# Patient Record
Sex: Female | Born: 1945
Health system: Southern US, Community
[De-identification: ages and names within clinical notes are randomized; demographics above are authoritative.]

## PROBLEM LIST (undated history)

## (undated) ENCOUNTER — Inpatient Hospital Stay: Payer: Self-pay

## (undated) DIAGNOSIS — M1710 Unilateral primary osteoarthritis, unspecified knee: Secondary | ICD-10-CM

## (undated) DIAGNOSIS — T4145XA Adverse effect of unspecified anesthetic, initial encounter: Secondary | ICD-10-CM

## (undated) DIAGNOSIS — J45909 Unspecified asthma, uncomplicated: Secondary | ICD-10-CM

## (undated) DIAGNOSIS — F3289 Other specified depressive episodes: Secondary | ICD-10-CM

## (undated) DIAGNOSIS — Z8719 Personal history of other diseases of the digestive system: Secondary | ICD-10-CM

## (undated) DIAGNOSIS — E785 Hyperlipidemia, unspecified: Secondary | ICD-10-CM

## (undated) DIAGNOSIS — F419 Anxiety disorder, unspecified: Secondary | ICD-10-CM

## (undated) DIAGNOSIS — K279 Peptic ulcer, site unspecified, unspecified as acute or chronic, without hemorrhage or perforation: Secondary | ICD-10-CM

## (undated) DIAGNOSIS — C801 Malignant (primary) neoplasm, unspecified: Secondary | ICD-10-CM

## (undated) DIAGNOSIS — M19049 Primary osteoarthritis, unspecified hand: Secondary | ICD-10-CM

## (undated) DIAGNOSIS — IMO0001 Reserved for inherently not codable concepts without codable children: Secondary | ICD-10-CM

## (undated) DIAGNOSIS — J309 Allergic rhinitis, unspecified: Secondary | ICD-10-CM

## (undated) DIAGNOSIS — H8110 Benign paroxysmal vertigo, unspecified ear: Secondary | ICD-10-CM

## (undated) DIAGNOSIS — I509 Heart failure, unspecified: Secondary | ICD-10-CM

## (undated) DIAGNOSIS — R7303 Prediabetes: Secondary | ICD-10-CM

## (undated) DIAGNOSIS — E119 Type 2 diabetes mellitus without complications: Secondary | ICD-10-CM

## (undated) DIAGNOSIS — K219 Gastro-esophageal reflux disease without esophagitis: Secondary | ICD-10-CM

## (undated) DIAGNOSIS — D649 Anemia, unspecified: Secondary | ICD-10-CM

## (undated) DIAGNOSIS — M26609 Unspecified temporomandibular joint disorder, unspecified side: Secondary | ICD-10-CM

## (undated) DIAGNOSIS — I10 Essential (primary) hypertension: Secondary | ICD-10-CM

## (undated) DIAGNOSIS — E039 Hypothyroidism, unspecified: Secondary | ICD-10-CM

## (undated) DIAGNOSIS — J189 Pneumonia, unspecified organism: Secondary | ICD-10-CM

## (undated) DIAGNOSIS — F329 Major depressive disorder, single episode, unspecified: Secondary | ICD-10-CM

## (undated) DIAGNOSIS — J383 Other diseases of vocal cords: Secondary | ICD-10-CM

## (undated) DIAGNOSIS — T8859XA Other complications of anesthesia, initial encounter: Secondary | ICD-10-CM

## (undated) HISTORY — PX: BREAST EXCISIONAL BIOPSY: SUR124

## (undated) HISTORY — PX: KNEE ARTHROSCOPY: SUR90

## (undated) HISTORY — DX: Benign paroxysmal vertigo, unspecified ear: H81.10

## (undated) HISTORY — DX: Gastro-esophageal reflux disease without esophagitis: K21.9

## (undated) HISTORY — DX: Unilateral primary osteoarthritis, unspecified knee: M17.10

## (undated) HISTORY — PX: NASAL SINUS SURGERY: SHX719

## (undated) HISTORY — PX: OTHER SURGICAL HISTORY: SHX169

## (undated) HISTORY — PX: ROTATOR CUFF REPAIR: SHX139

## (undated) HISTORY — DX: Hypothyroidism, unspecified: E03.9

## (undated) HISTORY — DX: Major depressive disorder, single episode, unspecified: F32.9

## (undated) HISTORY — DX: Primary osteoarthritis, unspecified hand: M19.049

## (undated) HISTORY — DX: Allergic rhinitis, unspecified: J30.9

## (undated) HISTORY — DX: Unspecified asthma, uncomplicated: J45.909

## (undated) HISTORY — DX: Other diseases of vocal cords: J38.3

## (undated) HISTORY — PX: BREAST SURGERY: SHX581

## (undated) HISTORY — DX: Peptic ulcer, site unspecified, unspecified as acute or chronic, without hemorrhage or perforation: K27.9

## (undated) HISTORY — DX: Other specified depressive episodes: F32.89

## (undated) HISTORY — DX: Hyperlipidemia, unspecified: E78.5

---
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
## 1997-07-05 ENCOUNTER — Other Ambulatory Visit: Admission: RE | Admit: 1997-07-05 | Discharge: 1997-07-05 | Payer: Self-pay | Admitting: Obstetrics and Gynecology

## 1997-08-02 ENCOUNTER — Other Ambulatory Visit: Admission: RE | Admit: 1997-08-02 | Discharge: 1997-08-02 | Payer: Self-pay | Admitting: Obstetrics and Gynecology

## 1998-06-08 ENCOUNTER — Ambulatory Visit (HOSPITAL_COMMUNITY): Admission: RE | Admit: 1998-06-08 | Discharge: 1998-06-08 | Payer: Self-pay | Admitting: *Deleted

## 1998-11-29 ENCOUNTER — Ambulatory Visit (HOSPITAL_COMMUNITY): Admission: RE | Admit: 1998-11-29 | Discharge: 1998-11-29 | Payer: Self-pay | Admitting: Obstetrics and Gynecology

## 1998-11-29 ENCOUNTER — Encounter: Payer: Self-pay | Admitting: Obstetrics and Gynecology

## 1999-08-07 ENCOUNTER — Encounter: Payer: Self-pay | Admitting: Obstetrics and Gynecology

## 1999-08-07 ENCOUNTER — Ambulatory Visit (HOSPITAL_COMMUNITY): Admission: RE | Admit: 1999-08-07 | Discharge: 1999-08-07 | Payer: Self-pay | Admitting: Obstetrics and Gynecology

## 1999-10-20 ENCOUNTER — Ambulatory Visit (HOSPITAL_COMMUNITY): Admission: RE | Admit: 1999-10-20 | Discharge: 1999-10-20 | Payer: Self-pay | Admitting: Gastroenterology

## 1999-12-26 ENCOUNTER — Ambulatory Visit (HOSPITAL_COMMUNITY): Admission: RE | Admit: 1999-12-26 | Discharge: 1999-12-26 | Payer: Self-pay | Admitting: Gastroenterology

## 2000-08-26 ENCOUNTER — Ambulatory Visit (HOSPITAL_COMMUNITY): Admission: RE | Admit: 2000-08-26 | Discharge: 2000-08-26 | Payer: Self-pay | Admitting: Obstetrics and Gynecology

## 2000-08-26 ENCOUNTER — Encounter: Payer: Self-pay | Admitting: Obstetrics and Gynecology

## 2001-07-24 ENCOUNTER — Encounter: Admission: RE | Admit: 2001-07-24 | Discharge: 2001-07-24 | Payer: Self-pay | Admitting: Critical Care Medicine

## 2001-07-24 ENCOUNTER — Encounter: Payer: Self-pay | Admitting: Critical Care Medicine

## 2002-03-31 ENCOUNTER — Encounter: Payer: Self-pay | Admitting: Obstetrics and Gynecology

## 2002-03-31 ENCOUNTER — Ambulatory Visit (HOSPITAL_COMMUNITY): Admission: RE | Admit: 2002-03-31 | Discharge: 2002-03-31 | Payer: Self-pay | Admitting: Obstetrics and Gynecology

## 2002-04-03 ENCOUNTER — Encounter: Payer: Self-pay | Admitting: Obstetrics and Gynecology

## 2002-04-03 ENCOUNTER — Encounter: Admission: RE | Admit: 2002-04-03 | Discharge: 2002-04-03 | Payer: Self-pay | Admitting: Obstetrics and Gynecology

## 2002-05-19 ENCOUNTER — Encounter: Payer: Self-pay | Admitting: Internal Medicine

## 2002-06-15 ENCOUNTER — Encounter: Payer: Self-pay | Admitting: Internal Medicine

## 2002-06-15 ENCOUNTER — Encounter: Payer: Self-pay | Admitting: Cardiology

## 2003-04-13 ENCOUNTER — Ambulatory Visit (HOSPITAL_COMMUNITY): Admission: RE | Admit: 2003-04-13 | Discharge: 2003-04-13 | Payer: Self-pay | Admitting: Obstetrics and Gynecology

## 2004-01-17 ENCOUNTER — Ambulatory Visit: Payer: Self-pay | Admitting: Critical Care Medicine

## 2004-02-09 ENCOUNTER — Ambulatory Visit: Payer: Self-pay | Admitting: Critical Care Medicine

## 2004-02-24 ENCOUNTER — Ambulatory Visit: Payer: Self-pay | Admitting: Critical Care Medicine

## 2004-04-06 ENCOUNTER — Ambulatory Visit: Payer: Self-pay | Admitting: Critical Care Medicine

## 2004-04-26 ENCOUNTER — Ambulatory Visit: Payer: Self-pay | Admitting: Critical Care Medicine

## 2004-04-28 ENCOUNTER — Ambulatory Visit: Payer: Self-pay | Admitting: Internal Medicine

## 2004-05-09 ENCOUNTER — Ambulatory Visit: Payer: Self-pay | Admitting: Internal Medicine

## 2004-05-15 ENCOUNTER — Ambulatory Visit: Payer: Self-pay | Admitting: Internal Medicine

## 2004-08-02 ENCOUNTER — Ambulatory Visit: Payer: Self-pay | Admitting: Critical Care Medicine

## 2004-09-26 ENCOUNTER — Ambulatory Visit: Payer: Self-pay | Admitting: Critical Care Medicine

## 2004-11-08 ENCOUNTER — Ambulatory Visit (HOSPITAL_COMMUNITY): Admission: RE | Admit: 2004-11-08 | Discharge: 2004-11-08 | Payer: Self-pay | Admitting: Obstetrics and Gynecology

## 2004-11-09 ENCOUNTER — Ambulatory Visit: Payer: Self-pay | Admitting: Critical Care Medicine

## 2004-12-13 ENCOUNTER — Ambulatory Visit: Payer: Self-pay | Admitting: Critical Care Medicine

## 2004-12-15 ENCOUNTER — Encounter: Admission: RE | Admit: 2004-12-15 | Discharge: 2004-12-15 | Payer: Self-pay | Admitting: Internal Medicine

## 2004-12-28 ENCOUNTER — Ambulatory Visit: Payer: Self-pay | Admitting: Critical Care Medicine

## 2005-01-09 ENCOUNTER — Ambulatory Visit: Payer: Self-pay | Admitting: Pulmonary Disease

## 2005-01-22 ENCOUNTER — Ambulatory Visit: Payer: Self-pay | Admitting: Critical Care Medicine

## 2005-01-31 ENCOUNTER — Ambulatory Visit: Payer: Self-pay | Admitting: Critical Care Medicine

## 2005-02-16 ENCOUNTER — Ambulatory Visit: Payer: Self-pay | Admitting: Critical Care Medicine

## 2005-02-20 ENCOUNTER — Ambulatory Visit: Payer: Self-pay | Admitting: Cardiology

## 2005-04-11 ENCOUNTER — Ambulatory Visit: Payer: Self-pay | Admitting: Critical Care Medicine

## 2005-06-06 ENCOUNTER — Encounter: Payer: Self-pay | Admitting: Internal Medicine

## 2005-06-15 ENCOUNTER — Ambulatory Visit: Payer: Self-pay | Admitting: Critical Care Medicine

## 2005-06-22 ENCOUNTER — Encounter: Payer: Self-pay | Admitting: Internal Medicine

## 2005-06-27 ENCOUNTER — Encounter: Admission: RE | Admit: 2005-06-27 | Discharge: 2005-06-27 | Payer: Self-pay | Admitting: Internal Medicine

## 2005-06-28 ENCOUNTER — Ambulatory Visit: Payer: Self-pay | Admitting: Internal Medicine

## 2005-08-22 ENCOUNTER — Ambulatory Visit: Payer: Self-pay | Admitting: Critical Care Medicine

## 2005-08-31 ENCOUNTER — Ambulatory Visit: Payer: Self-pay | Admitting: Internal Medicine

## 2005-10-16 ENCOUNTER — Encounter: Payer: Self-pay | Admitting: Internal Medicine

## 2005-10-26 ENCOUNTER — Ambulatory Visit: Payer: Self-pay | Admitting: Critical Care Medicine

## 2005-11-07 ENCOUNTER — Ambulatory Visit: Payer: Self-pay | Admitting: Internal Medicine

## 2005-11-27 ENCOUNTER — Ambulatory Visit (HOSPITAL_COMMUNITY): Admission: RE | Admit: 2005-11-27 | Discharge: 2005-11-27 | Payer: Self-pay | Admitting: Obstetrics and Gynecology

## 2006-02-11 ENCOUNTER — Ambulatory Visit: Payer: Self-pay | Admitting: Internal Medicine

## 2006-03-09 ENCOUNTER — Ambulatory Visit: Payer: Self-pay | Admitting: Family Medicine

## 2006-03-12 ENCOUNTER — Ambulatory Visit: Payer: Self-pay | Admitting: Critical Care Medicine

## 2006-03-26 ENCOUNTER — Ambulatory Visit: Payer: Self-pay | Admitting: Critical Care Medicine

## 2006-05-06 ENCOUNTER — Encounter: Payer: Self-pay | Admitting: Internal Medicine

## 2006-05-10 ENCOUNTER — Encounter: Payer: Self-pay | Admitting: Internal Medicine

## 2006-06-17 ENCOUNTER — Ambulatory Visit: Payer: Self-pay | Admitting: Critical Care Medicine

## 2006-09-18 ENCOUNTER — Ambulatory Visit: Payer: Self-pay | Admitting: Internal Medicine

## 2006-09-18 LAB — CONVERTED CEMR LAB
ALT: 36 units/L — ABNORMAL HIGH (ref 0–35)
AST: 21 units/L (ref 0–37)
Albumin: 3.6 g/dL (ref 3.5–5.2)
Alkaline Phosphatase: 58 units/L (ref 39–117)
BUN: 16 mg/dL (ref 6–23)
Basophils Absolute: 0 10*3/uL (ref 0.0–0.1)
Basophils Relative: 0.9 % (ref 0.0–1.0)
Bilirubin Urine: NEGATIVE
Bilirubin, Direct: 0.1 mg/dL (ref 0.0–0.3)
Blood in Urine, dipstick: NEGATIVE
CO2: 31 meq/L (ref 19–32)
Calcium: 8.9 mg/dL (ref 8.4–10.5)
Chloride: 105 meq/L (ref 96–112)
Cholesterol: 223 mg/dL (ref 0–200)
Creatinine, Ser: 0.7 mg/dL (ref 0.4–1.2)
Direct LDL: 146.7 mg/dL
Eosinophils Absolute: 0.3 10*3/uL (ref 0.0–0.6)
Eosinophils Relative: 6 % — ABNORMAL HIGH (ref 0.0–5.0)
GFR calc Af Amer: 109 mL/min
GFR calc non Af Amer: 90 mL/min
Glucose, Bld: 99 mg/dL (ref 70–99)
Glucose, Urine, Semiquant: NEGATIVE
HCT: 37.6 % (ref 36.0–46.0)
HDL: 62.4 mg/dL (ref >39.0)
Hemoglobin: 13 g/dL (ref 12.0–15.0)
Ketones, urine, test strip: NEGATIVE
Lymphocytes Relative: 22.9 % (ref 12.0–46.0)
MCHC: 34.6 g/dL (ref 30.0–36.0)
MCV: 83.7 fL (ref 78.0–100.0)
Monocytes Absolute: 0.6 10*3/uL (ref 0.2–0.7)
Monocytes Relative: 12.5 % — ABNORMAL HIGH (ref 3.0–11.0)
Neutro Abs: 3.1 10*3/uL (ref 1.4–7.7)
Neutrophils Relative %: 57.7 % (ref 43.0–77.0)
Nitrite: NEGATIVE
Platelets: 321 10*3/uL (ref 150–400)
Potassium: 4.1 meq/L (ref 3.5–5.1)
RBC: 4.49 M/uL (ref 3.87–5.11)
RDW: 13.1 % (ref 11.5–14.6)
Sodium: 141 meq/L (ref 135–145)
Specific Gravity, Urine: 1.015
TSH: 0.53 microintl units/mL (ref 0.35–5.50)
Total Bilirubin: 0.7 mg/dL (ref 0.3–1.2)
Total CHOL/HDL Ratio: 3.6
Total Protein: 6.3 g/dL (ref 6.0–8.3)
Triglycerides: 82 mg/dL (ref 0–149)
Urobilinogen, UA: 0.2
VLDL: 16 mg/dL (ref 0–40)
WBC: 5.2 10*3/uL (ref 4.5–10.5)
pH: 8.5

## 2006-09-23 DIAGNOSIS — E785 Hyperlipidemia, unspecified: Secondary | ICD-10-CM

## 2006-09-24 ENCOUNTER — Ambulatory Visit: Payer: Self-pay | Admitting: Internal Medicine

## 2006-09-24 DIAGNOSIS — F329 Major depressive disorder, single episode, unspecified: Secondary | ICD-10-CM

## 2006-09-24 DIAGNOSIS — F330 Major depressive disorder, recurrent, mild: Secondary | ICD-10-CM | POA: Insufficient documentation

## 2006-09-24 DIAGNOSIS — H8110 Benign paroxysmal vertigo, unspecified ear: Secondary | ICD-10-CM

## 2006-09-24 DIAGNOSIS — E039 Hypothyroidism, unspecified: Secondary | ICD-10-CM

## 2006-09-24 DIAGNOSIS — K219 Gastro-esophageal reflux disease without esophagitis: Secondary | ICD-10-CM

## 2006-10-16 ENCOUNTER — Encounter: Admission: RE | Admit: 2006-10-16 | Discharge: 2007-01-14 | Payer: Self-pay | Admitting: Internal Medicine

## 2006-10-16 ENCOUNTER — Encounter: Payer: Self-pay | Admitting: Internal Medicine

## 2006-11-04 ENCOUNTER — Ambulatory Visit: Payer: Self-pay | Admitting: Critical Care Medicine

## 2006-11-05 DIAGNOSIS — K279 Peptic ulcer, site unspecified, unspecified as acute or chronic, without hemorrhage or perforation: Secondary | ICD-10-CM | POA: Insufficient documentation

## 2006-11-05 DIAGNOSIS — J309 Allergic rhinitis, unspecified: Secondary | ICD-10-CM

## 2006-11-05 DIAGNOSIS — J383 Other diseases of vocal cords: Secondary | ICD-10-CM

## 2006-11-05 DIAGNOSIS — J45909 Unspecified asthma, uncomplicated: Secondary | ICD-10-CM

## 2006-11-06 ENCOUNTER — Encounter: Payer: Self-pay | Admitting: Internal Medicine

## 2006-11-07 ENCOUNTER — Encounter: Payer: Self-pay | Admitting: Internal Medicine

## 2006-11-20 ENCOUNTER — Telehealth (INDEPENDENT_AMBULATORY_CARE_PROVIDER_SITE_OTHER): Payer: Self-pay | Admitting: *Deleted

## 2006-11-25 ENCOUNTER — Encounter: Payer: Self-pay | Admitting: Internal Medicine

## 2006-11-29 ENCOUNTER — Telehealth: Payer: Self-pay | Admitting: Internal Medicine

## 2006-12-03 ENCOUNTER — Ambulatory Visit: Payer: Self-pay | Admitting: Internal Medicine

## 2006-12-03 LAB — CONVERTED CEMR LAB
HCV Ab: NEGATIVE
Hep A IgM: NEGATIVE
Hep B C IgM: NEGATIVE
Hepatitis B Surface Ag: NEGATIVE

## 2006-12-05 ENCOUNTER — Telehealth: Payer: Self-pay | Admitting: *Deleted

## 2006-12-11 ENCOUNTER — Ambulatory Visit (HOSPITAL_COMMUNITY): Admission: RE | Admit: 2006-12-11 | Discharge: 2006-12-11 | Payer: Self-pay | Admitting: Obstetrics and Gynecology

## 2007-01-20 ENCOUNTER — Ambulatory Visit: Payer: Self-pay | Admitting: Critical Care Medicine

## 2007-02-03 ENCOUNTER — Telehealth: Payer: Self-pay | Admitting: Internal Medicine

## 2007-02-03 ENCOUNTER — Telehealth (INDEPENDENT_AMBULATORY_CARE_PROVIDER_SITE_OTHER): Payer: Self-pay | Admitting: *Deleted

## 2007-02-10 ENCOUNTER — Telehealth (INDEPENDENT_AMBULATORY_CARE_PROVIDER_SITE_OTHER): Payer: Self-pay | Admitting: *Deleted

## 2007-02-10 ENCOUNTER — Ambulatory Visit: Payer: Self-pay | Admitting: Pulmonary Disease

## 2007-02-17 ENCOUNTER — Telehealth (INDEPENDENT_AMBULATORY_CARE_PROVIDER_SITE_OTHER): Payer: Self-pay | Admitting: *Deleted

## 2007-02-17 ENCOUNTER — Ambulatory Visit: Payer: Self-pay | Admitting: Critical Care Medicine

## 2007-03-05 ENCOUNTER — Telehealth: Payer: Self-pay | Admitting: Internal Medicine

## 2007-03-11 ENCOUNTER — Encounter: Payer: Self-pay | Admitting: Internal Medicine

## 2007-03-19 ENCOUNTER — Ambulatory Visit: Payer: Self-pay | Admitting: Internal Medicine

## 2007-03-19 DIAGNOSIS — M1710 Unilateral primary osteoarthritis, unspecified knee: Secondary | ICD-10-CM | POA: Insufficient documentation

## 2007-04-12 ENCOUNTER — Ambulatory Visit: Payer: Self-pay | Admitting: Family Medicine

## 2007-05-06 ENCOUNTER — Ambulatory Visit: Payer: Self-pay | Admitting: Critical Care Medicine

## 2007-05-07 ENCOUNTER — Telehealth (INDEPENDENT_AMBULATORY_CARE_PROVIDER_SITE_OTHER): Payer: Self-pay | Admitting: *Deleted

## 2007-06-10 ENCOUNTER — Encounter: Payer: Self-pay | Admitting: Internal Medicine

## 2007-06-12 ENCOUNTER — Telehealth (INDEPENDENT_AMBULATORY_CARE_PROVIDER_SITE_OTHER): Payer: Self-pay | Admitting: *Deleted

## 2007-06-16 ENCOUNTER — Telehealth (INDEPENDENT_AMBULATORY_CARE_PROVIDER_SITE_OTHER): Payer: Self-pay | Admitting: *Deleted

## 2007-07-22 ENCOUNTER — Telehealth: Payer: Self-pay | Admitting: Internal Medicine

## 2007-08-12 ENCOUNTER — Ambulatory Visit: Payer: Self-pay | Admitting: Critical Care Medicine

## 2007-08-13 ENCOUNTER — Encounter: Payer: Self-pay | Admitting: Critical Care Medicine

## 2007-10-07 ENCOUNTER — Ambulatory Visit: Payer: Self-pay | Admitting: Critical Care Medicine

## 2007-12-02 ENCOUNTER — Ambulatory Visit: Payer: Self-pay | Admitting: Critical Care Medicine

## 2007-12-16 ENCOUNTER — Ambulatory Visit (HOSPITAL_COMMUNITY): Admission: RE | Admit: 2007-12-16 | Discharge: 2007-12-16 | Payer: Self-pay | Admitting: Obstetrics and Gynecology

## 2007-12-22 ENCOUNTER — Ambulatory Visit: Payer: Self-pay | Admitting: Internal Medicine

## 2007-12-22 DIAGNOSIS — M19049 Primary osteoarthritis, unspecified hand: Secondary | ICD-10-CM | POA: Insufficient documentation

## 2007-12-26 ENCOUNTER — Telehealth: Payer: Self-pay | Admitting: Internal Medicine

## 2007-12-26 LAB — CONVERTED CEMR LAB
CRP, High Sensitivity: 17 — ABNORMAL HIGH (ref 0.00–5.00)
Cyclic Citrullin Peptide Ab: 0.7 units (ref <7)

## 2008-01-21 ENCOUNTER — Encounter: Payer: Self-pay | Admitting: Critical Care Medicine

## 2008-01-21 ENCOUNTER — Encounter: Payer: Self-pay | Admitting: Internal Medicine

## 2008-05-03 ENCOUNTER — Encounter: Payer: Self-pay | Admitting: Internal Medicine

## 2008-05-13 ENCOUNTER — Ambulatory Visit: Payer: Self-pay | Admitting: Critical Care Medicine

## 2008-06-17 ENCOUNTER — Ambulatory Visit: Payer: Self-pay | Admitting: Family Medicine

## 2008-06-23 ENCOUNTER — Encounter: Payer: Self-pay | Admitting: Cardiology

## 2008-06-23 ENCOUNTER — Ambulatory Visit: Payer: Self-pay | Admitting: Cardiology

## 2008-06-23 DIAGNOSIS — R072 Precordial pain: Secondary | ICD-10-CM | POA: Insufficient documentation

## 2008-06-24 ENCOUNTER — Encounter: Payer: Self-pay | Admitting: Cardiology

## 2008-07-07 ENCOUNTER — Encounter: Payer: Self-pay | Admitting: Cardiology

## 2008-07-07 ENCOUNTER — Ambulatory Visit: Payer: Self-pay

## 2008-08-03 ENCOUNTER — Encounter: Payer: Self-pay | Admitting: Family Medicine

## 2008-08-03 ENCOUNTER — Encounter: Payer: Self-pay | Admitting: Critical Care Medicine

## 2008-08-11 ENCOUNTER — Encounter: Payer: Self-pay | Admitting: Cardiology

## 2008-08-11 ENCOUNTER — Ambulatory Visit: Payer: Self-pay | Admitting: Cardiology

## 2008-11-30 ENCOUNTER — Encounter: Admission: RE | Admit: 2008-11-30 | Discharge: 2008-11-30 | Payer: Self-pay | Admitting: Family Medicine

## 2008-11-30 ENCOUNTER — Ambulatory Visit: Payer: Self-pay | Admitting: Family Medicine

## 2008-11-30 DIAGNOSIS — M059 Rheumatoid arthritis with rheumatoid factor, unspecified: Secondary | ICD-10-CM

## 2008-11-30 DIAGNOSIS — Z780 Asymptomatic menopausal state: Secondary | ICD-10-CM | POA: Insufficient documentation

## 2008-12-17 ENCOUNTER — Telehealth: Payer: Self-pay | Admitting: Family Medicine

## 2008-12-20 ENCOUNTER — Encounter: Admission: RE | Admit: 2008-12-20 | Discharge: 2008-12-20 | Payer: Self-pay | Admitting: Family Medicine

## 2008-12-21 ENCOUNTER — Ambulatory Visit: Payer: Self-pay | Admitting: Critical Care Medicine

## 2008-12-28 ENCOUNTER — Encounter: Payer: Self-pay | Admitting: Family Medicine

## 2008-12-29 ENCOUNTER — Telehealth: Payer: Self-pay | Admitting: Family Medicine

## 2009-01-19 ENCOUNTER — Ambulatory Visit: Payer: Self-pay | Admitting: Critical Care Medicine

## 2009-01-19 DIAGNOSIS — J0100 Acute maxillary sinusitis, unspecified: Secondary | ICD-10-CM | POA: Insufficient documentation

## 2009-01-21 ENCOUNTER — Telehealth: Payer: Self-pay | Admitting: Pulmonary Disease

## 2009-01-26 ENCOUNTER — Encounter: Payer: Self-pay | Admitting: Family Medicine

## 2009-01-26 ENCOUNTER — Encounter: Payer: Self-pay | Admitting: Critical Care Medicine

## 2009-01-31 ENCOUNTER — Ambulatory Visit: Payer: Self-pay | Admitting: Critical Care Medicine

## 2009-02-01 ENCOUNTER — Encounter: Payer: Self-pay | Admitting: Critical Care Medicine

## 2009-02-14 ENCOUNTER — Ambulatory Visit: Payer: Self-pay | Admitting: Critical Care Medicine

## 2009-02-14 ENCOUNTER — Telehealth: Payer: Self-pay | Admitting: Critical Care Medicine

## 2009-04-13 ENCOUNTER — Ambulatory Visit: Payer: Self-pay | Admitting: Family Medicine

## 2009-04-13 DIAGNOSIS — H6980 Other specified disorders of Eustachian tube, unspecified ear: Secondary | ICD-10-CM

## 2009-04-13 LAB — CONVERTED CEMR LAB: Rapid Strep: NEGATIVE

## 2009-04-14 ENCOUNTER — Telehealth: Payer: Self-pay | Admitting: Family Medicine

## 2009-04-26 ENCOUNTER — Encounter: Payer: Self-pay | Admitting: Family Medicine

## 2009-08-29 ENCOUNTER — Encounter: Payer: Self-pay | Admitting: Critical Care Medicine

## 2009-11-25 ENCOUNTER — Ambulatory Visit: Payer: Self-pay | Admitting: Family Medicine

## 2009-11-25 DIAGNOSIS — R404 Transient alteration of awareness: Secondary | ICD-10-CM

## 2009-12-05 ENCOUNTER — Telehealth: Payer: Self-pay | Admitting: Adult Health

## 2009-12-12 ENCOUNTER — Encounter: Payer: Self-pay | Admitting: Family Medicine

## 2009-12-26 ENCOUNTER — Encounter
Admission: RE | Admit: 2009-12-26 | Discharge: 2009-12-26 | Payer: Self-pay | Source: Home / Self Care | Admitting: Family Medicine

## 2009-12-26 ENCOUNTER — Ambulatory Visit: Payer: Self-pay | Admitting: Family Medicine

## 2010-01-24 ENCOUNTER — Ambulatory Visit: Payer: Self-pay | Admitting: Critical Care Medicine

## 2010-02-17 ENCOUNTER — Telehealth: Payer: Self-pay | Admitting: Critical Care Medicine

## 2010-02-27 ENCOUNTER — Encounter: Payer: Self-pay | Admitting: Family Medicine

## 2010-02-28 ENCOUNTER — Encounter
Admission: RE | Admit: 2010-02-28 | Discharge: 2010-02-28 | Payer: Self-pay | Source: Home / Self Care | Attending: Obstetrics and Gynecology | Admitting: Obstetrics and Gynecology

## 2010-03-05 ENCOUNTER — Encounter: Payer: Self-pay | Admitting: Critical Care Medicine

## 2010-03-14 NOTE — Letter (Signed)
Summary: Office Note/[facility]  Office Note/[facility]   Imported By: Sherian Rein 02/24/2009 13:52:12  _____________________________________________________________________  External Attachment:    Type:   Image     Comment:   External Document

## 2010-03-14 NOTE — Assessment & Plan Note (Signed)
Summary: cough and congestion//jrc   Primary Provider/Referring Provider:  Seymour Bars DO  CC:  Acute Visit.  c/o dry cough, low grade fever, chest congestion, chest tightness, fatigue, and crackling in left ear when swollowing/blowng nose.  states she has been having these sxs since begining of Dec, and sxs started to improve but started to come back on Saturday.Marland Kitchen  History of Present Illness:  This is a 65 year old, white female with chronic allergic rhinitis and extrinsic asthma.  The patient also has associated vocal cord dysfunction syndrome and reflux disease.   December 21, 2008 3:06 PM since last ov  difficulty in the early fall,  ok in the summer Recently had sinus drainage and cough.  Had thick white nasal mucous that is now better   January 19, 2009 11:42 AM Became ill on the trip [place],  was in [place] first and dust exposure and dry heat.  Would wake up sneezing.  then more cough and ears stopped up.  Now cannot hear.  Severe pain on the flight.  Very cold and snow and rain.  Now mucous is productive and chunks.  This is deep yellow.  Started pred and avelox.  (5days course)  8 day course,  no fever but has chills  January 31, 2009 --Presents for 2 week follow up: SOB is unchanged, dry hacking cough, increased fatigue with sleeping in excess of 8-10hrs per day.  finished abx and pred taper. Last visit tx w/ avelox and prednisone taper. Sinus congestion is better but still stuffy.  Denies chest pain,  orthopnea, hemoptysis, fever, n/v/d, edema, headache, no discolored mucus.   February 14, 2009 --Returns for persistent symptoms. Complains of dry cough, low grade fever, chest congestion, chest tightness, fatigue, and crackling in left ear when swollowing/blowng nose.  states she has been having these sxs since begining of Dec, sxs started to improve but started to come back on Saturday. Symptoms did improve quite a bit, but then over last 2 days, cough, congestion started back.  Medications are not working. Denies chest pain, dyspnea, orthopnea, hemoptysis, fever, n/v/d, edema, headache. Cough is mainly dry with no discolored mucus.   Current Medications (verified): 1)  Metrogel 1 % Gel (Metronidazole) .... As Directed 2)  Pulmicort Flexhaler 180 Mcg/act Inha (Budesonide (Inhalation)) .... 2 Puffs  Twice Daily 3)  Wellbutrin Xl 150 Mg  Tb24 (Bupropion Hcl) .... Take 1 Tablet By Mouth Once A Day 4)  Klonopin 0.5 Mg  Tabs (Clonazepam) .... 1/2 Tab At Bedtime Only 5)  Paxil Cr 12.5 Mg  Tb24 (Paroxetine Hcl) .... Take 1 Tablet By Mouth Once A Day 6)  Flonase 50 Mcg/act  Susp (Fluticasone Propionate) .... Two Puff Each Nostril Daily 7)  Glucosamine-Chondroitin 1500-1200 Mg/66ml  Liqd (Glucosamine-Chondroitin) .... One Tab Once Daily 8)  Zegerid 40-1100 Mg  Caps (Omeprazole-Sodium Bicarbonate) .... Once Daily 9)  Folic Acid 1 Mg  Tabs (Folic Acid) .Marland Kitchen.. 1 Tab By Mouth Two Times A Day 10)  Proair Hfa 108 (90 Base) Mcg/act  Aers (Albuterol Sulfate) .Marland Kitchen.. 1-2 Puffs Every 4-6 Hours As Needed 11)  Mucinex Dm Maximum Strength 60-1200 Mg Xr12h-Tab (Dextromethorphan-Guaifenesin) .... Two Times A Day 12)  Vitamins  D 5000 Unit Caps .Marland Kitchen.. 1 By Mouth Daily 13)  Fish Oil Concentrate 1000 Mg Caps (Omega-3 Fatty Acids) .... Once Daily  ***on Hold*** 14)  Synthroid 88 Mcg Tabs (Levothyroxine Sodium) .... Take Only On Sunday 15)  Synthroid 75 Mcg Tabs (Levothyroxine Sodium) .Marland Kitchen.. 1 By  Mouth Daily Except For "Sunday 16)  Vemma Mv Drink .... Daily 17)  Calcium Carbonate-Vitamin D 600-400 Mg-Unit  Tabs (Calcium Carbonate-Vitamin D) .... Three Times A Day 18)  Ginkgo Biloba   Extr (Ginkgo Biloba) .... 2 Two Times A Day 19)  Alj - Natural Food Pill .... 4 Daily As Needed   *** On Hold  *** 20)  Vitamin C 500 Mg  Tabs (Ascorbic Acid) .... Take 1 Tablet By Mouth Two Times A Day' 21)  Biotin 5000 5 Mg Caps (Biotin) .... Take 1 Tablet By Mouth Once A Day 22)  Menostar 14 Mcg/24hr Ptwk (Estradiol) ....  One Patch Weekly 23)  Tessalon 200 Mg Caps (Benzonatate) .... 1 By Mouth Three Times A Day As Needed Cough 24)  Naproxen .... Two Times A Day  Allergies (verified): 1)  ! Morphine 2)  ! * Avelox  Past History:  Past Medical History: Last updated: 11/30/2008 Current Problems:  HYPERLIPIDEMIA (ICD-272.4) ARTHRITIS, HANDS, BILATERAL (ICD-716.94) ASTHMATIC BRONCHITIS, ACUTE (ICD-466.0) PEPTIC ULCER DISEASE (ICD-533.90) ALLERGIC RHINITIS (ICD-477.9) ASTHMA (ICD-493.90) VERTIGO, BENIGN PAROXYSMAL POSITION (ICD-386.11) DEPRESSION (ICD-311) GERD (ICD-530.81) HYPOTHYROIDISM (ICD-244.9) LOC OSTEOARTHROS NOT SPEC PRIM/SEC LOWER LEG (ICD-715.36) VOCAL CORD DISORDER (ICD-478.5) Nephrolithiasis  Dr Fletcher -- gyn  Past Surgical History: Last updated: 06/23/2008 torn meniscus in knees  ( athroscopy) sinus surgery colonoscopy rotator cuff R shoulder 07/10/07  Panendoscopy. Benign tumor removed from thumb  Family History: Last updated: 06/23/2008 father died of myocardial infarction (first MI at age 50) mother had colon cancer in her 80s, aortic stenosis Family History of Colon CA 1st degree relative <60 brother died of asthma at 60. 1 brother died of liver cancer, ETOH 3 older brothers living  Social History: Last updated: 06/22/2008 Married.  Has 3 kids.  Oldest daughter in [place].  Son in [place] with 3 kids.  youngest daughter lives with her.  Retired teacher and nurses aid. Quit smoking in the 1960s. 2 glasses wine/ wk. Works out once a wk  Risk Factors: Smoking Status: never (01/19/2009)  Past Pulmonary History:  Pulmonary History: Moderate persistent asthma   -FeV1 90% Fef 25-75 60% 2008 allergic rhinitis  Review of Systems      See HPI  Vital Signs:  Patient profile:   65 year old female Height:      63 inches Weight:      170.38 pounds BMI:     30.29 O2 Sat:      97 % on Room air Temp:     99" .0 degrees F oral Pulse rate:   70 / minute BP  sitting:   138 / 90  (left arm) Cuff size:   regular  Vitals Entered By: Gweneth Dimitri RN (February 14, 2009 3:28 PM)  O2 Flow:  Room air CC: Acute Visit.  c/o dry cough, low grade fever, chest congestion, chest tightness, fatigue, and crackling in left ear when swollowing/blowng nose.  states she has been having these sxs since begining of Dec, sxs started to improve but started to come back on Saturday. Is Patient Diabetic? No Comments Medications reviewed with patient phone number verified with pt Gweneth Dimitri RN  February 14, 2009 3:29 PM    Physical Exam  Additional Exam:  Gen: Pleasant, well nourished, in no distress ENT: mild redness nontender sinus  Neck: No JVD, no TMG, no carotid bruits Lungs: coarse BS w /no wheeizng, harsh sounding cough  Cardiovascular: RRR, heart sounds normal, no murmurs or gallops, no peripheral edema Musculoskeletal: No deformities,  no cyanosis or clubbing     CXR  Procedure date:  02/14/2009  Findings:      IMPRESSION: No active disease.  No significant change.  Impression & Recommendations:  Problem # 1:  ASTHMATIC BRONCHITIS, ACUTE (ICD-466.0)  CXR today reviewed on image cast w/ pt w/ no acute changes. noted.  Slow to resolve flare w/ upper airway cough  REC:  Add Zyrtec 10mg  at bedtime  Hold fish oil for 2 weeks  Mucinex DM two times a day  Tessalon three times a day as needed cough  Saline nasal rinses. as needed   Prednisone taper over next week.  Please contact office for sooner follow up if symptoms do not improve or worsen  follow up Dr. Delford Field 2-3 months  Her updated medication list for this problem includes:    Pulmicort Flexhaler 180 Mcg/act Inha (Budesonide (inhalation)) .Marland Kitchen... 2 puffs  twice daily    Proair Hfa 108 (90 Base) Mcg/act Aers (Albuterol sulfate) .Marland Kitchen... 1-2 puffs every 4-6 hours as needed    Mucinex Dm Maximum Strength 60-1200 Mg Xr12h-tab (Dextromethorphan-guaifenesin) .Marland Kitchen..Marland Kitchen Two times a day    Tessalon 200  Mg Caps (Benzonatate) .Marland Kitchen... 1 by mouth three times a day as needed cough  Orders: T-2 View CXR (71020TC) Est. Patient Level IV (52841)  Medications Added to Medication List This Visit: 1)  Klonopin 0.5 Mg Tabs (Clonazepam) .... 1/2 tab at bedtime only 2)  Glucosamine-chondroitin 1500-1200 Mg/14ml Liqd (Glucosamine-chondroitin) .... One tab once daily 3)  Mucinex Dm Maximum Strength 60-1200 Mg Xr12h-tab (Dextromethorphan-guaifenesin) .... Two times a day 4)  Fish Oil Concentrate 1000 Mg Caps (Omega-3 fatty acids) .... Once daily  ***on hold*** 5)  Alj - Natural Food Pill  .Marland Kitchen.. 4 daily as needed   *** on hold  *** 6)  Naproxen  .... Two times a day 7)  Prednisone 10 Mg Tabs (Prednisone) .... 4 tabs for 3 days, then 3 tabs for 3 days, 2 tabs for 3 days, then 1 tab for 3 days, then stop  Complete Medication List: 1)  Metrogel 1 % Gel (Metronidazole) .... As directed 2)  Pulmicort Flexhaler 180 Mcg/act Inha (Budesonide (inhalation)) .... 2 puffs  twice daily 3)  Wellbutrin Xl 150 Mg Tb24 (Bupropion hcl) .... Take 1 tablet by mouth once a day 4)  Klonopin 0.5 Mg Tabs (Clonazepam) .... 1/2 tab at bedtime only 5)  Paxil Cr 12.5 Mg Tb24 (Paroxetine hcl) .... Take 1 tablet by mouth once a day 6)  Flonase 50 Mcg/act Susp (Fluticasone propionate) .... Two puff each nostril daily 7)  Glucosamine-chondroitin 1500-1200 Mg/109ml Liqd (Glucosamine-chondroitin) .... One tab once daily 8)  Zegerid 40-1100 Mg Caps (Omeprazole-sodium bicarbonate) .... Once daily 9)  Folic Acid 1 Mg Tabs (Folic acid) .Marland Kitchen.. 1 tab by mouth two times a day 10)  Proair Hfa 108 (90 Base) Mcg/act Aers (Albuterol sulfate) .Marland Kitchen.. 1-2 puffs every 4-6 hours as needed 11)  Mucinex Dm Maximum Strength 60-1200 Mg Xr12h-tab (Dextromethorphan-guaifenesin) .... Two times a day 12)  Vitamins D 5000 Unit Caps  .Marland KitchenMarland Kitchen. 1 by mouth daily 13)  Fish Oil Concentrate 1000 Mg Caps (Omega-3 fatty acids) .... Once daily  ***on hold*** 14)  Synthroid 88 Mcg  Tabs (Levothyroxine sodium) .... Take only on sunday 15)  Synthroid 75 Mcg Tabs (Levothyroxine sodium) .Marland Kitchen.. 1 by mouth daily except for sunday 16)  Vemma Mv Drink  .... Daily 17)  Calcium Carbonate-vitamin D 600-400 Mg-unit Tabs (Calcium carbonate-vitamin d) .... Three times a day  18)  Ginkgo Biloba Extr (Ginkgo biloba) .... 2 two times a day 19)  Alj - Natural Food Pill  .Marland Kitchen.. 4 daily as needed   *** on hold  *** 20)  Vitamin C 500 Mg Tabs (Ascorbic acid) .... Take 1 tablet by mouth two times a day' 21)  Biotin 5000 5 Mg Caps (Biotin) .... Take 1 tablet by mouth once a day 22)  Menostar 14 Mcg/24hr Ptwk (Estradiol) .... One patch weekly 23)  Tessalon 200 Mg Caps (Benzonatate) .Marland Kitchen.. 1 by mouth three times a day as needed cough 24)  Naproxen  .... Two times a day 25)  Prednisone 10 Mg Tabs (Prednisone) .... 4 tabs for 3 days, then 3 tabs for 3 days, 2 tabs for 3 days, then 1 tab for 3 days, then stop  Patient Instructions: 1)  Add Zyrtec 10mg  at bedtime  2)  Hold fish oil for 2 weeks 3)   Mucinex DM two times a day  4)  Tessalon three times a day as needed cough  5)  Saline nasal rinses. as needed  6)   Prednisone taper over next week.  7)  Please contact office for sooner follow up if symptoms do not improve or worsen  8)  follow up Dr. Delford Field 2-3 months  Prescriptions: PREDNISONE 10 MG TABS (PREDNISONE) 4 tabs for 3 days, then 3 tabs for 3 days, 2 tabs for 3 days, then 1 tab for 3 days, then stop  #30 x 0   Entered and Authorized by:   Rubye Oaks NP   Signed by:   Tammy Parrett NP on 02/14/2009   Method used:   [facility] to        [facility]       [address]       [place], [place]  [address]       Ph: [phone] or [phone]       Fax: [phone]   RxID:   [id number]   Appended Document: cough and congestion//jrc noted and agree with this plan of care  pw

## 2010-03-14 NOTE — Letter (Signed)
Summary: [facility]   Imported By: Lanelle Bal 05/10/2009 09:03:58  _____________________________________________________________________  External Attachment:    Type:   Image     Comment:   External Document

## 2010-03-14 NOTE — Letter (Signed)
Summary: [facility]   Imported By: Lanelle Bal 01/17/2010 13:29:54  _____________________________________________________________________  External Attachment:    Type:   Image     Comment:   External Document

## 2010-03-14 NOTE — Progress Notes (Signed)
Summary: cough and congestion  Phone Note Call from Patient Call back at [phone]   Caller: Patient Call For: Anai Lipson Summary of Call: still have cough and congestion in left ear should she continue taking mucinex dm and tussalon pearls? Initial call taken by: Rickard Patience,  February 14, 2009 9:48 AM  Follow-up for Phone Call        Pt c/o intermittent productive cough, with clear mucus worse at night. Pt c/o left ear crackles, and denies pain. Pt states she has been taking Mucinex DM two times a day, Tessalon 200mg  two times a day, Flonase two sprays each nostril at bedtime, Pulmicort two sprays  every morning and two each night if needed. Pt will be leaving out of town today aroung 2pm. please advise. Zackery Barefoot CMA  February 14, 2009 10:44 AM   Additional Follow-up for Phone Call Additional follow up Details #1::        Needs ov with Tammy Parrett for examination  or could see PCP Additional Follow-up by: Storm Frisk MD,  February 14, 2009 11:01 AM    Additional Follow-up for Phone Call Additional follow up Details #2::    ok per Shanda Bumps to use 2:45 slot. Pt aware. Carron Curie CMA  February 14, 2009 11:29 AM

## 2010-03-14 NOTE — Assessment & Plan Note (Signed)
Summary: allergies   Vital Signs:  Patient profile:   65 year old female Height:      63 inches Weight:      161 pounds BMI:     28.62 O2 Sat:      98 % on Room air Temp:     98.5 degrees F oral Pulse rate:   77 / minute BP sitting:   138 / 88  (left arm) Cuff size:   regular  Vitals Entered By: Payton Spark CMA (November 25, 2009 2:52 PM)  O2 Flow:  Room air CC: L ear pain, chest congestion, and SOB x 1 week.    Primary Care Provider:  Seymour Bars DO  CC:  L ear pain, chest congestion, and and SOB x 1 week. Marland Kitchen  History of Present Illness: 65 yo WF presents for L sided facial pain that started 4 days ago.  Her daughter had a cold and she was around her last week.  She c/o feeling tired.  She has chest congestion and runny nose.  She also c/o chronic daytime fatigue.  She has seasonal alleriges and thinks that her zyrtec and flonase are not working anymore.  She sees Dr Delford Field for asthma, using Pulmicort only once a day and needs new RX for Albuterol HFA.      Allergies (verified): 1)  ! Morphine 2)  ! * Avelox  Past History:  Past Medical History: Reviewed history from 11/30/2008 and no changes required. Current Problems:  HYPERLIPIDEMIA (ICD-272.4) ARTHRITIS, HANDS, BILATERAL (ICD-716.94) ASTHMATIC BRONCHITIS, ACUTE (ICD-466.0) PEPTIC ULCER DISEASE (ICD-533.90) ALLERGIC RHINITIS (ICD-477.9) ASTHMA (ICD-493.90) VERTIGO, BENIGN PAROXYSMAL POSITION (ICD-386.11) DEPRESSION (ICD-311) GERD (ICD-530.81) HYPOTHYROIDISM (ICD-244.9) LOC OSTEOARTHROS NOT SPEC PRIM/SEC LOWER LEG (ICD-715.36) VOCAL CORD DISORDER (ICD-478.5) Nephrolithiasis  Dr Primitivo Gauze -- gyn  Past Surgical History: Reviewed history from 06/23/2008 and no changes required. torn meniscus in knees  ( athroscopy) sinus surgery colonoscopy rotator cuff R shoulder 07/10/07  Panendoscopy. Benign tumor removed from thumb  Social History: Reviewed history from 06/22/2008 and no changes  required. Married.  Has 3 kids.  Oldest daughter in [place].  Son in [place] with 3 kids.  youngest daughter lives with her.  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 Works out once a wk  Review of Systems      See HPI  Physical Exam  General:  alert, well-developed, well-nourished, and well-hydrated.   Head:  normocephalic and atraumatic.  L maxilary sinus TTP Eyes:  conjunctiva clear Ears:  EACs patent; TMs translucent and gray with good cone of light and bony landmarks.  Nose:  minimal clear rhinorrhea without boggy turbinates Mouth:  o/p w/o injection Neck:  no masses.   Lungs:  Normal respiratory effort, chest expands symmetrically. Lungs are clear to auscultation, no crackles or wheezes. Heart:  Normal rate and regular rhythm. S1 and S2 normal without gallop, murmur, click, rub or other extra sounds. Skin:  color normal.   Cervical Nodes:  No lymphadenopathy noted   Impression & Recommendations:  Problem # 1:  ALLERGIC RHINITIS (ICD-477.9) Allergy flare up with possible some viral URI exposure.   No sign of secondary bacterial infeciton.  Supportive care -- change Zyrtec to Allegra due to sedation and sample of Omnaris given to use in place of Flonase for now.  Continue nasal saline two times a day and f/u with Dr Delford Field for asthma which does not appear to be flaring.  Call if sinus pain not improving after  67 days. Her updated medication list for this problem includes:    Flonase 50 Mcg/act Susp (Fluticasone propionate) .Marland Kitchen..Marland Kitchen Two puff each nostril daily  Problem # 2:  SOMNOLENCE (ICD-780.09) Despite sleeping well at night, she c/o being sleepy all day.   Will get her scheduled for a sleep study. Orders: Sleep Study (Sleep Study)  Complete Medication List: 1)  Metrogel 1 % Gel (Metronidazole) .... As directed 2)  Pulmicort Flexhaler 180 Mcg/act Inha (Budesonide (inhalation)) .... 2 puffs  twice daily 3)  Wellbutrin Xl 150 Mg  Tb24 (Bupropion hcl) .... Take 1 tablet by mouth once a day 4)  Klonopin 0.5 Mg Tabs (Clonazepam) .... 1/2 tab at bedtime only 5)  Paxil Cr 12.5 Mg Tb24 (Paroxetine hcl) .... Take 1 tablet by mouth once a day 6)  Flonase 50 Mcg/act Susp (Fluticasone propionate) .... Two puff each nostril daily 7)  Glucosamine-chondroitin 1500-1200 Mg/48ml Liqd (Glucosamine-chondroitin) .... One tab once daily 8)  Zegerid 40-1100 Mg Caps (Omeprazole-sodium bicarbonate) .... Once daily 9)  Folic Acid 1 Mg Tabs (Folic acid) .Marland Kitchen.. 1 tab by mouth two times a day 10)  Proair Hfa 108 (90 Base) Mcg/act Aers (Albuterol sulfate) .Marland Kitchen.. 1-2 puffs every 4-6 hours as needed 11)  Mucinex Dm Maximum Strength 60-1200 Mg Xr12h-tab (Dextromethorphan-guaifenesin) .... Two times a day 12)  Vitamins D 5000 Unit Caps  .Marland KitchenMarland Kitchen. 1 by mouth daily 13)  Synthroid 88 Mcg Tabs (Levothyroxine sodium) .... Take only on "sunday 14)  Synthroid 75 Mcg Tabs (Levothyroxine sodium) .... 1 by mouth daily except for sunday 15)  Vemma Mv Drink  .... Daily 16)  Calcium Carbonate-vitamin D 600-400 Mg-unit Tabs (Calcium carbonate-vitamin d) .... Three times a day 17)  Ginkgo Biloba Extr (Ginkgo biloba) .... 2 two times a day 18)  Alj - Natural Food Pill  .... 4 daily as needed   *** on hold  *** 19)  Vitamin C 500 Mg Tabs (Ascorbic acid) .... Take 1 tablet by mouth two times a day' 20)  Biotin 5000 5 Mg Caps (Biotin) .... Take 1 tablet by mouth once a day 21)  Menostar 14 Mcg/24hr Ptwk (Estradiol) .... One patch weekly 22)  Naproxen  .... Two times a day  Patient Instructions: 1)  Take OTC Allega 180 mg once daily for allergies. 2)  Substitude Flonase with Omnaris until complete. 3)  Call if sinus pain has not improved in 7 days. 4)  Will set up sleep study at [facility]. Prescriptions: PROAIR HFA 108 (90 BASE) MCG/ACT  AERS (ALBUTEROL SULFATE) 1-2 puffs every 4-6 hours as needed  #1 x 0   Entered and Authorized by:   Karen Bowen DO   Signed by:   Karen Bowen  DO on 11/25/2009   Method used:   [id number]       [address]       [place], [place]  [address]       Ph: [phone] or [phone]       Fax: [phone]   RxID:   [id number]

## 2010-03-14 NOTE — Assessment & Plan Note (Signed)
Summary: rib pain   Vital Signs:  Patient profile:   65 year old female Height:      63 inches Weight:      165.75 pounds BMI:     29.47 O2 Sat:      95 % on Room air Temp:     97.7 degrees F oral Pulse rate:   81 / minute Pulse rhythm:   regular Resp:     18 per minute BP sitting:   133 / 82  (right arm) Cuff size:   regular  Vitals Entered By: Mervin Kung CMA Duncan Dull) (December 26, 2009 1:29 PM)  O2 Flow:  Room air CC: Pt states she injured her right ribs 2 weeks ago while leaning over the washing machine. Is Patient Diabetic? No Comments Pt states she has stopped the ALJ natural food pill. Uses Omnaris 2 puffs each nostril at bedtime alternating with Flonase. Nicki Guadalajara Fergerson CMA Duncan Dull)  December 26, 2009 1:31 PM    Primary Care Provider:  Seymour Bars DO  CC:  Pt states she injured her right ribs 2 weeks ago while leaning over the washing machine.Marland Kitchen  History of Present Illness: 65 yo WF presents for R rib pain that occured about 2 wks ago while she was bending over the washing machine to clean out the bottom of her washing machine.  She has had pain ever since.  The bone of her bra hurts it.  She is taking Tylenol.  She felt a pop.  She has pain with taking a deep breathe.  She has no change in cough or fevers.  She does not have osteoporosis.    Allergies: 1)  ! Morphine 2)  ! * Avelox  Past History:  Past Medical History: Reviewed history from 11/30/2008 and no changes required. Current Problems:  HYPERLIPIDEMIA (ICD-272.4) ARTHRITIS, HANDS, BILATERAL (ICD-716.94) ASTHMATIC BRONCHITIS, ACUTE (ICD-466.0) PEPTIC ULCER DISEASE (ICD-533.90) ALLERGIC RHINITIS (ICD-477.9) ASTHMA (ICD-493.90) VERTIGO, BENIGN PAROXYSMAL POSITION (ICD-386.11) DEPRESSION (ICD-311) GERD (ICD-530.81) HYPOTHYROIDISM (ICD-244.9) LOC OSTEOARTHROS NOT SPEC PRIM/SEC LOWER LEG (ICD-715.36) VOCAL CORD DISORDER (ICD-478.5) Nephrolithiasis  Dr Primitivo Gauze -- gyn  Past Surgical  History: Reviewed history from 06/23/2008 and no changes required. torn meniscus in knees  ( athroscopy) sinus surgery colonoscopy rotator cuff R shoulder 07/10/07  Panendoscopy. Benign tumor removed from thumb  Social History: Reviewed history from 06/22/2008 and no changes required. Married.  Has 3 kids.  Oldest daughter in [place].  Son in [place] with 3 kids.  youngest daughter lives with her.  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 Works out once a wk  Review of Systems      See HPI  Physical Exam  General:  alert, well-developed, well-nourished, and well-hydrated.   Head:  normocephalic and atraumatic.   Chest Wall:  R anterior midline, tender at Rib 9 rib angle Lungs:  splinting with deep inspiration, no accessory muscle use, normal breath sounds, and no wheezes.   Heart:  normal rate, regular rhythm, and no murmur.   Msk:  R anterior rib pain with reaching w/ R hand and pushing. Skin:  no bruising or redness   Impression & Recommendations:  Problem # 1:  RIB PAIN, RIGHT SIDED (ICD-786.50)  2 wks after initial injury.  Will xray to see if she has broken it.   Conservative care with heating pad, deep breathing exercises, pain meds. If fx, will take 8 wks or so to heel.  Orders: T-DG Ribs Unilateral*R* (71100)  Complete  Medication List: 1)  Metrogel 1 % Gel (Metronidazole) .... As directed 2)  Pulmicort Flexhaler 180 Mcg/act Inha (Budesonide (inhalation)) .... 2 puffs  twice daily 3)  Wellbutrin Xl 150 Mg Tb24 (Bupropion hcl) .... Take 1 tablet by mouth once a day 4)  Klonopin 0.5 Mg Tabs (Clonazepam) .... 1/2 tab at bedtime only 5)  Paxil Cr 12.5 Mg Tb24 (Paroxetine hcl) .... Take 1 tablet by mouth once a day 6)  Flonase 50 Mcg/act Susp (Fluticasone propionate) .... Two puff each nostril daily 7)  Glucosamine-chondroitin 1500-1200 Mg/32ml Liqd (Glucosamine-chondroitin) .... One tab once daily 8)  Zegerid 40-1100 Mg Caps  (Omeprazole-sodium bicarbonate) .... Once daily 9)  Folic Acid 1 Mg Tabs (Folic acid) .Marland Kitchen.. 1 tab by mouth two times a day 10)  Proair Hfa 108 (90 Base) Mcg/act Aers (Albuterol sulfate) .Marland Kitchen.. 1-2 puffs every 4-6 hours as needed 11)  Mucinex Dm Maximum Strength 60-1200 Mg Xr12h-tab (Dextromethorphan-guaifenesin) .... Two times a day 12)  Vitamins D 5000 Unit Caps  .Marland KitchenMarland Kitchen. 1 by mouth daily 13)  Synthroid 88 Mcg Tabs (Levothyroxine sodium) .... Take only on "sunday 14)  Synthroid 75 Mcg Tabs (Levothyroxine sodium) .... 1 by mouth daily except for sunday 15)  Vemma Mv Drink  .... Daily 16)  Calcium Carbonate-vitamin D 600-400 Mg-unit Tabs (Calcium carbonate-vitamin d) .... Three times a day 17)  Ginkgo Biloba Extr (Ginkgo biloba) .... 2 two times a day 18)  Alj - Natural Food Pill  .... 4 daily as needed   *** on hold  *** 19)  Vitamin C 500 Mg Tabs (Ascorbic acid) .... Take 1 tablet by mouth two times a day' 20)  Biotin 5000 5 Mg Caps (Biotin) .... Take 1 tablet by mouth once a day 21)  Menostar 14 Mcg/24hr Ptwk (Estradiol) .... One patch weekly 22)  Naproxen  .... Two times a day 23)  Acetaminophen-codeine #3 300-30 Mg Tabs (Acetaminophen-codeine) .... 1-2 tabs by mouth three times a day with food as needed for severe pain  Patient Instructions: 1)  Xray Ribs downstairs. 2)  Will call you w/ results tomorrow. 3)  Use heating pad for comfort. 4)  Can use Meloxicam for mild to moderate pain or Tylenol #3 for severe pain.  Caution: sedation and constipating side effects. Prescriptions: ACETAMINOPHEN-CODEINE #3 300-30 MG TABS (ACETAMINOPHEN-CODEINE) 1-2 tabs by mouth three times a day with food as needed for severe pain  #30 x 0   Entered and Authorized by:   Karen Bowen DO   Signed by:   Karen Bowen DO on 12/26/2009   Method used:   Printed then faxed [facility] ...       [facility] [id number]       [address]       [place], [place]  [address]       Ph: [phone] or [phone]       Fax:  [phone]   RxID:   [id number]    Orders Added: 1)  T-DG Ribs Unilateral*R* [71100] 2)  Est. Patient Level III [84166]    Current Allergies (reviewed today): ! MORPHINE ! * AVELOX

## 2010-03-14 NOTE — Progress Notes (Signed)
Summary: Call A Nurse  Phone Note From Other Clinic   Summary of Call: [facility] Triage Call Report Triage Record Num: [id number] Operator: Yvette Rack Patient Name: [name] Call Date & Time: 04/13/2009 5:40:00PM Patient Phone: [phone] PCP: Seymour Bars, DO Patient Gender: Female PCP Fax : [phone] Patient DOB: September 09, 1945 Practice Name: [facility] Reason for Call: Pt c/o left ear pain x 2 days. Afebrile. Pt reports the pain is severe at times and Tylenol not helping to releive the discomfort. RN informed pt she needs to be seen within the next 3-4 hours and may go to [facility] for evaluation. Additional care advice given. Protocol(s) Used: Ear - Pain/Injury/Foreign Body Recommended Outcome per Protocol: See Provider within 4 hours Reason for Outcome: Severe pain (sharp, stabbing, throbbing or excruciating aching) unresponsive to 24 hours of home care Care Advice: Consider acetaminophen as directed on label or by pharmacist/provider for pain or fever PRECAUTIONS: - If there is no history of liver disease, alcoholism, or intake of three or more alcohol drinks per day - If approved by provider during pregnancy or when breastfeeding - During pregnancy, acetaminophen should not be taken more than 3 consecutive days without telling provider - Do not exceed recommended dose or frequency  Initial call taken by: Payton Spark CMA,  April 14, 2009 10:14 AM  Follow-up for Phone Call        Pls call pt back to check in with her.  See if she was seen in UC for her ear.  Needs OV if still having pain. Follow-up by: Seymour Bars DO,  April 14, 2009 10:17 AM     Appended Document: Call A Nurse Pt is on ABX and will call if no better

## 2010-03-14 NOTE — Progress Notes (Signed)
Summary: nos appt  Phone Note Call from Patient   Caller: [email or web address]  Call For: Joniyah Mallinger Summary of Call: LMTCB x2 to rsc nos from 10/21. Initial call taken by: Darletta Moll,  December 05, 2009 3:21 PM

## 2010-03-14 NOTE — Assessment & Plan Note (Signed)
Summary: L ear pain x today rm 2   Vital Signs:  Patient Profile:   65 Years Old Female CC:      Cold & URI symptoms Height:     63 inches (162.56 cm) Weight:      174 pounds O2 Sat:      100 % O2 treatment:    Room Air Temp:     97.2 degrees F oral Pulse rate:   69 / minute Pulse rhythm:   regular Resp:     16 per minute BP sitting:   154 / 92  (right arm) Cuff size:   regular  Vitals Entered By: Areta Haber CMA (April 13, 2009 7:25 PM)                  Prior Medication List:  METROGEL 1 % GEL (METRONIDAZOLE) as directed PULMICORT FLEXHALER 180 MCG/ACT INHA (BUDESONIDE (INHALATION)) 2 puffs  twice daily WELLBUTRIN XL 150 MG  TB24 (BUPROPION HCL) Take 1 tablet by mouth once a day KLONOPIN 0.5 MG  TABS (CLONAZEPAM) 1/2 tab at bedtime only PAXIL CR 12.5 MG  TB24 (PAROXETINE HCL) Take 1 tablet by mouth once a day FLONASE 50 MCG/ACT  SUSP (FLUTICASONE PROPIONATE) two puff each nostril daily GLUCOSAMINE-CHONDROITIN 1500-1200 MG/30ML  LIQD (GLUCOSAMINE-CHONDROITIN) one tab once daily ZEGERID 40-1100 MG  CAPS (OMEPRAZOLE-SODIUM BICARBONATE) once daily FOLIC ACID 1 MG  TABS (FOLIC ACID) 1 TAB by mouth two times a day PROAIR HFA 108 (90 BASE) MCG/ACT  AERS (ALBUTEROL SULFATE) 1-2 puffs every 4-6 hours as needed MUCINEX DM MAXIMUM STRENGTH 60-1200 MG XR12H-TAB (DEXTROMETHORPHAN-GUAIFENESIN) two times a day * VITAMINS  D 5000 UNIT CAPS 1 by mouth daily FISH OIL CONCENTRATE 1000 MG CAPS (OMEGA-3 FATTY ACIDS) once daily  ***ON HOLD*** SYNTHROID 88 MCG TABS (LEVOTHYROXINE SODIUM) take only on Sunday SYNTHROID 75 MCG TABS (LEVOTHYROXINE SODIUM) 1 by mouth daily except for Sunday * VEMMA MV DRINK daily CALCIUM CARBONATE-VITAMIN D 600-400 MG-UNIT  TABS (CALCIUM CARBONATE-VITAMIN D) three times a day GINKGO BILOBA   EXTR (GINKGO BILOBA) 2 two times a day * ALJ - NATURAL FOOD PILL 4 daily as needed   *** ON HOLD  *** VITAMIN C 500 MG  TABS (ASCORBIC ACID) Take 1 tablet by mouth two  times a day' BIOTIN 5000 5 MG CAPS (BIOTIN) Take 1 tablet by mouth once a day MENOSTAR 14 MCG/24HR PTWK (ESTRADIOL) one patch weekly TESSALON 200 MG CAPS (BENZONATATE) 1 by mouth three times a day as needed cough * NAPROXEN two times a day PREDNISONE 10 MG TABS (PREDNISONE) 4 tabs for 3 days, then 3 tabs for 3 days, 2 tabs for 3 days, then 1 tab for 3 days, then stop   Current Allergies (reviewed today): ! MORPHINE ! * AVELOX    History of Present Illness Chief Complaint: Cold & URI symptoms History of Present Illness: Patient w/ L ear pain in December. She was doing better and was treated w antibiotics. The L ear started yesterday and got worse today. She has a hx of hemorrhage in the L ear and has had sinus surgeries and a HX of neyuritis.  Current Problems: ACUTE NASOPHARYNGITIS (ICD-460) EUSTACHIAN TUBE DYSFUNCTION, LEFT (ICD-381.81) ACUTE MAXILLARY SINUSITIS (ICD-461.0) POSTMENOPAUSAL STATUS (ICD-V49.81) OSTEOARTHRITIS, GENERALIZED, MULTIPLE JOINTS (ICD-715.09) PRECORDIAL PAIN (ICD-786.51) HYPERLIPIDEMIA (ICD-272.4) DYSPNEA (ICD-786.05) ARTHRITIS, HANDS, BILATERAL (ICD-716.94) ASTHMATIC BRONCHITIS, ACUTE (ICD-466.0) PEPTIC ULCER DISEASE (ICD-533.90) ALLERGIC RHINITIS (ICD-477.9) ASTHMA (ICD-493.90) VERTIGO, BENIGN PAROXYSMAL POSITION (ICD-386.11) DEPRESSION (ICD-311) GERD (ICD-530.81) HYPOTHYROIDISM (ICD-244.9) LOC OSTEOARTHROS NOT  SPEC PRIM/SEC LOWER LEG (ICD-715.36) VOCAL CORD DISORDER (ICD-478.5)   Current Meds METROGEL 1 % GEL (METRONIDAZOLE) as directed PULMICORT FLEXHALER 180 MCG/ACT INHA (BUDESONIDE (INHALATION)) 2 puffs  twice daily WELLBUTRIN XL 150 MG  TB24 (BUPROPION HCL) Take 1 tablet by mouth once a day KLONOPIN 0.5 MG  TABS (CLONAZEPAM) 1/2 tab at bedtime only PAXIL CR 12.5 MG  TB24 (PAROXETINE HCL) Take 1 tablet by mouth once a day FLONASE 50 MCG/ACT  SUSP (FLUTICASONE PROPIONATE) two puff each nostril daily GLUCOSAMINE-CHONDROITIN 1500-1200 MG/30ML   LIQD (GLUCOSAMINE-CHONDROITIN) one tab once daily ZEGERID 40-1100 MG  CAPS (OMEPRAZOLE-SODIUM BICARBONATE) once daily FOLIC ACID 1 MG  TABS (FOLIC ACID) 1 TAB by mouth two times a day PROAIR HFA 108 (90 BASE) MCG/ACT  AERS (ALBUTEROL SULFATE) 1-2 puffs every 4-6 hours as needed MUCINEX DM MAXIMUM STRENGTH 60-1200 MG XR12H-TAB (DEXTROMETHORPHAN-GUAIFENESIN) two times a day * VITAMINS  D 5000 UNIT CAPS 1 by mouth daily FISH OIL CONCENTRATE 1000 MG CAPS (OMEGA-3 FATTY ACIDS) once daily  ***ON HOLD*** SYNTHROID 88 MCG TABS (LEVOTHYROXINE SODIUM) take only on Sunday SYNTHROID 75 MCG TABS (LEVOTHYROXINE SODIUM) 1 by mouth daily except for Sunday * VEMMA MV DRINK daily CALCIUM CARBONATE-VITAMIN D 600-400 MG-UNIT  TABS (CALCIUM CARBONATE-VITAMIN D) three times a day GINKGO BILOBA   EXTR (GINKGO BILOBA) 2 two times a day * ALJ - NATURAL FOOD PILL 4 daily as needed   *** ON HOLD  *** VITAMIN C 500 MG  TABS (ASCORBIC ACID) Take 1 tablet by mouth two times a day' BIOTIN 5000 5 MG CAPS (BIOTIN) Take 1 tablet by mouth once a day MENOSTAR 14 MCG/24HR PTWK (ESTRADIOL) one patch weekly TESSALON 200 MG CAPS (BENZONATATE) 1 by mouth three times a day as needed cough * NAPROXEN two times a day PREDNISONE 10 MG TABS (PREDNISONE) 4 tabs for 3 days, then 3 tabs for 3 days, 2 tabs for 3 days, then 1 tab for 3 days, then stop AUGMENTIN 875-125 MG TABS (AMOXICILLIN-POT CLAVULANATE) 1 by mouth 2 times daily AURALGAN  SOLN (ANTIPYRINE-BENZOCAINE-POLYCOS) sig 2-3 drops in affected ear q4-6 hrs as needed for ear pain  REVIEW OF SYSTEMS Constitutional Symptoms      Denies fever, chills, night sweats, weight loss, weight gain, and fatigue.  Eyes       Denies change in vision, eye pain, eye discharge, glasses, contact lenses, and eye surgery. Ear/Nose/Throat/Mouth       Complains of ear pain and sore throat.      Denies hearing loss/aids, change in hearing, ear discharge, dizziness, frequent runny nose, frequent nose  bleeds, sinus problems, hoarseness, and tooth pain or bleeding.      Comments: Left x yesterday Respiratory       Denies dry cough, productive cough, wheezing, shortness of breath, asthma, bronchitis, and emphysema/COPD.  Cardiovascular       Denies murmurs, chest pain, and tires easily with exhertion.    Gastrointestinal       Denies stomach pain, nausea/vomiting, diarrhea, constipation, blood in bowel movements, and indigestion. Genitourniary       Denies painful urination, kidney stones, and loss of urinary control. Neurological       Denies paralysis, seizures, and fainting/blackouts. Musculoskeletal       Denies muscle pain, joint pain, joint stiffness, decreased range of motion, redness, swelling, muscle weakness, and gout.  Skin       Denies bruising, unusual mles/lumps or sores, and hair/skin or nail changes.  Psych  Denies mood changes, temper/anger issues, anxiety/stress, speech problems, depression, and sleep problems.  Past History:  Past Medical History: Last updated: 11/30/2008 Current Problems:  HYPERLIPIDEMIA (ICD-272.4) ARTHRITIS, HANDS, BILATERAL (ICD-716.94) ASTHMATIC BRONCHITIS, ACUTE (ICD-466.0) PEPTIC ULCER DISEASE (ICD-533.90) ALLERGIC RHINITIS (ICD-477.9) ASTHMA (ICD-493.90) VERTIGO, BENIGN PAROXYSMAL POSITION (ICD-386.11) DEPRESSION (ICD-311) GERD (ICD-530.81) HYPOTHYROIDISM (ICD-244.9) LOC OSTEOARTHROS NOT SPEC PRIM/SEC LOWER LEG (ICD-715.36) VOCAL CORD DISORDER (ICD-478.5) Nephrolithiasis  Dr Primitivo Gauze -- gyn  Past Surgical History: Last updated: Jul 05, 2008 torn meniscus in knees  ( athroscopy) sinus surgery colonoscopy rotator cuff R shoulder 07/10/07  Panendoscopy. Benign tumor removed from thumb  Family History: Last updated: 07-05-08 father died of myocardial infarction (first MI at age 90) mother had colon cancer in her 19s, aortic stenosis Family History of Colon CA 1st degree relative <60 brother died of asthma at 24. 1  brother died of liver cancer, ETOH 3 older brothers living  Social History: Last updated: 06/22/2008 Married.  Has 3 kids.  Oldest daughter in [place].  Son in [place] with 3 kids.  youngest daughter lives with her.  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 Works out once a wk  Risk Factors: Smoking Status: never (01/19/2009)  Family History: Reviewed history from Jul 05, 2008 and no changes required. father died of myocardial infarction (first MI at age 18) mother had colon cancer in her 62s, aortic stenosis Family History of Colon CA 1st degree relative <60 brother died of asthma at 12. 1 brother died of liver cancer, ETOH 3 older brothers living  Social History: Reviewed history from 06/22/2008 and no changes required. Married.  Has 3 kids.  Oldest daughter in [place].  Son in [place] with 3 kids.  youngest daughter lives with her.  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 Works out once a wk Physical Exam General appearance: well developed, well nourished, mild to moderate distresss distress Head: normocephalic, atraumatic Ears: fluid noted without inflammation bilateral TM tenderness over the L eustacian tube Nasal: swollen red turbinates with congestion Oral/Pharynx: pharyngeal erythema without exudate, uvula midline without deviation Neck: supple,anterior lymphadenopathy present Skin: no obvious rashes or lesions MSE: oriented to time, place, and person Assessment New Problems: ACUTE NASOPHARYNGITIS (ICD-460) EUSTACHIAN TUBE DYSFUNCTION, LEFT (ICD-381.81)  eustacian tube dysfunction  Plan New Medications/Changes: AURALGAN  SOLN (ANTIPYRINE-BENZOCAINE-POLYCOS) sig 2-3 drops in affected ear q4-6 hrs as needed for ear pain  #1 bottle x 0, 04/13/2009, Hassan Rowan MD AUGMENTIN 875-125 MG TABS (AMOXICILLIN-POT CLAVULANATE) 1 by mouth 2 times daily  #20 x 0, 04/13/2009, Hassan Rowan  MD  New Orders: New Patient Level III [16109] Rapid Strep [60454] Planning Comments:   as below  Follow Up: Follow up in 2-3 days if no improvement, Follow up on an as needed basis, Follow up with Primary Physician  The patient and/or caregiver has been counseled thoroughly with regard to medications prescribed including dosage, schedule, interactions, rationale for use, and possible side effects and they verbalize understanding.  Diagnoses and expected course of recovery discussed and will return if not improved as expected or if the condition worsens. Patient and/or caregiver verbalized understanding.  Prescriptions: AURALGAN  SOLN (ANTIPYRINE-BENZOCAINE-POLYCOS) sig 2-3 drops in affected ear q4-6 hrs as needed for ear pain  #1 bottle x 0   Entered and Authorized by:   Hassan Rowan MD   Signed by:   Hassan Rowan MD on 04/13/2009   Method used:   Printed then faxed to .Marland KitchenMarland Kitchen  [phone] (retail)       [address]       [place], [place]  [address]       Ph: [phone] or [phone]       Fax: [phone]   RxID:   [id number] AUGMENTIN 875-125 MG TABS (AMOXICILLIN-POT CLAVULANATE) 1 by mouth 2 times daily  #20 x 0   Entered and Authorized by:   Hassan Rowan MD   Signed by:   Hassan Rowan MD on 04/13/2009   Method used:   Printed then faxed to ...       [id number]ail[facility]       [address]       [place], [place]  [address]       Ph: [phone] or [phone]       Fax: [phone]   RxID:   [id number]   Patient Instructions: 1)  Please schedule a follow-up appointment as needed. 2)  Please schedule an appointment with your primary doctor in :7-14 days sooner if not better 3)  Take your antibiotic as prescribed until ALL of it is gone, but stop if you develop a rash or swelling and contact our office as soon as possible.  Laboratory Results  Date/Time Received: April 13, 2009 8:33 PM  Date/Time Reported: April 13, 2009 8:33 PM   Other  Tests  Rapid Strep: negative  Kit Test Internal QC: Negative   (Normal Range: Negative)

## 2010-03-16 NOTE — Progress Notes (Signed)
Summary: prescription  Phone Note Call from Patient   Caller: Patient Call For: dr Delford Field Summary of Call: Pt phoned stated that the last time that she saw Dr. Delford Field he was going to give her a prescription for Pro Air but she left without it. [phone]. Patient can be reached [phone] or cell [phone] Initial call taken by: Vedia Coffer,  February 17, 2010 9:52 AM  Follow-up for Phone Call        refill sent. Carron Curie CMA  February 17, 2010 10:51 AM     Prescriptions: PROAIR HFA 108 (90 BASE) MCG/ACT  AERS (ALBUTEROL SULFATE) 1-2 puffs every 4-6 hours as needed  #1 x 6   Entered by:   Carron Curie CMA   Authorized by:   Storm Frisk MD   Signed by:   Carron Curie CMA on 02/17/2010   Method used:   Electronically to        [phone][facility] (retail)       [address]       [place], [place]  [address]       Ph: [phone] or [phone]       Fax: [phone]   RxID:   [id number]

## 2010-03-16 NOTE — Assessment & Plan Note (Signed)
Summary: Pulmonary OV   Primary Provider/Referring Provider:  Seymour Bars DO  CC:  Acute Visit.  throbbing/pulsing feeling in ears, runny nose, body aches, nonprod cough, and increased SOB with activity x couple of wks.  .  History of Present Illness:  This is a 65 year old, white female with chronic allergic rhinitis and extrinsic asthma.  The patient also has associated vocal cord dysfunction syndrome and reflux disease.   December 21, 2008 3:06 PM since last ov  difficulty in the early fall,  ok in the summer Recently had sinus drainage and cough.  Had thick white nasal mucous that is now better   January 19, 2009 11:42 AM Became ill on the trip [place],  was in [place] first and dust exposure and dry heat.  Would wake up sneezing.  then more cough and ears stopped up.  Now cannot hear.  Severe pain on the flight.  Very cold and snow and rain.  Now mucous is productive and chunks.  This is deep yellow.  Started pred and avelox.  (5days course)  8 day course,  no fever but has chills  January 31, 2009 --Presents for 2 week follow up: SOB is unchanged, dry hacking cough, increased fatigue with sleeping in excess of 8-10hrs per day.  finished abx and pred taper. Last visit tx w/ avelox and prednisone taper. Sinus congestion is better but still stuffy.  Denies chest pain,  orthopnea, hemoptysis, fever, n/v/d, edema, headache, no discolored mucus.   February 14, 2009 --Returns for persistent symptoms. Complains of dry cough, low grade fever, chest congestion, chest tightness, fatigue, and crackling in left ear when swollowing/blowng nose.  states she has been having these sxs since begining of Dec, sxs started to improve but started to come back on Saturday. Symptoms did improve quite a bit, but then over last 2 days, cough, congestion started back. Medications are not working. Denies chest pain, dyspnea, orthopnea, hemoptysis, fever, n/v/d, edema, headache. Cough is mainly dry with no discolored  mucus. January 24, 2010 4:44 PM Since last ov , lost weight then gained back.  Now not eating carbohydrates last two weeks , more sinus drainage and cough. More dyspnea.  Had some issues with the ear in the past  R ear and L ear now is throbbing. no fever.    Thet pt has no mucus when coughs, the cough is dry.  No f/c/s.  Pt notes frontal headaches.    Current Medications (verified): 1)  Metrogel 1 % Gel (Metronidazole) .... As Directed 2)  Pulmicort Flexhaler 180 Mcg/act Inha (Budesonide (Inhalation)) .... 2 Puffs  Twice Daily 3)  Wellbutrin Xl 150 Mg  Tb24 (Bupropion Hcl) .... Take 1 Tablet By Mouth Once A Day 4)  Klonopin 0.5 Mg  Tabs (Clonazepam) .... 1/2 Tab At Bedtime Only 5)  Paxil Cr 12.5 Mg  Tb24 (Paroxetine Hcl) .... Take 1 Tablet By Mouth Two Times A Day 6)  Flonase 50 Mcg/act  Susp (Fluticasone Propionate) .... Two Puff Each Nostril Daily 7)  Glucosamine-Chondroitin 1500-1200 Mg/40ml  Liqd (Glucosamine-Chondroitin) .... One Tab Once Daily 8)  Zegerid 40-1100 Mg  Caps (Omeprazole-Sodium Bicarbonate) .... Once Daily 9)  Folic Acid 1 Mg  Tabs (Folic Acid) .Marland Kitchen.. 1 Tab By Mouth Two Times A Day 10)  Proair Hfa 108 (90 Base) Mcg/act  Aers (Albuterol Sulfate) .Marland Kitchen.. 1-2 Puffs Every 4-6 Hours As Needed 11)  Mucinex Maximum Strength 1200 Mg Xr12h-Tab (Guaifenesin) .... Take 1 Tablet By Mouth Two Times  A Day 12)  Vitamin D3 5000 Unit Caps (Cholecalciferol) .... Take 1 Capsule By Mouth Once A Day 13)  Synthroid 88 Mcg Tabs (Levothyroxine Sodium) .... Take Only On "Sunday 14)  Synthroid 75 Mcg Tabs (Levothyroxine Sodium) .... 1 By Mouth Daily Except For Sunday 15)  Vemma Mv Drink .... Daily 16)  Calcium Carbonate-Vitamin D 600-400 Mg-Unit  Tabs (Calcium Carbonate-Vitamin D) .... Three Times A Day 17)  Ginkgo Biloba   Extr (Ginkgo Biloba) .... Take 1 Tablet By Mouth Three Times A Day 18)  Vitamin C 500 Mg  Tabs (Ascorbic Acid) .... Take 1 Tablet By Mouth Two Times A Day' 19)  Biotin 5000 5 Mg  Caps (Biotin) .... Take 1 Tablet By Mouth Once A Day 20)  Menostar 14 Mcg/24hr Ptwk (Estradiol) .... One Patch Weekly 21)  Acetaminophen-Codeine #3 300-30 Mg Tabs (Acetaminophen-Codeine) .... 1-2 Tabs By Mouth Three Times A Day With Food As Needed For Severe Pain  Allergies (verified): 1)  ! Morphine 2)  ! * Avelox  Past History:  Past medical, surgical, family and social histories (including risk factors) reviewed, and no changes noted (except as noted below).  Past Medical History: Reviewed history from 11/30/2008 and no changes required. Current Problems:  HYPERLIPIDEMIA (ICD-272.4) ARTHRITIS, HANDS, BILATERAL (ICD-716.94) ASTHMATIC BRONCHITIS, ACUTE (ICD-466.0) PEPTIC ULCER DISEASE (ICD-533.90) ALLERGIC RHINITIS (ICD-477.9) ASTHMA (ICD-493.90) VERTIGO, BENIGN PAROXYSMAL POSITION (ICD-386.11) DEPRESSION (ICD-311) GERD (ICD-530.81) HYPOTHYROIDISM (ICD-244.9) LOC OSTEOARTHROS NOT SPEC PRIM/SEC LOWER LEG (ICD-715.36) VOCAL CORD DISORDER (ICD-478.5) Nephrolithiasis  Dr Fletcher -- gyn  Past Surgical History: Reviewed history from 06/23/2008 and no changes required. torn meniscus in knees  ( athroscopy) sinus surgery colonoscopy rotator cuff R shoulder 07/10/07  Panendoscopy. Benign tumor removed from thumb  Past Pulmonary History:  Pulmonary History: Moderate persistent asthma   -FeV1 90% Fef 25-75 60% 2008 allergic rhinitis  Family History: Reviewed history from 06/23/2008 and no changes required. father died of myocardial infarction (first MI at age 50) mother had colon cancer in her 80s, aortic stenosis Family History of Colon CA 1st degree relative <60 brother died of asthma at 60. 1 brother died of liver cancer, ETOH 3 older brothers living  Social History: Reviewed history from 06/22/2008 and no changes required. Married.  Has 3 kids.  Oldest daughter in [place].  Son in [place] with 3 kids.  youngest daughter lives with her.  Retired teacher and  nurses aid. Quit smoking in the 1960s. 2 glasses wine/ wk. Works out once a wk  Review of Systems       The patient complains of shortness of breath with activity, non-productive cough, and nasal congestion/difficulty breathing through nose.  The patient denies shortness of breath at rest, productive cough, coughing up blood, chest pain, irregular heartbeats, acid heartburn, indigestion, loss of appetite, weight change, abdominal pain, difficulty swallowing, sore throat, tooth/dental problems, headaches, sneezing, itching, ear ache, anxiety, depression, hand/feet swelling, joint stiffness or pain, rash, change in color of mucus, and fever.    Vital Signs:  Patient profile:   64 year old female Height:      63 inches Weight:      168 pounds BMI:     29.87 O2 Sat:      95 % on Room air Temp:     98" .0 degrees F oral Pulse rate:   81 / minute BP sitting:   118 / 78  (left arm) Cuff size:   regular  Vitals Entered By: Gweneth Dimitri RN (January 24, 2010 4:40 PM)  O2 Flow:  Room air CC: Acute Visit.  throbbing/pulsing feeling in ears, runny nose, body aches, nonprod cough, increased SOB with activity x couple of wks.   Comments Medications reviewed with patient Daytime contact number verified with patient. Gweneth Dimitri RN  January 24, 2010 4:40 PM    Physical Exam  Additional Exam:  Gen: Pleasant, well nourished, in no distress ENT: erythematous turbinates, bilateral purulence  Neck: No JVD, no TMG, no carotid bruits Lungs: coarse BS w /no wheeizng, harsh sounding cough  Cardiovascular: RRR, heart sounds normal, no murmurs or gallops, no peripheral edema Musculoskeletal: No deformities, no cyanosis or clubbing     Impression & Recommendations:  Problem # 1:  ACUTE MAXILLARY SINUSITIS (ICD-461.0) Assessment Deteriorated  bilateral maxillary sinusitis with flare plan Prednisone 10mg  Take 4 daily for two days, then 3 daily for two days, then two daily for two days then one  daily for two days then stop Augmentin (generic) one twice daily Use mucinex 1200mg  two times a day x 10days Nasal rinse twice daily with nasal saline rinse Stay on pulmicort  Her updated medication list for this problem includes:    Flonase 50 Mcg/act Susp (Fluticasone propionate) .Marland Kitchen..Marland Kitchen Two puff each nostril daily    Mucinex Maximum Strength 1200 Mg Xr12h-tab (Guaifenesin) .Marland Kitchen... Take 1 tablet by mouth two times a day    Amoxicillin-pot Clavulanate 875-125 Mg Tabs (Amoxicillin-pot clavulanate) ..... One by mouth two times a day    Simply Saline 0.9 % Aers (Saline) .Marland Kitchen... As directed  Orders: Est. Patient Level IV ([address])  Medications Added to Medication List This Visit: 1)  Paxil Cr 12.5 Mg Tb24 (Paroxetine hcl) .... Take 1 tablet by mouth two times a day 2)  Mucinex Maximum Strength 1200 Mg Xr12h-tab (Guaifenesin) .... Take 1 tablet by mouth two times a day 3)  Vitamin D3 5000 Unit Caps (Cholecalciferol) .... Take 1 capsule by mouth once a day 4)  Ginkgo Biloba Extr (Ginkgo biloba) .... Take 1 tablet by mouth three times a day 5)  Amoxicillin-pot Clavulanate 875-125 Mg Tabs (Amoxicillin-pot clavulanate) .... One by mouth two times a day 6)  Prednisone 10 Mg Tabs (Prednisone) .... Take as directed take 4 daily for two days, then 3 daily for two days, then two daily for two days then one daily for two days then stop 7)  Simply Saline 0.9 % Aers (Saline) .... As directed  Complete Medication List: 1)  Metrogel 1 % Gel (Metronidazole) .... As directed 2)  Pulmicort Flexhaler 180 Mcg/act Inha (Budesonide (inhalation)) .... 2 puffs  twice daily 3)  Wellbutrin Xl 150 Mg Tb24 (Bupropion hcl) .... Take 1 tablet by mouth once a day 4)  Klonopin 0.5 Mg Tabs (Clonazepam) .... 1/2 tab at bedtime only 5)  Paxil Cr 12.5 Mg Tb24 (Paroxetine hcl) .... Take 1 tablet by mouth two times a day 6)  Flonase 50 Mcg/act Susp (Fluticasone propionate) .... Two puff each nostril daily 7)  Glucosamine-chondroitin  1500-1200 Mg/53ml Liqd (Glucosamine-chondroitin) .... One tab once daily 8)  Zegerid 40-1100 Mg Caps (Omeprazole-sodium bicarbonate) .... Once daily 9)  Folic Acid 1 Mg Tabs (Folic acid) .Marland Kitchen.. 1 tab by mouth two times a day 10)  Proair Hfa 108 (90 Base) Mcg/act Aers (Albuterol sulfate) .Marland Kitchen.. 1-2 puffs every 4-6 hours as needed 11)  Mucinex Maximum Strength 1200 Mg Xr12h-tab (Guaifenesin) .... Take 1 tablet by mouth two times a day 12)  Vitamin D3 5000 Unit Caps (Cholecalciferol) .... Take 1 capsule by mouth  once a day 13)  Synthroid 88 Mcg Tabs (Levothyroxine sodium) .... Take only on "sunday 14)  Synthroid 75 Mcg Tabs (Levothyroxine sodium) .... 1 by mouth daily except for sunday 15)  Vemma Mv Drink  .... Daily 16)  Calcium Carbonate-vitamin D 600-400 Mg-unit Tabs (Calcium carbonate-vitamin d) .... Three times a day 17)  Ginkgo Biloba Extr (Ginkgo biloba) .... Take 1 tablet by mouth three times a day 18)  Vitamin C 500 Mg Tabs (Ascorbic acid) .... Take 1 tablet by mouth two times a day' 19)  Biotin 5000 5 Mg Caps (Biotin) .... Take 1 tablet by mouth once a day 20)  Menostar 14 Mcg/24hr Ptwk (Estradiol) .... One patch weekly 21)  Acetaminophen-codeine #3 300-30 Mg Tabs (Acetaminophen-codeine) .... 1-2 tabs by mouth three times a day with food as needed for severe pain 22)  Amoxicillin-pot Clavulanate 875-125 Mg Tabs (Amoxicillin-pot clavulanate) .... One by mouth two times a day 23)  Prednisone 10 Mg Tabs (Prednisone) .... Take as directed take 4 daily for two days, then 3 daily for two days, then two daily for two days then one daily for two days then stop 24)  Simply Saline 0.9 % Aers (Saline) .... As directed  Patient Instructions: 1)  Prednisone 10mg Take 4 daily for two days, then 3 daily for two days, then two daily for two days then one daily for two days then stop 2)  Augmentin (generic) one twice daily 3)  Use mucinex 1200mg two times a day x 10days 4)  Nasal rinse twice daily with nasal  saline rinse 5)  Stay on pulmicort  6)  Return 6 months, sooner if not improved  Prescriptions: PREDNISONE 10 MG  TABS (PREDNISONE) Take as directed Take 4 daily for two days, then 3 daily for two days, then two daily for two days then one daily for two days then stop  #20 x 0   Entered and Authorized by:   Patrick E Wright MD   Signed by:   Patrick E Wright MD on 01/24/2010   Method used:   [facility] to        [id number]       [address]       [place], [place]  [address]       Ph: [phone] or [phone]       Fax: [phone]   RxID:   [id number] AMOXICILLIN-POT CLAVULANATE 875-125 MG TABS (AMOXICILLIN-POT CLAVULANATE) one by mouth two times a day  #14 x 0   Entered and Authorized by:   Patrick E Wright MD   Signed by:   Patrick E Wright MD on 01/24/2010   Method used:   [facility] to        [id number]       [address]       [place], [place]  [address]       Ph: [phone] or [phone]       Fax: [phone]   RxID:   [id number]

## 2010-03-17 ENCOUNTER — Encounter: Payer: Self-pay | Admitting: Family Medicine

## 2010-03-17 ENCOUNTER — Ambulatory Visit (INDEPENDENT_AMBULATORY_CARE_PROVIDER_SITE_OTHER): Payer: Medicare Other | Admitting: Family Medicine

## 2010-03-17 DIAGNOSIS — J309 Allergic rhinitis, unspecified: Secondary | ICD-10-CM

## 2010-03-17 DIAGNOSIS — J029 Acute pharyngitis, unspecified: Secondary | ICD-10-CM

## 2010-03-20 ENCOUNTER — Telehealth (INDEPENDENT_AMBULATORY_CARE_PROVIDER_SITE_OTHER): Payer: Self-pay | Admitting: *Deleted

## 2010-03-22 ENCOUNTER — Telehealth: Payer: Self-pay | Admitting: Critical Care Medicine

## 2010-03-22 ENCOUNTER — Encounter: Payer: Self-pay | Admitting: Critical Care Medicine

## 2010-03-22 ENCOUNTER — Ambulatory Visit (INDEPENDENT_AMBULATORY_CARE_PROVIDER_SITE_OTHER): Payer: Medicare Other | Admitting: Critical Care Medicine

## 2010-03-22 DIAGNOSIS — J0100 Acute maxillary sinusitis, unspecified: Secondary | ICD-10-CM

## 2010-03-22 NOTE — Assessment & Plan Note (Signed)
Summary: SINUSITIS   Vital Signs:  Patient profile:   65 year old female Height:      63 inches Weight:      173 pounds BMI:     30.76 O2 Sat:      96 % on Room air Temp:     99.1 degrees F oral Pulse rate:   83 / minute BP sitting:   140 / 81  (left arm) Cuff size:   large  Vitals Entered By: Payton Spark CMA (March 17, 2010 1:42 PM)  O2 Flow:  Room air CC: Congestion, ST, drainage and cough x 2 days.   Primary Care Provider:  Seymour Bars DO  CC:  Congestion, ST, and drainage and cough x 2 days.Marland Kitchen  History of Present Illness: 65 yo WF presents for a severe sore throat that started today.  She had a mild sore throat since Dec.  She is on Allegra and Flonase for allergies but has not been using her PUlmicort due to her sore throat.  Took Tylenol last night and had a low grade fever last night and this morning.  Denies rhinorrhea.  Using Simply Saline.  Denies much postnasal drip.  She denies facial pain or pressure or HA.  She had bodyaches last night.  Good appetite and no GI upset.    Allergies: 1)  ! Morphine 2)  ! * Avelox  Past History:  Past Medical History: Reviewed history from 11/30/2008 and no changes required. Current Problems:  HYPERLIPIDEMIA (ICD-272.4) ARTHRITIS, HANDS, BILATERAL (ICD-716.94) ASTHMATIC BRONCHITIS, ACUTE (ICD-466.0) PEPTIC ULCER DISEASE (ICD-533.90) ALLERGIC RHINITIS (ICD-477.9) ASTHMA (ICD-493.90) VERTIGO, BENIGN PAROXYSMAL POSITION (ICD-386.11) DEPRESSION (ICD-311) GERD (ICD-530.81) HYPOTHYROIDISM (ICD-244.9) LOC OSTEOARTHROS NOT SPEC PRIM/SEC LOWER LEG (ICD-715.36) VOCAL CORD DISORDER (ICD-478.5) Nephrolithiasis  Dr Primitivo Gauze -- gyn  Past Surgical History: Reviewed history from 06/23/2008 and no changes required. torn meniscus in knees  ( athroscopy) sinus surgery colonoscopy rotator cuff R shoulder 07/10/07  Panendoscopy. Benign tumor removed from thumb  Social History: Reviewed history from 06/22/2008 and no changes  required. Married.  Has 3 kids.  Oldest daughter in [place].  Son in [place] with 3 kids.  youngest daughter lives with her.  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 Works out once a wk  Review of Systems      See HPI  Physical Exam  General:  alert, well-developed, well-nourished, and well-hydrated.   Head:  normocephalic and atraumatic.  sinuses NTTP Eyes:  conjunctiva w/o injection, glassy bilat Ears:  EACs patent; TMs translucent and gray with good cone of light and bony landmarks.  Nose:  scant clear rhinorrhea Mouth:  o/p injected but no exudates or vesicles Neck:  no masses.   Lungs:  Normal respiratory effort, chest expands symmetrically. Lungs are clear to auscultation, no crackles or wheezes. Heart:  Normal rate and regular rhythm. S1 and S2 normal without gallop, murmur, click, rub or other extra sounds. Skin:  color normal.   Cervical Nodes:  tender, enlarged bilat submandibular nodes   Impression & Recommendations:  Problem # 1:  SORE THROAT (ICD-462) Rapid strep neg, ST likely due to drainage from ABS.  Will treat this with Amox x 10 days in addition to supportive care. Orders: Rapid Strep ([address])  Her updated medication list for this problem includes:    Amoxicillin 500 Mg Caps (Amoxicillin) .Marland Kitchen... 1 capsule by mouth three times a day x 10 days  Problem # 2:  ALLERGIC RHINITIS (ICD-477.9) Continue  Allegra, but change to Allegra D for the next 2 wks and continue Flonase.   Her updated medication list for this problem includes:    Flonase 50 Mcg/act Susp (Fluticasone propionate) .Marland Kitchen..Marland Kitchen Two puff each nostril daily    Simply Saline 0.9 % Aers (Saline) .Marland Kitchen... As directed  Complete Medication List: 1)  Metrogel 1 % Gel (Metronidazole) .... As directed 2)  Pulmicort Flexhaler 180 Mcg/act Inha (Budesonide (inhalation)) .... 2 puffs  twice daily 3)  Wellbutrin Xl 150 Mg Tb24 (Bupropion hcl) .... Take 1 tablet by mouth once a  day 4)  Klonopin 0.5 Mg Tabs (Clonazepam) .... 1/2 tab at bedtime only 5)  Paxil Cr 12.5 Mg Tb24 (Paroxetine hcl) .... Take 1 tablet by mouth two times a day 6)  Flonase 50 Mcg/act Susp (Fluticasone propionate) .... Two puff each nostril daily 7)  Glucosamine-chondroitin 1500-1200 Mg/96ml Liqd (Glucosamine-chondroitin) .... One tab once daily 8)  Zegerid 40-1100 Mg Caps (Omeprazole-sodium bicarbonate) .... Once daily 9)  Folic Acid 1 Mg Tabs (Folic acid) .Marland Kitchen.. 1 tab by mouth two times a day 10)  Proair Hfa 108 (90 Base) Mcg/act Aers (Albuterol sulfate) .Marland Kitchen.. 1-2 puffs every 4-6 hours as needed 11)  Mucinex Maximum Strength 1200 Mg Xr12h-tab (Guaifenesin) .... Take 1 tablet by mouth two times a day 12)  Vitamin D3 5000 Unit Caps (Cholecalciferol) .... Take 1 capsule by mouth once a day 13)  Synthroid 88 Mcg Tabs (Levothyroxine sodium) .... Take only on "sunday 14)  Synthroid 75 Mcg Tabs (Levothyroxine sodium) .... 1 by mouth daily except for sunday 15)  Vemma Mv Drink  .... Daily 16)  Calcium Carbonate-vitamin D 600-400 Mg-unit Tabs (Calcium carbonate-vitamin d) .... Three times a day 17)  Ginkgo Biloba Extr (Ginkgo biloba) .... Take 1 tablet by mouth three times a day 18)  Vitamin C 500 Mg Tabs (Ascorbic acid) .... Take 1 tablet by mouth two times a day' 19)  Biotin 5000 5 Mg Caps (Biotin) .... Take 1 tablet by mouth once a day 20)  Menostar 14 Mcg/24hr Ptwk (Estradiol) .... One patch weekly 21)  Acetaminophen-codeine #3 300-30 Mg Tabs (Acetaminophen-codeine) .... 1-2 tabs by mouth three times a day with food as needed for severe pain 22)  Simply Saline 0.9 % Aers (Saline) .... As directed 23)  Amoxicillin 500 Mg Caps (Amoxicillin) .... 1 capsule by mouth three times a day x 10 days 24)  Prednisone 20 Mg Tabs (Prednisone) .... 2 tabs by mouth qam x 5 days  Patient Instructions: 1)  RAPID STREP NEG. 2)  WILL TREAT FOR SINUSITIS WITH AMOXICILLIN 3 X A DAY X 10 DAYS. 3)  TAKE PREDNISONE 2 TABS  ONCE DAILY X 5 DAYS. 4)  USE INHALER AS NEEDED FOR WHEEZING. 5)  OK TO TAKE ALLEGRA D FOR UNDERLYING ALLERGIES DURING TREATMENT. 6)  CALL IF NOT IMPROVED IN 10 DAYS. Prescriptions: PREDNISONE 20 MG TABS (PREDNISONE) 2 tabs by mouth qAM x 5 days  #10 x 0   Entered and Authorized by:     DO   Signed by:     DO on 03/17/2010   Method used:   [facility] to        [id number]       [address]       [place], [place]  [address]       Ph: [phone] or [phone]       Fax: [phone]   RxID:   [id number] AMOXICILLIN 500 MG CAPS (  AMOXICILLIN) 1 capsule by mouth three times a day x 10 days  #30 x 0   Entered and Authorized by:   Seymour Bars DO   Signed by:   Seymour Bars DO on 03/17/2010   Method used:   [facility]        [phone] ([facility]ail[facility]       [address]       [place], [place]  [address]       Ph: [phone] or [phone]       Fax: [phone]   RxID:   [id number]    Orders Added: 1)  Rapid Strep [[address]] 2)  Est. Patient Level III [[address]]    Laboratory Results    Other Tests  Rapid Strep: negative

## 2010-03-28 ENCOUNTER — Telehealth (INDEPENDENT_AMBULATORY_CARE_PROVIDER_SITE_OTHER): Payer: Self-pay | Admitting: *Deleted

## 2010-03-30 NOTE — Progress Notes (Signed)
Summary: hoarsness/ chest congestion  Phone Note Call from Patient Call back at Home Phone [phone]   Caller: Patient Call For: [name] Summary of Call: pt c/o hoarseness/ congestion w/ green/ yellow "sometimes mixed with blood (slight streaks) x 6 days. she has seen pcp (last week) and is on amoxycillin and prednisone taper. pt also taking musinex every day. temp is 98.7 (pt says she normally is at 97.5). fevers and chills last week but none today. i offered her an appt w/ tp this afternoon but she says she'll wait until she speaks to nurse. pt also wants to know if she should continue taking allegra at night. [facility] [address] Initial call taken by: Tivis Ringer, CNA,  March 22, 2010 10:48 AM  Follow-up for Phone Call        Pt scheduled to come in to see PW this AM. Zackery Barefoot CMA  March 22, 2010 11:02 AM

## 2010-03-30 NOTE — Progress Notes (Signed)
Summary: Congestion and wheezing  Phone Note Call from Patient   Caller: Patient Summary of Call: Pt states she is having increased mucus and wheezing. I advised mucinex and rescue inhaler as needed. Pt advised to also stay on current treatment plan and call if no better in 10 days as recommended at last OV. Pt agreed. Initial call taken by: Payton Spark CMA,  March 20, 2010 10:07 AM     Appended Document: Congestion and wheezing Pls remind pt that if her asthma symptoms worsen even on current treatment plan, she should f/u with Dr Delford Field.  She is already maximized on treatment here.  Seymour Bars, D.O.  Appended Document: Congestion and wheezing Pt aware of the above

## 2010-04-05 NOTE — Progress Notes (Signed)
Summary: hoarse<<<rec salt water gargles  Phone Note Call from Patient Call back at Home Phone [phone]   Caller: Patient Call For: wright Summary of Call: pt was seen last week. today is the last day of rx cefuroxime. pt still very hoarse. [facility] [address]  Initial call taken by: Tivis Ringer, CNA,  March 28, 2010 9:58 AM  Follow-up for Phone Call        Spoke with pt and she saw PW  on 03-22-10 and was given ceftin x 7 days. Pt states today is her last day of ceftin. She states she does feel some better, her cough has improved. It is still productive but phlegm is now yellow instead of green, sore throat is better but still sore, still has hoarseness, and some SOB as well. Pt states she feels this is still "upper airway." Pt wants to know does PW think she needs anoother round of antibiotics. Please advise.Carron Curie CMA  March 28, 2010 11:22 AM   Additional Follow-up for Phone Call Additional follow up Details #1::        no further antibiotics gargle with salt water observe for now Additional Follow-up by: Storm Frisk MD,  March 28, 2010 1:53 PM    Additional Follow-up for Phone Call Additional follow up Details #2::    The pt is aware of PW recs and will call if her sxs do not improve or get worse. Follow-up by: Michel Bickers CMA,  March 28, 2010 2:07 PM

## 2010-04-05 NOTE — Letter (Signed)
Summary: [facility]   Imported By: Kassie Mends 03/30/2010 08:20:33  _____________________________________________________________________  External Attachment:    Type:   Image     Comment:   External Document

## 2010-04-05 NOTE — Assessment & Plan Note (Signed)
Summary: Pulmonary OV   Primary Provider/Referring Provider:  Seymour Bars DO  CC:  Pt c/o cough w/ green sputum...green/yellow nasal drainage that is blood-streaked...sore throat...hoarseness and occasional wheezing x7 days...currently on Prednisone taper and Amoxicillin.  History of Present Illness:  This is a 65 year old, white female with chronic allergic rhinitis and extrinsic asthma.  The patient also has associated vocal cord dysfunction syndrome and reflux disease.   March 22, 2010 12:16 PM Pt notes she is aching  all over, started one week ago,  saw bowen PCP and rx pred 40mg /d x 5days  . Pt started sat two a day and amoxicil one three times daily but is no better,  no voice, very hoarse, creamy mucus.  took allegra Dwithout much help'     Preventive Screening-Counseling & Management  Alcohol-Tobacco     Smoking Status: never  Current Medications (verified): 1)  Metrogel 1 % Gel (Metronidazole) .... As Directed 2)  Pulmicort Flexhaler 180 Mcg/act Inha (Budesonide (Inhalation)) .... 2 Puffs  Twice Daily 3)  Wellbutrin Xl 150 Mg  Tb24 (Bupropion Hcl) .... Take 1 Tablet By Mouth Once A Day 4)  Klonopin 0.5 Mg  Tabs (Clonazepam) .... 1/4 Tab At Bedtime Only 5)  Paxil Cr 12.5 Mg  Tb24 (Paroxetine Hcl) .... Take 1 Tablet By Mouth Two Times A Day 6)  Flonase 50 Mcg/act  Susp (Fluticasone Propionate) .... Two Puff Each Nostril Daily 7)  Glucosamine-Chondroitin 1500-1200 Mg/26ml  Liqd (Glucosamine-Chondroitin) .... One Tab Once Daily 8)  Zegerid 40-1100 Mg  Caps (Omeprazole-Sodium Bicarbonate) .... Once Daily 9)  Folic Acid 1 Mg  Tabs (Folic Acid) .Marland Kitchen.. 1 Tab By Mouth Two Times A Day 10)  Proair Hfa 108 (90 Base) Mcg/act  Aers (Albuterol Sulfate) .Marland Kitchen.. 1-2 Puffs Every 4-6 Hours As Needed 11)  Mucinex Maximum Strength 1200 Mg Xr12h-Tab (Guaifenesin) .... Take 1 Tablet By Mouth Two Times A Day 12)  Vitamin D3 5000 Unit Caps (Cholecalciferol) .... Take 1 Capsule By Mouth Once A  Day 13)  Synthroid 88 Mcg Tabs (Levothyroxine Sodium) .... Take Only On Sunday 14)  Synthroid 75 Mcg Tabs (Levothyroxine Sodium) .Marland Kitchen.. 1 By Mouth Daily Except For Sunday 15)  Vemma Mv Drink .... Daily 16)  Calcium Carbonate-Vitamin D 600-400 Mg-Unit  Tabs (Calcium Carbonate-Vitamin D) .... Three Times A Day 17)  Ginkgo Biloba   Extr (Ginkgo Biloba) .... Take 1 Tablet By Mouth Three Times A Day 18)  Vitamin C 500 Mg  Tabs (Ascorbic Acid) .... Take 1 Tablet By Mouth Two Times A Day' 19)  Biotin 5000 5 Mg Caps (Biotin) .... Take 1 Tablet By Mouth Once A Day 20)  Menostar 14 Mcg/24hr Ptwk (Estradiol) .... One Patch Weekly 21)  Acetaminophen-Codeine #3 300-30 Mg Tabs (Acetaminophen-Codeine) .Marland Kitchen.. 1-2 Tabs By Mouth Three Times A Day With Food As Needed For Severe Pain 22)  Simply Saline 0.9 % Aers (Saline) .... As Directed 23)  Amoxicillin 500 Mg Caps (Amoxicillin) .Marland Kitchen.. 1 Capsule By Mouth Three Times A Day X 10 Days 24)  Prednisone 20 Mg Tabs (Prednisone) .... 2 Tabs By Mouth Qam X 5 Days 25)  Allegra Allergy 180 Mg Tabs (Fexofenadine Hcl) .Marland Kitchen.. 1 By Mouth Daily  Allergies (verified): 1)  ! Morphine 2)  ! * Avelox  Past History:  Past medical, surgical, family and social histories (including risk factors) reviewed for relevance to current acute and chronic problems.  Past Medical History: Reviewed history from 11/30/2008 and no changes required. Current Problems:  HYPERLIPIDEMIA (  ICD-272.4) ARTHRITIS, HANDS, BILATERAL (ICD-716.94) ASTHMATIC BRONCHITIS, ACUTE (ICD-466.0) PEPTIC ULCER DISEASE (ICD-533.90) ALLERGIC RHINITIS (ICD-477.9) ASTHMA (ICD-493.90) VERTIGO, BENIGN PAROXYSMAL POSITION (ICD-386.11) DEPRESSION (ICD-311) GERD (ICD-530.81) HYPOTHYROIDISM (ICD-244.9) LOC OSTEOARTHROS NOT SPEC PRIM/SEC LOWER LEG (ICD-715.36) VOCAL CORD DISORDER (ICD-478.5) Nephrolithiasis  Dr Primitivo Gauze -- gyn  Past Surgical History: Reviewed history from 06/23/2008 and no changes required. torn  meniscus in knees  ( athroscopy) sinus surgery colonoscopy rotator cuff R shoulder 07/10/07  Panendoscopy. Benign tumor removed from thumb  Past Pulmonary History:  Pulmonary History: Moderate persistent asthma   -FeV1 90% Fef 25-75 60% 2008 allergic rhinitis  Family History: Reviewed history from 06/23/2008 and no changes required. father died of myocardial infarction (first MI at age 47) mother had colon cancer in her 80s, aortic stenosis Family History of Colon CA 1st degree relative <60 brother died of asthma at 3. 1 brother died of liver cancer, ETOH 3 older brothers living  Social History: Reviewed history from 06/22/2008 and no changes required. Married.  Has 3 kids.  Oldest daughter in [place].  Son in [place] with 3 kids.  youngest daughter lives with her.  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 Works out once a wk  Review of Systems       The patient complains of shortness of breath with activity, productive cough, and nasal congestion/difficulty breathing through nose.  The patient denies shortness of breath at rest, non-productive cough, coughing up blood, chest pain, irregular heartbeats, acid heartburn, indigestion, loss of appetite, weight change, abdominal pain, difficulty swallowing, sore throat, tooth/dental problems, headaches, sneezing, itching, ear ache, anxiety, depression, hand/feet swelling, joint stiffness or pain, rash, change in color of mucus, and fever.    Vital Signs:  Patient profile:   65 year old female Height:      63 inches (160.02 cm) Weight:      174.38 pounds (79.26 kg) BMI:     31.00 O2 Sat:      97 % on Room air Temp:     98.8 degrees F (37.11 degrees C) oral Pulse rate:   77 / minute BP sitting:   120 / 70  (left arm) Cuff size:   large  Vitals Entered By: Michel Bickers CMA (March 22, 2010 12:06 PM)  O2 Sat at Rest %:  97 O2 Flow:  Room air CC: Pt c/o cough w/ green  sputum...green/yellow nasal drainage that is blood-streaked...sore throat...hoarseness and occasional wheezing x7 days...currently on Prednisone taper and Amoxicillin Comments Medications reviewed with patient Michel Bickers CMA  March 22, 2010 12:07 PM   Physical Exam  Additional Exam:  Gen: Pleasant, well nourished, in no distress ENT: erythematous turbinates, bilateral purulence  Neck: No JVD, no TMG, no carotid bruits Lungs: coarse BS w /no wheeizng, harsh sounding cough  Cardiovascular: RRR, heart sounds normal, no murmurs or gallops, no peripheral edema Musculoskeletal: No deformities, no cyanosis or clubbing     Impression & Recommendations:  Problem # 1:  ASTHMA (ICD-493.90) Assessment Deteriorated asthmatic bronchitis with flare plan Ceftin (generic) one twice a day for 7days Stop amoxicillin Stop allegra Stop prednisone Use Flonase two sprays each nostril daily Lloyd Huger Med nasal rinse twice a day for 10days  Medications Added to Medication List This Visit: 1)  Klonopin 0.5 Mg Tabs (Clonazepam) .... 1/4 tab at bedtime only 2)  Allegra Allergy 180 Mg Tabs (Fexofenadine hcl) .Marland Kitchen.. 1 by mouth daily 3)  Cefuroxime Axetil 500 Mg Tabs (Cefuroxime axetil) .... One  by mouth two times a day  Complete Medication List: 1)  Metrogel 1 % Gel (Metronidazole) .... As directed 2)  Pulmicort Flexhaler 180 Mcg/act Inha (Budesonide (inhalation)) .... 2 puffs  twice daily 3)  Wellbutrin Xl 150 Mg Tb24 (Bupropion hcl) .... Take 1 tablet by mouth once a day 4)  Klonopin 0.5 Mg Tabs (Clonazepam) .... 1/4 tab at bedtime only 5)  Paxil Cr 12.5 Mg Tb24 (Paroxetine hcl) .... Take 1 tablet by mouth two times a day 6)  Flonase 50 Mcg/act Susp (Fluticasone propionate) .... Two puff each nostril daily 7)  Glucosamine-chondroitin 1500-1200 Mg/87ml Liqd (Glucosamine-chondroitin) .... One tab once daily 8)  Zegerid 40-1100 Mg Caps (Omeprazole-sodium bicarbonate) .... Once daily 9)  Folic Acid 1 Mg Tabs  (Folic acid) .Marland Kitchen.. 1 tab by mouth two times a day 10)  Proair Hfa 108 (90 Base) Mcg/act Aers (Albuterol sulfate) .Marland Kitchen.. 1-2 puffs every 4-6 hours as needed 11)  Mucinex Maximum Strength 1200 Mg Xr12h-tab (Guaifenesin) .... Take 1 tablet by mouth two times a day 12)  Vitamin D3 5000 Unit Caps (Cholecalciferol) .... Take 1 capsule by mouth once a day 13)  Synthroid 88 Mcg Tabs (Levothyroxine sodium) .... Take only on "sunday 14)  Synthroid 75 Mcg Tabs (Levothyroxine sodium) .... 1 by mouth daily except for sunday 15)  Vemma Mv Drink  .... Daily 16)  Calcium Carbonate-vitamin D 600-400 Mg-unit Tabs (Calcium carbonate-vitamin d) .... Three times a day 17)  Ginkgo Biloba Extr (Ginkgo biloba) .... Take 1 tablet by mouth three times a day 18)  Vitamin C 500 Mg Tabs (Ascorbic acid) .... Take 1 tablet by mouth two times a day' 19)  Biotin 5000 5 Mg Caps (Biotin) .... Take 1 tablet by mouth once a day 20)  Menostar 14 Mcg/24hr Ptwk (Estradiol) .... One patch weekly 21)  Acetaminophen-codeine #3 300-30 Mg Tabs (Acetaminophen-codeine) .... 1-2 tabs by mouth three times a day with food as needed for severe pain 22)  Simply Saline 0.9 % Aers (Saline) .... As directed 23)  Cefuroxime Axetil 500 Mg Tabs (Cefuroxime axetil) .... One by mouth two times a day  Other Orders: Est. Patient Level IV (99214)  Patient Instructions: 1)  Ceftin (generic) one twice a day for 7days 2)  Stop amoxicillin 3)  Stop allegra 4)  Stop prednisone 5)  Use Flonase two sprays each nostril daily 6)  Neil Med nasal rinse twice a day for 10days 7)  Use the NeilMed nasal rinse at least daily washing out both nares thoroughly.  Place one packet of Sinus Wash ingredients into the nasal wash bottle then fill to the dotted line with lukewarm tap water.  Lean over the sink and rinse each nostril out thoroughly and avoid letting the rinse go into the throat.   8)  Return one month Prescriptions: CEFUROXIME AXETIL 500 MG TABS (CEFUROXIME  AXETIL) one by mouth two times a day  #14 x 0   Entered and Authorized by:    E  MD   Signed by:    E  MD on 03/22/2010   Method used:   Electronically to        [id number] (retail)       [address]       [place], [place]  [address]       Ph: [phone] or [phone]       Fax: [phone]   RxID:   [id number]

## 2010-05-03 ENCOUNTER — Telehealth: Payer: Self-pay | Admitting: Critical Care Medicine

## 2010-05-03 ENCOUNTER — Encounter: Payer: Self-pay | Admitting: Critical Care Medicine

## 2010-05-03 NOTE — Telephone Encounter (Signed)
Called and spoke with pt and she c/o runny nose w/ clear mucus w/ occas yellow phlem, sore throat, sweats, low grade temp, constantly sneezing all x since yesterday. Pt denies any cough. Pt has been taking tylenol for the low grade temp, been using her flonase (but no relief) and started taking afrin X1 day and no relief as well. Pt has an apt on Monday with Dr. Delford Field but pt was calling to get his recs on what she should do/take until then. Please advise Dr. Delford Field Thanks Allergies  Allergen Reactions  . Morphine     REACTION: Nausea  . Moxifloxacin     REACTION: confusion, dizziness, paranoia   Carver Fila, [place]

## 2010-05-03 NOTE — Telephone Encounter (Signed)
Call in :  Augmentin 875mg  bid x 7days  Prednisone 10mg  Take 4 for two days, three for two days, two for two days, then one for two days and stop Ov if unimproved

## 2010-05-03 NOTE — Telephone Encounter (Signed)
ATC pt back, NA and no option to leave a msg. WCB.

## 2010-05-04 ENCOUNTER — Ambulatory Visit (HOSPITAL_BASED_OUTPATIENT_CLINIC_OR_DEPARTMENT_OTHER): Payer: Medicare Other

## 2010-05-04 MED ORDER — AMOXICILLIN-POT CLAVULANATE 875-125 MG PO TABS: 1.0000 | ORAL_TABLET | Freq: Two times a day (BID) | ORAL | Status: AC

## 2010-05-04 MED ORDER — PREDNISONE 10 MG PO TABS: ORAL_TABLET | ORAL | Status: DC

## 2010-05-04 NOTE — Telephone Encounter (Signed)
Called and informed pt rx's was sent. Pt verbalized understanidnf [name], [place]

## 2010-05-05 ENCOUNTER — Encounter (HOSPITAL_BASED_OUTPATIENT_CLINIC_OR_DEPARTMENT_OTHER): Payer: Medicare Other

## 2010-05-05 ENCOUNTER — Telehealth: Payer: Self-pay | Admitting: Critical Care Medicine

## 2010-05-05 NOTE — Telephone Encounter (Signed)
Called and advised pt per Crystal okay for pt to take allegra and the mucinex together. Pt verbalized understanding  Carver Fila, CMA

## 2010-05-08 ENCOUNTER — Encounter: Payer: Self-pay | Admitting: Critical Care Medicine

## 2010-05-08 ENCOUNTER — Ambulatory Visit (INDEPENDENT_AMBULATORY_CARE_PROVIDER_SITE_OTHER): Payer: Medicare Other | Admitting: Critical Care Medicine

## 2010-05-08 DIAGNOSIS — J0100 Acute maxillary sinusitis, unspecified: Secondary | ICD-10-CM

## 2010-05-08 DIAGNOSIS — J309 Allergic rhinitis, unspecified: Secondary | ICD-10-CM

## 2010-05-08 DIAGNOSIS — R404 Transient alteration of awareness: Secondary | ICD-10-CM

## 2010-05-08 DIAGNOSIS — J45909 Unspecified asthma, uncomplicated: Secondary | ICD-10-CM

## 2010-05-08 NOTE — Patient Instructions (Signed)
Finish prednisone and augmentin for now No change in other medications Return in 3 months

## 2010-05-08 NOTE — Assessment & Plan Note (Signed)
Recent sinusitis with asthma flare. Plan Maintain pulmicort  Prn SABA  Finish pred/abx for sinusitis

## 2010-05-08 NOTE — Assessment & Plan Note (Signed)
Hypersomnolence, snoring progressive for one year Plan Sleep study is scheduled per Dr Cathey Endow

## 2010-05-08 NOTE — Progress Notes (Signed)
Subjective:    Patient ID: [name], female    DOB: 1945-07-31, 66 y.o.   MRN: [id number]  Cough This is a new problem. The current episode started in the past 7 days. The problem has been rapidly improving. The problem occurs every few minutes. The cough is productive of sputum. Associated symptoms include chills, ear congestion, ear pain, a fever, headaches, nasal congestion, postnasal drip, rhinorrhea and sweats. Pertinent negatives include no heartburn, hemoptysis, myalgias, sore throat, shortness of breath, weight loss or wheezing. Her past medical history is significant for asthma and environmental allergies.    F/U of   65 y.o.    white female with chronic allergic rhinitis and extrinsic asthma. The patient also has associated vocal cord dysfunction syndrome and reflux disease.   05/08/2010 At last ov 2/12 we rec: 1) Ceftin (generic) one twice a day for 7days  2) Stop amoxicillin  3) Stop allegra  4) Stop prednisone  5) Use Flonase two sprays each nostril daily  6) Neil Med nasal rinse twice a day for 10days  Pt did well until 5 days ago when she developed more    Symptoms of severe sneezing, low grade fever,  Pt  Then had  runny nose,  Pt noted creamy to yellow mucus out of the nares  but no true cough, pt noted  sl hoarseness  All sinus type issues and then ache all over then temp to 101.2.  I called in augmentin and pred and pt is better now one week ago. Has a sleep study test pending scheduled soon  Symptoms have improved  Past Medical History  Diagnosis Date  . Other and unspecified hyperlipidemia   . Unspecified arthropathy, hand   . Acute bronchitis   . Peptic ulcer, unspecified site, unspecified as acute or chronic, without mention of hemorrhage, perforation, or obstruction   . Allergic rhinitis, cause unspecified   . Unspecified asthma   . Benign paroxysmal positional vertigo   . Depressive disorder, not elsewhere classified   . Esophageal reflux   .  Unspecified hypothyroidism   . Localized osteoarthrosis not specified whether primary or secondary, lower leg   . Other diseases of vocal cords      Family History  Problem Relation Age of Onset  . Heart disease Father   . Colon cancer Mother   . Liver cancer Brother   . Asthma Brother      History   Social History  . Marital Status: Married    Spouse Name: N/A    Number of Children: 3  . Years of Education: N/A   Occupational History  . Retired    Social History Main Topics  . Smoking status: Former Smoker -- 0.3 packs/day for 2 years    Types: Cigarettes    Quit date: 02/12/1958  . Smokeless tobacco: Never Used  . Alcohol Use: Yes     wine socially  . Drug Use: No  . Sexually Active: Not on file   Other Topics Concern  . Not on file   Social History Narrative  . No narrative on file     Allergies  Allergen Reactions  . Morphine     REACTION: Nausea  . Moxifloxacin     REACTION: confusion, dizziness, paranoia     Outpatient Prescriptions Prior to Visit  Medication Sig Dispense Refill  . albuterol (PROVENTIL HFA) 108 (90 BASE) MCG/ACT inhaler Inhale 2 puffs into the lungs every 6 (six) hours as needed.        Marland Kitchen  amoxicillin-clavulanate (AUGMENTIN) 875-125 MG per tablet Take 1 tablet by mouth 2 (two) times daily.  14 tablet  0  . Ascorbic Acid (VITAMIN C) 500 MG tablet Take 500 mg by mouth 2 (two) times daily.        . Biotin (BIOTIN 5000) 5 MG CAPS Take 1 capsule by mouth daily.        . budesonide (PULMICORT FLEXHALER) 180 MCG/ACT inhaler Inhale 2 puffs into the lungs daily.       Marland Kitchen buPROPion (WELLBUTRIN XL) 150 MG 24 hr tablet Take 150 mg by mouth daily.        . Calcium Carbonate-Vit D-Min 600-400 MG-UNIT TABS 1 tablet by mouth 2-3 times daily      . clonazePAM (KLONOPIN) 0.5 MG tablet 1/4 tablet at  bedtime       . estradiol (MENOSTAR) 14 MCG/24HR Place 1 patch onto the skin once a week. Apply the patch to a clean, dry, non-oily skin area of your lower  abdomen, hips below the waist, or buttocks that has little or no hair and is free of cuts or irritation.      . fluticasone (FLONASE) 50 MCG/ACT nasal spray 2 sprays by Nasal route daily.        . folic acid (FOLVITE) 1 MG tablet Take 1 mg by mouth 2 (two) times daily.        . Ginkgo Biloba 40 MG TABS Take 1 tablet by mouth 3 (three) times daily.        . Glucosamine HCl 1500 MG TABS Take 1 tablet by mouth daily.        . Guaifenesin (MUCINEX MAXIMUM STRENGTH) 1200 MG TB12 Take 1 tablet by mouth 2 (two) times daily.        Marland Kitchen levothyroxine (SYNTHROID, LEVOTHROID) 75 MCG tablet Take 75 mcg by mouth daily. Except Sunday        . levothyroxine (SYNTHROID, LEVOTHROID) 88 MCG tablet Take only sunday       . metroNIDAZOLE (METROGEL) 1 % gel Apply topically daily.        Marland Kitchen omeprazole-sodium bicarbonate (ZEGERID) 40-1100 MG per capsule Take 1 capsule by mouth daily before breakfast.        . PARoxetine (PAXIL-CR) 12.5 MG 24 hr tablet Take 12.5 mg by mouth 2 (two) times daily.        . predniSONE (DELTASONE) 10 MG tablet Take 4 tablets a day x 2 days, 3 tablets a day x 2 days, 2 tablets a day x 2 days, 1 tablet a day x 2 days and then stop  21 tablet  0  . sodium chloride (BRONCHO SALINE) inhaler solution Saline wash twice daily      . Acetaminophen-Codeine 300-30 MG per tablet 1-2 tabs by mouth three times a day with food as needed for severe pain       . Cholecalciferol (VITAMIN D-3) 5000 UNITS TABS Take 1 tablet by mouth daily.          Review of Systems  Constitutional: Positive for fever and chills. Negative for weight loss.  HENT: Positive for ear pain, rhinorrhea and postnasal drip. Negative for sore throat.   Respiratory: Positive for cough. Negative for hemoptysis, shortness of breath and wheezing.   Gastrointestinal: Negative for heartburn.  Musculoskeletal: Negative for myalgias.  Neurological: Positive for headaches.  Hematological: Positive for environmental allergies.         Objective:   Physical Exam Gen: Pleasant, well-nourished, in no distress,  normal affect  ENT: No lesions,  mouth clear,  oropharynx clear, mild  postnasal drip  Neck: No JVD, no TMG, no carotid bruits  Lungs: No use of accessory muscles, no dullness to percussion, no wheeze except mild pseudowheeze.  Cardiovascular: RRR, heart sounds normal, no murmur or gallops, no peripheral edema  Abdomen: soft and NT, no HSM,  BS normal  Musculoskeletal: No deformities, no cyanosis or clubbing  Neuro: alert, non focal  Skin: Warm, no lesions or rashes    No images are attached to the encounter.        Assessment & Plan:   SOMNOLENCE Hypersomnolence, snoring progressive for one year Plan Sleep study is scheduled per Dr Cathey Endow  Acute maxillary sinusitis Acute sinusitis better with ABX/pred, this was recurrent and Dx by telephone 3/20.  Pt is better with empiric augmentin/pulse prednisone  Plan Finish pred pulse and augmentin as Rx Cont nasal steroids and sinus rinse  ALLERGIC RHINITIS Improved Plan Cont allegra and flonase   Asthma with allergic rhinitis Recent sinusitis with asthma flare. Plan Maintain pulmicort  Prn SABA  Finish pred/abx for sinusitis    Updated Medication List Outpatient Encounter Prescriptions as of 05/08/2010  Medication Sig Dispense Refill  . albuterol (PROVENTIL HFA) 108 (90 BASE) MCG/ACT inhaler Inhale 2 puffs into the lungs every 6 (six) hours as needed.        Marland Kitchen amoxicillin-clavulanate (AUGMENTIN) 875-125 MG per tablet Take 1 tablet by mouth 2 (two) times daily.  14 tablet  0  . Ascorbic Acid (VITAMIN C) 500 MG tablet Take 500 mg by mouth 2 (two) times daily.        . Biotin (BIOTIN 5000) 5 MG CAPS Take 1 capsule by mouth daily.        . budesonide (PULMICORT FLEXHALER) 180 MCG/ACT inhaler Inhale 2 puffs into the lungs daily.       Marland Kitchen buPROPion (WELLBUTRIN XL) 150 MG 24 hr tablet Take 150 mg by mouth daily.        . Calcium Carbonate-Vit  D-Min 600-400 MG-UNIT TABS 1 tablet by mouth 2-3 times daily      . Cholecalciferol (VITAMIN D3) 3000 UNITS TABS 1 tablet by mouth once daily       . clonazePAM (KLONOPIN) 0.5 MG tablet 1/4 tablet at  bedtime       . estradiol (MENOSTAR) 14 MCG/24HR Place 1 patch onto the skin once a week. Apply the patch to a clean, dry, non-oily skin area of your lower abdomen, hips below the waist, or buttocks that has little or no hair and is free of cuts or irritation.      . fexofenadine (ALLEGRA) 180 MG tablet Take 180 mg by mouth at bedtime.        . fluticasone (FLONASE) 50 MCG/ACT nasal spray 2 sprays by Nasal route daily.        . folic acid (FOLVITE) 1 MG tablet Take 1 mg by mouth 2 (two) times daily.        . Ginkgo Biloba 40 MG TABS Take 1 tablet by mouth 3 (three) times daily.        . Glucosamine HCl 1500 MG TABS Take 1 tablet by mouth daily.        . Guaifenesin (MUCINEX MAXIMUM STRENGTH) 1200 MG TB12 Take 1 tablet by mouth 2 (two) times daily.        Marland Kitchen levothyroxine (SYNTHROID, LEVOTHROID) 75 MCG tablet Take 75 mcg by mouth daily. Except Sunday        .  levothyroxine (SYNTHROID, LEVOTHROID) 88 MCG tablet Take only sunday       . LYSINE PO Once daily       . metroNIDAZOLE (METROGEL) 1 % gel Apply topically daily.        Marland Kitchen omeprazole-sodium bicarbonate (ZEGERID) 40-1100 MG per capsule Take 1 capsule by mouth daily before breakfast.        . PARoxetine (PAXIL-CR) 12.5 MG 24 hr tablet Take 12.5 mg by mouth 2 (two) times daily.        . predniSONE (DELTASONE) 10 MG tablet Take 4 tablets a day x 2 days, 3 tablets a day x 2 days, 2 tablets a day x 2 days, 1 tablet a day x 2 days and then stop  21 tablet  0  . sodium chloride (BRONCHO SALINE) inhaler solution Saline wash twice daily      . DISCONTD: Acetaminophen-Codeine 300-30 MG per tablet 1-2 tabs by mouth three times a day with food as needed for severe pain       . DISCONTD: Cholecalciferol (VITAMIN D-3) 5000 UNITS TABS Take 1 tablet by mouth  daily.

## 2010-05-08 NOTE — Assessment & Plan Note (Addendum)
Acute sinusitis better with ABX/pred, this was recurrent and Dx by telephone 3/20.  Pt is better with empiric augmentin/pulse prednisone  Plan Finish pred pulse and augmentin as Rx Cont nasal steroids and sinus rinse

## 2010-05-08 NOTE — Assessment & Plan Note (Signed)
Improved Plan Cont allegra and flonase

## 2010-05-09 ENCOUNTER — Telehealth: Payer: Self-pay | Admitting: Family Medicine

## 2010-05-09 MED ORDER — PROMETHAZINE HCL 25 MG RE SUPP: 25.0000 mg | Freq: Four times a day (QID) | RECTAL | Status: DC | PRN

## 2010-05-09 MED ORDER — ACETAMINOPHEN 325 MG RE SUPP: 325.0000 mg | RECTAL | Status: DC | PRN

## 2010-05-09 NOTE — Telephone Encounter (Signed)
Pt called and states she has a stomach virus and has had nausea and vomiting and body aches and wants a tylenol suoppository called in as well as a nausea suppository called in. Husband went to pharm and they only make child suppositories per pharmacist

## 2010-05-09 NOTE — Telephone Encounter (Signed)
Meds sent. Pt to call if not better in 24-48 hours.

## 2010-05-11 ENCOUNTER — Telehealth: Payer: Self-pay | Admitting: Internal Medicine

## 2010-05-11 ENCOUNTER — Telehealth: Payer: Self-pay | Admitting: *Deleted

## 2010-05-11 NOTE — Telephone Encounter (Signed)
Called and spoke with pt and she stated that PW started pt on pred and amoxicillin on 3-22 and she took these until 3-26 due to stomach virus--vomiting started on Tuesday 3-27 and her stomach still does not feel great.  She is asking if she should finish the 3 days left of the abx and pred when her stomach feels better.  She stated that her symptoms have cleared up.  Please advise. thanks

## 2010-05-11 NOTE — Telephone Encounter (Signed)
Patient phoned stated that she was waiting on a call from the nurse she can be reached at [phone].Vedia Coffer

## 2010-05-11 NOTE — Telephone Encounter (Signed)
I would NOT resume the antibiotic or prednisone at this time

## 2010-05-11 NOTE — Telephone Encounter (Signed)
Called and spoke with pt.  Informed her of PW's response and recs.  Pt verbalized understanding and denied any questions and will not resume abx or pred taper.

## 2010-05-11 NOTE — Telephone Encounter (Signed)
Pt notified and had already picked up rx.

## 2010-06-06 ENCOUNTER — Ambulatory Visit (HOSPITAL_BASED_OUTPATIENT_CLINIC_OR_DEPARTMENT_OTHER): Payer: Medicare Other | Attending: Family Medicine

## 2010-06-06 DIAGNOSIS — G4710 Hypersomnia, unspecified: Secondary | ICD-10-CM | POA: Insufficient documentation

## 2010-06-06 DIAGNOSIS — G4730 Sleep apnea, unspecified: Secondary | ICD-10-CM | POA: Insufficient documentation

## 2010-06-16 DIAGNOSIS — G4710 Hypersomnia, unspecified: Secondary | ICD-10-CM

## 2010-06-16 DIAGNOSIS — G4730 Sleep apnea, unspecified: Secondary | ICD-10-CM

## 2010-06-16 NOTE — Procedures (Signed)
NAMEJAMYRA, ZWEIG            ACCOUNT NO.:  [id number]  MEDICAL RECORD NO.:  [id number]          PATIENT TYPE:  OUT  LOCATION:  SLEEP CENTER                 FACILITY:  [facility]  PHYSICIAN:  Barbaraann Share, MD,FCCPDATE OF BIRTH:  Oct 02[birth year] 1943  DATE OF STUDY:  06/06/2010                           NOCTURNAL POLYSOMNOGRAM  REFERRING PHYSICIAN:  KAREN BOWEN  INDICATION FOR STUDY:  Hypersomnia with sleep apnea.  EPWORTH SLEEPINESS SCORE:  13.  MEDICATIONS:  SLEEP ARCHITECTURE:  The patient had a total sleep time of 338 minutes with no slow wave sleep and only 49 minutes of REM.  Sleep onset latency was normal at 23 minutes, and REM onset was prolonged at 328 minutes. Sleep efficiency was mildly reduced at 81%.  RESPIRATORY DATA:  The patient was found to have 3 central apneas and 14 obstructive hypopneas the entire night, giving her an apnea/hypopnea index of only 3 events per hour.  She was noted to have moderate snoring throughout the night.  OXYGEN DATA:  There was O2 desaturation as low as 87% that was very transient in nature.  She only spent 6 seconds the entire night less than 88%.  CARDIAC DATA:  Occasional PAC and PVC noted, but no clinically significant arrhythmias were seen.  MOVEMENT-PARASOMNIA:  The patient had no significant leg jerks or other abnormal behaviors noted.  IMPRESSIONS-RECOMMENDATIONS: 1. Small numbers of central and obstructive events which do not meet     the apnea/hypopnea index criteria for the obstructive sleep apnea     syndrome.  The patient did have moderate snoring.  If the patient     has clinically significant daytime hypersomnia that remains     unexplained, further investigation may be warranted. 2. Occasional premature atrial contraction and premature ventricular     contraction noted, but no clinically significant arrhythmias were     seen.     Barbaraann Share, MD,FCCP Diplomate, American Board of Sleep Medicine Electronically  Signed    KMC/MEDQ  D:  06/16/2010 07:57:56  T:  06/16/2010 10:03:17  Job:  [id number]

## 2010-06-27 ENCOUNTER — Telehealth: Payer: Self-pay | Admitting: Family Medicine

## 2010-06-27 NOTE — Assessment & Plan Note (Signed)
[facility]                                 ON-CALL NOTE   NAME:[name], [name]                     MRN:          [id number]  DATE:04/12/2007                            DOB:          11-08-1945    TIME OF CALL:  9:04 a.m.   PHONE NUMBER:  [phone]   OBJECTIVE:  The patient has a sinus infection.  Cared for her grand-  babies on Wednesday.  Has a sore throat Thursday.  Friday became more  congested.  Now is starting to have significant congestion and feels she  might have a sinus infection.  Has significant allergies.  Has been seen  by pulmonary.  Was told to come in to be seen at the [place] office.   PRIMARY CARE [place] Renteria:  Dr. Lovell Sheehan.  Home office is [place].     Arta Silence, MD  Electronically Signed    RNS/MedQ  DD: 04/12/2007  DT: 04/13/2007  Job #: [id number]

## 2010-06-27 NOTE — Telephone Encounter (Signed)
Pt called and said she spoke with Dr.Bowen's nurse last week and she was suppose to call her back to give sleep apnea results.  Still has not heard the results.  Please call.   Plan:  Pt notified with the results after Dr. Cathey Endow pulled up the report and verified the impressions/recommendations.  Basically normal study because not enough dsaturations to qualify for obstructive sleep apnea.  Pt voiced understanding. Jarvis Newcomer, LPN [facility]

## 2010-06-27 NOTE — Assessment & Plan Note (Signed)
[facility] [facility]                             PULMONARY OFFICE NOTE   NAME:STRAFACIJaeda, Bruso                   MRN:          [id number]  DATE:11/04/2006                            DOB:          [birth year]    [name] returns.  She is a 65 year old female with history of mild  intermittent asthma, allergic rhinitis, reflux disease, vocal cord  dysfunction syndrome.  Overall she has had increased cough and dyspnea  for the past month.   MEDICATIONS:  1. Pulmicort 2 sprays daily.  2. Flonase 2 sprays in each nostril daily.  3. BroveX  p.r.n.  4. Albuterol p.r.n.   PHYSICAL EXAMINATION:  This is a middle-aged female in no distress.  Temperature 98, blood pressure 116/70, pulse 71, saturation 96% on room  air.  CHEST:  Showed distant breath sounds, without evidence of wheeze or  rhonchi.  CARDIAC:  Showed a regular rate and rhythm without S3.  Normal S1 and  S2.  ABDOMEN:  Soft and nontender.  EXTREMITIES:  Showed no edema or clubbing.  SKIN:  Clear.  NEUROLOGIC:  Intact.  HEENT:  Showed mild nasal inflammation.  Oropharynx was clear.  NECK:  Supple.   PULMONARY FUNCTIONS OBTAINED:  FE 25/75, low at 60% predicted.  FEV-1  was 90% predicted.   IMPRESSION:  Moderate persistent asthma with mild flare.   PLAN:  Increase Pulmicort to 2 sprays b.i.d.  Refills were given.  We  will see the patient back in 6 weeks.     Charlcie Cradle Delford Field, MD, [facility]  Electronically Signed    PEW/MedQ  DD: 11/04/2006  DT: 11/05/2006  Job #: [id number]   cc:   Stacie Glaze, MD

## 2010-06-30 NOTE — Assessment & Plan Note (Signed)
[name]                               PULMONARY OFFICE NOTE   NAME:STRAFACILakeitha, [name]                   MRN:          [id number]  DATE:10/26/2005                            DOB:          1945-10-08    [name] is a 65 year old white female with a history of moderate  persistent asthma, reflux disease, vocal cord dysfunction syndrome.  The  patient's cough overall is improved, but not completely gone.  She maintains  Pulmicort 2 sprays daily, Zantac 150 mg daily, albuterol as needed.   PHYSICAL EXAMINATION:  VITAL SIGNS:  Temperature 97, blood pressure 108/70,  pulse 71, saturation 98% on room air.  CHEST:  Diminished breath sounds with prolonged expiratory phase.  No wheeze  or rhonchi noted.  CARDIAC:  Regular rate and rhythm without S3.  Normal S1 and S2.  ABDOMEN:  Soft, nontender.  EXTREMITIES:  No edema or clubbing.  SKIN:  Clear.  HEENT:  Nares were clear.   IMPRESSION:  Stable mild intermittent asthma with allergic rhinitis, reflux  disease and vocal cord dysfunction syndrome.  Resolved sinusitis.   PLAN:  The plan is for the patient to maintain Pulmicort 2 sprays daily, and  will see the patient back in return followup in 4 months.                                   Charlcie Cradle Delford Field, MD, FCCP   PEW/MedQ  DD:  10/26/2005  DT:  10/27/2005  Job #:  [id number]

## 2010-06-30 NOTE — Assessment & Plan Note (Signed)
[facility]                             PULMONARY OFFICE NOTE   NAME:[name], [name]                   MRN:          [id number]  DATE:03/12/2006                            DOB:          03/28/1945    HISTORY OF PRESENT ILLNESS:  The patient is a 65 -year-old white female  patient of Dr. Lynelle Doctor who has a known history of asthma and allergic  rhinitis with reflux disease and vocal cord dysfunction syndrome who  presents with an acute office visit complaining of a one week history of  nasal congestion, sinus pain and pressure, cough and wheezing with  hoarseness. The patient denies any hemoptysis, orthopnea, PND, or leg  swelling. The patient is currently on day 4 of 10 of Augmentin.   PAST MEDICAL HISTORY:  Reviewed.   CURRENT MEDICATIONS:  Reviewed.   PHYSICAL EXAMINATION:  The patient is pleasant female in no acute  distress. She is afebrile with stable vital signs. O2 saturation is 96%  on room air.  HEENT:  Nasal mucosa with some mild erythema. Nontender sinuses.  Posterior pharynx is clear.  NECK: Supple without adenopathy. No JVD.  LUNGS: Lung sounds are completely clear bilaterally. The patient had  some upper airway pseudo wheezing.  CARDIAC: Regular rate and rhythm.  ABDOMEN: Soft and nontender.  EXTREMITIES: Warm without edema.   IMPRESSION/PLAN:  Acute exacerbation of asthmatic bronchitis. The  patient is to finish out Augmentin as scheduled. Add in Mucinex D.M.,  Tussionex one teaspoon every 12 hours as needed for cough control. The  patient is to increase her Pulmicort up to two puffs twice daily. Return  back with Dr. Delford Field in 3 to 4 weeks or sooner if needed. The patient is  advised to rinse and gargle well after Pulmicort use.      Rubye Oaks, NP  Electronically Signed      Charlcie Cradle Delford Field, MD, [facility]  Electronically Signed   TP/MedQ  DD: 03/12/2006  DT: 03/12/2006  Job #: [id number]

## 2010-06-30 NOTE — Assessment & Plan Note (Signed)
[facility]                             PULMONARY OFFICE NOTE   NAME:Fry, [name] BOODY                   MRN:          [id number]  DATE:02/11/2006                            DOB:          [birth year]    HISTORY:  This is 65 year old white female former smoker who has  essentially normal PFTs, documented August 02, 2004 and yet complains of  chronic cough and frequent need for albuterol to supplement Pulmicort in  the setting of a chronic reflux disease with vocal cord dysfunction and  intermittent sinusitis.  She comes in today telling me that she is using  Mucinex about every day or every other day chronically and has done so  since her last visit to see Dr. Delford Field in September and now is acutely  ill for the last week, with a hacking cough productive of yellow sputum.  Her main complaint, it hurts all across her anterior chest, and she is  afraid of going pneumonia.  She has already been called in Avelox by  Dr. Kriste Basque and is on day 2 of therapy with resolution of fever and no  dyspnea or an increased need for albuterol.   For full employed medications, please see patient sheet dated February 11, 2006, but note that the patient is no longer taking AcipHex b.i.d.  but rather daily, not necessarily related to meals and Zantac at  bedtime.   PHYSICAL EXAMINATION:  GENERAL:  She is a anxious, pleasant white female  in no acute distress.  VITAL SIGNS:  Stable vital signs.  Saturation 97% on room air.  HEENT:  Reveals moderate turbinate, oropharynx is clear.  There is no  excessive post-angio drainage or cobblestoning.  Normal, except for  frequent throat pain.  NECK:  Supple without cervical adenopathy or tenderness.  Trachea is  midline, negative.  LUNGS:  Fields perfect air entry bilaterally.  HEART:  Regular rhythm without murmur, gallop or rub.  ABDOMEN:  Soft and nondistended.  EXTREMITIES:  No calf tenderness, cyanosis, clubbing or edema.   IMPRESSION:  1. Upper respiratory infection with purulent tracheal bronchitis,      rhinitis/tracheal bronchitis already addressed with Avelox and      plans to finish a total of 7 days of therapy.  2. The major concern she has today is a cough that actually was      present before her acute illness began and is much worse since her      acute illness, and in fact she coughs so hard she hurts.  One      issue is whether the chronic cough is related to poorly-controlled      reflux (which certainly could be the case, since she is no longer      taking AcipHex consistently before eating twice daily), and also      has been using over the counter cough lozenges.  I recommended she      eliminate cough lozenges and reviewed a diet with her and      recommended taking AcipHex 20 mg twice daily, 30 to 60 minutes  before meals, along with the Zantac at bedtime to see if this      controls the cough.  In the meantime, to control the cough acutely,      I recommended not only finishing the Avelox but taking Tramadol 50      mg one q.4 h. p.r.n., in addition to the Mucinex she has been using      over the counter (I would prefer DM, if she can tolerate it, over      plain Mucinex for cough suppressant purposes).   If this does not control the chronic cough, I might consider switching  from Pulmicort to Qvar, since my experience has been that the DTIs  contribute more to cough than aerosol inhalers and/or treat her reflux,  assuming her cough is related to asthma, and/or refer to Dr. Kinnie Scales for  more aggressive treatment directed at reflux chronically.   Finally, I did give her a 6-day course of prednisone to take, if all  else fails in terms of controlling the cough and her upper airways,  especially if this does go to her lungs, her symptoms go to her lungs,  which has been her experience in the past, indicating that she may have  secondary asthma, set off by viral URIs.     [name]  [name], MD, [facility]  Electronically Signed    MBW/MedQ  DD: 02/11/2006  DT: 02/11/2006  Job #: [id number]   cc:   Stacie Glaze, MD

## 2010-06-30 NOTE — Assessment & Plan Note (Signed)
[facility]                             PULMONARY OFFICE NOTE   NAME:STRAFACISamanthamarie, Ezzell                   MRN:          [id number]  DATE:06/17/2006                            DOB:          1945/10/28    [name] returns today in followup. This is a 65 year old white  female with a history of moderate-mild intermittent asthma, allergic  rhinitis, reflux disease, mild vocal cord dysfunction syndrome. Overall,  she is doing fairly well considering the allergy season has been  excessive. She has some post-nasal drainage. She is using the Brovex  p.r.n. and Mucinex 1-2 b.i.d. p.r.n.   Maintains:  1. Pulmicort two sprays daily.  2. Flonase two sprays each nostril daily.  3. Zegerid is 40 mg daily and this is controlling her reflux very      well.   PHYSICAL EXAMINATION:  Temperature 98.0, blood pressure 116/72, pulse  74, saturation was 98% room air.  In general, this is a well-developed, well-nourished white female in no  distress.  CHEST: Showed distant breath sounds. No evidence of wheeze or rhonchi.  CARDIAC: Showed a regular rate and rhythm without S3. Normal S1, S2.  ABDOMEN: Soft, nontender.  EXTREMITIES: Showed no edema or clubbing.  SKIN: Was clear.   IMPRESSION:  This patient has moderate-mild intermittent asthma,  allergic rhinitis, reflux disease.   PLAN:  For the patient is to maintain Pulmicort and Flonase as currently  dosed. Continue the Zegerid 40 mg daily, albuterol as needed and the  Brovex as needed. Refills on Brovex were given. Will see the patient  back in followup in three months.     Charlcie Cradle Delford Field, MD, [facility]  Electronically Signed    PEW/MedQ  DD: 06/17/2006  DT: 06/17/2006  Job #: [id number]   cc:   Stacie Glaze, MD

## 2010-06-30 NOTE — Assessment & Plan Note (Signed)
[name]                             PULMONARY OFFICE NOTE   NAME:[name], [name]                   MRN:          [id number]  DATE:03/26/2006                            DOB:          1945/03/14    [name] is a 65 year old white female, history of moderate  persistent asthma, reflux disease, vocal cord dysfunction syndrome.  She  has had a flare of her reflux disease more recently after an upper  respiratory tract infection in early January.  She is improving now.   She maintains:  1. Pulmicort 2 sprays daily.  2. Flonase 2 sprays each nostril daily.  3. She is now on Zegerid 40 mg daily and off of Zantac and Aciphex.   PHYSICAL EXAMINATION:  Temp 97, blood pressure 110/72, pulse 81,  saturation 95% on room air.  CHEST:  Showed to be completely clear without evidence of wheeze or  rhonchi.  CARDIAC:  Exam showed a regular rate and rhythm without S3, normal S1,  S2.  ABDOMEN:  Soft, nontender.  EXTREMITIES:  Showed no edema or clubbing.  SKIN:  Clear.  NEUROLOGIC:  Exam was intact.  HEENT:  Exam showed no jugular venous distention, no lymphadenopathy.   IMPRESSION:  For the patient to maintain inhaled medicines as currently  dosed and no change in plan of care is made.  We will see the patient  back in 3 months.     Charlcie Cradle Delford Field, MD, [facility]  Electronically Signed    PEW/MedQ  DD: 03/26/2006  DT: 03/27/2006  Job #: [id number]

## 2010-06-30 NOTE — Procedures (Signed)
[facility]  Patient:    [name], [name]                     MRN: [id number] Proc. Date: 10/20/99 Adm. Date:  [id number] Disc. Date: [id number] Attending:  Deneen Harts CC:         Veverly Fells. Arletha Grippe, M.D.  Stacie Glaze, M.D. [facility]   Procedure Report  PROCEDURE:  Panendoscopy.  INDICATIONS:  Evaluate 65 year old white female with symptoms of dysphonia. Seen previously by Dr. Arletha Grippe with laryngoscopy revealing acute laryngitis thought to be due to reflux.  Persistent hoarseness.  Patient having difficulty tolerating Nexium, Protonix, Prevacid, none of which she has been on for the past one to two weeks.  DESCRIPTION OF PROCEDURE:  After reviewing the nature of the procedure with the patient including potential risks and complications, and after discussing alternative methods of diagnosis and treatment, informed consent was signed.  The patient was premedicated receiving IV sedation totalling Versed 9 mg, Fentanyl 75 mcg IV.  Using an Olympus video endoscope the proximal esophagus was intubated under direct vision.  The oropharynx was normal.  Photo documentation of normal vocal cords obtained.  No lesion identified.  The scope advanced through the upper esophageal sphincter into the proximal esophagus without difficulty. Proximal mid and distal segments are normal.  There was no evidence of reflux disease with normal mucosa.  No inflammation, no erythema, edema, erosion or ulceration.  Mucosal Z-line distinct to 42 cm.  No hiatal hernia.  Scope advanced into the stomach.  The gastric fundus body and antrum were normal.  No evidence again of peptic inflammation or ulceration.  Pylorus symmetric, easily traversed.  The duodenal bulb and second portion were normal.  Retroflex view of the angularis, lesser curvature, cardia, fundus were negative.  The stomach was decompressed, scope withdrawn.  The patient tolerated the procedure without  difficulty, being maintained on Datascope monitor and low-flow oxygen throughout.  Return to recovery in stable condition.  ASSESSMENT:  Normal endoscopy without evidence of reflux disease.  RECOMMENDATIONS:  1. If hoarseness recurs, perform 24 hour intraesophageal pH profile off PPI therapy to document.  2. Antireflex measures.  3. Proceed with scheduling of colonoscopy, given family history of mother having had colon cancer and patients age of 63. DD:  10/20/99 TD:  10/22/99 Job: [id number]

## 2010-06-30 NOTE — Assessment & Plan Note (Signed)
[facility]                                 ON-CALL NOTE   NAME:STRAFACIKatilin, Raynes                     MRN:          [id number]  DATE:02/09/2006                            DOB:          07/07/1945    TIME OF CALL:  9:30 a.m.   PRIMARY CARE PHYSICIAN:  Dr. Shan Levans.   PHONE NUMBER:  [phone]   The patient called complaining of 1 week history of upper respiratory  tract infection.  States she was exposed to multiple family members with  a similar illness over Christmas Holidays.  She has a temperature of 100  feeling quite ill with head and chest congestion.  She states Dr. Delford Field  usually gives her Avelox and this works the best for her, and she is  requesting this to be called in.   Avelox 400 mg #7 called to the [facility] on [address] at [phone] per  request.  The patient was encouraged to call Dr. Delford Field after February 13, 2006 for followup visit.     Lonzo Cloud. Kriste Basque, MD  Electronically Signed    SMN/MedQ  DD: 02/09/2006  DT: 02/09/2006  Job #: [id number]

## 2010-06-30 NOTE — Assessment & Plan Note (Signed)
[facility]                               PULMONARY OFFICE NOTE   NAME:[name], [name]                   MRN:          [id number]  DATE:08/22/2005                            DOB:          11-Mar-1945    [name] is a 65 year old white female with history of moderate  persistent asthma, now noting increased cough with a thin white mucus,  increased nasal drainage for the past week. She maintains Pulmicort two  sprays daily and albuterol p.r.n.  Other maintenance medications are listed  in the chart and are correct as reviewed.   PHYSICAL EXAMINATION:  GENERAL: On exam, this is a middle-age female in no  distress.  VITAL SIGNS:  Temperature 98, blood pressure 108/68, pulse 76, saturation  97% on room air.  CHEST:  Diminished breath sounds with prolonged expiratory phase.  No wheeze  or rhonchi noted.  CARDIAC:  Regular rate and rhythm without S3.  Normal S1 and S2.  ABDOMEN:  Soft, nontender.  EXTREMITIES:  No edema or clubbing.  SKIN:  Clear.  NEUROLOGIC:  Intact.  HEENT:  Nasal inflammation.   IMPRESSION:  Acute asthma flare with acute bronchitis.   PLAN:  The patient is to receive doxycycline 100 mg b.i.d. for a seven day  course. The patient is also to receive pulse prednisone 40 mg daily tapering  down to 10 mg over two days until off, and increase Pulmicort to three  sprays b.i.d. for one week, then three sprays daily for one week, then two  sprays daily there after.  Will see the patient back in return follow-up in  one month.                                   Charlcie Cradle Delford Field, MD, FCCP   PEW/MedQ  DD:  08/22/2005  DT:  08/22/2005  Job #:  [id number]

## 2010-07-24 ENCOUNTER — Telehealth: Payer: Self-pay | Admitting: Family Medicine

## 2010-07-24 NOTE — Telephone Encounter (Signed)
Pt called ans is complaining of RT ear pain and it is uncomfortable.   Plan:  Notified the pt and told her to sched an appt.to be seen. Jarvis Newcomer, LPN [facility]

## 2010-07-28 ENCOUNTER — Encounter: Payer: Self-pay | Admitting: Family Medicine

## 2010-07-28 ENCOUNTER — Ambulatory Visit (INDEPENDENT_AMBULATORY_CARE_PROVIDER_SITE_OTHER): Payer: Medicare Other | Admitting: Family Medicine

## 2010-07-28 DIAGNOSIS — J019 Acute sinusitis, unspecified: Secondary | ICD-10-CM

## 2010-07-28 MED ORDER — AMOXICILLIN-POT CLAVULANATE 875-125 MG PO TABS: 1.0000 | ORAL_TABLET | Freq: Two times a day (BID) | ORAL | Status: AC

## 2010-07-28 NOTE — Patient Instructions (Signed)
Stay on allergy meds.  Take Augmentin 2 x a day with food for sinusitis and f/u with Dr Jac Canavan for your ear.   Call Dr Thomasena Edis for f/u knee arthritis about doing the synvisc injections.  Elevate legs and stick to a low sodium diet for leg swelling.

## 2010-07-28 NOTE — Progress Notes (Signed)
  Subjective:    Patient ID: [name], female    DOB: 01/10/1946, 65 y.o.   MRN: [id number]  HPI  65 yo WF presents for L ear pain.  She has been set up to see Dr Jac Canavan for her ear.  She has an appt July 25th.  Currently, she has shooting pain in the L ear.  She feels pressure in her head.  Using simply saline.  She is taking Allegra and using Flonase.  She is taking Mucinex.  Denies fevers or chills.  Has really strugged w/ her allergies this year.  Still has rhinorrhea and postnasal drip even on meds.    BP 130/81  Pulse 94  Temp(Src) 99 F (37.2 C) (Oral)  Ht 5\' 3"  (1.6 m)  Wt 173 lb (78.472 kg)  BMI 30.65 kg/m2  SpO2 96%   Review of Systems  Constitutional: Negative for chills, appetite change and fatigue.  HENT: Positive for ear pain, congestion, rhinorrhea, sneezing, postnasal drip and sinus pressure. Negative for sore throat and tinnitus.   Eyes: Negative for itching.  Respiratory: Negative for cough and shortness of breath.   Cardiovascular: Negative for chest pain and palpitations.  Gastrointestinal: Negative for abdominal pain.  Skin: Negative for rash.  Neurological: Negative for light-headedness and headaches.       Objective:   Physical Exam  Constitutional: She appears well-developed and well-nourished. No distress.  HENT:  Head: Normocephalic and atraumatic.  Right Ear: External ear normal.  Left Ear: External ear normal.  Mouth/Throat: Oropharynx is clear and moist.       Clear postnasal drip, slight maxillary sinus TTP, no o/p injection.  Nasal discharge and congestion present  Eyes: Conjunctivae are normal.       Both eyes slightly watery, no scleral injection  Cardiovascular: Normal rate, regular rhythm and normal heart sounds.   Pulmonary/Chest: Effort normal and breath sounds normal.  Lymphadenopathy:    She has no cervical adenopathy.  Skin: Skin is warm and dry.          Assessment & Plan:  Sinusitis - causing ear pressure.    Secondary to underlying allergies, not responding to allegra + flonase likely due to allergy overload this year.  She has f/u with Dr Jac Canavan in the next 2 wks to check her ears.  Start Augmentin today and stay on allergy meds.  Call if any problems.

## 2010-07-30 ENCOUNTER — Telehealth: Payer: Self-pay | Admitting: Family Medicine

## 2010-07-30 MED ORDER — AMBULATORY NON FORMULARY MEDICATION: Status: DC

## 2010-07-30 NOTE — Telephone Encounter (Signed)
Pls print out the RX for Ketoprofen: Diclofenac topical and fax to [facility].

## 2010-07-31 MED ORDER — AMBULATORY NON FORMULARY MEDICATION: Status: DC

## 2010-09-04 ENCOUNTER — Ambulatory Visit (INDEPENDENT_AMBULATORY_CARE_PROVIDER_SITE_OTHER): Payer: Medicare Other | Admitting: Critical Care Medicine

## 2010-09-04 ENCOUNTER — Encounter: Payer: Self-pay | Admitting: Critical Care Medicine

## 2010-09-04 DIAGNOSIS — J0100 Acute maxillary sinusitis, unspecified: Secondary | ICD-10-CM

## 2010-09-04 DIAGNOSIS — E785 Hyperlipidemia, unspecified: Secondary | ICD-10-CM

## 2010-09-04 MED ORDER — FISH OIL 1000 MG PO CAPS: ORAL_CAPSULE | ORAL | Status: DC

## 2010-09-04 MED ORDER — PREDNISONE 10 MG PO TABS: ORAL_TABLET | ORAL | Status: DC

## 2010-09-04 MED ORDER — AZITHROMYCIN 250 MG PO TABS: ORAL_TABLET | ORAL | Status: DC

## 2010-09-04 NOTE — Progress Notes (Signed)
Subjective:    Patient ID: [name], female    DOB: 11/08/1945, 65 y.o.   MRN: [id number]  Cough This is a new problem. The current episode started 1 to 4 weeks ago. The problem has been rapidly worsening. The problem occurs every few minutes. The cough is productive of sputum (mucus now is yellow, occasionally not productive). Associated symptoms include chest pain, headaches, nasal congestion, postnasal drip, rhinorrhea, shortness of breath and wheezing. Pertinent negatives include no chills, ear congestion, ear pain, fever, heartburn, hemoptysis, myalgias, sore throat, sweats or weight loss. Associated symptoms comments: Has chest pain with cough. Her past medical history is significant for asthma and environmental allergies.    F/U of   65 y.o.    white female with chronic allergic rhinitis and extrinsic asthma. The patient also has associated vocal cord dysfunction syndrome and reflux disease.   05/08/10 At last ov 2/12 we rec: 1) Ceftin (generic) one twice a day for 7days  2) Stop amoxicillin  3) Stop allegra  4) Stop prednisone  5) Use Flonase two sprays each nostril daily  6) Neil Med nasal rinse twice a day for 10days  Pt did well until 5 days ago when she developed more    Symptoms of severe sneezing, low grade fever,  Pt  Then had  runny nose,  Pt noted creamy to yellow mucus out of the nares  but no true cough, pt noted  sl hoarseness  All sinus type issues and then ache all over then temp to 101.2.  I called in augmentin and pred and pt is better now one week ago. Has a sleep study test pending scheduled soon  Symptoms have improved  09/04/2010 Started with cough, then worse overtime , symptoms are cough prod of yellow, postnasal drip, nasal bleeding, coughing every few minutes. See cough form above.  Past Medical History  Diagnosis Date  . Other and unspecified hyperlipidemia   . Unspecified arthropathy, hand   . Acute bronchitis   . Peptic ulcer, unspecified  site, unspecified as acute or chronic, without mention of hemorrhage, perforation, or obstruction   . Allergic rhinitis, cause unspecified   . Unspecified asthma   . Benign paroxysmal positional vertigo   . Depressive disorder, not elsewhere classified   . Esophageal reflux   . Unspecified hypothyroidism   . Localized osteoarthrosis not specified whether primary or secondary, lower leg   . Other diseases of vocal cords      Family History  Problem Relation Age of Onset  . Heart disease Father   . Colon cancer Mother   . Liver cancer Brother   . Asthma Brother      History   Social History  . Marital Status: Married    Spouse Name: N/A    Number of Children: 3  . Years of Education: N/A   Occupational History  . Retired    Social History Main Topics  . Smoking status: Former Smoker -- 0.3 packs/day for 2 years    Types: Cigarettes    Quit date: 02/12/1958  . Smokeless tobacco: Never Used  . Alcohol Use: Yes     wine socially  . Drug Use: No  . Sexually Active: Not on file   Other Topics Concern  . Not on file   Social History Narrative  . No narrative on file     Allergies  Allergen Reactions  . Morphine     REACTION: Nausea  . Moxifloxacin  REACTION: confusion, dizziness, paranoia     Outpatient Prescriptions Prior to Visit  Medication Sig Dispense Refill  . acetaminophen (TYLENOL) 325 MG suppository Place 1 suppository (325 mg total) rectally every 4 (four) hours as needed for fever.  12 suppository  0  . albuterol (PROVENTIL HFA) 108 (90 BASE) MCG/ACT inhaler Inhale 2 puffs into the lungs every 6 (six) hours as needed.        . AMBULATORY NON FORMULARY MEDICATION Medication Name: Ketoprofen 2% : Diclofenac 2%gel  Apply 0.5 cc to affected joints 2-3 x a day as needed for pain  1 Bottle  1  . Ascorbic Acid (VITAMIN C) 500 MG tablet Take 500 mg by mouth 2 (two) times daily.        . Biotin (BIOTIN 5000) 5 MG CAPS Take 1 capsule by mouth daily.        .  budesonide (PULMICORT FLEXHALER) 180 MCG/ACT inhaler Inhale 2 puffs into the lungs daily.       Marland Kitchen buPROPion (WELLBUTRIN XL) 150 MG 24 hr tablet Take 150 mg by mouth daily.        . Calcium Carbonate-Vit D-Min 600-400 MG-UNIT TABS 1 tablet by mouth 2-3 times daily      . estradiol (MENOSTAR) 14 MCG/24HR Place 1 patch onto the skin once a week. Apply the patch to a clean, dry, non-oily skin area of your lower abdomen, hips below the waist, or buttocks that has little or no hair and is free of cuts or irritation.      . fexofenadine (ALLEGRA) 180 MG tablet Take 180 mg by mouth at bedtime.        . fluticasone (FLONASE) 50 MCG/ACT nasal spray 2 sprays by Nasal route daily.        . folic acid (FOLVITE) 1 MG tablet Take 1 mg by mouth 2 (two) times daily.        . Ginkgo Biloba 40 MG TABS Take 1 tablet by mouth 3 (three) times daily. As needed      . Glucosamine HCl 1500 MG TABS Take 1 tablet by mouth daily.        . Guaifenesin (MUCINEX MAXIMUM STRENGTH) 1200 MG TB12 Take 1 tablet by mouth 2 (two) times daily. As needed      . levothyroxine (SYNTHROID, LEVOTHROID) 75 MCG tablet Take 75 mcg by mouth daily. Except Sunday        . levothyroxine (SYNTHROID, LEVOTHROID) 88 MCG tablet Take only sunday       . LYSINE PO Once daily       . metroNIDAZOLE (METROGEL) 1 % gel Apply topically daily.        Marland Kitchen omeprazole-sodium bicarbonate (ZEGERID) 40-1100 MG per capsule Take 1 capsule by mouth daily before breakfast.        . PARoxetine (PAXIL-CR) 12.5 MG 24 hr tablet Take 12.5 mg by mouth 2 (two) times daily.        . sodium chloride (BRONCHO SALINE) inhaler solution Saline wash twice daily      . Cholecalciferol (VITAMIN D3) 3000 UNITS TABS 1 tablet by mouth once daily       . predniSONE (DELTASONE) 10 MG tablet Take 4 tablets a day x 2 days, 3 tablets a day x 2 days, 2 tablets a day x 2 days, 1 tablet a day x 2 days and then stop  21 tablet  0     Review of Systems  Constitutional: Negative for fever,  chills and weight loss.  HENT: Positive for rhinorrhea and postnasal drip. Negative for ear pain and sore throat.   Respiratory: Positive for cough, shortness of breath and wheezing. Negative for hemoptysis.   Cardiovascular: Positive for chest pain.  Gastrointestinal: Negative for heartburn.  Musculoskeletal: Negative for myalgias.  Neurological: Positive for headaches.  Hematological: Positive for environmental allergies.       Objective:   Physical Exam  Gen: Pleasant, well-nourished, in no distress,  normal affect  ENT: R > L nares purulence and post nasal drip  Neck: No JVD, no TMG, no carotid bruits  Lungs: No use of accessory muscles, no dullness to percussion, no wheeze except mild pseudowheeze.  Cardiovascular: RRR, heart sounds normal, no murmur or gallops, no peripheral edema  Abdomen: soft and NT, no HSM,  BS normal  Musculoskeletal: No deformities, no cyanosis or clubbing  Neuro: alert, non focal  Skin: Warm, no lesions or rashes    No images are attached to the encounter.        Assessment & Plan:   Acute maxillary sinusitis Hx of chronic recurrent sinusitis, new episode/flare 7/12 Clear R max sinusitis flare Plan Azithromycin x 5 days depomedrol 125mg  IM Hold astepro/allegra for 7days     Updated Medication List Outpatient Encounter Prescriptions as of 09/04/2010  Medication Sig Dispense Refill  . acetaminophen (TYLENOL) 325 MG suppository Place 1 suppository (325 mg total) rectally every 4 (four) hours as needed for fever.  12 suppository  0  . albuterol (PROVENTIL HFA) 108 (90 BASE) MCG/ACT inhaler Inhale 2 puffs into the lungs every 6 (six) hours as needed.        . AMBULATORY NON FORMULARY MEDICATION Medication Name: Ketoprofen 2% : Diclofenac 2%gel  Apply 0.5 cc to affected joints 2-3 x a day as needed for pain  1 Bottle  1  . Ascorbic Acid (VITAMIN C) 500 MG tablet Take 500 mg by mouth 2 (two) times daily.        . Biotin (BIOTIN 5000)  5 MG CAPS Take 1 capsule by mouth daily.        . budesonide (PULMICORT FLEXHALER) 180 MCG/ACT inhaler Inhale 2 puffs into the lungs daily.       Marland Kitchen buPROPion (WELLBUTRIN XL) 150 MG 24 hr tablet Take 150 mg by mouth daily.        . Calcium Carbonate-Vit D-Min 600-400 MG-UNIT TABS 1 tablet by mouth 2-3 times daily      . Cholecalciferol (VITAMIN D3) 2000 UNITS TABS Take 1 tablet by mouth daily.        . clonazePAM (KLONOPIN) 0.5 MG tablet 1/4 by mouth daily as needed      . estradiol (MENOSTAR) 14 MCG/24HR Place 1 patch onto the skin once a week. Apply the patch to a clean, dry, non-oily skin area of your lower abdomen, hips below the waist, or buttocks that has little or no hair and is free of cuts or irritation.      . fexofenadine (ALLEGRA) 180 MG tablet Take 180 mg by mouth at bedtime.        . fluticasone (FLONASE) 50 MCG/ACT nasal spray 2 sprays by Nasal route daily.        . folic acid (FOLVITE) 1 MG tablet Take 1 mg by mouth 2 (two) times daily.        . Ginkgo Biloba 40 MG TABS Take 1 tablet by mouth 3 (three) times daily. As needed      . Glucosamine HCl 1500 MG TABS Take  1 tablet by mouth daily.        . Guaifenesin (MUCINEX MAXIMUM STRENGTH) 1200 MG TB12 Take 1 tablet by mouth 2 (two) times daily. As needed      . levothyroxine (SYNTHROID, LEVOTHROID) 75 MCG tablet Take 75 mcg by mouth daily. Except Sunday        . levothyroxine (SYNTHROID, LEVOTHROID) 88 MCG tablet Take only sunday       . LYSINE PO Once daily       . metroNIDAZOLE (METROGEL) 1 % gel Apply topically daily.        . Omega-3 Fatty Acids (FISH OIL) 1000 MG CAPS Hold for 10days then resume      . omeprazole-sodium bicarbonate (ZEGERID) 40-1100 MG per capsule Take 1 capsule by mouth daily before breakfast.        . PARoxetine (PAXIL-CR) 12.5 MG 24 hr tablet Take 12.5 mg by mouth 2 (two) times daily.        . sodium chloride (BRONCHO SALINE) inhaler solution Saline wash twice daily      . DISCONTD: Omega-3 Fatty Acids  (FISH OIL) 1000 MG CAPS Take 1 capsule by mouth 2 (two) times daily.        Marland Kitchen azithromycin (ZITHROMAX) 250 MG tablet Take two once then one daily until gone  6 each  0  . predniSONE (DELTASONE) 10 MG tablet Take 4 for two days, three for two days, two for two days, then one for two days and stop   20 tablet  0  . DISCONTD: Cholecalciferol (VITAMIN D3) 3000 UNITS TABS 1 tablet by mouth once daily       . DISCONTD: predniSONE (DELTASONE) 10 MG tablet Take 4 tablets a day x 2 days, 3 tablets a day x 2 days, 2 tablets a day x 2 days, 1 tablet a day x 2 days and then stop  21 tablet  0

## 2010-09-04 NOTE — Patient Instructions (Signed)
Hold fish oil for 10days then resume Stay on pulmicort daily Take azithromycin two once then one daily until gone Prednisone 10mg  Take 4 for two days, three for two days, two for two days, then one for two days and stop Use mucinex DM one or two twice daily as needed for cough Return if unimproved, otherwise return 4 months for followup

## 2010-09-04 NOTE — Assessment & Plan Note (Signed)
Hx of chronic recurrent sinusitis, new episode/flare 7/12 Clear R max sinusitis flare Plan Azithromycin x 5 days depomedrol 125mg  IM Hold astepro/allegra for 7days

## 2010-09-20 ENCOUNTER — Telehealth: Payer: Self-pay | Admitting: Critical Care Medicine

## 2010-09-21 MED ORDER — BENZONATATE 200 MG PO CAPS: 200.0000 mg | ORAL_CAPSULE | Freq: Three times a day (TID) | ORAL | Status: AC | PRN

## 2010-09-21 NOTE — Telephone Encounter (Signed)
Spoke with pt. She is c/o prod cough with clear sputum, taking mucinex with little relief. She states that her breathing is doing okay, no wheezing. She states that she had fever yesterday. No fever today. She has rx for tessalon, but it has expired. Wants recs from PW. Please advise thanks!

## 2010-09-21 NOTE — Telephone Encounter (Signed)
OK to refill her previous tessalon script

## 2010-09-21 NOTE — Telephone Encounter (Signed)
Spoke with pt and notified that rx for tessalon was sent to pharm. Pt verbalized understanding.

## 2010-09-21 NOTE — Telephone Encounter (Signed)
Dr. Delford Field is out of office the rest of the day.  Will forward message to doc of the day to address -- CDY, pls advise.  Thanks!

## 2010-10-02 ENCOUNTER — Other Ambulatory Visit: Payer: Self-pay | Admitting: Family Medicine

## 2010-10-02 ENCOUNTER — Ambulatory Visit (INDEPENDENT_AMBULATORY_CARE_PROVIDER_SITE_OTHER): Payer: Medicare Other | Admitting: Family Medicine

## 2010-10-02 ENCOUNTER — Encounter: Payer: Self-pay | Admitting: Family Medicine

## 2010-10-02 DIAGNOSIS — N951 Menopausal and female climacteric states: Secondary | ICD-10-CM

## 2010-10-02 DIAGNOSIS — Z130 Encounter for screening for diseases of the blood and blood-forming organs and certain disorders involving the immune mechanism: Secondary | ICD-10-CM

## 2010-10-02 DIAGNOSIS — R5383 Other fatigue: Secondary | ICD-10-CM

## 2010-10-02 DIAGNOSIS — R232 Flushing: Secondary | ICD-10-CM

## 2010-10-02 DIAGNOSIS — Z79899 Other long term (current) drug therapy: Secondary | ICD-10-CM

## 2010-10-02 DIAGNOSIS — R600 Localized edema: Secondary | ICD-10-CM

## 2010-10-02 DIAGNOSIS — R609 Edema, unspecified: Secondary | ICD-10-CM

## 2010-10-02 MED ORDER — AMBULATORY NON FORMULARY MEDICATION: Status: DC

## 2010-10-02 NOTE — Progress Notes (Signed)
  Subjective:    Patient ID: [name], female    DOB: 05-09-1945, 65 y.o.   MRN: [id number]  HPI Dr. Delford Field is treating her chronic cough  Having hot flashes during the day and sometimes at night.  Says weighs more than she used to and is not exercising. She is now eating a gluten free diet for 3 mo ago.  She says she eats between 1300-1600 calories a day.  Started noticing weight gain. Last period was in July 1997.  Drank soy milk for years.  Had her thyroid check in July.  She says she really watches her salt intake and says she notices her ankles were swollen.   Feels the swelling is better today.  She has been taking her mucinex. She feels she has had a lot of SOB. No chest tightness. No chest pain.  Saw Cardiology and had echo stress tet performed.  She sees endocrinology for her thyroid and had a normal test about a months ago.    Had a sleep apnea test recently that was neg. Can fall asleep easily.  She feels she doesn' need more sleep than most people.  She averages about 7 hours but prefers 9 hours.     Review of Systems     Objective:   Physical Exam  Constitutional: She is oriented to person, place, and time. She appears well-developed and well-nourished.  HENT:  Head: Normocephalic and atraumatic.  Eyes: Conjunctivae are normal. Pupils are equal, round, and reactive to light.  Neck: Neck supple. No thyromegaly present.  Cardiovascular: Normal rate, regular rhythm, normal heart sounds and intact distal pulses.   Pulmonary/Chest: Effort normal and breath sounds normal.  Musculoskeletal: She exhibits no edema.  Lymphadenopathy:    She has no cervical adenopathy.  Neurological: She is alert and oriented to person, place, and time.  Skin: Skin is warm and dry.  Psychiatric: She has a normal mood and affect. Her behavior is normal.          Assessment & Plan:  Weight Gain - discussed the need for more exercise and sleep.  Also will recheck her thyroid and make sure  she is not anemia. If normal can consider dscussing with her endocrine as well.   LE edema - has resolved on its own. She says she is already watching the salt in her diet.  Will rule out anemia and electrolyte disturbance. She has not had any CP with it.

## 2010-10-03 ENCOUNTER — Telehealth: Payer: Self-pay | Admitting: Family Medicine

## 2010-10-03 LAB — TSH: TSH: 0.576 u[IU]/mL (ref 0.350–4.500)

## 2010-10-03 LAB — COMPLETE METABOLIC PANEL WITH GFR
Albumin: 4.2 g/dL (ref 3.5–5.2)
BUN: 22 mg/dL (ref 6–23)
CO2: 27 mEq/L (ref 19–32)
GFR, Est African American: >60 mL/min (ref >60)
GFR, Est Non African American: >60 mL/min (ref >60)
Glucose, Bld: 76 mg/dL (ref 70–99)
Potassium: 4.4 mEq/L (ref 3.5–5.3)
Sodium: 140 mEq/L (ref 135–145)
Total Bilirubin: 0.4 mg/dL (ref 0.3–1.2)
Total Protein: 6.3 g/dL (ref 6.0–8.3)

## 2010-10-03 LAB — CBC WITH DIFFERENTIAL/PLATELET
Basophils Absolute: 0 10*3/uL (ref 0.0–0.1)
Basophils Relative: 1 % (ref 0–1)
Eosinophils Absolute: 0.3 10*3/uL (ref 0.0–0.7)
MCH: 28 pg (ref 26.0–34.0)
MCHC: 31.4 g/dL (ref 30.0–36.0)
Neutro Abs: 3.1 10*3/uL (ref 1.7–7.7)
Neutrophils Relative %: 57 % (ref 43–77)
Platelets: 345 10*3/uL (ref 150–400)

## 2010-10-03 LAB — VITAMIN B12: Vitamin B-12: 833 pg/mL (ref 211–911)

## 2010-10-03 NOTE — Telephone Encounter (Signed)
Left message for her to call back

## 2010-10-03 NOTE — Telephone Encounter (Signed)
With the lab and see if they can add an acute hepatitis panel.

## 2010-10-03 NOTE — Telephone Encounter (Signed)
Notified client services with [facility] and added on the acute hepatitis panel per the provider order's.  Used 790.5 as diagnosis code. Jarvis Newcomer, LPN [facility]

## 2010-10-03 NOTE — Telephone Encounter (Signed)
Pt calling for her lab results.  Returning call from earlier. Plan:  Pt notified with her results and pt has never had a workup for her liver so notified the provider of this information. Routed to Dr. Marlyne Beards, LPN [facility]

## 2010-10-03 NOTE — Telephone Encounter (Signed)
Call pt: Vit B12 is nl.  Complete blood count is normal, except for her his eosinophils are just slightly elevated. This is often elevated in people who have allergies. It appears that this was elevated to 4 years ago as well. Thyroid is normal. Complete metabolic panel is normal as well except for one of her liver enzymes is just borderline elevated. Evidently this was also p[reviosly elevated 4 years ago, thus this is stable. Has she ever had any workup of her liver before?

## 2010-10-04 ENCOUNTER — Telehealth: Payer: Self-pay | Admitting: Family Medicine

## 2010-10-04 LAB — HEPATITIS PANEL, ACUTE: HCV Ab: NEGATIVE

## 2010-10-04 NOTE — Telephone Encounter (Signed)
Call pt; Acute hep panel is neg. Recheck liver enzymes in 6 months to make sure stable. Avoid excess tylenol and alcohol.

## 2010-10-05 ENCOUNTER — Telehealth: Payer: Self-pay | Admitting: Family Medicine

## 2010-10-05 NOTE — Telephone Encounter (Signed)
Pt notified of results. KJ LPN 

## 2010-10-09 NOTE — Telephone Encounter (Signed)
Closed

## 2010-10-20 ENCOUNTER — Encounter: Payer: Self-pay | Admitting: Cardiology

## 2010-10-25 ENCOUNTER — Encounter: Payer: Self-pay | Admitting: Cardiology

## 2010-10-25 ENCOUNTER — Encounter: Payer: Self-pay | Admitting: Family Medicine

## 2010-10-25 ENCOUNTER — Ambulatory Visit (INDEPENDENT_AMBULATORY_CARE_PROVIDER_SITE_OTHER): Payer: Medicare Other | Admitting: Cardiology

## 2010-10-25 DIAGNOSIS — R0602 Shortness of breath: Secondary | ICD-10-CM

## 2010-10-25 DIAGNOSIS — R079 Chest pain, unspecified: Secondary | ICD-10-CM | POA: Insufficient documentation

## 2010-10-25 DIAGNOSIS — R748 Abnormal levels of other serum enzymes: Secondary | ICD-10-CM | POA: Insufficient documentation

## 2010-10-25 DIAGNOSIS — R0600 Dyspnea, unspecified: Secondary | ICD-10-CM | POA: Insufficient documentation

## 2010-10-25 DIAGNOSIS — E785 Hyperlipidemia, unspecified: Secondary | ICD-10-CM

## 2010-10-25 DIAGNOSIS — R0609 Other forms of dyspnea: Secondary | ICD-10-CM

## 2010-10-25 NOTE — Assessment & Plan Note (Signed)
This is a chronic problem but increased recently. Stress echocardiogram to quantify LV function and to exclude ischemia. Check BNP. There may be a component of asthma.

## 2010-10-25 NOTE — Progress Notes (Signed)
HPI: Pleasant female that I have seen in the past for atypical chest pain. I last saw her in June of 2010. An echocardiogram was performed on Jul 07, 2008. This showed normal LV function, mild aortic insufficiency, and mild left atrial enlargement. She also had a stress echocardiogram that showed no chest pain, no electrocardiographic changes, and no stress-induced wall motion abnormalities. Since I last saw her, She has multiple complaints. She has increased dyspnea on exertion. This has been a chronic issue but worse recently. No orthopnea or PND. Occasional mild pedal edema. No syncope. She also has a tightness in her chest. This can occur either with exertion or at rest. It is not pleuritic. She also has a gurgling sensation in her chest when she lays down. She also has fatigue.  Current Outpatient Prescriptions  Medication Sig Dispense Refill  . albuterol (PROVENTIL HFA) 108 (90 BASE) MCG/ACT inhaler Inhale 2 puffs into the lungs every 6 (six) hours as needed.        . AMBULATORY NON FORMULARY MEDICATION Medication Name: Ketoprofen 2% : Diclofenac 2%gel  Apply 0.5 cc to affected joints 2-3 x a day as needed for pain  1 Bottle  1  . AMBULATORY NON FORMULARY MEDICATION Medication Name: Zostavax. IM x 1  1 vial  0  . Ascorbic Acid (VITAMIN C) 500 MG tablet Take 500 mg by mouth 2 (two) times daily.        . ASTRAGALUS PO Take 1 capsule by mouth daily.        . Biotin (BIOTIN 5000) 5 MG CAPS Take 1 capsule by mouth daily.        . budesonide (PULMICORT FLEXHALER) 180 MCG/ACT inhaler Inhale 2 puffs into the lungs daily.       Marland Kitchen buPROPion (WELLBUTRIN XL) 150 MG 24 hr tablet Take 150 mg by mouth daily.        . Calcium Carbonate-Vit D-Min 600-400 MG-UNIT TABS 1 tablet by mouth 2-3 times daily      . Cholecalciferol (VITAMIN D3) 2000 UNITS TABS Take 1 tablet by mouth daily.        . clonazePAM (KLONOPIN) 0.5 MG tablet 1/4 by mouth daily as needed      . estradiol (MENOSTAR) 14 MCG/24HR Place 1 patch onto  the skin once a week. Apply the patch to a clean, dry, non-oily skin area of your lower abdomen, hips below the waist, or buttocks that has little or no hair and is free of cuts or irritation.      . fexofenadine (ALLEGRA) 180 MG tablet Take 180 mg by mouth at bedtime.        . fluticasone (FLONASE) 50 MCG/ACT nasal spray 2 sprays by Nasal route daily.        . folic acid (FOLVITE) 1 MG tablet Take 1 mg by mouth 2 (two) times daily.        . Ginkgo Biloba 40 MG TABS Take 1 tablet by mouth 3 (three) times daily. As needed      . Glucosamine HCl 1500 MG TABS Take 1 tablet by mouth daily.        . Guaifenesin (MUCINEX MAXIMUM STRENGTH) 1200 MG TB12 Take 1 tablet by mouth 2 (two) times daily. As needed      . levothyroxine (SYNTHROID, LEVOTHROID) 75 MCG tablet Take 75 mcg by mouth daily. Except Sunday        . levothyroxine (SYNTHROID, LEVOTHROID) 88 MCG tablet Take only sunday       .  LYSINE PO Once daily       . metroNIDAZOLE (METROGEL) 1 % gel Apply topically daily.        . Multiple Vitamin (MULTIVITAMIN) tablet Take 1 tablet by mouth daily.        . Omega-3 Fatty Acids (FISH OIL) 1000 MG CAPS Hold for 10days then resume      . omeprazole-sodium bicarbonate (ZEGERID) 40-1100 MG per capsule Take 1 capsule by mouth daily before breakfast.        . PARoxetine (PAXIL-CR) 12.5 MG 24 hr tablet Take 12.5 mg by mouth 2 (two) times daily.        . sodium chloride (BRONCHO SALINE) inhaler solution Saline wash twice daily         Past Medical History  Diagnosis Date  . Other and unspecified hyperlipidemia   . Unspecified arthropathy, hand   . Peptic ulcer, unspecified site, unspecified as acute or chronic, without mention of hemorrhage, perforation, or obstruction   . Allergic rhinitis, cause unspecified   . Unspecified asthma   . Benign paroxysmal positional vertigo   . Depressive disorder, not elsewhere classified   . Esophageal reflux   . Unspecified hypothyroidism   . Localized osteoarthrosis  not specified whether primary or secondary, lower leg   . Other diseases of vocal cords     Past Surgical History  Procedure Date  . Nasal sinus surgery   . Knee arthroscopy   . Rotator cuff repair     History   Social History  . Marital Status: Married    Spouse Name: N/A    Number of Children: 3  . Years of Education: N/A   Occupational History  . Retired    Social History Main Topics  . Smoking status: Former Smoker -- 0.3 packs/day for 2 years    Types: Cigarettes    Quit date: 02/12/1958  . Smokeless tobacco: Never Used  . Alcohol Use: Yes     wine socially  . Drug Use: No  . Sexually Active: Not on file   Other Topics Concern  . Not on file   Social History Narrative  . No narrative on file    ROS: no fevers or chills, productive cough, hemoptysis, dysphasia, odynophagia, melena, hematochezia, dysuria, hematuria, rash, seizure activity, orthopnea, PND, pedal edema, claudication. Remaining systems are negative.  Physical Exam: Well-developed well-nourished in no acute distress.  Skin is warm and dry.  HEENT is normal.  Neck is supple. No thyromegaly.  Chest is clear to auscultation with normal expansion.  Cardiovascular exam is regular rate and rhythm.  Abdominal exam nontender or distended. No masses palpated. Extremities show no edema. neuro grossly intact  ECG NSR, LAD, IRBBB, no ST changes

## 2010-10-25 NOTE — Assessment & Plan Note (Signed)
Management per primary care. 

## 2010-10-25 NOTE — Assessment & Plan Note (Signed)
Symptoms atypical. Question related to asthma. Plan stress echocardiogram.

## 2010-10-25 NOTE — Patient Instructions (Signed)
Your physician has requested that you have a stress echocardiogram. For further information please visit [email or web address]. Please follow instruction sheet as given.   Your physician has requested that you have an echocardiogram. Echocardiography is a painless test that uses sound waves to create images of your heart. It provides your doctor with information about the size and shape of your heart and how well your heart's chambers and valves are working. This procedure takes approximately one hour. There are no restrictions for this procedure.   

## 2010-10-26 LAB — BRAIN NATRIURETIC PEPTIDE: Brain Natriuretic Peptide: 17.8 pg/mL (ref 0.0–100.0)

## 2010-11-01 ENCOUNTER — Encounter: Payer: Self-pay | Admitting: *Deleted

## 2010-11-06 ENCOUNTER — Other Ambulatory Visit (HOSPITAL_COMMUNITY): Payer: Medicare Other | Admitting: Radiology

## 2010-11-06 ENCOUNTER — Encounter (HOSPITAL_COMMUNITY): Payer: Medicare Other | Admitting: Radiology

## 2010-11-17 ENCOUNTER — Encounter: Payer: Self-pay | Admitting: Family Medicine

## 2010-11-17 ENCOUNTER — Telehealth: Payer: Self-pay | Admitting: Family Medicine

## 2010-11-17 ENCOUNTER — Ambulatory Visit: Payer: Medicare Other | Admitting: Family Medicine

## 2010-11-17 ENCOUNTER — Inpatient Hospital Stay (INDEPENDENT_AMBULATORY_CARE_PROVIDER_SITE_OTHER)
Admit: 2010-11-17 | Discharge: 2010-11-17 | Disposition: A | Discharge status: Home / Self Care | Payer: Medicare Other | Attending: Family Medicine | Admitting: Family Medicine

## 2010-11-17 DIAGNOSIS — Z22330 Carrier of Group B streptococcus: Secondary | ICD-10-CM | POA: Insufficient documentation

## 2010-11-17 NOTE — Telephone Encounter (Signed)
Pt called and said all her grandchildren and their parents have been diagnosed with strep and she had been in contact with them.  The pediatrician has encouraged everyone that had come in contact with the children to get tested for strep. Plan:  Pt scheduled for a nurse visit today. Jarvis Newcomer, LPN [facility]

## 2010-11-19 ENCOUNTER — Telehealth (INDEPENDENT_AMBULATORY_CARE_PROVIDER_SITE_OTHER): Payer: Self-pay

## 2010-11-23 ENCOUNTER — Encounter (HOSPITAL_COMMUNITY): Payer: Medicare Other | Admitting: Radiology

## 2010-11-23 ENCOUNTER — Other Ambulatory Visit (HOSPITAL_COMMUNITY): Payer: Medicare Other | Admitting: Radiology

## 2010-12-07 ENCOUNTER — Other Ambulatory Visit (HOSPITAL_COMMUNITY): Payer: Medicare Other | Admitting: Radiology

## 2010-12-07 ENCOUNTER — Telehealth: Payer: Self-pay | Admitting: Family Medicine

## 2010-12-11 NOTE — Telephone Encounter (Signed)
Closed

## 2010-12-15 ENCOUNTER — Other Ambulatory Visit: Payer: Self-pay | Admitting: *Deleted

## 2010-12-15 MED ORDER — METRONIDAZOLE 1 % EX GEL: Freq: Every day | CUTANEOUS | Status: DC

## 2011-01-01 ENCOUNTER — Other Ambulatory Visit (HOSPITAL_COMMUNITY): Payer: Self-pay | Admitting: Radiology

## 2011-01-01 DIAGNOSIS — R0600 Dyspnea, unspecified: Secondary | ICD-10-CM

## 2011-01-02 ENCOUNTER — Other Ambulatory Visit (HOSPITAL_COMMUNITY): Payer: Medicare Other | Admitting: Radiology

## 2011-01-02 ENCOUNTER — Ambulatory Visit (HOSPITAL_BASED_OUTPATIENT_CLINIC_OR_DEPARTMENT_OTHER): Payer: Medicare Other | Admitting: Radiology

## 2011-01-02 ENCOUNTER — Ambulatory Visit (HOSPITAL_COMMUNITY): Payer: Medicare Other | Attending: Cardiovascular Disease | Admitting: Radiology

## 2011-01-02 ENCOUNTER — Encounter: Payer: Self-pay | Admitting: Cardiology

## 2011-01-02 DIAGNOSIS — R079 Chest pain, unspecified: Secondary | ICD-10-CM | POA: Insufficient documentation

## 2011-01-02 DIAGNOSIS — Z8249 Family history of ischemic heart disease and other diseases of the circulatory system: Secondary | ICD-10-CM | POA: Insufficient documentation

## 2011-01-02 DIAGNOSIS — R0600 Dyspnea, unspecified: Secondary | ICD-10-CM

## 2011-01-02 DIAGNOSIS — R42 Dizziness and giddiness: Secondary | ICD-10-CM | POA: Insufficient documentation

## 2011-01-02 DIAGNOSIS — R0609 Other forms of dyspnea: Secondary | ICD-10-CM | POA: Insufficient documentation

## 2011-01-02 DIAGNOSIS — R072 Precordial pain: Secondary | ICD-10-CM

## 2011-01-02 DIAGNOSIS — E785 Hyperlipidemia, unspecified: Secondary | ICD-10-CM | POA: Insufficient documentation

## 2011-01-02 DIAGNOSIS — R0989 Other specified symptoms and signs involving the circulatory and respiratory systems: Secondary | ICD-10-CM | POA: Insufficient documentation

## 2011-01-02 DIAGNOSIS — R5383 Other fatigue: Secondary | ICD-10-CM | POA: Insufficient documentation

## 2011-01-02 DIAGNOSIS — R609 Edema, unspecified: Secondary | ICD-10-CM | POA: Insufficient documentation

## 2011-01-02 DIAGNOSIS — R5381 Other malaise: Secondary | ICD-10-CM | POA: Insufficient documentation

## 2011-01-05 ENCOUNTER — Telehealth: Payer: Self-pay | Admitting: Critical Care Medicine

## 2011-01-05 MED ORDER — AMOXICILLIN-POT CLAVULANATE 875-125 MG PO TABS: 1.0000 | ORAL_TABLET | Freq: Two times a day (BID) | ORAL | Status: AC

## 2011-01-05 NOTE — Telephone Encounter (Signed)
Pt is requesting Rx for augmentin.  [name]

## 2011-01-05 NOTE — Telephone Encounter (Signed)
Called and spoke with pt.  Informed her of RB's recs.  Rx sent to pharmacy.

## 2011-01-05 NOTE — Telephone Encounter (Signed)
Doubt acute sinusitis, she needs to also do typical rx for viral URI = rest, decongestants, nasal washes, etc.  Can call in Augmentin 875 bid x 7 days  OV with Dr Delford Field to insure clearance. Note if this is viral, it will last about 10-14 days despite the abx

## 2011-01-05 NOTE — Telephone Encounter (Signed)
Called and spoke with pt.  Pt states she believes she has a sinus infection.  Symptoms started on Tuesday.  C/o sore throat, very hoarse voice, temp of 99.0, coughing up green and yellow sputum, body aches, "drained/tired" and sneezing.  Denies wheezing or sob.  States she is taking Mucinex 1200mg  bid with no relief of symptoms.  Pt requesting abx.  PW not in office today.  Will forward message to doc of the day to address.  Please advise. Thanks. Allergies  Allergen Reactions  . Morphine     REACTION: Nausea  . Moxifloxacin     REACTION: confusion, dizziness, paranoia

## 2011-01-15 NOTE — Telephone Encounter (Signed)
  Phone Note Outgoing Call   Call placed by: Linton Flemings RN,  November 19, 2010 1:12 PM Call placed to: Patient Summary of Call: f/u call to pt. states she has some congestion and just resting, informed of lab results

## 2011-01-15 NOTE — Progress Notes (Signed)
Summary: Expose to Strep rm 2   Vital Signs:  Patient Profile:   65 Years Old Female CC:      strep exposure  Height:     63 inches (160.02 cm) Weight:      174.50 pounds O2 Sat:      97 % O2 treatment:    Room Air Temp:     99.3 degrees F oral Pulse rate:   79 / minute Resp:     18 per minute BP sitting:   132 / 83  (left arm) Cuff size:   regular  Vitals Entered By: Clemens Catholic LPN (November 17, 2010 5:12 PM)                  Prior Medication List:  METROGEL 1 % GEL (METRONIDAZOLE) as directed PULMICORT FLEXHALER 180 MCG/ACT INHA (BUDESONIDE (INHALATION)) 2 puffs  twice daily WELLBUTRIN XL 150 MG  TB24 (BUPROPION HCL) Take 1 tablet by mouth once a day KLONOPIN 0.5 MG  TABS (CLONAZEPAM) 1/4 tab at bedtime only PAXIL CR 12.5 MG  TB24 (PAROXETINE HCL) Take 1 tablet by mouth two times a day FLONASE 50 MCG/ACT  SUSP (FLUTICASONE PROPIONATE) two puff each nostril daily GLUCOSAMINE-CHONDROITIN 1500-1200 MG/30ML  LIQD (GLUCOSAMINE-CHONDROITIN) one tab once daily ZEGERID 40-1100 MG  CAPS (OMEPRAZOLE-SODIUM BICARBONATE) once daily FOLIC ACID 1 MG  TABS (FOLIC ACID) 1 TAB by mouth two times a day PROAIR HFA 108 (90 BASE) MCG/ACT  AERS (ALBUTEROL SULFATE) 1-2 puffs every 4-6 hours as needed MUCINEX MAXIMUM STRENGTH 1200 MG XR12H-TAB (GUAIFENESIN) Take 1 tablet by mouth two times a day VITAMIN D3 5000 UNIT CAPS (CHOLECALCIFEROL) Take 1 capsule by mouth once a day SYNTHROID 88 MCG TABS (LEVOTHYROXINE SODIUM) take only on Sunday SYNTHROID 75 MCG TABS (LEVOTHYROXINE SODIUM) 1 by mouth daily except for Sunday * VEMMA MV DRINK daily CALCIUM CARBONATE-VITAMIN D 600-400 MG-UNIT  TABS (CALCIUM CARBONATE-VITAMIN D) three times a day GINKGO BILOBA   EXTR (GINKGO BILOBA) Take 1 tablet by mouth three times a day VITAMIN C 500 MG  TABS (ASCORBIC ACID) Take 1 tablet by mouth two times a day' BIOTIN 5000 5 MG CAPS (BIOTIN) Take 1 tablet by mouth once a day MENOSTAR 14 MCG/24HR PTWK (ESTRADIOL)  one patch weekly ACETAMINOPHEN-CODEINE #3 300-30 MG TABS (ACETAMINOPHEN-CODEINE) 1-2 tabs by mouth three times a day with food as needed for severe pain SIMPLY SALINE 0.9 % AERS (SALINE) as directed   Updated Prior Medication List: PULMICORT FLEXHALER 180 MCG/ACT INHA (BUDESONIDE (INHALATION)) 2 puffs  twice daily WELLBUTRIN XL 150 MG  TB24 (BUPROPION HCL) Take 1 tablet by mouth once a day PAXIL CR 12.5 MG  TB24 (PAROXETINE HCL) Take 1 tablet by mouth two times a day GLUCOSAMINE-CHONDROITIN 1500-1200 MG/30ML  LIQD (GLUCOSAMINE-CHONDROITIN) one tab once daily ZEGERID 40-1100 MG  CAPS (OMEPRAZOLE-SODIUM BICARBONATE) once daily FOLIC ACID 1 MG  TABS (FOLIC ACID) 1 TAB by mouth two times a day PROAIR HFA 108 (90 BASE) MCG/ACT  AERS (ALBUTEROL SULFATE) 1-2 puffs every 4-6 hours as needed VITAMIN D3 5000 UNIT CAPS (CHOLECALCIFEROL) Take 1 capsule by mouth once a day SYNTHROID 88 MCG TABS (LEVOTHYROXINE SODIUM) take only on Sunday SYNTHROID 75 MCG TABS (LEVOTHYROXINE SODIUM) 1 by mouth daily except for Sunday CALCIUM CARBONATE-VITAMIN D 600-400 MG-UNIT  TABS (CALCIUM CARBONATE-VITAMIN D) three times a day GINKGO BILOBA   EXTR (GINKGO BILOBA) Take 1 tablet by mouth three times a day VITAMIN C 500 MG  TABS (ASCORBIC ACID) Take 1 tablet by  mouth two times a day' BIOTIN 5000 5 MG CAPS (BIOTIN) Take 1 tablet by mouth once a day SIMPLY SALINE 0.9 % AERS (SALINE) as directed ALLEGRA ALLERGY 180 MG TABS (FEXOFENADINE HCL)   Current Allergies (reviewed today): ! MORPHINE ! * AVELOXHistory of Present Illness Chief Complaint: strep exposure  History of Present Illness: Patient grandchildren have had repeated strep throatsthis past despite surgeries so the question isif there is astrep carrier in the household/family. At this time she is having no symptoms.  Current Problems: CARRIER/SUSPECTED CARRIER GROUP B STREPTOCOCCUS (ICD-V02.51) SOMNOLENCE (ICD-780.09) EUSTACHIAN TUBE DYSFUNCTION, LEFT  (ICD-381.81) ACUTE MAXILLARY SINUSITIS (ICD-461.0) POSTMENOPAUSAL STATUS (ICD-V49.81) OSTEOARTHRITIS, GENERALIZED, MULTIPLE JOINTS (ICD-715.09) PRECORDIAL PAIN (ICD-786.51) HYPERLIPIDEMIA (ICD-272.4) ARTHRITIS, HANDS, BILATERAL (ICD-716.94) PEPTIC ULCER DISEASE (ICD-533.90) ALLERGIC RHINITIS (ICD-477.9) ASTHMA (ICD-493.90) VERTIGO, BENIGN PAROXYSMAL POSITION (ICD-386.11) DEPRESSION (ICD-311) GERD (ICD-530.81) HYPOTHYROIDISM (ICD-244.9) LOC OSTEOARTHROS NOT SPEC PRIM/SEC LOWER LEG (ICD-715.36) VOCAL CORD DISORDER (ICD-478.5)   Current Meds PULMICORT FLEXHALER 180 MCG/ACT INHA (BUDESONIDE (INHALATION)) 2 puffs  twice daily WELLBUTRIN XL 150 MG  TB24 (BUPROPION HCL) Take 1 tablet by mouth once a day PAXIL CR 12.5 MG  TB24 (PAROXETINE HCL) Take 1 tablet by mouth two times a day GLUCOSAMINE-CHONDROITIN 1500-1200 MG/30ML  LIQD (GLUCOSAMINE-CHONDROITIN) one tab once daily ZEGERID 40-1100 MG  CAPS (OMEPRAZOLE-SODIUM BICARBONATE) once daily FOLIC ACID 1 MG  TABS (FOLIC ACID) 1 TAB by mouth two times a day PROAIR HFA 108 (90 BASE) MCG/ACT  AERS (ALBUTEROL SULFATE) 1-2 puffs every 4-6 hours as needed VITAMIN D3 5000 UNIT CAPS (CHOLECALCIFEROL) Take 1 capsule by mouth once a day SYNTHROID 88 MCG TABS (LEVOTHYROXINE SODIUM) take only on Sunday SYNTHROID 75 MCG TABS (LEVOTHYROXINE SODIUM) 1 by mouth daily except for Sunday CALCIUM CARBONATE-VITAMIN D 600-400 MG-UNIT  TABS (CALCIUM CARBONATE-VITAMIN D) three times a day GINKGO BILOBA   EXTR (GINKGO BILOBA) Take 1 tablet by mouth three times a day VITAMIN C 500 MG  TABS (ASCORBIC ACID) Take 1 tablet by mouth two times a day' BIOTIN 5000 5 MG CAPS (BIOTIN) Take 1 tablet by mouth once a day SIMPLY SALINE 0.9 % AERS (SALINE) as directed ALLEGRA ALLERGY 180 MG TABS (FEXOFENADINE HCL)   REVIEW OF SYSTEMS Constitutional Symptoms      Denies fever, chills, night sweats, weight loss, weight gain, and fatigue.  Eyes       Denies change in vision, eye  pain, eye discharge, glasses, contact lenses, and eye surgery. Ear/Nose/Throat/Mouth       Denies hearing loss/aids, change in hearing, ear pain, ear discharge, dizziness, frequent runny nose, frequent nose bleeds, sinus problems, sore throat, hoarseness, and tooth pain or bleeding.  Respiratory       Denies dry cough, productive cough, wheezing, shortness of breath, asthma, bronchitis, and emphysema/COPD.  Cardiovascular       Denies murmurs, chest pain, and tires easily with exhertion.    Gastrointestinal       Denies stomach pain, nausea/vomiting, diarrhea, constipation, blood in bowel movements, and indigestion. Genitourniary       Denies painful urination, kidney stones, and loss of urinary control. Neurological       Denies paralysis, seizures, and fainting/blackouts. Musculoskeletal       Denies muscle pain, joint pain, joint stiffness, decreased range of motion, redness, swelling, muscle weakness, and gout.  Skin       Denies bruising, unusual mles/lumps or sores, and hair/skin or nail changes.  Psych       Denies mood changes, temper/anger issues, anxiety/stress, speech problems, depression, and  sleep problems. Other Comments: The pt is here today bc of her recent strep exposure. She is concerned that she may be a strep carrier. she has had sore throats on and off for years.   Past History:  Family History: Last updated: Jul 22, 2008 father died of myocardial infarction (first MI at age 12) mother had colon cancer in her 41s, aortic stenosis Family History of Colon CA 1st degree relative <60 brother died of asthma at 66. 1 brother died of liver cancer, ETOH 3 older brothers living  Social History: Last updated: 06/22/2008 Married.  Has 3 kids.  Oldest daughter in [place].  Son in [place] with 3 kids.  youngest daughter lives with her.  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 Works out once a wk  Past Medical History: Reviewed  history from 11/30/2008 and no changes required. Current Problems:  HYPERLIPIDEMIA (ICD-272.4) ARTHRITIS, HANDS, BILATERAL (ICD-716.94) ASTHMATIC BRONCHITIS, ACUTE (ICD-466.0) PEPTIC ULCER DISEASE (ICD-533.90) ALLERGIC RHINITIS (ICD-477.9) ASTHMA (ICD-493.90) VERTIGO, BENIGN PAROXYSMAL POSITION (ICD-386.11) DEPRESSION (ICD-311) GERD (ICD-530.81) HYPOTHYROIDISM (ICD-244.9) LOC OSTEOARTHROS NOT SPEC PRIM/SEC LOWER LEG (ICD-715.36) VOCAL CORD DISORDER (ICD-478.5) Nephrolithiasis  Dr Primitivo Gauze -- gyn  Past Surgical History: Reviewed history from 22-Jul-2008 and no changes required. torn meniscus in knees  ( athroscopy) sinus surgery colonoscopy rotator cuff R shoulder 07/10/07  Panendoscopy. Benign tumor removed from thumb  Family History: Reviewed history from 07-22-08 and no changes required. father died of myocardial infarction (first MI at age 33) mother had colon cancer in her 80s, aortic stenosis Family History of Colon CA 1st degree relative <60 brother died of asthma at 85. 1 brother died of liver cancer, ETOH 3 older brothers living  Social History: Reviewed history from 06/22/2008 and no changes required. Married.  Has 3 kids.  Oldest daughter in [place].  Son in [place] with 3 kids.  youngest daughter lives with her.  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 Works out once a wk Physical Exam General appearance: well developed, well nourished, no acute distress Head: normocephalic, atraumatic Ears: normal, no lesions or deformities Nasal: mucosa pink, nonedematous, no septal deviation, turbinates normal Oral/Pharynx: tongue normal, posterior pharynx without erythema or exudate Neck: neck supple,  trachea midline, no masses Skin: no obvious rashes or lesions MSE: oriented to time, place, and person Assessment Problems:   SOMNOLENCE (ICD-780.09) EUSTACHIAN TUBE DYSFUNCTION, LEFT (ICD-381.81) ACUTE MAXILLARY SINUSITIS  (ICD-461.0) POSTMENOPAUSAL STATUS (ICD-V49.81) OSTEOARTHRITIS, GENERALIZED, MULTIPLE JOINTS (ICD-715.09) PRECORDIAL PAIN (ICD-786.51) HYPERLIPIDEMIA (ICD-272.4) ARTHRITIS, HANDS, BILATERAL (ICD-716.94) PEPTIC ULCER DISEASE (ICD-533.90) ALLERGIC RHINITIS (ICD-477.9) ASTHMA (ICD-493.90) VERTIGO, BENIGN PAROXYSMAL POSITION (ICD-386.11) DEPRESSION (ICD-311) GERD (ICD-530.81) HYPOTHYROIDISM (ICD-244.9) LOC OSTEOARTHROS NOT SPEC PRIM/SEC LOWER LEG (ICD-715.36) VOCAL CORD DISORDER (ICD-478.5) New Problems: CARRIER/SUSPECTED CARRIER GROUP B STREPTOCOCCUS (ICD-V02.51)   Plan New Orders: Est. Patient Level III [16109] Rapid Strep [60454] T-Culture, Rapid Strep [[id number]] Follow Up: Follow up on an as needed basis, Follow up with Primary Physician  The patient and/or caregiver has been counseled thoroughly with regard to medications prescribed including dosage, schedule, interactions, rationale for use, and possible side effects and they verbalize understanding.  Diagnoses and expected course of recovery discussed and will return if not improved as expected or if the condition worsens. Patient and/or caregiver verbalized understanding.   Patient Instructions: 1)  Please schedule a follow-up appointment as needed. 2)  Please schedule an appointment with your primary doctor in :if needed  Orders Added: 1)  Est. Patient Level III [  99213] 2)  Rapid Strep [87880] 3)  T-Culture, Rapid Strep [[id number]]    Laboratory Results  Date/Time Received: November 17, 2010 5:37 PM  Date/Time Reported: November 17, 2010 5:38 PM   Other Tests  Rapid Strep: negative  Kit Test Internal QC: Negative   (Normal Range: Negative)

## 2011-01-17 ENCOUNTER — Ambulatory Visit (INDEPENDENT_AMBULATORY_CARE_PROVIDER_SITE_OTHER): Payer: Medicare Other | Admitting: Critical Care Medicine

## 2011-01-17 ENCOUNTER — Encounter: Payer: Self-pay | Admitting: Critical Care Medicine

## 2011-01-17 DIAGNOSIS — J209 Acute bronchitis, unspecified: Secondary | ICD-10-CM

## 2011-01-17 DIAGNOSIS — J019 Acute sinusitis, unspecified: Secondary | ICD-10-CM

## 2011-01-17 DIAGNOSIS — J0100 Acute maxillary sinusitis, unspecified: Secondary | ICD-10-CM

## 2011-01-17 MED ORDER — PREDNISONE 10 MG PO TABS: ORAL_TABLET | ORAL | Status: DC

## 2011-01-17 MED ORDER — BUDESONIDE 180 MCG/ACT IN AEPB: 2.0000 | INHALATION_SPRAY | Freq: Two times a day (BID) | RESPIRATORY_TRACT | Status: DC

## 2011-01-17 MED ORDER — FISH OIL 1000 MG PO CAPS: ORAL_CAPSULE | ORAL | Status: DC

## 2011-01-17 MED ORDER — FEXOFENADINE HCL 180 MG PO TABS: ORAL_TABLET | ORAL | Status: DC

## 2011-01-17 MED ORDER — AMOXICILLIN-POT CLAVULANATE 875-125 MG PO TABS: 1.0000 | ORAL_TABLET | Freq: Two times a day (BID) | ORAL | Status: AC

## 2011-01-17 NOTE — Progress Notes (Signed)
Subjective:    Patient ID: [name], female    DOB: 05-04-1945, 65 y.o.   MRN: [id number]  HPI  F/U of   65 y.o.    white female with chronic allergic rhinitis and extrinsic asthma. The patient also has associated vocal cord dysfunction syndrome and reflux disease.    01/18/2011 Ill over thanksgiving.  Rx augmentin.  Had throat irritation, cough.  Seemed better, but now during day, worse, more hoarseness,  Still coughing.  Fatigued.  Notes some tightness in the throat area. OUt of nose is yellow to white.  Was worse before the augmentin.  Was bloody green. Occ mucus out of the lungs, occ loose.  White but not clear out of lungs. No heartburn Past Medical History  Diagnosis Date  . Other and unspecified hyperlipidemia   . Unspecified arthropathy, hand   . Peptic ulcer, unspecified site, unspecified as acute or chronic, without mention of hemorrhage, perforation, or obstruction   . Allergic rhinitis, cause unspecified   . Unspecified asthma   . Benign paroxysmal positional vertigo   . Depressive disorder, not elsewhere classified   . Esophageal reflux   . Unspecified hypothyroidism   . Localized osteoarthrosis not specified whether primary or secondary, lower leg   . Other diseases of vocal cords      Family History  Problem Relation Age of Onset  . Heart disease Father   . Colon cancer Mother   . Liver cancer Brother   . Asthma Brother      History   Social History  . Marital Status: Married    Spouse Name: N/A    Number of Children: 3  . Years of Education: N/A   Occupational History  . Retired    Social History Main Topics  . Smoking status: Former Smoker -- 0.3 packs/day for 2 years    Types: Cigarettes    Quit date: 02/12/1958  . Smokeless tobacco: Never Used  . Alcohol Use: Yes     wine socially  . Drug Use: No  . Sexually Active: Not on file   Other Topics Concern  . Not on file   Social History Narrative  . No narrative on file      Allergies  Allergen Reactions  . Morphine     REACTION: Nausea  . Moxifloxacin     REACTION: confusion, dizziness, paranoia     Outpatient Prescriptions Prior to Visit  Medication Sig Dispense Refill  . albuterol (PROVENTIL HFA) 108 (90 BASE) MCG/ACT inhaler Inhale 2 puffs into the lungs every 6 (six) hours as needed.        . Ascorbic Acid (VITAMIN C) 500 MG tablet Take 500 mg by mouth 2 (two) times daily.        . ASTRAGALUS PO Take 1 capsule by mouth daily.        . Biotin (BIOTIN 5000) 5 MG CAPS Take 1 capsule by mouth daily.        Marland Kitchen buPROPion (WELLBUTRIN XL) 150 MG 24 hr tablet Take 150 mg by mouth daily.        . Calcium Carbonate-Vit D-Min 600-400 MG-UNIT TABS 1 tablet by mouth 2-3 times daily      . Cholecalciferol (VITAMIN D3) 2000 UNITS TABS Take 1 tablet by mouth daily.        . clonazePAM (KLONOPIN) 0.5 MG tablet 1/4 by mouth daily as needed      . estradiol (MENOSTAR) 14 MCG/24HR Place 1 patch onto the skin once  a week. Apply the patch to a clean, dry, non-oily skin area of your lower abdomen, hips below the waist, or buttocks that has little or no hair and is free of cuts or irritation.      . fluticasone (FLONASE) 50 MCG/ACT nasal spray 2 sprays by Nasal route daily.        . folic acid (FOLVITE) 1 MG tablet Take 1 mg by mouth 2 (two) times daily.        . Ginkgo Biloba 40 MG TABS Take 1 tablet by mouth 3 (three) times daily. As needed      . Glucosamine HCl 1500 MG TABS Take 1 tablet by mouth daily.        . Guaifenesin (MUCINEX MAXIMUM STRENGTH) 1200 MG TB12 Take 1 tablet by mouth 2 (two) times daily. As needed      . levothyroxine (SYNTHROID, LEVOTHROID) 75 MCG tablet Take 75 mcg by mouth daily. Except Sunday        . levothyroxine (SYNTHROID, LEVOTHROID) 88 MCG tablet Take only sunday       . LYSINE PO Once daily       . metroNIDAZOLE (METROGEL) 1 % gel Apply topically daily.  45 g  1  . Multiple Vitamin (MULTIVITAMIN) tablet Take 1 tablet by mouth daily.         Marland Kitchen omeprazole-sodium bicarbonate (ZEGERID) 40-1100 MG per capsule Take 1 capsule by mouth daily before breakfast.        . PARoxetine (PAXIL-CR) 12.5 MG 24 hr tablet Take 12.5 mg by mouth 2 (two) times daily.        . sodium chloride (BRONCHO SALINE) inhaler solution Saline wash twice daily      . budesonide (PULMICORT FLEXHALER) 180 MCG/ACT inhaler Inhale 2 puffs into the lungs daily.       . fexofenadine (ALLEGRA) 180 MG tablet Take 180 mg by mouth at bedtime.        . Omega-3 Fatty Acids (FISH OIL) 1000 MG CAPS Hold for 10days then resume      . AMBULATORY NON FORMULARY MEDICATION Medication Name: Ketoprofen 2% : Diclofenac 2%gel  Apply 0.5 cc to affected joints 2-3 x a day as needed for pain  1 Bottle  1  . AMBULATORY NON FORMULARY MEDICATION Medication Name: Zostavax. IM x 1  1 vial  0     Review of Systems Constitutional:   No  weight loss, night sweats,  Fevers, chills, fatigue, lassitude. HEENT:   No headaches,  Difficulty swallowing,  Tooth/dental problems, +++ Sore throat,                No sneezing, itching, ear ache, +++ nasal congestion,+++ post nasal drip,   CV:  No chest pain,  Orthopnea, PND, swelling in lower extremities, anasarca, dizziness, palpitations  GI  No heartburn, indigestion, abdominal pain, nausea, vomiting, diarrhea, change in bowel habits, loss of appetite  Resp: Notes  shortness of breath with exertion or at rest.  No excess mucus,+++ productive cough,  +++ non-productive cough,  No coughing up of blood.  No change in color of mucus.  +++ wheezing.  No chest wall deformity  Skin: no rash or lesions.  GU: no dysuria, change in color of urine, no urgency or frequency.  No flank pain.  MS:  No joint pain or swelling.  No decreased range of motion.  No back pain.  Psych:  No change in mood or affect. No depression or anxiety.  No memory loss.  Objective:   Physical Exam  Gen: Pleasant, well-nourished, in no distress,  normal affect  ENT: R > L  nares purulence and post nasal drip  Neck: No JVD, no TMG, no carotid bruits  Lungs: No use of accessory muscles, no dullness to percussion, no wheeze except mild pseudowheeze.  Cardiovascular: RRR, heart sounds normal, no murmur or gallops, no peripheral edema  Abdomen: soft and NT, no HSM,  BS normal  Musculoskeletal: No deformities, no cyanosis or clubbing  Neuro: alert, non focal  Skin: Warm, no lesions or rashes    No images are attached to the encounter.        Assessment & Plan:   Acute maxillary sinusitis Acute on chronic sinusitis with flare, associated flare of vocal cord dysfunction, cyclic cough, lower airway inflammation Plan Hold Allegra for 10days then resume Increase pulmicort to two puff twice daily HOLD fish oil Augmentin one twice daily for 10days Prednisone 10mg  Take 4 for two days three for two days two for two days one for two days Return 10days for recheck with Tammy Parrett       Updated Medication List Outpatient Encounter Prescriptions as of 01/17/2011  Medication Sig Dispense Refill  . albuterol (PROVENTIL HFA) 108 (90 BASE) MCG/ACT inhaler Inhale 2 puffs into the lungs every 6 (six) hours as needed.        . Ascorbic Acid (VITAMIN C) 500 MG tablet Take 500 mg by mouth 2 (two) times daily.        . ASTRAGALUS PO Take 1 capsule by mouth daily.        . Biotin (BIOTIN 5000) 5 MG CAPS Take 1 capsule by mouth daily.        . budesonide (PULMICORT FLEXHALER) 180 MCG/ACT inhaler Inhale 2 puffs into the lungs 2 (two) times daily.      Marland Kitchen buPROPion (WELLBUTRIN XL) 150 MG 24 hr tablet Take 150 mg by mouth daily.        . Calcium Carbonate-Vit D-Min 600-400 MG-UNIT TABS 1 tablet by mouth 2-3 times daily      . Cholecalciferol (VITAMIN D3) 2000 UNITS TABS Take 1 tablet by mouth daily.        . clonazePAM (KLONOPIN) 0.5 MG tablet 1/4 by mouth daily as needed      . estradiol (MENOSTAR) 14 MCG/24HR Place 1 patch onto the skin once a week. Apply the  patch to a clean, dry, non-oily skin area of your lower abdomen, hips below the waist, or buttocks that has little or no hair and is free of cuts or irritation.      . fexofenadine (ALLEGRA) 180 MG tablet HOLD for 10days then resume      . fluticasone (FLONASE) 50 MCG/ACT nasal spray 2 sprays by Nasal route daily.        . folic acid (FOLVITE) 1 MG tablet Take 1 mg by mouth 2 (two) times daily.        . Ginkgo Biloba 40 MG TABS Take 1 tablet by mouth 3 (three) times daily. As needed      . Glucosamine HCl 1500 MG TABS Take 1 tablet by mouth daily.        . Guaifenesin (MUCINEX MAXIMUM STRENGTH) 1200 MG TB12 Take 1 tablet by mouth 2 (two) times daily. As needed      . levothyroxine (SYNTHROID, LEVOTHROID) 75 MCG tablet Take 75 mcg by mouth daily. Except Sunday        . levothyroxine (SYNTHROID, LEVOTHROID) 88 MCG  tablet Take only sunday       . LYSINE PO Once daily       . metroNIDAZOLE (METROGEL) 1 % gel Apply topically daily.  45 g  1  . Multiple Vitamin (MULTIVITAMIN) tablet Take 1 tablet by mouth daily.        . Omega-3 Fatty Acids (FISH OIL) 1000 MG CAPS HOLD FOR TWO WEEKS then resume 1 capsule daily      . omeprazole-sodium bicarbonate (ZEGERID) 40-1100 MG per capsule Take 1 capsule by mouth daily before breakfast.        . PARoxetine (PAXIL-CR) 12.5 MG 24 hr tablet Take 12.5 mg by mouth 2 (two) times daily.        . sodium chloride (BRONCHO SALINE) inhaler solution Saline wash twice daily      . DISCONTD: budesonide (PULMICORT FLEXHALER) 180 MCG/ACT inhaler Inhale 2 puffs into the lungs daily.       Marland Kitchen DISCONTD: fexofenadine (ALLEGRA) 180 MG tablet Take 180 mg by mouth at bedtime.        Marland Kitchen DISCONTD: Omega-3 Fatty Acids (FISH OIL) 1000 MG CAPS Hold for 10days then resume      . DISCONTD: Omega-3 Fatty Acids (FISH OIL) 1000 MG CAPS 1 capsule daily       . amoxicillin-clavulanate (AUGMENTIN) 875-125 MG per tablet Take 1 tablet by mouth 2 (two) times daily.  20 tablet  0  . predniSONE  (DELTASONE) 10 MG tablet Take 4 for two days three for two days two for two days one for two days  20 tablet  0  . DISCONTD: AMBULATORY NON FORMULARY MEDICATION Medication Name: Ketoprofen 2% : Diclofenac 2%gel  Apply 0.5 cc to affected joints 2-3 x a day as needed for pain  1 Bottle  1  . DISCONTD: AMBULATORY NON FORMULARY MEDICATION Medication Name: Zostavax. IM x 1  1 vial  0

## 2011-01-17 NOTE — Patient Instructions (Signed)
Hold Allegra for 10days then resume Increase pulmicort to two puff twice daily HOLD fish oil Augmentin one twice daily for 10days Prednisone 10mg  Take 4 for two days three for two days two for two days one for two days Return 10days for recheck with Tammy Parrett

## 2011-01-18 NOTE — Assessment & Plan Note (Signed)
Acute on chronic sinusitis with flare, associated flare of vocal cord dysfunction, cyclic cough, lower airway inflammation Plan Hold Allegra for 10days then resume Increase pulmicort to two puff twice daily HOLD fish oil Augmentin one twice daily for 10days Prednisone 10mg  Take 4 for two days three for two days two for two days one for two days Return 10days for recheck with Tammy Parrett

## 2011-01-29 ENCOUNTER — Encounter: Payer: Self-pay | Admitting: Adult Health

## 2011-01-29 ENCOUNTER — Ambulatory Visit (INDEPENDENT_AMBULATORY_CARE_PROVIDER_SITE_OTHER): Payer: Medicare Other | Admitting: Adult Health

## 2011-01-29 DIAGNOSIS — J0100 Acute maxillary sinusitis, unspecified: Secondary | ICD-10-CM

## 2011-01-29 DIAGNOSIS — J45909 Unspecified asthma, uncomplicated: Secondary | ICD-10-CM

## 2011-01-29 NOTE — Assessment & Plan Note (Signed)
Resolved with abx  Cont on current regimen.

## 2011-01-29 NOTE — Patient Instructions (Addendum)
Continue on current regimen.  Follow up Dr. Delford Field  In 6 weeks and As needed

## 2011-01-29 NOTE — Assessment & Plan Note (Signed)
Recent flare with sinusitis- now resolved with abx and steroids

## 2011-01-29 NOTE — Progress Notes (Signed)
Subjective:    Patient ID: [name], female    DOB: October 17, 1945, 65 y.o.   MRN: [id number]  HPI   F/U of   65 y.o.    white female with chronic allergic rhinitis and extrinsic asthma. The patient also has associated vocal cord dysfunction syndrome and reflux disease.    01/17/2011 Ill over thanksgiving.  Rx augmentin.  Had throat irritation, cough.  Seemed better, but now during day, worse, more hoarseness,  Still coughing.  Fatigued.  Notes some tightness in the throat area. OUt of nose is yellow to white.  Was worse before the augmentin.  Was bloody green. Occ mucus out of the lungs, occ loose.  White but not clear out of lungs. No heartburn>>Rx Augmentin x 10 days and steroid taper   01/29/2011 Follow up  Pt returns for follow up. She was seen for asthmatic flare with sinusitis last ov. Tx w/ augmentin and steroid taper. She is feeling much better with decreased cough/congestion .  No wheezing or fever.  Has finished abx and steroids .     Past Medical History  Diagnosis Date  . Other and unspecified hyperlipidemia   . Unspecified arthropathy, hand   . Peptic ulcer, unspecified site, unspecified as acute or chronic, without mention of hemorrhage, perforation, or obstruction   . Allergic rhinitis, cause unspecified   . Unspecified asthma   . Benign paroxysmal positional vertigo   . Depressive disorder, not elsewhere classified   . Esophageal reflux   . Unspecified hypothyroidism   . Localized osteoarthrosis not specified whether primary or secondary, lower leg   . Other diseases of vocal cords      Family History  Problem Relation Age of Onset  . Heart disease Father   . Colon cancer Mother   . Liver cancer Brother   . Asthma Brother      History   Social History  . Marital Status: Married    Spouse Name: N/A    Number of Children: 3  . Years of Education: N/A   Occupational History  . Retired    Social History Main Topics  . Smoking status: Former  Smoker -- 0.3 packs/day for 2 years    Types: Cigarettes    Quit date: 02/12/1958  . Smokeless tobacco: Never Used  . Alcohol Use: Yes     wine socially  . Drug Use: No  . Sexually Active: Not on file   Other Topics Concern  . Not on file   Social History Narrative  . No narrative on file     Allergies  Allergen Reactions  . Morphine     REACTION: Nausea  . Moxifloxacin     REACTION: confusion, dizziness, paranoia     Outpatient Prescriptions Prior to Visit  Medication Sig Dispense Refill  . albuterol (PROVENTIL HFA) 108 (90 BASE) MCG/ACT inhaler Inhale 2 puffs into the lungs every 6 (six) hours as needed.        . Ascorbic Acid (VITAMIN C) 500 MG tablet Take 500 mg by mouth 2 (two) times daily.        . ASTRAGALUS PO Take 1 capsule by mouth daily.        . Biotin (BIOTIN 5000) 5 MG CAPS Take 1 capsule by mouth daily.        Marland Kitchen buPROPion (WELLBUTRIN XL) 150 MG 24 hr tablet Take 150 mg by mouth daily.        . Calcium Carbonate-Vit D-Min 600-400 MG-UNIT TABS 1  tablet by mouth 2-3 times daily      . Cholecalciferol (VITAMIN D3) 2000 UNITS TABS Take 1 tablet by mouth daily.        . clonazePAM (KLONOPIN) 0.5 MG tablet 1/4 by mouth daily as needed      . estradiol (MENOSTAR) 14 MCG/24HR Place 1 patch onto the skin once a week. Apply the patch to a clean, dry, non-oily skin area of your lower abdomen, hips below the waist, or buttocks that has little or no hair and is free of cuts or irritation.      . fluticasone (FLONASE) 50 MCG/ACT nasal spray 2 sprays by Nasal route daily.        . folic acid (FOLVITE) 1 MG tablet Take 1 mg by mouth 2 (two) times daily.        . Ginkgo Biloba 40 MG TABS Take 1 tablet by mouth 3 (three) times daily. As needed      . Glucosamine HCl 1500 MG TABS Take 1 tablet by mouth daily.        . Guaifenesin (MUCINEX MAXIMUM STRENGTH) 1200 MG TB12 Take 1 tablet by mouth 2 (two) times daily. As needed      . levothyroxine (SYNTHROID, LEVOTHROID) 75 MCG tablet  Take 75 mcg by mouth daily. Except Sunday        . levothyroxine (SYNTHROID, LEVOTHROID) 88 MCG tablet Take only sunday       . LYSINE PO Once daily       . metroNIDAZOLE (METROGEL) 1 % gel Apply topically daily.  45 g  1  . Multiple Vitamin (MULTIVITAMIN) tablet Take 1 tablet by mouth daily.        Marland Kitchen omeprazole-sodium bicarbonate (ZEGERID) 40-1100 MG per capsule Take 1 capsule by mouth daily before breakfast.        . PARoxetine (PAXIL-CR) 12.5 MG 24 hr tablet Take 12.5 mg by mouth 2 (two) times daily.        . sodium chloride (BRONCHO SALINE) inhaler solution Saline wash twice daily      . budesonide (PULMICORT FLEXHALER) 180 MCG/ACT inhaler Inhale 2 puffs into the lungs 2 (two) times daily.      . fexofenadine (ALLEGRA) 180 MG tablet HOLD for 10days then resume      . Omega-3 Fatty Acids (FISH OIL) 1000 MG CAPS HOLD FOR TWO WEEKS then resume 1 capsule daily      . predniSONE (DELTASONE) 10 MG tablet Take 4 for two days three for two days two for two days one for two days  20 tablet  0     Review of Systems  Constitutional:   No  weight loss, night sweats,  Fevers, chills, fatigue, lassitude. HEENT:   No headaches,  Difficulty swallowing,  Tooth/dental problems   CV:  No chest pain,  Orthopnea, PND, swelling in lower extremities, anasarca, dizziness, palpitations  GI  No heartburn, indigestion, abdominal pain, nausea, vomiting, diarrhea, change in bowel habits, loss of appetite  Resp:  .  No excess mucus   No coughing up of blood.  No change in color of mucus.    No chest wall deformity  Skin: no rash or lesions.  GU: no dysuria, change in color of urine, no urgency or frequency.  No flank pain.  MS:  No joint pain or swelling.  No decreased range of motion.  No back pain.  Psych:  No change in mood or affect. No depression or anxiety.  No memory loss.  Objective:   Physical Exam  Gen: Pleasant, well-nourished, in no distress,  normal affect  ENT: NM pale with clear  mucus, no  post nasal drip  Neck: No JVD, no TMG, no carotid bruits  Lungs: No use of accessory muscles, no dullness to percussion, no wheeze    Cardiovascular: RRR, heart sounds normal, no murmur or gallops, no peripheral edema  Abdomen: soft and NT, no HSM,  BS normal  Musculoskeletal: No deformities, no cyanosis or clubbing  Neuro: alert, non focal  Skin: Warm, no lesions or rashes             Assessment & Plan:   No problem-specific assessment & plan notes found for this encounter.   Updated Medication List Outpatient Encounter Prescriptions as of 01/29/2011  Medication Sig Dispense Refill  . albuterol (PROVENTIL HFA) 108 (90 BASE) MCG/ACT inhaler Inhale 2 puffs into the lungs every 6 (six) hours as needed.        . Ascorbic Acid (VITAMIN C) 500 MG tablet Take 500 mg by mouth 2 (two) times daily.        . ASTRAGALUS PO Take 1 capsule by mouth daily.        . Biotin (BIOTIN 5000) 5 MG CAPS Take 1 capsule by mouth daily.        . budesonide (PULMICORT) 180 MCG/ACT inhaler Inhale 2 puffs into the lungs daily.        Marland Kitchen buPROPion (WELLBUTRIN XL) 150 MG 24 hr tablet Take 150 mg by mouth daily.        . Calcium Carbonate-Vit D-Min 600-400 MG-UNIT TABS 1 tablet by mouth 2-3 times daily      . Cholecalciferol (VITAMIN D3) 2000 UNITS TABS Take 1 tablet by mouth daily.        . clonazePAM (KLONOPIN) 0.5 MG tablet 1/4 by mouth daily as needed      . estradiol (MENOSTAR) 14 MCG/24HR Place 1 patch onto the skin once a week. Apply the patch to a clean, dry, non-oily skin area of your lower abdomen, hips below the waist, or buttocks that has little or no hair and is free of cuts or irritation.      . fluticasone (FLONASE) 50 MCG/ACT nasal spray 2 sprays by Nasal route daily.        . folic acid (FOLVITE) 1 MG tablet Take 1 mg by mouth 2 (two) times daily.        . Ginkgo Biloba 40 MG TABS Take 1 tablet by mouth 3 (three) times daily. As needed      . Glucosamine HCl 1500 MG TABS Take  1 tablet by mouth daily.        . Guaifenesin (MUCINEX MAXIMUM STRENGTH) 1200 MG TB12 Take 1 tablet by mouth 2 (two) times daily. As needed      . levothyroxine (SYNTHROID, LEVOTHROID) 75 MCG tablet Take 75 mcg by mouth daily. Except Sunday        . levothyroxine (SYNTHROID, LEVOTHROID) 88 MCG tablet Take only sunday       . LYSINE PO Once daily       . metroNIDAZOLE (METROGEL) 1 % gel Apply topically daily.  45 g  1  . Multiple Vitamin (MULTIVITAMIN) tablet Take 1 tablet by mouth daily.        Marland Kitchen omeprazole-sodium bicarbonate (ZEGERID) 40-1100 MG per capsule Take 1 capsule by mouth daily before breakfast.        . PARoxetine (PAXIL-CR) 12.5 MG 24 hr tablet Take  12.5 mg by mouth 2 (two) times daily.        . sodium chloride (BRONCHO SALINE) inhaler solution Saline wash twice daily      . DISCONTD: budesonide (PULMICORT FLEXHALER) 180 MCG/ACT inhaler Inhale 2 puffs into the lungs 2 (two) times daily.      . fexofenadine (ALLEGRA) 180 MG tablet HOLD for 10days then resume      . Omega-3 Fatty Acids (FISH OIL) 1000 MG CAPS HOLD FOR TWO WEEKS then resume 1 capsule daily      . DISCONTD: predniSONE (DELTASONE) 10 MG tablet Take 4 for two days three for two days two for two days one for two days  20 tablet  0

## 2011-03-13 ENCOUNTER — Ambulatory Visit: Payer: Medicare Other | Admitting: Critical Care Medicine

## 2011-03-16 LAB — LIPID PANEL
Cholesterol: 204 mg/dL — AB (ref 0–200)
HDL: 51 mg/dL (ref 35–70)

## 2011-03-27 ENCOUNTER — Ambulatory Visit (INDEPENDENT_AMBULATORY_CARE_PROVIDER_SITE_OTHER): Payer: Medicare Other | Admitting: Physician Assistant

## 2011-03-27 ENCOUNTER — Encounter: Payer: Self-pay | Admitting: Physician Assistant

## 2011-03-27 ENCOUNTER — Ambulatory Visit: Payer: Medicare Other | Admitting: Critical Care Medicine

## 2011-03-27 VITALS — BP 140/80 | HR 81 | Temp 98.5°F | Wt 175.0 lb

## 2011-03-27 DIAGNOSIS — R030 Elevated blood-pressure reading, without diagnosis of hypertension: Secondary | ICD-10-CM

## 2011-03-27 DIAGNOSIS — J019 Acute sinusitis, unspecified: Secondary | ICD-10-CM

## 2011-03-27 DIAGNOSIS — R062 Wheezing: Secondary | ICD-10-CM

## 2011-03-27 MED ORDER — AMOXICILLIN-POT CLAVULANATE 875-125 MG PO TABS: 1.0000 | ORAL_TABLET | Freq: Two times a day (BID) | ORAL | Status: AC

## 2011-03-27 NOTE — Progress Notes (Signed)
  Subjective:    Patient ID: [name], female    DOB: 05-Jan-1946, 66 y.o.   MRN: [id number]  Sinusitis This is a new problem. The current episode started in the past 7 days. The problem has been gradually worsening since onset. The maximum temperature recorded prior to her arrival was 100 - 100.9 F. The fever has been present for less than 1 day. Her pain is at a severity of 5/10. The pain is mild. Associated symptoms include chills, congestion, coughing, ear pain, headaches, a hoarse voice, shortness of breath, sinus pressure, a sore throat and swollen glands. Pertinent negatives include no diaphoresis or neck pain. Past treatments include acetaminophen and oral decongestants (Mucinex). The treatment provided no relief.   Dr. Dwyane Luo follows her asthma. She has had to stop Allegra because it makes her too sleepy. Review of Systems  Constitutional: Positive for chills. Negative for diaphoresis.  HENT: Positive for ear pain, congestion, sore throat, hoarse voice and sinus pressure. Negative for neck pain.   Respiratory: Positive for cough and shortness of breath.   Neurological: Positive for headaches.       Objective:   Physical Exam  Constitutional: She is oriented to person, place, and time. She appears well-developed and well-nourished.  HENT:  Head: Normocephalic and atraumatic.  Right Ear: External ear normal.  Left Ear: External ear normal.  Nose: Nose normal.  Mouth/Throat: Oropharynx is clear and moist. No oropharyngeal exudate.  Eyes: Conjunctivae are normal.  Neck: Normal range of motion.       Bilateral anterior cervical lymph node enlargement with tenderness.   Cardiovascular: Normal rate, regular rhythm and normal heart sounds.   Pulmonary/Chest: Effort normal.       Mild wheezing at the base of both lungs.  Lymphadenopathy:    She has cervical adenopathy.  Neurological: She is alert and oriented to person, place, and time.  Skin: Skin is warm and dry.    Psychiatric: She has a normal mood and affect. Her behavior is normal.          Assessment & Plan:  Sinusitis-Start Augmentin twice a day for 10 days. Continue with symptomatic care. Stay hydrated and use rescue inhaler as needed for any wheezing or SOB.  Elevated blood pressure-Stop Mucinex DM and continue on Mucinex. The decongestant might be elevating blood pressure.   Discussed need for Tdap and colonoscopy. Patient is aware of importance and will get Tdap at next visit when she is feeling better. She will call for colonoscopy.

## 2011-03-27 NOTE — Patient Instructions (Signed)
Start Augmentin twice a day for 10 days. Continue with symptomatic care. Stay hydrated and use rescue inhaler as needed for any wheezing or SOB. Stop Mucinex DM and just use regular Mucinex.

## 2011-03-28 ENCOUNTER — Ambulatory Visit: Payer: Medicare Other | Admitting: Critical Care Medicine

## 2011-04-09 ENCOUNTER — Other Ambulatory Visit: Payer: Self-pay | Admitting: Critical Care Medicine

## 2011-04-11 ENCOUNTER — Ambulatory Visit (INDEPENDENT_AMBULATORY_CARE_PROVIDER_SITE_OTHER): Payer: Medicare Other | Admitting: Critical Care Medicine

## 2011-04-11 ENCOUNTER — Encounter: Payer: Self-pay | Admitting: Critical Care Medicine

## 2011-04-11 DIAGNOSIS — J45909 Unspecified asthma, uncomplicated: Secondary | ICD-10-CM

## 2011-04-11 NOTE — Progress Notes (Signed)
Subjective:    Patient ID: [name], female    DOB: [birth year], 66 y.o.   MRN: [id number]  HPI   F/U of   66 y.o.    white female with chronic allergic rhinitis and extrinsic asthma. The patient also has associated vocal cord dysfunction syndrome and reflux disease.    01/17/2011 Ill over thanksgiving.  Rx augmentin.  Had throat irritation, cough.  Seemed better, but now during day, worse, more hoarseness,  Still coughing.  Fatigued.  Notes some tightness in the throat area. OUt of nose is yellow to white.  Was worse before the augmentin.  Was bloody green. Occ mucus out of the lungs, occ loose.  White but not clear out of lungs. No heartburn>>Rx Augmentin x 10 days and steroid taper   12/17 Follow up  Pt returns for follow up. She was seen for asthmatic flare with sinusitis last ov. Tx w/ augmentin and steroid taper. She is feeling much better with decreased cough/congestion .  No wheezing or fever.  Has finished abx and steroids .   04/12/2011 Rx augmentin for 10days, just finished one week ago.  Pt with URI and sinus issues. Had discolored mucus in sinus and coughing up mucus.  Now feels better but cough is persistent No wheeze. Did wheeze at the end.     Past Medical History  Diagnosis Date  . Other and unspecified hyperlipidemia   . Unspecified arthropathy, hand   . Peptic ulcer, unspecified site, unspecified as acute or chronic, without mention of hemorrhage, perforation, or obstruction   . Allergic rhinitis, cause unspecified   . Unspecified asthma   . Benign paroxysmal positional vertigo   . Depressive disorder, not elsewhere classified   . Esophageal reflux   . Unspecified hypothyroidism   . Localized osteoarthrosis not specified whether primary or secondary, lower leg   . Other diseases of vocal cords      Family History  Problem Relation Age of Onset  . Heart disease Father   . Colon cancer Mother   . Liver cancer Brother   . Asthma Brother       History   Social History  . Marital Status: Married    Spouse Name: N/A    Number of Children: 3  . Years of Education: N/A   Occupational History  . Retired    Social History Main Topics  . Smoking status: Former Smoker -- 0.3 packs/day for 2 years    Types: Cigarettes    Quit date: 02/12/1958  . Smokeless tobacco: Never Used  . Alcohol Use: Yes     wine socially  . Drug Use: No  . Sexually Active: Not on file   Other Topics Concern  . Not on file   Social History Narrative  . No narrative on file     Allergies  Allergen Reactions  . Morphine     REACTION: Nausea  . Moxifloxacin     REACTION: confusion, dizziness, paranoia     Outpatient Prescriptions Prior to Visit  Medication Sig Dispense Refill  . albuterol (PROVENTIL HFA) 108 (90 BASE) MCG/ACT inhaler Inhale 2 puffs into the lungs every 6 (six) hours as needed.        . Ascorbic Acid (VITAMIN C) 500 MG tablet Take 500 mg by mouth 2 (two) times daily.        . ASTRAGALUS PO Take 1 capsule by mouth daily.        . Biotin (BIOTIN 5000) 5 MG CAPS  Take 1 capsule by mouth daily.        . budesonide (PULMICORT) 180 MCG/ACT inhaler Inhale 2 puffs into the lungs daily.        Marland Kitchen buPROPion (WELLBUTRIN XL) 150 MG 24 hr tablet Take 150 mg by mouth daily.        . Calcium Carbonate-Vit D-Min 600-400 MG-UNIT TABS 1 tablet by mouth 2-3 times daily      . Cholecalciferol (VITAMIN D3) 2000 UNITS TABS Take 1 tablet by mouth daily.        Marland Kitchen estradiol (MENOSTAR) 14 MCG/24HR Place 1 patch onto the skin once a week. Apply the patch to a clean, dry, non-oily skin area of your lower abdomen, hips below the waist, or buttocks that has little or no hair and is free of cuts or irritation.      . fluticasone (FLONASE) 50 MCG/ACT nasal spray USE 2 SPRAYS IN EACH NOSTRIL DAILY  16 g  1  . folic acid (FOLVITE) 1 MG tablet Take 1 mg by mouth 2 (two) times daily.        . Ginkgo Biloba 40 MG TABS Take 1 tablet by mouth 3 (three) times daily.  As needed      . Glucosamine HCl 1500 MG TABS Take 1 tablet by mouth daily.        . Guaifenesin (MUCINEX MAXIMUM STRENGTH) 1200 MG TB12 Take 1 tablet by mouth 2 (two) times daily. As needed      . levothyroxine (SYNTHROID, LEVOTHROID) 75 MCG tablet Take 75 mcg by mouth daily. Except Sunday        . levothyroxine (SYNTHROID, LEVOTHROID) 88 MCG tablet Take only sunday       . LYSINE PO Once daily       . metroNIDAZOLE (METROGEL) 1 % gel Apply topically daily.  45 g  1  . omeprazole-sodium bicarbonate (ZEGERID) 40-1100 MG per capsule Take 1 capsule by mouth daily before breakfast.        . PARoxetine (PAXIL-CR) 12.5 MG 24 hr tablet Take 12.5 mg by mouth 2 (two) times daily.        . sodium chloride (BRONCHO SALINE) inhaler solution Saline wash twice daily      . Multiple Vitamin (MULTIVITAMIN) tablet Take 1 tablet by mouth daily.        . Omega-3 Fatty Acids (FISH OIL) 1000 MG CAPS HOLD FOR TWO WEEKS then resume 1 capsule daily      . clonazePAM (KLONOPIN) 0.5 MG tablet 1/4 by mouth daily as needed      . fexofenadine (ALLEGRA) 180 MG tablet HOLD for 10days then resume         Review of Systems  Constitutional:   No  weight loss, night sweats,  Fevers, chills, fatigue, lassitude. HEENT:   No headaches,  Difficulty swallowing,  Tooth/dental problems   CV:  No chest pain,  Orthopnea, PND, swelling in lower extremities, anasarca, dizziness, palpitations  GI  No heartburn, indigestion, abdominal pain, nausea, vomiting, diarrhea, change in bowel habits, loss of appetite  Resp:  .  No excess mucus   No coughing up of blood.  No change in color of mucus.    No chest wall deformity  Skin: no rash or lesions.  GU: no dysuria, change in color of urine, no urgency or frequency.  No flank pain.  MS:  No joint pain or swelling.  No decreased range of motion.  No back pain.  Psych:  No change in mood  or affect. No depression or anxiety.  No memory loss.     Objective:   Physical Exam BP  140/76  Pulse 93  Temp(Src) 98.1 F (36.7 C) (Oral)  Ht 5\' 4"  (1.626 m)  Wt 176 lb 3.2 oz (79.924 kg)  BMI 30.24 kg/m2  SpO2 95%  Gen: Pleasant, well-nourished, in no distress,  normal affect  ENT: NM pale with clear mucus, no  post nasal drip  Neck: No JVD, no TMG, no carotid bruits  Lungs: No use of accessory muscles, no dullness to percussion, no wheeze    Cardiovascular: RRR, heart sounds normal, no murmur or gallops, no peripheral edema  Abdomen: soft and NT, no HSM,  BS normal  Musculoskeletal: No deformities, no cyanosis or clubbing  Neuro: alert, non focal  Skin: Warm, no lesions or rashes             Assessment & Plan:   Asthma with allergic rhinitis Recent asthma flare d/t sinusitis now resolved Plan  Cont inue  Maintenance meds       Updated Medication List Outpatient Encounter Prescriptions as of 04/11/2011  Medication Sig Dispense Refill  . albuterol (PROVENTIL HFA) 108 (90 BASE) MCG/ACT inhaler Inhale 2 puffs into the lungs every 6 (six) hours as needed.        . Ascorbic Acid (VITAMIN C) 500 MG tablet Take 500 mg by mouth 2 (two) times daily.        . ASTRAGALUS PO Take 1 capsule by mouth daily.        . Biotin (BIOTIN 5000) 5 MG CAPS Take 1 capsule by mouth daily.        . budesonide (PULMICORT) 180 MCG/ACT inhaler Inhale 2 puffs into the lungs daily.        Marland Kitchen buPROPion (WELLBUTRIN XL) 150 MG 24 hr tablet Take 150 mg by mouth daily.        . Calcium Carbonate-Vit D-Min 600-400 MG-UNIT TABS 1 tablet by mouth 2-3 times daily      . Cholecalciferol (VITAMIN D3) 2000 UNITS TABS Take 1 tablet by mouth daily.        Marland Kitchen estradiol (MENOSTAR) 14 MCG/24HR Place 1 patch onto the skin once a week. Apply the patch to a clean, dry, non-oily skin area of your lower abdomen, hips below the waist, or buttocks that has little or no hair and is free of cuts or irritation.      . fluticasone (FLONASE) 50 MCG/ACT nasal spray USE 2 SPRAYS IN EACH NOSTRIL DAILY  16 g   1  . folic acid (FOLVITE) 1 MG tablet Take 1 mg by mouth 2 (two) times daily.        . Ginkgo Biloba 40 MG TABS Take 1 tablet by mouth 3 (three) times daily. As needed      . Glucosamine HCl 1500 MG TABS Take 1 tablet by mouth daily.        . Guaifenesin (MUCINEX MAXIMUM STRENGTH) 1200 MG TB12 Take 1 tablet by mouth 2 (two) times daily. As needed      . levothyroxine (SYNTHROID, LEVOTHROID) 75 MCG tablet Take 75 mcg by mouth daily. Except Sunday        . levothyroxine (SYNTHROID, LEVOTHROID) 88 MCG tablet Take only sunday       . LYSINE PO Once daily       . metroNIDAZOLE (METROGEL) 1 % gel Apply topically daily.  45 g  1  . Omega-3 Fatty Acids (FISH OIL) 1000 MG  CAPS Take 1 capsule by mouth daily.      Marland Kitchen omeprazole-sodium bicarbonate (ZEGERID) 40-1100 MG per capsule Take 1 capsule by mouth daily before breakfast.        . PARoxetine (PAXIL-CR) 12.5 MG 24 hr tablet Take 12.5 mg by mouth 2 (two) times daily.        . sodium chloride (BRONCHO SALINE) inhaler solution Saline wash twice daily      . UNABLE TO FIND Take 2 oz by mouth daily. Med Name: VEMMA (multivitamin vitamin liquid from mangosteen)      . DISCONTD: Multiple Vitamin (MULTIVITAMIN) tablet Take 1 tablet by mouth daily.        Marland Kitchen DISCONTD: Omega-3 Fatty Acids (FISH OIL) 1000 MG CAPS HOLD FOR TWO WEEKS then resume 1 capsule daily      . clonazePAM (KLONOPIN) 0.5 MG tablet 1/4 by mouth daily as needed      . DISCONTD: fexofenadine (ALLEGRA) 180 MG tablet HOLD for 10days then resume

## 2011-04-11 NOTE — Patient Instructions (Signed)
No change in medications. Return in         4 months 

## 2011-04-12 NOTE — Assessment & Plan Note (Signed)
Recent asthma flare d/t sinusitis now resolved Plan  Cont inue  Maintenance meds

## 2011-04-20 ENCOUNTER — Other Ambulatory Visit: Payer: Self-pay | Admitting: Physician Assistant

## 2011-04-20 ENCOUNTER — Ambulatory Visit (INDEPENDENT_AMBULATORY_CARE_PROVIDER_SITE_OTHER): Payer: Medicare Other | Admitting: Physician Assistant

## 2011-04-20 ENCOUNTER — Encounter: Payer: Self-pay | Admitting: Physician Assistant

## 2011-04-20 VITALS — BP 147/78 | HR 82 | Temp 99.0°F | Ht 62.0 in | Wt 176.0 lb

## 2011-04-20 DIAGNOSIS — J069 Acute upper respiratory infection, unspecified: Secondary | ICD-10-CM

## 2011-04-20 DIAGNOSIS — J029 Acute pharyngitis, unspecified: Secondary | ICD-10-CM

## 2011-04-20 MED ORDER — BENZONATATE 100 MG PO CAPS: 100.0000 mg | ORAL_CAPSULE | Freq: Three times a day (TID) | ORAL | Status: AC | PRN

## 2011-04-20 NOTE — Progress Notes (Signed)
  Subjective:    Patient ID: [name], female    DOB: 07-09-1945, 66 y.o.   MRN: [id number]  HPI Patient presents to the clinic for 2 days of sore throat and not feeling well. 2 days ago her throat has started to hurt and she has had dry cough. She states that her temperature has been around 99, she has had chills, and some SOB. She has not had to use her inhalers for breathing. She has been using mucinex and sucking on jolly ranchers which help some. The cough is worse when she lays down. She has not had any exposure to strep that she knows of but is around young children a lot. She denies any headache or dizziness. She is able to eat without problems and denies any abdominal pain or muscle aches. She has been on 3 rounds of antibiotic for different things since October.      Review of Systems     Objective:   Physical Exam  Constitutional: She is oriented to person, place, and time. She appears well-developed and well-nourished.  HENT:  Head: Normocephalic and atraumatic.  Right Ear: External ear normal.  Left Ear: External ear normal.  Nose: Nose normal.  Mouth/Throat: No oropharyngeal exudate.       TM's normal with light reflex. No maxillary tenderness. Oropharynx with erythema.  Eyes: Conjunctivae are normal.  Neck: Normal range of motion. Neck supple.  Cardiovascular: Normal rate, regular rhythm and normal heart sounds.   Pulmonary/Chest: Effort normal and breath sounds normal. She has no wheezes.  Neurological: She is alert and oriented to person, place, and time.  Skin: Skin is warm and dry.  Psychiatric: She has a normal mood and affect. Her behavior is normal.          Assessment & Plan:  URI- Rapid strep negative. Will send off culture for strep throat. Instructed to take Mucinex twice a day with lots of water. Gave Tessalon pearles for cough to use up to three times a day. Encouraged Vitamin C and Zinc tablets to strengthen immune system. Instructed to call  or schedule another appointment if she had to start using her inhaler or felt she was getting worse. She has been on 3 antibiotics since October 2012.   Elevated Blood pressure- Discussed not to take any decongestants when sick because of the elevation of blood pressure. Could be increased due to sickness. We need to recheck when comes in for Tdap.  Discussed need for colonoscopy and Tdap. She states once she starts feeling better she will call and make appointment.

## 2011-04-20 NOTE — Patient Instructions (Addendum)
Continue taking mucinex twice a day. Take allegra half tab at night for allergies when mucinex is stopped. Will give you tessalon pearle to use up to three times a day. Vitamin C and Cold-eeze tabs to help strengthen immune system.

## 2011-04-22 LAB — CULTURE, GROUP A STREP

## 2011-04-23 ENCOUNTER — Telehealth: Payer: Self-pay | Admitting: *Deleted

## 2011-04-23 NOTE — Telephone Encounter (Signed)
Line busy. Unable to LM 

## 2011-04-23 NOTE — Telephone Encounter (Signed)
If not better in few days we will give antibiotic. I really think this is viral. Throat culture came back negative.

## 2011-04-23 NOTE — Telephone Encounter (Signed)
Pt wanted me to inform you that she stayed sick over the weekend and is staying in the bed today. States that she is still having symptoms but no fever over 101. States that she is still using the tessalon Pearls and taking the Mucinex. States that if she doesn't get better in the next couple of days she will call back.   FYI

## 2011-04-24 NOTE — Telephone Encounter (Signed)
pt informed

## 2011-05-03 ENCOUNTER — Ambulatory Visit (INDEPENDENT_AMBULATORY_CARE_PROVIDER_SITE_OTHER): Payer: Medicare Other | Admitting: Family Medicine

## 2011-05-03 ENCOUNTER — Ambulatory Visit
Admission: RE | Admit: 2011-05-03 | Discharge: 2011-05-03 | Disposition: A | Discharge status: Home / Self Care | Payer: Medicare Other | Source: Ambulatory Visit

## 2011-05-03 ENCOUNTER — Ambulatory Visit
Admission: RE | Admit: 2011-05-03 | Discharge: 2011-05-03 | Disposition: A | Discharge status: Home / Self Care | Payer: Medicare Other | Source: Ambulatory Visit | Attending: Family Medicine | Admitting: Family Medicine

## 2011-05-03 ENCOUNTER — Encounter: Payer: Self-pay | Admitting: Family Medicine

## 2011-05-03 VITALS — BP 141/80 | HR 79 | Ht 62.0 in | Wt 176.0 lb

## 2011-05-03 DIAGNOSIS — G8929 Other chronic pain: Secondary | ICD-10-CM

## 2011-05-03 DIAGNOSIS — Z9181 History of falling: Secondary | ICD-10-CM

## 2011-05-03 DIAGNOSIS — E669 Obesity, unspecified: Secondary | ICD-10-CM

## 2011-05-03 DIAGNOSIS — M545 Low back pain, unspecified: Secondary | ICD-10-CM

## 2011-05-03 DIAGNOSIS — Z1331 Encounter for screening for depression: Secondary | ICD-10-CM

## 2011-05-03 MED ORDER — PREDNISONE 20 MG PO TABS: 40.0000 mg | ORAL_TABLET | Freq: Every day | ORAL | Status: AC

## 2011-05-03 MED ORDER — CYCLOBENZAPRINE HCL 10 MG PO TABS: 10.0000 mg | ORAL_TABLET | Freq: Every evening | ORAL | Status: AC | PRN

## 2011-05-03 NOTE — Progress Notes (Signed)
  Subjective:    Patient ID: [name], female    DOB: 06/18/1945, 66 y.o.   MRN: [id number]  HPI Using fitness pal on her phone and she is eating 1500 calories a day and has been working out but hasn't been able to lose weight.  Has several viral illnesses over the winter and hasn't been able to work out regularly. Start personal training and was doing squats. Next days had viral pharyngitis and had a generalized aching. Overall she is getting etter except for her low back. She went to a chiropractor.  Has been going to Dr. Lucretia Field for 6 years.  Has been going weekly lately.  Saw her endocrinologist who recommended weight loss.  Did have some sciatic from laying around and that is acutallly better.  Has been using biofreeze on it. Pian is across her low back.  Gets GI irritation with Advil.    Review of Systems     Objective:   Physical Exam  Constitutional: She is oriented to person, place, and time. She appears well-developed and well-nourished.  HENT:  Head: Normocephalic and atraumatic.  Musculoskeletal:       Normal flexion, dec extension, dec rot right and left but symmetric. Normal side bending.  Tender over bilat SI joints. Neg straight leg raise, Hip ,knee, ankle strength 5/5 bilaterally.   Neurological: She is alert and oriented to person, place, and time.  Skin: Skin is warm and dry.  Psychiatric: She has a normal mood and affect. Her behavior is normal.          Assessment & Plan:  Low BAck pain- Since getting worse and has been seeing [place] chiropractor [place].  Xray today. Based on results consider PT vs MRI. Trial of steorids and muscle relaxer at bedtime, Avoid NSAIDs until completes steroids.  Can use Tylenol for extra pain control.   Obesity - She has been working on diet and exercise without sig success.  Consider working with nutritionist or consider weight loss med if BP is well controlled.  Will discussed further at f/u visit in 4 weeks.   Follow  assessment-score of 1, low risk. Further action needed.  Depression screening-PHQ 9 score is 0. Negative for depression.

## 2011-05-03 NOTE — Patient Instructions (Signed)
Sacroiliac Joint Dysfunction The sacroiliac joint connects the lower part of the spine (the sacrum) with the bones of the pelvis. CAUSES  Sometimes, there is no obvious reason for sacroiliac joint dysfunction. Other times, it may occur   During pregnancy.   After injury, such as:   Car accidents.   Sport-related injuries.   Work-related injuries.   Due to one leg being shorter than the other.   Due to other conditions that affect the joints, such as:   Rheumatoid arthritis.   Gout.   Psoriasis.   Joint infection (septic arthritis).  SYMPTOMS  Symptoms may include:  Pain in the:   Lower back.   Buttocks.   Groin.   Thighs and legs.   Difficult sitting, standing, walking, lying, bending or lifting.  DIAGNOSIS  A number of tests may be used to help diagnose the cause of sacroiliac joint dysfunction, including:  Imaging tests to look for other causes of pain, including:   MRI.   CT scan.   Bone scan.   Diagnostic injection: During a special x-ray (called fluoroscopy), a needle is put into the sacroiliac joint. A numbing medicine is injected into the joint. If the pain is improved or stopped, the diagnosis of sacroiliac joint dysfunction is more likely.  TREATMENT  There are a number of types of treatment used for sacroiliac joint dysfunction, including:  Only take over-the-counter or prescription medicines for pain, discomfort, or fever as directed by your caregiver.   Medications to relax muscles.   Rest. Decreasing activity can help cut down on painful muscle spasms and allow the back to heal.   Application of heat or ice to the lower back may improve muscle spasms and soothe pain.   Brace. A special back brace, called a sacroiliac belt, can help support the joint while your back is healing.   Physical therapy can help teach comfortable positions and exercises to strengthen muscles that support the sacroiliac joint.   Cortisone injections. Injections  of steroid medicine into the joint can help decrease swelling and improve pain.   Hyaluronic acid injections. This chemical improves lubrication within the sacroiliac joint, thereby decreasing pain.   Radiofrequency ablation. A special needle is placed into the joint, where it burns away nerves that are carrying pain messages from the joint.   Surgery. Because pain occurs during movement of the joint, screws and plates may be installed in order to limit or prevent joint motion.  HOME CARE INSTRUCTIONS   Take all medications exactly as directed.   Follow instructions regarding both rest and physical activity, to avoid worsening the pain.   Do physical therapy exercises exactly as prescribed.  SEEK IMMEDIATE MEDICAL CARE IF:  You experience increasingly severe pain.   You develop new symptoms, such as numbness or tingling in your legs or feet.   You lose bladder or bowel control.  Document Released: 04/27/2008 Document Revised: 01/18/2011 Document Reviewed: 04/27/2008 [employer] Patient Information 2012 [employer]. 

## 2011-05-04 ENCOUNTER — Telehealth: Payer: Self-pay | Admitting: *Deleted

## 2011-05-04 DIAGNOSIS — M1990 Unspecified osteoarthritis, unspecified site: Secondary | ICD-10-CM

## 2011-05-04 NOTE — Telephone Encounter (Signed)
PT referral enetered

## 2011-05-15 ENCOUNTER — Ambulatory Visit
Admission: RE | Admit: 2011-05-15 | Discharge: 2011-05-15 | Disposition: A | Discharge status: Home / Self Care | Payer: Medicare Other | Source: Ambulatory Visit | Attending: Physician Assistant | Admitting: Physician Assistant

## 2011-05-15 ENCOUNTER — Ambulatory Visit (INDEPENDENT_AMBULATORY_CARE_PROVIDER_SITE_OTHER): Payer: Medicare Other | Admitting: Physician Assistant

## 2011-05-15 ENCOUNTER — Encounter: Payer: Self-pay | Admitting: Physician Assistant

## 2011-05-15 VITALS — BP 146/85 | HR 89 | Temp 99.3°F | Wt 177.0 lb

## 2011-05-15 DIAGNOSIS — R05 Cough: Secondary | ICD-10-CM

## 2011-05-15 DIAGNOSIS — J3489 Other specified disorders of nose and nasal sinuses: Secondary | ICD-10-CM

## 2011-05-15 DIAGNOSIS — J329 Chronic sinusitis, unspecified: Secondary | ICD-10-CM

## 2011-05-15 NOTE — Patient Instructions (Signed)
Get x-ray of chest today. Will call with results. Will order CT of sinuses to check for chronic sinusitis. Continue with current therapy and will call with next step.

## 2011-05-15 NOTE — Progress Notes (Signed)
  Subjective:    Patient ID: [name], female    DOB: 1945/12/24, 66 y.o.   MRN: [id number]  HPI Patient presents to clinic because she still has sinus pressure and nasal congestion after 3-57months of antibiotics, prednisone, cough syrup.She reports that she once had a sinus infection that would not go away and had to have sinus lavage. She just finished prednisone for 7 days and started back on allergra 1/2 tab daily. ON a daily basis she get green mucus from nose and throat. She still feels some chest tightness and has a dry cough on a daily basis. She has been running a low grade fever on a off for the last 2 weeks. She also uses flonase daily but has not helped.   Discussed blood pressure never been a problem in the past. Still taking decongestant Still denies health mainatince until she is feeling better.   Review of Systems     Objective:   Physical Exam  Constitutional: She is oriented to person, place, and time. She appears well-developed and well-nourished.  HENT:  Head: Normocephalic and atraumatic.  Mouth/Throat: No oropharyngeal exudate.       TM's normal. Maxillary tenderness bilaterally along with frontal sinus tenderness. Oropharynx erythematous with drainage going down back of throat. Turbinates red and swollen.   Eyes: Conjunctivae are normal.  Cardiovascular: Normal rate, regular rhythm and normal heart sounds.   Pulmonary/Chest: Effort normal and breath sounds normal. She has no wheezes.  Lymphadenopathy:    She has no cervical adenopathy.  Neurological: She is alert and oriented to person, place, and time.  Skin: Skin is warm and dry.  Psychiatric: She has a normal mood and affect. Her behavior is normal.          Assessment & Plan:  Sinus pressure- Patient has been on multiple antibiotics and steroids and still complains of sinus pressure, nasal congestion, and green drainage. Will get CT of sinuses to rule out chronic sinusitis. We will send to ENT if  CT normal or after trying treatment of chronic sinusitis.   Cough- since this has been ongoing for a few months with multiple antibiotics will get a chest xray and call with results.

## 2011-05-16 ENCOUNTER — Encounter: Payer: Self-pay | Admitting: *Deleted

## 2011-05-21 ENCOUNTER — Ambulatory Visit
Admission: RE | Admit: 2011-05-21 | Discharge: 2011-05-21 | Disposition: A | Discharge status: Home / Self Care | Payer: Medicare Other | Source: Ambulatory Visit | Attending: Physician Assistant | Admitting: Physician Assistant

## 2011-05-21 DIAGNOSIS — J3489 Other specified disorders of nose and nasal sinuses: Secondary | ICD-10-CM

## 2011-05-23 ENCOUNTER — Other Ambulatory Visit: Payer: Self-pay | Admitting: Physician Assistant

## 2011-05-23 MED ORDER — AMOXICILLIN-POT CLAVULANATE 875-125 MG PO TABS: 1.0000 | ORAL_TABLET | Freq: Two times a day (BID) | ORAL | Status: AC

## 2011-06-18 ENCOUNTER — Encounter: Payer: Self-pay | Admitting: Physician Assistant

## 2011-06-18 ENCOUNTER — Ambulatory Visit (INDEPENDENT_AMBULATORY_CARE_PROVIDER_SITE_OTHER): Payer: Medicare Other | Admitting: Physician Assistant

## 2011-06-18 VITALS — BP 137/83 | HR 81 | Temp 98.3°F | Ht 62.0 in | Wt 172.0 lb

## 2011-06-18 DIAGNOSIS — J309 Allergic rhinitis, unspecified: Secondary | ICD-10-CM

## 2011-06-18 DIAGNOSIS — J302 Other seasonal allergic rhinitis: Secondary | ICD-10-CM

## 2011-06-18 DIAGNOSIS — R030 Elevated blood-pressure reading, without diagnosis of hypertension: Secondary | ICD-10-CM

## 2011-06-18 DIAGNOSIS — J329 Chronic sinusitis, unspecified: Secondary | ICD-10-CM

## 2011-06-18 MED ORDER — AZITHROMYCIN 250 MG PO TABS: ORAL_TABLET | ORAL | Status: AC

## 2011-06-18 NOTE — Patient Instructions (Signed)
Start back on allergy regimen for 2 days if not improved then start Zpak. Will make referral to ENT.

## 2011-06-18 NOTE — Progress Notes (Signed)
  Subjective:    Patient ID: [name], female    DOB: 09/27/1945, 66 y.o.   MRN: [id number]  HPI Patient presents to the clinic with worsening sinus pressure, cough, ear pain. Patient has a history of recurrent sinus infections and allergies. She normally takes these snacks twice a day, Allegra, and Flonase daily. She recently stopped Allegra and Mucinex thinking that this might be elevating her blood pressure. She checks her blood pressure periodically at home. She states that her blood pressure has been running in the high 140s to low 150s on top range. About a week ago her ears started to her. Her right ear hurts worse than her left ear. She started to have a lot of sinus pressure and nasal congestion. She denies any shortness of breath or wheezing. She has had a nonproductive cough. She has not tried anything to make this better. She's recently started methotrexate and she is scared to go on any medications due to numerous interactions. She has seen ENT in the past but not recently. She recently had a CT scan of her sinuses and was normal.    Review of Systems     Objective:   Physical Exam  Constitutional: She is oriented to person, place, and time. She appears well-developed and well-nourished.  HENT:  Head: Normocephalic and atraumatic.       Left TM normal. Right TM visible light reflex and view of the ossicles with slight TM bulging. Oropharynx red with postnasal drainage. Bilateral turbinates red and swollen. Maxillary tenderness bilaterally with palpation.  Eyes:       Bilateral conjunctiva injected.  Neck: Normal range of motion. Neck supple.       Bilateral superficial anterior cervical lymph nodes enlarged without tenderness to palpation  Cardiovascular: Normal rate, regular rhythm and normal heart sounds.   Pulmonary/Chest: Effort normal and breath sounds normal. She has no wheezes.  Neurological: She is alert and oriented to person, place, and time.  Skin: Skin is  warm and dry.  Psychiatric: She has a normal mood and affect. Her behavior is normal.          Assessment & Plan:  Recurrent sinus infections/seasonal allergies-patient has came in a couple times with recurrent sinus infections and exacerbations of allergies. I suggested to patient that she go back on allergy regimen of Allegra, Mucinex twice a day, and Flonase. She went off these because she thought that might be affecting her blood pressure. I reassured her that these are not affecting her blood pressure of long as they did not have the decongestant in it. Patient was suggested to start back on regular allergy regimen for at least the next 2 days if still not improved or worsening she could start a Z-Pak. I talked to her and I think that the best plan is to get her in with EENT. I will refer to call for appointment.   Elevated blood pressure-I did discuss with patient about hypertension. I reassured her that she was not a candidate for medication at this point. I talked with her how diet and weight loss can greatly affect her blood pressure. I did encourage her to keep taking her blood pressure and reporting it blood pressure got above 150/90.

## 2011-07-08 ENCOUNTER — Other Ambulatory Visit: Payer: Self-pay | Admitting: Critical Care Medicine

## 2011-07-13 ENCOUNTER — Other Ambulatory Visit: Payer: Self-pay | Admitting: Critical Care Medicine

## 2011-07-26 ENCOUNTER — Ambulatory Visit (INDEPENDENT_AMBULATORY_CARE_PROVIDER_SITE_OTHER): Payer: BC Managed Care – PPO | Admitting: Family Medicine

## 2011-07-26 ENCOUNTER — Encounter: Payer: Self-pay | Admitting: Family Medicine

## 2011-07-26 VITALS — BP 133/74 | HR 94 | Ht 62.0 in | Wt 174.0 lb

## 2011-07-26 DIAGNOSIS — M545 Low back pain: Secondary | ICD-10-CM

## 2011-07-26 DIAGNOSIS — J45909 Unspecified asthma, uncomplicated: Secondary | ICD-10-CM

## 2011-07-26 NOTE — Progress Notes (Signed)
  Subjective:    Patient ID: [name], female    DOB: [birth year], 66 y.o.   MRN: [id number]  HPI Followup for March for low back pain. We did an x-ray at that time that showed some arthritis and some curvature at L2. We referred her to physical therapy.Say the PT helped acutely.  Still going once a week. Says she is 68% better. No meds currently for pain relief.  Sees her chiropractor every 2-3 weeks.    Wants me to listen to her lungs, had a slight a cough. No fever.   Hx of asthma.  Uses pulmicort twice a day  Last used albuterol a year ago. Does carry it with her. No URI sxs.  Has chronic mucous production and takes mucinex daily. Also uses nasal saline regularly.     Review of Systems     Objective:   Physical Exam  Constitutional: She is oriented to person, place, and time. She appears well-developed and well-nourished.  HENT:  Head: Normocephalic and atraumatic.  Cardiovascular: Normal rate, regular rhythm and normal heart sounds.   Pulmonary/Chest: Effort normal and breath sounds normal.       Slight wheeze on the left that disappeared after deep breaths.   Musculoskeletal:       Lumbar spine with normal flexion, extension, rotation right and left, side bending. She is nontender in the lumbar spine or SI joints. Negative straight leg raise bilaterally. Hip, knee, ankle strength is 5 over 5 bilaterally.  Neurological: She is alert and oriented to person, place, and time.  Skin: Skin is warm and dry.  Psychiatric: She has a normal mood and affect. Her behavior is normal.          Assessment & Plan:  Low Back Pain - Ok to discontinue physical therapy at this time. I do feel that she is back to her baseline is over the acute episode. Continue home exercises and stretches to continue seeing her chiropractor every 2-3 weeks for maintenance adjustments. Call if having any recurrence or problems.  Asthma - peak flows are in the green zone today. Encouraged to go home and use  her inhaler 2 puffs and see if she feels it provides any relief at the wheezing disappears. Call if she suddenly gets worse, developed a cough, fever or increased shortness of breath. Continue pulmicort.

## 2011-08-01 ENCOUNTER — Other Ambulatory Visit: Payer: Self-pay | Admitting: Obstetrics and Gynecology

## 2011-08-01 ENCOUNTER — Ambulatory Visit: Payer: Medicare Other | Admitting: Critical Care Medicine

## 2011-08-01 DIAGNOSIS — Z1231 Encounter for screening mammogram for malignant neoplasm of breast: Secondary | ICD-10-CM

## 2011-08-02 ENCOUNTER — Encounter: Payer: Self-pay | Admitting: Critical Care Medicine

## 2011-08-02 ENCOUNTER — Ambulatory Visit (INDEPENDENT_AMBULATORY_CARE_PROVIDER_SITE_OTHER): Payer: BC Managed Care – PPO | Admitting: Critical Care Medicine

## 2011-08-02 VITALS — BP 132/84 | HR 75 | Temp 97.9°F | Ht 63.0 in | Wt 174.4 lb

## 2011-08-02 DIAGNOSIS — J45909 Unspecified asthma, uncomplicated: Secondary | ICD-10-CM

## 2011-08-02 NOTE — Assessment & Plan Note (Signed)
Moderate persistent asthma with recent flare due to atopic features Plan Increase Pulmicort to 2 inhalations twice daily for 3 weeks then reduce to 2 puffs daily thereafter

## 2011-08-02 NOTE — Progress Notes (Signed)
Subjective:    Patient ID: [name], female    DOB: Jul 15, 1945, 66 y.o.   MRN: [id number]  HPI   F/U of   66 y.o.    white female with chronic allergic rhinitis and extrinsic asthma. The patient also has associated vocal cord dysfunction syndrome and reflux disease.     08/02/2011 Now on methotrexate x 10weeks with Azzie Roup.  Now dx with RA.  Has all the symptoms in the hands and feet.  Gets this as an injection once weekly. Pt self injects. Tolerating well so far.   Pt will note some cough and wheezing.  Pt did injure lower back in 4/13 and Rx PT.    Past Medical History  Diagnosis Date  . Other and unspecified hyperlipidemia   . Unspecified arthropathy, hand   . Peptic ulcer, unspecified site, unspecified as acute or chronic, without mention of hemorrhage, perforation, or obstruction   . Allergic rhinitis, cause unspecified   . Unspecified asthma   . Benign paroxysmal positional vertigo   . Depressive disorder, not elsewhere classified   . Esophageal reflux   . Unspecified hypothyroidism   . Localized osteoarthrosis not specified whether primary or secondary, lower leg   . Other diseases of vocal cords      Family History  Problem Relation Age of Onset  . Heart disease Father   . Colon cancer Mother   . Liver cancer Brother   . Asthma Brother      History   Social History  . Marital Status: Married    Spouse Name: N/A    Number of Children: 3  . Years of Education: N/A   Occupational History  . Retired    Social History Main Topics  . Smoking status: Former Smoker -- 0.3 packs/day for 2 years    Types: Cigarettes    Quit date: 02/12/1958  . Smokeless tobacco: Never Used  . Alcohol Use: Yes     wine socially  . Drug Use: No  . Sexually Active: Not on file   Other Topics Concern  . Not on file   Social History Narrative  . No narrative on file     Allergies  Allergen Reactions  . Morphine     REACTION: Nausea  . Moxifloxacin    REACTION: confusion, dizziness, paranoia     Outpatient Prescriptions Prior to Visit  Medication Sig Dispense Refill  . albuterol (PROVENTIL HFA) 108 (90 BASE) MCG/ACT inhaler Inhale 2 puffs into the lungs every 6 (six) hours as needed.        . Ascorbic Acid (VITAMIN C) 500 MG tablet Take 500 mg by mouth 2 (two) times daily.        . ASTRAGALUS PO Take 1 capsule by mouth daily.        . Biotin (BIOTIN 5000) 5 MG CAPS Take 1 capsule by mouth daily.        Marland Kitchen buPROPion (WELLBUTRIN XL) 150 MG 24 hr tablet Take 150 mg by mouth daily.        . Calcium Carbonate-Vit D-Min 600-400 MG-UNIT TABS 1 tablet by mouth 2 times daily      . Cholecalciferol (VITAMIN D3) 2000 UNITS TABS Take 1 tablet by mouth daily.        . clonazePAM (KLONOPIN) 0.5 MG tablet 1/4 by mouth daily as needed      . estradiol (MENOSTAR) 14 MCG/24HR Place 1 patch onto the skin once a week. Apply the patch to a clean,  dry, non-oily skin area of your lower abdomen, hips below the waist, or buttocks that has little or no hair and is free of cuts or irritation.      . fluticasone (FLONASE) 50 MCG/ACT nasal spray USE 2 SPRAYS IN EACH NOSTRIL DAILY  16 g  2  . folic acid (FOLVITE) 1 MG tablet Take 1 mg by mouth 2 (two) times daily.        . Ginkgo Biloba 40 MG TABS Take 1 tablet by mouth 3 (three) times daily. As needed      . Glucosamine HCl 1500 MG TABS Take 1 tablet by mouth daily.        . Guaifenesin (MUCINEX MAXIMUM STRENGTH) 1200 MG TB12 Take 1 tablet by mouth 2 (two) times daily. As needed      . levothyroxine (SYNTHROID, LEVOTHROID) 75 MCG tablet Take 75 mcg by mouth daily. Except Sunday        . levothyroxine (SYNTHROID, LEVOTHROID) 88 MCG tablet Take only sunday       . LYSINE PO Once daily       . METHOTREXATE SODIUM, PF, IJ Inject 0.8 mg/mL as directed once a week.       . metroNIDAZOLE (METROGEL) 1 % gel Apply topically daily.  45 g  1  . milk thistle 175 MG tablet Take 175 mg by mouth daily.      Marland Kitchen omeprazole-sodium  bicarbonate (ZEGERID) 40-1100 MG per capsule Take 1 capsule by mouth daily before breakfast.        . PARoxetine (PAXIL-CR) 12.5 MG 24 hr tablet Take 12.5 mg by mouth daily.       . sodium chloride (BRONCHO SALINE) inhaler solution Saline wash twice daily      . UNABLE TO FIND Take 2 oz by mouth daily. Med Name: VEMMA (multivitamin vitamin liquid from mangosteen)      . PULMICORT FLEXHALER 180 MCG/ACT inhaler USE 2 PUFFS TWICE DAILY  1 each  0     Review of Systems  Constitutional:   No  weight loss, night sweats,  Fevers, chills, fatigue, lassitude. HEENT:   No headaches,  Difficulty swallowing,  Tooth/dental problems   CV:  No chest pain,  Orthopnea, PND, swelling in lower extremities, anasarca, dizziness, palpitations  GI  No heartburn, indigestion, abdominal pain, nausea, vomiting, diarrhea, change in bowel habits, loss of appetite  Resp:  .  No excess mucus   No coughing up of blood.  No change in color of mucus.    No chest wall deformity  Skin: no rash or lesions.  GU: no dysuria, change in color of urine, no urgency or frequency.  No flank pain.  MS:  No joint pain or swelling.  No decreased range of motion.  No back pain.  Psych:  No change in mood or affect. No depression or anxiety.  No memory loss.     Objective:   Physical Exam BP 132/84  Pulse 75  Temp 97.9 F (36.6 C) (Oral)  Ht 5\' 3"  (1.6 m)  Wt 174 lb 6.4 oz (79.107 kg)  BMI 30.89 kg/m2  SpO2 96%  Gen: Pleasant, well-nourished, in no distress,  normal affect  ENT: NM pale with clear mucus, no  post nasal drip  Neck: No JVD, no TMG, no carotid bruits  Lungs: No use of accessory muscles, no dullness to percussion, no wheeze    Cardiovascular: RRR, heart sounds normal, no murmur or gallops, no peripheral edema  Abdomen: soft and NT,  no HSM,  BS normal  Musculoskeletal: No deformities, no cyanosis or clubbing  Neuro: alert, non focal  Skin: Warm, no lesions or rashes             Assessment  & Plan:   Asthma with allergic rhinitis Moderate persistent asthma with recent flare due to atopic features Plan Increase Pulmicort to 2 inhalations twice daily for 3 weeks then reduce to 2 puffs daily thereafter    Updated Medication List Outpatient Encounter Prescriptions as of 08/02/2011  Medication Sig Dispense Refill  . albuterol (PROVENTIL HFA) 108 (90 BASE) MCG/ACT inhaler Inhale 2 puffs into the lungs every 6 (six) hours as needed.        . Ascorbic Acid (VITAMIN C) 500 MG tablet Take 500 mg by mouth 2 (two) times daily.        . ASTRAGALUS PO Take 1 capsule by mouth daily.        . Biotin (BIOTIN 5000) 5 MG CAPS Take 1 capsule by mouth daily.        . budesonide (PULMICORT FLEXHALER) 180 MCG/ACT inhaler       . buPROPion (WELLBUTRIN XL) 150 MG 24 hr tablet Take 150 mg by mouth daily.        . Calcium Carbonate-Vit D-Min 600-400 MG-UNIT TABS 1 tablet by mouth 2 times daily      . Cholecalciferol (VITAMIN D3) 2000 UNITS TABS Take 1 tablet by mouth daily.        . clonazePAM (KLONOPIN) 0.5 MG tablet 1/4 by mouth daily as needed      . estradiol (MENOSTAR) 14 MCG/24HR Place 1 patch onto the skin once a week. Apply the patch to a clean, dry, non-oily skin area of your lower abdomen, hips below the waist, or buttocks that has little or no hair and is free of cuts or irritation.      . fluticasone (FLONASE) 50 MCG/ACT nasal spray USE 2 SPRAYS IN EACH NOSTRIL DAILY  16 g  2  . folic acid (FOLVITE) 1 MG tablet Take 1 mg by mouth 2 (two) times daily.        . Ginkgo Biloba 40 MG TABS Take 1 tablet by mouth 3 (three) times daily. As needed      . Glucosamine HCl 1500 MG TABS Take 1 tablet by mouth daily.        . Guaifenesin (MUCINEX MAXIMUM STRENGTH) 1200 MG TB12 Take 1 tablet by mouth 2 (two) times daily. As needed      . levothyroxine (SYNTHROID, LEVOTHROID) 75 MCG tablet Take 75 mcg by mouth daily. Except Sunday        . levothyroxine (SYNTHROID, LEVOTHROID) 88 MCG tablet Take only  sunday       . LYSINE PO Once daily       . METHOTREXATE SODIUM, PF, IJ Inject 0.8 mg/mL as directed once a week.       . metroNIDAZOLE (METROGEL) 1 % gel Apply topically daily.  45 g  1  . milk thistle 175 MG tablet Take 175 mg by mouth daily.      Marland Kitchen omeprazole-sodium bicarbonate (ZEGERID) 40-1100 MG per capsule Take 1 capsule by mouth daily before breakfast.        . PARoxetine (PAXIL-CR) 12.5 MG 24 hr tablet Take 12.5 mg by mouth daily.       . sodium chloride (BRONCHO SALINE) inhaler solution Saline wash twice daily      . UNABLE TO FIND Take 2 oz by  mouth daily. Med Name: VEMMA (multivitamin vitamin liquid from mangosteen)      . DISCONTD: PULMICORT FLEXHALER 180 MCG/ACT inhaler USE 2 PUFFS TWICE DAILY  1 each  0  . Misc Natural Products (TART CHERRY ADVANCED PO) Take 1 capsule by mouth 2 (two) times daily.

## 2011-08-02 NOTE — Patient Instructions (Addendum)
Increase pulmicort to two puff twice daily for two weeks then reduce to two puff daily No other medication changes Return 3 months

## 2011-08-07 ENCOUNTER — Other Ambulatory Visit: Payer: Self-pay | Admitting: Family Medicine

## 2011-08-13 ENCOUNTER — Telehealth: Payer: Self-pay | Admitting: *Deleted

## 2011-08-13 NOTE — Telephone Encounter (Signed)
She  Can use tyelenol or IBU for pain relief. Rec go to UC to check to make sure not sinus infection, etc.

## 2011-08-13 NOTE — Telephone Encounter (Signed)
Pt informed

## 2011-08-13 NOTE — Telephone Encounter (Signed)
Pt states that she is out of town and states she slept with the air blowing on her apparently and now the right side of her head from abouv her ear to about 3-4 inches up is having sharp, stabbing pains that come and go. States her rt ear feels sensitive and that her scalp on the rt side is sore feeling. States she has been placing a heating pad but hasn't gotten any relief. Please advise. If some thing is sent in, it is to go to the [facility] in [place] listed in chart.

## 2011-08-21 ENCOUNTER — Ambulatory Visit (INDEPENDENT_AMBULATORY_CARE_PROVIDER_SITE_OTHER): Payer: Medicare Other

## 2011-08-21 ENCOUNTER — Ambulatory Visit: Payer: Medicare Other

## 2011-08-21 DIAGNOSIS — R928 Other abnormal and inconclusive findings on diagnostic imaging of breast: Secondary | ICD-10-CM

## 2011-08-21 DIAGNOSIS — Z1231 Encounter for screening mammogram for malignant neoplasm of breast: Secondary | ICD-10-CM

## 2011-08-30 ENCOUNTER — Other Ambulatory Visit: Payer: Self-pay | Admitting: Obstetrics and Gynecology

## 2011-08-30 DIAGNOSIS — R928 Other abnormal and inconclusive findings on diagnostic imaging of breast: Secondary | ICD-10-CM

## 2011-09-04 ENCOUNTER — Ambulatory Visit
Admission: RE | Admit: 2011-09-04 | Discharge: 2011-09-04 | Disposition: A | Discharge status: Home / Self Care | Payer: Medicare Other | Source: Ambulatory Visit | Attending: Obstetrics and Gynecology | Admitting: Obstetrics and Gynecology

## 2011-09-04 ENCOUNTER — Other Ambulatory Visit: Payer: Self-pay | Admitting: Obstetrics and Gynecology

## 2011-09-04 DIAGNOSIS — R928 Other abnormal and inconclusive findings on diagnostic imaging of breast: Secondary | ICD-10-CM

## 2011-09-05 ENCOUNTER — Telehealth: Payer: Self-pay | Admitting: *Deleted

## 2011-09-05 NOTE — Telephone Encounter (Signed)
Pt would like to get a rx for Lidocaine cream or emela cream because she is having a breast bx on friday

## 2011-09-05 NOTE — Telephone Encounter (Signed)
Ok, I entered med.  Didn't press sign yet.  Have her apply at least 30 min before bx. TEll her I hope all goes well.

## 2011-09-06 MED ORDER — LIDOCAINE-PRILOCAINE 2.5-2.5 % EX CREA: TOPICAL_CREAM | CUTANEOUS | Status: DC

## 2011-09-07 ENCOUNTER — Ambulatory Visit
Admission: RE | Admit: 2011-09-07 | Discharge: 2011-09-07 | Disposition: A | Discharge status: Home / Self Care | Payer: Medicare Other | Source: Ambulatory Visit | Attending: Obstetrics and Gynecology | Admitting: Obstetrics and Gynecology

## 2011-09-07 ENCOUNTER — Other Ambulatory Visit: Payer: Self-pay | Admitting: Obstetrics and Gynecology

## 2011-09-07 DIAGNOSIS — R928 Other abnormal and inconclusive findings on diagnostic imaging of breast: Secondary | ICD-10-CM

## 2011-09-07 HISTORY — PX: BREAST BIOPSY: SHX20

## 2011-09-10 ENCOUNTER — Other Ambulatory Visit: Payer: Self-pay | Admitting: Obstetrics and Gynecology

## 2011-09-10 ENCOUNTER — Other Ambulatory Visit: Payer: Medicare Other

## 2011-09-10 DIAGNOSIS — R928 Other abnormal and inconclusive findings on diagnostic imaging of breast: Secondary | ICD-10-CM

## 2011-09-11 ENCOUNTER — Ambulatory Visit
Admission: RE | Admit: 2011-09-11 | Discharge: 2011-09-11 | Disposition: A | Discharge status: Home / Self Care | Payer: Medicare Other | Source: Ambulatory Visit | Attending: Obstetrics and Gynecology | Admitting: Obstetrics and Gynecology

## 2011-09-11 DIAGNOSIS — R928 Other abnormal and inconclusive findings on diagnostic imaging of breast: Secondary | ICD-10-CM

## 2011-09-21 ENCOUNTER — Other Ambulatory Visit: Payer: Self-pay | Admitting: Critical Care Medicine

## 2011-10-04 ENCOUNTER — Ambulatory Visit (INDEPENDENT_AMBULATORY_CARE_PROVIDER_SITE_OTHER): Payer: Medicare Other | Admitting: General Surgery

## 2011-10-04 ENCOUNTER — Other Ambulatory Visit (INDEPENDENT_AMBULATORY_CARE_PROVIDER_SITE_OTHER): Payer: Self-pay | Admitting: General Surgery

## 2011-10-04 ENCOUNTER — Encounter (INDEPENDENT_AMBULATORY_CARE_PROVIDER_SITE_OTHER): Payer: Self-pay | Admitting: General Surgery

## 2011-10-04 VITALS — BP 138/78 | HR 84 | Temp 96.6°F | Resp 20 | Ht 63.0 in | Wt 176.6 lb

## 2011-10-04 DIAGNOSIS — R920 Mammographic microcalcification found on diagnostic imaging of breast: Secondary | ICD-10-CM

## 2011-10-04 NOTE — Patient Instructions (Signed)
Plan for left breast wire localized lumpectomy 

## 2011-10-04 NOTE — Addendum Note (Signed)
Addended by: Caleen Essex III on: 10/04/2011 03:29 PM   Modules accepted: Orders

## 2011-10-04 NOTE — Progress Notes (Signed)
Subjective:     Patient ID: [name], female   DOB: April 26, 1945, 66 y.o.   MRN: [id number]  HPI We're asked to see the patient in consultation by Dr. Whitney Muse to evaluate her for an abnormal mammogram. The patient is a 66 year old white female who recently went for a routine screening mammogram. At that time she was found to have an abnormality in the 12:00 position of the left breast. This was biopsied and came back as a complex sclerosing lesion. She notes occasional shooting pains in both breasts which has been going on for a long time. She denies any discharge from her nipple. She states that she does have fibrocystic disease in her breast tender at times.  Review of Systems  Constitutional: Negative.   HENT: Negative.   Eyes: Negative.   Respiratory: Negative.   Cardiovascular: Negative.   Gastrointestinal: Negative.   Genitourinary: Negative.   Musculoskeletal: Negative.   Skin: Negative.   Neurological: Negative.   Hematological: Negative.   Psychiatric/Behavioral: Negative.        Objective:   Physical Exam  Constitutional: She is oriented to person, place, and time. She appears well-developed and well-nourished.  HENT:  Head: Normocephalic and atraumatic.  Eyes: Conjunctivae and EOM are normal. Pupils are equal, round, and reactive to light.  Neck: Normal range of motion. Neck supple.  Cardiovascular: Normal rate, regular rhythm and normal heart sounds.   Pulmonary/Chest: Effort normal and breath sounds normal.       There is no palpable mass in either breast. No palpable axillary or supraclavicular cervical lymphadenopathy.  Abdominal: Soft. Bowel sounds are normal. She exhibits no mass. There is no tenderness.  Musculoskeletal: Normal range of motion.  Lymphadenopathy:    She has no cervical adenopathy.  Neurological: She is alert and oriented to person, place, and time.  Skin: Skin is warm and dry.  Psychiatric: She has a normal mood and affect. Her behavior is  normal.       Assessment:     The patient has a complex sclerosing lesion in the 12:00 position of the left breast. Because of this carries an increased risk of breast cancer I think it would be reasonable to remove this area to make sure that nothing has been missed. I've discussed with her in detail the risks and benefits of the operation to this as well as some of the technical aspects and she understands and wishes to proceed.    Plan:     Plan for left breast wire localized lumpectomy

## 2011-10-12 ENCOUNTER — Ambulatory Visit
Admission: RE | Admit: 2011-10-12 | Discharge: 2011-10-12 | Disposition: A | Discharge status: Home / Self Care | Payer: Medicare Other | Source: Ambulatory Visit | Attending: General Surgery | Admitting: General Surgery

## 2011-10-12 ENCOUNTER — Other Ambulatory Visit (HOSPITAL_COMMUNITY)
Admission: RE | Admit: 2011-10-12 | Discharge: 2011-10-12 | Disposition: A | Discharge status: Home / Self Care | Payer: Medicare Other | Source: Ambulatory Visit | Attending: General Surgery | Admitting: General Surgery

## 2011-10-12 DIAGNOSIS — C50919 Malignant neoplasm of unspecified site of unspecified female breast: Secondary | ICD-10-CM

## 2011-10-12 DIAGNOSIS — N649 Disorder of breast, unspecified: Secondary | ICD-10-CM | POA: Insufficient documentation

## 2011-10-12 DIAGNOSIS — R920 Mammographic microcalcification found on diagnostic imaging of breast: Secondary | ICD-10-CM

## 2011-10-18 ENCOUNTER — Telehealth (INDEPENDENT_AMBULATORY_CARE_PROVIDER_SITE_OTHER): Payer: Self-pay | Admitting: General Surgery

## 2011-10-18 ENCOUNTER — Telehealth (INDEPENDENT_AMBULATORY_CARE_PROVIDER_SITE_OTHER): Payer: Self-pay

## 2011-10-18 NOTE — Telephone Encounter (Signed)
Patient calling for pathology results, she's very anxious and states she's going out of town and was hoping for her results beforehand.  -  Reviewed pathology report with Dr. Dwain Sarna (Dr. Carolynne Edouard not available) Patient made aware that her pathology results did not show cancer.  However, the report did indicate that High Risk Areas did exist and Dr. Carolynne Edouard will discuss in further detail once reviewed.  Patient has follow up appointment on 10/24/11 @ 1:30 pm w/Dr. Carolynne Edouard.

## 2011-10-18 NOTE — Telephone Encounter (Signed)
Pt calling and very anxious for her path results.  She is aware Dr. Carolynne Edouard is out of the office and is hopeful another MD could review it for him, so the results can be released.

## 2011-10-24 ENCOUNTER — Encounter (INDEPENDENT_AMBULATORY_CARE_PROVIDER_SITE_OTHER): Payer: Medicare Other | Admitting: General Surgery

## 2011-10-26 ENCOUNTER — Encounter (INDEPENDENT_AMBULATORY_CARE_PROVIDER_SITE_OTHER): Payer: Medicare Other | Admitting: General Surgery

## 2011-10-29 ENCOUNTER — Ambulatory Visit (INDEPENDENT_AMBULATORY_CARE_PROVIDER_SITE_OTHER): Payer: Medicare Other | Admitting: General Surgery

## 2011-10-29 ENCOUNTER — Encounter (INDEPENDENT_AMBULATORY_CARE_PROVIDER_SITE_OTHER): Payer: Self-pay | Admitting: General Surgery

## 2011-10-29 VITALS — BP 134/80 | HR 80 | Temp 97.6°F | Resp 16 | Ht 63.0 in | Wt 177.6 lb

## 2011-10-29 DIAGNOSIS — N6089 Other benign mammary dysplasias of unspecified breast: Secondary | ICD-10-CM

## 2011-10-29 DIAGNOSIS — N6099 Unspecified benign mammary dysplasia of unspecified breast: Secondary | ICD-10-CM

## 2011-10-29 NOTE — Progress Notes (Signed)
Subjective:     Patient ID: [name], female   DOB: 1945/07/01, 66 y.o.   MRN: [id number]  HPI The patient is a 66 year old white female who is about 2 weeks status post left breast lumpectomy. Her pathology revealed atypical duct hyperplasia and lobular carcinoma in situ. She denies any breast pain. She does feel some thickness to her scar.  Review of Systems     Objective:   Physical Exam On exam her left breast incision is healing nicely. There is no evidence of infection.    Assessment:     Atypical duct hyperplasia and lobular carcinoma in situ of the left breast    Plan:     Both of these lesions but her in a high risk category. Because of this we will refer her to the high-risk breast clinic. We will plan to see her back in about 3 months for close followup

## 2011-10-29 NOTE — Patient Instructions (Signed)
Will have you follow up in high risk breast clinic Continue regular self exams

## 2011-10-31 ENCOUNTER — Telehealth: Payer: Self-pay | Admitting: *Deleted

## 2011-10-31 NOTE — Telephone Encounter (Signed)
Confirmed 11/16/11 appt w/ pt.  Mailed before letter & packet to pt.  Emailed Musician at [facility] to make aware.  Took paperwork to Med [facility] for chart.

## 2011-11-07 ENCOUNTER — Encounter: Payer: Self-pay | Admitting: Critical Care Medicine

## 2011-11-07 ENCOUNTER — Ambulatory Visit (INDEPENDENT_AMBULATORY_CARE_PROVIDER_SITE_OTHER): Payer: Medicare Other | Admitting: Critical Care Medicine

## 2011-11-07 VITALS — BP 128/74 | HR 78 | Temp 98.7°F | Ht 63.0 in | Wt 178.0 lb

## 2011-11-07 DIAGNOSIS — R748 Abnormal levels of other serum enzymes: Secondary | ICD-10-CM

## 2011-11-07 DIAGNOSIS — J45909 Unspecified asthma, uncomplicated: Secondary | ICD-10-CM

## 2011-11-07 DIAGNOSIS — Z23 Encounter for immunization: Secondary | ICD-10-CM

## 2011-11-07 NOTE — Assessment & Plan Note (Signed)
History of abnormal liver function tests in the past currently normal on 2 determinations in May and August of 2013

## 2011-11-07 NOTE — Assessment & Plan Note (Signed)
Moderate persistent asthma with allergic atopic features stable at this time Plan Maintain inhaled steroids daily Maintain antihistamine and nasal steroid Administer flu vaccine and Pneumovax at this visit

## 2011-11-07 NOTE — Progress Notes (Signed)
Subjective:    Patient ID: [name], female    DOB: 10/28/1945, 66 y.o.   MRN: [id number]  HPI   F/U of   66 y.o.    white female with chronic allergic rhinitis and extrinsic asthma. The patient also has associated vocal cord dysfunction syndrome and reflux disease.     08/02/2011 Now on methotrexate x 10weeks with Azzie Roup.  Now dx with RA.  Has all the symptoms in the hands and feet.  Gets this as an injection once weekly. Pt self injects. Tolerating well so far.   Pt will note some cough and wheezing.  Pt did injure lower back in 4/13 and Rx PT.  11/07/2011 Pt had L breast Bx not CA. 3.5 weeks ago. Will be followed.  Has a dry cough. No dyspnea. No wheeze. Pt notes some pndrip.   Pt denies any significant sore throat, nasal congestion or excess secretions, fever, chills, sweats, unintended weight loss, pleurtic or exertional chest pain, orthopnea PND, or leg swelling Pt denies any increase in rescue therapy over baseline, denies waking up needing it or having any early am or nocturnal exacerbations of coughing/wheezing/or dyspnea. Pt also denies any obvious fluctuation in symptoms with  weather or environmental change or other alleviating or aggravating factors  PUL ASTHMA HISTORY 11/07/2011  Symptoms 0-2 days/week  Nighttime awakenings 0-2/month  Interference with activity No limitations  SABA use 0-2 days/wk  Exacerbations requiring oral steroids 0-1 / year     Past Medical History  Diagnosis Date  . Other and unspecified hyperlipidemia   . Unspecified arthropathy, hand   . Peptic ulcer, unspecified site, unspecified as acute or chronic, without mention of hemorrhage, perforation, or obstruction   . Allergic rhinitis, cause unspecified   . Unspecified asthma   . Benign paroxysmal positional vertigo   . Depressive disorder, not elsewhere classified   . Esophageal reflux   . Unspecified hypothyroidism   . Localized osteoarthrosis not specified whether primary or  secondary, lower leg   . Other diseases of vocal cords      Family History  Problem Relation Age of Onset  . Heart disease Father   . Colon cancer Mother   . Liver cancer Brother   . Asthma Brother      History   Social History  . Marital Status: Married    Spouse Name: N/A    Number of Children: 3  . Years of Education: N/A   Occupational History  . Retired    Social History Main Topics  . Smoking status: Former Smoker -- 0.3 packs/day for 2 years    Types: Cigarettes    Quit date: 02/12/1958  . Smokeless tobacco: Never Used  . Alcohol Use: Yes     wine socially  . Drug Use: No  . Sexually Active: Not on file   Other Topics Concern  . Not on file   Social History Narrative  . No narrative on file     Allergies  Allergen Reactions  . Morphine     REACTION: Nausea  . Moxifloxacin     REACTION: confusion, dizziness, paranoia     Outpatient Prescriptions Prior to Visit  Medication Sig Dispense Refill  . albuterol (PROVENTIL HFA) 108 (90 BASE) MCG/ACT inhaler Inhale 2 puffs into the lungs every 6 (six) hours as needed.        . Ascorbic Acid (VITAMIN C) 500 MG tablet Take 500 mg by mouth 2 (two) times daily.        Marland Kitchen  ASTRAGALUS PO Take 1 capsule by mouth daily.        . Biotin (BIOTIN 5000) 5 MG CAPS Take 1 capsule by mouth daily.        . budesonide (PULMICORT FLEXHALER) 180 MCG/ACT inhaler Inhale 2 puffs into the lungs 2 (two) times daily.       Marland Kitchen buPROPion (WELLBUTRIN XL) 150 MG 24 hr tablet Take 150 mg by mouth daily.        . Calcium Carbonate-Vit D-Min 600-400 MG-UNIT TABS 1 tablet by mouth 2 times daily      . Cholecalciferol (VITAMIN D3) 2000 UNITS TABS Take 1 tablet by mouth daily.        . clonazePAM (KLONOPIN) 0.5 MG tablet 1/4 by mouth daily as needed      . estradiol (MENOSTAR) 14 MCG/24HR Place 1 patch onto the skin once a week. Apply the patch to a clean, dry, non-oily skin area of your lower abdomen, hips below the waist, or buttocks that has  little or no hair and is free of cuts or irritation.      . fluticasone (FLONASE) 50 MCG/ACT nasal spray USE 2 SPRAYS IN EACH NOSTRIL DAILY  16 g  2  . folic acid (FOLVITE) 1 MG tablet Take 1 mg by mouth 2 (two) times daily.        . Ginkgo Biloba 40 MG TABS Take 1 tablet by mouth 3 (three) times daily. As needed      . Glucosamine HCl 1500 MG TABS Take 1 tablet by mouth daily.        . Guaifenesin (MUCINEX MAXIMUM STRENGTH) 1200 MG TB12 Take 1 tablet by mouth 2 (two) times daily. As needed      . levothyroxine (SYNTHROID, LEVOTHROID) 75 MCG tablet Take 75 mcg by mouth daily. Except Sunday        . levothyroxine (SYNTHROID, LEVOTHROID) 88 MCG tablet Take only sunday       . lidocaine-prilocaine (EMLA) cream Apply 30 min to skin prior to surgery  30 g  0  . LYSINE PO Once daily       . METHOTREXATE SODIUM, PF, IJ Inject 0.8 mg/mL as directed once a week.       Marland Kitchen METROGEL 1 % gel APPLY EVERY DAY  45 g  1  . milk thistle 175 MG tablet Take 175 mg by mouth daily.      . Misc Natural Products (TART CHERRY ADVANCED PO) Take 1 capsule by mouth 2 (two) times daily.      Marland Kitchen omeprazole-sodium bicarbonate (ZEGERID) 40-1100 MG per capsule Take 1 capsule by mouth daily before breakfast.        . PARoxetine (PAXIL-CR) 12.5 MG 24 hr tablet Take 12.5 mg by mouth 2 (two) times daily.       . sodium chloride (BRONCHO SALINE) inhaler solution Saline wash twice daily      . UNABLE TO FIND Take 2 oz by mouth daily. Med Name: VEMMA (multivitamin vitamin liquid from mangosteen)      . PULMICORT FLEXHALER 180 MCG/ACT inhaler INHALE 2 PUFFS BY MOUTH TWICE A DAY  1 each  0     Review of Systems  Constitutional:   No  weight loss, night sweats,  Fevers, chills, fatigue, lassitude. HEENT:   No headaches,  Difficulty swallowing,  Tooth/dental problems   CV:  No chest pain,  Orthopnea, PND, swelling in lower extremities, anasarca, dizziness, palpitations  GI  No heartburn, indigestion, abdominal pain, nausea,  vomiting, diarrhea, change in bowel habits, loss of appetite  Resp:  .  No excess mucus   No coughing up of blood.  No change in color of mucus.    No chest wall deformity  Skin: no rash or lesions.  GU: no dysuria, change in color of urine, no urgency or frequency.  No flank pain.  MS:  No joint pain or swelling.  No decreased range of motion.  No back pain.  Psych:  No change in mood or affect. No depression or anxiety.  No memory loss.     Objective:   Physical Exam BP 128/74  Pulse 78  Temp 98.7 F (37.1 C) (Oral)  Ht 5\' 3"  (1.6 m)  Wt 178 lb (80.74 kg)  BMI 31.53 kg/m2  SpO2 97%  Gen: Pleasant, well-nourished, in no distress,  normal affect  ENT: NM pale with clear mucus, no  post nasal drip  Neck: No JVD, no TMG, no carotid bruits  Lungs: No use of accessory muscles, no dullness to percussion, no wheeze    Cardiovascular: RRR, heart sounds normal, no murmur or gallops, no peripheral edema  Abdomen: soft and NT, no HSM,  BS normal  Musculoskeletal: No deformities, no cyanosis or clubbing  Neuro: alert, non focal  Skin: Warm, no lesions or rashes             Assessment & Plan:   Abnormal liver enzymes History of abnormal liver function tests in the past currently normal on 2 determinations in May and August of 2013  Asthma with allergic rhinitis Moderate persistent asthma with allergic atopic features stable at this time Plan Maintain inhaled steroids daily Maintain antihistamine and nasal steroid Administer flu vaccine and Pneumovax at this visit    Updated Medication List Outpatient Encounter Prescriptions as of 11/07/2011  Medication Sig Dispense Refill  . albuterol (PROVENTIL HFA) 108 (90 BASE) MCG/ACT inhaler Inhale 2 puffs into the lungs every 6 (six) hours as needed.        . Ascorbic Acid (VITAMIN C) 500 MG tablet Take 500 mg by mouth 2 (two) times daily.        . ASTRAGALUS PO Take 1 capsule by mouth daily.        . Biotin (BIOTIN  5000) 5 MG CAPS Take 1 capsule by mouth daily.        . budesonide (PULMICORT FLEXHALER) 180 MCG/ACT inhaler Inhale 2 puffs into the lungs 2 (two) times daily.       Marland Kitchen buPROPion (WELLBUTRIN XL) 150 MG 24 hr tablet Take 150 mg by mouth daily.        . Calcium Carbonate-Vit D-Min 600-400 MG-UNIT TABS 1 tablet by mouth 2 times daily      . Cholecalciferol (VITAMIN D3) 2000 UNITS TABS Take 1 tablet by mouth daily.        . clonazePAM (KLONOPIN) 0.5 MG tablet 1/4 by mouth daily as needed      . estradiol (MENOSTAR) 14 MCG/24HR Place 1 patch onto the skin once a week. Apply the patch to a clean, dry, non-oily skin area of your lower abdomen, hips below the waist, or buttocks that has little or no hair and is free of cuts or irritation.      . fexofenadine (ALLEGRA) 180 MG tablet Take 180 mg by mouth at bedtime.      . fluticasone (FLONASE) 50 MCG/ACT nasal spray USE 2 SPRAYS IN EACH NOSTRIL DAILY  16 g  2  . folic acid (  FOLVITE) 1 MG tablet Take 1 mg by mouth 2 (two) times daily.        . Ginkgo Biloba 40 MG TABS Take 1 tablet by mouth 3 (three) times daily. As needed      . Glucosamine HCl 1500 MG TABS Take 1 tablet by mouth daily.        . Guaifenesin (MUCINEX MAXIMUM STRENGTH) 1200 MG TB12 Take 1 tablet by mouth 2 (two) times daily. As needed      . levothyroxine (SYNTHROID, LEVOTHROID) 75 MCG tablet Take 75 mcg by mouth daily. Except Sunday        . levothyroxine (SYNTHROID, LEVOTHROID) 88 MCG tablet Take only sunday       . lidocaine-prilocaine (EMLA) cream Apply 30 min to skin prior to surgery  30 g  0  . LYSINE PO Once daily       . METHOTREXATE SODIUM, PF, IJ Inject 0.8 mg/mL as directed once a week.       Marland Kitchen METROGEL 1 % gel APPLY EVERY DAY  45 g  1  . milk thistle 175 MG tablet Take 175 mg by mouth daily.      . Misc Natural Products (TART CHERRY ADVANCED PO) Take 1 capsule by mouth 2 (two) times daily.      Marland Kitchen omeprazole-sodium bicarbonate (ZEGERID) 40-1100 MG per capsule Take 1 capsule by  mouth daily before breakfast.        . PARoxetine (PAXIL-CR) 12.5 MG 24 hr tablet Take 12.5 mg by mouth 2 (two) times daily.       . sodium chloride (BRONCHO SALINE) inhaler solution Saline wash twice daily      . UNABLE TO FIND Take 2 oz by mouth daily. Med Name: VEMMA (multivitamin vitamin liquid from mangosteen)      . DISCONTD: PULMICORT FLEXHALER 180 MCG/ACT inhaler INHALE 2 PUFFS BY MOUTH TWICE A DAY  1 each  0

## 2011-11-07 NOTE — Patient Instructions (Addendum)
No change in medications. Return in           4 months     Flu vaccine and pneumovax given

## 2011-11-15 ENCOUNTER — Other Ambulatory Visit: Payer: Self-pay | Admitting: *Deleted

## 2011-11-15 DIAGNOSIS — D0500 Lobular carcinoma in situ of unspecified breast: Secondary | ICD-10-CM

## 2011-11-16 ENCOUNTER — Ambulatory Visit: Payer: Medicare Other

## 2011-11-16 ENCOUNTER — Encounter: Payer: Self-pay | Admitting: Oncology

## 2011-11-16 ENCOUNTER — Other Ambulatory Visit: Payer: Self-pay | Admitting: Emergency Medicine

## 2011-11-16 ENCOUNTER — Telehealth: Payer: Self-pay | Admitting: *Deleted

## 2011-11-16 ENCOUNTER — Ambulatory Visit (HOSPITAL_BASED_OUTPATIENT_CLINIC_OR_DEPARTMENT_OTHER): Payer: Medicare Other | Admitting: Oncology

## 2011-11-16 ENCOUNTER — Other Ambulatory Visit (HOSPITAL_BASED_OUTPATIENT_CLINIC_OR_DEPARTMENT_OTHER): Payer: Medicare Other | Admitting: Lab

## 2011-11-16 VITALS — BP 135/83 | HR 78 | Temp 98.5°F | Resp 18 | Ht 63.0 in | Wt 176.0 lb

## 2011-11-16 DIAGNOSIS — D0590 Unspecified type of carcinoma in situ of unspecified breast: Secondary | ICD-10-CM

## 2011-11-16 DIAGNOSIS — D0500 Lobular carcinoma in situ of unspecified breast: Secondary | ICD-10-CM

## 2011-11-16 DIAGNOSIS — N6099 Unspecified benign mammary dysplasia of unspecified breast: Secondary | ICD-10-CM

## 2011-11-16 LAB — CBC WITH DIFFERENTIAL/PLATELET
BASO%: 0.9 % (ref 0.0–2.0)
Eosinophils Absolute: 0.4 10*3/uL (ref 0.0–0.5)
LYMPH%: 20.5 % (ref 14.0–49.7)
MCHC: 33.3 g/dL (ref 31.5–36.0)
MONO#: 0.8 10*3/uL (ref 0.1–0.9)
MONO%: 10.4 % (ref 0.0–14.0)
NEUT#: 5 10*3/uL (ref 1.5–6.5)
Platelets: 332 10*3/uL (ref 145–400)
RBC: 4.39 10*6/uL (ref 3.70–5.45)
RDW: 13.4 % (ref 11.2–14.5)
WBC: 7.9 10*3/uL (ref 3.9–10.3)

## 2011-11-16 LAB — COMPREHENSIVE METABOLIC PANEL (CC13)
ALT: 29 U/L (ref 0–55)
Albumin: 3.4 g/dL — ABNORMAL LOW (ref 3.5–5.0)
Alkaline Phosphatase: 74 U/L (ref 40–150)
Glucose: 101 mg/dl — ABNORMAL HIGH (ref 70–99)
Potassium: 4.2 mEq/L (ref 3.5–5.1)
Sodium: 139 mEq/L (ref 136–145)
Total Bilirubin: 0.3 mg/dL (ref 0.20–1.20)
Total Protein: 6.4 g/dL (ref 6.4–8.3)

## 2011-11-16 MED ORDER — RALOXIFENE HCL 60 MG PO TABS: 60.0000 mg | ORAL_TABLET | Freq: Every day | ORAL | Status: DC

## 2011-11-16 NOTE — Progress Notes (Signed)
[facility]  High Risk Clinic New Patient Evaluation  Name: [name]            Date: 11/16/2011 MRN: [id number]                DOB: 10/21/1945  CC:  Dr. Nani Gasser  REFERRING PHYSICIAN: Dr. Chevis Pretty  REASON FOR VISIT: 66 year old female with atypical ductal hyperplasia of the left breast status post lumpectomy being seen for discussion of reducing risk of developing breast cancer.  HISTORY OF PRESENT ILLNESS: [name] is a 66 y.o. female with medical history significant for asthma and rhinitis. Patient was referred to me by Dr. Carolynne Edouard who recently saw her after patient presented with an abnormal mammogram in her left breast. She went on to have a left breast lumpectomy that showed a atypical ductal hyperplasia and lobular carcinoma in situ. Postoperatively patient has done well and she is now referred to the high risk clinic for discussion of risk reduction of breast cancer. Clinically patient is doing well without any problem she denies any fevers chills night sweats headaches no breast pains. She has no significant family history of breast cancer.    PAST MEDICAL HISTORY:  has a past medical history of Other and unspecified hyperlipidemia; Unspecified arthropathy, hand; Peptic ulcer, unspecified site, unspecified as acute or chronic, without mention of hemorrhage, perforation, or obstruction; Allergic rhinitis, cause unspecified; Unspecified asthma; Benign paroxysmal positional vertigo; Depressive disorder, not elsewhere classified; Esophageal reflux; Unspecified hypothyroidism; Localized osteoarthrosis not specified whether primary or secondary, lower leg; and Other diseases of vocal cords.  PAST SURGICAL HISTORY:  Past Surgical History  Procedure Date  . Nasal sinus surgery   . Knee arthroscopy   . Rotator cuff repair       CURRENT MEDICATIONS: Ms. Kaczynski does not currently have medications on file.  ALLERGIES: Morphine and  Moxifloxacin  SOCIAL HISTORY:  reports that she quit smoking about 53 years ago. Her smoking use included Cigarettes. She has a .6 pack-year smoking history. She has never used smokeless tobacco. She reports that she drinks alcohol. She reports that she does not use illicit drugs.  HEALTH HABITS: Vitamins:yes Supplements:yes Alternative Therapies:anti-inflammatory Adverse environmental exposure:none Servings of fruit and vegetables/day:2-3 Servings of meat/day:1 Exercises regularly:    None 2 days a week for 1/2 hour              Smoker/nonsmoker: non-smoker Alcohol:1 glass a week Number of alcoholic beverages/week: 1 glass of wine/week  REPRODUCTIVE HISTORY:  Menarche age: 27 Gravida: 3      Para:3 First Live Birth:24 Number of live births:3 Breast fed: yes the last child for <1 month Took fertility meds:  none                   Type:  Menses: post-menopausal Oral Contraceptives:   15months       Menopause: natural/surgical   Natural at 24 HRT Yes Currently Yes TType:   Estrogen only For 13 years Sexually transmitted disease:  no  FAMILY HISTORY:  family history includes Asthma in her brother; Colon cancer in her mother; Heart disease in her father; and Liver cancer in her brother.  HEALTH MAINTENANCE: Last mammogram: June 2013 Last clinical breast exam: June 2013 Performs self breast exam: no Last Pap Smear:11/15/11 Colonoscopy: 7 years ago ([facility]) Last skin exam:yearly  REVIEW OF SYSTEMS:  General: Negative for fever, chills, night sweats,  loss of appetite or weight loss. HEENT: Negative for headaches,  sore  throat, difficulty swallowing, blurred vision or problem with hearing or  sinus congestion. Respiratory: Negative for shortness of breath, cough  or dyspnea on exertion. Cardiovascular: Negative for chest pain,  palpitations or pedal edema. GI: Negative for nausea, vomiting,  diarrhea, constipation, change in bowel habits or blood in the stool.  No jaundice. GU:  Negative for painful or frequent urination, change in  color of urine, or decreased urinary stream. Integumentary: Negative  for skin rashes or other suspicious skin lesions. Hematologic: Negative  for easy bruisability or bleeding. Musculoskeletal: Negative for  complaints of pain, arthralgias, arthritis or myalgias.  Neurological/psychiatric: Negative for numbness, focal weakness,  balance problems or coordination difficulties. No depression or mood swings.  Breast: No self detected abnormalities in the breast. No nipple discharge, masses or redness of the skin.   PHYSICAL EXAM: BP 135/83  Pulse 78  Temp 98.5 F (36.9 C) (Oral)  Resp 18  Ht 5\' 3"  (1.6 m)  Wt 176 lb (79.833 kg)  BMI 31.18 kg/m2 GENERAL: Well developed, well nourished, in no acute distress.  EENT: No ocular or oral lesions. No stomatitis.  RESPIRATORY: Lungs are clear to auscultation bilaterally with normal respiratory movement and no accessory muscle use. CARDIAC: No murmur, rub or tachycardia. No upper or lower extremity edema.  GI: Abdomen is soft, no palpable hepatosplenomegaly. No fluid wave. No tenderness. Musculoskeletal: No kyphosis, no tenderness over the spine, ribs or hips. Lymph: No cervical, infraclavicular, axillary or inguinal adenopathy. Neuro: No focal neurological deficits. Psych: Alert and oriented X 3, appropriate mood and affect.  BREAST EXAM: In the supine position, with the right arm over the head, right nipple is everted. No periareolar edema or nipple discharge. No mass in any quadrant or subareolar region. No redness of the skin. No right axillary adenopathy. With the left arm over the head, left nipple is everted. No periareolar edema or nipple discharge. No mass in any quadrant or subareolar region. No redness of the skin. No left axillary adenopathy. Healing incisional scar in the right breast   ASSESSMENT: 66 year old female with atypical ductal hyperplasia and lobular carcinoma in situ  recently on a mammogram and she has undergone a lumpectomy. She had not had any other history of breast cancers or any other malignancies. We reviewed her pathology today I also discussed high-risk lesions. She understands that this is not a cancer but she is at higher risk for developing breast cancer. We discussed risk reducing strategies. This would certainly include surveillance as well as chemoprevention. We discussed chemoprevention with tamoxifen as well as raloxifene. Risks and benefits of both drugs were discussed with her. Patient is quite concerned about her risk of developing breast cancer she proceed service to be about 80-90%. However we discussed that her risk of developing a subsequent malignancy most likely is in the 20-30% range. Therefore she would be a good candidate for chemoprevention. She did not like the side effects of tamoxifen since she does have a history of strokes and blood clots in the family. They we therefore extensively discussed raloxifene as an alternative. She was agreeable to that. A prescription for raloxifene was given to her. We also discussed other ways.she may be able to reduce her risk which would be change in modification in her diet to include more fruits and vegetables increasing her exercise to at least 6 days a week including aerobic exercise as well as weight-bearing exercises. Reducing consumption of fatty foods as well as reduction in the alcohol consumption.  We discussed her continuing mammograms on a yearly basis. We also discussed self breast examinations to be performed on a monthly basis. She should also get clinical examination every 6 months.  PLAN:  #1 begin raloxifene 60 mg on a daily basis.  #2 she will be seen back in 3-6 months time in followup.  #3 self breast examinations on a monthly basis clinical examination every 6 months.  #3 continuing exercise increasing it to 6 days a week with aerobic as well as weight-bearing exercises.  #4  consuming more fruits and vegetables and reducing red meat and fat.  Drue Second, MD Medical/Oncology [facility] [phone] (beeper) [phone] (Office)

## 2011-11-16 NOTE — Patient Instructions (Addendum)
We discussed your pathology today  We discussed how to prevent breast cancer  You will begin evista 60 mg daily as prevention for breast cancer  We discussed you stopping the estrogen patch  Raloxifene tablets What is this medicine? RALOXIFENE (ral OX i feen) reduces the amount of calcium lost from bones. It is used to treat and prevent osteoporosis in women who have experienced menopause. This medicine may be used for other purposes; ask your health care provider or pharmacist if you have questions. What should I tell my health care provider before I take this medicine? They need to know if you have any of these conditions: -a history of blood clots -cancer -heart failure -liver disease -premenopausal -an unusual or allergic reaction to raloxifene, other medicines, foods, dyes, or preservatives -pregnant or trying to get pregnant -breast-feeding How should I use this medicine? Take this medicine by mouth with a glass of water. Follow the directions on the prescription label. The tablets can be taken with or without food. Take your doses at regular intervals. Do not take your medicine more often than directed. Talk to your pediatrician regarding the use of this medicine in children. Special care may be needed. Overdosage: If you think you have taken too much of this medicine contact a poison control center or emergency room at once. NOTE: This medicine is only for you. Do not share this medicine with others. What if I miss a dose? If you miss a dose, take it as soon as you can. If it is almost time for your next dose, take only that dose. Do not take double or extra doses. What may interact with this medicine? -ampicillin -cholestyramine -colestipol -diazepam -diazoxide -female hormones like hormone replacement therapy -lidocaine -warfarin This list may not describe all possible interactions. Give your health care provider a list of all the medicines, herbs, non-prescription  drugs, or dietary supplements you use. Also tell them if you smoke, drink alcohol, or use illegal drugs. Some items may interact with your medicine. What should I watch for while using this medicine? Visit your doctor or health care professional for regular checks on your progress. Do not stop taking this medicine except on the advice of your doctor or health care professional. You should make sure you get enough calcium and vitamin D in your diet while you are taking this medicine. Discuss your dietary needs with your health care professional or nutritionist. Exercise may help to prevent bone loss. Discuss your exercise needs with your doctor or health care professional. This medicine can rarely cause blood clots. You should avoid long periods of bed rest while taking this medicine. If you are going to have surgery, tell your doctor or health care professional that you are taking this medicine. This medicine should be stopped at least 3 days before surgery. After surgery, it should be restarted only after you are walking again. It should not be restarted while you still need long periods of bed rest. You should not smoke while taking this medicine. Smoking may also increase your risk of blood clots. Smoking can also decrease the effects of this medicine. This medicine does not prevent hot flashes. It may cause hot flashes in some patients at the start of therapy. What side effects may I notice from receiving this medicine? Side effects that you should report to your doctor or health care professional as soon as possible: -change in vision -chest pain -difficulty breathing -leg pain or swelling -skin rash, itching Side effects that  usually do not require medical attention (report to your doctor or health care professional if they continue or are bothersome): -fluid build-up -leg cramps -stomach pain -sweating This list may not describe all possible side effects. Call your doctor for medical  advice about side effects. You may report side effects to FDA at 1-800-FDA-1088. Where should I keep my medicine? Keep out of the reach of children. Store at room temperature between 15 and 30 degrees C (59 and 86 degrees F). Throw away any unused medicine after the expiration date. NOTE: This sheet is a summary. It may not cover all possible information. If you have questions about this medicine, talk to your doctor, pharmacist, or health care provider.  2012, [employer]vier/Gold Standard. (01/15/2008 3:15:14 PM)   I will see you back in 3 months for follow up

## 2011-11-16 NOTE — Telephone Encounter (Signed)
Gave patient appointment for 02-2012 

## 2011-12-10 ENCOUNTER — Ambulatory Visit (INDEPENDENT_AMBULATORY_CARE_PROVIDER_SITE_OTHER): Payer: BC Managed Care – PPO | Admitting: Physician Assistant

## 2011-12-10 ENCOUNTER — Encounter: Payer: Self-pay | Admitting: Physician Assistant

## 2011-12-10 VITALS — BP 134/74 | HR 79 | Temp 98.1°F | Wt 173.0 lb

## 2011-12-10 DIAGNOSIS — J3489 Other specified disorders of nose and nasal sinuses: Secondary | ICD-10-CM

## 2011-12-10 DIAGNOSIS — J329 Chronic sinusitis, unspecified: Secondary | ICD-10-CM

## 2011-12-10 MED ORDER — AMOXICILLIN-POT CLAVULANATE 875-125 MG PO TABS: 1.0000 | ORAL_TABLET | Freq: Two times a day (BID) | ORAL | Status: DC

## 2011-12-10 NOTE — Patient Instructions (Addendum)

## 2011-12-10 NOTE — Progress Notes (Signed)
  Subjective:    Patient ID: [name], female    DOB: 31-May-1945, 66 y.o.   MRN: [id number]  HPI Patient presents to the clinic with 2 weeks of sinus pressure, congestion, teeth hurting. She has felt warm but has not ran a fever. She has a history of sinusitis. She is on methotrexate. She has been given prednisone in the past and always made her sickness worse. She has been on Mucinex DM, flonase, and nasal saline. She has been taking all of her vitamins to help increase her immune system. She has a dry cough. She denies any SOB, palpitations, CP. She has felt chilly.    Review of Systems  All other systems reviewed and are negative.       Objective:   Physical Exam  Constitutional: She is oriented to person, place, and time. She appears well-developed and well-nourished.  HENT:  Head: Normocephalic and atraumatic.       TM's clear with no blood or pus present. Good light reflex.   Left nare open sore present on turbinate very inflammed bilaterally.   Tenderness to maxillary palpitations bilaterally.   Eyes: Conjunctivae normal are normal.  Neck: Normal range of motion. Neck supple.  Cardiovascular: Normal rate, regular rhythm and normal heart sounds.   Pulmonary/Chest: Effort normal and breath sounds normal. She has no wheezes.  Lymphadenopathy:    She has no cervical adenopathy.  Neurological: She is alert and oriented to person, place, and time.  Skin: Skin is warm and dry.  Psychiatric: She has a normal mood and affect. Her behavior is normal.          Assessment & Plan:  Sinusitis/nasal sore- Patient has had good outcome with Augmentin in the past. Will try for 10 days. Encouraged patient to continue all symptomatic care. She did have a lesion in the left nostril recommended her to stop nasal steroid until heals and continue with nasal saline. If cough continues call office and could give some cough rx. I suspect cough is due to post nasal drip. Continue with  inhalers.

## 2011-12-17 ENCOUNTER — Encounter: Payer: Self-pay | Admitting: *Deleted

## 2011-12-17 NOTE — Progress Notes (Signed)
Mailed after appt letter to pt. 

## 2012-01-06 ENCOUNTER — Other Ambulatory Visit: Payer: Self-pay | Admitting: Critical Care Medicine

## 2012-01-21 ENCOUNTER — Telehealth (INDEPENDENT_AMBULATORY_CARE_PROVIDER_SITE_OTHER): Payer: Self-pay

## 2012-01-21 NOTE — Telephone Encounter (Signed)
Patient reports her incision is healing well.  Patient denies having any redness, swelling or drainage on her incision.  Patient advised to call our office if she has any questions or concerns.  Patient has follow up appointment on 02/21/12 @ 2:30 pm w/Dr. Carolynne Edouard (patient req'd Thursday appointment)

## 2012-01-21 NOTE — Telephone Encounter (Signed)
Patient rescheduled due to surgery case add-on.  Patient was offered appointment for same day 01/30/12 @ 3:40 pm but, requested a Thursday appointment.  Appointment scheduled for Thursday 02/21/12 @ 2:30 pm.

## 2012-01-29 ENCOUNTER — Encounter (INDEPENDENT_AMBULATORY_CARE_PROVIDER_SITE_OTHER): Payer: Medicare Other | Admitting: General Surgery

## 2012-01-30 ENCOUNTER — Encounter (INDEPENDENT_AMBULATORY_CARE_PROVIDER_SITE_OTHER): Payer: Medicare Other | Admitting: General Surgery

## 2012-02-21 ENCOUNTER — Encounter (INDEPENDENT_AMBULATORY_CARE_PROVIDER_SITE_OTHER): Payer: Self-pay | Admitting: General Surgery

## 2012-02-21 ENCOUNTER — Ambulatory Visit (INDEPENDENT_AMBULATORY_CARE_PROVIDER_SITE_OTHER): Payer: Medicare Other | Admitting: General Surgery

## 2012-02-21 VITALS — BP 130/82 | HR 80 | Temp 98.1°F | Resp 16 | Ht 63.0 in | Wt 177.4 lb

## 2012-02-21 DIAGNOSIS — N6099 Unspecified benign mammary dysplasia of unspecified breast: Secondary | ICD-10-CM

## 2012-02-21 DIAGNOSIS — N6089 Other benign mammary dysplasias of unspecified breast: Secondary | ICD-10-CM

## 2012-02-21 NOTE — Patient Instructions (Addendum)
Continue regular self exams  

## 2012-02-21 NOTE — Progress Notes (Signed)
Subjective:     Patient ID: [name], female   DOB: April 03, 1945, 67 y.o.   MRN: [id number]  HPI The patient is a 67 year old white female who is 4 months status post left breast lumpectomy for LCIS. She met with the oncologists at the high risk clinic who started her on raloxifene. She has been reluctant to take the medicine because she is worried about her liver functions since she also has to take methotrexate. She is planning to meet with medical oncology again in the next couple weeks to talk about this. Otherwise she has been well. She denies any discharge from her nipple. She does occasionally get some sharp shooting pains in the left breast which come and go.  Review of Systems  Constitutional: Negative.   HENT: Negative.   Eyes: Negative.   Respiratory: Negative.   Cardiovascular: Negative.   Gastrointestinal: Negative.   Genitourinary: Negative.   Musculoskeletal: Negative.   Skin: Negative.   Neurological: Negative.   Hematological: Negative.   Psychiatric/Behavioral: Negative.        Objective:   Physical Exam  Constitutional: She is oriented to person, place, and time. She appears well-developed and well-nourished.  HENT:  Head: Normocephalic and atraumatic.  Eyes: Conjunctivae normal and EOM are normal. Pupils are equal, round, and reactive to light.  Neck: Normal range of motion. Neck supple.  Cardiovascular: Normal rate, regular rhythm and normal heart sounds.   Pulmonary/Chest: Effort normal and breath sounds normal.       There is no palpable mass in either breast. There is no palpable axillary or supraclavicular cervical lymphadenopathy.  Abdominal: Soft. Bowel sounds are normal. She exhibits no mass. There is no tenderness.  Musculoskeletal: Normal range of motion.  Lymphadenopathy:    She has no cervical adenopathy.  Neurological: She is alert and oriented to person, place, and time.  Skin: Skin is warm and dry.  Psychiatric: She has a normal mood and  affect. Her behavior is normal.       Assessment:     The patient is 4 months status post left breast lumpectomy for LCIS    Plan:     At this point she will continue to do regular self exams. We will plan to see her back in about 6 months. She is due for her next mammogram in June. She will also made with the medical oncologist to decide whether to take raloxifene or not

## 2012-02-22 ENCOUNTER — Other Ambulatory Visit: Payer: Medicare Other | Admitting: Lab

## 2012-02-22 ENCOUNTER — Ambulatory Visit: Payer: Medicare Other | Admitting: Oncology

## 2012-03-01 ENCOUNTER — Other Ambulatory Visit: Payer: Self-pay | Admitting: Critical Care Medicine

## 2012-03-05 ENCOUNTER — Other Ambulatory Visit: Payer: Self-pay | Admitting: Lab

## 2012-03-05 ENCOUNTER — Ambulatory Visit: Payer: BC Managed Care – PPO | Admitting: Oncology

## 2012-03-11 ENCOUNTER — Ambulatory Visit: Payer: BC Managed Care – PPO | Admitting: Critical Care Medicine

## 2012-03-12 ENCOUNTER — Ambulatory Visit: Payer: BC Managed Care – PPO | Admitting: Critical Care Medicine

## 2012-04-16 ENCOUNTER — Other Ambulatory Visit: Payer: Self-pay | Admitting: Medical Oncology

## 2012-04-16 DIAGNOSIS — N6099 Unspecified benign mammary dysplasia of unspecified breast: Secondary | ICD-10-CM

## 2012-04-17 ENCOUNTER — Ambulatory Visit (HOSPITAL_BASED_OUTPATIENT_CLINIC_OR_DEPARTMENT_OTHER): Payer: Medicare Other | Admitting: Oncology

## 2012-04-17 ENCOUNTER — Other Ambulatory Visit (HOSPITAL_BASED_OUTPATIENT_CLINIC_OR_DEPARTMENT_OTHER): Payer: Medicare Other | Admitting: Lab

## 2012-04-17 ENCOUNTER — Telehealth: Payer: Self-pay | Admitting: Oncology

## 2012-04-17 ENCOUNTER — Encounter: Payer: Self-pay | Admitting: Oncology

## 2012-04-17 VITALS — BP 157/80 | HR 87 | Temp 97.3°F | Resp 20 | Ht 63.0 in | Wt 178.7 lb

## 2012-04-17 DIAGNOSIS — D0590 Unspecified type of carcinoma in situ of unspecified breast: Secondary | ICD-10-CM

## 2012-04-17 DIAGNOSIS — N6089 Other benign mammary dysplasias of unspecified breast: Secondary | ICD-10-CM

## 2012-04-17 DIAGNOSIS — N6099 Unspecified benign mammary dysplasia of unspecified breast: Secondary | ICD-10-CM

## 2012-04-17 LAB — CBC WITH DIFFERENTIAL/PLATELET
BASO%: 1 % (ref 0.0–2.0)
EOS%: 7 % (ref 0.0–7.0)
HGB: 13 g/dL (ref 11.6–15.9)
MCH: 29.6 pg (ref 25.1–34.0)
MCHC: 33.5 g/dL (ref 31.5–36.0)
MONO#: 0.5 10*3/uL (ref 0.1–0.9)
RDW: 14.3 % (ref 11.2–14.5)
WBC: 7.3 10*3/uL (ref 3.9–10.3)
lymph#: 1.6 10*3/uL (ref 0.9–3.3)

## 2012-04-17 LAB — COMPREHENSIVE METABOLIC PANEL (CC13)
ALT: 40 U/L (ref 0–55)
AST: 18 U/L (ref 5–34)
Albumin: 3.4 g/dL — ABNORMAL LOW (ref 3.5–5.0)
Calcium: 9.2 mg/dL (ref 8.4–10.4)
Chloride: 106 mEq/L (ref 98–107)
Potassium: 3.8 mEq/L (ref 3.5–5.1)
Sodium: 143 mEq/L (ref 136–145)
Total Protein: 6.6 g/dL (ref 6.4–8.3)

## 2012-04-17 NOTE — Telephone Encounter (Signed)
gv pt appt schedule for March 2015.  °

## 2012-04-25 ENCOUNTER — Ambulatory Visit: Payer: Medicare Other | Admitting: Family Medicine

## 2012-04-28 NOTE — Progress Notes (Signed)
OFFICE PROGRESS NOTE  CC  METHENEY,CATHERINE, MD [address] [place] [address]  DIAGNOSIS: 67 year old female with atypical ductal hyperplasia of the left breast status post lumpectomy in September 2013.  PRIOR THERAPY:  #1 patient presented with an abnormal mammogram and left breast. She had a left breast lumpectomy that showed atypical ductal hyperplasia and lobular carcinoma in situ. She was subsequently seen in the high-risk clinic.  #2 recommendation for start of Evista 60 mg daily was made. However up-to-date patient has not started it. She is just very afraid of side effects.  CURRENT THERAPY:patient was recommended Evista 60 mg daily but she has not started it.  INTERVAL HISTORY: [name] 67 y.o. female returns for followup visit at 5 months time. She today tells me that she did not start the Evista at all as she is afraid of the side effects that she has read about. Clinically otherwise she is doing well and has no complaints.  MEDICAL HISTORY: Past Medical History  Diagnosis Date  . Other and unspecified hyperlipidemia   . Unspecified arthropathy, hand   . Peptic ulcer, unspecified site, unspecified as acute or chronic, without mention of hemorrhage, perforation, or obstruction   . Allergic rhinitis, cause unspecified   . Unspecified asthma   . Benign paroxysmal positional vertigo   . Depressive disorder, not elsewhere classified   . Esophageal reflux   . Unspecified hypothyroidism   . Localized osteoarthrosis not specified whether primary or secondary, lower leg   . Other diseases of vocal cords     ALLERGIES:  is allergic to morphine and moxifloxacin.  MEDICATIONS:  Current Outpatient Prescriptions  Medication Sig Dispense Refill  . albuterol (PROVENTIL HFA) 108 (90 BASE) MCG/ACT inhaler Inhale 2 puffs into the lungs every 6 (six) hours as needed.        . Ascorbic Acid (VITAMIN C) 500 MG tablet Take 500 mg by mouth daily.        . Biotin (BIOTIN 5000) 5 MG CAPS Take 1 capsule by mouth daily.        . budesonide (PULMICORT FLEXHALER) 180 MCG/ACT inhaler Inhale 2 puffs into the lungs 2 (two) times daily.       Marland Kitchen buPROPion (WELLBUTRIN XL) 150 MG 24 hr tablet Take 150 mg by mouth daily.        . Calcium Carbonate-Vit D-Min 600-400 MG-UNIT TABS 1 tablet by mouth 2 times daily      . Cholecalciferol (VITAMIN D3) 2000 UNITS TABS Take 1 tablet by mouth daily.        . clonazePAM (KLONOPIN) 0.5 MG tablet 1/4 by mouth daily as needed      . fexofenadine (ALLEGRA) 180 MG tablet Take 180 mg by mouth at bedtime.      . folic acid (FOLVITE) 1 MG tablet Take 1 mg by mouth 2 (two) times daily.        . Ginkgo Biloba 40 MG TABS Take 1 tablet by mouth 3 (three) times daily. As needed      . Glucosamine HCl 1500 MG TABS Take 1 tablet by mouth daily.        . Guaifenesin (MUCINEX MAXIMUM STRENGTH) 1200 MG TB12 Take 1 tablet by mouth 2 (two) times daily. As needed      . levothyroxine (SYNTHROID, LEVOTHROID) 75 MCG tablet Take 75 mcg by mouth daily. Except Sunday        . levothyroxine (SYNTHROID, LEVOTHROID) 88 MCG tablet Take only sunday       .  lidocaine-prilocaine (EMLA) cream Apply 30 min to skin prior to surgery  30 g  0  . LYSINE PO Once daily       . METHOTREXATE SODIUM, PF, IJ Inject 0.8 mg/mL as directed once a week.       Marland Kitchen METROGEL 1 % gel APPLY EVERY DAY  45 g  1  . milk thistle 175 MG tablet Take 175 mg by mouth daily.      . Misc Natural Products (TART CHERRY ADVANCED PO) Take 1 capsule by mouth 2 (two) times daily.      Marland Kitchen omeprazole-sodium bicarbonate (ZEGERID) 40-1100 MG per capsule Take 1 capsule by mouth daily before breakfast.        . PARoxetine (PAXIL-CR) 12.5 MG 24 hr tablet Take 12.5 mg by mouth 2 (two) times daily.       Marland Kitchen PULMICORT FLEXHALER 180 MCG/ACT inhaler INHALE 2 PUFFS BY MOUTH TWICE A DAY  1 each  3  . sodium chloride (BRONCHO SALINE) inhaler solution Saline wash twice daily      . fluticasone  (FLONASE) 50 MCG/ACT nasal spray USE 2 SPRAYS IN EACH NOSTRIL DAILY  16 g  2  . UNABLE TO FIND Take 2 oz by mouth daily. Med Name: VEMMA (multivitamin vitamin liquid from mangosteen)       No current facility-administered medications for this visit.    SURGICAL HISTORY:  Past Surgical History  Procedure Laterality Date  . Nasal sinus surgery    . Knee arthroscopy    . Rotator cuff repair      REVIEW OF SYSTEMS:  Pertinent items are noted in HPI.   HEALTH MAINTENANCE:  PHYSICAL EXAMINATION: Blood pressure 157/80, pulse 87, temperature 97.3 F (36.3 C), temperature source Oral, resp. rate 20, height 5\' 3"  (1.6 m), weight 178 lb 11.2 oz (81.058 kg). Body mass index is 31.66 kg/(m^2). ECOG PERFORMANCE STATUS: 0 - Asymptomatic   Resp: clear to auscultation bilaterally Cardio: regular rate and rhythm GI: soft, non-tender; bowel sounds normal; no masses,  no organomegaly Extremities: no edema Neurologic: Grossly normal Bilateral breast examination no masses nipple discharge left breast well-healed incisional scar.  LABORATORY DATA: Lab Results  Component Value Date   WBC 7.3 04/17/2012   HGB 13.0 04/17/2012   HCT 38.8 04/17/2012   MCV 88.2 04/17/2012   PLT 313 04/17/2012      Chemistry      Component Value Date/Time   NA 143 04/17/2012 1445   NA 140 10/02/2010 1051   K 3.8 04/17/2012 1445   K 4.4 10/02/2010 1051   CL 106 04/17/2012 1445   CL 105 10/02/2010 1051   CO2 27 04/17/2012 1445   CO2 27 10/02/2010 1051   BUN 15.6 04/17/2012 1445   BUN 22 10/02/2010 1051   CREATININE 0.8 04/17/2012 1445   CREATININE 0.68 10/02/2010 1051   CREATININE 0.7 09/18/2006 1225      Component Value Date/Time   CALCIUM 9.2 04/17/2012 1445   CALCIUM 9.3 10/02/2010 1051   ALKPHOS 75 04/17/2012 1445   ALKPHOS 59 10/02/2010 1051   AST 18 04/17/2012 1445   AST 20 10/02/2010 1051   ALT 40 04/17/2012 1445   ALT 37* 10/02/2010 1051   BILITOT 0.31 04/17/2012 1445   BILITOT 0.4 10/02/2010 1051       RADIOGRAPHIC  STUDIES:  No results found.  ASSESSMENT: 67 year old female with atypical ductal hyperplasia of the left breast status post lumpectomy in October 2013. Patient was seen back in October for  discussion of risk reduction would be best. However up-to-date patient has not taken the Evista she is very scared and would rather not take it. We again discussed the rationale for this. But she is still very concerned she is uncertain if she is going to take it or not. However she would like to be seen periodically by me.   PLAN: I will plan on seeing her back in one years time or sooner if need arises.   All questions were answered. The patient knows to call the clinic with any problems, questions or concerns. We can certainly see the patient much sooner if necessary.  I spent 15 minutes counseling the patient face to face. The total time spent in the appointment was 30 minutes.    Drue Second, MD Medical/Oncology [facility] [phone] (beeper) [phone] (Office)

## 2012-05-05 ENCOUNTER — Ambulatory Visit: Payer: BC Managed Care – PPO | Admitting: Critical Care Medicine

## 2012-05-09 ENCOUNTER — Ambulatory Visit (INDEPENDENT_AMBULATORY_CARE_PROVIDER_SITE_OTHER): Payer: Medicare Other | Admitting: Family Medicine

## 2012-05-09 ENCOUNTER — Encounter: Payer: Self-pay | Admitting: Family Medicine

## 2012-05-09 VITALS — BP 123/73 | HR 74 | Wt 178.0 lb

## 2012-05-09 DIAGNOSIS — R030 Elevated blood-pressure reading, without diagnosis of hypertension: Secondary | ICD-10-CM

## 2012-05-09 DIAGNOSIS — F429 Obsessive-compulsive disorder, unspecified: Secondary | ICD-10-CM | POA: Insufficient documentation

## 2012-05-09 DIAGNOSIS — T50905A Adverse effect of unspecified drugs, medicaments and biological substances, initial encounter: Secondary | ICD-10-CM

## 2012-05-09 DIAGNOSIS — R635 Abnormal weight gain: Secondary | ICD-10-CM

## 2012-05-09 NOTE — Progress Notes (Signed)
  Subjective:    Patient ID: [name], female    DOB: 01-03-1946, 67 y.o.   MRN: [id number]  HPI BP has been up and down at home.  It is an arm cuff. BP has been bouncing from normal to 160s at the highest. Says she avoid NSAIDs and decongestants.  No regular exercise but says tries.  No SOB. Says gets occ CP, but has had normal treadmill stress tests in the past and they have never seen any sign of coronary blockages.   Her oncologist encouraged her to be on Evista. She initially filled the prescription but hesitated to take it. At her followup her oncologist again encouraged her to start it. She's been on for about a month so far and has not had any significant side effects except, she has gained some weight and it concerned about this. She is on Weight Watchers to try to control her calories. She does exercise some but it's not consistent..     Review of Systems     Objective:   Physical Exam  Constitutional: She is oriented to person, place, and time. She appears well-developed and well-nourished.  HENT:  Head: Normocephalic and atraumatic.  Cardiovascular: Normal rate, regular rhythm and normal heart sounds.   Pulmonary/Chest: Effort normal and breath sounds normal.  Neurological: She is alert and oriented to person, place, and time.  Skin: Skin is warm and dry.  Psychiatric: She has a normal mood and affect. Her behavior is normal.          Assessment & Plan:  Blood pressure. Her blood pressure looks fantastic her today in the office.  Bring in cuff to verify it is working well.  Also make sure watching the salt in her diet.  Avoid decongestants and NSAIDs.  H.O givne on low salt diet.  We will follow up in 3 weeks now review her blood pressures at that time. I did encourage her to rest in a couple minutes before she checks her blood pressure home.  Abnromal Weight gain - unfortunately she is on 2 medications that can cause weight gain, Paxil and Evista. She is calm her  points with Weight Watchers at this point I think to counter balance the side effects that she really is just going to have to incorporate exercise into regular routine. If she wants to discontinue the Evista that I really think she needs to discuss this with her oncologist. In the Paxil really helps control her OCD and she's been very happy with her regimen for quite some time. Definitely think she has still wear the risks and benefits of these medications.  OCD-actually did not have this on her problem list I added it. Currently she is well-controlled on her Paxil.

## 2012-05-09 NOTE — Patient Instructions (Addendum)

## 2012-05-13 LAB — VITAMIN D 25 HYDROXY (VIT D DEFICIENCY, FRACTURES): Vit D, 25-Hydroxy: 38

## 2012-05-21 ENCOUNTER — Other Ambulatory Visit: Payer: Self-pay | Admitting: Family Medicine

## 2012-06-03 ENCOUNTER — Ambulatory Visit: Payer: Medicare Other | Admitting: Family Medicine

## 2012-06-03 LAB — LIPID PANEL
Cholesterol: 206 mg/dL — AB (ref 0–200)
LDL Cholesterol: 135 mg/dL
Triglycerides: 119 mg/dL (ref 40–160)

## 2012-06-10 ENCOUNTER — Ambulatory Visit: Payer: Medicare Other | Admitting: Family Medicine

## 2012-06-16 ENCOUNTER — Ambulatory Visit: Payer: Medicare Other | Admitting: Family Medicine

## 2012-06-23 ENCOUNTER — Encounter: Payer: Self-pay | Admitting: Family Medicine

## 2012-06-23 ENCOUNTER — Ambulatory Visit (INDEPENDENT_AMBULATORY_CARE_PROVIDER_SITE_OTHER): Payer: Medicare Other | Admitting: Family Medicine

## 2012-06-23 VITALS — BP 132/77 | HR 78 | Ht 63.0 in | Wt 178.0 lb

## 2012-06-23 DIAGNOSIS — L219 Seborrheic dermatitis, unspecified: Secondary | ICD-10-CM

## 2012-06-23 DIAGNOSIS — I10 Essential (primary) hypertension: Secondary | ICD-10-CM

## 2012-06-23 DIAGNOSIS — B002 Herpesviral gingivostomatitis and pharyngotonsillitis: Secondary | ICD-10-CM

## 2012-06-23 DIAGNOSIS — J309 Allergic rhinitis, unspecified: Secondary | ICD-10-CM

## 2012-06-23 DIAGNOSIS — I723 Aneurysm of iliac artery: Secondary | ICD-10-CM

## 2012-06-23 MED ORDER — BETAMETHASONE VALERATE 0.12 % EX FOAM: 1.0000 "application " | Freq: Two times a day (BID) | CUTANEOUS | Status: DC

## 2012-06-23 NOTE — Progress Notes (Signed)
  Subjective:    Patient ID: [name], female    DOB: [birth year], 67 y.o.   MRN: [id number]  HPI HTN-  Pt denies chest pain, SOB, dizziness, or heart palpitations.  Taking meds as directed w/o problems.  Denies medication side effects.  Brought in BP log from home. Only one high BP.   She had colonoscopy and endoscopy in Feb and all looks great!   AR- She has had more sxs.  She is taking her allegra but is not on her flonase. Fever blister have broken out and she has been stressed lately. She is using peppermint oil.    She also had a breakout in ulcers on her mouth. She thinks it was from increase in stress recently. She also has some lesions on her scalp. She's been using Rogaine for years and at work her well for her but she has noticed over the last year she's been getting these red sore spots on her head. They eventually did resolve and go away on her own. She says they've been a little but worse than usual lately. She tried putting some of her metronidazole gel on them and initially she said it was helping but lately it does not seem to be making a difference.  Review of Systems     Objective:   Physical Exam  Constitutional: She is oriented to person, place, and time. She appears well-developed and well-nourished.  HENT:  Head: Normocephalic and atraumatic.  Cardiovascular: Normal rate, regular rhythm and normal heart sounds.   Pulmonary/Chest: Effort normal and breath sounds normal.  Neurological: She is alert and oriented to person, place, and time.  Skin: Skin is warm and dry.  She has some small erythematous scaling papules on the scalp they're consistent with a mild folliculitis. Most are less than half a centimeter.  Psychiatric: She has a normal mood and affect. Her behavior is normal.          Assessment & Plan:  HTN - Well controlled based on home logs.  Followup in 3 months. Blood pressures look fantastic here today and on her home blood pressure cuff which  she brought in for comparison today. It seems pretty accurate. Her splits into the wall.  AR- restart fluticasone for the next few weeks and continue her allegra. After about 3-4 weeks she can discontinue it if she's doing well. She says it tends to affect her sense of smell so doesn't like to use it too often. Continue to hold on the Mucinex as it does make her a little bit nauseated.  Mouth ulcers - She is doing  well with the peppermint oil. It seems to be helping without using a prescription which is fine.  Scalp lesions-appears to be some mild folliculitis and seborrheic dermatitis-will treat with topical steroid foam. She does have a appointment with her new dermatologist in about a month. If she's not responding then I recommend that she have him look at the areas and see what he thinks. I don't think this is a side effect of the Rogaine so I think it's okay for her to continue that for now.

## 2012-06-27 ENCOUNTER — Other Ambulatory Visit: Payer: Self-pay | Admitting: Critical Care Medicine

## 2012-07-01 ENCOUNTER — Encounter: Payer: Self-pay | Admitting: Critical Care Medicine

## 2012-07-01 ENCOUNTER — Ambulatory Visit (INDEPENDENT_AMBULATORY_CARE_PROVIDER_SITE_OTHER): Payer: Medicare Other | Admitting: Critical Care Medicine

## 2012-07-01 VITALS — BP 124/72 | HR 71 | Temp 98.5°F | Ht 63.0 in | Wt 179.6 lb

## 2012-07-01 DIAGNOSIS — J45909 Unspecified asthma, uncomplicated: Secondary | ICD-10-CM

## 2012-07-01 MED ORDER — FLUTICASONE PROPIONATE 50 MCG/ACT NA SUSP: NASAL | Status: DC

## 2012-07-01 MED ORDER — BUDESONIDE 180 MCG/ACT IN AEPB: 2.0000 | INHALATION_SPRAY | Freq: Every day | RESPIRATORY_TRACT | Status: DC

## 2012-07-01 NOTE — Progress Notes (Signed)
Subjective:    Patient ID: [name], female    DOB: 02-21-1945, 67 y.o.   MRN: [id number]  HPI   F/U of   67 y.o.    white female with chronic allergic rhinitis and extrinsic asthma. The patient also has associated vocal cord dysfunction syndrome and reflux disease.   07/01/2012 Pt did well over wintertime/holidays.  Now no real cough   PUL ASTHMA HISTORY 07/01/2012 11/07/2011  Symptoms 0-2 days/week 0-2 days/week  Nighttime awakenings 0-2/month 0-2/month  Interference with activity No limitations No limitations  SABA use 0-2 days/wk 0-2 days/wk  Exacerbations requiring oral steroids 0-1 / year 0-1 / year     Past Medical History  Diagnosis Date  . Other and unspecified hyperlipidemia   . Unspecified arthropathy, hand   . Peptic ulcer, unspecified site, unspecified as acute or chronic, without mention of hemorrhage, perforation, or obstruction   . Allergic rhinitis, cause unspecified   . Unspecified asthma   . Benign paroxysmal positional vertigo   . Depressive disorder, not elsewhere classified   . Esophageal reflux   . Unspecified hypothyroidism   . Localized osteoarthrosis not specified whether primary or secondary, lower leg   . Other diseases of vocal cords      Family History  Problem Relation Age of Onset  . Heart disease Father   . Colon cancer Mother   . Liver cancer Brother   . Asthma Brother      History   Social History  . Marital Status: Married    Spouse Name: N/A    Number of Children: 3  . Years of Education: N/A   Occupational History  . Retired    Social History Main Topics  . Smoking status: Former Smoker -- 0.30 packs/day for 2 years    Types: Cigarettes    Quit date: 02/12/1958  . Smokeless tobacco: Never Used  . Alcohol Use: Yes     Comment: wine socially  . Drug Use: No  . Sexually Active: Yes   Other Topics Concern  . Not on file   Social History Narrative  . No narrative on file     Allergies  Allergen Reactions  .  Astelin (Azelastine Hcl) Other (See Comments)    Headache  . Morphine     REACTION: Nausea  . Moxifloxacin     REACTION: confusion, dizziness, paranoia     Outpatient Prescriptions Prior to Visit  Medication Sig Dispense Refill  . albuterol (PROVENTIL HFA) 108 (90 BASE) MCG/ACT inhaler Inhale 2 puffs into the lungs every 6 (six) hours as needed.        . Ascorbic Acid (VITAMIN C) 500 MG tablet Take 500 mg by mouth daily.       . Betamethasone Valerate 0.12 % foam Apply 1 application topically 2 (two) times daily.  100 g  0  . Biotin (BIOTIN 5000) 5 MG CAPS Take 1 capsule by mouth daily.        Marland Kitchen buPROPion (WELLBUTRIN XL) 150 MG 24 hr tablet Take 150 mg by mouth daily.        . Calcium Carbonate-Vit D-Min 600-400 MG-UNIT TABS 1 tablet by mouth 2 times daily      . Cholecalciferol (VITAMIN D3) 2000 UNITS TABS Take 1 tablet by mouth daily.        . clonazePAM (KLONOPIN) 0.5 MG tablet 1/4 by mouth daily as needed      . fexofenadine (ALLEGRA) 180 MG tablet Take 180 mg by mouth at bedtime.      Marland Kitchen  folic acid (FOLVITE) 1 MG tablet Take 1 mg by mouth 2 (two) times daily.        . Ginkgo Biloba 40 MG TABS Take 1 tablet by mouth 3 (three) times daily. As needed      . Glucosamine HCl 1500 MG TABS Take 1 tablet by mouth daily.        . Guaifenesin (MUCINEX MAXIMUM STRENGTH) 1200 MG TB12 Take 1 tablet by mouth 2 (two) times daily. As needed      . levothyroxine (SYNTHROID, LEVOTHROID) 75 MCG tablet Take 75 mcg by mouth daily. Except Sunday        . levothyroxine (SYNTHROID, LEVOTHROID) 88 MCG tablet Take only sunday       . LYSINE PO Take 2 capsules by mouth. Once daily      . METHOTREXATE SODIUM, PF, IJ Inject 0.8 mg/mL as directed once a week.       Marland Kitchen METROGEL 1 % gel APPLY EVERY DAY  60 g  1  . milk thistle 175 MG tablet Take 175 mg by mouth daily.      . Misc Natural Products (TART CHERRY ADVANCED PO) Take 1 capsule by mouth daily.       Marland Kitchen omeprazole-sodium bicarbonate (ZEGERID) 40-1100 MG per  capsule Take 1 capsule by mouth daily before breakfast.        . PARoxetine (PAXIL-CR) 12.5 MG 24 hr tablet Take 12.5 mg by mouth 2 (two) times daily.       . sodium chloride (BRONCHO SALINE) inhaler solution Saline wash twice daily      . fluticasone (FLONASE) 50 MCG/ACT nasal spray USE 2 SPRAYS IN EACH NOSTRIL DAILY  16 g  2  . PULMICORT FLEXHALER 180 MCG/ACT inhaler INHALE 2 PUFFS BY MOUTH TWICE A DAY  1 each  3  . lidocaine-prilocaine (EMLA) cream Apply 30 min to skin prior to surgery  30 g  0   No facility-administered medications prior to visit.     Review of Systems  Constitutional:   No  weight loss, night sweats,  Fevers, chills, fatigue, lassitude. HEENT:   No headaches,  Difficulty swallowing,  Tooth/dental problems   CV:  No chest pain,  Orthopnea, PND, swelling in lower extremities, anasarca, dizziness, palpitations  GI  No heartburn, indigestion, abdominal pain, nausea, vomiting, diarrhea, change in bowel habits, loss of appetite  Resp:  .  No excess mucus   No coughing up of blood.  No change in color of mucus.    No chest wall deformity  Skin: no rash or lesions.  GU: no dysuria, change in color of urine, no urgency or frequency.  No flank pain.  MS:  No joint pain or swelling.  No decreased range of motion.  No back pain.  Psych:  No change in mood or affect. No depression or anxiety.  No memory loss.     Objective:   Physical Exam BP 124/72  Pulse 71  Temp(Src) 98.5 F (36.9 C) (Oral)  Ht 5\' 3"  (1.6 m)  Wt 179 lb 9.6 oz (81.466 kg)  BMI 31.82 kg/m2  SpO2 96%  Gen: Pleasant, well-nourished, in no distress,  normal affect  ENT: NM pale with clear mucus, no  post nasal drip  Neck: No JVD, no TMG, no carotid bruits  Lungs: No use of accessory muscles, no dullness to percussion, no wheeze    Cardiovascular: RRR, heart sounds normal, no murmur or gallops, no peripheral edema  Abdomen: soft and NT, no  HSM,  BS normal  Musculoskeletal: No deformities,  no cyanosis or clubbing  Neuro: alert, non focal  Skin: Warm, no lesions or rashes             Assessment & Plan:   Asthma with allergic rhinitis Moderate persistent asthma with allergic rhinitis stable at this time Plan Continue nasal steroid and Pulmicort    Updated Medication List Outpatient Encounter Prescriptions as of 07/01/2012  Medication Sig Dispense Refill  . albuterol (PROVENTIL HFA) 108 (90 BASE) MCG/ACT inhaler Inhale 2 puffs into the lungs every 6 (six) hours as needed.        . Ascorbic Acid (VITAMIN C) 500 MG tablet Take 500 mg by mouth daily.       . Betamethasone Valerate 0.12 % foam Apply 1 application topically 2 (two) times daily.  100 g  0  . Biotin (BIOTIN 5000) 5 MG CAPS Take 1 capsule by mouth daily.        . budesonide (PULMICORT FLEXHALER) 180 MCG/ACT inhaler Inhale 2 puffs into the lungs daily.  1 Inhaler  11  . buPROPion (WELLBUTRIN XL) 150 MG 24 hr tablet Take 150 mg by mouth daily.        . Calcium Carbonate-Vit D-Min 600-400 MG-UNIT TABS 1 tablet by mouth 2 times daily      . Cholecalciferol (VITAMIN D3) 2000 UNITS TABS Take 1 tablet by mouth daily.        . clonazePAM (KLONOPIN) 0.5 MG tablet 1/4 by mouth daily as needed      . fexofenadine (ALLEGRA) 180 MG tablet Take 180 mg by mouth at bedtime.      . fluticasone (FLONASE) 50 MCG/ACT nasal spray USE 2 SPRAYS IN EACH NOSTRIL DAILY  16 g  11  . folic acid (FOLVITE) 1 MG tablet Take 1 mg by mouth 2 (two) times daily.        . Ginkgo Biloba 40 MG TABS Take 1 tablet by mouth 3 (three) times daily. As needed      . Glucosamine HCl 1500 MG TABS Take 1 tablet by mouth daily.        . Guaifenesin (MUCINEX MAXIMUM STRENGTH) 1200 MG TB12 Take 1 tablet by mouth 2 (two) times daily. As needed      . levothyroxine (SYNTHROID, LEVOTHROID) 75 MCG tablet Take 75 mcg by mouth daily. Except Sunday        . levothyroxine (SYNTHROID, LEVOTHROID) 88 MCG tablet Take only sunday       . LYSINE PO Take 2 capsules  by mouth. Once daily      . METHOTREXATE SODIUM, PF, IJ Inject 0.8 mg/mL as directed once a week.       Marland Kitchen METROGEL 1 % gel APPLY EVERY DAY  60 g  1  . milk thistle 175 MG tablet Take 175 mg by mouth daily.      . Misc Natural Products (TART CHERRY ADVANCED PO) Take 1 capsule by mouth daily.       Marland Kitchen omeprazole-sodium bicarbonate (ZEGERID) 40-1100 MG per capsule Take 1 capsule by mouth daily before breakfast.        . PARoxetine (PAXIL-CR) 12.5 MG 24 hr tablet Take 12.5 mg by mouth 2 (two) times daily.       . sodium chloride (BRONCHO SALINE) inhaler solution Saline wash twice daily      . [DISCONTINUED] budesonide (PULMICORT FLEXHALER) 180 MCG/ACT inhaler Inhale 2 puffs into the lungs daily.       . [  DISCONTINUED] fluticasone (FLONASE) 50 MCG/ACT nasal spray USE 2 SPRAYS IN EACH NOSTRIL DAILY  16 g  2  . [DISCONTINUED] PULMICORT FLEXHALER 180 MCG/ACT inhaler INHALE 2 PUFFS BY MOUTH TWICE A DAY  1 each  3  . [DISCONTINUED] lidocaine-prilocaine (EMLA) cream Apply 30 min to skin prior to surgery  30 g  0   No facility-administered encounter medications on file as of 07/01/2012.

## 2012-07-01 NOTE — Patient Instructions (Addendum)
No changes in inhalers Return 5 months

## 2012-07-01 NOTE — Assessment & Plan Note (Signed)
Moderate persistent asthma with allergic rhinitis stable at this time Plan Continue nasal steroid and Pulmicort

## 2012-07-08 ENCOUNTER — Telehealth: Payer: Self-pay | Admitting: *Deleted

## 2012-07-08 NOTE — Telephone Encounter (Signed)
Pt calls today & states that she can't get a derm appt for the red bumps on her head until oct.  She states that you had told her that you knew another place in [place] that we could send her to.  Please advise

## 2012-07-08 NOTE — Telephone Encounter (Signed)
Lets try [facility]

## 2012-07-09 NOTE — Telephone Encounter (Signed)
Gave pt [facility] info.

## 2012-07-17 ENCOUNTER — Other Ambulatory Visit: Payer: Self-pay | Admitting: Family Medicine

## 2012-07-17 ENCOUNTER — Other Ambulatory Visit: Payer: Self-pay

## 2012-07-17 DIAGNOSIS — Z1231 Encounter for screening mammogram for malignant neoplasm of breast: Secondary | ICD-10-CM

## 2012-07-18 ENCOUNTER — Other Ambulatory Visit: Payer: Self-pay

## 2012-07-18 DIAGNOSIS — N951 Menopausal and female climacteric states: Secondary | ICD-10-CM

## 2012-08-21 ENCOUNTER — Ambulatory Visit
Admission: RE | Admit: 2012-08-21 | Discharge: 2012-08-21 | Disposition: A | Discharge status: Home / Self Care | Payer: Medicare Other | Source: Ambulatory Visit

## 2012-08-21 DIAGNOSIS — Z1231 Encounter for screening mammogram for malignant neoplasm of breast: Secondary | ICD-10-CM

## 2012-08-21 DIAGNOSIS — N951 Menopausal and female climacteric states: Secondary | ICD-10-CM

## 2012-08-27 ENCOUNTER — Telehealth: Payer: Self-pay | Admitting: Family Medicine

## 2012-08-27 NOTE — Telephone Encounter (Signed)
Pt informed.[name]  

## 2012-08-27 NOTE — Telephone Encounter (Signed)
Call patient: Bone density test shows that she is in the osteopenia range. T score of -1.1. Recommend continue with regular exercise as well as calcium and vitamin D supplementation and recheck in 2 years.

## 2012-09-23 ENCOUNTER — Encounter: Payer: Self-pay | Admitting: Family Medicine

## 2012-09-23 ENCOUNTER — Ambulatory Visit (INDEPENDENT_AMBULATORY_CARE_PROVIDER_SITE_OTHER): Payer: Medicare Other | Admitting: Family Medicine

## 2012-09-23 VITALS — BP 123/75 | HR 73 | Ht 62.75 in | Wt 176.0 lb

## 2012-09-23 DIAGNOSIS — I10 Essential (primary) hypertension: Secondary | ICD-10-CM

## 2012-09-23 DIAGNOSIS — H43819 Vitreous degeneration, unspecified eye: Secondary | ICD-10-CM | POA: Insufficient documentation

## 2012-09-23 DIAGNOSIS — E785 Hyperlipidemia, unspecified: Secondary | ICD-10-CM

## 2012-09-23 DIAGNOSIS — H43813 Vitreous degeneration, bilateral: Secondary | ICD-10-CM

## 2012-09-23 DIAGNOSIS — Z23 Encounter for immunization: Secondary | ICD-10-CM

## 2012-09-23 NOTE — Patient Instructions (Addendum)
Please come in in the fall for your flu shot

## 2012-09-23 NOTE — Addendum Note (Signed)
Addended by: Deno Etienne on: 09/23/2012 05:15 PM   Modules accepted: Orders, SmartSet

## 2012-09-23 NOTE — Progress Notes (Signed)
  Subjective:    Patient ID: [name], female    DOB: 1946-01-13, 67 y.o.   MRN: [id number]  HPI HTN - forgot to brin her log.  She reports that she Had 2 days at end of July where BP in the 115.  One day had HA one day and BP went up to 147. Most BPs running 130s/70-80.  No chest pain, shortness of breath or palpitations.  Hypothyroidism-she sees Dr. Abbey Chatters. They have been following her levels every 3-4 months. We will enter this lab results into the computer system. He has been following her cholesterol as well.   Review of Systems     Objective:   Physical Exam  Constitutional: She is oriented to person, place, and time. She appears well-developed and well-nourished.  HENT:  Head: Normocephalic and atraumatic.  Cardiovascular: Normal rate, regular rhythm and normal heart sounds.   Pulmonary/Chest: Effort normal and breath sounds normal.  Neurological: She is alert and oriented to person, place, and time.  Skin: Skin is warm and dry.  Psychiatric: She has a normal mood and affect. Her behavior is normal.          Assessment & Plan:  HTN - Well controlled.  Follow up in 6 months.  Hyperlipidemia-will get labs injured that Dr. Abbey Chatters checked.  Tdap recommended today.

## 2012-09-25 ENCOUNTER — Encounter: Payer: Self-pay | Admitting: *Deleted

## 2012-12-27 ENCOUNTER — Other Ambulatory Visit: Payer: Self-pay | Admitting: Critical Care Medicine

## 2012-12-30 ENCOUNTER — Other Ambulatory Visit: Payer: Self-pay | Admitting: Family Medicine

## 2013-01-02 ENCOUNTER — Ambulatory Visit: Payer: Medicare Other | Admitting: Critical Care Medicine

## 2013-01-12 ENCOUNTER — Ambulatory Visit: Payer: Medicare Other | Admitting: Critical Care Medicine

## 2013-01-12 LAB — HEMOGLOBIN A1C: HEMOGLOBIN A1C: 5.8

## 2013-01-13 ENCOUNTER — Ambulatory Visit: Payer: Medicare Other | Admitting: Family Medicine

## 2013-01-16 ENCOUNTER — Encounter: Payer: Self-pay | Admitting: Family Medicine

## 2013-01-16 ENCOUNTER — Ambulatory Visit: Payer: Medicare Other | Admitting: Family Medicine

## 2013-01-16 ENCOUNTER — Ambulatory Visit (INDEPENDENT_AMBULATORY_CARE_PROVIDER_SITE_OTHER): Payer: Medicare Other

## 2013-01-16 ENCOUNTER — Ambulatory Visit (INDEPENDENT_AMBULATORY_CARE_PROVIDER_SITE_OTHER): Payer: Medicare Other | Admitting: Family Medicine

## 2013-01-16 VITALS — BP 138/76 | HR 83 | Temp 97.1°F | Ht 62.75 in | Wt 186.0 lb

## 2013-01-16 DIAGNOSIS — R0789 Other chest pain: Secondary | ICD-10-CM

## 2013-01-16 DIAGNOSIS — R05 Cough: Secondary | ICD-10-CM

## 2013-01-16 DIAGNOSIS — R5381 Other malaise: Secondary | ICD-10-CM

## 2013-01-16 DIAGNOSIS — R0602 Shortness of breath: Secondary | ICD-10-CM

## 2013-01-16 DIAGNOSIS — M069 Rheumatoid arthritis, unspecified: Secondary | ICD-10-CM

## 2013-01-16 MED ORDER — CLINDAMYCIN PHOSPHATE 1 % EX LOTN: TOPICAL_LOTION | Freq: Two times a day (BID) | CUTANEOUS | Status: DC

## 2013-01-16 NOTE — Progress Notes (Signed)
Subjective:    Patient ID: [name], female    DOB: November 20, 1945, 67 y.o.   MRN: [id number]  HPI Complains of chest pain and shortness of breath since Oct, around the time her grandchild was born premature. Has been very stressed. Has been visiting the hospital daily.  Sleeping well overall.  She does have a history of hypertension and hyperlipidemia. CP is bilat and is more towards the outside of the chest wall.  Says occ pain is sharp.  Comes at anytime with rest or activity.  No pain radiating into arms, jaw, or neck.  She feels extremely tired.  Has been more forgetful.  Has had a cough since October.  It was worse last month and has been better this month. No diaphorosis.   Stopped the MTX several months ago.  Has started taking Tylenol arthritis. She has a follow with her rheumatologist in January. He is wanting her to start Remicade release consider it. She is really hesitant about starting any new medications. Review of Systems BP 138/76  Pulse 83  Temp(Src) 97.1 F (36.2 C)  Ht 5' 2.75" (1.594 m)  Wt 186 lb (84.369 kg)  BMI 33.21 kg/m2    Allergies  Allergen Reactions  . Astelin [Azelastine Hcl] Other (See Comments)    Headache  . Morphine     REACTION: Nausea  . Moxifloxacin     REACTION: confusion, dizziness, paranoia    Past Medical History  Diagnosis Date  . Other and unspecified hyperlipidemia   . Unspecified arthropathy, hand   . Peptic ulcer, unspecified site, unspecified as acute or chronic, without mention of hemorrhage, perforation, or obstruction   . Allergic rhinitis, cause unspecified   . Unspecified asthma(493.90)   . Benign paroxysmal positional vertigo   . Depressive disorder, not elsewhere classified   . Esophageal reflux   . Unspecified hypothyroidism   . Localized osteoarthrosis not specified whether primary or secondary, lower leg   . Other diseases of vocal cords     Past Surgical History  Procedure Laterality Date  . Nasal sinus  surgery    . Knee arthroscopy    . Rotator cuff repair      History   Social History  . Marital Status: Married    Spouse Name: N/A    Number of Children: 3  . Years of Education: N/A   Occupational History  . Retired    Social History Main Topics  . Smoking status: Former Smoker -- 0.30 packs/day for 2 years    Types: Cigarettes    Quit date: 02/12/1958  . Smokeless tobacco: Never Used  . Alcohol Use: Yes     Comment: wine socially  . Drug Use: No  . Sexual Activity: Yes   Other Topics Concern  . Not on file   Social History Narrative  . No narrative on file    Family History  Problem Relation Age of Onset  . Heart disease Father   . Colon cancer Mother   . Liver cancer Brother   . Asthma Brother     Outpatient Encounter Prescriptions as of 01/16/2013  Medication Sig  . clindamycin (CLEOCIN T) 1 % lotion Apply topically 2 (two) times daily.  Marland Kitchen albuterol (PROVENTIL HFA) 108 (90 BASE) MCG/ACT inhaler Inhale 2 puffs into the lungs every 6 (six) hours as needed.    . Ascorbic Acid (VITAMIN C) 500 MG tablet Take 500 mg by mouth daily.   . Biotin (BIOTIN 5000) 5 MG CAPS Take  1 capsule by mouth daily.    . budesonide (PULMICORT FLEXHALER) 180 MCG/ACT inhaler Inhale 2 puffs into the lungs daily.  . budesonide (PULMICORT FLEXHALER) 180 MCG/ACT inhaler Inhale 2 puffs into the lungs daily.  Marland Kitchen buPROPion (WELLBUTRIN XL) 150 MG 24 hr tablet Take 150 mg by mouth daily.    . Calcium Carbonate-Vit D-Min 600-400 MG-UNIT TABS 1 tablet by mouth 2 times daily  . Cholecalciferol (VITAMIN D3) 2000 UNITS TABS Take 1 tablet by mouth daily.    . clonazePAM (KLONOPIN) 0.5 MG tablet 1/4 by mouth daily as needed  . fexofenadine (ALLEGRA) 180 MG tablet Take 180 mg by mouth at bedtime.  . fluticasone (FLONASE) 50 MCG/ACT nasal spray USE 2 SPRAYS IN EACH NOSTRIL DAILY  . folic acid (FOLVITE) 1 MG tablet Take 1 mg by mouth 2 (two) times daily.    . Ginkgo Biloba 40 MG TABS Take 1 tablet by  mouth 3 (three) times daily. As needed  . Glucosamine HCl 1500 MG TABS Take 1 tablet by mouth daily.    Marland Kitchen levothyroxine (SYNTHROID, LEVOTHROID) 75 MCG tablet Take 75 mcg by mouth daily. Except Sunday    . levothyroxine (SYNTHROID, LEVOTHROID) 88 MCG tablet Take only sunday   . LYSINE PO Take 1 capsule by mouth. Once daily  . METROGEL 1 % gel APPLY EVERY DAY  . omeprazole-sodium bicarbonate (ZEGERID) 40-1100 MG per capsule Take 1 capsule by mouth daily before breakfast.    . PARoxetine (PAXIL-CR) 12.5 MG 24 hr tablet Take 12.5 mg by mouth 2 (two) times daily.   . sodium chloride (BRONCHO SALINE) inhaler solution Saline wash twice daily  . [DISCONTINUED] Betamethasone Valerate 0.12 % foam APPLY TWICE A DAY  . [DISCONTINUED] METHOTREXATE SODIUM, PF, IJ Inject 0.8 mg/mL as directed once a week.   . [DISCONTINUED] milk thistle 175 MG tablet Take 175 mg by mouth daily.  . [DISCONTINUED] Misc Natural Products (TART CHERRY ADVANCED PO) Take 1 capsule by mouth daily.           Objective:   Physical Exam  Constitutional: She is oriented to person, place, and time. She appears well-developed and well-nourished.  HENT:  Head: Normocephalic and atraumatic.  Neck: Neck supple. No thyromegaly present.  Cardiovascular: Normal rate, regular rhythm and normal heart sounds.   Pulmonary/Chest: Effort normal.  Rhonchi in the left lower lobe, otherwise clear to auscultation no wheezing.  Musculoskeletal:  Swan-neck deformity of both hands.  Lymphadenopathy:    She has no cervical adenopathy.  Neurological: She is alert and oriented to person, place, and time.  Skin: Skin is warm and dry.  Psychiatric: She has a normal mood and affect. Her behavior is normal.          Assessment & Plan:  Atypical chest pain-unclear etiology. I'm suspicious that her increased stress levels may be causing some of this. I tried to reassure her that her pain is bilateral over her chest and can happen at rest which is  atypical of cardiac pain. EKG shows rate of 80 beats per minute, left axis deviation, no acute ST-T wave elevations, inverted T wave in lead V2. Unchanged from previous EKG done in 2012 by Dr. Olga Millers.  Will check CBC to evaluate for anemia, check thyroid level, check CMP for electrolytes and kidney abnormalities.  Fatigue - will check CBC, TSH, etc. though I suspect some of her fatigue is just from her stress and spending long days at the hospital with her grandchild.  Rheumatoid arthritis-encouraged  her to really think about getting on another agent since she didn't tolerate the methotrexate well. She complains of constant joint pain and significant stiffness especially in the morning and if she's been sitting for a while. Okay to increase, arthritis or twice a day. We will monitor her liver enzymes.  Cough-it sounds like her cough is actually improved over the last month. Suspect that she may have had an acute bronchitis with a postviral cough. That I did here some rhonchi in the left lower lobe so would like to get a chest x-ray today.

## 2013-01-17 LAB — CBC WITH DIFFERENTIAL/PLATELET
Basophils Absolute: 0.1 10*3/uL (ref 0.0–0.1)
Basophils Relative: 1 % (ref 0–1)
Eosinophils Absolute: 0.3 10*3/uL (ref 0.0–0.7)
MCH: 27.9 pg (ref 26.0–34.0)
MCHC: 33.7 g/dL (ref 30.0–36.0)
Neutro Abs: 3.7 10*3/uL (ref 1.7–7.7)
Neutrophils Relative %: 61 % (ref 43–77)
Platelets: 351 10*3/uL (ref 150–400)
RDW: 14.1 % (ref 11.5–15.5)

## 2013-01-17 LAB — COMPLETE METABOLIC PANEL WITH GFR
Albumin: 4.1 g/dL (ref 3.5–5.2)
BUN: 17 mg/dL (ref 6–23)
CO2: 30 mEq/L (ref 19–32)
GFR, Est African American: >89 mL/min
GFR, Est Non African American: >89 mL/min
Glucose, Bld: 65 mg/dL — ABNORMAL LOW (ref 70–99)
Potassium: 4.6 mEq/L (ref 3.5–5.3)
Sodium: 139 mEq/L (ref 135–145)
Total Bilirubin: 0.3 mg/dL (ref 0.3–1.2)
Total Protein: 6.8 g/dL (ref 6.0–8.3)

## 2013-01-17 LAB — TSH: TSH: 0.925 u[IU]/mL (ref 0.350–4.500)

## 2013-01-17 LAB — TROPONIN I: Troponin I: <0.01 ng/mL (ref <0.06)

## 2013-01-17 LAB — SEDIMENTATION RATE: Sed Rate: 16 mm/hr (ref 0–22)

## 2013-01-19 ENCOUNTER — Other Ambulatory Visit: Payer: Self-pay | Admitting: Family Medicine

## 2013-01-19 MED ORDER — MELOXICAM 15 MG PO TABS: 15.0000 mg | ORAL_TABLET | Freq: Every morning | ORAL | Status: DC

## 2013-01-26 ENCOUNTER — Encounter: Payer: Self-pay | Admitting: Critical Care Medicine

## 2013-01-26 ENCOUNTER — Ambulatory Visit (INDEPENDENT_AMBULATORY_CARE_PROVIDER_SITE_OTHER): Payer: Medicare Other | Admitting: Critical Care Medicine

## 2013-01-26 VITALS — BP 132/88 | HR 76 | Ht 63.0 in | Wt 182.0 lb

## 2013-01-26 DIAGNOSIS — M159 Polyosteoarthritis, unspecified: Secondary | ICD-10-CM

## 2013-01-26 DIAGNOSIS — J45909 Unspecified asthma, uncomplicated: Secondary | ICD-10-CM

## 2013-01-26 MED ORDER — ALBUTEROL SULFATE HFA 108 (90 BASE) MCG/ACT IN AERS: 2.0000 | INHALATION_SPRAY | Freq: Four times a day (QID) | RESPIRATORY_TRACT | Status: DC | PRN

## 2013-01-26 NOTE — Patient Instructions (Signed)
No change in medications. Return in      6 months Flu vaccine will be given

## 2013-01-26 NOTE — Progress Notes (Signed)
Subjective:    Patient ID: [name], female    DOB: 30-Apr-1945, 67 y.o.   MRN: [id number]  HPI  F/U of   67 y.o.    white female with chronic allergic rhinitis and extrinsic asthma. The patient also has associated vocal cord dysfunction syndrome and reflux disease.   01/26/2013 Chief Complaint  Patient presents with  . Asthma    Breathing is doing well. Reports slight cough from time to time. Denies SOB, chest tightness, wheezing.  Pt under stress with grandchild in NICU.  Now min cough.  Dyspnea stable. Last wheezing was three weeks ago.   Pt denies any significant sore throat, nasal congestion or excess secretions, fever, chills, sweats, unintended weight loss, pleurtic or exertional chest pain, orthopnea PND, or leg swelling Pt denies any increase in rescue therapy over baseline, denies waking up needing it or having any early am or nocturnal exacerbations of coughing/wheezing/or dyspnea. Pt also denies any obvious fluctuation in symptoms with  weather or environmental change or other alleviating or aggravating factors    PUL ASTHMA HISTORY 07/01/2012 11/07/2011  Symptoms 0-2 days/week 0-2 days/week  Nighttime awakenings 0-2/month 0-2/month  Interference with activity No limitations No limitations  SABA use 0-2 days/wk 0-2 days/wk  Exacerbations requiring oral steroids 0-1 / year 0-1 / year     Past Medical History  Diagnosis Date  . Other and unspecified hyperlipidemia   . Unspecified arthropathy, hand   . Peptic ulcer, unspecified site, unspecified as acute or chronic, without mention of hemorrhage, perforation, or obstruction   . Allergic rhinitis, cause unspecified   . Unspecified asthma(493.90)   . Benign paroxysmal positional vertigo   . Depressive disorder, not elsewhere classified   . Esophageal reflux   . Unspecified hypothyroidism   . Localized osteoarthrosis not specified whether primary or secondary, lower leg   . Other diseases of vocal cords       Family History  Problem Relation Age of Onset  . Heart disease Father   . Colon cancer Mother   . Liver cancer Brother   . Asthma Brother      History   Social History  . Marital Status: Married    Spouse Name: N/A    Number of Children: 3  . Years of Education: N/A   Occupational History  . Retired    Social History Main Topics  . Smoking status: Former Smoker -- 0.30 packs/day for 2 years    Types: Cigarettes    Quit date: 02/12/1958  . Smokeless tobacco: Never Used  . Alcohol Use: Yes     Comment: wine socially  . Drug Use: No  . Sexual Activity: Yes   Other Topics Concern  . Not on file   Social History Narrative  . No narrative on file     Allergies  Allergen Reactions  . Astelin [Azelastine Hcl] Other (See Comments)    Headache  . Morphine     REACTION: Nausea  . Moxifloxacin     REACTION: confusion, dizziness, paranoia     Outpatient Prescriptions Prior to Visit  Medication Sig Dispense Refill  . Biotin (BIOTIN 5000) 5 MG CAPS Take 1 capsule by mouth daily.        . budesonide (PULMICORT FLEXHALER) 180 MCG/ACT inhaler Inhale 2 puffs into the lungs daily.  1 each  3  . buPROPion (WELLBUTRIN XL) 150 MG 24 hr tablet Take 150 mg by mouth daily.        . Calcium Carbonate-Vit D-Min  600-400 MG-UNIT TABS 1 tablet by mouth 2 times daily      . Cholecalciferol (VITAMIN D3) 2000 UNITS TABS Take 1 tablet by mouth daily.        . clonazePAM (KLONOPIN) 0.5 MG tablet 1/4 by mouth daily as needed      . fexofenadine (ALLEGRA) 180 MG tablet Take 180 mg by mouth at bedtime.      . fluticasone (FLONASE) 50 MCG/ACT nasal spray USE 2 SPRAYS IN EACH NOSTRIL DAILY  16 g  11  . folic acid (FOLVITE) 1 MG tablet Take 1 mg by mouth 2 (two) times daily.        . Ginkgo Biloba 40 MG TABS Take 1 tablet by mouth 3 (three) times daily. As needed      . levothyroxine (SYNTHROID, LEVOTHROID) 75 MCG tablet Take 75 mcg by mouth daily. Except Sunday        . levothyroxine  (SYNTHROID, LEVOTHROID) 88 MCG tablet Take only sunday       . LYSINE PO Take 1 capsule by mouth. Once daily      . meloxicam (MOBIC) 15 MG tablet Take 1 tablet (15 mg total) by mouth every morning.  30 tablet  2  . METROGEL 1 % gel APPLY EVERY DAY  60 g  1  . omeprazole-sodium bicarbonate (ZEGERID) 40-1100 MG per capsule Take 1 capsule by mouth daily before breakfast.        . PARoxetine (PAXIL-CR) 12.5 MG 24 hr tablet Take 12.5 mg by mouth 2 (two) times daily.       . sodium chloride (BRONCHO SALINE) inhaler solution Saline wash twice daily      . albuterol (PROVENTIL HFA) 108 (90 BASE) MCG/ACT inhaler Inhale 2 puffs into the lungs every 6 (six) hours as needed.        . clindamycin (CLEOCIN T) 1 % lotion Apply topically 2 (two) times daily.  60 mL  2   No facility-administered medications prior to visit.     Review of Systems Constitutional:   No  weight loss, night sweats,  Fevers, chills, fatigue, lassitude. HEENT:   No headaches,  Difficulty swallowing,  Tooth/dental problems   CV:  No chest pain,  Orthopnea, PND, swelling in lower extremities, anasarca, dizziness, palpitations  GI  No heartburn, indigestion, abdominal pain, nausea, vomiting, diarrhea, change in bowel habits, loss of appetite  Resp:  .  No excess mucus   No coughing up of blood.  No change in color of mucus.    No chest wall deformity  Skin: no rash or lesions.  GU: no dysuria, change in color of urine, no urgency or frequency.  No flank pain.  MS:  No joint pain or swelling.  No decreased range of motion.  No back pain.  Psych:  No change in mood or affect. No depression or anxiety.  No memory loss.     Objective:   Physical Exam BP 132/88  Pulse 76  Ht 5\' 3"  (1.6 m)  Wt 182 lb (82.555 kg)  BMI 32.25 kg/m2  SpO2 97%  Gen: Pleasant, well-nourished, in no distress,  normal affect  ENT: NM pale with clear mucus, no  post nasal drip  Neck: No JVD, no TMG, no carotid bruits  Lungs: No use of  accessory muscles, no dullness to percussion, no wheeze    Cardiovascular: RRR, heart sounds normal, no murmur or gallops, no peripheral edema  Abdomen: soft and NT, no HSM,  BS normal  Musculoskeletal:  No deformities, no cyanosis or clubbing  Neuro: alert, non focal  Skin: Warm, no lesions or rashes     Assessment & Plan:   Asthma with allergic rhinitis Moderate persistent asthma with allergic features stable at this time Plan Maintain inhaled steroid Administer flu vaccine    Updated Medication List Outpatient Encounter Prescriptions as of 01/26/2013  Medication Sig  . albuterol (PROVENTIL HFA) 108 (90 BASE) MCG/ACT inhaler Inhale 2 puffs into the lungs every 6 (six) hours as needed.  . Biotin (BIOTIN 5000) 5 MG CAPS Take 1 capsule by mouth daily.    . budesonide (PULMICORT FLEXHALER) 180 MCG/ACT inhaler Inhale 2 puffs into the lungs daily.  Marland Kitchen buPROPion (WELLBUTRIN XL) 150 MG 24 hr tablet Take 150 mg by mouth daily.    . Calcium Carbonate-Vit D-Min 600-400 MG-UNIT TABS 1 tablet by mouth 2 times daily  . Cholecalciferol (VITAMIN D3) 2000 UNITS TABS Take 1 tablet by mouth daily.    . clindamycin (CLEOCIN T) 1 % lotion Apply topically 2 (two) times daily as needed.  . clonazePAM (KLONOPIN) 0.5 MG tablet 1/4 by mouth daily as needed  . fexofenadine (ALLEGRA) 180 MG tablet Take 180 mg by mouth at bedtime.  . fluticasone (FLONASE) 50 MCG/ACT nasal spray USE 2 SPRAYS IN EACH NOSTRIL DAILY  . folic acid (FOLVITE) 1 MG tablet Take 1 mg by mouth 2 (two) times daily.    . Ginkgo Biloba 40 MG TABS Take 1 tablet by mouth 3 (three) times daily. As needed  . levothyroxine (SYNTHROID, LEVOTHROID) 75 MCG tablet Take 75 mcg by mouth daily. Except Sunday    . levothyroxine (SYNTHROID, LEVOTHROID) 88 MCG tablet Take only sunday   . LYSINE PO Take 1 capsule by mouth. Once daily  . meloxicam (MOBIC) 15 MG tablet Take 1 tablet (15 mg total) by mouth every morning.  Marland Kitchen METROGEL 1 % gel APPLY  EVERY DAY  . omeprazole-sodium bicarbonate (ZEGERID) 40-1100 MG per capsule Take 1 capsule by mouth daily before breakfast.    . PARoxetine (PAXIL-CR) 12.5 MG 24 hr tablet Take 12.5 mg by mouth 2 (two) times daily.   . sodium chloride (BRONCHO SALINE) inhaler solution Saline wash twice daily  . [DISCONTINUED] albuterol (PROVENTIL HFA) 108 (90 BASE) MCG/ACT inhaler Inhale 2 puffs into the lungs every 6 (six) hours as needed.    . [DISCONTINUED] clindamycin (CLEOCIN T) 1 % lotion Apply topically 2 (two) times daily.

## 2013-01-27 NOTE — Assessment & Plan Note (Signed)
Moderate persistent asthma with allergic features stable at this time Plan Maintain inhaled steroid Administer flu vaccine

## 2013-03-17 DIAGNOSIS — F429 Obsessive-compulsive disorder, unspecified: Secondary | ICD-10-CM | POA: Diagnosis not present

## 2013-03-17 DIAGNOSIS — F909 Attention-deficit hyperactivity disorder, unspecified type: Secondary | ICD-10-CM | POA: Diagnosis not present

## 2013-03-17 DIAGNOSIS — F339 Major depressive disorder, recurrent, unspecified: Secondary | ICD-10-CM | POA: Diagnosis not present

## 2013-03-23 ENCOUNTER — Telehealth: Payer: Self-pay | Admitting: *Deleted

## 2013-03-23 NOTE — Telephone Encounter (Signed)
Pt called and stated that she was feeling achy but no fever (98.9). She wanted to know what she could take. Informed her that she can try sudafed, delsym,tylenol,zinc or cold eeze, vit C, run a humidifier, and try hot tea w/honey and lemon and wash hands. Told that should run a fever, throat gets worse, or cough becomes productive to make an appt. She voiced understanding and agreed.Elouise Munroe'

## 2013-03-24 ENCOUNTER — Encounter: Payer: Self-pay | Admitting: Adult Health

## 2013-03-24 ENCOUNTER — Ambulatory Visit (INDEPENDENT_AMBULATORY_CARE_PROVIDER_SITE_OTHER): Payer: Medicare Other | Admitting: Adult Health

## 2013-03-24 ENCOUNTER — Telehealth: Payer: Self-pay | Admitting: Adult Health

## 2013-03-24 VITALS — BP 120/75 | HR 87 | Temp 98.8°F | Ht 63.0 in | Wt 178.6 lb

## 2013-03-24 DIAGNOSIS — J45909 Unspecified asthma, uncomplicated: Secondary | ICD-10-CM | POA: Diagnosis not present

## 2013-03-24 MED ORDER — AMOXICILLIN-POT CLAVULANATE 875-125 MG PO TABS: 1.0000 | ORAL_TABLET | Freq: Two times a day (BID) | ORAL | Status: AC

## 2013-03-24 MED ORDER — PREDNISONE 10 MG PO TABS: ORAL_TABLET | ORAL | Status: DC

## 2013-03-24 MED ORDER — BENZONATATE 100 MG PO CAPS: 100.0000 mg | ORAL_CAPSULE | Freq: Three times a day (TID) | ORAL | Status: DC | PRN

## 2013-03-24 NOTE — Patient Instructions (Signed)
Augmentin 875mg  Twice daily  W/ food for 7 days  Mucinex DM Twice daily  As needed  Cough/congestion  Fluids and rest .  Prednisone taper over next week.  Please contact office for sooner follow up if symptoms do not improve or worsen or seek emergency care  Follow up Dr. Joya Gaskins  As planned and As needed

## 2013-03-24 NOTE — Progress Notes (Signed)
Subjective:    Patient ID: [name], female    DOB: 11/13/1945, 68 y.o.   MRN: [id number]  HPI  F/U of   68 y.o.    white female with chronic allergic rhinitis and extrinsic asthma. The patient also has associated vocal cord dysfunction syndrome and reflux disease.   01/26/2013 Chief Complaint  Patient presents with  . Asthma    Breathing is doing well. Reports slight cough from time to time. Denies SOB, chest tightness, wheezing.  Pt under stress with grandchild in NICU.  Now min cough.  Dyspnea stable. Last wheezing was three weeks ago.    >>no changes   03/24/2013 Acute OV  Complains of increased SOB, wheezing, chest tightness, prod cough with cream/yellow mucus, chills/sweats, body aches, hoarseness x4-5 days.  Grandkids have been sick with URI.  Taking mucinex without much help.  Arthritis is acting up with sore joints all over. Only on Mobic.  No fever.  Patient denies any hemoptysis, orthopnea, PND, or leg swelling. No recent travel or antibiotic use.  Grandson recent open heart surgery is doing very well.   Past Medical History  Diagnosis Date  . Other and unspecified hyperlipidemia   . Unspecified arthropathy, hand   . Peptic ulcer, unspecified site, unspecified as acute or chronic, without mention of hemorrhage, perforation, or obstruction   . Allergic rhinitis, cause unspecified   . Unspecified asthma(493.90)   . Benign paroxysmal positional vertigo   . Depressive disorder, not elsewhere classified   . Esophageal reflux   . Unspecified hypothyroidism   . Localized osteoarthrosis not specified whether primary or secondary, lower leg   . Other diseases of vocal cords      Family History  Problem Relation Age of Onset  . Heart disease Father   . Colon cancer Mother   . Liver cancer Brother   . Asthma Brother      History   Social History  . Marital Status: Married    Spouse Name: N/A    Number of Children: 3  . Years of Education: N/A    Occupational History  . Retired    Social History Main Topics  . Smoking status: Former Smoker -- 0.30 packs/day for 2 years    Types: Cigarettes    Quit date: 02/12/1958  . Smokeless tobacco: Never Used  . Alcohol Use: Yes     Comment: wine socially  . Drug Use: No  . Sexual Activity: Yes   Other Topics Concern  . Not on file   Social History Narrative  . No narrative on file     Allergies  Allergen Reactions  . Astelin [Azelastine Hcl] Other (See Comments)    Headache  . Morphine     REACTION: Nausea  . Moxifloxacin     REACTION: confusion, dizziness, paranoia     Outpatient Prescriptions Prior to Visit  Medication Sig Dispense Refill  . albuterol (PROVENTIL HFA) 108 (90 BASE) MCG/ACT inhaler Inhale 2 puffs into the lungs every 6 (six) hours as needed.  6.7 g  4  . Biotin (BIOTIN 5000) 5 MG CAPS Take 1 capsule by mouth daily.        . budesonide (PULMICORT FLEXHALER) 180 MCG/ACT inhaler Inhale 2 puffs into the lungs daily.  1 each  3  . buPROPion (WELLBUTRIN XL) 150 MG 24 hr tablet Take 150 mg by mouth daily.        . Calcium Carbonate-Vit D-Min 600-400 MG-UNIT TABS 1 tablet by mouth 2 times daily      .  Cholecalciferol (VITAMIN D3) 2000 UNITS TABS Take 1 tablet by mouth daily.        . clindamycin (CLEOCIN T) 1 % lotion Apply topically 2 (two) times daily as needed.      . clonazePAM (KLONOPIN) 0.5 MG tablet 1/4 by mouth daily as needed      . fexofenadine (ALLEGRA) 180 MG tablet Take 180 mg by mouth at bedtime.      . fluticasone (FLONASE) 50 MCG/ACT nasal spray USE 2 SPRAYS IN EACH NOSTRIL DAILY  16 g  11  . folic acid (FOLVITE) 1 MG tablet Take 1 mg by mouth 2 (two) times daily.        . Ginkgo Biloba 40 MG TABS Take 1 tablet by mouth 3 (three) times daily. As needed      . levothyroxine (SYNTHROID, LEVOTHROID) 75 MCG tablet Take 75 mcg by mouth daily. Except Sunday        . levothyroxine (SYNTHROID, LEVOTHROID) 88 MCG tablet Take only sunday       . LYSINE PO  Take 1 capsule by mouth. Once daily      . meloxicam (MOBIC) 15 MG tablet Take 1 tablet (15 mg total) by mouth every morning.  30 tablet  2  . METROGEL 1 % gel APPLY EVERY DAY  60 g  1  . omeprazole-sodium bicarbonate (ZEGERID) 40-1100 MG per capsule Take 1 capsule by mouth daily before breakfast.        . PARoxetine (PAXIL-CR) 12.5 MG 24 hr tablet Take 12.5 mg by mouth 2 (two) times daily.       . sodium chloride (BRONCHO SALINE) inhaler solution Saline wash twice daily       No facility-administered medications prior to visit.     Review of Systems  Constitutional:   No  weight loss, night sweats,  Fevers, chills, fatigue, lassitude. HEENT:   No headaches,  Difficulty swallowing,  Tooth/dental problems   CV:  No chest pain,  Orthopnea, PND, swelling in lower extremities, anasarca, dizziness, palpitations  GI  No heartburn, indigestion, abdominal pain, nausea, vomiting, diarrhea, change in bowel habits, loss of appetite  Resp:  .       No chest wall deformity  Skin: no rash or lesions.  GU: no dysuria, change in color of urine, no urgency or frequency.  No flank pain.  MS:  No joint pain or swelling.  No decreased range of motion.  No back pain.  Psych:  No change in mood or affect. No depression or anxiety.  No memory loss.     Objective:   Physical Exam BP 120/75  Pulse 87  Temp(Src) 98.8 F (37.1 C) (Oral)  Ht 5\' 3"  (1.6 m)  Wt 178 lb 9.6 oz (81.012 kg)  BMI 31.65 kg/m2  SpO2 97%  Gen: Pleasant, well-nourished, in no distress,  normal affect  ENT: NM pale with clear mucus, no  post nasal drip  Neck: No JVD, no TMG, no carotid bruits  Lungs: No use of accessory muscles, no dullness to percussion, no wheezing noted.   Cardiovascular: RRR, heart sounds normal, no murmur or gallops, no peripheral edema  Abdomen: soft and NT, no HSM,  BS normal  Musculoskeletal: No deformities, no cyanosis or clubbing  Neuro: alert, non focal  Skin: Warm, no lesions or  rashes     Assessment & Plan:   No problem-specific assessment & plan notes found for this encounter.   Updated Medication List Outpatient Encounter Prescriptions as of 03/24/2013  Medication Sig  . albuterol (PROVENTIL HFA) 108 (90 BASE) MCG/ACT inhaler Inhale 2 puffs into the lungs every 6 (six) hours as needed.  . Biotin (BIOTIN 5000) 5 MG CAPS Take 1 capsule by mouth daily.    . budesonide (PULMICORT FLEXHALER) 180 MCG/ACT inhaler Inhale 2 puffs into the lungs daily.  Marland Kitchen buPROPion (WELLBUTRIN XL) 150 MG 24 hr tablet Take 150 mg by mouth daily.    . Calcium Carbonate-Vit D-Min 600-400 MG-UNIT TABS 1 tablet by mouth 2 times daily  . Cholecalciferol (VITAMIN D3) 2000 UNITS TABS Take 1 tablet by mouth daily.    . clindamycin (CLEOCIN T) 1 % lotion Apply topically 2 (two) times daily as needed.  . clonazePAM (KLONOPIN) 0.5 MG tablet 1/4 by mouth daily as needed  . fexofenadine (ALLEGRA) 180 MG tablet Take 180 mg by mouth at bedtime.  . fluticasone (FLONASE) 50 MCG/ACT nasal spray USE 2 SPRAYS IN EACH NOSTRIL DAILY  . folic acid (FOLVITE) 1 MG tablet Take 1 mg by mouth 2 (two) times daily.    . Ginkgo Biloba 40 MG TABS Take 1 tablet by mouth 3 (three) times daily. As needed  . levothyroxine (SYNTHROID, LEVOTHROID) 75 MCG tablet Take 75 mcg by mouth daily. Except Sunday    . levothyroxine (SYNTHROID, LEVOTHROID) 88 MCG tablet Take only sunday   . LYSINE PO Take 1 capsule by mouth. Once daily  . meloxicam (MOBIC) 15 MG tablet Take 1 tablet (15 mg total) by mouth every morning.  Marland Kitchen METROGEL 1 % gel APPLY EVERY DAY  . omeprazole-sodium bicarbonate (ZEGERID) 40-1100 MG per capsule Take 1 capsule by mouth daily before breakfast.    . PARoxetine (PAXIL-CR) 12.5 MG 24 hr tablet Take 12.5 mg by mouth 2 (two) times daily.   . sodium chloride (BRONCHO SALINE) inhaler solution Saline wash twice daily  . amoxicillin-clavulanate (AUGMENTIN) 875-125 MG per tablet Take 1 tablet by mouth 2 (two) times  daily.  . predniSONE (DELTASONE) 10 MG tablet 4 tabs for 2 days, then 3 tabs for 2 days, 2 tabs for 2 days, then 1 tab for 2 days, then stop

## 2013-03-24 NOTE — Telephone Encounter (Signed)
Saw TP 03/24/13: Patient Instructions      Augmentin 875mg  Twice daily  W/ food for 7 days   Mucinex DM Twice daily  As needed  Cough/congestion   Fluids and rest .   Prednisone taper over next week.   Please contact office for sooner follow up if symptoms do not improve or worsen or seek emergency care  Follow up Dr. Joya Gaskins  As planned and As needed  --  I called and spoke with pt. She reports we rx tessalon pearls for her and wants to know if we can call this in for her cough or should she take delsym OTC 1st.  I spoke with TP: She stated we can call in tess pearls 100 mg TID PRN #30 x 1 refill  Called pt back and made aware of recs. RX sent. Nothing further needed

## 2013-03-24 NOTE — Assessment & Plan Note (Signed)
Flare with bronchitis   Plan  Augmentin 875mg  Twice daily  W/ food for 7 days  Mucinex DM Twice daily  As needed  Cough/congestion  Fluids and rest .  Prednisone taper over next week.  Please contact office for sooner follow up if symptoms do not improve or worsen or seek emergency care  Follow up Dr. Joya Gaskins  As planned and As needed

## 2013-03-29 ENCOUNTER — Other Ambulatory Visit: Payer: Self-pay | Admitting: Critical Care Medicine

## 2013-04-03 ENCOUNTER — Encounter: Payer: Self-pay | Admitting: Critical Care Medicine

## 2013-04-03 ENCOUNTER — Ambulatory Visit (INDEPENDENT_AMBULATORY_CARE_PROVIDER_SITE_OTHER): Payer: Medicare Other | Admitting: Critical Care Medicine

## 2013-04-03 VITALS — BP 148/80 | HR 85 | Temp 97.5°F | Ht 63.0 in | Wt 182.4 lb

## 2013-04-03 DIAGNOSIS — J45909 Unspecified asthma, uncomplicated: Secondary | ICD-10-CM | POA: Diagnosis not present

## 2013-04-03 MED ORDER — BUDESONIDE 180 MCG/ACT IN AEPB: 2.0000 | INHALATION_SPRAY | Freq: Two times a day (BID) | RESPIRATORY_TRACT | Status: DC

## 2013-04-03 NOTE — Progress Notes (Signed)
Subjective:    Patient ID: [name], female    DOB: 11-30-1945, 68 y.o.   MRN: [id number]  HPI  F/U of   68 y.o.    white female with chronic allergic rhinitis and extrinsic asthma. The patient also has associated vocal cord dysfunction syndrome and reflux disease.   04/03/2013 Chief Complaint  Patient presents with  . 2 week follow up    Breathing has improved. Persistant cough with mucous production at times with off-white mucous. Complains of DOE and very weak. Denies CP, tightness, wheezing.  Pt states she woke up "not being able to breath."    Just saw NP 04/03/13, Pt is now better. Still with a dry cough.  Pt had hurt all over, temp 101.  Pt is prod of mucus, was green.  Color now creamy to off white.  No real chest pain, is dyspneic and lethargic.  No real sinus drip.  Notes some wheeze  Was rx with pred and augmentin   Past Medical History  Diagnosis Date  . Other and unspecified hyperlipidemia   . Unspecified arthropathy, hand   . Peptic ulcer, unspecified site, unspecified as acute or chronic, without mention of hemorrhage, perforation, or obstruction   . Allergic rhinitis, cause unspecified   . Unspecified asthma(493.90)   . Benign paroxysmal positional vertigo   . Depressive disorder, not elsewhere classified   . Esophageal reflux   . Unspecified hypothyroidism   . Localized osteoarthrosis not specified whether primary or secondary, lower leg   . Other diseases of vocal cords      Family History  Problem Relation Age of Onset  . Heart disease Father   . Colon cancer Mother   . Liver cancer Brother   . Asthma Brother      History   Social History  . Marital Status: Married    Spouse Name: N/A    Number of Children: 3  . Years of Education: N/A   Occupational History  . Retired    Social History Main Topics  . Smoking status: Former Smoker -- 0.30 packs/day for 2 years    Types: Cigarettes    Quit date: 02/12/1958  . Smokeless tobacco: Never Used   . Alcohol Use: Yes     Comment: wine socially  . Drug Use: No  . Sexual Activity: Yes   Other Topics Concern  . Not on file   Social History Narrative  . No narrative on file     Allergies  Allergen Reactions  . Astelin [Azelastine Hcl] Other (See Comments)    Headache  . Morphine     REACTION: Nausea  . Moxifloxacin     REACTION: confusion, dizziness, paranoia     Outpatient Prescriptions Prior to Visit  Medication Sig Dispense Refill  . albuterol (PROVENTIL HFA) 108 (90 BASE) MCG/ACT inhaler Inhale 2 puffs into the lungs every 6 (six) hours as needed.  6.7 g  4  . benzonatate (TESSALON) 100 MG capsule Take 1 capsule (100 mg total) by mouth 3 (three) times daily as needed for cough.  30 capsule  1  . Biotin (BIOTIN 5000) 5 MG CAPS Take 1 capsule by mouth daily.        Marland Kitchen buPROPion (WELLBUTRIN XL) 150 MG 24 hr tablet Take 150 mg by mouth daily.        . Calcium Carbonate-Vit D-Min 600-400 MG-UNIT TABS 1 tablet by mouth 2 times daily      . Cholecalciferol (VITAMIN D3) 2000 UNITS TABS  Take 1 tablet by mouth daily.        . clonazePAM (KLONOPIN) 0.5 MG tablet 1/4 by mouth daily as needed      . fexofenadine (ALLEGRA) 180 MG tablet Take 180 mg by mouth at bedtime.      . fluticasone (FLONASE) 50 MCG/ACT nasal spray USE 2 SPRAYS IN EACH NOSTRIL DAILY  16 g  11  . folic acid (FOLVITE) 1 MG tablet Take 1 mg by mouth 2 (two) times daily.        . Ginkgo Biloba 40 MG TABS Take 1 tablet by mouth 3 (three) times daily. As needed      . levothyroxine (SYNTHROID, LEVOTHROID) 75 MCG tablet Take 75 mcg by mouth daily. Except Sunday        . levothyroxine (SYNTHROID, LEVOTHROID) 88 MCG tablet Take only sunday       . LYSINE PO Take 1 capsule by mouth. Once daily      . meloxicam (MOBIC) 15 MG tablet Take 1 tablet (15 mg total) by mouth every morning.  30 tablet  2  . METROGEL 1 % gel APPLY EVERY DAY  60 g  1  . omeprazole-sodium bicarbonate (ZEGERID) 40-1100 MG per capsule Take 1 capsule  by mouth daily before breakfast.        . PARoxetine (PAXIL-CR) 12.5 MG 24 hr tablet Take 12.5 mg by mouth 2 (two) times daily.       . sodium chloride (BRONCHO SALINE) inhaler solution Saline wash twice daily      . budesonide (PULMICORT FLEXHALER) 180 MCG/ACT inhaler Inhale 2 puffs into the lungs daily.  1 each  3  . clindamycin (CLEOCIN T) 1 % lotion Apply topically 2 (two) times daily as needed.      . budesonide (PULMICORT FLEXHALER) 180 MCG/ACT inhaler Inhale 2 puffs into the lungs daily.  1 each  3  . predniSONE (DELTASONE) 10 MG tablet 4 tabs for 2 days, then 3 tabs for 2 days, 2 tabs for 2 days, then 1 tab for 2 days, then stop  20 tablet  0   No facility-administered medications prior to visit.     Review of Systems  Constitutional:   No  weight loss, night sweats,  Fevers, chills, fatigue, lassitude. HEENT:   No headaches,  Difficulty swallowing,  Tooth/dental problems   CV:  No chest pain,  Orthopnea, PND, swelling in lower extremities, anasarca, dizziness, palpitations  GI  No heartburn, indigestion, abdominal pain, nausea, vomiting, diarrhea, change in bowel habits, loss of appetite  Resp:  .       No chest wall deformity  Skin: no rash or lesions.  GU: no dysuria, change in color of urine, no urgency or frequency.  No flank pain.  MS:  No joint pain or swelling.  No decreased range of motion.  No back pain.  Psych:  No change in mood or affect. No depression or anxiety.  No memory loss.     Objective:   Physical Exam BP 148/80  Pulse 85  Temp(Src) 97.5 F (36.4 C) (Oral)  Ht 5\' 3"  (1.6 m)  Wt 182 lb 6.4 oz (82.736 kg)  BMI 32.32 kg/m2  SpO2 98%  Gen: Pleasant, well-nourished, in no distress,  normal affect  ENT: NM pale with clear mucus, no  post nasal drip  Neck: No JVD, no TMG, no carotid bruits  Lungs: No use of accessory muscles, no dullness to percussion, no wheezing noted.   Cardiovascular: RRR, heart  sounds normal, no murmur or gallops, no  peripheral edema  Abdomen: soft and NT, no HSM,  BS normal  Musculoskeletal: No deformities, no cyanosis or clubbing  Neuro: alert, non focal  Skin: Warm, no lesions or rashes     Assessment & Plan:   Asthma with allergic rhinitis Moderate persistent asthma with allergic features and associated recent flulike illness with exacerbation  No evidence of active ongoing lung infection at this time.  There is increased mild airway inflammation post flu syndrome  Plan Maintain Pulmicort at an increased dose of 2 puff twice daily No other change in medications are indicated at this time    Updated Medication List Outpatient Encounter Prescriptions as of 04/03/2013  Medication Sig  . albuterol (PROVENTIL HFA) 108 (90 BASE) MCG/ACT inhaler Inhale 2 puffs into the lungs every 6 (six) hours as needed.  . benzonatate (TESSALON) 100 MG capsule Take 1 capsule (100 mg total) by mouth 3 (three) times daily as needed for cough.  . Biotin (BIOTIN 5000) 5 MG CAPS Take 1 capsule by mouth daily.    . budesonide (PULMICORT FLEXHALER) 180 MCG/ACT inhaler Inhale 2 puffs into the lungs 2 (two) times daily.  Marland Kitchen buPROPion (WELLBUTRIN XL) 150 MG 24 hr tablet Take 150 mg by mouth daily.    . Calcium Carbonate-Vit D-Min 600-400 MG-UNIT TABS 1 tablet by mouth 2 times daily  . Cholecalciferol (VITAMIN D3) 2000 UNITS TABS Take 1 tablet by mouth daily.    . clonazePAM (KLONOPIN) 0.5 MG tablet 1/4 by mouth daily as needed  . fexofenadine (ALLEGRA) 180 MG tablet Take 180 mg by mouth at bedtime.  . fluticasone (FLONASE) 50 MCG/ACT nasal spray USE 2 SPRAYS IN EACH NOSTRIL DAILY  . folic acid (FOLVITE) 1 MG tablet Take 1 mg by mouth 2 (two) times daily.    . Ginkgo Biloba 40 MG TABS Take 1 tablet by mouth 3 (three) times daily. As needed  . levothyroxine (SYNTHROID, LEVOTHROID) 75 MCG tablet Take 75 mcg by mouth daily. Except Sunday    . levothyroxine (SYNTHROID, LEVOTHROID) 88 MCG tablet Take only sunday   . LYSINE  PO Take 1 capsule by mouth. Once daily  . meloxicam (MOBIC) 15 MG tablet Take 1 tablet (15 mg total) by mouth every morning.  Marland Kitchen METROGEL 1 % gel APPLY EVERY DAY  . omeprazole-sodium bicarbonate (ZEGERID) 40-1100 MG per capsule Take 1 capsule by mouth daily before breakfast.    . PARoxetine (PAXIL-CR) 12.5 MG 24 hr tablet Take 12.5 mg by mouth 2 (two) times daily.   . sodium chloride (BRONCHO SALINE) inhaler solution Saline wash twice daily  . [DISCONTINUED] budesonide (PULMICORT FLEXHALER) 180 MCG/ACT inhaler Inhale 2 puffs into the lungs daily.  . clindamycin (CLEOCIN T) 1 % lotion Apply topically 2 (two) times daily as needed.  . [DISCONTINUED] budesonide (PULMICORT FLEXHALER) 180 MCG/ACT inhaler Inhale 2 puffs into the lungs daily.  . [DISCONTINUED] predniSONE (DELTASONE) 10 MG tablet 4 tabs for 2 days, then 3 tabs for 2 days, 2 tabs for 2 days, then 1 tab for 2 days, then stop

## 2013-04-03 NOTE — Patient Instructions (Signed)
Increase pulmicort two puff twice daily Return 6 weeks

## 2013-04-04 NOTE — Assessment & Plan Note (Signed)
Moderate persistent asthma with allergic features and associated recent flulike illness with exacerbation  No evidence of active ongoing lung infection at this time.  There is increased mild airway inflammation post flu syndrome  Plan Maintain Pulmicort at an increased dose of 2 puff twice daily No other change in medications are indicated at this time

## 2013-04-06 DIAGNOSIS — L820 Inflamed seborrheic keratosis: Secondary | ICD-10-CM | POA: Diagnosis not present

## 2013-04-06 DIAGNOSIS — L578 Other skin changes due to chronic exposure to nonionizing radiation: Secondary | ICD-10-CM | POA: Diagnosis not present

## 2013-04-06 DIAGNOSIS — D1801 Hemangioma of skin and subcutaneous tissue: Secondary | ICD-10-CM | POA: Diagnosis not present

## 2013-04-06 DIAGNOSIS — L819 Disorder of pigmentation, unspecified: Secondary | ICD-10-CM | POA: Diagnosis not present

## 2013-04-06 DIAGNOSIS — Z85828 Personal history of other malignant neoplasm of skin: Secondary | ICD-10-CM | POA: Diagnosis not present

## 2013-04-08 ENCOUNTER — Telehealth: Payer: Self-pay | Admitting: Oncology

## 2013-04-08 NOTE — Telephone Encounter (Signed)
, °

## 2013-04-13 ENCOUNTER — Ambulatory Visit (INDEPENDENT_AMBULATORY_CARE_PROVIDER_SITE_OTHER): Payer: Medicare Other | Admitting: Adult Health

## 2013-04-13 ENCOUNTER — Ambulatory Visit (INDEPENDENT_AMBULATORY_CARE_PROVIDER_SITE_OTHER)
Admission: RE | Admit: 2013-04-13 | Discharge: 2013-04-13 | Disposition: A | Discharge status: Home / Self Care | Payer: Medicare Other | Source: Ambulatory Visit | Attending: Adult Health | Admitting: Adult Health

## 2013-04-13 ENCOUNTER — Encounter: Payer: Self-pay | Admitting: Adult Health

## 2013-04-13 VITALS — BP 134/76 | HR 80 | Temp 99.5°F | Ht 63.0 in | Wt 184.8 lb

## 2013-04-13 DIAGNOSIS — J45909 Unspecified asthma, uncomplicated: Secondary | ICD-10-CM | POA: Diagnosis not present

## 2013-04-13 DIAGNOSIS — R059 Cough, unspecified: Secondary | ICD-10-CM | POA: Diagnosis not present

## 2013-04-13 DIAGNOSIS — R05 Cough: Secondary | ICD-10-CM | POA: Diagnosis not present

## 2013-04-13 DIAGNOSIS — R0602 Shortness of breath: Secondary | ICD-10-CM | POA: Diagnosis not present

## 2013-04-13 MED ORDER — PREDNISONE 10 MG PO TABS: ORAL_TABLET | ORAL | Status: DC

## 2013-04-13 NOTE — Patient Instructions (Addendum)
Mucinex DM Twice daily  As needed  Cough/congestion  Continue on Pulmicort 2 puffs Twice daily   Fluids and rest .  Prednisone taper over next week. -(hold mobic until done)  Please contact office for sooner follow up if symptoms do not improve or worsen or seek emergency care  Follow up Dr. Joya Gaskins  As planned and As needed

## 2013-04-14 NOTE — Progress Notes (Signed)
Subjective:    Patient ID: [name], female    DOB: 20-Feb-1945, 68 y.o.   MRN: [id number]  HPI  F/U of   68 y.o.    white female with chronic allergic rhinitis and extrinsic asthma. The patient also has associated vocal cord dysfunction syndrome and reflux disease.   04/03/2013 Chief Complaint  Patient presents with  . 2 week follow up    Breathing has improved. Persistant cough with mucous production at times with off-white mucous. Complains of DOE and very weak. Denies CP, tightness, wheezing.  Pt states she woke up "not being able to breath."    Just saw NP 04/03/13, Pt is now better. Still with a dry cough.  Pt had hurt all over, temp 101.  Pt is prod of mucus, was green.  Color now creamy to off white.  No real chest pain, is dyspneic and lethargic.  No real sinus drip.  Notes some wheeze  Was rx with pred and augmentin >>increase pulmicort  2 puffs Twice daily    04/13/13 Acute OV  Complains of unresolved cough with creamy mucus, hoarseness, fatigue, increased SOB, wheezing, chest tightness, low grade temp.  Got better but then symptoms worse x5 days with increased cough and wheezing.  Was seen 3 weeks ago, tx w/ augmentin and steroids.  CXR today with no acute process.  She denies any hemoptysis, orthopnea, PND, or leg swelling. Mucus is mainly white.     Past Medical History  Diagnosis Date  . Other and unspecified hyperlipidemia   . Unspecified arthropathy, hand   . Peptic ulcer, unspecified site, unspecified as acute or chronic, without mention of hemorrhage, perforation, or obstruction   . Allergic rhinitis, cause unspecified   . Unspecified asthma(493.90)   . Benign paroxysmal positional vertigo   . Depressive disorder, not elsewhere classified   . Esophageal reflux   . Unspecified hypothyroidism   . Localized osteoarthrosis not specified whether primary or secondary, lower leg   . Other diseases of vocal cords      Family History  Problem Relation Age of  Onset  . Heart disease Father   . Colon cancer Mother   . Liver cancer Brother   . Asthma Brother      History   Social History  . Marital Status: Married    Spouse Name: N/A    Number of Children: 3  . Years of Education: N/A   Occupational History  . Retired    Social History Main Topics  . Smoking status: Former Smoker -- 0.30 packs/day for 2 years    Types: Cigarettes    Quit date: 02/12/1958  . Smokeless tobacco: Never Used  . Alcohol Use: Yes     Comment: wine socially  . Drug Use: No  . Sexual Activity: Yes   Other Topics Concern  . Not on file   Social History Narrative  . No narrative on file     Allergies  Allergen Reactions  . Astelin [Azelastine Hcl] Other (See Comments)    Headache  . Morphine     REACTION: Nausea  . Moxifloxacin     REACTION: confusion, dizziness, paranoia     Outpatient Prescriptions Prior to Visit  Medication Sig Dispense Refill  . albuterol (PROVENTIL HFA) 108 (90 BASE) MCG/ACT inhaler Inhale 2 puffs into the lungs every 6 (six) hours as needed.  6.7 g  4  . benzonatate (TESSALON) 100 MG capsule Take 1 capsule (100 mg total) by mouth 3 (three) times  daily as needed for cough.  30 capsule  1  . Biotin (BIOTIN 5000) 5 MG CAPS Take 1 capsule by mouth daily.        . budesonide (PULMICORT FLEXHALER) 180 MCG/ACT inhaler Inhale 2 puffs into the lungs 2 (two) times daily.  1 each  6  . buPROPion (WELLBUTRIN XL) 150 MG 24 hr tablet Take 150 mg by mouth daily.        . Calcium Carbonate-Vit D-Min 600-400 MG-UNIT TABS 1 tablet by mouth 2 times daily      . Cholecalciferol (VITAMIN D3) 2000 UNITS TABS Take 1 tablet by mouth daily.        . clindamycin (CLEOCIN T) 1 % lotion Apply topically 2 (two) times daily as needed.      . clonazePAM (KLONOPIN) 0.5 MG tablet 1/4 by mouth daily as needed      . fexofenadine (ALLEGRA) 180 MG tablet Take 180 mg by mouth at bedtime.      . fluticasone (FLONASE) 50 MCG/ACT nasal spray USE 2 SPRAYS IN EACH  NOSTRIL DAILY  16 g  11  . folic acid (FOLVITE) 1 MG tablet Take 1 mg by mouth 2 (two) times daily.        . Ginkgo Biloba 40 MG TABS Take 1 tablet by mouth 3 (three) times daily. As needed      . levothyroxine (SYNTHROID, LEVOTHROID) 75 MCG tablet Take 75 mcg by mouth daily. Except Sunday        . levothyroxine (SYNTHROID, LEVOTHROID) 88 MCG tablet Take only sunday       . LYSINE PO Take 1 capsule by mouth. Once daily      . meloxicam (MOBIC) 15 MG tablet Take 1 tablet (15 mg total) by mouth every morning.  30 tablet  2  . METROGEL 1 % gel APPLY EVERY DAY  60 g  1  . omeprazole-sodium bicarbonate (ZEGERID) 40-1100 MG per capsule Take 1 capsule by mouth daily before breakfast.        . PARoxetine (PAXIL-CR) 12.5 MG 24 hr tablet Take 12.5 mg by mouth 2 (two) times daily.       . sodium chloride (BRONCHO SALINE) inhaler solution Saline wash twice daily       No facility-administered medications prior to visit.     Review of Systems  Constitutional:   No  weight loss, night sweats,  + chills, fatigue, lassitude.  HEENT:   No headaches,  Difficulty swallowing,  Tooth/dental problems   CV:  No chest pain,  Orthopnea, PND, swelling in lower extremities, anasarca, dizziness, palpitations  GI  No heartburn, indigestion, abdominal pain, nausea, vomiting, diarrhea, change in bowel habits, loss of appetite  Resp:  .       No chest wall deformity  Skin: no rash or lesions.  GU: no dysuria, change in color of urine, no urgency or frequency.  No flank pain.  MS:  No joint pain or swelling.  No decreased range of motion.  No back pain.  Psych:  No change in mood or affect. No depression or anxiety.  No memory loss.     Objective:   Physical Exam BP 134/76  Pulse 80  Temp(Src) 99.5 F (37.5 C) (Oral)  Ht 5\' 3"  (1.6 m)  Wt 184 lb 12.8 oz (83.825 kg)  BMI 32.74 kg/m2  SpO2 95%  Gen: Pleasant, well-nourished, in no distress,  normal affect  ENT: NM pale with clear mucus, no  post  nasal drip  Neck: No JVD, no TMG, no carotid bruits  Lungs: No use of accessory muscles, no dullness to percussion, faint wheezing noted.   Cardiovascular: RRR, heart sounds normal, no murmur or gallops, no peripheral edema  Abdomen: soft and NT, no HSM,  BS normal  Musculoskeletal: No deformities, no cyanosis or clubbing  Neuro: alert, non focal  Skin : clear w/ no rash   04/13/13 : CXR  The heart size and mediastinal contours are stable. Density at the right cardiophrenic angle is stable, reported to correspond with epicardial fat on CT 07/24/2001 (unavailable). The lungs are clear.      Assessment & Plan:   No problem-specific assessment & plan notes found for this encounter.   Updated Medication List Outpatient Encounter Prescriptions as of 04/13/2013  Medication Sig  . albuterol (PROVENTIL HFA) 108 (90 BASE) MCG/ACT inhaler Inhale 2 puffs into the lungs every 6 (six) hours as needed.  . benzonatate (TESSALON) 100 MG capsule Take 1 capsule (100 mg total) by mouth 3 (three) times daily as needed for cough.  . Biotin (BIOTIN 5000) 5 MG CAPS Take 1 capsule by mouth daily.    . budesonide (PULMICORT FLEXHALER) 180 MCG/ACT inhaler Inhale 2 puffs into the lungs 2 (two) times daily.  Marland Kitchen buPROPion (WELLBUTRIN XL) 150 MG 24 hr tablet Take 150 mg by mouth daily.    . Calcium Carbonate-Vit D-Min 600-400 MG-UNIT TABS 1 tablet by mouth 2 times daily  . Cholecalciferol (VITAMIN D3) 2000 UNITS TABS Take 1 tablet by mouth daily.    . clindamycin (CLEOCIN T) 1 % lotion Apply topically 2 (two) times daily as needed.  . clonazePAM (KLONOPIN) 0.5 MG tablet 1/4 by mouth daily as needed  . fexofenadine (ALLEGRA) 180 MG tablet Take 180 mg by mouth at bedtime.  . fluticasone (FLONASE) 50 MCG/ACT nasal spray USE 2 SPRAYS IN EACH NOSTRIL DAILY  . folic acid (FOLVITE) 1 MG tablet Take 1 mg by mouth 2 (two) times daily.    . Ginkgo Biloba 40 MG TABS Take 1 tablet by mouth 3 (three) times daily. As needed   . levothyroxine (SYNTHROID, LEVOTHROID) 75 MCG tablet Take 75 mcg by mouth daily. Except Sunday    . levothyroxine (SYNTHROID, LEVOTHROID) 88 MCG tablet Take only sunday   . LYSINE PO Take 1 capsule by mouth. Once daily  . meloxicam (MOBIC) 15 MG tablet Take 1 tablet (15 mg total) by mouth every morning.  Marland Kitchen METROGEL 1 % gel APPLY EVERY DAY  . omeprazole-sodium bicarbonate (ZEGERID) 40-1100 MG per capsule Take 1 capsule by mouth daily before breakfast.    . PARoxetine (PAXIL-CR) 12.5 MG 24 hr tablet Take 12.5 mg by mouth 2 (two) times daily.   . sodium chloride (BRONCHO SALINE) inhaler solution Saline wash twice daily  . predniSONE (DELTASONE) 10 MG tablet 4 tabs for 2 days, then 3 tabs for 2 days, 2 tabs for 2 days, then 1 tab for 2 days, then stop

## 2013-04-14 NOTE — Assessment & Plan Note (Signed)
Slow to resolve flare  CXR w/ no acute process.   Plan  Mucinex DM Twice daily  As needed  Cough/congestion  Continue on Pulmicort 2 puffs Twice daily   Fluids and rest .  Prednisone taper over next week. -(hold mobic until done)  Please contact office for sooner follow up if symptoms do not improve or worsen or seek emergency care  Follow up Dr. Joya Gaskins  As planned and As needed

## 2013-04-20 ENCOUNTER — Ambulatory Visit: Payer: Medicare Other | Admitting: Adult Health

## 2013-04-20 ENCOUNTER — Ambulatory Visit: Payer: Medicare Other | Admitting: Oncology

## 2013-04-21 ENCOUNTER — Other Ambulatory Visit: Payer: Self-pay | Admitting: *Deleted

## 2013-04-21 ENCOUNTER — Telehealth: Payer: Self-pay | Admitting: Adult Health

## 2013-04-21 NOTE — Telephone Encounter (Signed)
, °

## 2013-04-22 ENCOUNTER — Telehealth: Payer: Self-pay

## 2013-04-22 NOTE — Telephone Encounter (Signed)
Returned pt call re" purpose of appt".  Advised per last MD note - she is to be seen in 1 year for follow up.  She is ok to make appt - requesting after 3 pm.  POF sent.  Pt knows to expect to hear from scheduling for appt date/time.

## 2013-04-23 ENCOUNTER — Telehealth: Payer: Self-pay | Admitting: Adult Health

## 2013-04-23 DIAGNOSIS — E7800 Pure hypercholesterolemia, unspecified: Secondary | ICD-10-CM | POA: Diagnosis not present

## 2013-04-23 DIAGNOSIS — R7301 Impaired fasting glucose: Secondary | ICD-10-CM | POA: Diagnosis not present

## 2013-04-23 DIAGNOSIS — E559 Vitamin D deficiency, unspecified: Secondary | ICD-10-CM | POA: Diagnosis not present

## 2013-04-23 DIAGNOSIS — E039 Hypothyroidism, unspecified: Secondary | ICD-10-CM | POA: Diagnosis not present

## 2013-04-23 NOTE — Telephone Encounter (Signed)
, °

## 2013-04-29 DIAGNOSIS — R7301 Impaired fasting glucose: Secondary | ICD-10-CM | POA: Diagnosis not present

## 2013-04-29 DIAGNOSIS — E039 Hypothyroidism, unspecified: Secondary | ICD-10-CM | POA: Diagnosis not present

## 2013-04-29 DIAGNOSIS — L659 Nonscarring hair loss, unspecified: Secondary | ICD-10-CM | POA: Diagnosis not present

## 2013-04-29 DIAGNOSIS — E559 Vitamin D deficiency, unspecified: Secondary | ICD-10-CM | POA: Diagnosis not present

## 2013-04-29 DIAGNOSIS — R5381 Other malaise: Secondary | ICD-10-CM | POA: Diagnosis not present

## 2013-04-29 DIAGNOSIS — R5383 Other fatigue: Secondary | ICD-10-CM | POA: Diagnosis not present

## 2013-04-29 DIAGNOSIS — E7800 Pure hypercholesterolemia, unspecified: Secondary | ICD-10-CM | POA: Diagnosis not present

## 2013-05-19 DIAGNOSIS — F429 Obsessive-compulsive disorder, unspecified: Secondary | ICD-10-CM | POA: Diagnosis not present

## 2013-05-19 DIAGNOSIS — F339 Major depressive disorder, recurrent, unspecified: Secondary | ICD-10-CM | POA: Diagnosis not present

## 2013-05-19 DIAGNOSIS — F909 Attention-deficit hyperactivity disorder, unspecified type: Secondary | ICD-10-CM | POA: Diagnosis not present

## 2013-05-21 ENCOUNTER — Ambulatory Visit (HOSPITAL_BASED_OUTPATIENT_CLINIC_OR_DEPARTMENT_OTHER): Payer: Medicare Other | Admitting: Adult Health

## 2013-05-21 ENCOUNTER — Encounter: Payer: Self-pay | Admitting: Adult Health

## 2013-05-21 VITALS — BP 162/90 | HR 83 | Temp 98.4°F | Resp 20 | Ht 63.0 in | Wt 179.3 lb

## 2013-05-21 DIAGNOSIS — N6089 Other benign mammary dysplasias of unspecified breast: Secondary | ICD-10-CM

## 2013-05-21 DIAGNOSIS — D0590 Unspecified type of carcinoma in situ of unspecified breast: Secondary | ICD-10-CM | POA: Diagnosis not present

## 2013-05-21 DIAGNOSIS — N6099 Unspecified benign mammary dysplasia of unspecified breast: Secondary | ICD-10-CM

## 2013-05-21 DIAGNOSIS — R920 Mammographic microcalcification found on diagnostic imaging of breast: Secondary | ICD-10-CM

## 2013-05-21 NOTE — Patient Instructions (Signed)
You are doing well.  Continue with healthy diet, exercise, and self breast exams.  Proceed with your mammogram in July, 2015.  Get a 3d mammogram if available.  Proceed with your Pap smear when scheduled.  We will see you back in one year.  Please call us if you have any questions or concerns.

## 2013-05-21 NOTE — Progress Notes (Signed)
Hematology and Oncology Follow Up Visit  [name] [id number] 1945-05-21 68 y.o. 05/23/2013 8:53 AM     Principle Diagnosis:[name] 68 y.o. female with atypical ductal hyperplasia of the left breast s/p lumpectomy in September 2013.      Prior Therapy: #1 patient presented with an abnormal mammogram and left breast. She had a left breast lumpectomy that showed atypical ductal hyperplasia and lobular carcinoma in situ. She was subsequently seen in the high-risk clinic.   #2 Patient was recommended to take Evista, 60mg  daily.  She took it for one month and reported significant side effects and has since stopped.    Current therapy:  Observations   Interim History: [name] 68 y.o. female who is here for followup for her atypical ductal hyperplasia.  She is doing well today. She is on weight watchers and has lost 6 pounds in 4 weeks.  She did take the Evista for one month and stopped due to side effects including hair thinning, hot flashes and joint aches.  She denies fevers, chills, new pain, breast changes, or any other concerns.    Medications:  Current Outpatient Prescriptions  Medication Sig Dispense Refill  . Biotin (BIOTIN 5000) 5 MG CAPS Take 1 capsule by mouth daily.        Marland Kitchen buPROPion (WELLBUTRIN XL) 150 MG 24 hr tablet Take 150 mg by mouth daily.        . Calcium Carbonate-Vit D-Min 600-400 MG-UNIT TABS 1 tablet by mouth 2 times daily      . Cholecalciferol (VITAMIN D3) 2000 UNITS TABS Take 1 tablet by mouth daily.        . clonazePAM (KLONOPIN) 0.5 MG tablet 1/4 by mouth daily as needed      . fexofenadine (ALLEGRA) 180 MG tablet Take 180 mg by mouth at bedtime.      . fluticasone (FLONASE) 50 MCG/ACT nasal spray USE 2 SPRAYS IN EACH NOSTRIL DAILY  16 g  11  . folic acid (FOLVITE) 1 MG tablet Take 1 mg by mouth 2 (two) times daily.        . Ginkgo Biloba 40 MG TABS Take 1 tablet by mouth 3 (three) times daily. As needed      . levothyroxine (SYNTHROID,  LEVOTHROID) 75 MCG tablet Take 75 mcg by mouth daily. Except Sunday        . levothyroxine (SYNTHROID, LEVOTHROID) 88 MCG tablet Take only sunday       . LYSINE PO Take 1 capsule by mouth. Once daily      . meloxicam (MOBIC) 15 MG tablet Take 1 tablet (15 mg total) by mouth every morning.  30 tablet  2  . METROGEL 1 % gel APPLY EVERY DAY  60 g  1  . omeprazole-sodium bicarbonate (ZEGERID) 40-1100 MG per capsule Take 1 capsule by mouth daily before breakfast.        . PARoxetine (PAXIL-CR) 12.5 MG 24 hr tablet Take 12.5 mg by mouth 2 (two) times daily.       . sodium chloride (BRONCHO SALINE) inhaler solution Saline wash twice daily      . albuterol (PROVENTIL HFA) 108 (90 BASE) MCG/ACT inhaler Inhale 2 puffs into the lungs every 6 (six) hours as needed.  6.7 g  4  . budesonide (PULMICORT FLEXHALER) 180 MCG/ACT inhaler Inhale 2 puffs into the lungs 2 (two) times daily.  1 each  6  . clindamycin (CLEOCIN T) 1 % lotion Apply topically 2 (two) times daily as needed.  No current facility-administered medications for this visit.     Allergies:  Allergies  Allergen Reactions  . Astelin [Azelastine Hcl] Other (See Comments)    Headache  . Morphine     REACTION: Nausea  . Moxifloxacin     REACTION: confusion, dizziness, paranoia    Medical History: Past Medical History  Diagnosis Date  . Other and unspecified hyperlipidemia   . Unspecified arthropathy, hand   . Peptic ulcer, unspecified site, unspecified as acute or chronic, without mention of hemorrhage, perforation, or obstruction   . Allergic rhinitis, cause unspecified   . Unspecified asthma(493.90)   . Benign paroxysmal positional vertigo   . Depressive disorder, not elsewhere classified   . Esophageal reflux   . Unspecified hypothyroidism   . Localized osteoarthrosis not specified whether primary or secondary, lower leg   . Other diseases of vocal cords     Surgical History:  Past Surgical History  Procedure Laterality  Date  . Nasal sinus surgery    . Knee arthroscopy    . Rotator cuff repair       Review of Systems: A 10 point review of systems was conducted and is otherwise negative except for what is noted above.    Health Maintenance  Mammogram: 08/21/12 Colonoscopy:  03/24/12 Bone Density Scan:  08/21/12 Pap Smear: due  Eye Exam: annually Vitamin D Level:  05/13/2012 Lipid Panel:  06/03/2012  Physical Exam: Blood pressure 162/90, pulse 83, temperature 98.4 F (36.9 C), temperature source Oral, resp. rate 20, height 5\' 3"  (1.6 m), weight 179 lb 4.8 oz (81.33 kg). GENERAL: Patient is a well appearing female in no acute distress HEENT:  Sclerae anicteric.  Oropharynx clear and moist. No ulcerations or evidence of oropharyngeal candidiasis. Neck is supple.  NODES:  No cervical, supraclavicular, or axillary lymphadenopathy palpated.  BREAST EXAM:  Left breast lumpectomy without nodularity, no masses, lesions or skin changes in the breast, right breast with no masses, lesions, skin changes, nodules, benign bilateral breast exam.  LUNGS:  Clear to auscultation bilaterally.  No wheezes or rhonchi. HEART:  Regular rate and rhythm. No murmur appreciated. ABDOMEN:  Soft, nontender.  Positive, normoactive bowel sounds. No organomegaly palpated. EXTREMITIES:  No peripheral edema.   SKIN:  Clear with no obvious rashes or skin changes. No nail dyscrasia. NEURO:  Nonfocal. Well oriented.  Appropriate affect. ECOG PERFORMANCE STATUS: 0 - Asymptomatic   Lab Results: Lab Results  Component Value Date   WBC 6.0 01/16/2013   HGB 13.6 01/16/2013   HCT 40.4 01/16/2013   MCV 82.8 01/16/2013   PLT 351 01/16/2013     Chemistry      Component Value Date/Time   NA 139 01/16/2013 1452   NA 143 04/17/2012 1445   K 4.6 01/16/2013 1452   K 3.8 04/17/2012 1445   CL 102 01/16/2013 1452   CL 106 04/17/2012 1445   CO2 30 01/16/2013 1452   CO2 27 04/17/2012 1445   BUN 17 01/16/2013 1452   BUN 15.6 04/17/2012 1445   CREATININE 0.64  01/16/2013 1452   CREATININE 0.8 04/17/2012 1445   CREATININE 0.7 09/18/2006 1225      Component Value Date/Time   CALCIUM 9.5 01/16/2013 1452   CALCIUM 9.2 04/17/2012 1445   ALKPHOS 64 01/16/2013 1452   ALKPHOS 75 04/17/2012 1445   AST 19 01/16/2013 1452   AST 18 04/17/2012 1445   ALT 38* 01/16/2013 1452   ALT 40 04/17/2012 1445   BILITOT 0.3 01/16/2013 1452  BILITOT 0.31 04/17/2012 1445     Assessment and Plan: [name] 68 y.o. female with  1. Atypical ductal hyperplasia of the left breast.  The patient is s/p lumpectomy.  She is doing very well.  She has attempted to take the Evista and stayed on it for one month.  She couldn't tolerate the side effects.    2. Health maintenance. The patient's health maintenance was updated above.  I counseled the patient on survivorship and recommended healthy diet, exercise, and self breast exams.  She will undergo a 3d mammogram in July when her mammogram is due and is scheduled for a pap smear in the next couple of months.  She is doing very well.  The patient will return to clinic in one year.  She knows to contact us in the interim should she have any questions or concerns.  We can certainly see her sooner if needed.    I spent 25 minutes counseling the patient face to face.  The total time spent in the appointment was 30 minutes.  Minette Headland, [place] [phone] 05/23/2013 8:53 AM

## 2013-05-22 ENCOUNTER — Telehealth: Payer: Self-pay | Admitting: Oncology

## 2013-05-22 ENCOUNTER — Ambulatory Visit: Payer: Medicare Other | Admitting: Critical Care Medicine

## 2013-05-22 NOTE — Telephone Encounter (Signed)
s.w.l pt and advised on April 2016 appt....pt ok and aware

## 2013-05-25 DIAGNOSIS — M069 Rheumatoid arthritis, unspecified: Secondary | ICD-10-CM | POA: Diagnosis not present

## 2013-05-25 DIAGNOSIS — M79609 Pain in unspecified limb: Secondary | ICD-10-CM | POA: Diagnosis not present

## 2013-05-25 DIAGNOSIS — M19049 Primary osteoarthritis, unspecified hand: Secondary | ICD-10-CM | POA: Diagnosis not present

## 2013-06-02 DIAGNOSIS — N952 Postmenopausal atrophic vaginitis: Secondary | ICD-10-CM | POA: Diagnosis not present

## 2013-06-02 DIAGNOSIS — N951 Menopausal and female climacteric states: Secondary | ICD-10-CM | POA: Diagnosis not present

## 2013-06-02 DIAGNOSIS — N6489 Other specified disorders of breast: Secondary | ICD-10-CM | POA: Diagnosis not present

## 2013-06-16 ENCOUNTER — Encounter: Payer: Self-pay | Admitting: Cardiology

## 2013-06-16 ENCOUNTER — Ambulatory Visit (INDEPENDENT_AMBULATORY_CARE_PROVIDER_SITE_OTHER): Payer: Medicare Other | Admitting: Cardiology

## 2013-06-16 VITALS — BP 142/84 | HR 84 | Ht 63.0 in | Wt 175.8 lb

## 2013-06-16 DIAGNOSIS — R079 Chest pain, unspecified: Secondary | ICD-10-CM

## 2013-06-16 DIAGNOSIS — R0602 Shortness of breath: Secondary | ICD-10-CM

## 2013-06-16 NOTE — Progress Notes (Signed)
HPI The patient presents for evaluation of chest pain and dyspnea. She reports she saw me several years ago but I don't have these records. She did see Dr. Stanford Breed couple of times from 2010 and 2012. She's had a couple of echoes.  The patient did have stress echocardiogram in 2012.  She  Has not been found to have any ischemia. She had some very mild aortic insufficiency of one point.    She presents for followup of chest discomfort. This has been going on for several months. It is slowly progressive. She is in particular bothered by shortness of breath however. This has been getting worse and is happening with activities such as climbing stairs. She needs to stop at the top of the stairs. She has been more fatigued. She sleeps excessively. The chest discomfort and shortness of breath do not necessarily go together. She thinks she might get short of breath first on occasion and some chest discomfort. She's also had some sharp chest discomfort. She doesn't describe associated arm or neck discomfort. She's not having any nausea vomiting or diaphoresis. She's not having any palpitations, presyncope or syncope. She is concerned because her mother had aortic stenosis and her father had early onset coronary disease. She has had workup for fatigue and I did review some labs and she was not anemic December. She reports a normal TSH recently.  Allergies  Allergen Reactions  . Astelin [Azelastine Hcl] Other (See Comments)    Headache  . Morphine     REACTION: Nausea  . Moxifloxacin     REACTION: confusion, dizziness, paranoia    Current Outpatient Prescriptions  Medication Sig Dispense Refill  . albuterol (PROVENTIL HFA) 108 (90 BASE) MCG/ACT inhaler Inhale 2 puffs into the lungs every 6 (six) hours as needed.  6.7 g  4  . Biotin (BIOTIN 5000) 5 MG CAPS Take 1 capsule by mouth daily.        . budesonide (PULMICORT FLEXHALER) 180 MCG/ACT inhaler Inhale 2 puffs into the lungs 2 (two) times daily.  1 each   6  . buPROPion (WELLBUTRIN XL) 150 MG 24 hr tablet Take 150 mg by mouth daily.        . Calcium Carbonate-Vit D-Min 600-400 MG-UNIT TABS 1 tablet by mouth 2 times daily      . Cholecalciferol (VITAMIN D3) 2000 UNITS TABS Take 1 tablet by mouth daily.        . clindamycin (CLEOCIN T) 1 % lotion Apply topically 2 (two) times daily as needed.      . clonazePAM (KLONOPIN) 0.5 MG tablet 1/4 by mouth daily as needed      . fexofenadine (ALLEGRA) 180 MG tablet Take 180 mg by mouth at bedtime.      . fluticasone (FLONASE) 50 MCG/ACT nasal spray USE 2 SPRAYS IN EACH NOSTRIL DAILY  16 g  11  . folic acid (FOLVITE) 1 MG tablet Take 1 mg by mouth 2 (two) times daily.        . Ginkgo Biloba 40 MG TABS Take 1 tablet by mouth 3 (three) times daily. As needed      . levothyroxine (SYNTHROID, LEVOTHROID) 75 MCG tablet Take 75 mcg by mouth daily. Except Sunday        . levothyroxine (SYNTHROID, LEVOTHROID) 88 MCG tablet Take only sunday       . LYSINE PO Take 1 capsule by mouth. Once daily      . meloxicam (MOBIC) 15 MG tablet Take 1  tablet (15 mg total) by mouth every morning.  30 tablet  2  . METROGEL 1 % gel APPLY EVERY DAY  60 g  1  . omeprazole-sodium bicarbonate (ZEGERID) 40-1100 MG per capsule Take 1 capsule by mouth daily before breakfast.        . PARoxetine (PAXIL-CR) 12.5 MG 24 hr tablet Take 12.5 mg by mouth 2 (two) times daily.       . sodium chloride (BRONCHO SALINE) inhaler solution as needed. Saline wash twice daily       No current facility-administered medications for this visit.    Past Medical History  Diagnosis Date  . Other and unspecified hyperlipidemia   . Unspecified arthropathy, hand   . Peptic ulcer, unspecified site, unspecified as acute or chronic, without mention of hemorrhage, perforation, or obstruction   . Allergic rhinitis, cause unspecified   . Unspecified asthma(493.90)   . Benign paroxysmal positional vertigo   . Depressive disorder, not elsewhere classified   .  Esophageal reflux   . Unspecified hypothyroidism   . Localized osteoarthrosis not specified whether primary or secondary, lower leg   . Other diseases of vocal cords     Past Surgical History  Procedure Laterality Date  . Nasal sinus surgery    . Knee arthroscopy    . Rotator cuff repair      ROS:  As stated in the HPI and negative for all other systems.  PHYSICAL EXAM BP 142/84  Pulse 84  Ht 5\' 3"  (1.6 m)  Wt 175 lb 12.8 oz (79.742 kg)  BMI 31.15 kg/m2 GENERAL:  Well appearing HEENT:  Pupils equal round and reactive, fundi not visualized, oral mucosa unremarkable NECK:  No jugular venous distention, waveform within normal limits, carotid upstroke brisk and symmetric, no bruits, no thyromegaly LYMPHATICS:  No cervical, inguinal adenopathy LUNGS:  Clear to auscultation bilaterally BACK:  No CVA tenderness CHEST:  Unremarkable HEART:  PMI not displaced or sustained,S1 and S2 within normal limits, no S3, no S4, no clicks, no rubs, no murmurs ABD:  Flat, positive bowel sounds normal in frequency in pitch, no bruits, no rebound, no guarding, no midline pulsatile mass, no hepatomegaly, no splenomegaly EXT:  2 plus pulses throughout, no edema, no cyanosis no clubbing SKIN:  No rashes no nodules NEURO:  Cranial nerves II through XII grossly intact, motor grossly intact throughout PSYCH:  Cognitively intact, oriented to person place and time   EKG:   Sinus rhythm, rate 78, left axis deviation , incomplete right bundle branch block, no acute ST-T wave changes.  06/16/2013   ASSESSMENT AND PLAN  DYSPNEA:   I will start with a BNP level and an echocardiogram. Further evaluation and management will be based on these results.  CHEST PAIN:   She does have cardiovascular risk factors. Pending the results of the above testing I will consider stress perfusion imaging. She thinks she would be able to walk.

## 2013-06-16 NOTE — Patient Instructions (Signed)
The current medical regimen is effective;  continue present plan and medications.  Your physician has requested that you have an echocardiogram. Echocardiography is a painless test that uses sound waves to create images of your heart. It provides your doctor with information about the size and shape of your heart and how well your heart's chambers and valves are working. This procedure takes approximately one hour. There are no restrictions for this procedure.  Please have blood work today (BNP)  Follow up in 2 months with Dr Percival Spanish.

## 2013-06-22 ENCOUNTER — Ambulatory Visit (INDEPENDENT_AMBULATORY_CARE_PROVIDER_SITE_OTHER): Payer: Medicare Other | Admitting: *Deleted

## 2013-06-22 DIAGNOSIS — R0602 Shortness of breath: Secondary | ICD-10-CM | POA: Diagnosis not present

## 2013-06-22 LAB — BRAIN NATRIURETIC PEPTIDE: Pro B Natriuretic peptide (BNP): 21 pg/mL (ref 0.0–100.0)

## 2013-06-23 ENCOUNTER — Ambulatory Visit (HOSPITAL_COMMUNITY)
Admission: RE | Admit: 2013-06-23 | Discharge: 2013-06-23 | Disposition: A | Discharge status: Home / Self Care | Payer: Medicare Other | Source: Ambulatory Visit | Attending: Cardiology | Admitting: Cardiology

## 2013-06-23 DIAGNOSIS — R0989 Other specified symptoms and signs involving the circulatory and respiratory systems: Principal | ICD-10-CM | POA: Insufficient documentation

## 2013-06-23 DIAGNOSIS — R0602 Shortness of breath: Secondary | ICD-10-CM

## 2013-06-23 DIAGNOSIS — I517 Cardiomegaly: Secondary | ICD-10-CM

## 2013-06-23 DIAGNOSIS — R0609 Other forms of dyspnea: Secondary | ICD-10-CM | POA: Insufficient documentation

## 2013-06-23 NOTE — Progress Notes (Signed)
2D Echocardiogram Complete.  06/23/2013   Staysha Truby, RDCS 

## 2013-06-24 ENCOUNTER — Telehealth: Payer: Self-pay | Admitting: Cardiology

## 2013-06-24 NOTE — Telephone Encounter (Signed)
New message ° ° ° °Patient calling for echo results °

## 2013-06-24 NOTE — Telephone Encounter (Signed)
Spoke with pt who is aware Dr Percival Spanish has not reviewed by echo results yet (she had the testing completed yesterday).  Aware that once he reviews I will call her back.  She wants to make Dr Percival Spanish aware she never told him she has Factor V Leiden and that her father died from a PE.  Information was noted on the echo results for his knowledge.

## 2013-06-26 ENCOUNTER — Other Ambulatory Visit: Payer: Medicare Other

## 2013-06-26 ENCOUNTER — Encounter: Payer: Self-pay | Admitting: Adult Health

## 2013-06-26 ENCOUNTER — Ambulatory Visit (INDEPENDENT_AMBULATORY_CARE_PROVIDER_SITE_OTHER): Payer: Medicare Other | Admitting: Adult Health

## 2013-06-26 VITALS — BP 130/82 | HR 76 | Temp 97.9°F | Ht 63.0 in | Wt 176.8 lb

## 2013-06-26 DIAGNOSIS — R0600 Dyspnea, unspecified: Secondary | ICD-10-CM | POA: Insufficient documentation

## 2013-06-26 DIAGNOSIS — R0609 Other forms of dyspnea: Secondary | ICD-10-CM

## 2013-06-26 DIAGNOSIS — J45909 Unspecified asthma, uncomplicated: Secondary | ICD-10-CM

## 2013-06-26 DIAGNOSIS — R0989 Other specified symptoms and signs involving the circulatory and respiratory systems: Secondary | ICD-10-CM

## 2013-06-26 LAB — D-DIMER, QUANTITATIVE (NOT AT ARMC): D DIMER QUANT: 0.39 ug{FEU}/mL (ref 0.00–0.48)

## 2013-06-26 MED ORDER — AZITHROMYCIN 250 MG PO TABS: ORAL_TABLET | ORAL | Status: AC

## 2013-06-26 MED ORDER — PREDNISONE 10 MG PO TABS: ORAL_TABLET | ORAL | Status: DC

## 2013-06-26 NOTE — Progress Notes (Signed)
Subjective:    Patient ID: [name], female    DOB: 1946-01-29, 68 y.o.   MRN: [id number]  HPI  F/U of   68 y.o.    white female with chronic allergic rhinitis and extrinsic asthma. The patient also has associated vocal cord dysfunction syndrome and reflux disease.   04/03/2013 Chief Complaint  Patient presents with  . 2 week follow up    Breathing has improved. Persistant cough with mucous production at times with off-white mucous. Complains of DOE and very weak. Denies CP, tightness, wheezing.  Pt states she woke up "not being able to breath."    Just saw NP 04/03/13, Pt is now better. Still with a dry cough.  Pt had hurt all over, temp 101.  Pt is prod of mucus, was green.  Color now creamy to off white.  No real chest pain, is dyspneic and lethargic.  No real sinus drip.  Notes some wheeze  Was rx with pred and augmentin >>increase pulmicort  2 puffs Twice daily    04/13/13 Acute OV  Complains of unresolved cough with creamy mucus, hoarseness, fatigue, increased SOB, wheezing, chest tightness, low grade temp.  Got better but then symptoms worse x5 days with increased cough and wheezing.  Was seen 3 weeks ago, tx w/ augmentin and steroids.  CXR today with no acute process.  She denies any hemoptysis, orthopnea, PND, or leg swelling. Mucus is mainly white.  >pred taper    06/26/2013 Acute OV  Complains of cough w/ creamy yellow mucus, wheezing, sob and tightness in chest for 1 week.  Complains that over last year she has had progressive dyspnea with activity . Seen by cards and underwent echo 5/12 w/ EF at 60%, mild LA dilatation. BNP nml  Has occasional chest pain . Has follow up with card to decide on stess myoview. No syncope or palpitations.   Concerned she may have PE as her father died with PE. She has had DVT 20 yrs ago denies leg swelling or calf pain. She believes she was told she had a Factor V Leiden mutation.  No desats with ambulation.  Spirometry today shows poor  attempts w/ frequent cough,  FEV1 95%, ratio 63 , FVC 117%  Patient denies any hemoptysis, orthopnea, PND, or leg swelling.     Past Medical History  Diagnosis Date  . Other and unspecified hyperlipidemia   . Unspecified arthropathy, hand   . Peptic ulcer, unspecified site, unspecified as acute or chronic, without mention of hemorrhage, perforation, or obstruction   . Allergic rhinitis, cause unspecified   . Unspecified asthma(493.90)   . Benign paroxysmal positional vertigo   . Depressive disorder, not elsewhere classified   . Esophageal reflux   . Unspecified hypothyroidism   . Localized osteoarthrosis not specified whether primary or secondary, lower leg   . Other diseases of vocal cords      Family History  Problem Relation Age of Onset  . Heart disease Father   . Colon cancer Mother   . Liver cancer Brother   . Asthma Brother      History   Social History  . Marital Status: Married    Spouse Name: N/A    Number of Children: 3  . Years of Education: N/A   Occupational History  . Retired    Social History Main Topics  . Smoking status: Former Smoker -- 0.30 packs/day for 2 years    Types: Cigarettes    Quit date: 02/12/1958  .  Smokeless tobacco: Never Used  . Alcohol Use: Yes     Comment: wine socially  . Drug Use: No  . Sexual Activity: Yes   Other Topics Concern  . Not on file   Social History Narrative  . No narrative on file     Allergies  Allergen Reactions  . Astelin [Azelastine Hcl] Other (See Comments)    Headache  . Morphine     REACTION: Nausea  . Moxifloxacin     REACTION: confusion, dizziness, paranoia     Outpatient Prescriptions Prior to Visit  Medication Sig Dispense Refill  . albuterol (PROVENTIL HFA) 108 (90 BASE) MCG/ACT inhaler Inhale 2 puffs into the lungs every 6 (six) hours as needed.  6.7 g  4  . Biotin (BIOTIN 5000) 5 MG CAPS Take 1 capsule by mouth daily.        . budesonide (PULMICORT FLEXHALER) 180 MCG/ACT inhaler  Inhale 2 puffs into the lungs 2 (two) times daily.  1 each  6  . buPROPion (WELLBUTRIN XL) 150 MG 24 hr tablet Take 150 mg by mouth daily.        . Calcium Carbonate-Vit D-Min 600-400 MG-UNIT TABS 1 tablet by mouth 2 times daily      . Cholecalciferol (VITAMIN D3) 2000 UNITS TABS Take 1 tablet by mouth daily.        . clindamycin (CLEOCIN T) 1 % lotion Apply topically 2 (two) times daily as needed.      . clonazePAM (KLONOPIN) 0.5 MG tablet 1/4 by mouth daily as needed      . fexofenadine (ALLEGRA) 180 MG tablet Take 180 mg by mouth at bedtime.      . fluticasone (FLONASE) 50 MCG/ACT nasal spray USE 2 SPRAYS IN EACH NOSTRIL DAILY  16 g  11  . folic acid (FOLVITE) 1 MG tablet Take 1 mg by mouth 2 (two) times daily.        . Ginkgo Biloba 40 MG TABS Take 1 tablet by mouth 3 (three) times daily. As needed      . levothyroxine (SYNTHROID, LEVOTHROID) 75 MCG tablet Take 75 mcg by mouth daily. Except Sunday        . levothyroxine (SYNTHROID, LEVOTHROID) 88 MCG tablet Take only sunday       . LYSINE PO Take 1 capsule by mouth. Once daily      . meloxicam (MOBIC) 15 MG tablet Take 1 tablet (15 mg total) by mouth every morning.  30 tablet  2  . METROGEL 1 % gel APPLY EVERY DAY  60 g  1  . omeprazole-sodium bicarbonate (ZEGERID) 40-1100 MG per capsule Take 1 capsule by mouth daily before breakfast.        . PARoxetine (PAXIL-CR) 12.5 MG 24 hr tablet Take 12.5 mg by mouth 2 (two) times daily.       . sodium chloride (BRONCHO SALINE) inhaler solution as needed. Saline wash twice daily       No facility-administered medications prior to visit.     Review of Systems  Constitutional:   No  weight loss, night sweats,  + chills, fatigue, lassitude.  HEENT:   No headaches,  Difficulty swallowing,  Tooth/dental problems   CV:  No chest pain,  Orthopnea, PND, swelling in lower extremities, anasarca, dizziness, palpitations  GI  No heartburn, indigestion, abdominal pain, nausea, vomiting, diarrhea,  change in bowel habits, loss of appetite  Resp:  .       No chest wall deformity  Skin:  no rash or lesions.  GU: no dysuria, change in color of urine, no urgency or frequency.  No flank pain.  MS:  No joint pain or swelling.  No decreased range of motion.  No back pain.  Psych:  No change in mood or affect. No depression or anxiety.  No memory loss.     Objective:   Physical Exam BP 130/82  Pulse 76  Temp(Src) 97.9 F (36.6 C) (Oral)  Ht 5\' 3"  (1.6 m)  Wt 176 lb 12.8 oz (80.196 kg)  BMI 31.33 kg/m2  SpO2 98%  Gen: Pleasant, well-nourished, in no distress,  normal affect  ENT: NM pale with clear mucus, no  post nasal drip  Neck: No JVD, no TMG, no carotid bruits  Lungs: No use of accessory muscles, no dullness to percussion, faint wheezing noted.   Cardiovascular: RRR, heart sounds normal, no murmur or gallops, no peripheral edema  Abdomen: soft and NT, no HSM,  BS normal  Musculoskeletal: No deformities, no cyanosis or clubbing  Neuro: alert, non focal  Skin : clear w/ no rash   04/13/13 : CXR  The heart size and mediastinal contours are stable. Density at the right cardiophrenic angle is stable, reported to correspond with epicardial fat on CT 07/24/2001 (unavailable). The lungs are clear.      Assessment & Plan:   No problem-specific assessment & plan notes found for this encounter.   Updated Medication List Outpatient Encounter Prescriptions as of 06/26/2013  Medication Sig  . albuterol (PROVENTIL HFA) 108 (90 BASE) MCG/ACT inhaler Inhale 2 puffs into the lungs every 6 (six) hours as needed.  . Biotin (BIOTIN 5000) 5 MG CAPS Take 1 capsule by mouth daily.    . budesonide (PULMICORT FLEXHALER) 180 MCG/ACT inhaler Inhale 2 puffs into the lungs 2 (two) times daily.  Marland Kitchen buPROPion (WELLBUTRIN XL) 150 MG 24 hr tablet Take 150 mg by mouth daily.    . Calcium Carbonate-Vit D-Min 600-400 MG-UNIT TABS 1 tablet by mouth 2 times daily  . Cholecalciferol (VITAMIN D3)  2000 UNITS TABS Take 1 tablet by mouth daily.    . clindamycin (CLEOCIN T) 1 % lotion Apply topically 2 (two) times daily as needed.  . clonazePAM (KLONOPIN) 0.5 MG tablet 1/4 by mouth daily as needed  . fexofenadine (ALLEGRA) 180 MG tablet Take 180 mg by mouth at bedtime.  . fluticasone (FLONASE) 50 MCG/ACT nasal spray USE 2 SPRAYS IN EACH NOSTRIL DAILY  . folic acid (FOLVITE) 1 MG tablet Take 1 mg by mouth 2 (two) times daily.    . Ginkgo Biloba 40 MG TABS Take 1 tablet by mouth 3 (three) times daily. As needed  . levothyroxine (SYNTHROID, LEVOTHROID) 75 MCG tablet Take 75 mcg by mouth daily. Except Sunday    . levothyroxine (SYNTHROID, LEVOTHROID) 88 MCG tablet Take only sunday   . LYSINE PO Take 1 capsule by mouth. Once daily  . meloxicam (MOBIC) 15 MG tablet Take 1 tablet (15 mg total) by mouth every morning.  Marland Kitchen METROGEL 1 % gel APPLY EVERY DAY  . omeprazole-sodium bicarbonate (ZEGERID) 40-1100 MG per capsule Take 1 capsule by mouth daily before breakfast.    . PARoxetine (PAXIL-CR) 12.5 MG 24 hr tablet Take 12.5 mg by mouth 2 (two) times daily.   . sodium chloride (BRONCHO SALINE) inhaler solution as needed. Saline wash twice daily

## 2013-06-26 NOTE — Assessment & Plan Note (Addendum)
Flare with URI   Plan  Mucinex  DM  Twice daily  As needed  Cough/congestion  Increase Pulmicort 2 puffs Twice daily   Fluids and rest .  Prednisone taper over next week. -(hold mobic until done)  Zpack take as directed.  Labs today .  Please contact office for sooner follow up if symptoms do not improve or worsen or seek emergency care  Follow up Dr. Joya Gaskins  As planned and As needed

## 2013-06-26 NOTE — Patient Instructions (Signed)
Mucinex  DM  Twice daily  As needed  Cough/congestion  Increase Pulmicort 2 puffs Twice daily   Fluids and rest .  Prednisone taper over next week. -(hold mobic until done)  Zpack take as directed.  Labs today .  Please contact office for sooner follow up if symptoms do not improve or worsen or seek emergency care  Follow up Dr. Joya Gaskins  As planned and As needed

## 2013-06-26 NOTE — Assessment & Plan Note (Addendum)
Chronic Dyspnea x 1 year  ? Etiology , possilble underlying asthma component +/- VCD w/ deconditioning  Recent cxr w/ no acute process Ambulatory office walk without desats Spirometry (although poor efforts ) w/ nml FEV1 min obstruction  Cardiology eval ongoing w/ nml echo and bnp Will check D. Dimer if possible consider CT angio   Plan  Mucinex  DM  Twice daily  As needed  Cough/congestion  Increase Pulmicort 2 puffs Twice daily   Fluids and rest .  Prednisone taper over next week. -(hold mobic until done)  Zpack take as directed.  Labs today .  Please contact office for sooner follow up if symptoms do not improve or worsen or seek emergency care  Follow up Dr. Joya Gaskins  As planned and As needed

## 2013-06-30 ENCOUNTER — Telehealth: Payer: Self-pay | Admitting: Adult Health

## 2013-06-30 NOTE — Progress Notes (Signed)
Quick Note:  LM w/ spouse - will call back. ______

## 2013-06-30 NOTE — Telephone Encounter (Signed)
LMOM x 1  Notes Recorded by Melvenia Needles, NP on 06/28/2013 at 4:23 PM D dimer is neg unlikely PE

## 2013-06-30 NOTE — Progress Notes (Signed)
Quick Note:  See 5.19.15 phone note ______

## 2013-07-01 ENCOUNTER — Telehealth: Payer: Self-pay | Admitting: Cardiology

## 2013-07-01 DIAGNOSIS — R0989 Other specified symptoms and signs involving the circulatory and respiratory systems: Principal | ICD-10-CM

## 2013-07-01 DIAGNOSIS — R0609 Other forms of dyspnea: Secondary | ICD-10-CM

## 2013-07-01 NOTE — Telephone Encounter (Signed)
Pt advised. Arilynn Blakeney, CMA  

## 2013-07-01 NOTE — Telephone Encounter (Signed)
Patient would like results of Echo, please call and advise.

## 2013-07-01 NOTE — Telephone Encounter (Signed)
Left message to call back  

## 2013-07-03 ENCOUNTER — Ambulatory Visit: Payer: Medicare Other | Admitting: Critical Care Medicine

## 2013-07-03 NOTE — Telephone Encounter (Signed)
Pt aware of results and need for further testing.  Order placed and she is aware someone will call her to schedule.

## 2013-07-10 ENCOUNTER — Encounter: Payer: Self-pay | Admitting: Critical Care Medicine

## 2013-07-10 ENCOUNTER — Ambulatory Visit (INDEPENDENT_AMBULATORY_CARE_PROVIDER_SITE_OTHER): Payer: Medicare Other | Admitting: Critical Care Medicine

## 2013-07-10 VITALS — BP 122/80 | HR 76 | Temp 98.4°F | Ht 63.0 in | Wt 176.0 lb

## 2013-07-10 DIAGNOSIS — J45909 Unspecified asthma, uncomplicated: Secondary | ICD-10-CM

## 2013-07-10 MED ORDER — BUDESONIDE 180 MCG/ACT IN AEPB: 2.0000 | INHALATION_SPRAY | Freq: Two times a day (BID) | RESPIRATORY_TRACT | Status: DC

## 2013-07-10 NOTE — Progress Notes (Signed)
Subjective:    Patient ID: [name], female    DOB: [birth year], 68 y.o.   MRN: [id number]  HPI F/U of   68 y.o.    white female with chronic allergic rhinitis and extrinsic asthma. The patient also has associated vocal cord dysfunction syndrome and reflux disease.    5/15 Acute OV  Complains of cough w/ creamy yellow mucus, wheezing, sob and tightness in chest for 1 week.  Complains that over last year she has had progressive dyspnea with activity . Seen by cards and underwent echo 5/12 w/ EF at 60%, mild LA dilatation. BNP nml  Has occasional chest pain . Has follow up with card to decide on stess myoview. No syncope or palpitations.   Concerned she may have PE as her father died with PE. She has had DVT 20 yrs ago denies leg swelling or calf pain. She believes she was told she had a Factor V Leiden mutation.  No desats with ambulation.  Spirometry today shows poor attempts w/ frequent cough,  FEV1 95%, ratio 63 , FVC 117%  Patient denies any hemoptysis, orthopnea, PND, or leg swelling.   07/10/2013 Chief Complaint  Patient presents with  . Asthma    Breahting is unchanged. Still reports SOB but states this is normal for her.  pt was worse,  PFTs normal.  Pt was coughing and wheezing.  Rx zpak and pred and pulmicort increased D Dimer normal No change in medications.No leg swelling.    PUL ASTHMA HISTORY 07/11/2013 07/01/2012 11/07/2011  Symptoms Daily 0-2 days/week 0-2 days/week  Nighttime awakenings 0-2/month 0-2/month 0-2/month  Interference with activity Some limitations No limitations No limitations  SABA use > 2 days/wk--not > 1 x/day 0-2 days/wk 0-2 days/wk  Exacerbations requiring oral steroids 0-1 / year 0-1 / year 0-1 / year   Review of Systems Constitutional:   No  weight loss, night sweats,  + chills, fatigue, lassitude.  HEENT:   No headaches,  Difficulty swallowing,  Tooth/dental problems   CV:  No chest pain,  Orthopnea, PND, swelling in lower extremities,  anasarca, dizziness, palpitations  GI  No heartburn, indigestion, abdominal pain, nausea, vomiting, diarrhea, change in bowel habits, loss of appetite  Resp:  .       No chest wall deformity  Skin: no rash or lesions.  GU: no dysuria, change in color of urine, no urgency or frequency.  No flank pain.  MS:  No joint pain or swelling.  No decreased range of motion.  No back pain.  Psych:  No change in mood or affect. No depression or anxiety.  No memory loss.     Objective:   Physical Exam BP 122/80  Pulse 76  Temp(Src) 98.4 F (36.9 C) (Oral)  Ht 5\' 3"  (1.6 m)  Wt 176 lb (79.833 kg)  BMI 31.18 kg/m2  SpO2 98%  Gen: Pleasant, well-nourished, in no distress,  normal affect  ENT: NM pale with clear mucus, no  post nasal drip  Neck: No JVD, no TMG, no carotid bruits  Lungs: No use of accessory muscles, no dullness to percussion, faint wheezing noted.   Cardiovascular: RRR, heart sounds normal, no murmur or gallops, no peripheral edema  Abdomen: soft and NT, no HSM,  BS normal  Musculoskeletal: No deformities, no cyanosis or clubbing  Neuro: alert, non focal  Skin : clear w/ no rash       Assessment & Plan:   Asthma with allergic rhinitis Moderate persistent asthma stable at  present, but needing higher dose inhaled steroid Plan Increase pulmicort two puff twice daily Measure peak flow rate twice daily, call us with a series of results Return 2 months     Updated Medication List Outpatient Encounter Prescriptions as of 07/10/2013  Medication Sig  . albuterol (PROVENTIL HFA) 108 (90 BASE) MCG/ACT inhaler Inhale 2 puffs into the lungs every 6 (six) hours as needed.  . Biotin (BIOTIN 5000) 5 MG CAPS Take 1 capsule by mouth daily.    . budesonide (PULMICORT) 180 MCG/ACT inhaler Inhale 2 puffs into the lungs 2 (two) times daily.  Marland Kitchen buPROPion (WELLBUTRIN XL) 150 MG 24 hr tablet Take 150 mg by mouth daily.    . Calcium Carbonate-Vit D-Min 600-400 MG-UNIT TABS 1  tablet by mouth 2 times daily  . Cholecalciferol (VITAMIN D3) 2000 UNITS TABS Take 1 tablet by mouth daily.    . clindamycin (CLEOCIN T) 1 % lotion Apply topically 2 (two) times daily as needed.  . clonazePAM (KLONOPIN) 0.5 MG tablet 1/4 by mouth daily as needed  . fexofenadine (ALLEGRA) 180 MG tablet Take 180 mg by mouth at bedtime.  . fluticasone (FLONASE) 50 MCG/ACT nasal spray USE 2 SPRAYS IN EACH NOSTRIL DAILY PRN  . folic acid (FOLVITE) 1 MG tablet Take 1 mg by mouth 2 (two) times daily.    . Ginkgo Biloba 40 MG TABS Take 1 tablet by mouth 3 (three) times daily. As needed  . glucosamine-chondroitin 500-400 MG tablet Take 1 tablet by mouth daily.  Marland Kitchen levothyroxine (SYNTHROID, LEVOTHROID) 75 MCG tablet Take 75 mcg by mouth daily. Except Sunday    . levothyroxine (SYNTHROID, LEVOTHROID) 88 MCG tablet Take only sunday   . LYSINE PO Take 1 capsule by mouth. Once daily  . meloxicam (MOBIC) 15 MG tablet Take 1 tablet (15 mg total) by mouth every morning.  Marland Kitchen METROGEL 1 % gel APPLY EVERY DAY  . omeprazole-sodium bicarbonate (ZEGERID) 40-1100 MG per capsule Take 1 capsule by mouth daily before breakfast.    . PARoxetine (PAXIL-CR) 12.5 MG 24 hr tablet Take 12.5 mg by mouth 2 (two) times daily.   . sodium chloride (BRONCHO SALINE) inhaler solution as needed. Saline wash twice daily  . vitamin C (ASCORBIC ACID) 500 MG tablet Take 500 mg by mouth daily.  . [DISCONTINUED] budesonide (PULMICORT FLEXHALER) 180 MCG/ACT inhaler Inhale 2 puffs into the lungs 2 (two) times daily.  . [DISCONTINUED] budesonide (PULMICORT) 180 MCG/ACT inhaler Inhale 2 puffs into the lungs daily.  . [DISCONTINUED] fluticasone (FLONASE) 50 MCG/ACT nasal spray USE 2 SPRAYS IN EACH NOSTRIL DAILY  . [DISCONTINUED] predniSONE (DELTASONE) 10 MG tablet 4 tabs for 2 days, then 3 tabs for 2 days, 2 tabs for 2 days, then 1 tab for 2 days, then stop

## 2013-07-10 NOTE — Patient Instructions (Signed)
Increase pulmicort two puff twice daily Measure peak flow rate twice daily, call us with a series of results Return 2 months

## 2013-07-11 NOTE — Assessment & Plan Note (Signed)
Moderate persistent asthma stable at present, but needing higher dose inhaled steroid Plan Increase pulmicort two puff twice daily Measure peak flow rate twice daily, call us with a series of results Return 2 months

## 2013-07-14 DIAGNOSIS — F909 Attention-deficit hyperactivity disorder, unspecified type: Secondary | ICD-10-CM | POA: Diagnosis not present

## 2013-07-14 DIAGNOSIS — F339 Major depressive disorder, recurrent, unspecified: Secondary | ICD-10-CM | POA: Diagnosis not present

## 2013-07-14 DIAGNOSIS — F429 Obsessive-compulsive disorder, unspecified: Secondary | ICD-10-CM | POA: Diagnosis not present

## 2013-07-15 ENCOUNTER — Other Ambulatory Visit: Payer: Self-pay | Admitting: *Deleted

## 2013-07-15 MED ORDER — MELOXICAM 15 MG PO TABS: 15.0000 mg | ORAL_TABLET | Freq: Every morning | ORAL | Status: DC

## 2013-07-20 ENCOUNTER — Ambulatory Visit (HOSPITAL_COMMUNITY): Payer: Medicare Other | Attending: Cardiology | Admitting: Radiology

## 2013-07-20 VITALS — BP 147/77 | Ht 63.0 in | Wt 173.0 lb

## 2013-07-20 DIAGNOSIS — Z87891 Personal history of nicotine dependence: Secondary | ICD-10-CM | POA: Diagnosis not present

## 2013-07-20 DIAGNOSIS — Z8249 Family history of ischemic heart disease and other diseases of the circulatory system: Secondary | ICD-10-CM | POA: Diagnosis not present

## 2013-07-20 DIAGNOSIS — R0789 Other chest pain: Secondary | ICD-10-CM | POA: Diagnosis not present

## 2013-07-20 DIAGNOSIS — J45909 Unspecified asthma, uncomplicated: Secondary | ICD-10-CM | POA: Insufficient documentation

## 2013-07-20 DIAGNOSIS — I4510 Unspecified right bundle-branch block: Secondary | ICD-10-CM | POA: Diagnosis not present

## 2013-07-20 DIAGNOSIS — R0609 Other forms of dyspnea: Secondary | ICD-10-CM

## 2013-07-20 DIAGNOSIS — R0602 Shortness of breath: Secondary | ICD-10-CM | POA: Insufficient documentation

## 2013-07-20 DIAGNOSIS — R0989 Other specified symptoms and signs involving the circulatory and respiratory systems: Secondary | ICD-10-CM

## 2013-07-20 MED ORDER — TECHNETIUM TC 99M SESTAMIBI GENERIC - CARDIOLITE
33.0000 | Freq: Once | INTRAVENOUS | Status: AC | PRN
  Administered 2013-07-20: 33 via INTRAVENOUS

## 2013-07-20 MED ORDER — REGADENOSON 0.4 MG/5ML IV SOLN
0.4000 mg | Freq: Once | INTRAVENOUS | Status: AC
  Administered 2013-07-20: 0.4 mg via INTRAVENOUS

## 2013-07-20 MED ORDER — TECHNETIUM TC 99M SESTAMIBI GENERIC - CARDIOLITE
11.0000 | Freq: Once | INTRAVENOUS | Status: AC | PRN
  Administered 2013-07-20: 11 via INTRAVENOUS

## 2013-07-20 NOTE — Progress Notes (Signed)
[facility] [address] [place],  [address] [phone]    Cardiology Nuclear Med Study  [name] is a 68 y.o. female     MRN : [id number]     DOB: 07-09-1945  Procedure Date: 07/20/2013  Nuclear Med Background Indication for Stress Test:  Evaluation for Ischemia History:  Asthma and No H/O CAD, '04 MPI: EF: 65% NL 06/23/13 EF: 60-65% Cardiac Risk Factors: Family History - CAD, History of Smoking, Lipids and RBBB  Symptoms:  Chest Pain, Chest Tightness and SOB   Nuclear Pre-Procedure Caffeine/Decaff Intake:  None> 12 hrs NPO After: 11:00pm   Lungs:  clear O2 Sat: 96% on room air. IV 0.9% NS with Angio Cath:  22g  IV Site: L Antecubital x 1, tolerated well IV Started by:  Irven Baltimore, RN  Chest Size (in):  38 Cup Size: DD  Height: 5\' 3"  (1.6 m)  Weight:  173 lb (78.472 kg)  BMI:  Body mass index is 30.65 kg/(m^2). Tech Comments:  N/A    Nuclear Med Study 1 or 2 day study: 1 day  Stress Test Type:  Treadmill/Lexiscan  Reading MD: N/A  Order Authorizing Provider:  Minus Breeding, MD  Resting Radionuclide: Technetium 48m Sestamibi  Resting Radionuclide Dose: 11.0 mCi   Stress Radionuclide:  Technetium 47m Sestamibi  Stress Radionuclide Dose: 33.0 mCi           Stress Protocol Rest HR: 65 Stress HR: 96  Rest BP: 147/77 Stress BP: 150/77  Exercise Time (min): n/a METS: n/a   Predicted Max HR: 151 bpm % Max HR: 63.58 bpm Rate Pressure Product: 14400   Dose of Adenosine (mg):  n/a Dose of Lexiscan: 0.4 mg  Dose of Atropine (mg): n/a Dose of Dobutamine: n/a mcg/kg/min (at max HR)  Stress Test Technologist: Perrin Maltese, EMT-P  Nuclear Technologist:  Charlton Amor, CNMT     Rest Procedure:  Myocardial perfusion imaging was performed at rest 45 minutes following the intravenous administration of Technetium 79m Sestamibi. Rest ECG: NSR - Normal EKG  Stress Procedure:  The patient received IV Lexiscan 0.4 mg over  15-seconds with concurrent low level exercise and then Technetium 76m Sestamibi was injected at 30-seconds while the patient continued walking one more minute. This patient had sob, lt. Headed and a headache with the Lexiscan injection.Quantitative spect images were obtained after a 45-minute delay. Stress ECG: No significant change from baseline ECG  QPS Raw Data Images:  Normal; no motion artifact; normal heart/lung ratio. Stress Images:  Small, mild inferolateral perfusion defect and small, mild apical septal perfusion defect.  Rest Images:  Small, mild inferolateral perfusion defect and small, mild apical septal perfusion defect. Subtraction (SDS):  Fixed small mild mid inferolateral defect and apical septal defect. Transient Ischemic Dilatation (Normal <1.22):  0.99 Lung/Heart Ratio (Normal <0.45):  0.36  Quantitative Gated Spect Images QGS EDV:  85 ml QGS ESV:  23 ml  Impression Exercise Capacity:  Lexiscan with no exercise. BP Response:  Normal blood pressure response. Clinical Symptoms:  Short of breath, headache ECG Impression:  No significant ST segment change suggestive of ischemia. Comparison with Prior Nuclear Study: Prior study was normal.  Overall Impression:  Low risk stress nuclear study with fixed small, mild apical septal and mid inferolateral perfusion defects.  There was prominent breast shadow.  There was normal wall motion.  SDS 0.  Suspect attenuation.  No ischemia noted. .  LV Ejection Fraction: 73%.  LV Wall  Motion:  NL LV Function; NL Wall Motion  [name] 07/20/2013

## 2013-07-24 NOTE — Telephone Encounter (Signed)
error 

## 2013-08-09 ENCOUNTER — Other Ambulatory Visit: Payer: Self-pay | Admitting: Critical Care Medicine

## 2013-08-18 DIAGNOSIS — F339 Major depressive disorder, recurrent, unspecified: Secondary | ICD-10-CM | POA: Diagnosis not present

## 2013-08-18 DIAGNOSIS — F429 Obsessive-compulsive disorder, unspecified: Secondary | ICD-10-CM | POA: Diagnosis not present

## 2013-08-18 DIAGNOSIS — F909 Attention-deficit hyperactivity disorder, unspecified type: Secondary | ICD-10-CM | POA: Diagnosis not present

## 2013-08-19 DIAGNOSIS — R7301 Impaired fasting glucose: Secondary | ICD-10-CM | POA: Diagnosis not present

## 2013-08-19 DIAGNOSIS — L659 Nonscarring hair loss, unspecified: Secondary | ICD-10-CM | POA: Diagnosis not present

## 2013-08-19 DIAGNOSIS — E559 Vitamin D deficiency, unspecified: Secondary | ICD-10-CM | POA: Diagnosis not present

## 2013-08-19 DIAGNOSIS — E7800 Pure hypercholesterolemia, unspecified: Secondary | ICD-10-CM | POA: Diagnosis not present

## 2013-08-19 DIAGNOSIS — R5381 Other malaise: Secondary | ICD-10-CM | POA: Diagnosis not present

## 2013-08-19 DIAGNOSIS — E039 Hypothyroidism, unspecified: Secondary | ICD-10-CM | POA: Diagnosis not present

## 2013-08-19 LAB — LIPID PANEL
Cholesterol: 221 mg/dL — AB (ref 0–200)
HDL: 64 mg/dL (ref 35–70)
LDL Cholesterol: 133 mg/dL
TRIGLYCERIDES: 124 mg/dL (ref 40–160)

## 2013-08-19 LAB — HEMOGLOBIN A1C: Hgb A1c MFr Bld: 6.2 % — AB (ref 4.0–6.0)

## 2013-08-21 ENCOUNTER — Other Ambulatory Visit: Payer: Self-pay

## 2013-08-21 DIAGNOSIS — Z1231 Encounter for screening mammogram for malignant neoplasm of breast: Secondary | ICD-10-CM

## 2013-08-25 ENCOUNTER — Ambulatory Visit (INDEPENDENT_AMBULATORY_CARE_PROVIDER_SITE_OTHER): Payer: Medicare Other | Admitting: Cardiology

## 2013-08-25 ENCOUNTER — Encounter: Payer: Self-pay | Admitting: Cardiology

## 2013-08-25 ENCOUNTER — Ambulatory Visit
Admission: RE | Admit: 2013-08-25 | Discharge: 2013-08-25 | Disposition: A | Discharge status: Home / Self Care | Payer: 59 | Source: Ambulatory Visit

## 2013-08-25 VITALS — BP 146/88 | HR 71 | Ht 62.0 in | Wt 176.0 lb

## 2013-08-25 DIAGNOSIS — Z1231 Encounter for screening mammogram for malignant neoplasm of breast: Secondary | ICD-10-CM | POA: Diagnosis not present

## 2013-08-25 DIAGNOSIS — R0600 Dyspnea, unspecified: Secondary | ICD-10-CM

## 2013-08-25 DIAGNOSIS — R0989 Other specified symptoms and signs involving the circulatory and respiratory systems: Secondary | ICD-10-CM

## 2013-08-25 DIAGNOSIS — R0609 Other forms of dyspnea: Secondary | ICD-10-CM

## 2013-08-25 NOTE — Patient Instructions (Signed)
Your physician recommends that you schedule a follow-up appointment in: as needed  

## 2013-08-25 NOTE — Progress Notes (Signed)
HPI The patient presents for evaluation of chest pain and dyspnea. We did an extensive evaluation and she returned to check review the results.  Echocardiogram was unremarkable. There is no evidence of ischemia on stress perfusion study. BNP was 21. She's also had a negative d-dimer. She is now being evaluated by Dr. Joya Gaskins  toconsider worsening of asthma. She did have a cardiopulmonary stress test. He is much relieved to hear the results as reported.  She has been under significant stress and wonders if this could be related. She continues to have dyspnea but she's not having any new chest pain. Her symptoms have been described previously.  Allergies  Allergen Reactions  . Astelin [Azelastine Hcl] Other (See Comments)    Headache  . Morphine     REACTION: Nausea  . Moxifloxacin     REACTION: confusion, dizziness, paranoia    Current Outpatient Prescriptions  Medication Sig Dispense Refill  . albuterol (PROVENTIL HFA) 108 (90 BASE) MCG/ACT inhaler Inhale 2 puffs into the lungs every 6 (six) hours as needed.  6.7 g  4  . Biotin (BIOTIN 5000) 5 MG CAPS Take 1 capsule by mouth daily.        . budesonide (PULMICORT) 180 MCG/ACT inhaler Inhale 2 puffs into the lungs 2 (two) times daily.      Marland Kitchen buPROPion (WELLBUTRIN XL) 150 MG 24 hr tablet Take 150 mg by mouth daily.        . Calcium Carbonate-Vit D-Min 600-400 MG-UNIT TABS 1 tablet by mouth 2 times daily      . Cholecalciferol (VITAMIN D3) 2000 UNITS TABS Take 1 tablet by mouth daily.        . clindamycin (CLEOCIN T) 1 % lotion Apply topically 2 (two) times daily as needed.      . clonazePAM (KLONOPIN) 0.5 MG tablet 1/4 by mouth daily as needed      . fexofenadine (ALLEGRA) 180 MG tablet Take 180 mg by mouth at bedtime.      . fluticasone (FLONASE) 50 MCG/ACT nasal spray USE 2 SPRAYS IN EACH NOSTRIL DAILY PRN      . fluticasone (FLONASE) 50 MCG/ACT nasal spray USE 2 SPRAYS IN EACH NOSTRIL DAILY  16 g  6  . folic acid (FOLVITE) 1 MG tablet  Take 1 mg by mouth 2 (two) times daily.        . Ginkgo Biloba 40 MG TABS Take 1 tablet by mouth 3 (three) times daily. As needed      . glucosamine-chondroitin 500-400 MG tablet Take 1 tablet by mouth daily.      Marland Kitchen levothyroxine (SYNTHROID, LEVOTHROID) 75 MCG tablet Take 75 mcg by mouth daily. Except Sunday        . levothyroxine (SYNTHROID, LEVOTHROID) 88 MCG tablet Take only sunday       . LYSINE PO Take 1 capsule by mouth. Once daily      . meloxicam (MOBIC) 15 MG tablet Take 1 tablet (15 mg total) by mouth every morning.  90 tablet  0  . METROGEL 1 % gel APPLY EVERY DAY  60 g  1  . omeprazole-sodium bicarbonate (ZEGERID) 40-1100 MG per capsule Take 1 capsule by mouth daily before breakfast.        . PARoxetine (PAXIL-CR) 12.5 MG 24 hr tablet Take 12.5 mg by mouth 2 (two) times daily.       . sodium chloride (BRONCHO SALINE) inhaler solution as needed. Saline wash twice daily      .  vitamin C (ASCORBIC ACID) 500 MG tablet Take 500 mg by mouth daily.       No current facility-administered medications for this visit.    Past Medical History  Diagnosis Date  . Other and unspecified hyperlipidemia   . Unspecified arthropathy, hand   . Peptic ulcer, unspecified site, unspecified as acute or chronic, without mention of hemorrhage, perforation, or obstruction   . Allergic rhinitis, cause unspecified   . Unspecified asthma(493.90)   . Benign paroxysmal positional vertigo   . Depressive disorder, not elsewhere classified   . Esophageal reflux   . Unspecified hypothyroidism   . Localized osteoarthrosis not specified whether primary or secondary, lower leg   . Other diseases of vocal cords     Past Surgical History  Procedure Laterality Date  . Nasal sinus surgery    . Knee arthroscopy    . Rotator cuff repair      ROS:  As stated in the HPI and negative for all other systems.  PHYSICAL EXAM BP 146/88  Pulse 71  Ht 5\' 2"  (1.575 m)  Wt 176 lb (79.833 kg)  BMI 32.18  kg/m2 GENERAL:  Well appearing NECK:  No jugular venous distention, waveform within normal limits, carotid upstroke brisk and symmetric, no bruits, no thyromegaly LUNGS:  Clear to auscultation bilaterally CHEST:  Unremarkable HEART:  PMI not displaced or sustained,S1 and S2 within normal limits, no S3, no S4, no clicks, no rubs, no murmurs ABD:  Flat, positive bowel sounds normal in frequency in pitch, no bruits, no rebound, no guarding, no midline pulsatile mass, no hepatomegaly, no splenomegaly EXT:  2 plus pulses throughout, no edema, no cyanosis no clubbing   ASSESSMENT AND PLAN   DYSPNEA:   Given the extensive negative workup. I do not suspect a cardiac etiology. No further cardiovascular testing is suggested. She will continue to follow with Dr. Joya Gaskins  CHEST PAIN:   Her chest pain is unusual and she a negative stress test. Given this no further cardiovascular testing is suggested. She will follow with METHENEY,CATHERINE, MD

## 2013-09-07 DIAGNOSIS — H179 Unspecified corneal scar and opacity: Secondary | ICD-10-CM | POA: Diagnosis not present

## 2013-09-07 DIAGNOSIS — H524 Presbyopia: Secondary | ICD-10-CM | POA: Diagnosis not present

## 2013-09-07 DIAGNOSIS — H5210 Myopia, unspecified eye: Secondary | ICD-10-CM | POA: Diagnosis not present

## 2013-09-07 DIAGNOSIS — H43819 Vitreous degeneration, unspecified eye: Secondary | ICD-10-CM | POA: Diagnosis not present

## 2013-09-15 DIAGNOSIS — F339 Major depressive disorder, recurrent, unspecified: Secondary | ICD-10-CM | POA: Diagnosis not present

## 2013-09-15 DIAGNOSIS — F429 Obsessive-compulsive disorder, unspecified: Secondary | ICD-10-CM | POA: Diagnosis not present

## 2013-09-15 DIAGNOSIS — F909 Attention-deficit hyperactivity disorder, unspecified type: Secondary | ICD-10-CM | POA: Diagnosis not present

## 2013-09-21 DIAGNOSIS — M19049 Primary osteoarthritis, unspecified hand: Secondary | ICD-10-CM | POA: Diagnosis not present

## 2013-09-21 DIAGNOSIS — M069 Rheumatoid arthritis, unspecified: Secondary | ICD-10-CM | POA: Diagnosis not present

## 2013-09-21 DIAGNOSIS — M169 Osteoarthritis of hip, unspecified: Secondary | ICD-10-CM | POA: Diagnosis not present

## 2013-09-21 DIAGNOSIS — M1610 Unilateral primary osteoarthritis, unspecified hip: Secondary | ICD-10-CM | POA: Diagnosis not present

## 2013-09-21 DIAGNOSIS — M25559 Pain in unspecified hip: Secondary | ICD-10-CM | POA: Diagnosis not present

## 2013-10-13 DIAGNOSIS — F909 Attention-deficit hyperactivity disorder, unspecified type: Secondary | ICD-10-CM | POA: Diagnosis not present

## 2013-10-13 DIAGNOSIS — F339 Major depressive disorder, recurrent, unspecified: Secondary | ICD-10-CM | POA: Diagnosis not present

## 2013-10-13 DIAGNOSIS — F429 Obsessive-compulsive disorder, unspecified: Secondary | ICD-10-CM | POA: Diagnosis not present

## 2013-10-23 ENCOUNTER — Other Ambulatory Visit: Payer: Self-pay | Admitting: Family Medicine

## 2013-11-02 ENCOUNTER — Ambulatory Visit (INDEPENDENT_AMBULATORY_CARE_PROVIDER_SITE_OTHER): Payer: Medicare Other | Admitting: Critical Care Medicine

## 2013-11-02 ENCOUNTER — Encounter: Payer: Self-pay | Admitting: Critical Care Medicine

## 2013-11-02 VITALS — BP 128/94 | HR 83 | Temp 97.1°F | Ht 63.0 in | Wt 178.4 lb

## 2013-11-02 DIAGNOSIS — J45909 Unspecified asthma, uncomplicated: Secondary | ICD-10-CM | POA: Diagnosis not present

## 2013-11-02 DIAGNOSIS — M25559 Pain in unspecified hip: Secondary | ICD-10-CM | POA: Diagnosis not present

## 2013-11-02 DIAGNOSIS — J4540 Moderate persistent asthma, uncomplicated: Secondary | ICD-10-CM

## 2013-11-02 DIAGNOSIS — M169 Osteoarthritis of hip, unspecified: Secondary | ICD-10-CM | POA: Diagnosis not present

## 2013-11-02 DIAGNOSIS — M1610 Unilateral primary osteoarthritis, unspecified hip: Secondary | ICD-10-CM | POA: Diagnosis not present

## 2013-11-02 NOTE — Assessment & Plan Note (Signed)
Moderate persistent asthma associated allergic rhinitis Stable Plan Return 4 months Cont daily ICS and nasal steroid and allergra

## 2013-11-02 NOTE — Progress Notes (Signed)
Subjective:    Patient ID: [name], female    DOB: [birth year], 68 y.o.   MRN: [id number]  HPI 11/02/2013 Chief Complaint  Patient presents with  . Follow-up    Dry cough started over the summer - worse over the past 2 wks.  Occas DOE and wheezing.  Cough started over the summer.  Seems worse past two weeks.  occ wheeze. pfr 350-400 .   Pt denies any significant sore throat, nasal congestion or excess secretions, fever, chills, sweats, unintended weight loss, pleurtic or exertional chest pain, orthopnea PND, or leg swelling Pt denies any increase in rescue therapy over baseline, denies waking up needing it or having any early am or nocturnal exacerbations of coughing/wheezing/or dyspnea. Pt also denies any obvious fluctuation in symptoms with  weather or environmental change or other alleviating or aggravating factors       PUL ASTHMA HISTORY 11/02/2013 07/11/2013 07/01/2012 11/07/2011  Symptoms 0-2 days/week Daily 0-2 days/week 0-2 days/week  Nighttime awakenings 0-2/month 0-2/month 0-2/month 0-2/month  Interference with activity No limitations Some limitations No limitations No limitations  SABA use 0-2 days/wk > 2 days/wk--not > 1 x/day 0-2 days/wk 0-2 days/wk  Exacerbations requiring oral steroids 0-1 / year 0-1 / year 0-1 / year 0-1 / year      Review of Systems Constitutional:   No  weight loss, night sweats,  Fevers, chills, fatigue, lassitude. HEENT:   No headaches,  Difficulty swallowing,  Tooth/dental problems,  Sore throat,                No sneezing, itching, ear ache, nasal congestion, post nasal drip,   CV:  No chest pain,  Orthopnea, PND, swelling in lower extremities, anasarca, dizziness, palpitations  GI  No heartburn, indigestion, abdominal pain, nausea, vomiting, diarrhea, change in bowel habits, loss of appetite  Resp: No shortness of breath with exertion or at rest.  No excess mucus, no productive cough,  Notes non-productive cough,  No coughing up of  blood.  No change in color of mucus.  No wheezing.  No chest wall deformity  Skin: no rash or lesions.  GU: no dysuria, change in color of urine, no urgency or frequency.  No flank pain.  MS:  No joint pain or swelling.  No decreased range of motion.  No back pain.  Psych:  No change in mood or affect. No depression or anxiety.  No memory loss.     Objective:   Physical Exam Filed Vitals:   11/02/13 1426  BP: 128/94  Pulse: 83  Temp: 97.1 F (36.2 C)  TempSrc: Oral  Height: 5\' 3"  (1.6 m)  Weight: 178 lb 6.4 oz (80.922 kg)  SpO2: 97%    Gen: Pleasant, well-nourished, in no distress,  normal affect  ENT: No lesions,  mouth clear,  oropharynx clear, no postnasal drip  Neck: No JVD, no TMG, no carotid bruits  Lungs: No use of accessory muscles, no dullness to percussion, clear without rales or rhonchi  Cardiovascular: RRR, heart sounds normal, no murmur or gallops, no peripheral edema  Abdomen: soft and NT, no HSM,  BS normal  Musculoskeletal: No deformities, no cyanosis or clubbing  Neuro: alert, non focal  Skin: Warm, no lesions or rashes  No results found.       Assessment & Plan:   Asthma with allergic rhinitis Moderate persistent asthma associated allergic rhinitis Stable Plan Return 4 months Cont daily ICS and nasal steroid and allergra   Updated Medication List  Outpatient Encounter Prescriptions as of 11/02/2013  Medication Sig  . albuterol (PROVENTIL HFA) 108 (90 BASE) MCG/ACT inhaler Inhale 2 puffs into the lungs every 6 (six) hours as needed.  . Biotin (BIOTIN 5000) 5 MG CAPS Take 1 capsule by mouth daily.    . budesonide (PULMICORT) 180 MCG/ACT inhaler Inhale 2 puffs into the lungs daily. Using 2nd time if needed  . buPROPion (WELLBUTRIN XL) 150 MG 24 hr tablet Take 150 mg by mouth daily.    . Calcium Carbonate-Vit D-Min 600-400 MG-UNIT TABS 1 tablet by mouth 2 times daily  . Cholecalciferol (VITAMIN D3) 2000 UNITS TABS Take 1 tablet by mouth  daily.    . clindamycin (CLEOCIN T) 1 % lotion Apply topically 2 (two) times daily as needed.  . clonazePAM (KLONOPIN) 0.5 MG tablet 1/4 by mouth daily as needed  . fexofenadine (ALLEGRA) 180 MG tablet Take 180 mg by mouth at bedtime.  . fluticasone (FLONASE) 50 MCG/ACT nasal spray USE 2 SPRAYS IN EACH NOSTRIL DAILY  . folic acid (FOLVITE) 1 MG tablet Take 1 mg by mouth 2 (two) times daily.    . Ginkgo Biloba 40 MG TABS Take 1 tablet by mouth 3 (three) times daily. As needed  . glucosamine-chondroitin 500-400 MG tablet Take 1 tablet by mouth daily.  . Guaifenesin (MUCINEX MAXIMUM STRENGTH) 1200 MG TB12 Take 1 tablet by mouth 2 (two) times daily as needed.  Marland Kitchen levothyroxine (SYNTHROID, LEVOTHROID) 75 MCG tablet Take 75 mcg by mouth daily. Except Sunday    . levothyroxine (SYNTHROID, LEVOTHROID) 88 MCG tablet Take only sunday   . LYSINE PO Take 1 capsule by mouth. Once daily  . meloxicam (MOBIC) 15 MG tablet TAKE 1 TABLET BY MOUTH EVERY MORNING  . METROGEL 1 % gel APPLY EVERY DAY  . omeprazole-sodium bicarbonate (ZEGERID) 40-1100 MG per capsule Take 1 capsule by mouth daily before breakfast.    . PARoxetine (PAXIL-CR) 12.5 MG 24 hr tablet Take 12.5 mg by mouth 2 (two) times daily.   . sodium chloride (BRONCHO SALINE) inhaler solution as needed. Saline wash twice daily  . vitamin C (ASCORBIC ACID) 500 MG tablet Take 500 mg by mouth daily.  . [DISCONTINUED] fluticasone (FLONASE) 50 MCG/ACT nasal spray USE 2 SPRAYS IN EACH NOSTRIL DAILY PRN  . [DISCONTINUED] budesonide (PULMICORT) 180 MCG/ACT inhaler Inhale 2 puffs into the lungs 2 (two) times daily.

## 2013-11-02 NOTE — Patient Instructions (Signed)
No change in medications Return 4 months 

## 2013-11-26 DIAGNOSIS — F410 Panic disorder [episodic paroxysmal anxiety] without agoraphobia: Secondary | ICD-10-CM | POA: Diagnosis not present

## 2013-11-26 DIAGNOSIS — F42 Obsessive-compulsive disorder: Secondary | ICD-10-CM | POA: Diagnosis not present

## 2013-11-26 DIAGNOSIS — E559 Vitamin D deficiency, unspecified: Secondary | ICD-10-CM | POA: Diagnosis not present

## 2013-11-26 DIAGNOSIS — R7301 Impaired fasting glucose: Secondary | ICD-10-CM | POA: Diagnosis not present

## 2013-11-26 DIAGNOSIS — F901 Attention-deficit hyperactivity disorder, predominantly hyperactive type: Secondary | ICD-10-CM | POA: Diagnosis not present

## 2013-11-26 DIAGNOSIS — E039 Hypothyroidism, unspecified: Secondary | ICD-10-CM | POA: Diagnosis not present

## 2013-11-26 DIAGNOSIS — E780 Pure hypercholesterolemia: Secondary | ICD-10-CM | POA: Diagnosis not present

## 2013-12-08 ENCOUNTER — Other Ambulatory Visit: Payer: Self-pay | Admitting: Family Medicine

## 2013-12-14 DIAGNOSIS — E038 Other specified hypothyroidism: Secondary | ICD-10-CM | POA: Diagnosis not present

## 2013-12-14 DIAGNOSIS — E559 Vitamin D deficiency, unspecified: Secondary | ICD-10-CM | POA: Diagnosis not present

## 2013-12-14 DIAGNOSIS — E780 Pure hypercholesterolemia: Secondary | ICD-10-CM | POA: Diagnosis not present

## 2013-12-14 DIAGNOSIS — L659 Nonscarring hair loss, unspecified: Secondary | ICD-10-CM | POA: Diagnosis not present

## 2013-12-14 DIAGNOSIS — R7301 Impaired fasting glucose: Secondary | ICD-10-CM | POA: Diagnosis not present

## 2013-12-14 DIAGNOSIS — Z23 Encounter for immunization: Secondary | ICD-10-CM | POA: Diagnosis not present

## 2013-12-14 DIAGNOSIS — R5382 Chronic fatigue, unspecified: Secondary | ICD-10-CM | POA: Diagnosis not present

## 2013-12-29 DIAGNOSIS — F410 Panic disorder [episodic paroxysmal anxiety] without agoraphobia: Secondary | ICD-10-CM | POA: Diagnosis not present

## 2013-12-29 DIAGNOSIS — F42 Obsessive-compulsive disorder: Secondary | ICD-10-CM | POA: Diagnosis not present

## 2013-12-29 DIAGNOSIS — F902 Attention-deficit hyperactivity disorder, combined type: Secondary | ICD-10-CM | POA: Diagnosis not present

## 2014-01-08 ENCOUNTER — Other Ambulatory Visit: Payer: Self-pay | Admitting: Family Medicine

## 2014-01-12 ENCOUNTER — Telehealth: Payer: Self-pay | Admitting: Hematology

## 2014-01-12 ENCOUNTER — Telehealth: Payer: Self-pay

## 2014-01-12 NOTE — Telephone Encounter (Signed)
, °

## 2014-01-12 NOTE — Telephone Encounter (Signed)
Pt questions whether follow up appts with cancer center needed.  Per Dr. Lindi Adie, as along as she is having annual gyn exams that include breast exam and an annual mammogram, she is fine to not have follows up here.  Let pt know she can call us if she needs anything.  Pt voiced understanding.  Future Appts cancelled.

## 2014-01-18 DIAGNOSIS — M0609 Rheumatoid arthritis without rheumatoid factor, multiple sites: Secondary | ICD-10-CM | POA: Diagnosis not present

## 2014-01-21 ENCOUNTER — Ambulatory Visit: Payer: Medicare Other | Admitting: Family Medicine

## 2014-01-25 ENCOUNTER — Encounter: Payer: Self-pay | Admitting: Family Medicine

## 2014-01-25 ENCOUNTER — Ambulatory Visit (INDEPENDENT_AMBULATORY_CARE_PROVIDER_SITE_OTHER): Payer: Medicare Other | Admitting: Family Medicine

## 2014-01-25 VITALS — BP 155/91 | HR 86 | Temp 97.8°F | Ht 63.0 in | Wt 180.0 lb

## 2014-01-25 DIAGNOSIS — R7301 Impaired fasting glucose: Secondary | ICD-10-CM

## 2014-01-25 DIAGNOSIS — J019 Acute sinusitis, unspecified: Secondary | ICD-10-CM

## 2014-01-25 MED ORDER — AMOXICILLIN-POT CLAVULANATE 875-125 MG PO TABS: 1.0000 | ORAL_TABLET | Freq: Two times a day (BID) | ORAL | Status: DC

## 2014-01-25 NOTE — Progress Notes (Signed)
   Subjective:    Patient ID: [name], female    DOB: [birth year], 68 y.o.   MRN: [id number]  HPI Says has been since for 2 weeks since Thanksgiving. Thought she was getting a little better but started feeling worse about 5  days ago. C/O sinus congestion  Thick creamy mucous. Sometimes sees a clump of yellow green mucous. Fever to 100.5 over the weekend but no fever today.  Using mucinex BID. No relief.  Says she is tired and stressed. She feels like she is constantly going and thinks may have worn herself out.   She also had some questions about how many carbs she should be eating per meal. She was recently told that her A1c was 6.4 and that she is in the prediabetic/diabetic range. She's been eating about 100 carbs per day. She's been trying to cut back and lose weight as well. She has not started exercising but plans to. She feels like she is active around the home taking care of her grandchild but does not actually have an exercise regimen or work out regularly.  Review of Systems     Objective:   Physical Exam  Constitutional: She is oriented to person, place, and time. She appears well-developed and well-nourished.  HENT:  Head: Normocephalic and atraumatic.  Right Ear: External ear normal.  Left Ear: External ear normal.  Nose: Nose normal.  Mouth/Throat: Oropharynx is clear and moist.  TMs and canals are clear.   Eyes: Conjunctivae and EOM are normal. Pupils are equal, round, and reactive to light.  Neck: Neck supple. No thyromegaly present.  Cardiovascular: Normal rate, regular rhythm and normal heart sounds.   Pulmonary/Chest: Effort normal and breath sounds normal. She has no wheezes.  Lymphadenopathy:    She has no cervical adenopathy.  Neurological: She is alert and oriented to person, place, and time.  Skin: Skin is warm and dry.  Psychiatric: She has a normal mood and affect.          Assessment & Plan:  Acute sinusitis - will treat with Augmentin. Call if  not better in one week. Continue symptomatic treatment. Troponin plenty of fluids. She is feeling a little dizzy and lightheaded today. She's to call me back if this persists after we cleared the sinus infection.  Impaired fasting glucose-A1c was 6.4. Gave her handout with recommended number of calories per day. This is more for maintenance and not for weight loss she would need to reduce them further. I did recommend between 35-45 g of carbs per meal.

## 2014-01-25 NOTE — Patient Instructions (Addendum)
Sinusitis Sinusitis is redness, soreness, and inflammation of the paranasal sinuses. Paranasal sinuses are air pockets within the bones of your face (beneath the eyes, the middle of the forehead, or above the eyes). In healthy paranasal sinuses, mucus is able to drain out, and air is able to circulate through them by way of your nose. However, when your paranasal sinuses are inflamed, mucus and air can become trapped. This can allow bacteria and other germs to grow and cause infection. Sinusitis can develop quickly and last only a short time (acute) or continue over a long period (chronic). Sinusitis that lasts for more than 12 weeks is considered chronic.  CAUSES  Causes of sinusitis include:  Allergies.  Structural abnormalities, such as displacement of the cartilage that separates your nostrils (deviated septum), which can decrease the air flow through your nose and sinuses and affect sinus drainage.  Functional abnormalities, such as when the small hairs (cilia) that line your sinuses and help remove mucus do not work properly or are not present. SIGNS AND SYMPTOMS  Symptoms of acute and chronic sinusitis are the same. The primary symptoms are pain and pressure around the affected sinuses. Other symptoms include:  Upper toothache.  Earache.  Headache.  Bad breath.  Decreased sense of smell and taste.  A cough, which worsens when you are lying flat.  Fatigue.  Fever.  Thick drainage from your nose, which often is green and may contain pus (purulent).  Swelling and warmth over the affected sinuses. DIAGNOSIS  Your health care provider will perform a physical exam. During the exam, your health care provider may:  Look in your nose for signs of abnormal growths in your nostrils (nasal polyps).  Tap over the affected sinus to check for signs of infection.  View the inside of your sinuses (endoscopy) using an imaging device that has a light attached (endoscope). If your health  care provider suspects that you have chronic sinusitis, one or more of the following tests may be recommended:  Allergy tests.  Nasal culture. A sample of mucus is taken from your nose, sent to a lab, and screened for bacteria.  Nasal cytology. A sample of mucus is taken from your nose and examined by your health care provider to determine if your sinusitis is related to an allergy. TREATMENT  Most cases of acute sinusitis are related to a viral infection and will resolve on their own within 10 days. Sometimes medicines are prescribed to help relieve symptoms (pain medicine, decongestants, nasal steroid sprays, or saline sprays).  However, for sinusitis related to a bacterial infection, your health care provider will prescribe antibiotic medicines. These are medicines that will help kill the bacteria causing the infection.  Rarely, sinusitis is caused by a fungal infection. In theses cases, your health care provider will prescribe antifungal medicine. For some cases of chronic sinusitis, surgery is needed. Generally, these are cases in which sinusitis recurs more than 3 times per year, despite other treatments. HOME CARE INSTRUCTIONS   Drink plenty of water. Water helps thin the mucus so your sinuses can drain more easily.  Use a humidifier.  Inhale steam 3 to 4 times a day (for example, sit in the bathroom with the shower running).  Apply a warm, moist washcloth to your face 3 to 4 times a day, or as directed by your health care provider.  Use saline nasal sprays to help moisten and clean your sinuses.  Take medicines only as directed by your health care provider.    If you were prescribed either an antibiotic or antifungal medicine, finish it all even if you start to feel better. SEEK IMMEDIATE MEDICAL CARE IF:  You have increasing pain or severe headaches.  You have nausea, vomiting, or drowsiness.  You have swelling around your face.  You have vision problems.  You have a stiff  neck.  You have difficulty breathing. MAKE SURE YOU:   Understand these instructions.  Will watch your condition.  Will get help right away if you are not doing well or get worse. Document Released: 01/29/2005 Document Revised: 06/15/2013 Document Reviewed: 02/13/2011 [facility] Patient Information 2015 [place], [place]. This information is not intended to replace advice given to you by your health care provider. Make sure you discuss any questions you have with your health care provider. Basic Carbohydrate Counting for Diabetes Mellitus Carbohydrate counting is a method for keeping track of the amount of carbohydrates you eat. Eating carbohydrates naturally increases the level of sugar (glucose) in your blood, so it is important for you to know the amount that is okay for you to have in every meal. Carbohydrate counting helps keep the level of glucose in your blood within normal limits. The amount of carbohydrates allowed is different for every person. A dietitian can help you calculate the amount that is right for you. Once you know the amount of carbohydrates you can have, you can count the carbohydrates in the foods you want to eat. Carbohydrates are found in the following foods:  Grains, such as breads and cereals.  Dried beans and soy products.  Starchy vegetables, such as potatoes, peas, and corn.  Fruit and fruit juices.  Milk and yogurt.  Sweets and snack foods, such as cake, cookies, candy, chips, soft drinks, and fruit drinks. CARBOHYDRATE COUNTING There are two ways to count the carbohydrates in your food. You can use either of the methods or a combination of both. Reading the "Nutrition Facts" on [place] The "Nutrition Facts" is an area that is included on the labels of almost all packaged food and beverages in the [place]. It includes the serving size of that food or beverage and information about the nutrients in each serving of the food, including the grams (g)  of carbohydrate per serving.  Decide the number of servings of this food or beverage that you will be able to eat or drink. Multiply that number of servings by the number of grams of carbohydrate that is listed on the label for that serving. The total will be the amount of carbohydrates you will be having when you eat or drink this food or beverage. Learning Standard Serving Sizes of Food When you eat food that is not packaged or does not include "Nutrition Facts" on the label, you need to measure the servings in order to count the amount of carbohydrates.A serving of most carbohydrate-rich foods contains about 15 g of carbohydrates. The following list includes serving sizes of carbohydrate-rich foods that provide 15 g ofcarbohydrate per serving:   1 slice of bread (1 oz) or 1 six-inch tortilla.    of a hamburger bun or English muffin.  4-6 crackers.   cup unsweetened dry cereal.    cup hot cereal.   cup rice or pasta.    cup mashed potatoes or  of a large baked potato.  1 cup fresh fruit or one small piece of fruit.    cup canned or frozen fruit or fruit juice.  1 cup milk.   cup plain fat-free  yogurt or yogurt sweetened with artificial sweeteners.   cup cooked dried beans or starchy vegetable, such as peas, corn, or potatoes.  Decide the number of standard-size servings that you will eat. Multiply that number of servings by 15 (the grams of carbohydrates in that serving). For example, if you eat 2 cups of strawberries, you will have eaten 2 servings and 30 g of carbohydrates (2 servings x 15 g = 30 g). For foods such as soups and casseroles, in which more than one food is mixed in, you will need to count the carbohydrates in each food that is included. EXAMPLE OF CARBOHYDRATE COUNTING Sample Dinner  3 oz chicken breast.   cup of brown rice.   cup of corn.  1 cup milk.   1 cup strawberries with sugar-free whipped topping.  Carbohydrate Calculation Step  1: Identify the foods that contain carbohydrates:   Rice.   Corn.   Milk.   Strawberries. Step 2:Calculate the number of servings eaten of each:   2 servings of rice.   1 serving of corn.   1 serving of milk.   1 serving of strawberries. Step 3: Multiply each of those number of servings by 15 g:   2 servings of rice x 15 g = 30 g.   1 serving of corn x 15 g = 15 g.   1 serving of milk x 15 g = 15 g.   1 serving of strawberries x 15 g = 15 g. Step 4: Add together all of the amounts to find the total grams of carbohydrates eaten: 30 g + 15 g + 15 g + 15 g = 75 g. Document Released: 01/29/2005 Document Revised: 06/15/2013 Document Reviewed: 12/26/2012 [facility] Patient Information 2015 [place], [place]. This information is not intended to replace advice given to you by your health care provider. Make sure you discuss any questions you have with your health care provider.

## 2014-02-01 ENCOUNTER — Ambulatory Visit: Payer: Medicare Other | Admitting: Family Medicine

## 2014-02-18 DIAGNOSIS — M25551 Pain in right hip: Secondary | ICD-10-CM | POA: Diagnosis not present

## 2014-02-18 DIAGNOSIS — M0609 Rheumatoid arthritis without rheumatoid factor, multiple sites: Secondary | ICD-10-CM | POA: Diagnosis not present

## 2014-02-18 DIAGNOSIS — M1611 Unilateral primary osteoarthritis, right hip: Secondary | ICD-10-CM | POA: Diagnosis not present

## 2014-02-18 DIAGNOSIS — M19049 Primary osteoarthritis, unspecified hand: Secondary | ICD-10-CM | POA: Diagnosis not present

## 2014-02-23 DIAGNOSIS — F902 Attention-deficit hyperactivity disorder, combined type: Secondary | ICD-10-CM | POA: Diagnosis not present

## 2014-02-23 DIAGNOSIS — F410 Panic disorder [episodic paroxysmal anxiety] without agoraphobia: Secondary | ICD-10-CM | POA: Diagnosis not present

## 2014-02-23 DIAGNOSIS — F42 Obsessive-compulsive disorder: Secondary | ICD-10-CM | POA: Diagnosis not present

## 2014-03-03 ENCOUNTER — Ambulatory Visit: Payer: Medicare Other | Admitting: Critical Care Medicine

## 2014-03-15 DIAGNOSIS — R7301 Impaired fasting glucose: Secondary | ICD-10-CM | POA: Diagnosis not present

## 2014-03-15 DIAGNOSIS — E559 Vitamin D deficiency, unspecified: Secondary | ICD-10-CM | POA: Diagnosis not present

## 2014-03-15 DIAGNOSIS — E780 Pure hypercholesterolemia: Secondary | ICD-10-CM | POA: Diagnosis not present

## 2014-03-15 DIAGNOSIS — R5382 Chronic fatigue, unspecified: Secondary | ICD-10-CM | POA: Diagnosis not present

## 2014-03-15 DIAGNOSIS — E038 Other specified hypothyroidism: Secondary | ICD-10-CM | POA: Diagnosis not present

## 2014-03-16 DIAGNOSIS — F42 Obsessive-compulsive disorder: Secondary | ICD-10-CM | POA: Diagnosis not present

## 2014-03-16 DIAGNOSIS — F902 Attention-deficit hyperactivity disorder, combined type: Secondary | ICD-10-CM | POA: Diagnosis not present

## 2014-03-16 DIAGNOSIS — F410 Panic disorder [episodic paroxysmal anxiety] without agoraphobia: Secondary | ICD-10-CM | POA: Diagnosis not present

## 2014-03-17 ENCOUNTER — Ambulatory Visit: Payer: Medicare Other | Admitting: Critical Care Medicine

## 2014-03-22 DIAGNOSIS — R5382 Chronic fatigue, unspecified: Secondary | ICD-10-CM | POA: Diagnosis not present

## 2014-03-22 DIAGNOSIS — E780 Pure hypercholesterolemia: Secondary | ICD-10-CM | POA: Diagnosis not present

## 2014-03-22 DIAGNOSIS — L659 Nonscarring hair loss, unspecified: Secondary | ICD-10-CM | POA: Diagnosis not present

## 2014-03-22 DIAGNOSIS — R7301 Impaired fasting glucose: Secondary | ICD-10-CM | POA: Diagnosis not present

## 2014-03-22 DIAGNOSIS — E038 Other specified hypothyroidism: Secondary | ICD-10-CM | POA: Diagnosis not present

## 2014-03-22 DIAGNOSIS — E559 Vitamin D deficiency, unspecified: Secondary | ICD-10-CM | POA: Diagnosis not present

## 2014-03-22 LAB — T4, FREE: FREE T4 BY DIALYSIS: 1.24

## 2014-03-22 LAB — LIPID PANEL
Cholesterol: 210 mg/dL — AB (ref 0–200)
HDL: 65 mg/dL (ref 35–70)
LDL Cholesterol: 122 mg/dL
Triglycerides: 115 mg/dL (ref 40–160)

## 2014-03-22 LAB — TSH: TSH: 0.64 u[IU]/mL (ref 0.41–5.90)

## 2014-04-06 DIAGNOSIS — L820 Inflamed seborrheic keratosis: Secondary | ICD-10-CM | POA: Diagnosis not present

## 2014-04-06 DIAGNOSIS — D492 Neoplasm of unspecified behavior of bone, soft tissue, and skin: Secondary | ICD-10-CM | POA: Diagnosis not present

## 2014-04-06 DIAGNOSIS — L814 Other melanin hyperpigmentation: Secondary | ICD-10-CM | POA: Diagnosis not present

## 2014-04-06 DIAGNOSIS — D1801 Hemangioma of skin and subcutaneous tissue: Secondary | ICD-10-CM | POA: Diagnosis not present

## 2014-04-06 DIAGNOSIS — L579 Skin changes due to chronic exposure to nonionizing radiation, unspecified: Secondary | ICD-10-CM | POA: Diagnosis not present

## 2014-04-06 DIAGNOSIS — D225 Melanocytic nevi of trunk: Secondary | ICD-10-CM | POA: Diagnosis not present

## 2014-04-13 DIAGNOSIS — F410 Panic disorder [episodic paroxysmal anxiety] without agoraphobia: Secondary | ICD-10-CM | POA: Diagnosis not present

## 2014-04-13 DIAGNOSIS — F902 Attention-deficit hyperactivity disorder, combined type: Secondary | ICD-10-CM | POA: Diagnosis not present

## 2014-04-13 DIAGNOSIS — F42 Obsessive-compulsive disorder: Secondary | ICD-10-CM | POA: Diagnosis not present

## 2014-04-19 ENCOUNTER — Ambulatory Visit: Payer: Medicare Other | Admitting: Critical Care Medicine

## 2014-04-21 ENCOUNTER — Encounter: Payer: Self-pay | Admitting: Critical Care Medicine

## 2014-04-21 ENCOUNTER — Ambulatory Visit (INDEPENDENT_AMBULATORY_CARE_PROVIDER_SITE_OTHER): Payer: Medicare Other | Admitting: Critical Care Medicine

## 2014-04-21 VITALS — BP 134/84 | HR 73 | Temp 98.2°F | Ht 63.0 in | Wt 179.0 lb

## 2014-04-21 DIAGNOSIS — J4540 Moderate persistent asthma, uncomplicated: Secondary | ICD-10-CM

## 2014-04-21 NOTE — Patient Instructions (Signed)
No change in medications. Return in     6 months          I suggest [name] for hip surgery, let us know when this is scheduled

## 2014-04-21 NOTE — Progress Notes (Signed)
Subjective:    Patient ID: [name], female    DOB: 1945/12/12, 69 y.o.   MRN: [id number]  HPI 04/21/2014 Chief Complaint  Patient presents with  . Follow-up    Last seen 11/02/13. Pt states asthma has been much better since last visit. Denies wheezing, productive cough, chest tightness or pain, n/v, f/c/s, hemoptysis or edema   Pt needs R total hip replacement.  Pt has rheum MD Ouida Sills. Overall breathing is doing well.   Pt denies any significant sore throat, nasal congestion or excess secretions, fever, chills, sweats, unintended weight loss, pleurtic or exertional chest pain, orthopnea PND, or leg swelling Pt denies any increase in rescue therapy over baseline, denies waking up needing it or having any early am or nocturnal exacerbations of coughing/wheezing/or dyspnea. Pt also denies any obvious fluctuation in symptoms with  weather or environmental change or other alleviating or aggravating factors    PUL ASTHMA HISTORY 04/21/2014 11/02/2013 07/11/2013 07/01/2012 11/07/2011  Symptoms >2 days/week 0-2 days/week Daily 0-2 days/week 0-2 days/week  Nighttime awakenings 0-2/month 0-2/month 0-2/month 0-2/month 0-2/month  Interference with activity Minor limitations No limitations Some limitations No limitations No limitations  SABA use 0-2 days/wk 0-2 days/wk > 2 days/wk--not > 1 x/day 0-2 days/wk 0-2 days/wk  Exacerbations requiring oral steroids 0-1 / year 0-1 / year 0-1 / year 0-1 / year 0-1 / year      Review of Systems Constitutional:   No  weight loss, night sweats,  Fevers, chills, fatigue, lassitude. HEENT:   No headaches,  Difficulty swallowing,  Tooth/dental problems,  Sore throat,                No sneezing, itching, ear ache, nasal congestion, post nasal drip,   CV:  No chest pain,  Orthopnea, PND, swelling in lower extremities, anasarca, dizziness, palpitations  GI  No heartburn, indigestion, abdominal pain, nausea, vomiting, diarrhea, change in bowel habits, loss of  appetite  Resp: No shortness of breath with exertion or at rest.  No excess mucus, no productive cough,  Notes non-productive cough,  No coughing up of blood.  No change in color of mucus.  No wheezing.  No chest wall deformity  Skin: no rash or lesions.  GU: no dysuria, change in color of urine, no urgency or frequency.  No flank pain.  MS:  No joint pain or swelling.  No decreased range of motion.  No back pain.  Psych:  No change in mood or affect. No depression or anxiety.  No memory loss.     Objective:   Physical Exam Filed Vitals:   04/21/14 1054  BP: 134/84  Pulse: 73  Temp: 98.2 F (36.8 C)  TempSrc: Oral  Height: 5\' 3"  (1.6 m)  Weight: 179 lb (81.194 kg)  SpO2: 94%    Gen: Pleasant, well-nourished, in no distress,  normal affect  ENT: No lesions,  mouth clear,  oropharynx clear, no postnasal drip  Neck: No JVD, no TMG, no carotid bruits  Lungs: No use of accessory muscles, no dullness to percussion, clear without rales or rhonchi  Cardiovascular: RRR, heart sounds normal, no murmur or gallops, no peripheral edema  Abdomen: soft and NT, no HSM,  BS normal  Musculoskeletal: No deformities, no cyanosis or clubbing  Neuro: alert, non focal  Skin: Warm, no lesions or rashes  No results found.       Assessment & Plan:   Asthma with allergic rhinitis Mod persistent asthma with rhinitis atopic , stable on  ICS and nasal steroid Plan Cont inhaled steroids     Updated Medication List Outpatient Encounter Prescriptions as of 04/21/2014  Medication Sig  . albuterol (PROVENTIL HFA) 108 (90 BASE) MCG/ACT inhaler Inhale 2 puffs into the lungs every 6 (six) hours as needed.  . Biotin (BIOTIN 5000) 5 MG CAPS Take 1 capsule by mouth daily.    . budesonide (PULMICORT) 180 MCG/ACT inhaler Inhale 2 puffs into the lungs daily. Using 2nd time if needed  . buPROPion (WELLBUTRIN XL) 150 MG 24 hr tablet Take 150 mg by mouth daily.    . Calcium Carbonate-Vit D-Min  600-400 MG-UNIT TABS 1 tablet by mouth 2 times daily  . Cholecalciferol (VITAMIN D3) 2000 UNITS TABS Take 1 tablet by mouth daily.    . clindamycin (CLEOCIN T) 1 % lotion Apply topically 2 (two) times daily as needed.  . clonazePAM (KLONOPIN) 0.5 MG tablet 1/4 by mouth daily as needed  . fexofenadine (ALLEGRA) 180 MG tablet Take 180 mg by mouth at bedtime.  . fluticasone (FLONASE) 50 MCG/ACT nasal spray USE 2 SPRAYS IN EACH NOSTRIL DAILY (Patient taking differently: USE 2 SPRAYS IN EACH NOSTRIL once daily as needed)  . folic acid (FOLVITE) 1 MG tablet Take 1 mg by mouth 2 (two) times daily.    . Ginkgo Biloba 40 MG TABS Take 1 tablet by mouth 3 (three) times daily. As needed  . Guaifenesin (MUCINEX MAXIMUM STRENGTH) 1200 MG TB12 Take 1 tablet by mouth 2 (two) times daily as needed.  Marland Kitchen levothyroxine (SYNTHROID, LEVOTHROID) 75 MCG tablet Take 75 mcg by mouth daily. Except Sunday    . levothyroxine (SYNTHROID, LEVOTHROID) 88 MCG tablet Take only sunday   . LYSINE PO Take 1 capsule by mouth. Once daily  . meloxicam (MOBIC) 15 MG tablet TAKE 1 TABLET BY MOUTH EVERY MORNING **OFFICE APPOINTMENT NECESSARY FOR REFILLS**  . metroNIDAZOLE (METROGEL) 1 % gel APPLY EVERY DAY **OFFICE APPOINTMENT NECESSARY**  . omeprazole-sodium bicarbonate (ZEGERID) 40-1100 MG per capsule Take 1 capsule by mouth daily before breakfast.    . PARoxetine (PAXIL-CR) 12.5 MG 24 hr tablet Take 12.5 mg by mouth 2 (two) times daily.   . sodium chloride (BRONCHO SALINE) inhaler solution as needed. Saline wash twice daily  . vitamin C (ASCORBIC ACID) 500 MG tablet Take 500 mg by mouth every other day.   . [DISCONTINUED] amoxicillin-clavulanate (AUGMENTIN) 875-125 MG per tablet Take 1 tablet by mouth 2 (two) times daily.

## 2014-04-22 NOTE — Assessment & Plan Note (Signed)
Mod persistent asthma with rhinitis atopic , stable on ICS and nasal steroid Plan Cont inhaled steroids

## 2014-04-26 ENCOUNTER — Encounter: Payer: Self-pay | Admitting: Family Medicine

## 2014-04-26 ENCOUNTER — Ambulatory Visit (INDEPENDENT_AMBULATORY_CARE_PROVIDER_SITE_OTHER): Payer: Medicare Other | Admitting: Family Medicine

## 2014-04-26 VITALS — BP 151/88 | HR 89 | Temp 98.9°F | Wt 179.0 lb

## 2014-04-26 DIAGNOSIS — R7301 Impaired fasting glucose: Secondary | ICD-10-CM | POA: Diagnosis not present

## 2014-04-26 DIAGNOSIS — Z23 Encounter for immunization: Secondary | ICD-10-CM | POA: Diagnosis not present

## 2014-04-26 DIAGNOSIS — M1611 Unilateral primary osteoarthritis, right hip: Secondary | ICD-10-CM

## 2014-04-26 MED ORDER — MELOXICAM 15 MG PO TABS: ORAL_TABLET | ORAL | Status: DC

## 2014-04-26 MED ORDER — METRONIDAZOLE 1 % EX GEL: CUTANEOUS | Status: DC

## 2014-04-26 NOTE — Progress Notes (Signed)
   Subjective:    Patient ID: [name], female    DOB: 14-Mar-1945, 69 y.o.   MRN: [id number]  HPI Saw Dr. Alvan Dame 7 yrs ago for her right hip that occurred after she has been on a workout machine at [employer]. Pain in in the groin crease. Dr. Ouida Sills also looked at her hip and said it is severly arthritic.  She noticed she has been more pain over the last month.  Last xrays were in 2015.    [facility] - follows with Dr. Gillian Scarce. Says she was seen in Jan and then scheduled with a nutitionist and has learned to  Eat better and watch her carbs.  Review of Systems     Objective:   Physical Exam  Constitutional: She is oriented to person, place, and time. She appears well-developed and well-nourished.  Musculoskeletal:  Right hip with internal rotation.    Neurological: She is alert and oriented to person, place, and time.  Skin: Skin is warm and dry.  Psychiatric: She has a normal mood and affect. Her behavior is normal.        Assessment & Plan:  Right hip OA -  I agree, I think Dr. Alvan Dame would be great for her. He has a Corporate treasurer. Encouraged her to go and keep the appointment. She's mostly using Mavik for pain control and feels like this is adequate for now. The pain is not keeping her up at night.   impaired fasting glucose-she reports her A1c was 6.3. We will call to get records from Dr. Letitia Neri.   Given additional info about Prevnar 13

## 2014-04-27 ENCOUNTER — Encounter: Payer: Self-pay | Admitting: Family Medicine

## 2014-04-27 NOTE — Addendum Note (Signed)
Addended by: Teddy Spike on: 04/27/2014 05:11 PM   Modules accepted: Orders

## 2014-05-03 ENCOUNTER — Telehealth: Payer: Self-pay | Admitting: *Deleted

## 2014-05-03 MED ORDER — LISINOPRIL 10 MG PO TABS: 10.0000 mg | ORAL_TABLET | Freq: Every day | ORAL | Status: DC

## 2014-05-03 NOTE — Telephone Encounter (Signed)
Pt left vm this morning stating that her blood pressures have been running high since she saw you last.  She stated that this morning it was 166/99 and over the wknd it was 168/100.  I didn't see HTN on her problem list or an antihypertensive on her med list.  Please advise.

## 2014-05-03 NOTE — Telephone Encounter (Signed)
Call pt: will start lisinopril 10mg  daily.  Then follow up with me in one week.  Continue to monitor this week. Watch salt intake.

## 2014-05-04 DIAGNOSIS — M1611 Unilateral primary osteoarthritis, right hip: Secondary | ICD-10-CM | POA: Diagnosis not present

## 2014-05-04 DIAGNOSIS — M0609 Rheumatoid arthritis without rheumatoid factor, multiple sites: Secondary | ICD-10-CM | POA: Diagnosis not present

## 2014-05-04 NOTE — Telephone Encounter (Signed)
Pt notified of rx.  She was transferred to scheduling to make a f/u appt.  She wanted you to know that she cancelled her appt with Dr. Alvan Dame and has scheduled one with Dr. Maureen Ralphs for 6/2 re:her hip.

## 2014-05-13 ENCOUNTER — Ambulatory Visit: Payer: Medicare Other | Admitting: Family Medicine

## 2014-05-14 ENCOUNTER — Other Ambulatory Visit: Payer: Self-pay | Admitting: Critical Care Medicine

## 2014-05-18 DIAGNOSIS — F410 Panic disorder [episodic paroxysmal anxiety] without agoraphobia: Secondary | ICD-10-CM | POA: Diagnosis not present

## 2014-05-18 DIAGNOSIS — F902 Attention-deficit hyperactivity disorder, combined type: Secondary | ICD-10-CM | POA: Diagnosis not present

## 2014-05-18 DIAGNOSIS — F42 Obsessive-compulsive disorder: Secondary | ICD-10-CM | POA: Diagnosis not present

## 2014-05-24 ENCOUNTER — Ambulatory Visit: Payer: Medicare Other | Admitting: Hematology

## 2014-05-26 ENCOUNTER — Encounter: Payer: Self-pay | Admitting: Physician Assistant

## 2014-05-26 ENCOUNTER — Ambulatory Visit (INDEPENDENT_AMBULATORY_CARE_PROVIDER_SITE_OTHER): Payer: Medicare Other | Admitting: Physician Assistant

## 2014-05-26 ENCOUNTER — Telehealth: Payer: Self-pay | Admitting: *Deleted

## 2014-05-26 VITALS — BP 153/91 | HR 80 | Wt 178.0 lb

## 2014-05-26 DIAGNOSIS — I10 Essential (primary) hypertension: Secondary | ICD-10-CM | POA: Diagnosis not present

## 2014-05-26 DIAGNOSIS — T464X5A Adverse effect of angiotensin-converting-enzyme inhibitors, initial encounter: Secondary | ICD-10-CM

## 2014-05-26 DIAGNOSIS — R05 Cough: Secondary | ICD-10-CM | POA: Insufficient documentation

## 2014-05-26 DIAGNOSIS — R058 Other specified cough: Secondary | ICD-10-CM | POA: Insufficient documentation

## 2014-05-26 DIAGNOSIS — M1611 Unilateral primary osteoarthritis, right hip: Secondary | ICD-10-CM | POA: Diagnosis not present

## 2014-05-26 MED ORDER — LOSARTAN POTASSIUM 100 MG PO TABS: 100.0000 mg | ORAL_TABLET | Freq: Every day | ORAL | Status: DC

## 2014-05-26 MED ORDER — HYDROCODONE-ACETAMINOPHEN 5-325 MG PO TABS: 1.0000 | ORAL_TABLET | Freq: Three times a day (TID) | ORAL | Status: DC | PRN

## 2014-05-26 NOTE — Progress Notes (Signed)
   Subjective:    Patient ID: [name], female    DOB: 03-19-1945, 69 y.o.   MRN: [id number]  HPI  Pt presents to the clinic with a dry cough that started just a day or so after starting lisinopril. Dr. Madilyn Fireman sent in lisinopril for patient to start after a office visit. Pt brings in log and BP's have still ran high aroudn 150-165/90-100. Denies any CP, palpitations, headaches or vision changes. Seemed like all this happened after getting prevnar shot per pt. She has no fever, chills, SOB, wheezing, sinus pressure, ear pain or ST.   She does mention how painful her right hip is. Next appt with surgeon is June 6th. She states Dr. Suzi Roots offered her pain medication one time and wants to see if she can get an as needed supply.     Review of Systems  All other systems reviewed and are negative.      Objective:   Physical Exam  Constitutional: She is oriented to person, place, and time. She appears well-developed and well-nourished.  HENT:  Head: Normocephalic and atraumatic.  Cardiovascular: Normal rate, regular rhythm and normal heart sounds.   Pulmonary/Chest: Effort normal and breath sounds normal. She has no wheezes.  Neurological: She is alert and oriented to person, place, and time.  Skin: Skin is dry.  Psychiatric: She has a normal mood and affect. Her behavior is normal.          Assessment & Plan:  HTN/ace cough- stop lisinopril. Seems likely that is an ace cough. Certainly lung exam was normal. If cough continues follow up in clinic. Start losaartan. Given HO for medication. Continue to keep BP log and follow up with PCP in 2-4 weeks.   Primary osteoarthritis of right hip- she is walking with a cane for stability. norco given to use as needed for pain. appt with surgeon 07/19/14.

## 2014-05-26 NOTE — Patient Instructions (Signed)
Losartan tablets  What is this medicine?  LOSARTAN (loe SAR tan) is used to treat high blood pressure and to reduce the risk of stroke in certain patients. This drug also slows the progression of kidney disease in patients with diabetes.  This medicine may be used for other purposes; ask your health care provider or pharmacist if you have questions.  COMMON BRAND NAME(S): Cozaar  What should I tell my health care provider before I take this medicine?  They need to know if you have any of these conditions:  -heart failure  -kidney or liver disease  -an unusual or allergic reaction to losartan, other medicines, foods, dyes, or preservatives  -pregnant or trying to get pregnant  -breast-feeding  How should I use this medicine?  Take this medicine by mouth with a glass of water. Follow the directions on the prescription label. This medicine can be taken with or without food. Take your doses at regular intervals. Do not take your medicine more often than directed.  Talk to your pediatrician regarding the use of this medicine in children. Special care may be needed.  Overdosage: If you think you have taken too much of this medicine contact a poison control center or emergency room at once.  NOTE: This medicine is only for you. Do not share this medicine with others.  What if I miss a dose?  If you miss a dose, take it as soon as you can. If it is almost time for your next dose, take only that dose. Do not take double or extra doses.  What may interact with this medicine?  -blood pressure medicines  -diuretics, especially triamterene, spironolactone, or amiloride  -fluconazole  -NSAIDs, medicines for pain and inflammation, like ibuprofen or naproxen  -potassium salts or potassium supplements  -rifampin  This list may not describe all possible interactions. Give your health care provider a list of all the medicines, herbs, non-prescription drugs, or dietary supplements you use. Also tell them if you smoke, drink alcohol, or  use illegal drugs. Some items may interact with your medicine.  What should I watch for while using this medicine?  Visit your doctor or health care professional for regular checks on your progress. Check your blood pressure as directed. Ask your doctor or health care professional what your blood pressure should be and when you should contact him or her. Call your doctor or health care professional if you notice an irregular or fast heart beat.  Women should inform their doctor if they wish to become pregnant or think they might be pregnant. There is a potential for serious side effects to an unborn child, particularly in the second or third trimester. Talk to your health care professional or pharmacist for more information.  You may get drowsy or dizzy. Do not drive, use machinery, or do anything that needs mental alertness until you know how this drug affects you. Do not stand or sit up quickly, especially if you are an older patient. This reduces the risk of dizzy or fainting spells. Alcohol can make you more drowsy and dizzy. Avoid alcoholic drinks.  Avoid salt substitutes unless you are told otherwise by your doctor or health care professional.  Do not treat yourself for coughs, colds, or pain while you are taking this medicine without asking your doctor or health care professional for advice. Some ingredients may increase your blood pressure.  What side effects may I notice from receiving this medicine?  Side effects that you should report to   irregular heart beat, palpitations, or chest pain -skin rash, itching -swelling of your face, lips, tongue, hands, or feet Side effects that usually do not require medical attention (report to your doctor or health care  professional if they continue or are bothersome): -cough -decreased sexual function or desire -headache -nasal congestion or stuffiness -nausea or stomach pain -sore or cramping muscles This list may not describe all possible side effects. Call your doctor for medical advice about side effects. You may report side effects to FDA at 1-800-FDA-1088. Where should I keep my medicine? Keep out of the reach of children. Store at room temperature between 15 and 30 degrees C (59 and 86 degrees F). Protect from light. Keep container tightly closed. Throw away any unused medicine after the expiration date. NOTE: This sheet is a summary. It may not cover all possible information. If you have questions about this medicine, talk to your doctor, pharmacist, or health care provider.  2015, [employer] Standard. (2007-04-11 16:42:18)

## 2014-05-26 NOTE — Telephone Encounter (Signed)
Pt left vm stating that since starting the new BP med she is having a bad cough and her BP still has not gone down.  I know you are all booked so would you like for her to get in and see Luvenia Starch? Please advise.

## 2014-05-26 NOTE — Telephone Encounter (Signed)
Pt coming in to see Jade this afternoon.

## 2014-05-26 NOTE — Telephone Encounter (Signed)
The lisinopril could definitely be triggering a cough. Let's stop the medication and I will send over a new prescription. Start with half a tab daily for 5 days and then go up to a whole tab.   See if we can get her in in about a week to see if the blood pressures coming back down.

## 2014-05-27 DIAGNOSIS — M1611 Unilateral primary osteoarthritis, right hip: Secondary | ICD-10-CM | POA: Insufficient documentation

## 2014-06-02 ENCOUNTER — Encounter: Payer: Self-pay | Admitting: Family Medicine

## 2014-06-02 ENCOUNTER — Ambulatory Visit (INDEPENDENT_AMBULATORY_CARE_PROVIDER_SITE_OTHER): Payer: Medicare Other | Admitting: Family Medicine

## 2014-06-02 VITALS — BP 141/80 | HR 84 | Temp 98.7°F | Wt 176.0 lb

## 2014-06-02 DIAGNOSIS — A499 Bacterial infection, unspecified: Secondary | ICD-10-CM | POA: Diagnosis not present

## 2014-06-02 DIAGNOSIS — J329 Chronic sinusitis, unspecified: Secondary | ICD-10-CM | POA: Diagnosis not present

## 2014-06-02 DIAGNOSIS — B9689 Other specified bacterial agents as the cause of diseases classified elsewhere: Secondary | ICD-10-CM

## 2014-06-02 MED ORDER — DOXYCYCLINE HYCLATE 100 MG PO TABS: ORAL_TABLET | ORAL | Status: AC

## 2014-06-02 NOTE — Progress Notes (Signed)
CC: Samanvi Cuccia is a 69 y.o. female is here for URI?   Subjective: HPI:  Facial pressure and nasal congestion postnasal drip that has been present for about a week. On Sunday it seemed to have peaked and is now moderate in severity. On Sunday he was accompanied by subjective fevers chills and body aches. Head now feels full with pain localized in the forehead. No benefit from over-the-counter cough and cold medication or starting Flonase. No other interventions as of yet. Fatigue to the point where she spends most the time in bed which is out of character she tells me. No wheezing, shortness of breath, cough or chest pain.   Review Of Systems Outlined In HPI  Past Medical History  Diagnosis Date  . Other and unspecified hyperlipidemia   . Unspecified arthropathy, hand   . Peptic ulcer, unspecified site, unspecified as acute or chronic, without mention of hemorrhage, perforation, or obstruction   . Allergic rhinitis, cause unspecified   . Unspecified asthma(493.90)   . Benign paroxysmal positional vertigo   . Depressive disorder, not elsewhere classified   . Esophageal reflux   . Unspecified hypothyroidism   . Localized osteoarthrosis not specified whether primary or secondary, lower leg   . Other diseases of vocal cords     Past Surgical History  Procedure Laterality Date  . Nasal sinus surgery    . Knee arthroscopy    . Rotator cuff repair     Family History  Problem Relation Age of Onset  . Heart disease Father   . Colon cancer Mother   . Liver cancer Brother   . Asthma Brother     History   Social History  . Marital Status: Married    Spouse Name: N/A  . Number of Children: 3  . Years of Education: N/A   Occupational History  . Retired    Social History Main Topics  . Smoking status: Former Smoker -- 0.30 packs/day for 2 years    Types: Cigarettes    Quit date: 02/12/1958  . Smokeless tobacco: Never Used  . Alcohol Use: Yes     Comment: wine socially  .  Drug Use: No  . Sexual Activity: Yes   Other Topics Concern  . Not on file   Social History Narrative     Objective: BP 141/80 mmHg  Pulse 84  Temp(Src) 98.7 F (37.1 C) (Oral)  Wt 176 lb (79.833 kg)  SpO2 95%  General: Alert and Oriented, No Acute Distress HEENT: Pupils equal, round, reactive to light. Conjunctivae clear.  External ears unremarkable, canals clear with intact TMs with appropriate landmarks.  Middle ear appears open without effusion. Pink inferior turbinateswith moderate mucoid discharge.  Moist mucous membranes, pharynx without inflammation nor lesions.  Neck supple without palpable lymphadenopathy nor abnormal masses. Lungs: Clear to auscultation bilaterally, no wheezing/ronchi/rales.  Comfortable work of breathing. Good air movement. Cardiac: Regular rate and rhythm. Normal S1/S2.  No murmurs, rubs, nor gallops.   Mental Status: No depression, anxiety, nor agitation. Skin: Warm and dry.  Assessment & Plan: Elizebath was seen today for uri?.  Diagnoses and all orders for this visit:  Bacterial sinusitis Orders: -     doxycycline (VIBRA-TABS) 100 MG tablet; One by mouth twice a day for ten days.   Spectral sinusitis: Start doxycycline continue Flonase and consider nasal saline washes.  Return if symptoms worsen or fail to improve.

## 2014-06-15 DIAGNOSIS — F902 Attention-deficit hyperactivity disorder, combined type: Secondary | ICD-10-CM | POA: Diagnosis not present

## 2014-06-15 DIAGNOSIS — F42 Obsessive-compulsive disorder: Secondary | ICD-10-CM | POA: Diagnosis not present

## 2014-06-15 DIAGNOSIS — F410 Panic disorder [episodic paroxysmal anxiety] without agoraphobia: Secondary | ICD-10-CM | POA: Diagnosis not present

## 2014-06-24 DIAGNOSIS — R7301 Impaired fasting glucose: Secondary | ICD-10-CM | POA: Diagnosis not present

## 2014-06-24 DIAGNOSIS — E780 Pure hypercholesterolemia: Secondary | ICD-10-CM | POA: Diagnosis not present

## 2014-06-24 DIAGNOSIS — E559 Vitamin D deficiency, unspecified: Secondary | ICD-10-CM | POA: Diagnosis not present

## 2014-06-24 DIAGNOSIS — R5382 Chronic fatigue, unspecified: Secondary | ICD-10-CM | POA: Diagnosis not present

## 2014-06-24 DIAGNOSIS — E038 Other specified hypothyroidism: Secondary | ICD-10-CM | POA: Diagnosis not present

## 2014-06-25 ENCOUNTER — Ambulatory Visit (INDEPENDENT_AMBULATORY_CARE_PROVIDER_SITE_OTHER): Payer: Medicare Other | Admitting: Family Medicine

## 2014-06-25 ENCOUNTER — Encounter: Payer: Self-pay | Admitting: Family Medicine

## 2014-06-25 VITALS — BP 140/82 | HR 70 | Ht 63.0 in | Wt 176.0 lb

## 2014-06-25 DIAGNOSIS — R05 Cough: Secondary | ICD-10-CM

## 2014-06-25 DIAGNOSIS — M25551 Pain in right hip: Secondary | ICD-10-CM

## 2014-06-25 DIAGNOSIS — T464X5A Adverse effect of angiotensin-converting-enzyme inhibitors, initial encounter: Secondary | ICD-10-CM

## 2014-06-25 DIAGNOSIS — R058 Other specified cough: Secondary | ICD-10-CM

## 2014-06-25 DIAGNOSIS — R7301 Impaired fasting glucose: Secondary | ICD-10-CM

## 2014-06-25 DIAGNOSIS — I10 Essential (primary) hypertension: Secondary | ICD-10-CM | POA: Diagnosis not present

## 2014-06-25 LAB — POCT GLYCOSYLATED HEMOGLOBIN (HGB A1C): Hemoglobin A1C: 6.1

## 2014-06-25 MED ORDER — LOSARTAN POTASSIUM-HCTZ 100-25 MG PO TABS: 1.0000 | ORAL_TABLET | Freq: Every day | ORAL | Status: DC

## 2014-06-25 NOTE — Patient Instructions (Signed)
Go in one week after start new medication for the labwork.

## 2014-06-25 NOTE — Progress Notes (Signed)
   Subjective:    Patient ID: [name], female    DOB: [birth year], 69 y.o.   MRN: [id number]  HPI Hypertension- Pt denies chest pain, SOB, dizziness, or heart palpitations.  Taking meds as directed w/o problems.  Denies medication side effects.  Her lisinopril was discontinued 4 weeks ago and she was switched to losartan because 8 a cough from the ACE inhibitor. Home BPs have been runnin gin the 150/80s. This week mostly in the 140/80s.   Acute sinusitis - she is feeling much better after doxycyline. Her voice is still raspy.    Over the weekend got a stomach virus from her grandson  She started vomiting on Sat and then on Sun. Has been gradually getting better.   She had some questions about the hydrocodone that she was given for hip pain. She has made an appointment with Dr.: And we'll see him in about 4 weeks. She has been hesitant to take the medication.  Review of Systems     Objective:   Physical Exam  Constitutional: She is oriented to person, place, and time. She appears well-developed and well-nourished.  HENT:  Head: Normocephalic and atraumatic.  Cardiovascular: Normal rate, regular rhythm and normal heart sounds.   Pulmonary/Chest: Effort normal and breath sounds normal.  Neurological: She is alert and oriented to person, place, and time.  Skin: Skin is warm and dry.  Psychiatric: She has a normal mood and affect. Her behavior is normal.          Assessment & Plan:  HTN -  will add hydrochlorothiazide to her regimen. Follow-up in 6 weeks. She will go for BMP in one week. Next  ACE inhibitor cough-resolve now on an ARB.  Acute sinusitis-resolved.   Right hip pain-okay to use the hydrocodone. Recommend that she start with a half a tab since she is 58 this type of medication. Also discussed with her the appropriate dose of Tylenol daily which is perfectly safe to use it is a good daily mainstay pain reliever as long as it's 3 g or less per day.  Impaired  fasting glucose-hemoglobin A1c today stable today. F/U in 6 months.  Continue to work on diet and exercise.

## 2014-06-29 DIAGNOSIS — E038 Other specified hypothyroidism: Secondary | ICD-10-CM | POA: Diagnosis not present

## 2014-06-29 DIAGNOSIS — R5382 Chronic fatigue, unspecified: Secondary | ICD-10-CM | POA: Diagnosis not present

## 2014-06-29 DIAGNOSIS — R7301 Impaired fasting glucose: Secondary | ICD-10-CM | POA: Diagnosis not present

## 2014-06-29 DIAGNOSIS — L659 Nonscarring hair loss, unspecified: Secondary | ICD-10-CM | POA: Diagnosis not present

## 2014-06-29 DIAGNOSIS — E559 Vitamin D deficiency, unspecified: Secondary | ICD-10-CM | POA: Diagnosis not present

## 2014-06-29 DIAGNOSIS — E780 Pure hypercholesterolemia: Secondary | ICD-10-CM | POA: Diagnosis not present

## 2014-07-14 DIAGNOSIS — I10 Essential (primary) hypertension: Secondary | ICD-10-CM | POA: Diagnosis not present

## 2014-07-15 LAB — BASIC METABOLIC PANEL
BUN: 19 mg/dL (ref 6–23)
CALCIUM: 9.3 mg/dL (ref 8.4–10.5)
CO2: 30 mEq/L (ref 19–32)
Chloride: 102 mEq/L (ref 96–112)
Creat: 0.71 mg/dL (ref 0.50–1.10)
Glucose, Bld: 100 mg/dL — ABNORMAL HIGH (ref 70–99)
Potassium: 4.6 mEq/L (ref 3.5–5.3)
SODIUM: 139 meq/L (ref 135–145)

## 2014-07-15 NOTE — Progress Notes (Signed)
Quick Note:  All labs are normal. ______ 

## 2014-07-16 DIAGNOSIS — M25551 Pain in right hip: Secondary | ICD-10-CM | POA: Diagnosis not present

## 2014-07-16 DIAGNOSIS — M1611 Unilateral primary osteoarthritis, right hip: Secondary | ICD-10-CM | POA: Diagnosis not present

## 2014-07-19 ENCOUNTER — Telehealth: Payer: Self-pay | Admitting: Critical Care Medicine

## 2014-07-19 NOTE — Telephone Encounter (Signed)
Spoke with pt. She reports PW wanted her to call when she had her surgery scheduled. It is for 6/28 by Dr. Alvan Dame. FYI for Dr. Joya Gaskins

## 2014-07-19 NOTE — Telephone Encounter (Signed)
noted 

## 2014-07-20 DIAGNOSIS — F902 Attention-deficit hyperactivity disorder, combined type: Secondary | ICD-10-CM | POA: Diagnosis not present

## 2014-07-20 DIAGNOSIS — F42 Obsessive-compulsive disorder: Secondary | ICD-10-CM | POA: Diagnosis not present

## 2014-07-20 DIAGNOSIS — F410 Panic disorder [episodic paroxysmal anxiety] without agoraphobia: Secondary | ICD-10-CM | POA: Diagnosis not present

## 2014-07-26 DIAGNOSIS — M1611 Unilateral primary osteoarthritis, right hip: Secondary | ICD-10-CM | POA: Diagnosis not present

## 2014-07-29 DIAGNOSIS — M1611 Unilateral primary osteoarthritis, right hip: Secondary | ICD-10-CM | POA: Diagnosis not present

## 2014-08-02 ENCOUNTER — Ambulatory Visit: Payer: Medicare Other | Admitting: Family Medicine

## 2014-08-02 NOTE — H&P (Signed)
TOTAL HIP ADMISSION H&P  Patient is admitted for right total hip arthroplasty, anterior approach.  Subjective:  Chief Complaint:    Right hip osteoarthritis / pain  HPI: [name], 69 y.o. female, has a history of pain and functional disability in the right hip(s) due to arthritis and patient has failed non-surgical conservative treatments for greater than 12 weeks to include NSAID's and/or analgesics, corticosteriod injections, use of assistive devices and activity modification.  Onset of symptoms was gradual starting 6.5 years ago with gradually worsening course since that time.The patient noted no past surgery on the right hip(s).  Patient currently rates pain in the right hip at 10 out of 10 with activity. Patient has night pain, worsening of pain with activity and weight bearing, trendelenberg gait, pain that interfers with activities of daily living and pain with passive range of motion. Patient has evidence of periarticular osteophytes and joint space narrowing by imaging studies. This condition presents safety issues increasing the risk of falls. There is no current active infection.  Risks, benefits and expectations were discussed with the patient.  Risks including but not limited to the risk of anesthesia, blood clots, nerve damage, blood vessel damage, failure of the prosthesis, infection and up to and including death.  Patient understand the risks, benefits and expectations and wishes to proceed with surgery.   PCP: METHENEY,CATHERINE, MD  D/C Plans:      Home with HHPT  Post-op Meds:       No Rx given  Tranexamic Acid:      To be given - IV   Decadron:      Is to be given  FYI:     Xarelto post-op  (ASA causes GI upset)  Norco  IV toradol (allergy to morphine)   Patient Active Problem List   Diagnosis Date Noted  . Primary osteoarthritis of right hip 05/27/2014  . ACE-inhibitor cough 05/26/2014  . Essential hypertension, benign 05/26/2014  . IFG (impaired fasting  glucose) 01/25/2014  . Dyspnea 06/26/2013  . Vitreous detachment 09/23/2012  . Oral herpes 06/23/2012  . OCD (obsessive compulsive disorder) 05/09/2012  . Atypical ductal hyperplasia of breast 10/29/2011  . Abnormal mammogram with microcalcification 10/04/2011  . Obesity (BMI 30-39.9) 05/03/2011  . CARRIER/SUSPECTED CARRIER GROUP B STREPTOCOCCUS 11/17/2010  . Abnormal liver enzymes 10/25/2010  . SOMNOLENCE 11/25/2009  . Rheumatoid arthritis with rheumatoid factor 11/30/2008  . POSTMENOPAUSAL STATUS 11/30/2008  . ARTHRITIS, HANDS, BILATERAL 12/22/2007  . LOC OSTEOARTHROS NOT SPEC PRIM/SEC LOWER LEG 03/19/2007  . ALLERGIC RHINITIS 11/05/2006  . VOCAL CORD DISORDER 11/05/2006  . Asthma with allergic rhinitis 11/05/2006  . HYPOTHYROIDISM 09/24/2006  . DEPRESSION 09/24/2006  . VERTIGO, BENIGN PAROXYSMAL POSITION 09/24/2006  . GERD 09/24/2006  . HYPERLIPIDEMIA 09/23/2006   Past Medical History  Diagnosis Date  . Other and unspecified hyperlipidemia   . Unspecified arthropathy, hand   . Peptic ulcer, unspecified site, unspecified as acute or chronic, without mention of hemorrhage, perforation, or obstruction   . Allergic rhinitis, cause unspecified   . Unspecified asthma(493.90)   . Benign paroxysmal positional vertigo   . Depressive disorder, not elsewhere classified   . Esophageal reflux   . Unspecified hypothyroidism   . Localized osteoarthrosis not specified whether primary or secondary, lower leg   . Other diseases of vocal cords     Past Surgical History  Procedure Laterality Date  . Nasal sinus surgery    . Knee arthroscopy    . Rotator cuff repair  No prescriptions prior to admission   Allergies  Allergen Reactions  . Astelin [Azelastine Hcl] Other (See Comments)    Headache  . Lisinopril     ACE cough  . Morphine     REACTION: Nausea  . Moxifloxacin     REACTION: confusion, dizziness, paranoia    History  Substance Use Topics  . Smoking status: Former  Smoker -- 0.30 packs/day for 2 years    Types: Cigarettes    Quit date: 02/12/1958  . Smokeless tobacco: Never Used  . Alcohol Use: Yes     Comment: wine socially    Family History  Problem Relation Age of Onset  . Heart disease Father   . Colon cancer Mother   . Liver cancer Brother   . Asthma Brother      Review of Systems  Constitutional: Negative.   HENT: Negative.   Eyes: Negative.   Respiratory: Negative.   Cardiovascular: Negative.   Gastrointestinal: Positive for heartburn.  Genitourinary: Negative.   Musculoskeletal: Positive for joint pain.  Skin: Negative.   Neurological: Negative.   Endo/Heme/Allergies: Positive for environmental allergies.  Psychiatric/Behavioral: Positive for depression.    Objective:  Physical Exam  Constitutional: She is oriented to person, place, and time. She appears well-developed and well-nourished.  HENT:  Head: Normocephalic.  Eyes: Pupils are equal, round, and reactive to light.  Neck: Neck supple. No JVD present. No tracheal deviation present. No thyromegaly present.  Cardiovascular: Normal rate, regular rhythm, normal heart sounds and intact distal pulses.   Respiratory: Effort normal and breath sounds normal. No stridor. No respiratory distress. She has no wheezes.  GI: Soft. There is no tenderness. There is no guarding.  Musculoskeletal:       Right hip: She exhibits decreased range of motion, decreased strength, tenderness and bony tenderness. She exhibits no swelling, no deformity and no laceration.  Lymphadenopathy:    She has no cervical adenopathy.  Neurological: She is alert and oriented to person, place, and time.  Skin: Skin is warm and dry.  Psychiatric: She has a normal mood and affect.      Labs:  Estimated body mass index is 31.18 kg/(m^2) as calculated from the following:   Height as of 06/25/14: 5\' 3"  (1.6 m).   Weight as of 06/25/14: 79.833 kg (176 lb).   Imaging Review Plain radiographs demonstrate  severe degenerative joint disease of the right hip(s). The bone quality appears to be good for age and reported activity level.  Assessment/Plan:  End stage arthritis, right hip(s)  The patient history, physical examination, clinical judgement of the provider and imaging studies are consistent with end stage degenerative joint disease of the right hip(s) and total hip arthroplasty is deemed medically necessary. The treatment options including medical management, injection therapy, arthroscopy and arthroplasty were discussed at length. The risks and benefits of total hip arthroplasty were presented and reviewed. The risks due to aseptic loosening, infection, stiffness, dislocation/subluxation,  thromboembolic complications and other imponderables were discussed.  The patient acknowledged the explanation, agreed to proceed with the plan and consent was signed. Patient is being admitted for inpatient treatment for surgery, pain control, PT, OT, prophylactic antibiotics, VTE prophylaxis, progressive ambulation and ADL's and discharge planning.The patient is planning to be discharged home with home health services.    [place] Pugh Lorenzo Arscott   PA-C  08/02/2014, 3:33 PM

## 2014-08-03 ENCOUNTER — Ambulatory Visit (INDEPENDENT_AMBULATORY_CARE_PROVIDER_SITE_OTHER): Payer: Medicare Other | Admitting: Sports Medicine

## 2014-08-03 ENCOUNTER — Encounter: Payer: Self-pay | Admitting: Sports Medicine

## 2014-08-03 VITALS — BP 127/79 | HR 82 | Ht 63.0 in | Wt 178.0 lb

## 2014-08-03 DIAGNOSIS — Z01818 Encounter for other preprocedural examination: Secondary | ICD-10-CM | POA: Diagnosis not present

## 2014-08-03 NOTE — Assessment & Plan Note (Addendum)
Total hip arthroplasty is upcoming, this is considered intermediate risk noncardiac surgery, and she is planning on spinal anesthesia with sedation. Patient has greater than 4 METS of cardiac tolerance, she also had a nuclear stress test approximately 6 months ago, which was negative, clearing her from a cardiac standpoint. Blood pressure has been well-controlled. She does have well-controlled diabetes. From a pulmonary standpoint her asthma is under good control. EKG does show some incomplete right bundle branch block but is completely unchanged from prior EKG the year prior. She is fully cleared, and may proceed without any other interventions.

## 2014-08-03 NOTE — Progress Notes (Signed)
  Subjective:    CC: Preoperative clearance  HPI: This is a pleasant 69 year old female, she has been through exhaustive workup for hip osteoarthritis, with multiple treatments including hip joint injections under guidance, she has not responded to significant amount of time and has been scheduled for a total hip arthroplasty. She is here for surgical clearance, she has the ability to walk up 2 flights of steps without any problems, she had a nuclear stress test 6 months ago that was negative, her asthma as well as diabetes and hypertension are well controlled.  Past medical history, Surgical history, Family history not pertinant except as noted below, Social history, Allergies, and medications have been entered into the medical record, reviewed, and no changes needed.   Review of Systems: No fevers, chills, night sweats, weight loss, chest pain, or shortness of breath.   Objective:    General: Well Developed, well nourished, and in no acute distress.  Neuro: Alert and oriented x3, extra-ocular muscles intact, sensation grossly intact.  HEENT: Normocephalic, atraumatic, pupils equal round reactive to light, neck supple, no masses, no lymphadenopathy, thyroid nonpalpable.  Skin: Warm and dry, no rashes. Cardiac: Regular rate and rhythm, no murmurs rubs or gallops, no lower extremity edema.  Respiratory: Clear to auscultation bilaterally. Not using accessory muscles, speaking in full sentences.  Twelve-lead electrocardiogram shows a rate of 71, there is left axis deviation, there is an evidence of right bundle branch block, no ST changes, and completely unchanged from EKG one year ago.  Impression and Recommendations:    I spent 40 minutes with this patient, 50% was face-to-face time counseling regarding the above diagnoses

## 2014-08-04 NOTE — Patient Instructions (Addendum)
[name]  08/04/2014   Your procedure is scheduled on:  08-10-14  Report to [facility] Main  Entrance take [facility]  elevators to 3rd floor to  [place] at  7:05 AM.  Call this number if you have problems the morning of surgery [phone]   Remember: ONLY 1 PERSON MAY GO WITH YOU TO SHORT STAY TO GET  READY MORNING OF [place].  Do not eat food or drink liquids :After Midnight.     Take these medicines the morning of surgery with A SIP OF WATER: Albuterol inhaler and bring , Wellbutrin XL, Klonopin, Flonase, Hydrocodone of needed, Levothyroxine,              Zegerid, Paxil-CR, Pulmicort Flexhaler, Saline spray if needed                               You may not have any metal on your body including hair pins and              piercings  Do not wear jewelry, make-up, lotions, powders or perfumes, deodorant             Do not wear nail polish.  Do not shave  48 hours prior to surgery.             Do not bring valuables to the hospital. [place].  Contacts, dentures or bridgework may not be worn into surgery.  Leave suitcase in the car. After surgery it may be brought to your room.       :  Special Instructions: coughing and deep breathing exercises, leg exercises              Please read over the following fact sheets you were given:MRSA INFORMATION SHEET, BLOOD TRANSFUSION FACT SHEET, INCENTIVE SPIROMETER  _____________________________________________________________________              WHAT IS A BLOOD TRANSFUSION? Blood Transfusion Information  A transfusion is the replacement of blood or some of its parts. Blood is made up of multiple cells which provide different functions.  Red blood cells carry oxygen and are used for blood loss replacement.  White blood cells fight against infection.  Platelets control bleeding.  Plasma helps clot blood.  Other blood products are available  for specialized needs, such as hemophilia or other clotting disorders. BEFORE THE TRANSFUSION  Who gives blood for transfusions?   Healthy volunteers who are fully evaluated to make sure their blood is safe. This is blood bank blood. Transfusion therapy is the safest it has ever been in the practice of medicine. Before blood is taken from a donor, a complete history is taken to make sure that person has no history of diseases nor engages in risky social behavior (examples are intravenous drug use or sexual activity with multiple partners). The donor's travel history is screened to minimize risk of transmitting infections, such as malaria. The donated blood is tested for signs of infectious diseases, such as HIV and hepatitis. The blood is then tested to be sure it is compatible with you in order to minimize the chance of a transfusion reaction. If you or a relative donates blood, this is often done in anticipation of surgery and is not appropriate  for emergency situations. It takes many days to process the donated blood. RISKS AND COMPLICATIONS Although transfusion therapy is very safe and saves many lives, the main dangers of transfusion include:  1. Getting an infectious disease. 2. Developing a transfusion reaction. This is an allergic reaction to something in the blood you were given. Every precaution is taken to prevent this. The decision to have a blood transfusion has been considered carefully by your caregiver before blood is given. Blood is not given unless the benefits outweigh the risks. AFTER THE TRANSFUSION  Right after receiving a blood transfusion, you will usually feel much better and more energetic. This is especially true if your red blood cells have gotten low (anemic). The transfusion raises the level of the red blood cells which carry oxygen, and this usually causes an energy increase.  The nurse administering the transfusion will monitor you carefully for complications. HOME  CARE INSTRUCTIONS  No special instructions are needed after a transfusion. You may find your energy is better. Speak with your caregiver about any limitations on activity for underlying diseases you may have. SEEK MEDICAL CARE IF:   Your condition is not improving after your transfusion.  You develop redness or irritation at the intravenous (IV) site. SEEK IMMEDIATE MEDICAL CARE IF:  Any of the following symptoms occur over the next 12 hours:  Shaking chills.  You have a temperature by mouth above 102 F (38.9 C), not controlled by medicine.  Chest, back, or muscle pain.  People around you feel you are not acting correctly or are confused.  Shortness of breath or difficulty breathing.  Dizziness and fainting.  You get a rash or develop hives.  You have a decrease in urine output.  Your urine turns a dark color or changes to pink, red, or brown. Any of the following symptoms occur over the next 10 days:  You have a temperature by mouth above 102 F (38.9 C), not controlled by medicine.  Shortness of breath.  Weakness after normal activity.  The white part of the eye turns yellow (jaundice).  You have a decrease in the amount of urine or are urinating less often.  Your urine turns a dark color or changes to pink, red, or brown. Document Released: 01/27/2000 Document Revised: 04/23/2011 Document Reviewed: 09/15/2007 ExitCare Patient Information 2014 [place].  _______________________________________________________________________[facility] - Preparing for Surgery Before surgery, you can play an important role.  Because skin is not sterile, your skin needs to be as free of germs as possible.  You can reduce the number of germs on your skin by washing with CHG (chlorahexidine gluconate) soap before surgery.  CHG is an antiseptic cleaner which kills germs and bonds with the skin to continue killing germs even after washing. Please DO NOT use if you have an allergy to  CHG or antibacterial soaps.  If your skin becomes reddened/irritated stop using the CHG and inform your nurse when you arrive at Short Stay. Do not shave (including legs and underarms) for at least 48 hours prior to the first CHG shower.  You may shave your face/neck. Please follow these instructions carefully:  1.  Shower with CHG Soap the night before surgery and the  morning of Surgery.  2.  If you choose to wash your hair, wash your hair first as usual with your  normal  shampoo.  3.  After you shampoo, rinse your hair and body thoroughly to remove the  shampoo.  4.  Use CHG as you would any other liquid soap.  You can apply chg directly  to the skin and wash                       Gently with a scrungie or clean washcloth.  5.  Apply the CHG Soap to your body ONLY FROM THE NECK DOWN.   Do not use on face/ open                           Wound or open sores. Avoid contact with eyes, ears mouth and genitals (private parts).                       Wash face,  Genitals (private parts) with your normal soap.             6.  Wash thoroughly, paying special attention to the area where your surgery  will be performed.  7.  Thoroughly rinse your body with warm water from the neck down.  8.  DO NOT shower/wash with your normal soap after using and rinsing off  the CHG Soap.                9.  Pat yourself dry with a clean towel.            10.  Wear clean pajamas.            11.  Place clean sheets on your bed the night of your first shower and do not  sleep with pets. Day of Surgery : Do not apply any lotions/deodorants the morning of surgery.  Please wear clean clothes to the hospital/surgery center.  FAILURE TO FOLLOW THESE INSTRUCTIONS MAY RESULT IN THE CANCELLATION OF YOUR SURGERY PATIENT SIGNATURE_________________________________  NURSE SIGNATURE__________________________________  ________________________________________________________________________   Adam Phenix  An incentive spirometer is a tool that can help keep your lungs clear and active. This tool measures how well you are filling your lungs with each breath. Taking long deep breaths may help reverse or decrease the chance of developing breathing (pulmonary) problems (especially infection) following:  A long period of time when you are unable to move or be active. BEFORE THE PROCEDURE   If the spirometer includes an indicator to show your best effort, your nurse or respiratory therapist will set it to a desired goal.  If possible, sit up straight or lean slightly forward. Try not to slouch.  Hold the incentive spirometer in an upright position. INSTRUCTIONS FOR USE  3. Sit on the edge of your bed if possible, or sit up as far as you can in bed or on a chair. 4. Hold the incentive spirometer in an upright position. 5. Breathe out normally. 6. Place the mouthpiece in your mouth and seal your lips tightly around it. 7. Breathe in slowly and as deeply as possible, raising the piston or the ball toward the top of the column. 8. Hold your breath for 3-5 seconds or for as long as possible. Allow the piston or ball to fall to the bottom of the column. 9. Remove the mouthpiece from your mouth and breathe out normally. 10. Rest for a few seconds and repeat Steps 1 through 7 at least 10 times every 1-2 hours when you are awake. Take your time and take a few normal breaths between deep breaths. 11. The spirometer may include an indicator to show  your best effort. Use the indicator as a goal to work toward during each repetition. 12. After each set of 10 deep breaths, practice coughing to be sure your lungs are clear. If you have an incision (the cut made at the time of surgery), support your incision when coughing by placing a pillow or rolled up towels firmly against it. Once you are able to get out of bed, walk around indoors and cough well. You may stop using the incentive spirometer when  instructed by your caregiver.  RISKS AND COMPLICATIONS  Take your time so you do not get dizzy or light-headed.  If you are in pain, you may need to take or ask for pain medication before doing incentive spirometry. It is harder to take a deep breath if you are having pain. AFTER USE  Rest and breathe slowly and easily.  It can be helpful to keep track of a log of your progress. Your caregiver can provide you with a simple table to help with this. If you are using the spirometer at home, follow these instructions: [place] IF:   You are having difficultly using the spirometer.  You have trouble using the spirometer as often as instructed.  Your pain medication is not giving enough relief while using the spirometer.  You develop fever of 100.5 F (38.1 C) or higher. SEEK IMMEDIATE MEDICAL CARE IF:   You cough up bloody sputum that had not been present before.  You develop fever of 102 F (38.9 C) or greater.  You develop worsening pain at or near the incision site. MAKE SURE YOU:   Understand these instructions.  Will watch your condition.  Will get help right away if you are not doing well or get worse. Document Released: 06/11/2006 Document Revised: 04/23/2011 Document Reviewed: 08/12/2006 [facility] Patient Information 2014 [place], [place].   ________________________________________________________________________

## 2014-08-05 ENCOUNTER — Encounter (HOSPITAL_COMMUNITY): Payer: Self-pay

## 2014-08-05 ENCOUNTER — Encounter (HOSPITAL_COMMUNITY)
Admission: RE | Admit: 2014-08-05 | Discharge: 2014-08-05 | Disposition: A | Discharge status: Home / Self Care | Payer: Medicare Other | Source: Ambulatory Visit | Attending: Orthopedic Surgery | Admitting: Orthopedic Surgery

## 2014-08-05 DIAGNOSIS — M1711 Unilateral primary osteoarthritis, right knee: Secondary | ICD-10-CM | POA: Insufficient documentation

## 2014-08-05 DIAGNOSIS — Z01812 Encounter for preprocedural laboratory examination: Secondary | ICD-10-CM | POA: Insufficient documentation

## 2014-08-05 HISTORY — DX: Essential (primary) hypertension: I10

## 2014-08-05 HISTORY — DX: Personal history of other diseases of the digestive system: Z87.19

## 2014-08-05 HISTORY — DX: Unspecified temporomandibular joint disorder, unspecified side: M26.609

## 2014-08-05 HISTORY — DX: Malignant (primary) neoplasm, unspecified: C80.1

## 2014-08-05 HISTORY — DX: Type 2 diabetes mellitus without complications: E11.9

## 2014-08-05 HISTORY — DX: Anxiety disorder, unspecified: F41.9

## 2014-08-05 HISTORY — DX: Reserved for inherently not codable concepts without codable children: IMO0001

## 2014-08-05 HISTORY — DX: Pneumonia, unspecified organism: J18.9

## 2014-08-05 LAB — CBC
HCT: 38.2 % (ref 36.0–46.0)
HEMOGLOBIN: 12.5 g/dL (ref 12.0–15.0)
MCH: 28.5 pg (ref 26.0–34.0)
MCHC: 32.7 g/dL (ref 30.0–36.0)
MCV: 87 fL (ref 78.0–100.0)
PLATELETS: 323 10*3/uL (ref 150–400)
RBC: 4.39 MIL/uL (ref 3.87–5.11)
RDW: 13.7 % (ref 11.5–15.5)
WBC: 6.2 10*3/uL (ref 4.0–10.5)

## 2014-08-05 LAB — SURGICAL PCR SCREEN
MRSA, PCR: NEGATIVE
Staphylococcus aureus: NEGATIVE

## 2014-08-05 LAB — ABO/RH: ABO/RH(D): O POS

## 2014-08-05 LAB — APTT: aPTT: 32 seconds (ref 24–37)

## 2014-08-05 LAB — PROTIME-INR
INR: 1.04 (ref 0.00–1.49)
Prothrombin Time: 13.8 seconds (ref 11.6–15.2)

## 2014-08-05 NOTE — Progress Notes (Addendum)
Clearance note per chart per Dr Suzi Roots on chart  Clearance note per chart per Dr Dianah Field 08/03/2014  EKG per chart 08/03/2014  BMP per epic 07/14/2014  08/25/2013 per epic OV note per Dr Percival Spanish (cardiology) OV note per Dr Joya Gaskins per epic (pulmonary) 05/01/2014  Stress test epic 07/21/2013 ECHO per epic 06/23/2013

## 2014-08-06 ENCOUNTER — Ambulatory Visit: Payer: Medicare Other | Admitting: Family Medicine

## 2014-08-10 ENCOUNTER — Encounter (HOSPITAL_COMMUNITY)
Admission: RE | Disposition: A | Discharge status: Home Health | Payer: Self-pay | Source: Ambulatory Visit | Attending: Orthopedic Surgery

## 2014-08-10 ENCOUNTER — Ambulatory Visit: Payer: Medicare Other | Admitting: Family Medicine

## 2014-08-10 ENCOUNTER — Encounter (HOSPITAL_COMMUNITY): Payer: Self-pay | Admitting: *Deleted

## 2014-08-10 ENCOUNTER — Inpatient Hospital Stay (HOSPITAL_COMMUNITY): Payer: Medicare Other | Admitting: Registered Nurse

## 2014-08-10 ENCOUNTER — Inpatient Hospital Stay (HOSPITAL_COMMUNITY): Payer: Medicare Other

## 2014-08-10 ENCOUNTER — Inpatient Hospital Stay (HOSPITAL_COMMUNITY)
Admission: RE | Admit: 2014-08-10 | Discharge: 2014-08-11 | DRG: 470 | Disposition: A | Discharge status: Home Health | Payer: Medicare Other | Source: Ambulatory Visit | Attending: Orthopedic Surgery | Admitting: Orthopedic Surgery

## 2014-08-10 DIAGNOSIS — Z8711 Personal history of peptic ulcer disease: Secondary | ICD-10-CM | POA: Diagnosis not present

## 2014-08-10 DIAGNOSIS — Z01812 Encounter for preprocedural laboratory examination: Secondary | ICD-10-CM | POA: Diagnosis not present

## 2014-08-10 DIAGNOSIS — E669 Obesity, unspecified: Secondary | ICD-10-CM | POA: Diagnosis present

## 2014-08-10 DIAGNOSIS — Z6831 Body mass index (BMI) 31.0-31.9, adult: Secondary | ICD-10-CM

## 2014-08-10 DIAGNOSIS — Z87891 Personal history of nicotine dependence: Secondary | ICD-10-CM

## 2014-08-10 DIAGNOSIS — K219 Gastro-esophageal reflux disease without esophagitis: Secondary | ICD-10-CM | POA: Diagnosis present

## 2014-08-10 DIAGNOSIS — Z8249 Family history of ischemic heart disease and other diseases of the circulatory system: Secondary | ICD-10-CM

## 2014-08-10 DIAGNOSIS — I10 Essential (primary) hypertension: Secondary | ICD-10-CM | POA: Diagnosis present

## 2014-08-10 DIAGNOSIS — E785 Hyperlipidemia, unspecified: Secondary | ICD-10-CM | POA: Diagnosis present

## 2014-08-10 DIAGNOSIS — J45909 Unspecified asthma, uncomplicated: Secondary | ICD-10-CM | POA: Diagnosis present

## 2014-08-10 DIAGNOSIS — M1611 Unilateral primary osteoarthritis, right hip: Secondary | ICD-10-CM | POA: Diagnosis not present

## 2014-08-10 DIAGNOSIS — M25551 Pain in right hip: Secondary | ICD-10-CM | POA: Diagnosis not present

## 2014-08-10 DIAGNOSIS — Z96641 Presence of right artificial hip joint: Secondary | ICD-10-CM | POA: Diagnosis not present

## 2014-08-10 DIAGNOSIS — M169 Osteoarthritis of hip, unspecified: Secondary | ICD-10-CM | POA: Diagnosis not present

## 2014-08-10 DIAGNOSIS — Z471 Aftercare following joint replacement surgery: Secondary | ICD-10-CM | POA: Diagnosis not present

## 2014-08-10 DIAGNOSIS — Z96649 Presence of unspecified artificial hip joint: Secondary | ICD-10-CM

## 2014-08-10 HISTORY — PX: TOTAL HIP ARTHROPLASTY: SHX124

## 2014-08-10 LAB — TYPE AND SCREEN
ABO/RH(D): O POS
ANTIBODY SCREEN: NEGATIVE

## 2014-08-10 LAB — GLUCOSE, CAPILLARY: Glucose-Capillary: 97 mg/dL (ref 65–99)

## 2014-08-10 SURGERY — ARTHROPLASTY, HIP, TOTAL, ANTERIOR APPROACH
Anesthesia: Spinal | Site: Hip | Laterality: Right

## 2014-08-10 MED ORDER — PROPOFOL 10 MG/ML IV BOLUS
INTRAVENOUS | Status: DC | PRN
  Administered 2014-08-10: 50 mg via INTRAVENOUS

## 2014-08-10 MED ORDER — ALUM & MAG HYDROXIDE-SIMETH 200-200-20 MG/5ML PO SUSP: 30.0000 mL | ORAL | Status: DC | PRN

## 2014-08-10 MED ORDER — FENTANYL CITRATE (PF) 250 MCG/5ML IJ SOLN
INTRAMUSCULAR | Status: AC
  Filled 2014-08-10: qty 5

## 2014-08-10 MED ORDER — ONDANSETRON HCL 4 MG/2ML IJ SOLN
INTRAMUSCULAR | Status: AC
  Filled 2014-08-10: qty 2

## 2014-08-10 MED ORDER — RIVAROXABAN 10 MG PO TABS
10.0000 mg | ORAL_TABLET | ORAL | Status: DC
  Administered 2014-08-11: 10 mg via ORAL
  Filled 2014-08-10 (×2): qty 1

## 2014-08-10 MED ORDER — LOSARTAN POTASSIUM 50 MG PO TABS
100.0000 mg | ORAL_TABLET | Freq: Every day | ORAL | Status: DC
  Administered 2014-08-10 – 2014-08-11 (×2): 100 mg via ORAL
  Filled 2014-08-10 (×2): qty 2

## 2014-08-10 MED ORDER — ONDANSETRON HCL 4 MG/2ML IJ SOLN
INTRAMUSCULAR | Status: DC | PRN
  Administered 2014-08-10: 4 mg via INTRAVENOUS

## 2014-08-10 MED ORDER — BUDESONIDE 0.25 MG/2ML IN SUSP
0.2500 mg | Freq: Two times a day (BID) | RESPIRATORY_TRACT | Status: DC
  Administered 2014-08-10 – 2014-08-11 (×2): 0.25 mg via RESPIRATORY_TRACT
  Filled 2014-08-10 (×2): qty 2

## 2014-08-10 MED ORDER — MIDAZOLAM HCL 2 MG/2ML IJ SOLN
INTRAMUSCULAR | Status: AC
  Filled 2014-08-10: qty 2

## 2014-08-10 MED ORDER — SODIUM CHLORIDE 0.9 % IV SOLN
100.0000 mL/h | INTRAVENOUS | Status: DC
  Administered 2014-08-10: 100 mL/h via INTRAVENOUS
  Administered 2014-08-11: 20 mL/h via INTRAVENOUS
  Filled 2014-08-10 (×9): qty 1000

## 2014-08-10 MED ORDER — PROPOFOL 10 MG/ML IV BOLUS
INTRAVENOUS | Status: AC
  Filled 2014-08-10: qty 20

## 2014-08-10 MED ORDER — SODIUM CHLORIDE 0.9 % IR SOLN
Status: DC | PRN
  Administered 2014-08-10: 1000 mL

## 2014-08-10 MED ORDER — METRONIDAZOLE 1 % EX GEL: 1.0000 "application " | Freq: Every day | CUTANEOUS | Status: DC

## 2014-08-10 MED ORDER — MAGNESIUM CITRATE PO SOLN: 1.0000 | Freq: Once | ORAL | Status: AC | PRN

## 2014-08-10 MED ORDER — ONDANSETRON HCL 4 MG PO TABS: 4.0000 mg | ORAL_TABLET | Freq: Four times a day (QID) | ORAL | Status: DC | PRN

## 2014-08-10 MED ORDER — PROPOFOL INFUSION 10 MG/ML OPTIME
INTRAVENOUS | Status: DC | PRN
  Administered 2014-08-10: 100 ug/kg/min via INTRAVENOUS

## 2014-08-10 MED ORDER — PAROXETINE HCL ER 12.5 MG PO TB24
12.5000 mg | ORAL_TABLET | Freq: Two times a day (BID) | ORAL | Status: DC
  Administered 2014-08-10 – 2014-08-11 (×2): 12.5 mg via ORAL
  Filled 2014-08-10 (×4): qty 1

## 2014-08-10 MED ORDER — ONDANSETRON HCL 4 MG/2ML IJ SOLN: 4.0000 mg | Freq: Four times a day (QID) | INTRAMUSCULAR | Status: DC | PRN

## 2014-08-10 MED ORDER — CELECOXIB 200 MG PO CAPS
200.0000 mg | ORAL_CAPSULE | Freq: Two times a day (BID) | ORAL | Status: DC
  Administered 2014-08-10: 200 mg via ORAL
  Filled 2014-08-10 (×3): qty 1

## 2014-08-10 MED ORDER — LACTATED RINGERS IV SOLN
INTRAVENOUS | Status: DC
  Administered 2014-08-10: 1000 mL via INTRAVENOUS

## 2014-08-10 MED ORDER — SODIUM CHLORIDE 0.9 % IN AERS: 2.0000 | INHALATION_SPRAY | RESPIRATORY_TRACT | Status: DC | PRN

## 2014-08-10 MED ORDER — CLONAZEPAM 0.5 MG PO TABS: 0.1250 mg | ORAL_TABLET | Freq: Every day | ORAL | Status: DC | PRN

## 2014-08-10 MED ORDER — CLONAZEPAM 0.125 MG PO TBDP: 0.1250 mg | ORAL_TABLET | Freq: Every day | ORAL | Status: DC | PRN

## 2014-08-10 MED ORDER — LOSARTAN POTASSIUM-HCTZ 100-25 MG PO TABS: 1.0000 | ORAL_TABLET | Freq: Every day | ORAL | Status: DC

## 2014-08-10 MED ORDER — POLYETHYLENE GLYCOL 3350 17 G PO PACK
17.0000 g | PACK | Freq: Two times a day (BID) | ORAL | Status: DC
  Administered 2014-08-10: 17 g via ORAL

## 2014-08-10 MED ORDER — SALINE SPRAY 0.65 % NA SOLN: 1.0000 | NASAL | Status: DC | PRN

## 2014-08-10 MED ORDER — PHENOL 1.4 % MT LIQD: 1.0000 | OROMUCOSAL | Status: DC | PRN

## 2014-08-10 MED ORDER — ROCURONIUM BROMIDE 100 MG/10ML IV SOLN
INTRAVENOUS | Status: AC
  Filled 2014-08-10: qty 1

## 2014-08-10 MED ORDER — HYDROCHLOROTHIAZIDE 25 MG PO TABS
25.0000 mg | ORAL_TABLET | Freq: Every day | ORAL | Status: DC
  Administered 2014-08-10 – 2014-08-11 (×2): 25 mg via ORAL
  Filled 2014-08-10 (×2): qty 1

## 2014-08-10 MED ORDER — MINOXIDIL 2 % EX SOLN: 1.0000 "application " | Freq: Two times a day (BID) | CUTANEOUS | Status: DC

## 2014-08-10 MED ORDER — DEXAMETHASONE SODIUM PHOSPHATE 10 MG/ML IJ SOLN
10.0000 mg | Freq: Once | INTRAMUSCULAR | Status: AC
  Administered 2014-08-10: 10 mg via INTRAVENOUS

## 2014-08-10 MED ORDER — DEXAMETHASONE SODIUM PHOSPHATE 10 MG/ML IJ SOLN: 10.0000 mg | Freq: Once | INTRAMUSCULAR | Status: DC

## 2014-08-10 MED ORDER — LIDOCAINE HCL (CARDIAC) 20 MG/ML IV SOLN
INTRAVENOUS | Status: AC
  Filled 2014-08-10: qty 5

## 2014-08-10 MED ORDER — DEXTROSE 5 % IV SOLN
10.0000 mg | INTRAVENOUS | Status: DC | PRN
  Administered 2014-08-10: 30 ug/min via INTRAVENOUS

## 2014-08-10 MED ORDER — FLUTICASONE PROPIONATE 50 MCG/ACT NA SUSP
2.0000 | Freq: Every day | NASAL | Status: DC
  Administered 2014-08-11: 2 via NASAL
  Filled 2014-08-10: qty 16

## 2014-08-10 MED ORDER — DIPHENHYDRAMINE HCL 25 MG PO CAPS: 25.0000 mg | ORAL_CAPSULE | Freq: Four times a day (QID) | ORAL | Status: DC | PRN

## 2014-08-10 MED ORDER — DEXAMETHASONE SODIUM PHOSPHATE 10 MG/ML IJ SOLN
INTRAMUSCULAR | Status: AC
  Filled 2014-08-10: qty 1

## 2014-08-10 MED ORDER — METHOCARBAMOL 500 MG PO TABS
500.0000 mg | ORAL_TABLET | Freq: Four times a day (QID) | ORAL | Status: DC | PRN
  Administered 2014-08-11 (×2): 500 mg via ORAL
  Filled 2014-08-10 (×2): qty 1

## 2014-08-10 MED ORDER — DOCUSATE SODIUM 100 MG PO CAPS
100.0000 mg | ORAL_CAPSULE | Freq: Two times a day (BID) | ORAL | Status: DC
  Administered 2014-08-10: 100 mg via ORAL

## 2014-08-10 MED ORDER — BISACODYL 10 MG RE SUPP: 10.0000 mg | Freq: Every day | RECTAL | Status: DC | PRN

## 2014-08-10 MED ORDER — GUAIFENESIN ER 1200 MG PO TB12: 1.0000 | ORAL_TABLET | Freq: Every day | ORAL | Status: DC

## 2014-08-10 MED ORDER — LEVOTHYROXINE SODIUM 75 MCG PO TABS
75.0000 ug | ORAL_TABLET | Freq: Every day | ORAL | Status: DC
  Administered 2014-08-11: 75 ug via ORAL
  Filled 2014-08-10 (×2): qty 1

## 2014-08-10 MED ORDER — FENTANYL CITRATE (PF) 100 MCG/2ML IJ SOLN: 25.0000 ug | INTRAMUSCULAR | Status: DC | PRN

## 2014-08-10 MED ORDER — OMEPRAZOLE-SODIUM BICARBONATE 40-1100 MG PO CAPS: 1.0000 | ORAL_CAPSULE | Freq: Every day | ORAL | Status: DC

## 2014-08-10 MED ORDER — SODIUM CHLORIDE 0.9 % IV SOLN
1000.0000 mg | Freq: Once | INTRAVENOUS | Status: AC
  Administered 2014-08-10: 1000 mg via INTRAVENOUS
  Filled 2014-08-10: qty 10

## 2014-08-10 MED ORDER — CHLORHEXIDINE GLUCONATE 4 % EX LIQD: 60.0000 mL | Freq: Once | CUTANEOUS | Status: DC

## 2014-08-10 MED ORDER — CEFAZOLIN SODIUM-DEXTROSE 2-3 GM-% IV SOLR
2.0000 g | INTRAVENOUS | Status: AC
  Administered 2014-08-10: 2 g via INTRAVENOUS

## 2014-08-10 MED ORDER — HYDROMORPHONE HCL 1 MG/ML IJ SOLN: 0.2500 mg | INTRAMUSCULAR | Status: DC | PRN

## 2014-08-10 MED ORDER — PAROXETINE HCL ER 12.5 MG PO TB24
12.5000 mg | ORAL_TABLET | Freq: Two times a day (BID) | ORAL | Status: DC
  Filled 2014-08-10: qty 1

## 2014-08-10 MED ORDER — MIDAZOLAM HCL 5 MG/5ML IJ SOLN
INTRAMUSCULAR | Status: DC | PRN
  Administered 2014-08-10: 2 mg via INTRAVENOUS

## 2014-08-10 MED ORDER — METOCLOPRAMIDE HCL 5 MG/ML IJ SOLN: 5.0000 mg | Freq: Three times a day (TID) | INTRAMUSCULAR | Status: DC | PRN

## 2014-08-10 MED ORDER — FENTANYL CITRATE (PF) 100 MCG/2ML IJ SOLN
INTRAMUSCULAR | Status: AC
  Filled 2014-08-10: qty 2

## 2014-08-10 MED ORDER — HYDROCODONE-ACETAMINOPHEN 7.5-325 MG PO TABS
1.0000 | ORAL_TABLET | ORAL | Status: DC
  Administered 2014-08-10: 2 via ORAL
  Administered 2014-08-10 (×3): 1 via ORAL
  Administered 2014-08-11: 2 via ORAL
  Administered 2014-08-11: 1 via ORAL
  Filled 2014-08-10 (×3): qty 1
  Filled 2014-08-10 (×2): qty 2
  Filled 2014-08-10: qty 1

## 2014-08-10 MED ORDER — BUPROPION HCL ER (XL) 150 MG PO TB24
150.0000 mg | ORAL_TABLET | Freq: Every day | ORAL | Status: DC
  Administered 2014-08-10 – 2014-08-11 (×2): 150 mg via ORAL
  Filled 2014-08-10 (×2): qty 1

## 2014-08-10 MED ORDER — FERROUS SULFATE 325 (65 FE) MG PO TABS
325.0000 mg | ORAL_TABLET | Freq: Three times a day (TID) | ORAL | Status: DC
  Filled 2014-08-10 (×5): qty 1

## 2014-08-10 MED ORDER — FENTANYL CITRATE (PF) 100 MCG/2ML IJ SOLN
INTRAMUSCULAR | Status: DC | PRN
  Administered 2014-08-10: 100 ug via INTRAVENOUS

## 2014-08-10 MED ORDER — ALBUTEROL SULFATE HFA 108 (90 BASE) MCG/ACT IN AERS: 2.0000 | INHALATION_SPRAY | Freq: Four times a day (QID) | RESPIRATORY_TRACT | Status: DC | PRN

## 2014-08-10 MED ORDER — GUAIFENESIN ER 600 MG PO TB12
1200.0000 mg | ORAL_TABLET | Freq: Every day | ORAL | Status: DC
  Administered 2014-08-10: 1200 mg via ORAL
  Filled 2014-08-10 (×2): qty 2

## 2014-08-10 MED ORDER — CEFAZOLIN SODIUM-DEXTROSE 2-3 GM-% IV SOLR
INTRAVENOUS | Status: AC
  Filled 2014-08-10: qty 50

## 2014-08-10 MED ORDER — METOCLOPRAMIDE HCL 5 MG PO TABS
5.0000 mg | ORAL_TABLET | Freq: Three times a day (TID) | ORAL | Status: DC | PRN
  Filled 2014-08-10: qty 2

## 2014-08-10 MED ORDER — CEFAZOLIN SODIUM-DEXTROSE 2-3 GM-% IV SOLR
2.0000 g | Freq: Four times a day (QID) | INTRAVENOUS | Status: AC
  Administered 2014-08-10 (×2): 2 g via INTRAVENOUS
  Filled 2014-08-10 (×2): qty 50

## 2014-08-10 MED ORDER — BUPIVACAINE IN DEXTROSE 0.75-8.25 % IT SOLN
INTRATHECAL | Status: DC | PRN
  Administered 2014-08-10: 2 mL via INTRATHECAL

## 2014-08-10 MED ORDER — METHOCARBAMOL 1000 MG/10ML IJ SOLN
500.0000 mg | Freq: Four times a day (QID) | INTRAVENOUS | Status: DC | PRN
  Administered 2014-08-10: 500 mg via INTRAVENOUS
  Filled 2014-08-10 (×2): qty 5

## 2014-08-10 MED ORDER — LORATADINE 10 MG PO TABS
10.0000 mg | ORAL_TABLET | Freq: Every day | ORAL | Status: DC
  Administered 2014-08-10: 10 mg via ORAL
  Filled 2014-08-10 (×2): qty 1

## 2014-08-10 MED ORDER — LACTATED RINGERS IV SOLN
INTRAVENOUS | Status: DC | PRN
  Administered 2014-08-10 (×2): via INTRAVENOUS

## 2014-08-10 MED ORDER — ALBUTEROL SULFATE (2.5 MG/3ML) 0.083% IN NEBU: 2.5000 mg | INHALATION_SOLUTION | Freq: Four times a day (QID) | RESPIRATORY_TRACT | Status: DC | PRN

## 2014-08-10 MED ORDER — MENTHOL 3 MG MT LOZG: 1.0000 | LOZENGE | OROMUCOSAL | Status: DC | PRN

## 2014-08-10 MED ORDER — NON FORMULARY: 40.0000 mg | Freq: Every day | Status: DC

## 2014-08-10 SURGICAL SUPPLY — 42 items
BAG DECANTER FOR FLEXI CONT (MISCELLANEOUS) IMPLANT
BAG ZIPLOCK 12X15 (MISCELLANEOUS) ×3 IMPLANT
CAPT HIP TOTAL 2 ×3 IMPLANT
COVER PERINEAL POST (MISCELLANEOUS) ×3 IMPLANT
DRAPE C-ARM 42X120 X-RAY (DRAPES) ×3 IMPLANT
DRAPE STERI IOBAN 125X83 (DRAPES) ×3 IMPLANT
DRAPE U-SHAPE 47X51 STRL (DRAPES) ×9 IMPLANT
DRSG AQUACEL AG ADV 3.5X10 (GAUZE/BANDAGES/DRESSINGS) ×3 IMPLANT
DURAPREP 26ML APPLICATOR (WOUND CARE) ×3 IMPLANT
ELECT BLADE TIP CTD 4 INCH (ELECTRODE) ×3 IMPLANT
ELECT PENCIL ROCKER SW 15FT (MISCELLANEOUS) ×3 IMPLANT
ELECT REM PT RETURN 15FT ADLT (MISCELLANEOUS) ×3 IMPLANT
ELECT REM PT RETURN 9FT ADLT (ELECTROSURGICAL) ×3
ELECTRODE REM PT RTRN 9FT ADLT (ELECTROSURGICAL) ×1 IMPLANT
FACESHIELD WRAPAROUND (MASK) ×12 IMPLANT
GLOVE BIOGEL PI IND STRL 7.5 (GLOVE) ×1 IMPLANT
GLOVE BIOGEL PI IND STRL 8.5 (GLOVE) ×1 IMPLANT
GLOVE BIOGEL PI INDICATOR 7.5 (GLOVE) ×2
GLOVE BIOGEL PI INDICATOR 8.5 (GLOVE) ×2
GLOVE ECLIPSE 8.0 STRL XLNG CF (GLOVE) ×6 IMPLANT
GLOVE ORTHO TXT STRL SZ7.5 (GLOVE) ×3 IMPLANT
GOWN SPEC L3 XXLG W/TWL (GOWN DISPOSABLE) ×3 IMPLANT
GOWN STRL REUS W/TWL LRG LVL3 (GOWN DISPOSABLE) ×3 IMPLANT
HOLDER FOLEY CATH W/STRAP (MISCELLANEOUS) ×3 IMPLANT
KIT BASIN OR (CUSTOM PROCEDURE TRAY) ×3 IMPLANT
LIQUID BAND (GAUZE/BANDAGES/DRESSINGS) ×3 IMPLANT
NDL SAFETY ECLIPSE 18X1.5 (NEEDLE) IMPLANT
NEEDLE HYPO 18GX1.5 SHARP (NEEDLE)
PACK TOTAL JOINT (CUSTOM PROCEDURE TRAY) ×3 IMPLANT
PEN SKIN MARKING BROAD (MISCELLANEOUS) ×3 IMPLANT
SAW OSC TIP CART 19.5X105X1.3 (SAW) ×3 IMPLANT
SUT MNCRL AB 4-0 PS2 18 (SUTURE) ×3 IMPLANT
SUT VIC AB 1 CT1 36 (SUTURE) ×9 IMPLANT
SUT VIC AB 2-0 CT1 27 (SUTURE) ×4
SUT VIC AB 2-0 CT1 TAPERPNT 27 (SUTURE) ×2 IMPLANT
SUT VLOC 180 0 24IN GS25 (SUTURE) ×3 IMPLANT
SYR 50ML LL SCALE MARK (SYRINGE) IMPLANT
TOWEL OR 17X26 10 PK STRL BLUE (TOWEL DISPOSABLE) ×3 IMPLANT
TOWEL OR NON WOVEN STRL DISP B (DISPOSABLE) IMPLANT
TRAY FOLEY W/METER SILVER 14FR (SET/KITS/TRAYS/PACK) ×3 IMPLANT
WATER STERILE IRR 1500ML POUR (IV SOLUTION) ×3 IMPLANT
YANKAUER SUCT BULB TIP 10FT TU (MISCELLANEOUS) ×3 IMPLANT

## 2014-08-10 NOTE — Interval H&P Note (Signed)
History and Physical Interval Note:  08/10/2014 8:48 AM  [name]  has presented today for surgery, with the diagnosis of right hip osteoarthritis  The various methods of treatment have been discussed with the patient and family. After consideration of risks, benefits and other options for treatment, the patient has consented to  Procedure(s): RIGHT TOTAL HIP ARTHROPLASTY ANTERIOR APPROACH (Right) as a surgical intervention .  The patient's history has been reviewed, patient examined, no change in status, stable for surgery.  I have reviewed the patient's chart and labs.  Questions were answered to the patient's satisfaction.     Mauri Pole

## 2014-08-10 NOTE — Addendum Note (Signed)
Addended by: Huel Cote on: 08/10/2014 10:15 AM   Modules accepted: Orders

## 2014-08-10 NOTE — Evaluation (Signed)
Physical Therapy Evaluation Patient Details Name: [name] MRN: [id number] DOB: 11-05-1945 Today's Date: 08/10/2014   History of Present Illness  RDATHA  Clinical Impression  Patient tolerated well, felt a little woozy so did not walk. Patient will benefit from PT to address problems listed in note below.   Follow Up Recommendations Home health PT;Supervision/Assistance - 24 hour    Equipment Recommendations  None recommended by PT    Recommendations for Other Services       Precautions / Restrictions Precautions Precautions: Fall Restrictions Weight Bearing Restrictions: No      Mobility  Bed Mobility Overal bed mobility: Needs Assistance Bed Mobility: Supine to Sit;Sit to Supine     Supine to sit: Mod assist;HOB elevated Sit to supine: Mod assist   General bed mobility comments: assist  for R leg off of bed and onto bed.  Transfers Overall transfer level: Needs assistance Equipment used: Rolling walker (2 wheeled) Transfers: Sit to/from Stand Sit to Stand: Mod assist;+2 physical assistance;+2 safety/equipment;From elevated surface         General transfer comment: cues for hand placement  Ambulation/Gait Ambulation/Gait assistance: Mod assist;+2 physical assistance;+2 safety/equipment     Gait Pattern/deviations: Step-to pattern     General Gait Details: 5 sidesteps along the bed.  Stairs            Wheelchair Mobility    Modified Rankin (Stroke Patients Only)       Balance                                             Pertinent Vitals/Pain Pain Assessment: 0-10 Pain Score: 5  Pain Location: R thigh Pain Descriptors / Indicators: Tightness Pain Intervention(s): Monitored during session;Ice applied    Home Living Family/patient expects to be discharged to:: Private residence Living Arrangements: Spouse/significant other;Children Available Help at Discharge: Family Type of Home: House Home Access: Stairs to  enter Entrance Stairs-Rails: Psychiatric nurse of Steps: 2 Home Layout: One level Home Equipment: Environmental consultant - 2 wheels;Bedside commode      Prior Function Level of Independence: Independent               Hand Dominance        Extremity/Trunk Assessment               Lower Extremity Assessment: RLE deficits/detail RLE Deficits / Details: unable to take a step forward, able to bear weight fior side stepping    Cervical / Trunk Assessment: Normal  Communication   Communication: No difficulties  Cognition Arousal/Alertness: Awake/alert Behavior During Therapy: WFL for tasks assessed/performed Overall Cognitive Status: Within Functional Limits for tasks assessed                      General Comments      Exercises        Assessment/Plan    PT Assessment Patient needs continued PT services  PT Diagnosis Difficulty walking;Acute pain   PT Problem List Decreased strength;Decreased range of motion;Decreased activity tolerance;Decreased mobility;Decreased knowledge of precautions;Decreased safety awareness;Decreased knowledge of use of DME;Pain  PT Treatment Interventions DME instruction;Gait training;Stair training;Functional mobility training;Therapeutic activities;Therapeutic exercise;Patient/family education   PT Goals (Current goals can be found in the Care Plan section) Acute Rehab PT Goals Patient Stated Goal: to go home PT Goal Formulation: With patient/family Time For Goal Achievement: 08/13/14 Potential to  Achieve Goals: Good    Frequency 7X/week   Barriers to discharge        Co-evaluation               End of Session   Activity Tolerance: Patient tolerated treatment well Patient left: in bed;with call bell/phone within reach;with family/visitor present Nurse Communication: Mobility status         Time: 1451-1516 PT Time Calculation (min) (ACUTE ONLY): 25 min   Charges:   PT Evaluation $Initial PT  Evaluation Tier I: 1 Procedure PT Treatments $Gait Training: 8-22 mins   PT G Codes:        Claretha Cooper 08/10/2014, 4:35 PM Tresa Endo PT [phone]

## 2014-08-10 NOTE — Anesthesia Preprocedure Evaluation (Addendum)
Anesthesia Evaluation  Patient identified by MRN, date of birth, ID band Patient awake    Reviewed: Allergy & Precautions, NPO status , Patient's Chart, lab work & pertinent test results  Airway Mallampati: II  TM Distance: >3 FB Neck ROM: Full    Dental no notable dental hx.    Pulmonary shortness of breath, asthma , pneumonia -, resolved, former smoker,  breath sounds clear to auscultation  Pulmonary exam normal       Cardiovascular Exercise Tolerance: Good hypertension, Pt. on medications Normal cardiovascular examRhythm:Regular Rate:Normal     Neuro/Psych PSYCHIATRIC DISORDERS Anxiety Depression negative neurological ROS     GI/Hepatic Neg liver ROS, hiatal hernia, PUD, GERD-  ,  Endo/Other  diabetes, Type 2Hypothyroidism Patient denies diabetes.  Renal/GU negative Renal ROS  negative genitourinary   Musculoskeletal  (+) Arthritis -, Rheumatoid disorders,    Abdominal   Peds negative pediatric ROS (+)  Hematology negative hematology ROS (+)   Anesthesia Other Findings   Reproductive/Obstetrics negative OB ROS                           Anesthesia Physical Anesthesia Plan  ASA: II  Anesthesia Plan: Spinal   Post-op Pain Management:    Induction: Intravenous  Airway Management Planned:   Additional Equipment:   Intra-op Plan:   Post-operative Plan: Extubation in OR  Informed Consent: I have reviewed the patients History and Physical, chart, labs and discussed the procedure including the risks, benefits and alternatives for the proposed anesthesia with the patient or authorized representative who has indicated his/her understanding and acceptance.   Dental advisory given  Plan Discussed with: CRNA  Anesthesia Plan Comments: (Discussed risks and benefits of and differences between spinal and general. Discussed risks of spinal including headache, backache, failure, bleeding,  infection, and nerve damage. Patient consents to spinal. Questions answered. Coagulation studies and platelet count acceptable.)       Anesthesia Quick Evaluation

## 2014-08-10 NOTE — Anesthesia Postprocedure Evaluation (Addendum)
  Anesthesia Post-op Note  Patient: [name]  Procedure(s) Performed: Procedure(s) (LRB): RIGHT TOTAL HIP ARTHROPLASTY ANTERIOR APPROACH (Right)  Patient Location: PACU  Anesthesia Type: Spinal  Level of Consciousness: awake and alert   Airway and Oxygen Therapy: Patient Spontanous Breathing  Post-op Pain: mild  Post-op Assessment: Post-op Vital signs reviewed, Patient's Cardiovascular Status Stable, Respiratory Function Stable, Patent Airway and No signs of Nausea or vomiting. Spinal regression noted in PACU.  Last Vitals:  Filed Vitals:   08/10/14 1455  BP: 153/69  Pulse: 78  Temp: 36.4 C  Resp: 14    Post-op Vital Signs: stable   Complications: No apparent anesthesia complications

## 2014-08-10 NOTE — Op Note (Signed)
NAME:  [name]                ACCOUNT NO.: [id number]      MEDICAL RECORD NO.: [id number]      FACILITY:  [facility]      PHYSICIAN:  Paralee Cancel D  DATE OF BIRTH:  07-05-1945     DATE OF PROCEDURE:  08/10/2014                                 OPERATIVE REPORT         PREOPERATIVE DIAGNOSIS: Right  hip osteoarthritis.      POSTOPERATIVE DIAGNOSIS:  Right hip osteoarthritis.      PROCEDURE:  Right total hip replacement through an anterior approach   utilizing DePuy THR system, component size 69mm pinnacle cup, a size 36+4 neutral   Altrex liner, a size 3 Hi Tri Lock stem with a 36+1.5 delta ceramic   ball.      SURGEON:  Pietro Cassis. Alvan Dame, M.D.      ASSISTANT:  Danae Orleans, PA-C      ANESTHESIA:  Spinal.      SPECIMENS:  None.      COMPLICATIONS:  None.      BLOOD LOSS:  350 cc     DRAINS:  None.      INDICATION OF THE PROCEDURE:  Cheyenne Bordeaux is a 69 y.o. female who had   presented to office for evaluation of right hip pain.  [name], [name], progressive problems and dysfunction   with regarding the hip prior to surgery.  Consent was obtained for   benefit of pain relief.  Specific risk of infection, DVT, component   failure, dislocation, need for revision surgery, as well discussion of   the anterior versus posterior approach were reviewed.  Consent was   obtained for benefit of anterior pain relief through an anterior   approach.      PROCEDURE IN DETAIL:  The patient was brought to operative theater.   Once adequate anesthesia,  preoperative antibiotics, 2gm of Ancef, 1gm of Tranexamic Acid, and 10mg  Decadron administered.   The patient was positioned supine on the OSI Hanna table.  Once adequate   padding of boney process was carried out, we had predraped out the hip, and  used fluoroscopy to confirm orientation of the pelvis and position.      The right hip was then prepped and draped from proximal iliac crest to   mid thigh with shower curtain technique.      Time-out was performed identifying the patient, planned procedure, and   extremity.     An incision was then made 2 cm distal and lateral to the   anterior superior iliac spine extending over the orientation of the   tensor fascia lata muscle and sharp dissection was carried down to the   fascia of the muscle and protractor placed in the soft tissues.      The fascia was then incised.  The  muscle belly was identified and swept   laterally and retractor placed along the superior neck.  Following   cauterization of the circumflex vessels and removing some pericapsular   fat, a second cobra retractor was placed on the inferior neck.  A third   retractor was placed on the anterior acetabulum after elevating the   anterior rectus.  A L-capsulotomy was along the line of the   superior neck to the trochanteric fossa, then extended proximally and   distally.  Tag sutures were placed and the retractors were then placed   intracapsular.  We then identified the trochanteric fossa and   orientation of my neck cut, confirmed this radiographically   and then made a neck osteotomy with the femur on traction.  The femoral   head was removed without difficulty or complication.  Traction was let   off and retractors were placed posterior and anterior around the   acetabulum.      The labrum and foveal tissue were debrided.  I began reaming with a 59mm   reamer and reamed up to 78mm reamer with good bony bed preparation and a 30mm   cup was chosen.  The final 76mm  Pinnacle cup was then impacted under fluoroscopy  to confirm the depth of penetration and orientation with respect to   abduction.  A screw was placed followed by the hole eliminator.  The final   36+4 neutral Altrex liner was impacted with good visualized rim fit.  The cup was positioned anatomically within the acetabular portion of the pelvis.      At this point, the femur was rolled at 80 degrees.  Further capsule was   released off the inferior aspect of the femoral neck.  I then   released the superior capsule proximally.  The hook was placed laterally   along the femur and elevated manually and held in position with the bed   hook.  The leg was then extended and adducted with the leg rolled to 100   degrees of external rotation.  Once the proximal femur was fully   exposed, I used a box osteotome to set orientation.  I then began   broaching with the starting chili pepper broach and passed this by hand and then broached up to 3.  With the 3 broach in place I chose a high offset neck and did a trial reduction.  The offset was appropriate, leg lengths   appeared to be equal, confirmed radiographically.   Given these findings, I went ahead and dislocated the hip, repositioned all   retractors and positioned the right hip in the extended and abducted position.  The final 3 Hi Tri Lock stem was   chosen and it was impacted down to the level of neck cut.  Based on this   and the trial reduction, a 36+1.5 delta ceramic ball was chosen and   impacted onto a clean and dry trunnion, and the hip was reduced.  The   hip had been irrigated throughout the case again at this point.  I did   reapproximate the superior capsular leaflet to the anterior leaflet   using #1 Vicryl.  The fascia of the   tensor fascia lata muscle was then reapproximated using #1 Vicryl and #0 V-lock sutures.  The   remaining wound was closed with 2-0 Vicryl and running 4-0 Monocryl.   The hip was cleaned, dried, and dressed  sterilely using Dermabond and   Aquacel dressing.  She  was then brought   to recovery room in stable condition tolerating the procedure well.    Danae Orleans, PA-C was present for the entirety of the case involved from   preoperative positioning, perioperative retractor management, general   facilitation of the case, as well as primary wound closure as assistant.            Pietro Cassis Alvan Dame, M.D.        08/10/2014 11:52 AM

## 2014-08-10 NOTE — Transfer of Care (Signed)
Immediate Anesthesia Transfer of Care Note  Patient: [name]  Procedure(s) Performed: Procedure(s): RIGHT TOTAL HIP ARTHROPLASTY ANTERIOR APPROACH (Right)  Patient Location: PACU  Anesthesia Type:SpinalT12  Level of Consciousness:  sedated, patient cooperative and responds to stimulation  Airway & Oxygen Therapy:Patient Spontanous Breathing and Patient connected to face mask oxgen  Post-op Assessment:  Report given to PACU RN and Post -op Vital signs reviewed and stable  Post vital signs:  Reviewed and stable  Last Vitals:  Filed Vitals:   08/10/14 0703  BP: 122/82  Pulse: 80  Temp: 36.8 C  Resp: 18    Complications: No apparent anesthesia complications

## 2014-08-10 NOTE — Anesthesia Procedure Notes (Addendum)
Spinal Patient location during procedure: OR End time: 08/10/2014 10:37 AM Staffing Resident/CRNA: Enrigue Catena E Performed by: anesthesiologist  Preanesthetic Checklist Completed: patient identified, site marked, surgical consent, pre-op evaluation, timeout performed, IV checked, risks and benefits discussed and monitors and equipment checked Spinal Block Patient position: sitting Prep: Betadine Patient monitoring: heart rate, continuous pulse ox and blood pressure Location: L2-3 Injection technique: single-shot Needle Needle type: Spinocan  Needle gauge: 22 G Needle length: 9 cm Assessment Sensory level: T4 Additional Notes Expiration date of kit checked and confirmed. Patient tolerated procedure well, without complications.Negative heme or paresthesia. CSF x 3.

## 2014-08-11 ENCOUNTER — Encounter (HOSPITAL_COMMUNITY): Payer: Self-pay | Admitting: Orthopedic Surgery

## 2014-08-11 LAB — BASIC METABOLIC PANEL
Anion gap: 7 (ref 5–15)
BUN: 15 mg/dL (ref 6–20)
CALCIUM: 8.7 mg/dL — AB (ref 8.9–10.3)
CO2: 27 mmol/L (ref 22–32)
Chloride: 105 mmol/L (ref 101–111)
Creatinine, Ser: 0.76 mg/dL (ref 0.44–1.00)
GFR calc non Af Amer: >60 mL/min (ref >60)
Glucose, Bld: 134 mg/dL — ABNORMAL HIGH (ref 65–99)
Potassium: 4.1 mmol/L (ref 3.5–5.1)
Sodium: 139 mmol/L (ref 135–145)

## 2014-08-11 LAB — CBC
HCT: 32.5 % — ABNORMAL LOW (ref 36.0–46.0)
HEMOGLOBIN: 10.7 g/dL — AB (ref 12.0–15.0)
MCH: 28.8 pg (ref 26.0–34.0)
MCHC: 32.9 g/dL (ref 30.0–36.0)
MCV: 87.4 fL (ref 78.0–100.0)
PLATELETS: 297 10*3/uL (ref 150–400)
RBC: 3.72 MIL/uL — AB (ref 3.87–5.11)
RDW: 13.9 % (ref 11.5–15.5)
WBC: 12.4 10*3/uL — ABNORMAL HIGH (ref 4.0–10.5)

## 2014-08-11 MED ORDER — RIVAROXABAN 10 MG PO TABS: 10.0000 mg | ORAL_TABLET | ORAL | Status: DC

## 2014-08-11 MED ORDER — HYDROCODONE-ACETAMINOPHEN 7.5-325 MG PO TABS: 1.0000 | ORAL_TABLET | ORAL | Status: DC | PRN

## 2014-08-11 MED ORDER — TIZANIDINE HCL 4 MG PO TABS: 4.0000 mg | ORAL_TABLET | Freq: Four times a day (QID) | ORAL | Status: DC | PRN

## 2014-08-11 MED ORDER — FERROUS SULFATE 325 (65 FE) MG PO TABS: 325.0000 mg | ORAL_TABLET | Freq: Three times a day (TID) | ORAL | Status: DC

## 2014-08-11 MED ORDER — DOCUSATE SODIUM 100 MG PO CAPS: 100.0000 mg | ORAL_CAPSULE | Freq: Two times a day (BID) | ORAL | Status: DC

## 2014-08-11 MED ORDER — POLYETHYLENE GLYCOL 3350 17 G PO PACK: 17.0000 g | PACK | Freq: Two times a day (BID) | ORAL | Status: DC

## 2014-08-11 NOTE — Care Management Note (Signed)
Case Management Note  Patient Details  Name: [name] MRN: [id number] Date of Birth: 1945/12/08  Subjective/Objective:                   RIGHT TOTAL HIP ARTHROPLASTY ANTERIOR APPROACH (Right) Action/Plan:  Discharge planning Expected Discharge Date:  08/11/14               Expected Discharge Plan:  [place]  In-House Referral:     Discharge planning Services  CM Consult  Post Acute Care Choice:  Home Health Choice offered to:  Patient  DME Arranged:  3-N-1, Walker rolling DME Agency:  [place]:  PT [place] Agency:  [place]  Status of Service:  Completed, signed off  Medicare Important Message Given:    Date Medicare IM Given:    Medicare IM give by:    Date Additional Medicare IM Given:    Additional Medicare Important Message give by:     If discussed at [place] of Stay Meetings, dates discussed:    Additional Comments: CM met with pt in room to offer choice of home health agency.  Pt chooses [facility] to render HHPT.  Address and contact information verified by pt.  Referral given to [facility] rep, Tim (on unit).  CM called [facility] DME rep, Lecretia to please deliver the rolling walker and 3n1 to room prior to discharge today.  No other CM needs were communicated. Dellie Catholic, RN 08/11/2014, 12:43 PM

## 2014-08-11 NOTE — Plan of Care (Signed)
Problem: Consults Goal: Diagnosis- Total Joint Replacement Outcome: Completed/Met Date Met:  08/11/14 Primary Total Hip RIGHT, Anterior

## 2014-08-11 NOTE — Progress Notes (Signed)
Physical Therapy Treatment Patient Details Name: [name] MRN: [id number] DOB: 1945-03-25 Today's Date: 08/11/2014    History of Present Illness RDATHA    PT Comments    Patient is doing well today, ambulated x 150'. Plans Dc after PT and stair training.  Follow Up Recommendations  Home health PT;Supervision/Assistance - 24 hour     Equipment Recommendations  None recommended by PT    Recommendations for Other Services       Precautions / Restrictions Precautions Precautions: Fall Restrictions Weight Bearing Restrictions: No    Mobility  Bed Mobility                  Transfers   Equipment used: Rolling walker (2 wheeled) Transfers: Sit to/from Stand Sit to Stand: Min assist         General transfer comment: cues for hand placement  Ambulation/Gait Ambulation/Gait assistance: Min assist Ambulation Distance (Feet): 150 Feet Assistive device: Rolling walker (2 wheeled) Gait Pattern/deviations: Step-to pattern;Step-through pattern Gait velocity: slow   General Gait Details: initially unable to advance the R leg, cues to advance the  L leg fiirst, cues for posture, able to advance by end of walk.   Stairs            Wheelchair Mobility    Modified Rankin (Stroke Patients Only)       Balance                                    Cognition Arousal/Alertness: Awake/alert                          Exercises Total Joint Exercises Ankle Circles/Pumps: AROM;Both;10 reps;Supine Quad Sets: AROM;Both;10 reps;Supine Short Arc Quad: AROM;Right;10 reps;Supine Heel Slides: AAROM;Right;10 reps;Supine Hip ABduction/ADduction: AAROM;Right;10 reps;Supine Long Arc Quad: Right;10 reps;Supine    General Comments        Pertinent Vitals/Pain Pain Score: 3  Pain Location: R thigh Pain Descriptors / Indicators: Sore Pain Intervention(s): Monitored during session;Premedicated before session;Ice applied    Home Living  Family/patient expects to be discharged to:: Private residence Living Arrangements: Spouse/significant other;Children Available Help at Discharge: Family Type of Home: House Home Access: Stairs to enter Entrance Stairs-Rails: Right;Left Home Layout: One level Home Equipment: Environmental consultant - 2 wheels;Bedside commode      Prior Function Level of Independence: Independent          PT Goals (current goals can now be found in the care plan section) Progress towards PT goals: Progressing toward goals    Frequency  7X/week    PT Plan Current plan remains appropriate    Co-evaluation             End of Session   Activity Tolerance: Patient tolerated treatment well Patient left: in chair;with call bell/phone within reach;with family/visitor present     Time: 4098-1191 PT Time Calculation (min) (ACUTE ONLY): 41 min  Charges:  $Gait Training: 8-22 mins $Therapeutic Exercise: 8-22 mins $Self Care/Home Management: 8-22                    G Codes:      Claretha Cooper 08/11/2014, 11:42 AM

## 2014-08-11 NOTE — Progress Notes (Signed)
     Subjective: 1 Day Post-Op Procedure(s) (LRB): RIGHT TOTAL HIP ARTHROPLASTY ANTERIOR APPROACH (Right)   Patient reports pain as mild, pain controlled. No events throughout the night. Ready to be discharged home if she does well with PT.   Objective:   VITALS:   Filed Vitals:   08/11/14 0515  BP: 112/61  Pulse: 71  Temp: 98.2 F (36.8 C)  Resp: 16    Dorsiflexion/Plantar flexion intact Incision: dressing C/D/I No cellulitis present Compartment soft  LABS  Recent Labs  08/11/14 0425  HGB 10.7*  HCT 32.5*  WBC 12.4*  PLT 297     Recent Labs  08/11/14 0425  NA 139  K 4.1  BUN 15  CREATININE 0.76  GLUCOSE 134*     Assessment/Plan: 1 Day Post-Op Procedure(s) (LRB): RIGHT TOTAL HIP ARTHROPLASTY ANTERIOR APPROACH (Right) Foley cath d/c'ed Advance diet Up with therapy D/C IV fluids Discharge home with home health  Follow up [facility] 2 weeks at [facility]. Follow up with OLIN,Sesar Madewell D in 2 weeks.  Contact information:  [facility] [address] [place] [phone]    Obese (BMI 30-39.9) Estimated body mass index is 31.54 kg/(m^2) as calculated from the following:   Height as of this encounter: 5\' 3"  (1.6 m).   Weight as of this encounter: 80.74 kg (178 lb). Patient also counseled that weight may inhibit the healing process Patient counseled that losing weight will help with future health issues        [place]. Tiyonna Sardinha   PAC  08/11/2014, 9:02 AM

## 2014-08-11 NOTE — Plan of Care (Signed)
Problem: Phase III Progression Outcomes Goal: Anticoagulant follow-up in place Outcome: Not Applicable Date Met:  08/11/14 Xarelto VTE, no f/u needed.     

## 2014-08-11 NOTE — Progress Notes (Signed)
Physical Therapy Treatment Patient Details Name: [name] MRN: [id number] DOB: 02-Apr-1945 Today's Date: 08/11/2014    History of Present Illness RDATHA    PT Comments    Patient  Reports incREASED PAIN r HIP AND THIGH. IMPROVED WITH MOBILITY. HAS PRACTICED STEPS.  Follow Up Recommendations  Home health PT;Supervision/Assistance - 24 hour     Equipment Recommendations  None recommended by PT    Recommendations for Other Services       Precautions / Restrictions Precautions Precautions: Fall    Mobility  Bed Mobility                  Transfers Overall transfer level: Needs assistance Equipment used: Rolling walker (2 wheeled) Transfers: Sit to/from Stand Sit to Stand: Min assist         General transfer comment: cues for hand placement  Ambulation/Gait Ambulation/Gait assistance: Min assist Ambulation Distance (Feet): 10 Feet Assistive device: Rolling walker (2 wheeled) Gait Pattern/deviations: Step-to pattern;Shuffle;Antalgic Gait velocity: slow   General Gait Details: initially unable to advance the R leg, cues to advance the  L leg fiirst, cues for posture, able to advance by end of walk.   Stairs Stairs: Yes Stairs assistance: Min assist Stair Management: Two rails;Step to pattern;Forwards Number of Stairs: 2 General stair comments: daughter present for instruction  Wheelchair Mobility    Modified Rankin (Stroke Patients Only)       Balance                                    Cognition Arousal/Alertness: Awake/alert Behavior During Therapy: WFL for tasks assessed/performed Overall Cognitive Status: Within Functional Limits for tasks assessed                      Exercises Total Joint Exercises Ankle Circles/Pumps: AROM;Both;10 reps;Supine Quad Sets: AROM;Both;10 reps;Supine Short Arc Quad: AROM;Right;10 reps;Supine Heel Slides: AAROM;Right;10 reps;Supine Hip ABduction/ADduction: AAROM;Right;10  reps;Supine Long Arc Quad: Right;10 reps;Supine    General Comments        Pertinent Vitals/Pain Pain Score: 9  Pain Location: R thigh Pain Descriptors / Indicators: Aching;Burning;Grimacing Pain Intervention(s): Monitored during session;Premedicated before session    Home Living Family/patient expects to be discharged to:: Private residence Living Arrangements: Spouse/significant other;Children Available Help at Discharge: Family Type of Home: House Home Access: Stairs to enter Entrance Stairs-Rails: Right;Left Home Layout: One level Home Equipment: Environmental consultant - 2 wheels;Bedside commode      Prior Function Level of Independence: Independent          PT Goals (current goals can now be found in the care plan section) Acute Rehab PT Goals Patient Stated Goal: to go home Progress towards PT goals: Progressing toward goals    Frequency  7X/week    PT Plan Current plan remains appropriate    Co-evaluation             End of Session   Activity Tolerance: Patient tolerated treatment well Patient left: in chair;with call bell/phone within reach;with family/visitor present     Time: 8891-6945 PT Time Calculation (min) (ACUTE ONLY): 34 min  Charges:  $Gait Training: 23-37 mins $Therapeutic Exercise: 8-22 mins $Self Care/Home Management: 10/15/2022                    G Codes:      Claretha Cooper 08/11/2014, 3:31 PM

## 2014-08-11 NOTE — Evaluation (Signed)
  Occupational Therapy Evaluation Patient Details Name: [name] MRN: [id number] DOB: [birth year] Today's Date: 08-13-14    History of Present Illness RDATHA   Clinical Impression   OT education complete regarding ADL acticity s/p THA (anterior)             Precautions / Restrictions Precautions Precautions: Fall Restrictions Weight Bearing Restrictions: No      Mobility Bed Mobility                  Transfers   Equipment used: Rolling walker (2 wheeled) Transfers: Sit to/from Stand Sit to Stand: Min assist         General transfer comment: cues for hand placement         ADL Overall ADL's : Needs assistance/impaired Eating/Feeding: Set up;Sitting   Grooming: Set up;Sitting   Upper Body Bathing: Set up;Sitting   Lower Body Bathing: Moderate assistance;With adaptive equipment;Sit to/from stand   Upper Body Dressing : Set up;Sitting   Lower Body Dressing: Moderate assistance;Sit to/from stand   Toilet Transfer: Min guard;Comfort height toilet;Ambulation;RW   Toileting- Clothing Manipulation and Hygiene: Sit to/from stand;Min guard         General ADL Comments: VC for safety, hand placment and ADL activiity               Pertinent Vitals/Pain Pain Score: 4  Pain Location: r thigh Pain Descriptors / Indicators: Sore Pain Intervention(s): Repositioned;Monitored during session     Hand Dominance     Extremity/Trunk Assessment Upper Extremity Assessment Upper Extremity Assessment: Overall WFL for tasks assessed           Communication Communication Communication: No difficulties   Cognition Arousal/Alertness: Awake/alert Behavior During Therapy: WFL for tasks assessed/performed Overall Cognitive Status: Within Functional Limits for tasks assessed                                Home Living Family/patient expects to be discharged to:: Private residence Living Arrangements: Spouse/significant  other;Children Available Help at Discharge: Family Type of Home: House Home Access: Stairs to enter Technical brewer of Steps: 2 Entrance Stairs-Rails: Right;Left Home Layout: One level     Bathroom Shower/Tub: Tub/shower unit         Home Equipment: Environmental consultant - 2 wheels;Bedside commode          Prior Functioning/Environment Level of Independence: Independent             OT Diagnosis:     OT Problem List:     OT Treatment/Interventions:      OT Goals(Current goals can be found in the care plan section) Acute Rehab OT Goals Patient Stated Goal: to go home OT Goal Formulation: With patient Time For Goal Achievement: 2014/08/13  OT Frequency:     Barriers to D/C:            Co-evaluation              End of Session    Activity Tolerance:   Patient left:  in bathroom with daughter present   Time: 3299-2426 OT Time Calculation (min): 58 min Charges:  OT General Charges $OT Visit: 1 Procedure OT Evaluation $Initial OT Evaluation Tier I: 1 Procedure OT Treatments $Self Care/Home Management : 23-37 mins G-Codes:    Payton Mccallum D 2014-08-13, 11:44 AM

## 2014-08-11 NOTE — Progress Notes (Signed)
Utilization review completed.  

## 2014-08-11 NOTE — Discharge Instructions (Addendum)
INSTRUCTIONS AFTER JOINT REPLACEMENT  ° °o Remove items at home which could result in a fall. This includes throw rugs or furniture in walking pathways °o ICE to the affected joint every three hours while awake for 30 minutes at a time, for at least the first 3-5 days, and then as needed for pain and swelling.  Continue to use ice for pain and swelling. You may notice swelling that will progress down to the foot and ankle.  This is normal after surgery.  Elevate your leg when you are not up walking on it.   °o Continue to use the breathing machine you got in the hospital (incentive spirometer) which will help keep your temperature down.  It is common for your temperature to cycle up and down following surgery, especially at night when you are not up moving around and exerting yourself.  The breathing machine keeps your lungs expanded and your temperature down. ° ° °DIET:  As you were doing prior to hospitalization, we recommend a well-balanced diet. ° °DRESSING / WOUND CARE / SHOWERING ° °Keep the surgical dressing until follow up.  The dressing is water proof, so you can shower without any extra covering.  IF THE DRESSING FALLS OFF or the wound gets wet inside, change the dressing with sterile gauze.  Please use good hand washing techniques before changing the dressing.  Do not use any lotions or creams on the incision until instructed by your surgeon.   ° °ACTIVITY ° °o Increase activity slowly as tolerated, but follow the weight bearing instructions below.   °o No driving for 6 weeks or until further direction given by your physician.  You cannot drive while taking narcotics.  °o No lifting or carrying greater than 10 lbs. until further directed by your surgeon. °o Avoid periods of inactivity such as sitting longer than an hour when not asleep. This helps prevent blood clots.  °o You may return to work once you are authorized by your doctor.  ° ° ° °WEIGHT BEARING  ° °Weight bearing as tolerated with assist  device (walker, cane, etc) as directed, use it as long as suggested by your surgeon or therapist, typically at least 4-6 weeks. ° ° °EXERCISES ° °Results after joint replacement surgery are often greatly improved when you follow the exercise, range of motion and muscle strengthening exercises prescribed by your doctor. Safety measures are also important to protect the joint from further injury. Any time any of these exercises cause you to have increased pain or swelling, decrease what you are doing until you are comfortable again and then slowly increase them. If you have problems or questions, call your caregiver or physical therapist for advice.  ° °Rehabilitation is important following a joint replacement. After just a few days of immobilization, the muscles of the leg can become weakened and shrink (atrophy).  These exercises are designed to build up the tone and strength of the thigh and leg muscles and to improve motion. Often times heat used for twenty to thirty minutes before working out will loosen up your tissues and help with improving the range of motion but do not use heat for the first two weeks following surgery (sometimes heat can increase post-operative swelling).  ° °These exercises can be done on a training (exercise) mat, on the floor, on a table or on a bed. Use whatever works the best and is most comfortable for you.    Use music or television while you are exercising so that   the exercises are a pleasant break in your day. This will make your life better with the exercises acting as a break in your routine that you can look forward to.   Perform all exercises about fifteen times, three times per day or as directed.  You should exercise both the operative leg and the other leg as well. ° °Exercises include: °  °• Quad Sets - Tighten up the muscle on the front of the thigh (Quad) and hold for 5-10 seconds.   °• Straight Leg Raises - With your knee straight (if you were given a brace, keep it on),  lift the leg to 60 degrees, hold for 3 seconds, and slowly lower the leg.  Perform this exercise against resistance later as your leg gets stronger.  °• Leg Slides: Lying on your back, slowly slide your foot toward your buttocks, bending your knee up off the floor (only go as far as is comfortable). Then slowly slide your foot back down until your leg is flat on the floor again.  °• Angel Wings: Lying on your back spread your legs to the side as far apart as you can without causing discomfort.  °• Hamstring Strength:  Lying on your back, push your heel against the floor with your leg straight by tightening up the muscles of your buttocks.  Repeat, but this time bend your knee to a comfortable angle, and push your heel against the floor.  You may put a pillow under the heel to make it more comfortable if necessary.  ° °A rehabilitation program following joint replacement surgery can speed recovery and prevent re-injury in the future due to weakened muscles. Contact your doctor or a physical therapist for more information on knee rehabilitation.  ° ° °CONSTIPATION ° °Constipation is defined medically as fewer than three stools per week and severe constipation as less than one stool per week.  Even if you have a regular bowel pattern at home, your normal regimen is likely to be disrupted due to multiple reasons following surgery.  Combination of anesthesia, postoperative narcotics, change in appetite and fluid intake all can affect your bowels.  ° °YOU MUST use at least one of the following options; they are listed in order of increasing strength to get the job done.  They are all available over the counter, and you may need to use some, POSSIBLY even all of these options:   ° °Drink plenty of fluids (prune juice may be helpful) and high fiber foods °Colace 100 mg by mouth twice a day  °Senokot for constipation as directed and as needed Dulcolax (bisacodyl), take with full glass of water  °Miralax (polyethylene glycol)  once or twice a day as needed. ° °If you have tried all these things and are unable to have a bowel movement in the first 3-4 days after surgery call either your surgeon or your primary doctor.   ° °If you experience loose stools or diarrhea, hold the medications until you stool forms back up.  If your symptoms do not get better within 1 week or if they get worse, check with your doctor.  If you experience "the worst abdominal pain ever" or develop nausea or vomiting, please contact the office immediately for further recommendations for treatment. ° ° °ITCHING:  If you experience itching with your medications, try taking only a single pain pill, or even half a pain pill at a time.  You can also use Benadryl over the counter for itching or also to   help with sleep.  ° °TED HOSE STOCKINGS:  Use stockings on both legs until for at least 2 weeks or as directed by physician office. They may be removed at night for sleeping. ° °MEDICATIONS:  See your medication summary on the “After Visit Summary” that nursing will review with you.  You may have some home medications which will be placed on hold until you complete the course of blood thinner medication.  It is important for you to complete the blood thinner medication as prescribed. ° °PRECAUTIONS:  If you experience chest pain or shortness of breath - call 911 immediately for transfer to the hospital emergency department.  ° °If you develop a fever greater that 101 F, purulent drainage from wound, increased redness or drainage from wound, foul odor from the wound/dressing, or calf pain - CONTACT YOUR SURGEON.   °                                                °FOLLOW-UP APPOINTMENTS:  If you do not already have a post-op appointment, please call the office for an appointment to be seen by your surgeon.  Guidelines for how soon to be seen are listed in your “After Visit Summary”, but are typically between 1-4 weeks after surgery. ° °OTHER INSTRUCTIONS:  ° °Knee  Replacement:  Do not place pillow under knee, focus on keeping the knee straight while resting.  ° °MAKE SURE YOU:  °• Understand these instructions.  °• Get help right away if you are not doing well or get worse.  ° ° °Thank you for letting us be a part of your medical care team.  It is a privilege we respect greatly.  We hope these instructions will help you stay on track for a fast and full recovery!  °  ° °Information on my medicine - XARELTO® (Rivaroxaban) ° °This medication education was reviewed with me or my healthcare representative as part of my discharge preparation.  The pharmacist that spoke with me during my hospital stay was:  Deaja Rizo E, RPH ° °Why was Xarelto® prescribed for you? °Xarelto® was prescribed for you to reduce the risk of blood clots forming after orthopedic surgery. The medical term for these abnormal blood clots is venous thromboembolism (VTE). ° °What do you need to know about xarelto® ? °Take your Xarelto® ONCE DAILY at the same time every day. °You may take it either with or without food. ° °If you have difficulty swallowing the tablet whole, you may crush it and mix in applesauce just prior to taking your dose. ° °Take Xarelto® exactly as prescribed by your doctor and DO NOT stop taking Xarelto® without talking to the doctor who prescribed the medication.  Stopping without other VTE prevention medication to take the place of Xarelto® may increase your risk of developing a clot. ° °After discharge, you should have regular check-up appointments with your healthcare provider that is prescribing your Xarelto®.   ° °What do you do if you miss a dose? °If you miss a dose, take it as soon as you remember on the same day then continue your regularly scheduled once daily regimen the next day. Do not take two doses of Xarelto® on the same day.  ° °Important Safety Information °A possible side effect of Xarelto® is bleeding. You should call your healthcare provider right away if   you  experience any of the following: °? Bleeding from an injury or your nose that does not stop. °? Unusual colored urine (red or dark brown) or unusual colored stools (red or black). °? Unusual bruising for unknown reasons. °? A serious fall or if you hit your head (even if there is no bleeding). ° °Some medicines may interact with Xarelto® and might increase your risk of bleeding while on Xarelto®. To help avoid this, consult your healthcare provider or pharmacist prior to using any new prescription or non-prescription medications, including herbals, vitamins, non-steroidal anti-inflammatory drugs (NSAIDs) and supplements. ° °This website has more information on Xarelto®: www.xarelto.com. ° ° ° °

## 2014-08-12 DIAGNOSIS — M069 Rheumatoid arthritis, unspecified: Secondary | ICD-10-CM | POA: Diagnosis not present

## 2014-08-12 DIAGNOSIS — F329 Major depressive disorder, single episode, unspecified: Secondary | ICD-10-CM | POA: Diagnosis not present

## 2014-08-12 DIAGNOSIS — Z96641 Presence of right artificial hip joint: Secondary | ICD-10-CM | POA: Diagnosis not present

## 2014-08-12 DIAGNOSIS — Z471 Aftercare following joint replacement surgery: Secondary | ICD-10-CM | POA: Diagnosis not present

## 2014-08-12 DIAGNOSIS — F42 Obsessive-compulsive disorder: Secondary | ICD-10-CM | POA: Diagnosis not present

## 2014-08-12 DIAGNOSIS — I10 Essential (primary) hypertension: Secondary | ICD-10-CM | POA: Diagnosis not present

## 2014-08-16 DIAGNOSIS — M069 Rheumatoid arthritis, unspecified: Secondary | ICD-10-CM | POA: Diagnosis not present

## 2014-08-16 DIAGNOSIS — Z471 Aftercare following joint replacement surgery: Secondary | ICD-10-CM | POA: Diagnosis not present

## 2014-08-16 DIAGNOSIS — I10 Essential (primary) hypertension: Secondary | ICD-10-CM | POA: Diagnosis not present

## 2014-08-16 DIAGNOSIS — Z96641 Presence of right artificial hip joint: Secondary | ICD-10-CM | POA: Diagnosis not present

## 2014-08-16 DIAGNOSIS — F42 Obsessive-compulsive disorder: Secondary | ICD-10-CM | POA: Diagnosis not present

## 2014-08-16 DIAGNOSIS — F329 Major depressive disorder, single episode, unspecified: Secondary | ICD-10-CM | POA: Diagnosis not present

## 2014-08-17 ENCOUNTER — Ambulatory Visit (HOSPITAL_COMMUNITY): Payer: Medicare Other | Attending: Cardiology

## 2014-08-17 ENCOUNTER — Other Ambulatory Visit (HOSPITAL_COMMUNITY): Payer: Self-pay | Admitting: Orthopedic Surgery

## 2014-08-17 DIAGNOSIS — M79604 Pain in right leg: Secondary | ICD-10-CM | POA: Diagnosis not present

## 2014-08-17 DIAGNOSIS — Z471 Aftercare following joint replacement surgery: Secondary | ICD-10-CM

## 2014-08-17 DIAGNOSIS — Z96641 Presence of right artificial hip joint: Principal | ICD-10-CM

## 2014-08-17 NOTE — Discharge Summary (Signed)
Physician Discharge Summary  Patient ID: [name] MRN: [id number] DOB/AGE: 1945-11-16 69 y.o.  Admit date: 08/10/2014 Discharge date: 08/11/2014   Procedures:  Procedure(s) (LRB): RIGHT TOTAL HIP ARTHROPLASTY ANTERIOR APPROACH (Right)  Attending Physician:  Dr. Paralee Cancel   Admission Diagnoses:   Right hip osteoarthritis / pain  Discharge Diagnoses:  Principal Problem:   S/P right THA, AA  Past Medical History  Diagnosis Date  . Other and unspecified hyperlipidemia   . Unspecified arthropathy, hand   . Peptic ulcer, unspecified site, unspecified as acute or chronic, without mention of hemorrhage, perforation, or obstruction   . Allergic rhinitis, cause unspecified   . Benign paroxysmal positional vertigo   . Depressive disorder, not elsewhere classified   . Esophageal reflux   . Unspecified hypothyroidism   . Localized osteoarthrosis not specified whether primary or secondary, lower leg   . Other diseases of vocal cords   . Hypertension   . Shortness of breath dyspnea     with anxiety; with climbing stairs  . Unspecified asthma(493.90)     triggered with a virus   . Pneumonia     bilat pneumonia 1987  . Anxiety   . History of hiatal hernia   . Cancer     basal cell carcinoma per right side of nostril  . TMJ (temporomandibular joint disorder)   . Diabetes mellitus without complication     noted per H&P with Dr Dianah Field 08/03/2014     HPI:    [name], 69 y.o. female, has a history of pain and functional disability in the right hip(s) due to arthritis and patient has failed non-surgical conservative treatments for greater than 12 weeks to include NSAID's and/or analgesics, corticosteriod injections, use of assistive devices and activity modification. Onset of symptoms was gradual starting 6.5 years ago with gradually worsening course since that time.The patient noted no past surgery on the right hip(s). Patient currently rates pain in the right  hip at 10 out of 10 with activity. Patient has night pain, worsening of pain with activity and weight bearing, trendelenberg gait, pain that interfers with activities of daily living and pain with passive range of motion. Patient has evidence of periarticular osteophytes and joint space narrowing by imaging studies. This condition presents safety issues increasing the risk of falls. There is no current active infection. Risks, benefits and expectations were discussed with the patient. Risks including but not limited to the risk of anesthesia, blood clots, nerve damage, blood vessel damage, failure of the prosthesis, infection and up to and including death. Patient understand the risks, benefits and expectations and wishes to proceed with surgery.   PCP: Beatrice Lecher, MD   Discharged Condition: good  Hospital Course:  Patient underwent the above stated procedure on 08/10/2014. Patient tolerated the procedure well and brought to the recovery room in good condition and subsequently to the floor.  POD #1 BP: 112/61 ; Pulse: 71 ; Temp: 98.2 F (36.8 C) ; Resp: 16 Patient reports pain as mild, pain controlled. No events throughout the night. Ready to be discharged home if she does well with PT.  Dorsiflexion/plantar flexion intact, incision: dressing C/D/I, no cellulitis present and compartment soft.   LABS  Basename    HGB  10.7  HCT  32.5    Discharge Exam: General appearance: alert, cooperative and no distress Extremities: Homans sign is negative, no sign of DVT, no edema, redness or tenderness in the calves or thighs and no ulcers, gangrene or trophic changes  Disposition: Home with follow up in 2 weeks   Follow-up Information    Follow up with Mauri Pole, MD. Schedule an appointment as soon as possible for a visit in 2 weeks.   Specialty:  Orthopedic Surgery   Contact information:   [address] [place] [address] [phone]       Follow up with  [facility].   Why:  home health physical therapy   Contact information:   [address] [place] [address] [phone]       Follow up with [name].   Why:  rolling walker and 3n1 (over the commode seat)   Contact information:   [address] [place] [address] [phone]       Discharge Instructions    Call MD / Call 911    Complete by:  As directed   If you experience chest pain or shortness of breath, CALL 911 and be transported to the hospital emergency room.  If you develope a fever above 101 F, pus (white drainage) or increased drainage or redness at the wound, or calf pain, call your surgeon's office.     Change dressing    Complete by:  As directed   Maintain surgical dressing until follow up in the clinic. If the edges start to pull up, may reinforce with tape. If the dressing is no longer working, may remove and cover with gauze and tape, but must keep the area dry and clean.  Call with any questions or concerns.     Constipation Prevention    Complete by:  As directed   Drink plenty of fluids.  Prune juice may be helpful.  You may use a stool softener, such as Colace (over the counter) 100 mg twice a day.  Use MiraLax (over the counter) for constipation as needed.     Diet - low sodium heart healthy    Complete by:  As directed      Discharge instructions    Complete by:  As directed   Maintain surgical dressing until follow up in the clinic. If the edges start to pull up, may reinforce with tape. If the dressing is no longer working, may remove and cover with gauze and tape, but must keep the area dry and clean.  Follow up in 2 weeks at [facility]. Call with any questions or concerns.     Increase activity slowly as tolerated    Complete by:  As directed      TED hose    Complete by:  As directed   Use stockings (TED hose) for 2 weeks on both leg(s).  You may remove them at night for sleeping.      Weight bearing as tolerated    Complete by:  As directed   Laterality:  right  Extremity:  Lower             Medication List    STOP taking these medications        acetaminophen 500 MG tablet  Commonly known as:  TYLENOL     HYDROcodone-acetaminophen 5-325 MG per tablet  Commonly known as:  NORCO/VICODIN  Replaced by:  HYDROcodone-acetaminophen 7.5-325 MG per tablet     meloxicam 15 MG tablet  Commonly known as:  MOBIC      TAKE these medications        albuterol 108 (90 BASE) MCG/ACT inhaler  Commonly known as:  PROVENTIL HFA  Inhale 2 puffs into the lungs every 6 (six) hours as needed.     BIOTIN 5000 5 MG Caps  Generic drug:  Biotin  Take 1 capsule by mouth daily.     buPROPion 150 MG 24 hr tablet  Commonly known as:  WELLBUTRIN XL  Take 150 mg by mouth daily.     Calcium Carbonate-Vit D-Min 600-400 MG-UNIT Tabs  Take 1 tablet by mouth 2 (two) times daily.     clindamycin 1 % lotion  Commonly known as:  CLEOCIN T  Apply 1 application topically 2 (two) times daily as needed (bumps).     clonazePAM 0.5 MG tablet  Commonly known as:  KLONOPIN  Take 0.125-0.25 mg by mouth daily as needed for anxiety.     docusate sodium 100 MG capsule  Commonly known as:  COLACE  Take 1 capsule (100 mg total) by mouth 2 (two) times daily.     ferrous sulfate 325 (65 FE) MG tablet  Take 1 tablet (325 mg total) by mouth 3 (three) times daily after meals.     fexofenadine 180 MG tablet  Commonly known as:  ALLEGRA  Take 180 mg by mouth at bedtime.     fluticasone 50 MCG/ACT nasal spray  Commonly known as:  FLONASE  USE 2 SPRAYS IN EACH NOSTRIL DAILY     folic acid 1 MG tablet  Commonly known as:  FOLVITE  Take 1 mg by mouth 2 (two) times daily.     Ginkgo Biloba 40 MG Tabs  Take 1 tablet by mouth 3 (three) times daily.     HYDROcodone-acetaminophen 7.5-325 MG per tablet  Commonly known as:  NORCO  Take 1-2 tablets by mouth every 4 (four) hours as needed for  moderate pain.     levothyroxine 75 MCG tablet  Commonly known as:  SYNTHROID, LEVOTHROID  Take 75 mcg by mouth daily. Except Sunday 88 mcg     levothyroxine 88 MCG tablet  Commonly known as:  SYNTHROID, LEVOTHROID  Take only sunday     losartan-hydrochlorothiazide 100-25 MG per tablet  Commonly known as:  HYZAAR  Take 1 tablet by mouth daily.     Lysine 500 MG Tabs  Take 1 tablet by mouth daily.     metroNIDAZOLE 1 % gel  Commonly known as:  METROGEL  APPLY EVERY DAY     minoxidil 2 % external solution  Commonly known as:  ROGAINE  Apply 1 application topically 2 (two) times daily.     MUCINEX MAXIMUM STRENGTH 1200 MG Tb12  Generic drug:  Guaifenesin  Take 1 tablet by mouth 2 (two) times daily as needed (allergies).     multivitamin with minerals Tabs tablet  Take 1 tablet by mouth daily.     PARoxetine 12.5 MG 24 hr tablet  Commonly known as:  PAXIL-CR  Take 12.5 mg by mouth 2 (two) times daily.     polyethylene glycol packet  Commonly known as:  MIRALAX / GLYCOLAX  Take 17 g by mouth 2 (two) times daily.     PULMICORT FLEXHALER 180 MCG/ACT inhaler  Generic drug:  budesonide  INHALE 2 PUFFS INTO THE LUNGS 2 (TWO) TIMES DAILY.     rivaroxaban 10 MG Tabs tablet  Commonly known as:  XARELTO  Take 1 tablet (10 mg total) by mouth daily.     SIMPLY SALINE 0.9 % Aers  Generic drug:  Saline  Place 1 spray into the nose 2 (two) times daily as needed (dry).  sodium chloride inhaler solution  Commonly known as:  BRONCHO SALINE  Take 2 sprays by nebulization as needed (allergies). Saline wash twice daily     tiZANidine 4 MG tablet  Commonly known as:  ZANAFLEX  Take 1 tablet (4 mg total) by mouth every 6 (six) hours as needed for muscle spasms.     vitamin C with rose hips 500 MG tablet  Take 500 mg by mouth every other day.     Vitamin D3 2000 UNITS Tabs  Take 2,000 Units by mouth daily.     ZEGERID 40-1100 MG per capsule  Generic drug:  omeprazole-sodium  bicarbonate  Take 1 capsule by mouth daily before breakfast.         Signed: West Pugh. Frederic Tones   PA-C  08/17/2014, 12:01 PM

## 2014-08-19 DIAGNOSIS — Z96641 Presence of right artificial hip joint: Secondary | ICD-10-CM | POA: Diagnosis not present

## 2014-08-19 DIAGNOSIS — I10 Essential (primary) hypertension: Secondary | ICD-10-CM | POA: Diagnosis not present

## 2014-08-19 DIAGNOSIS — F329 Major depressive disorder, single episode, unspecified: Secondary | ICD-10-CM | POA: Diagnosis not present

## 2014-08-19 DIAGNOSIS — F42 Obsessive-compulsive disorder: Secondary | ICD-10-CM | POA: Diagnosis not present

## 2014-08-19 DIAGNOSIS — Z471 Aftercare following joint replacement surgery: Secondary | ICD-10-CM | POA: Diagnosis not present

## 2014-08-19 DIAGNOSIS — M069 Rheumatoid arthritis, unspecified: Secondary | ICD-10-CM | POA: Diagnosis not present

## 2014-08-23 DIAGNOSIS — Z471 Aftercare following joint replacement surgery: Secondary | ICD-10-CM | POA: Diagnosis not present

## 2014-08-23 DIAGNOSIS — I10 Essential (primary) hypertension: Secondary | ICD-10-CM | POA: Diagnosis not present

## 2014-08-23 DIAGNOSIS — Z96641 Presence of right artificial hip joint: Secondary | ICD-10-CM | POA: Diagnosis not present

## 2014-08-23 DIAGNOSIS — M069 Rheumatoid arthritis, unspecified: Secondary | ICD-10-CM | POA: Diagnosis not present

## 2014-08-23 DIAGNOSIS — F42 Obsessive-compulsive disorder: Secondary | ICD-10-CM | POA: Diagnosis not present

## 2014-08-23 DIAGNOSIS — F329 Major depressive disorder, single episode, unspecified: Secondary | ICD-10-CM | POA: Diagnosis not present

## 2014-08-25 DIAGNOSIS — Z471 Aftercare following joint replacement surgery: Secondary | ICD-10-CM | POA: Diagnosis not present

## 2014-08-25 DIAGNOSIS — Z96641 Presence of right artificial hip joint: Secondary | ICD-10-CM | POA: Diagnosis not present

## 2014-08-26 ENCOUNTER — Other Ambulatory Visit: Payer: Self-pay | Admitting: Family Medicine

## 2014-08-26 DIAGNOSIS — M069 Rheumatoid arthritis, unspecified: Secondary | ICD-10-CM | POA: Diagnosis not present

## 2014-08-26 DIAGNOSIS — Z471 Aftercare following joint replacement surgery: Secondary | ICD-10-CM | POA: Diagnosis not present

## 2014-08-26 DIAGNOSIS — Z96641 Presence of right artificial hip joint: Secondary | ICD-10-CM | POA: Diagnosis not present

## 2014-08-26 DIAGNOSIS — I10 Essential (primary) hypertension: Secondary | ICD-10-CM | POA: Diagnosis not present

## 2014-08-26 DIAGNOSIS — F42 Obsessive-compulsive disorder: Secondary | ICD-10-CM | POA: Diagnosis not present

## 2014-08-26 DIAGNOSIS — F329 Major depressive disorder, single episode, unspecified: Secondary | ICD-10-CM | POA: Diagnosis not present

## 2014-08-30 ENCOUNTER — Other Ambulatory Visit: Payer: Self-pay

## 2014-08-30 MED ORDER — MELOXICAM 15 MG PO TABS: ORAL_TABLET | ORAL | Status: DC

## 2014-09-13 DIAGNOSIS — H04123 Dry eye syndrome of bilateral lacrimal glands: Secondary | ICD-10-CM | POA: Diagnosis not present

## 2014-09-13 DIAGNOSIS — H25813 Combined forms of age-related cataract, bilateral: Secondary | ICD-10-CM | POA: Diagnosis not present

## 2014-09-13 DIAGNOSIS — H43813 Vitreous degeneration, bilateral: Secondary | ICD-10-CM | POA: Diagnosis not present

## 2014-09-13 DIAGNOSIS — H1789 Other corneal scars and opacities: Secondary | ICD-10-CM | POA: Diagnosis not present

## 2014-09-14 DIAGNOSIS — F42 Obsessive-compulsive disorder: Secondary | ICD-10-CM | POA: Diagnosis not present

## 2014-09-14 DIAGNOSIS — F902 Attention-deficit hyperactivity disorder, combined type: Secondary | ICD-10-CM | POA: Diagnosis not present

## 2014-09-14 DIAGNOSIS — F410 Panic disorder [episodic paroxysmal anxiety] without agoraphobia: Secondary | ICD-10-CM | POA: Diagnosis not present

## 2014-09-18 ENCOUNTER — Other Ambulatory Visit: Payer: Self-pay | Admitting: Family Medicine

## 2014-09-20 ENCOUNTER — Other Ambulatory Visit: Payer: Self-pay

## 2014-09-20 DIAGNOSIS — Z1231 Encounter for screening mammogram for malignant neoplasm of breast: Secondary | ICD-10-CM

## 2014-09-22 DIAGNOSIS — Z471 Aftercare following joint replacement surgery: Secondary | ICD-10-CM | POA: Diagnosis not present

## 2014-09-22 DIAGNOSIS — Z96641 Presence of right artificial hip joint: Secondary | ICD-10-CM | POA: Diagnosis not present

## 2014-09-28 DIAGNOSIS — E038 Other specified hypothyroidism: Secondary | ICD-10-CM | POA: Diagnosis not present

## 2014-09-28 DIAGNOSIS — E559 Vitamin D deficiency, unspecified: Secondary | ICD-10-CM | POA: Diagnosis not present

## 2014-09-28 DIAGNOSIS — R5382 Chronic fatigue, unspecified: Secondary | ICD-10-CM | POA: Diagnosis not present

## 2014-09-28 DIAGNOSIS — R7301 Impaired fasting glucose: Secondary | ICD-10-CM | POA: Diagnosis not present

## 2014-09-28 DIAGNOSIS — E780 Pure hypercholesterolemia: Secondary | ICD-10-CM | POA: Diagnosis not present

## 2014-10-04 ENCOUNTER — Ambulatory Visit (INDEPENDENT_AMBULATORY_CARE_PROVIDER_SITE_OTHER): Payer: Medicare Other | Admitting: Family Medicine

## 2014-10-04 ENCOUNTER — Encounter: Payer: Self-pay | Admitting: Family Medicine

## 2014-10-04 VITALS — BP 136/78 | HR 85 | Wt 179.0 lb

## 2014-10-04 DIAGNOSIS — I10 Essential (primary) hypertension: Secondary | ICD-10-CM

## 2014-10-04 DIAGNOSIS — M2550 Pain in unspecified joint: Secondary | ICD-10-CM | POA: Diagnosis not present

## 2014-10-04 MED ORDER — MELOXICAM 15 MG PO TABS: ORAL_TABLET | ORAL | Status: DC

## 2014-10-04 NOTE — Progress Notes (Signed)
   Subjective:    Patient ID: [name], female    DOB: Jun 04, 1945, 69 y.o.   MRN: [id number]  HPI post hip replacement 8 wks pt would like 90 day supply of Mobic. She is doing really well.   She is off pain medications.  She completed PT at home as well.  She had a post surgical. UTI.   Hypertension- We added the HCTZ.  Pt denies chest pain, SOB, dizziness, or heart palpitations.  Taking meds as directed w/o problems.  Denies medication side effects.  Home BPs have been well controlled.      Review of Systems     Objective:   Physical Exam  Constitutional: She is oriented to person, place, and time. She appears well-developed and well-nourished.  HENT:  Head: Normocephalic and atraumatic.  Cardiovascular: Normal rate, regular rhythm and normal heart sounds.   Pulmonary/Chest: Effort normal and breath sounds normal.  Musculoskeletal:  Surgical scar on hip is well-healed.  Neurological: She is alert and oriented to person, place, and time.  Skin: Skin is warm and dry.  Psychiatric: She has a normal mood and affect. Her behavior is normal.          Assessment & Plan:  HTN -  Doing well with the addition of hydrochlorothiazide. Due for BMP. She can get the lab at her convenience. Otherwise follow-up in 4 months.  OA - will refill her Mobic. She's doing great with her hip replacement. Okay to start using a scar cream on her incision.

## 2014-10-05 DIAGNOSIS — L659 Nonscarring hair loss, unspecified: Secondary | ICD-10-CM | POA: Diagnosis not present

## 2014-10-05 DIAGNOSIS — E038 Other specified hypothyroidism: Secondary | ICD-10-CM | POA: Diagnosis not present

## 2014-10-05 DIAGNOSIS — E559 Vitamin D deficiency, unspecified: Secondary | ICD-10-CM | POA: Diagnosis not present

## 2014-10-05 DIAGNOSIS — R5382 Chronic fatigue, unspecified: Secondary | ICD-10-CM | POA: Diagnosis not present

## 2014-10-05 DIAGNOSIS — E780 Pure hypercholesterolemia: Secondary | ICD-10-CM | POA: Diagnosis not present

## 2014-10-05 DIAGNOSIS — R7301 Impaired fasting glucose: Secondary | ICD-10-CM | POA: Diagnosis not present

## 2014-10-05 LAB — BASIC METABOLIC PANEL WITH GFR
BUN: 22 mg/dL (ref 7–25)
CHLORIDE: 103 mmol/L (ref 98–110)
CO2: 28 mmol/L (ref 20–31)
Calcium: 9.3 mg/dL (ref 8.6–10.4)
Creat: 0.82 mg/dL (ref 0.60–0.93)
GFR, EST NON AFRICAN AMERICAN: 73 mL/min (ref >=60)
GFR, Est African American: 84 mL/min (ref >=60)
Glucose, Bld: 93 mg/dL (ref 65–99)
POTASSIUM: 4.4 mmol/L (ref 3.5–5.3)
Sodium: 139 mmol/L (ref 135–146)

## 2014-10-05 NOTE — Progress Notes (Signed)
Quick Note:  All labs are normal. ______ 

## 2014-10-20 ENCOUNTER — Encounter: Payer: Self-pay | Admitting: Emergency Medicine

## 2014-10-20 ENCOUNTER — Ambulatory Visit (INDEPENDENT_AMBULATORY_CARE_PROVIDER_SITE_OTHER): Payer: Medicare Other | Admitting: Emergency Medicine

## 2014-10-20 VITALS — BP 116/70 | HR 90 | Ht 66.0 in | Wt 180.8 lb

## 2014-10-20 DIAGNOSIS — M059 Rheumatoid arthritis with rheumatoid factor, unspecified: Secondary | ICD-10-CM | POA: Diagnosis not present

## 2014-10-20 DIAGNOSIS — J309 Allergic rhinitis, unspecified: Secondary | ICD-10-CM | POA: Diagnosis not present

## 2014-10-20 DIAGNOSIS — J4540 Moderate persistent asthma, uncomplicated: Secondary | ICD-10-CM

## 2014-10-20 NOTE — Progress Notes (Signed)
Subjective:    Patient ID: [name], female    DOB: Nov 28, 1945, 69 y.o.   MRN: [id number]  HPI 69 year old woman who has been followed by Dr. Joya Gaskins in our office for allergic rhinitis, mild persistent asthma, some degree of upper airway irritation syndrome based on her office notes. She underwent a right hip replacement in 07/2014 with Dr Jerilynn Mages. Alvan Dame.  She unfortunately just started to get URI sx, exposed through her grandchild. She is having some am cough, bothers her most during the Fall, some hoarse voice. She is on allegra, uses flonase only prn. She is maintained on pulmicort qd. She remains active. No flares. She is on mucinex right now.   She has hx ? RA followed by Dr Ouida Sills, was treated for 2 yrs w MTX, now off of it.     Review of Systems As per HPI  Past Medical History  Diagnosis Date  . Other and unspecified hyperlipidemia   . Unspecified arthropathy, hand   . Peptic ulcer, unspecified site, unspecified as acute or chronic, without mention of hemorrhage, perforation, or obstruction   . Allergic rhinitis, cause unspecified   . Benign paroxysmal positional vertigo   . Depressive disorder, not elsewhere classified   . Esophageal reflux   . Unspecified hypothyroidism   . Localized osteoarthrosis not specified whether primary or secondary, lower leg   . Other diseases of vocal cords   . Hypertension   . Shortness of breath dyspnea     with anxiety; with climbing stairs  . Unspecified asthma(493.90)     triggered with a virus   . Pneumonia     bilat pneumonia 1987  . Anxiety   . History of hiatal hernia   . Cancer     basal cell carcinoma per right side of nostril  . TMJ (temporomandibular joint disorder)   . Diabetes mellitus without complication     noted per H&P with Dr Dianah Field 08/03/2014      Family History  Problem Relation Age of Onset  . Heart disease Father   . Colon cancer Mother   . Liver cancer Brother   . Asthma Brother      Social  History   Social History  . Marital Status: Married    Spouse Name: N/A  . Number of Children: 3  . Years of Education: N/A   Occupational History  . Retired    Social History Main Topics  . Smoking status: Former Smoker -- 0.30 packs/day for 2 years    Types: Cigarettes    Quit date: 02/12/1958  . Smokeless tobacco: Never Used  . Alcohol Use: Yes     Comment: wine socially  . Drug Use: No  . Sexual Activity: Yes   Other Topics Concern  . Not on file   Social History Narrative     Allergies  Allergen Reactions  . Astelin [Azelastine Hcl] Other (See Comments)    Headache  . Lisinopril     ACE cough  . Morphine     REACTION: Nausea  . Moxifloxacin     REACTION: confusion, dizziness, paranoia     Outpatient Prescriptions Prior to Visit  Medication Sig Dispense Refill  . albuterol (PROVENTIL HFA) 108 (90 BASE) MCG/ACT inhaler Inhale 2 puffs into the lungs every 6 (six) hours as needed. (Patient taking differently: Inhale 2 puffs into the lungs every 6 (six) hours as needed for shortness of breath. ) 6.7 g 4  . Ascorbic Acid (VITAMIN C WITH ROSE  HIPS) 500 MG tablet Take 500 mg by mouth every other day.    . Biotin (BIOTIN 5000) 5 MG CAPS Take 1 capsule by mouth daily.      Marland Kitchen buPROPion (WELLBUTRIN XL) 150 MG 24 hr tablet Take 150 mg by mouth daily.      . Calcium Carbonate-Vit D-Min 600-400 MG-UNIT TABS Take 1 tablet by mouth 2 (two) times daily.     . Cholecalciferol (VITAMIN D3) 2000 UNITS TABS Take 2,000 Units by mouth daily.     . clonazePAM (KLONOPIN) 0.5 MG tablet Take 0.125-0.25 mg by mouth daily as needed for anxiety.     . fexofenadine (ALLEGRA) 180 MG tablet Take 180 mg by mouth at bedtime.    . fluticasone (FLONASE) 50 MCG/ACT nasal spray USE 2 SPRAYS IN EACH NOSTRIL DAILY (Patient taking differently: USE 2 SPRAYS IN EACH NOSTRIL once daily as needed congestion) 16 g 6  . folic acid (FOLVITE) 1 MG tablet Take 1 mg by mouth 2 (two) times daily.      . Ginkgo  Biloba 40 MG TABS Take 1 tablet by mouth 3 (three) times daily.     Marland Kitchen levothyroxine (SYNTHROID, LEVOTHROID) 75 MCG tablet Take 75 mcg by mouth daily. Except Sunday 88 mcg    . levothyroxine (SYNTHROID, LEVOTHROID) 88 MCG tablet Take only sunday     . losartan-hydrochlorothiazide (HYZAAR) 100-25 MG per tablet TAKE 1 TABLET BY MOUTH DAILY. 90 tablet 1  . Lysine 500 MG TABS Take 1 tablet by mouth daily.    . meloxicam (MOBIC) 15 MG tablet TAKE 1 TABLET BY MOUTH EVERY MORNING 90 tablet 1  . metroNIDAZOLE (METROGEL) 1 % gel APPLY EVERY DAY (Patient taking differently: Apply 1 application topically daily. ) 60 g 1  . minoxidil (ROGAINE) 2 % external solution Apply 1 application topically 2 (two) times daily.    . Multiple Vitamins-Minerals (CENTRUM SILVER ULTRA WOMENS PO) Take 1 capsule by mouth daily.    Marland Kitchen omeprazole-sodium bicarbonate (ZEGERID) 40-1100 MG per capsule Take 1 capsule by mouth daily before breakfast.      . PARoxetine (PAXIL-CR) 12.5 MG 24 hr tablet Take 12.5 mg by mouth 2 (two) times daily.     Marland Kitchen PULMICORT FLEXHALER 180 MCG/ACT inhaler INHALE 2 PUFFS INTO THE LUNGS 2 (TWO) TIMES DAILY. (Patient taking differently: INHALE 2 PUFFS INTO THE LUNGS DAILY.) 1 each 5  . clindamycin (CLEOCIN T) 1 % lotion Apply 1 application topically 2 (two) times daily as needed (bumps).     . polyethylene glycol (MIRALAX / GLYCOLAX) packet Take 17 g by mouth 2 (two) times daily. (Patient not taking: Reported on 10/20/2014) 14 each 0  . Saline (SIMPLY SALINE) 0.9 % AERS Place 1 spray into the nose 2 (two) times daily as needed (dry).     No facility-administered medications prior to visit.         Objective:   Physical Exam  Filed Vitals:   10/20/14 1511  BP: 116/70  Pulse: 90  Height: 5\' 6"  (1.676 m)  Weight: 180 lb 12.8 oz (82.01 kg)  SpO2: 98%   Gen: Pleasant, well-nourished, in no distress,  normal affect  ENT: No lesions,  mouth clear,  oropharynx clear, no postnasal drip  Neck: No JVD, no  TMG, no carotid bruits  Lungs: No use of accessory muscles, clear without rales or rhonchi  Cardiovascular: RRR, heart sounds normal,  no peripheral edema  Musculoskeletal: No deformities, no cyanosis or clubbing  Neuro: alert, non focal  Skin: Warm, no lesions or rashes      Assessment & Plan:  Allergic rhinitis Currently well managed. She uses fluticasone as needed and Allegra every day. We discussed going on for take his own daily during the fall allergy season she will consider this but she does get the side effects of loss of smell.   Asthma with allergic rhinitis Currently stable on Pulmicort. We will continue his regimen  Rheumatoid arthritis with rheumatoid factor Possible seronegative rheumatoid arthritis. The diagnosis is in some question. She has been on methotrexate in the past but is not currently on this. I do not believe she needs a chest x-ray today but we will follow this intermittently

## 2014-10-20 NOTE — Assessment & Plan Note (Signed)
Currently stable on Pulmicort. We will continue his regimen

## 2014-10-20 NOTE — Assessment & Plan Note (Signed)
Currently well managed. She uses fluticasone as needed and Allegra every day. We discussed going on for take his own daily during the fall allergy season she will consider this but she does get the side effects of loss of smell.

## 2014-10-20 NOTE — Assessment & Plan Note (Signed)
Possible seronegative rheumatoid arthritis. The diagnosis is in some question. She has been on methotrexate in the past but is not currently on this. I do not believe she needs a chest x-ray today but we will follow this intermittently

## 2014-10-20 NOTE — Patient Instructions (Addendum)
Please continue your Pulmicort as you have been taking it Continue Allegra Consider taking your fluticasone nasal spray every day during the fall allergy season, and go back to as needed We will follow your chest x-ray intermittently, none needed today Follow with Dr Lamonte Sakai in 4 months or sooner if you have any problems.

## 2014-10-27 ENCOUNTER — Ambulatory Visit
Admission: RE | Admit: 2014-10-27 | Discharge: 2014-10-27 | Disposition: A | Discharge status: Home / Self Care | Payer: Medicare Other | Source: Ambulatory Visit

## 2014-10-27 DIAGNOSIS — Z1231 Encounter for screening mammogram for malignant neoplasm of breast: Secondary | ICD-10-CM | POA: Diagnosis not present

## 2014-10-28 ENCOUNTER — Ambulatory Visit (INDEPENDENT_AMBULATORY_CARE_PROVIDER_SITE_OTHER): Payer: Medicare Other | Admitting: Family Medicine

## 2014-10-28 ENCOUNTER — Encounter: Payer: Self-pay | Admitting: Family Medicine

## 2014-10-28 VITALS — BP 139/70 | HR 84 | Wt 180.0 lb

## 2014-10-28 DIAGNOSIS — J069 Acute upper respiratory infection, unspecified: Secondary | ICD-10-CM | POA: Diagnosis not present

## 2014-10-28 DIAGNOSIS — D2372 Other benign neoplasm of skin of left lower limb, including hip: Secondary | ICD-10-CM | POA: Diagnosis not present

## 2014-10-28 NOTE — Progress Notes (Signed)
   Subjective:    Patient ID: [name], female    DOB: Aug 16, 1945, 69 y.o.   MRN: [id number]  HPI Bitten by a spider about a year ago and and then later hit it on a corner and it started bleeding. Now feels like a knot.  No bothersome.  No itching.    She also has been battling cold but feels like she is actually getting a lot better. She has had some sinus congestion and cough with it. No fever. She said she had a cold and that her husband got it and she got better and now she's felt like she caught it again. She will just wants me to listen to her lungs today and make sure that they sound clear.   Review of Systems     Objective:   Physical Exam  Constitutional: She is oriented to person, place, and time. She appears well-developed and well-nourished.  HENT:  Head: Normocephalic and atraumatic.  Cardiovascular: Normal rate, regular rhythm and normal heart sounds.   Pulmonary/Chest: Effort normal and breath sounds normal.  Neurological: She is alert and oriented to person, place, and time.  Skin: Skin is warm and dry.  Left lower leg with a small 80mm nodule lesion with a dusky red/brown color.  No scale or scab. No open wound or drianage. Nontender to touch.   Psychiatric: She has a normal mood and affect. Her behavior is normal.          Assessment & Plan:  Dermatofibroma-discussed diagnosis and explained that it's a type of scar tissue from minor trauma. It may aid in color but typically these don't go away. They usually represent permanent scar tissue. Certainly if she feels like it's changing or getting larger than she should come back in for a biopsy.  Upper respiratory infection-lungs sound clear today and gave her reassurance. I'm glad she is already feeling some better. She can surly call the office if she feels that she's getting worse.

## 2014-10-29 ENCOUNTER — Ambulatory Visit: Payer: BLUE CROSS/BLUE SHIELD

## 2014-10-29 ENCOUNTER — Other Ambulatory Visit: Payer: Self-pay | Admitting: Family Medicine

## 2014-12-01 ENCOUNTER — Ambulatory Visit (INDEPENDENT_AMBULATORY_CARE_PROVIDER_SITE_OTHER): Payer: Medicare Other | Admitting: Physician Assistant

## 2014-12-01 ENCOUNTER — Encounter: Payer: Self-pay | Admitting: Physician Assistant

## 2014-12-01 VITALS — BP 146/76 | HR 80 | Ht 66.0 in | Wt 180.0 lb

## 2014-12-01 DIAGNOSIS — J209 Acute bronchitis, unspecified: Secondary | ICD-10-CM | POA: Diagnosis not present

## 2014-12-01 DIAGNOSIS — J0100 Acute maxillary sinusitis, unspecified: Secondary | ICD-10-CM

## 2014-12-01 MED ORDER — PREDNISONE 20 MG PO TABS: ORAL_TABLET | ORAL | Status: DC

## 2014-12-01 MED ORDER — AMOXICILLIN-POT CLAVULANATE 875-125 MG PO TABS: 1.0000 | ORAL_TABLET | Freq: Two times a day (BID) | ORAL | Status: DC

## 2014-12-01 MED ORDER — BENZONATATE 100 MG PO CAPS: 100.0000 mg | ORAL_CAPSULE | Freq: Two times a day (BID) | ORAL | Status: DC | PRN

## 2014-12-01 NOTE — Progress Notes (Signed)
   Subjective:    Patient ID: [name], female    DOB: [birth year], 68 y.o.   MRN: [id number]  HPI Patient is a 69 year old female who presents to the clinic with sinus pressure, cough, bilateral ear pain, nasal congestion. Symptoms have been present for the last week.She denies any fever. She has has chills off and on. She does have a productive cough with yellow sputum. She has a history of sinusitis and bronchitis once or twice a year. She has previously seen a pulmonologist but could not get in with him. She has not tried anything over-the-counter for symptomatic relief.   Review of Systems  All other systems reviewed and are negative.      Objective:   Physical Exam  Constitutional: She is oriented to person, place, and time. She appears well-developed and well-nourished.  HENT:  Head: Normocephalic and atraumatic.  Right Ear: External ear normal.  Left Ear: External ear normal.  Right TM erythematous, buldging without pus.  Left TM clear.   Bilaterally maxillary and frontal sinus tenderness to palpation.   Bilateral nasal turbinates red and swollen.   Oropharynx erythematous with PND, no tonsilar exudate or swelling.   Cardiovascular: Normal rate, regular rhythm and normal heart sounds.   Pulmonary/Chest: Effort normal.  Bilateral wheezing and scattered rhonchi.   Neurological: She is alert and oriented to person, place, and time.  Skin: Skin is dry.  Psychiatric: She has a normal mood and affect. Her behavior is normal.          Assessment & Plan:  Acute sinusitis/acute bronchitis- treated with augmentin for 10 days. Prednisone taper given. Tessalon pearls for cough. Continue with flonase. Symptomatic HO given. Follow up if not improving or worsening.

## 2014-12-02 ENCOUNTER — Telehealth: Payer: Self-pay | Admitting: Emergency Medicine

## 2014-12-02 MED ORDER — BUDESONIDE 180 MCG/ACT IN AEPB: INHALATION_SPRAY | RESPIRATORY_TRACT | Status: DC

## 2014-12-02 NOTE — Telephone Encounter (Signed)
Called and spoke to pt. Pt requesting refill of pulmicort. Rx sent to preferred pharmacy. Pt verbalized understanding and denied any further questions or concerns at this time.

## 2015-01-11 ENCOUNTER — Encounter: Payer: Self-pay | Admitting: Emergency Medicine

## 2015-01-11 ENCOUNTER — Ambulatory Visit (INDEPENDENT_AMBULATORY_CARE_PROVIDER_SITE_OTHER): Payer: Medicare Other | Admitting: Emergency Medicine

## 2015-01-11 VITALS — BP 150/90 | HR 87 | Wt 184.0 lb

## 2015-01-11 DIAGNOSIS — J383 Other diseases of vocal cords: Secondary | ICD-10-CM | POA: Diagnosis not present

## 2015-01-11 DIAGNOSIS — Z23 Encounter for immunization: Secondary | ICD-10-CM | POA: Diagnosis not present

## 2015-01-11 DIAGNOSIS — J4540 Moderate persistent asthma, uncomplicated: Secondary | ICD-10-CM

## 2015-01-11 DIAGNOSIS — J309 Allergic rhinitis, unspecified: Secondary | ICD-10-CM

## 2015-01-11 NOTE — Assessment & Plan Note (Signed)
Certainly being influenced by her marginally controlled allergies. Need to treat allergies more aggressively as below.

## 2015-01-11 NOTE — Assessment & Plan Note (Signed)
  Continue your allegra daily.  Restart fluticasone nasal spray, 2 sprays each nostril daily.  Continue mucinex as needed.  Follow with Dr Lamonte Sakai in 6 months or sooner if you have any problems

## 2015-01-11 NOTE — Progress Notes (Signed)
Subjective:    Patient ID: [name], female    DOB: [birth year], 69 y.o.   MRN: [id number]  Asthma Her past medical history is significant for asthma.   69 year old woman who has been followed by Dr. Joya Gaskins in our office for allergic rhinitis, mild persistent asthma, some degree of upper airway irritation syndrome based on her office notes. She underwent a right hip replacement in 07/2014 with Dr Jerilynn Mages. Alvan Dame.  She unfortunately just started to get URI sx, exposed through her grandchild. She is having some am cough, bothers her most during the Fall, some hoarse voice. She is on allegra, uses flonase only prn. She is maintained on pulmicort qd. She remains active. No flares. She is on mucinex right now.   She has hx ? RA followed by Dr Ouida Sills, was treated for 2 yrs w MTX, now off of it.   ROV 01/11/15 -- follow-up visit for allergic rhinitis cough, upper airway irritation, mild persistent asthma. Also carries a diagnosis of seronegative rheumatoid arthritis. She has been on methotrexate in the past but not currently.  She has been using Pulmicort. She has had cyclical episodes of cough, some rhinitis, UA noise. Also w HA's. Can be bad for a week, then better. Has used mucinex with some improvement. She is on allegra, does not use her fluticasone because it makes her lose her smell.  She would like to avoid if possible. She was treated with augmentin and pred taper by her PCP in October.     Review of Systems As per HPI  Past Medical History  Diagnosis Date  . Other and unspecified hyperlipidemia   . Unspecified arthropathy, hand   . Peptic ulcer, unspecified site, unspecified as acute or chronic, without mention of hemorrhage, perforation, or obstruction   . Allergic rhinitis, cause unspecified   . Benign paroxysmal positional vertigo   . Depressive disorder, not elsewhere classified   . Esophageal reflux   . Unspecified hypothyroidism   . Localized osteoarthrosis not specified  whether primary or secondary, lower leg   . Other diseases of vocal cords   . Hypertension   . Shortness of breath dyspnea     with anxiety; with climbing stairs  . Unspecified asthma(493.90)     triggered with a virus   . Pneumonia     bilat pneumonia 1987  . Anxiety   . History of hiatal hernia   . Cancer ([place])     basal cell carcinoma per right side of nostril  . TMJ (temporomandibular joint disorder)   . Diabetes mellitus without complication ([place])     noted per H&P with Dr Dianah Field 08/03/2014      Family History  Problem Relation Age of Onset  . Heart disease Father   . Colon cancer Mother   . Liver cancer Brother   . Asthma Brother      Social History   Social History  . Marital Status: Married    Spouse Name: N/A  . Number of Children: 3  . Years of Education: N/A   Occupational History  . Retired    Social History Main Topics  . Smoking status: Former Smoker -- 0.30 packs/day for 2 years    Types: Cigarettes    Quit date: 02/12/1958  . Smokeless tobacco: Never Used  . Alcohol Use: Yes     Comment: wine socially  . Drug Use: No  . Sexual Activity: Yes   Other Topics Concern  . Not on file  Social History Narrative     Allergies  Allergen Reactions  . Astelin [Azelastine Hcl] Other (See Comments)    Headache  . Lisinopril     ACE cough  . Morphine     REACTION: Nausea  . Moxifloxacin     REACTION: confusion, dizziness, paranoia     Outpatient Prescriptions Prior to Visit  Medication Sig Dispense Refill  . albuterol (PROVENTIL HFA) 108 (90 BASE) MCG/ACT inhaler Inhale 2 puffs into the lungs every 6 (six) hours as needed. (Patient taking differently: Inhale 2 puffs into the lungs every 6 (six) hours as needed for shortness of breath. ) 6.7 g 4  . benzonatate (TESSALON) 100 MG capsule Take 1 capsule (100 mg total) by mouth 2 (two) times daily as needed for cough. 30 capsule 0  . Biotin (BIOTIN 5000) 5 MG CAPS Take 1 capsule by mouth daily.       . budesonide (PULMICORT FLEXHALER) 180 MCG/ACT inhaler INHALE 2 PUFFS INTO THE LUNGS DAILY. 1 each 5  . buPROPion (WELLBUTRIN XL) 150 MG 24 hr tablet Take 150 mg by mouth daily.      . Calcium Carbonate-Vit D-Min 600-400 MG-UNIT TABS Take 1 tablet by mouth 2 (two) times daily.     . Cholecalciferol (VITAMIN D3) 2000 UNITS TABS Take 2,000 Units by mouth daily.     . clindamycin (CLEOCIN T) 1 % lotion Apply 1 application topically 2 (two) times daily as needed (bumps).     . clonazePAM (KLONOPIN) 0.5 MG tablet Take 0.125-0.25 mg by mouth daily as needed for anxiety.     . fexofenadine (ALLEGRA) 180 MG tablet Take 180 mg by mouth at bedtime.    . folic acid (FOLVITE) 1 MG tablet Take 1 mg by mouth 2 (two) times daily.      . Ginkgo Biloba 40 MG TABS Take 1 tablet by mouth 3 (three) times daily.     Marland Kitchen levothyroxine (SYNTHROID, LEVOTHROID) 75 MCG tablet Take 75 mcg by mouth daily. Except Sunday 88 mcg    . levothyroxine (SYNTHROID, LEVOTHROID) 88 MCG tablet Take only sunday     . losartan-hydrochlorothiazide (HYZAAR) 100-25 MG per tablet TAKE 1 TABLET BY MOUTH DAILY. 90 tablet 1  . Lysine 500 MG TABS Take 1 tablet by mouth daily.    . meloxicam (MOBIC) 15 MG tablet TAKE 1 TABLET BY MOUTH EVERY MORNING 90 tablet 1  . metroNIDAZOLE (METROGEL) 1 % gel APPLY EVERY DAY (Patient taking differently: Apply 1 application topically daily. ) 60 g 1  . minoxidil (ROGAINE) 2 % external solution Apply 1 application topically 2 (two) times daily.    . Multiple Vitamins-Minerals (CENTRUM SILVER ULTRA WOMENS PO) Take 1 capsule by mouth daily.    Marland Kitchen omeprazole-sodium bicarbonate (ZEGERID) 40-1100 MG per capsule Take 1 capsule by mouth daily before breakfast.      . PARoxetine (PAXIL-CR) 12.5 MG 24 hr tablet Take 12.5 mg by mouth 2 (two) times daily.     . Saline (SIMPLY SALINE) 0.9 % AERS Place 1 spray into the nose 2 (two) times daily as needed (dry).    . fluticasone (FLONASE) 50 MCG/ACT nasal spray USE 2 SPRAYS  IN EACH NOSTRIL DAILY (Patient taking differently: USE 2 SPRAYS IN EACH NOSTRIL once daily as needed congestion) 16 g 6  . amoxicillin-clavulanate (AUGMENTIN) 875-125 MG tablet Take 1 tablet by mouth 2 (two) times daily. 20 tablet 0  . Ascorbic Acid (VITAMIN C WITH ROSE HIPS) 500 MG tablet Take 500 mg  by mouth every other day.    . polyethylene glycol (MIRALAX / GLYCOLAX) packet Take 17 g by mouth 2 (two) times daily. 14 each 0  . predniSONE (DELTASONE) 20 MG tablet Take 3 tablets for 3 days, take 2 tablets for 3 days, take 1 tablet for 3 days, and 1/2 tablet for 4 days. 21 tablet 0   No facility-administered medications prior to visit.         Objective:   Physical Exam  Filed Vitals:   01/11/15 1358  BP: 150/90  Pulse: 87  Weight: 184 lb (83.462 kg)  SpO2: 93%   Gen: Pleasant, well-nourished, in no distress,  normal affect  ENT: No lesions,  mouth clear,  oropharynx clear, no postnasal drip  Neck: No JVD, no TMG, no carotid bruits  Lungs: No use of accessory muscles, clear without rales or rhonchi  Cardiovascular: RRR, heart sounds normal,  no peripheral edema  Musculoskeletal: No deformities, no cyanosis or clubbing  Neuro: alert, non focal  Skin: Warm, no lesions or rashes      Assessment & Plan:  Allergic rhinitis   Continue your allegra daily.  Restart fluticasone nasal spray, 2 sprays each nostril daily.  Continue mucinex as needed.  Follow with Dr Lamonte Sakai in 6 months or sooner if you have any problems  VOCAL CORD DISORDER Certainly being influenced by her marginally controlled allergies. Need to treat allergies more aggressively as below.   Asthma with allergic rhinitis Continue pulmicort, albuterol prn.

## 2015-01-11 NOTE — Assessment & Plan Note (Signed)
Continue pulmicort, albuterol prn.

## 2015-01-11 NOTE — Patient Instructions (Addendum)
Please continue your pulmicort as you are taking it.  Continue your allegra daily.  Restart fluticasone nasal spray, 2 sprays each nostril daily.  Continue mucinex as needed.  Use albuterol 2 puffs if needed for shortness of breath.  Follow with Dr Lamonte Sakai in 3 months or sooner if you have any problems

## 2015-01-14 DIAGNOSIS — Z85828 Personal history of other malignant neoplasm of skin: Secondary | ICD-10-CM | POA: Diagnosis not present

## 2015-01-14 DIAGNOSIS — D2371 Other benign neoplasm of skin of right lower limb, including hip: Secondary | ICD-10-CM | POA: Diagnosis not present

## 2015-01-14 DIAGNOSIS — L821 Other seborrheic keratosis: Secondary | ICD-10-CM | POA: Diagnosis not present

## 2015-01-14 DIAGNOSIS — D225 Melanocytic nevi of trunk: Secondary | ICD-10-CM | POA: Diagnosis not present

## 2015-01-14 DIAGNOSIS — D2362 Other benign neoplasm of skin of left upper limb, including shoulder: Secondary | ICD-10-CM | POA: Diagnosis not present

## 2015-01-14 DIAGNOSIS — L738 Other specified follicular disorders: Secondary | ICD-10-CM | POA: Diagnosis not present

## 2015-01-18 ENCOUNTER — Ambulatory Visit (INDEPENDENT_AMBULATORY_CARE_PROVIDER_SITE_OTHER): Payer: Medicare Other | Admitting: Physician Assistant

## 2015-01-18 ENCOUNTER — Encounter: Payer: Self-pay | Admitting: Physician Assistant

## 2015-01-18 VITALS — BP 136/63 | HR 88 | Temp 98.3°F | Ht 66.0 in | Wt 181.0 lb

## 2015-01-18 DIAGNOSIS — J069 Acute upper respiratory infection, unspecified: Secondary | ICD-10-CM | POA: Diagnosis not present

## 2015-01-18 MED ORDER — AMOXICILLIN-POT CLAVULANATE 875-125 MG PO TABS: 1.0000 | ORAL_TABLET | Freq: Two times a day (BID) | ORAL | Status: DC

## 2015-01-18 NOTE — Patient Instructions (Addendum)
Mucinex, zinc, vitamin c, rest and hydrate.  Upper Respiratory Infection, Adult Most upper respiratory infections (URIs) are a viral infection of the air passages leading to the lungs. A URI affects the nose, throat, and upper air passages. The most common type of URI is nasopharyngitis and is typically referred to as "the common cold." URIs run their course and usually go away on their own. Most of the time, a URI does not require medical attention, but sometimes a bacterial infection in the upper airways can follow a viral infection. This is called a secondary infection. Sinus and middle ear infections are common types of secondary upper respiratory infections. Bacterial pneumonia can also complicate a URI. A URI can worsen asthma and chronic obstructive pulmonary disease (COPD). Sometimes, these complications can require emergency medical care and may be life threatening.  CAUSES Almost all URIs are caused by viruses. A virus is a type of germ and can spread from one person to another.  RISKS FACTORS You may be at risk for a URI if:   You smoke.   You have chronic heart or lung disease.  You have a weakened defense (immune) system.   You are very young or very old.   You have nasal allergies or asthma.  You work in crowded or poorly ventilated areas.  You work in health care facilities or schools. SIGNS AND SYMPTOMS  Symptoms typically develop 2-3 days after you come in contact with a cold virus. Most viral URIs last 7-10 days. However, viral URIs from the influenza virus (flu virus) can last 14-18 days and are typically more severe. Symptoms may include:   Runny or stuffy (congested) nose.   Sneezing.   Cough.   Sore throat.   Headache.   Fatigue.   Fever.   Loss of appetite.   Pain in your forehead, behind your eyes, and over your cheekbones (sinus pain).  Muscle aches.  DIAGNOSIS  Your health care provider may diagnose a URI by:  Physical  exam.  Tests to check that your symptoms are not due to another condition such as:  Strep throat.  Sinusitis.  Pneumonia.  Asthma. TREATMENT  A URI goes away on its own with time. It cannot be cured with medicines, but medicines may be prescribed or recommended to relieve symptoms. Medicines may help:  Reduce your fever.  Reduce your cough.  Relieve nasal congestion. HOME CARE INSTRUCTIONS   Take medicines only as directed by your health care provider.   Gargle warm saltwater or take cough drops to comfort your throat as directed by your health care provider.  Use a warm mist humidifier or inhale steam from a shower to increase air moisture. This may make it easier to breathe.  Drink enough fluid to keep your urine clear or pale yellow.   Eat soups and other clear broths and maintain good nutrition.   Rest as needed.   Return to work when your temperature has returned to normal or as your health care provider advises. You may need to stay home longer to avoid infecting others. You can also use a face mask and careful hand washing to prevent spread of the virus.  Increase the usage of your inhaler if you have asthma.   Do not use any tobacco products, including cigarettes, chewing tobacco, or electronic cigarettes. If you need help quitting, ask your health care provider. PREVENTION  The best way to protect yourself from getting a cold is to practice good hygiene.   Avoid  oral or hand contact with people with cold symptoms.   Wash your hands often if contact occurs.  There is no clear evidence that vitamin C, vitamin E, echinacea, or exercise reduces the chance of developing a cold. However, it is always recommended to get plenty of rest, exercise, and practice good nutrition.  SEEK MEDICAL CARE IF:   You are getting worse rather than better.   Your symptoms are not controlled by medicine.   You have chills.  You have worsening shortness of breath.  You  have brown or red mucus.  You have yellow or brown nasal discharge.  You have pain in your face, especially when you bend forward.  You have a fever.  You have swollen neck glands.  You have pain while swallowing.  You have white areas in the back of your throat. SEEK IMMEDIATE MEDICAL CARE IF:   You have severe or persistent:  Headache.  Ear pain.  Sinus pain.  Chest pain.  You have chronic lung disease and any of the following:  Wheezing.  Prolonged cough.  Coughing up blood.  A change in your usual mucus.  You have a stiff neck.  You have changes in your:  Vision.  Hearing.  Thinking.  Mood. MAKE SURE YOU:   Understand these instructions.  Will watch your condition.  Will get help right away if you are not doing well or get worse.   This information is not intended to replace advice given to you by your health care provider. Make sure you discuss any questions you have with your health care provider.   Document Released: 07/25/2000 Document Revised: 06/15/2014 Document Reviewed: 05/06/2013 Elsevier Interactive Patient Education [employer].

## 2015-01-18 NOTE — Progress Notes (Signed)
   Subjective:    Patient ID: [name], female    DOB: 05/22/1945, 69 y.o.   MRN: [id number]  HPI Patient is a 69 year old female who presents to the clinic with cough, headache, bilateral ear pain and nasal congestion. She has a long history of allergies. She sees pulmonology for this. She is on daily Flonase. She has not been taking Mucinex regularly but takes it on occasion. She reports a low-grade temperature of around 99 last night. She became symptomatic yesterday morning. She continues to get worse. Her husband, daughter and grandson are all sick. Her grandson has a double ear infection. She denies any chills or body aches. Her cough is mostly dry. She does have some sinus pressure mostly on the left side.   Review of Systems  All other systems reviewed and are negative.      Objective:   Physical Exam  Constitutional: She is oriented to person, place, and time. She appears well-developed and well-nourished.  HENT:  Head: Normocephalic and atraumatic.  Right Ear: External ear normal.  Left Ear: External ear normal.  Mouth/Throat: No oropharyngeal exudate.  TMs clear bilaterally.  Tenderness over left maxillary sinus to palpation.  Bilateral nasal turbinates red and swollen with rhinorrhea  Oropharynx erythematous without any tonsillar swelling or exudate.  Eyes: Conjunctivae are normal. Right eye exhibits no discharge. Left eye exhibits no discharge.  Neck: Normal range of motion. Neck supple.  Cardiovascular: Normal rate, regular rhythm and normal heart sounds.   Pulmonary/Chest: Effort normal and breath sounds normal. She has no wheezes.  Lymphadenopathy:    She has no cervical adenopathy.  Neurological: She is alert and oriented to person, place, and time.  Psychiatric: She has a normal mood and affect. Her behavior is normal.          Assessment & Plan:  Acute upper respiratory infection-discuss with patient due to onset and multiple persons and home being  sick this is likely viral. Reassured patient that lungs sound great. Reassured patient that ears look great. Continue using Flonase. Add Mucinex twice a day. Take a loading dose of vitamin C as well as zinc to help with viral illness. Rest and hydrate. If not improving in 24-36 hours may consider Augmentin antibiotic. Warned patient of starting antibiotic to soon.

## 2015-02-13 LAB — LIPID PANEL
Cholesterol: 201 mg/dL — AB (ref 0–200)
HDL: 58 mg/dL (ref 35–70)
LDL CALC: 119 mg/dL
TRIGLYCERIDES: 118 mg/dL (ref 40–160)

## 2015-02-18 ENCOUNTER — Encounter: Payer: Self-pay | Admitting: Family Medicine

## 2015-02-18 ENCOUNTER — Ambulatory Visit (INDEPENDENT_AMBULATORY_CARE_PROVIDER_SITE_OTHER): Payer: Medicare Other | Admitting: Family Medicine

## 2015-02-18 VITALS — BP 139/70 | HR 81 | Temp 97.8°F | Wt 184.0 lb

## 2015-02-18 DIAGNOSIS — L308 Other specified dermatitis: Secondary | ICD-10-CM | POA: Diagnosis not present

## 2015-02-18 DIAGNOSIS — L989 Disorder of the skin and subcutaneous tissue, unspecified: Secondary | ICD-10-CM | POA: Diagnosis not present

## 2015-02-18 DIAGNOSIS — T63301D Toxic effect of unspecified spider venom, accidental (unintentional), subsequent encounter: Secondary | ICD-10-CM

## 2015-02-18 NOTE — Progress Notes (Signed)
   Subjective:    Patient ID: [name], female    DOB: 10/31/1945, 70 y.o.   MRN: [id number]  HPI She comes in today to have me evaluate an old wound. She had a spider bite about a year and a half ago. About 6 months after the initial bite she hit the lesion on the edge of her car door and ever since then has had difficulty with healing. It seems to keep opening and bleeding overnight over. She denies any heat or warmth to the area. No fevers chills or sweats and no pus or drainage. She is just concerned because it is not healing.     Review of Systems     Objective:   Physical Exam  Constitutional: She is oriented to person, place, and time. She appears well-developed and well-nourished.  HENT:  Head: Normocephalic and atraumatic.  Eyes: Conjunctivae and EOM are normal.  Cardiovascular: Normal rate.   Pulmonary/Chest: Effort normal.  Neurological: She is alert and oriented to person, place, and time.  Skin: Skin is dry. No pallor.  On the left outer lower leg she has an approximately 1-1/2 cm area that has a purplish discoloration. Towards the center and this has more of a pink scar tissue with a small scab in the center.  Psychiatric: She has a normal mood and affect. Her behavior is normal.  Vitals reviewed.         Assessment & Plan:  Right-poor wound healing-biopsy performed. Will send to pathology for further evaluation.  Shave Biopsy Procedure Note  Pre-operative Diagnosis: Suspicious lesion  Post-operative Diagnosis: same  Locations:left lower leg  Indications: bleeding and irriation  Anesthesia: Lidocaine 1% without epinephrine without added sodium bicarbonate  Procedure Details  History of allergy to iodine: no  Patient informed of the risks (including bleeding and infection) and benefits of the  procedure and Verbal informed consent obtained.  The lesion and surrounding area were given a sterile prep using chlorhexidine and draped in the usual  sterile fashion. A scalpel was used to shave an area of skin approximately 1.2cm by 1.2cm.  Hemostasis achieved with alumuninum chloride. Antibiotic ointment and a sterile dressing applied.  The specimen was sent for pathologic examination. The patient tolerated the procedure well.  EBL: 0 ml  Findings: Await pathology   Condition: Stable  Complications: none.  Plan: 1. Instructed to keep the wound dry and covered for 24-48h and clean thereafter. 2. Warning signs of infection were reviewed.   3. Recommended that the patient use OTC acetaminophen as needed for pain.  4. Return PRN.

## 2015-03-02 DIAGNOSIS — E559 Vitamin D deficiency, unspecified: Secondary | ICD-10-CM | POA: Diagnosis not present

## 2015-03-02 DIAGNOSIS — E7800 Pure hypercholesterolemia, unspecified: Secondary | ICD-10-CM | POA: Diagnosis not present

## 2015-03-02 DIAGNOSIS — E038 Other specified hypothyroidism: Secondary | ICD-10-CM | POA: Diagnosis not present

## 2015-03-02 DIAGNOSIS — R5382 Chronic fatigue, unspecified: Secondary | ICD-10-CM | POA: Diagnosis not present

## 2015-03-02 DIAGNOSIS — R7301 Impaired fasting glucose: Secondary | ICD-10-CM | POA: Diagnosis not present

## 2015-03-03 ENCOUNTER — Ambulatory Visit (INDEPENDENT_AMBULATORY_CARE_PROVIDER_SITE_OTHER): Payer: Medicare Other | Admitting: Family Medicine

## 2015-03-03 ENCOUNTER — Encounter: Payer: Self-pay | Admitting: Family Medicine

## 2015-03-03 VITALS — BP 144/72 | HR 86 | Temp 97.9°F | Wt 184.0 lb

## 2015-03-03 DIAGNOSIS — I83029 Varicose veins of left lower extremity with ulcer of unspecified site: Secondary | ICD-10-CM | POA: Diagnosis not present

## 2015-03-03 DIAGNOSIS — L97929 Non-pressure chronic ulcer of unspecified part of left lower leg with unspecified severity: Principal | ICD-10-CM

## 2015-03-03 HISTORY — DX: Varicose veins of left lower extremity with ulcer of unspecified site: I83.029

## 2015-03-03 MED ORDER — MUPIROCIN 2 % EX OINT: TOPICAL_OINTMENT | CUTANEOUS | Status: DC

## 2015-03-03 NOTE — Assessment & Plan Note (Signed)
Non-healing likely due to betadine and neosporin placement. Skin erythema and history not consistent with cellulitis picture, more likely irritation from bandage given timeline. Refer to wound clinic. Apply mupirocin with vaseline and follow up in 5 days or sooner if worsening.

## 2015-03-03 NOTE — Patient Instructions (Signed)
Thank you for coming in today. STOP Betadine.  Use the new ointment.  Avoid skin adhesives.  Return next week.   Venous Ulcer A venous ulcer is a shallow sore on your lower leg that is caused by poor circulation in your veins. This condition used to be called stasis ulcer.  Veins have valves that help return blood to the heart. If these valves do not work properly, it can cause blood to flow backward and to back up into the veins near the skin. When that happens, blood can pool in your lower legs. The blood can then leak out of your veins, which can irritate your skin. This may cause a break in your skin that becomes a venous ulcer. Venous ulcer is the most common type of lower leg ulcer. You may have venous ulcers on one leg or on both legs. The area where this condition most commonly develops is around the ankles. A venous ulcer may last for a long time (chronic ulcer) or it may return repeatedly (recurrent ulcer). CAUSES Any condition that causes poor circulation to your legs can lead to a venous ulcer.  RISK FACTORS This condition is more likely to develop in:  People who are 8 years of age or older.  People who are overweight.  People who are not active.  People who have had a leg ulcer in the past.  People who have clots in their lower leg veins (deep vein thrombosis).  People who have inflammation of their leg veins (phlebitis).  Women who have given birth.  People who smoke. SYMPTOMS  The most common symptom of this condition is an open sore near your ankle. Other symptoms may include:  Swelling.  Thickening of the skin.  Fluid leaking from the ulcer.  Bleeding.  Itching.  Pain and swelling that gets worse when you stand up and feels better when you raise your leg.  Blotchy skin.  Darkening of the skin. DIAGNOSIS  Your health care provider may suspect a venous ulcer based on your medical history and your risk factors. Your health care provider will check the  skin on your legs. Other tests may be done to learn more about the ulcer and to determine the best way to treat it. Tests that may be done include:  Measuring the blood pressure in your arms and legs.  Using sound waves (ultrasound) to measure the blood flow in your leg veins. TREATMENT You may need to try several different types of treatment to get your venous ulcer to heal. Healing may take a long time. Treatment may include:  Keeping your leg raised (elevated).  Wearing a type of bandage or stocking to compress the veins of your leg (compression therapy). Venous wounds are not likely to heal or to stay healed without compression.  Taking medicines to improve blood flow.  Taking antibiotic medicines to treat infection.  Cleaning your ulcer and removing any dead tissue from the wound (debridement).  Placing various types of medicated bandage (dressings) or medicated wraps on your ulcer. This helps the ulcer to heal and helps to prevent infection. Surgery is sometimes needed to close the wound using a piece of skin taken from another area of your body (graft). You may need surgery if other treatments are not working or if your ulcer is very deep. HOME CARE INSTRUCTIONS Wound Care  Follow instructions from your health care provider about:  How to take care of your wound.  When and how you should change your bandage (  dressing).  When you should remove your dressing. If your dressing is dry and sticks to your leg when you try to remove it, moisten or wet the dressing with saline solution or water so that the dressing can be removed without harming your skin or wound tissue.  Check your wound every day for signs of infection. Have a caregiver do this for you if you are not able to do it yourself. Check for:  More redness, swelling, or pain. More fluid or blood.  Pus, warmth, or a bad smell. Medicines   Take over-the-counter and prescription medicines only as told by your health  care provider.  If you were prescribed an antibiotic medicine, take it or apply it as told by your health care provider. Do not stop taking or using the antibiotic even if your condition improves. Activity  Do not stand or sit in one position for a long period of time. Rest with your legs raised during the day. If possible, keep your legs above your heart for 30 minutes, 3-4 times a day, or as told by your health care provider.  Do not sit with your legs crossed.   Walk often to increase the blood flow in your legs.Ask your health care provider what level of activity is safe for you.  If you are taking a long ride in a car or plane, take a break to walk around at least once every two hours, or as often as your health care provider recommends. Ask your health care provider if you should take aspirin before long trips.  General Instructions  Wear elastic stockings, compression stockings, or support hose as told by your health care provider. This is very important.  Raise the foot of your bed as told by your health care provider.  Do not smoke.  Keep all follow-up visits as told by your health care provider. This is important. SEEK MEDICAL CARE IF:   You have a fever.   Your ulcer is getting larger or is not healing.   Your pain gets worse.   You have more redness or swelling around your ulcer.  You have more fluid, blood, or pus coming from your ulcer after it has been cleaned by you or your health care provider.  You have warmth or a bad smell coming from your ulcer.   This information is not intended to replace advice given to you by your health care provider. Make sure you discuss any questions you have with your health care provider.   Document Released: 10/24/2000 Document Revised: 10/20/2014 Document Reviewed: 06/09/2014 Elsevier Interactive Patient Education [employer].

## 2015-03-03 NOTE — Progress Notes (Signed)
[name] is a 70 y.o. female who presents to [facility] today for wound check.  Patient has a 2.5 year history of left lateral leg wound secondary to spider bite and re-opened 1 year prior with poor healing since. The wound was biopsied 2 weeks ago and found as venous stasis ulcer with chronic inflammation. Since then, patient reports intermittent 6/10 pain around the wound with intermittent itchiness. She has been applying betadine and neosporin daily. She notes a surrounding erythema on the skin that has been present since applying bandages after the biopsy. Patient notes the wound was purulent with yellow discharge and bloody this AM which prompted seeking care. Patient denies fever, chills, progressive erythema spread, warmth of skin around wound.   Past Medical History  Diagnosis Date  . Other and unspecified hyperlipidemia   . Unspecified arthropathy, hand   . Peptic ulcer, unspecified site, unspecified as acute or chronic, without mention of hemorrhage, perforation, or obstruction   . Allergic rhinitis, cause unspecified   . Benign paroxysmal positional vertigo   . Depressive disorder, not elsewhere classified   . Esophageal reflux   . Unspecified hypothyroidism   . Localized osteoarthrosis not specified whether primary or secondary, lower leg   . Other diseases of vocal cords   . Hypertension   . Shortness of breath dyspnea     with anxiety; with climbing stairs  . Unspecified asthma(493.90)     triggered with a virus   . Pneumonia     bilat pneumonia 1987  . Anxiety   . History of hiatal hernia   . Cancer ([place])     basal cell carcinoma per right side of nostril  . TMJ (temporomandibular joint disorder)   . Diabetes mellitus without complication ([place])     noted per H&P with Dr Dianah Field 08/03/2014    Past Surgical History  Procedure Laterality Date  . Nasal  sinus surgery    . Knee arthroscopy      bilat   . Rotator cuff repair      right   . Thumb surgery       left hand 45 years ago   . Breast surgery      LUMPECTOMY / LEFT 10/12/2011  . Total hip arthroplasty Right 08/10/2014    Procedure: RIGHT TOTAL HIP ARTHROPLASTY ANTERIOR APPROACH;  Surgeon: Paralee Cancel, MD;  Location: [facility];  Service: Orthopedics;  Laterality: Right;   Social History  Substance Use Topics  . Smoking status: Former Smoker -- 0.30 packs/day for 2 years    Types: Cigarettes    Quit date: 02/12/1958  . Smokeless tobacco: Never Used  . Alcohol Use: Yes     Comment: wine socially   family history includes Asthma in her brother; Colon cancer in her mother; Heart disease in her father; Liver cancer in her brother.  ROS as above Medications: Current Outpatient Prescriptions  Medication Sig Dispense Refill  . albuterol (PROVENTIL HFA) 108 (90 BASE) MCG/ACT inhaler Inhale 2 puffs into the lungs every 6 (six) hours as needed. (Patient taking differently: Inhale 2 puffs into the lungs every 6 (six) hours as needed for shortness of breath. ) 6.7 g 4  . Biotin (BIOTIN 5000) 5 MG CAPS Take 1 capsule by mouth daily.      . budesonide (PULMICORT FLEXHALER) 180 MCG/ACT inhaler INHALE 2 PUFFS INTO THE LUNGS DAILY. 1 each 5  . buPROPion (WELLBUTRIN XL) 150 MG 24 hr tablet  Take 150 mg by mouth daily.      . Calcium Carbonate-Vit D-Min 600-400 MG-UNIT TABS Take 1 tablet by mouth 2 (two) times daily.     . Cholecalciferol (VITAMIN D3) 2000 UNITS TABS Take 2,000 Units by mouth daily.     . clindamycin (CLEOCIN T) 1 % lotion Apply 1 application topically 2 (two) times daily as needed (bumps).     . clonazePAM (KLONOPIN) 0.5 MG tablet Take 0.125-0.25 mg by mouth daily as needed for anxiety.     . fexofenadine (ALLEGRA) 180 MG tablet Take 180 mg by mouth at bedtime.    . fluticasone (FLONASE) 50 MCG/ACT nasal spray Place 2 sprays into both nostrils daily as needed for allergies or  rhinitis.    . folic acid (FOLVITE) 1 MG tablet Take 1 mg by mouth 2 (two) times daily.      . Ginkgo Biloba 40 MG TABS Take 1 tablet by mouth 3 (three) times daily.     Marland Kitchen guaiFENesin (MUCINEX) 600 MG 12 hr tablet Take 1,200 mg by mouth 2 (two) times daily.    Marland Kitchen levothyroxine (SYNTHROID, LEVOTHROID) 75 MCG tablet Take 75 mcg by mouth daily. Except Sunday 88 mcg    . levothyroxine (SYNTHROID, LEVOTHROID) 88 MCG tablet Take only sunday     . losartan-hydrochlorothiazide (HYZAAR) 100-25 MG per tablet TAKE 1 TABLET BY MOUTH DAILY. 90 tablet 1  . Lysine 500 MG TABS Take 1 tablet by mouth daily.    . metroNIDAZOLE (METROGEL) 1 % gel APPLY EVERY DAY (Patient taking differently: Apply 1 application topically daily. ) 60 g 1  . minoxidil (ROGAINE) 2 % external solution Apply 1 application topically 2 (two) times daily.    . Multiple Vitamins-Minerals (CENTRUM SILVER ULTRA WOMENS PO) Take 1 capsule by mouth daily.    Marland Kitchen omeprazole-sodium bicarbonate (ZEGERID) 40-1100 MG per capsule Take 1 capsule by mouth daily before breakfast.      . PARoxetine (PAXIL-CR) 12.5 MG 24 hr tablet Take 12.5 mg by mouth 2 (two) times daily.     . Saline (SIMPLY SALINE) 0.9 % AERS Place 1 spray into the nose 2 (two) times daily as needed (dry).    . mupirocin ointment (BACTROBAN) 2 % Apply to affected area TID for 7 days. 30 g 3   No current facility-administered medications for this visit.   Allergies  Allergen Reactions  . Astelin [Azelastine Hcl] Other (See Comments)    Headache  . Lisinopril     ACE cough  . Morphine     REACTION: Nausea  . Moxifloxacin     REACTION: confusion, dizziness, paranoia     Exam:  BP 144/72 mmHg  Pulse 86  Temp(Src) 97.9 F (36.6 C) (Oral)  Wt 184 lb (83.462 kg) Gen: Well appearing lady in NAD HEENT: EOMI,  MMM Lungs: Normal work of breathing. CTABL Heart: RRR no MRG Abd: NABS, Soft. Nondistended, Nontender Exts: Brisk capillary refill, warm and well perfused. Multiple  varicosities in bilateral legs  Skin: 1.5 cm circular lesion on left lower leg, 4-5 cm of surrounding erythema, the size and distribution of the bandages being used to cover it.  The skin is nontender with no induration or fluctuance. No pus is expressible from the wound.   No results found for this or any previous visit (from the past 24 hour(s)). No results found.   Please see individual assessment and plan sections.

## 2015-03-08 ENCOUNTER — Encounter: Payer: Self-pay | Admitting: Family Medicine

## 2015-03-08 ENCOUNTER — Ambulatory Visit (INDEPENDENT_AMBULATORY_CARE_PROVIDER_SITE_OTHER): Payer: Medicare Other | Admitting: Family Medicine

## 2015-03-08 VITALS — BP 147/75 | HR 84 | Wt 183.0 lb

## 2015-03-08 DIAGNOSIS — I83029 Varicose veins of left lower extremity with ulcer of unspecified site: Secondary | ICD-10-CM | POA: Diagnosis not present

## 2015-03-08 DIAGNOSIS — E559 Vitamin D deficiency, unspecified: Secondary | ICD-10-CM | POA: Diagnosis not present

## 2015-03-08 DIAGNOSIS — E7800 Pure hypercholesterolemia, unspecified: Secondary | ICD-10-CM | POA: Diagnosis not present

## 2015-03-08 DIAGNOSIS — L97929 Non-pressure chronic ulcer of unspecified part of left lower leg with unspecified severity: Principal | ICD-10-CM

## 2015-03-08 DIAGNOSIS — L659 Nonscarring hair loss, unspecified: Secondary | ICD-10-CM | POA: Diagnosis not present

## 2015-03-08 DIAGNOSIS — E038 Other specified hypothyroidism: Secondary | ICD-10-CM | POA: Diagnosis not present

## 2015-03-08 DIAGNOSIS — R7301 Impaired fasting glucose: Secondary | ICD-10-CM | POA: Diagnosis not present

## 2015-03-08 DIAGNOSIS — R5382 Chronic fatigue, unspecified: Secondary | ICD-10-CM | POA: Diagnosis not present

## 2015-03-08 MED ORDER — MUPIROCIN 2 % EX OINT: TOPICAL_OINTMENT | CUTANEOUS | Status: DC

## 2015-03-08 NOTE — Patient Instructions (Signed)
Thank you for coming in today. Your left leg is looking much better.  Continue the mupirocin and vaseline Please come back in one month!

## 2015-03-08 NOTE — Progress Notes (Signed)
[name] is a 70 y.o. female who presents to [facility] today for wound check.  Wound is healing very well per patient. Decreased itching and erythema surrounding the wound. She has dressings and the mupirocin and vaseline is working well, though does need more refills. She would like to avoid wound care visit given improvement.    Past Medical History  Diagnosis Date  . Other and unspecified hyperlipidemia   . Unspecified arthropathy, hand   . Peptic ulcer, unspecified site, unspecified as acute or chronic, without mention of hemorrhage, perforation, or obstruction   . Allergic rhinitis, cause unspecified   . Benign paroxysmal positional vertigo   . Depressive disorder, not elsewhere classified   . Esophageal reflux   . Unspecified hypothyroidism   . Localized osteoarthrosis not specified whether primary or secondary, lower leg   . Other diseases of vocal cords   . Hypertension   . Shortness of breath dyspnea     with anxiety; with climbing stairs  . Unspecified asthma(493.90)     triggered with a virus   . Pneumonia     bilat pneumonia 1987  . Anxiety   . History of hiatal hernia   . Cancer ([place])     basal cell carcinoma per right side of nostril  . TMJ (temporomandibular joint disorder)   . Diabetes mellitus without complication ([place])     noted per H&P with Dr Dianah Field 08/03/2014    Past Surgical History  Procedure Laterality Date  . Nasal sinus surgery    . Knee arthroscopy      bilat   . Rotator cuff repair      right   . Thumb surgery       left hand 45 years ago   . Breast surgery      LUMPECTOMY / LEFT 10/12/2011  . Total hip arthroplasty Right 08/10/2014    Procedure: RIGHT TOTAL HIP ARTHROPLASTY ANTERIOR APPROACH;  Surgeon: Paralee Cancel, MD;  Location: [facility];  Service: Orthopedics;  Laterality: Right;   Social History  Substance Use Topics    . Smoking status: Former Smoker -- 0.30 packs/day for 2 years    Types: Cigarettes    Quit date: 02/12/1958  . Smokeless tobacco: Never Used  . Alcohol Use: Yes     Comment: wine socially   family history includes Asthma in her brother; Colon cancer in her mother; Heart disease in her father; Liver cancer in her brother.  ROS as above Medications: Current Outpatient Prescriptions  Medication Sig Dispense Refill  . albuterol (PROVENTIL HFA) 108 (90 BASE) MCG/ACT inhaler Inhale 2 puffs into the lungs every 6 (six) hours as needed. (Patient taking differently: Inhale 2 puffs into the lungs every 6 (six) hours as needed for shortness of breath. ) 6.7 g 4  . Biotin (BIOTIN 5000) 5 MG CAPS Take 1 capsule by mouth daily.      . budesonide (PULMICORT FLEXHALER) 180 MCG/ACT inhaler INHALE 2 PUFFS INTO THE LUNGS DAILY. 1 each 5  . buPROPion (WELLBUTRIN XL) 150 MG 24 hr tablet Take 150 mg by mouth daily.      . Calcium Carbonate-Vit D-Min 600-400 MG-UNIT TABS Take 1 tablet by mouth 2 (two) times daily.     . Cholecalciferol (VITAMIN D3) 2000 UNITS TABS Take 2,000 Units by mouth daily.     . clindamycin (CLEOCIN T) 1 % lotion Apply 1 application topically 2 (two) times daily as needed (bumps).     Marland Kitchen  clonazePAM (KLONOPIN) 0.5 MG tablet Take 0.125-0.25 mg by mouth daily as needed for anxiety.     . fexofenadine (ALLEGRA) 180 MG tablet Take 180 mg by mouth at bedtime.    . fluticasone (FLONASE) 50 MCG/ACT nasal spray Place 2 sprays into both nostrils daily as needed for allergies or rhinitis.    . folic acid (FOLVITE) 1 MG tablet Take 1 mg by mouth 2 (two) times daily.      . Ginkgo Biloba 40 MG TABS Take 1 tablet by mouth 3 (three) times daily.     Marland Kitchen guaiFENesin (MUCINEX) 600 MG 12 hr tablet Take 1,200 mg by mouth 2 (two) times daily.    Marland Kitchen levothyroxine (SYNTHROID, LEVOTHROID) 75 MCG tablet Take 75 mcg by mouth daily. Except Sunday 88 mcg    . levothyroxine (SYNTHROID, LEVOTHROID) 88 MCG tablet Take  only sunday     . losartan-hydrochlorothiazide (HYZAAR) 100-25 MG per tablet TAKE 1 TABLET BY MOUTH DAILY. 90 tablet 1  . Lysine 500 MG TABS Take 1 tablet by mouth daily.    . metroNIDAZOLE (METROGEL) 1 % gel APPLY EVERY DAY (Patient taking differently: Apply 1 application topically daily. ) 60 g 1  . minoxidil (ROGAINE) 2 % external solution Apply 1 application topically 2 (two) times daily.    . Multiple Vitamins-Minerals (CENTRUM SILVER ULTRA WOMENS PO) Take 1 capsule by mouth daily.    . mupirocin ointment (BACTROBAN) 2 % Apply to affected area TID for 7 days. 30 g 12  . omeprazole-sodium bicarbonate (ZEGERID) 40-1100 MG per capsule Take 1 capsule by mouth daily before breakfast.      . PARoxetine (PAXIL-CR) 12.5 MG 24 hr tablet Take 12.5 mg by mouth 2 (two) times daily.     . Saline (SIMPLY SALINE) 0.9 % AERS Place 1 spray into the nose 2 (two) times daily as needed (dry).     No current facility-administered medications for this visit.   Allergies  Allergen Reactions  . Astelin [Azelastine Hcl] Other (See Comments)    Headache  . Lisinopril     ACE cough  . Morphine     REACTION: Nausea  . Moxifloxacin     REACTION: confusion, dizziness, paranoia     Exam:  BP 147/75 mmHg  Pulse 84  Wt 183 lb (83.008 kg) Gen: Well appearing lady in NAD HEENT: EOMI,  MMM Lungs: Normal work of breathing. CTABL Heart: RRR no MRG Abd: NABS, Soft. Nondistended, Nontender Exts: Brisk capillary refill, warm and well perfused.  Left anterolateral mid-leg with 1.5 cm diameter ulcerated lesion with 34mm ring of erythema on surrounding skin.  No results found for this or any previous visit (from the past 24 hour(s)). No results found.   Please see individual assessment and plan sections.

## 2015-03-08 NOTE — Assessment & Plan Note (Signed)
Healing well compared to last week. Likely local bacterial infection given response to mupirocin and vaseline. Will follow up in 1 month and consider wound clinic if no improvement.

## 2015-03-10 ENCOUNTER — Encounter: Payer: Self-pay | Admitting: Family Medicine

## 2015-03-10 DIAGNOSIS — H04123 Dry eye syndrome of bilateral lacrimal glands: Secondary | ICD-10-CM | POA: Diagnosis not present

## 2015-03-10 DIAGNOSIS — S0502XA Injury of conjunctiva and corneal abrasion without foreign body, left eye, initial encounter: Secondary | ICD-10-CM | POA: Diagnosis not present

## 2015-03-17 ENCOUNTER — Other Ambulatory Visit: Payer: Self-pay | Admitting: Family Medicine

## 2015-04-05 ENCOUNTER — Ambulatory Visit (INDEPENDENT_AMBULATORY_CARE_PROVIDER_SITE_OTHER): Payer: Medicare Other | Admitting: Family Medicine

## 2015-04-05 ENCOUNTER — Encounter: Payer: Self-pay | Admitting: Family Medicine

## 2015-04-05 VITALS — BP 130/69 | HR 83 | Temp 98.0°F | Wt 185.0 lb

## 2015-04-05 DIAGNOSIS — I10 Essential (primary) hypertension: Secondary | ICD-10-CM | POA: Diagnosis not present

## 2015-04-05 DIAGNOSIS — J069 Acute upper respiratory infection, unspecified: Secondary | ICD-10-CM

## 2015-04-05 DIAGNOSIS — R52 Pain, unspecified: Secondary | ICD-10-CM

## 2015-04-05 LAB — POCT INFLUENZA A/B
Influenza A, POC: NEGATIVE
Influenza B, POC: NEGATIVE

## 2015-04-05 NOTE — Progress Notes (Signed)
   Subjective:    Patient ID: [name], female    DOB: Nov 17, 1945, 70 y.o.   MRN: [id number]  HPI Hypertension- Pt denies chest pain, SOB, dizziness, or heart palpitations.  Taking meds as directed w/o problems.  Denies medication side effects.    She started feeling bad 3 days ago. Says she has a runny nose, mild cough, mild ST, and feeling tired and achey. Has been taking care of her 3 grandkids. She has had some low grade fevers as well.  No GI sxs.    Review of Systems     Objective:   Physical Exam  Constitutional: She is oriented to person, place, and time. She appears well-developed and well-nourished.  HENT:  Head: Normocephalic and atraumatic.  Right Ear: External ear normal.  Left Ear: External ear normal.  Nose: Nose normal.  Mouth/Throat: Oropharynx is clear and moist.  TMs and canals are clear.   Eyes: Conjunctivae and EOM are normal. Pupils are equal, round, and reactive to light.  Neck: Neck supple. No thyromegaly present.  Cardiovascular: Normal rate, regular rhythm and normal heart sounds.   Pulmonary/Chest: Effort normal and breath sounds normal. She has no wheezes.  Lymphadenopathy:    She has no cervical adenopathy.  Neurological: She is alert and oriented to person, place, and time.  Skin: Skin is warm and dry.  Psychiatric: She has a normal mood and affect.          Assessment & Plan:  HTN - well controlled. Continue current regimen. Follow-up in 6 months.  URI - call if not better in one week.  Symptomatic care. Neg for flu.

## 2015-04-05 NOTE — Patient Instructions (Addendum)
Can start your mederma on the wound.  Upper Respiratory Infection, Adult Most upper respiratory infections (URIs) are a viral infection of the air passages leading to the lungs. A URI affects the nose, throat, and upper air passages. The most common type of URI is nasopharyngitis and is typically referred to as "the common cold." URIs run their course and usually go away on their own. Most of the time, a URI does not require medical attention, but sometimes a bacterial infection in the upper airways can follow a viral infection. This is called a secondary infection. Sinus and middle ear infections are common types of secondary upper respiratory infections. Bacterial pneumonia can also complicate a URI. A URI can worsen asthma and chronic obstructive pulmonary disease (COPD). Sometimes, these complications can require emergency medical care and may be life threatening.  CAUSES Almost all URIs are caused by viruses. A virus is a type of germ and can spread from one person to another.  RISKS FACTORS You may be at risk for a URI if:   You smoke.   You have chronic heart or lung disease.  You have a weakened defense (immune) system.   You are very young or very old.   You have nasal allergies or asthma.  You work in crowded or poorly ventilated areas.  You work in health care facilities or schools. SIGNS AND SYMPTOMS  Symptoms typically develop 2-3 days after you come in contact with a cold virus. Most viral URIs last 7-10 days. However, viral URIs from the influenza virus (flu virus) can last 14-18 days and are typically more severe. Symptoms may include:   Runny or stuffy (congested) nose.   Sneezing.   Cough.   Sore throat.   Headache.   Fatigue.   Fever.   Loss of appetite.   Pain in your forehead, behind your eyes, and over your cheekbones (sinus pain).  Muscle aches.  DIAGNOSIS  Your health care provider may diagnose a URI by:  Physical exam.  Tests to  check that your symptoms are not due to another condition such as:  Strep throat.  Sinusitis.  Pneumonia.  Asthma. TREATMENT  A URI goes away on its own with time. It cannot be cured with medicines, but medicines may be prescribed or recommended to relieve symptoms. Medicines may help:  Reduce your fever.  Reduce your cough.  Relieve nasal congestion. HOME CARE INSTRUCTIONS   Take medicines only as directed by your health care provider.   Gargle warm saltwater or take cough drops to comfort your throat as directed by your health care provider.  Use a warm mist humidifier or inhale steam from a shower to increase air moisture. This may make it easier to breathe.  Drink enough fluid to keep your urine clear or pale yellow.   Eat soups and other clear broths and maintain good nutrition.   Rest as needed.   Return to work when your temperature has returned to normal or as your health care provider advises. You may need to stay home longer to avoid infecting others. You can also use a face mask and careful hand washing to prevent spread of the virus.  Increase the usage of your inhaler if you have asthma.   Do not use any tobacco products, including cigarettes, chewing tobacco, or electronic cigarettes. If you need help quitting, ask your health care provider. PREVENTION  The best way to protect yourself from getting a cold is to practice good hygiene.   Avoid  oral or hand contact with people with cold symptoms.   Wash your hands often if contact occurs.  There is no clear evidence that vitamin C, vitamin E, echinacea, or exercise reduces the chance of developing a cold. However, it is always recommended to get plenty of rest, exercise, and practice good nutrition.  SEEK MEDICAL CARE IF:   You are getting worse rather than better.   Your symptoms are not controlled by medicine.   You have chills.  You have worsening shortness of breath.  You have brown or red  mucus.  You have yellow or brown nasal discharge.  You have pain in your face, especially when you bend forward.  You have a fever.  You have swollen neck glands.  You have pain while swallowing.  You have white areas in the back of your throat. SEEK IMMEDIATE MEDICAL CARE IF:   You have severe or persistent:  Headache.  Ear pain.  Sinus pain.  Chest pain.  You have chronic lung disease and any of the following:  Wheezing.  Prolonged cough.  Coughing up blood.  A change in your usual mucus.  You have a stiff neck.  You have changes in your:  Vision.  Hearing.  Thinking.  Mood. MAKE SURE YOU:   Understand these instructions.  Will watch your condition.  Will get help right away if you are not doing well or get worse.   This information is not intended to replace advice given to you by your health care provider. Make sure you discuss any questions you have with your health care provider.   Document Released: 07/25/2000 Document Revised: 06/15/2014 Document Reviewed: 05/06/2013 Elsevier Interactive Patient Education [employer].

## 2015-04-12 ENCOUNTER — Ambulatory Visit (INDEPENDENT_AMBULATORY_CARE_PROVIDER_SITE_OTHER): Payer: Medicare Other | Admitting: Emergency Medicine

## 2015-04-12 ENCOUNTER — Encounter: Payer: Self-pay | Admitting: Emergency Medicine

## 2015-04-12 VITALS — BP 122/82 | HR 80 | Wt 183.0 lb

## 2015-04-12 DIAGNOSIS — J4540 Moderate persistent asthma, uncomplicated: Secondary | ICD-10-CM

## 2015-04-12 DIAGNOSIS — J019 Acute sinusitis, unspecified: Secondary | ICD-10-CM | POA: Insufficient documentation

## 2015-04-12 DIAGNOSIS — J309 Allergic rhinitis, unspecified: Secondary | ICD-10-CM

## 2015-04-12 MED ORDER — AMOXICILLIN-POT CLAVULANATE 875-125 MG PO TABS: 1.0000 | ORAL_TABLET | Freq: Two times a day (BID) | ORAL | Status: DC

## 2015-04-12 NOTE — Assessment & Plan Note (Signed)
Following recent upper respiratory infection. We will treat this with Augmentin, continue her allergy regimen, continue Pulmicort

## 2015-04-12 NOTE — Assessment & Plan Note (Signed)
Allegra plus fluticasone nasal spray

## 2015-04-12 NOTE — Assessment & Plan Note (Signed)
Continue Pulmicort on current schedule. Albuterol when necessary

## 2015-04-12 NOTE — Patient Instructions (Addendum)
Please take Augmentin 875mg  twice a day  Continue your pulmicort, allegra, fluticasone nasal spray Please call our office if you are not improving in the next week Follow with Dr Lamonte Sakai in 6 months or sooner if you have any problems

## 2015-04-12 NOTE — Progress Notes (Signed)
   Subjective:    Patient ID: [name], female    DOB: 1945-05-21, 70 y.o.   MRN: [id number]  Asthma Her past medical history is significant for asthma.   70 year old woman who has been followed by Dr. Joya Gaskins in our office for allergic rhinitis, mild persistent asthma, some degree of upper airway irritation syndrome based on her office notes. She underwent a right hip replacement in 07/2014 with Dr Jerilynn Mages. Alvan Dame.  She unfortunately just started to get URI sx, exposed through her grandchild. She is having some am cough, bothers her most during the Fall, some hoarse voice. She is on allegra, uses flonase only prn. She is maintained on pulmicort qd. She remains active. No flares. She is on mucinex right now.   She has hx ? RA followed by Dr Ouida Sills, was treated for 2 yrs w MTX, now off of it.   ROV 01/11/15 -- follow-up visit for allergic rhinitis cough, upper airway irritation, mild persistent asthma. Also carries a diagnosis of seronegative rheumatoid arthritis. She has been on methotrexate in the past but not currently.  She has been using Pulmicort. She has had cyclical episodes of cough, some rhinitis, UA noise. Also w HA's. Can be bad for a week, then better. Has used mucinex with some improvement. She is on allegra, does not use her fluticasone because it makes her lose her smell.  She would like to avoid if possible. She was treated with augmentin and pred taper by her PCP in October.   ROV 04/12/15 -- Patient follows up for upper airway irritation and mild persistent asthma in the setting of allergic rhinitis. She has a history of chronic cough. She also carries a history of seronegative rheumatoid arthritis.  She returns today reporting that she has had URI sx about 10 days ago, had fever, cough, sneezing. Her Flu test was negative 8 day ago. She rested, took fluids, was starting to feel better. Now having body aches, chills, itching eyes. More cough, now prod of yellow. Yellow green from her  sinuses, having HA's. She is on her allegra, fluticasone NS. Still on pulmicort qd, has not taken albuterol.    Review of Systems As per HPI     Objective:   Physical Exam  Filed Vitals:   04/12/15 1350  BP: 122/82  Pulse: 80  Weight: 183 lb (83.008 kg)  SpO2: 98%   Gen: Pleasant, well-nourished, in no distress,  normal affect  ENT: No lesions,  mouth clear,  oropharynx clear, no postnasal drip  Neck: No JVD, no TMG, no carotid bruits  Lungs: No use of accessory muscles, clear without rales or rhonchi  Cardiovascular: RRR, heart sounds normal,  no peripheral edema  Musculoskeletal: No deformities, no cyanosis or clubbing  Neuro: alert, non focal  Skin: Warm, no lesions or rashes      Assessment & Plan:  Acute sinusitis Following recent upper respiratory infection. We will treat this with Augmentin, continue her allergy regimen, continue Pulmicort  Asthma with allergic rhinitis Continue Pulmicort on current schedule. Albuterol when necessary  Allergic rhinitis Allegra plus fluticasone nasal spray   Baltazar Apo, MD, PhD 04/12/2015, 3:50 PM [facility] [phone] or if no answer [phone]

## 2015-04-13 ENCOUNTER — Telehealth: Payer: Self-pay | Admitting: Emergency Medicine

## 2015-04-13 NOTE — Telephone Encounter (Signed)
Attempted to call pt. Line was busy. Will try back. 

## 2015-04-14 MED ORDER — ALBUTEROL SULFATE HFA 108 (90 BASE) MCG/ACT IN AERS: 2.0000 | INHALATION_SPRAY | Freq: Four times a day (QID) | RESPIRATORY_TRACT | Status: DC | PRN

## 2015-04-14 NOTE — Telephone Encounter (Signed)
Spoke with pt. She needs a refill on albuterol hfa. This has been sent in. Nothing further was needed.

## 2015-04-19 DIAGNOSIS — F422 Mixed obsessional thoughts and acts: Secondary | ICD-10-CM | POA: Diagnosis not present

## 2015-04-19 DIAGNOSIS — F410 Panic disorder [episodic paroxysmal anxiety] without agoraphobia: Secondary | ICD-10-CM | POA: Diagnosis not present

## 2015-04-19 DIAGNOSIS — F902 Attention-deficit hyperactivity disorder, combined type: Secondary | ICD-10-CM | POA: Diagnosis not present

## 2015-04-20 ENCOUNTER — Other Ambulatory Visit: Payer: Self-pay | Admitting: Family Medicine

## 2015-05-05 ENCOUNTER — Encounter: Payer: Self-pay | Admitting: Family Medicine

## 2015-05-05 ENCOUNTER — Ambulatory Visit (INDEPENDENT_AMBULATORY_CARE_PROVIDER_SITE_OTHER): Payer: Medicare Other

## 2015-05-05 ENCOUNTER — Ambulatory Visit (INDEPENDENT_AMBULATORY_CARE_PROVIDER_SITE_OTHER): Payer: Medicare Other | Admitting: Family Medicine

## 2015-05-05 VITALS — BP 135/74 | HR 75 | Temp 97.9°F | Wt 183.0 lb

## 2015-05-05 DIAGNOSIS — R05 Cough: Secondary | ICD-10-CM | POA: Diagnosis not present

## 2015-05-05 DIAGNOSIS — R509 Fever, unspecified: Secondary | ICD-10-CM | POA: Diagnosis not present

## 2015-05-05 DIAGNOSIS — J069 Acute upper respiratory infection, unspecified: Secondary | ICD-10-CM | POA: Diagnosis not present

## 2015-05-05 MED ORDER — IPRATROPIUM BROMIDE 0.06 % NA SOLN: 2.0000 | NASAL | Status: DC | PRN

## 2015-05-05 MED ORDER — PREDNISONE 10 MG PO TABS: 30.0000 mg | ORAL_TABLET | Freq: Every day | ORAL | Status: DC

## 2015-05-05 NOTE — Patient Instructions (Signed)
Thank you for coming in today. Use the albuterol as needed. Use atrovent nasal spray.  Get xray .  Return if not better.  Take prednisone.  Call or go to the emergency room if you get worse, have trouble breathing, have chest pains, or palpitations.   Upper Respiratory Infection, Adult Most upper respiratory infections (URIs) are a viral infection of the air passages leading to the lungs. A URI affects the nose, throat, and upper air passages. The most common type of URI is nasopharyngitis and is typically referred to as "the common cold." URIs run their course and usually go away on their own. Most of the time, a URI does not require medical attention, but sometimes a bacterial infection in the upper airways can follow a viral infection. This is called a secondary infection. Sinus and middle ear infections are common types of secondary upper respiratory infections. Bacterial pneumonia can also complicate a URI. A URI can worsen asthma and chronic obstructive pulmonary disease (COPD). Sometimes, these complications can require emergency medical care and may be life threatening.  CAUSES Almost all URIs are caused by viruses. A virus is a type of germ and can spread from one person to another.  RISKS FACTORS You may be at risk for a URI if:   You smoke.   You have chronic heart or lung disease.  You have a weakened defense (immune) system.   You are very young or very old.   You have nasal allergies or asthma.  You work in crowded or poorly ventilated areas.  You work in health care facilities or schools. SIGNS AND SYMPTOMS  Symptoms typically develop 2-3 days after you come in contact with a cold virus. Most viral URIs last 7-10 days. However, viral URIs from the influenza virus (flu virus) can last 14-18 days and are typically more severe. Symptoms may include:   Runny or stuffy (congested) nose.   Sneezing.   Cough.   Sore throat.   Headache.   Fatigue.   Fever.    Loss of appetite.   Pain in your forehead, behind your eyes, and over your cheekbones (sinus pain).  Muscle aches.  DIAGNOSIS  Your health care provider may diagnose a URI by:  Physical exam.  Tests to check that your symptoms are not due to another condition such as:  Strep throat.  Sinusitis.  Pneumonia.  Asthma. TREATMENT  A URI goes away on its own with time. It cannot be cured with medicines, but medicines may be prescribed or recommended to relieve symptoms. Medicines may help:  Reduce your fever.  Reduce your cough.  Relieve nasal congestion. HOME CARE INSTRUCTIONS   Take medicines only as directed by your health care provider.   Gargle warm saltwater or take cough drops to comfort your throat as directed by your health care provider.  Use a warm mist humidifier or inhale steam from a shower to increase air moisture. This may make it easier to breathe.  Drink enough fluid to keep your urine clear or pale yellow.   Eat soups and other clear broths and maintain good nutrition.   Rest as needed.   Return to work when your temperature has returned to normal or as your health care provider advises. You may need to stay home longer to avoid infecting others. You can also use a face mask and careful hand washing to prevent spread of the virus.  Increase the usage of your inhaler if you have asthma.   Do not  use any tobacco products, including cigarettes, chewing tobacco, or electronic cigarettes. If you need help quitting, ask your health care provider. PREVENTION  The best way to protect yourself from getting a cold is to practice good hygiene.   Avoid oral or hand contact with people with cold symptoms.   Wash your hands often if contact occurs.  There is no clear evidence that vitamin C, vitamin E, echinacea, or exercise reduces the chance of developing a cold. However, it is always recommended to get plenty of rest, exercise, and practice good  nutrition.  SEEK MEDICAL CARE IF:   You are getting worse rather than better.   Your symptoms are not controlled by medicine.   You have chills.  You have worsening shortness of breath.  You have brown or red mucus.  You have yellow or brown nasal discharge.  You have pain in your face, especially when you bend forward.  You have a fever.  You have swollen neck glands.  You have pain while swallowing.  You have white areas in the back of your throat. SEEK IMMEDIATE MEDICAL CARE IF:   You have severe or persistent:  Headache.  Ear pain.  Sinus pain.  Chest pain.  You have chronic lung disease and any of the following:  Wheezing.  Prolonged cough.  Coughing up blood.  A change in your usual mucus.  You have a stiff neck.  You have changes in your:  Vision.  Hearing.  Thinking.  Mood. MAKE SURE YOU:   Understand these instructions.  Will watch your condition.  Will get help right away if you are not doing well or get worse.   This information is not intended to replace advice given to you by your health care provider. Make sure you discuss any questions you have with your health care provider.   Document Released: 07/25/2000 Document Revised: 06/15/2014 Document Reviewed: 05/06/2013 Elsevier Interactive Patient Education [employer].

## 2015-05-05 NOTE — Progress Notes (Signed)
[name] is a 70 y.o. female who presents to [facility] today for off and wheezing. Symptoms present for the last week or so. Symptoms worsened recently. Patient denies any severe sinus pain or pressure. She notes the cough is occasionally productive. She's tried some over-the-counter medicines that have helped. She has not used her albuterol inhaler yet. She notes that she has frequent episodes of sinus infections and has been on antibiotics frequently.   Past Medical History  Diagnosis Date  . Other and unspecified hyperlipidemia   . Unspecified arthropathy, hand   . Peptic ulcer, unspecified site, unspecified as acute or chronic, without mention of hemorrhage, perforation, or obstruction   . Allergic rhinitis, cause unspecified   . Benign paroxysmal positional vertigo   . Depressive disorder, not elsewhere classified   . Esophageal reflux   . Unspecified hypothyroidism   . Localized osteoarthrosis not specified whether primary or secondary, lower leg   . Other diseases of vocal cords   . Hypertension   . Shortness of breath dyspnea     with anxiety; with climbing stairs  . Unspecified asthma(493.90)     triggered with a virus   . Pneumonia     bilat pneumonia 1987  . Anxiety   . History of hiatal hernia   . Cancer ([place])     basal cell carcinoma per right side of nostril  . TMJ (temporomandibular joint disorder)   . Diabetes mellitus without complication ([place])     noted per H&P with Dr Dianah Field 08/03/2014    Past Surgical History  Procedure Laterality Date  . Nasal sinus surgery    . Knee arthroscopy      bilat   . Rotator cuff repair      right   . Thumb surgery       left hand 45 years ago   . Breast surgery      LUMPECTOMY / LEFT 10/12/2011  . Total hip arthroplasty Right 08/10/2014    Procedure: RIGHT TOTAL HIP ARTHROPLASTY ANTERIOR APPROACH;  Surgeon:  Paralee Cancel, MD;  Location: [facility];  Service: Orthopedics;  Laterality: Right;   Social History  Substance Use Topics  . Smoking status: Former Smoker -- 0.30 packs/day for 2 years    Types: Cigarettes    Quit date: 02/12/1958  . Smokeless tobacco: Never Used  . Alcohol Use: Yes     Comment: wine socially   family history includes Asthma in her brother; Colon cancer in her mother; Heart disease in her father; Liver cancer in her brother.  ROS as above Medications: Current Outpatient Prescriptions  Medication Sig Dispense Refill  . albuterol (PROVENTIL HFA) 108 (90 Base) MCG/ACT inhaler Inhale 2 puffs into the lungs every 6 (six) hours as needed for shortness of breath. 6.7 g 4  . Biotin (BIOTIN 5000) 5 MG CAPS Take 1 capsule by mouth daily.      . budesonide (PULMICORT FLEXHALER) 180 MCG/ACT inhaler INHALE 2 PUFFS INTO THE LUNGS DAILY. 1 each 5  . buPROPion (WELLBUTRIN XL) 150 MG 24 hr tablet Take 150 mg by mouth daily.      . Calcium Carbonate-Vit D-Min 600-400 MG-UNIT TABS Take 1 tablet by mouth 2 (two) times daily.     . Cholecalciferol (VITAMIN D3) 2000 UNITS TABS Take 2,000 Units by mouth daily.     . clindamycin (CLEOCIN T) 1 % lotion Apply 1 application topically 2 (two) times daily as needed (bumps).     Marland Kitchen  clonazePAM (KLONOPIN) 0.5 MG tablet Take 0.125-0.25 mg by mouth daily as needed for anxiety.     . fexofenadine (ALLEGRA) 180 MG tablet Take 180 mg by mouth at bedtime.    . fluticasone (FLONASE) 50 MCG/ACT nasal spray Place 2 sprays into both nostrils daily as needed for allergies or rhinitis.    . folic acid (FOLVITE) 1 MG tablet Take 1 mg by mouth 2 (two) times daily.      . Ginkgo Biloba 40 MG TABS Take 1 tablet by mouth 3 (three) times daily.     Marland Kitchen levothyroxine (SYNTHROID, LEVOTHROID) 75 MCG tablet Take 75 mcg by mouth daily. Except Sunday 88 mcg    . levothyroxine (SYNTHROID, LEVOTHROID) 88 MCG tablet Take only sunday     . losartan-hydrochlorothiazide (HYZAAR) 100-25  MG tablet Take 1 tablet by mouth daily. NEED FOLLOW UP APPOINTMENT FOR MORE REFILLS 90 tablet 0  . Lysine 500 MG TABS Take 1 tablet by mouth daily.    . meloxicam (MOBIC) 15 MG tablet TAKE 1 TABLET BY MOUTH EVERY MORNING 90 tablet 1  . minoxidil (ROGAINE) 2 % external solution Apply 1 application topically 2 (two) times daily.    . Multiple Vitamins-Minerals (CENTRUM SILVER ULTRA WOMENS PO) Take 1 capsule by mouth daily.    Marland Kitchen omeprazole-sodium bicarbonate (ZEGERID) 40-1100 MG per capsule Take 1 capsule by mouth daily before breakfast.      . PARoxetine (PAXIL-CR) 12.5 MG 24 hr tablet Take 12.5 mg by mouth 2 (two) times daily.     . Saline (SIMPLY SALINE) 0.9 % AERS Place 1 spray into the nose 2 (two) times daily as needed (dry).    Marland Kitchen ipratropium (ATROVENT) 0.06 % nasal spray Place 2 sprays into both nostrils every 4 (four) hours as needed for rhinitis. 10 mL 6  . predniSONE (DELTASONE) 10 MG tablet Take 3 tablets (30 mg total) by mouth daily with breakfast. 15 tablet 0   No current facility-administered medications for this visit.   Allergies  Allergen Reactions  . Astelin [Azelastine Hcl] Other (See Comments)    Headache  . Lisinopril     ACE cough  . Morphine     REACTION: Nausea  . Moxifloxacin     REACTION: confusion, dizziness, paranoia     Exam:  BP 135/74 mmHg  Pulse 75  Temp(Src) 97.9 F (36.6 C) (Oral)  Wt 183 lb (83.008 kg)  SpO2 97% Gen: Well NAD HEENT: EOMI,  MMM Clear nasal discharge. Posterior pharynx with cobblestoning. Normal tympanic membranes bilaterally. Maxillary and frontal sinuses are not particularly tender to percussion. Lungs: Normal work of breathing. CTABL Heart: RRR no MRG Abd: NABS, Soft. Nondistended, Nontender Exts: Brisk capillary refill, warm and well perfused.   No results found for this or any previous visit (from the past 24 hour(s)). No results found.   70 year old woman with viral URI. Empiric treatment with prednisone and Atrovent  nasal spray. Obtain chest x-ray. Treat with antibiotics if not better. Return as needed.

## 2015-05-06 NOTE — Progress Notes (Signed)
Quick Note:  Normal, no changes. ______ 

## 2015-05-09 NOTE — Progress Notes (Signed)
Green snot is typical of both viral and bacterial sinus infection.

## 2015-06-09 DIAGNOSIS — E7800 Pure hypercholesterolemia, unspecified: Secondary | ICD-10-CM | POA: Diagnosis not present

## 2015-06-09 DIAGNOSIS — R7301 Impaired fasting glucose: Secondary | ICD-10-CM | POA: Diagnosis not present

## 2015-06-09 DIAGNOSIS — E559 Vitamin D deficiency, unspecified: Secondary | ICD-10-CM | POA: Diagnosis not present

## 2015-06-09 DIAGNOSIS — R5382 Chronic fatigue, unspecified: Secondary | ICD-10-CM | POA: Diagnosis not present

## 2015-06-09 DIAGNOSIS — E038 Other specified hypothyroidism: Secondary | ICD-10-CM | POA: Diagnosis not present

## 2015-06-14 ENCOUNTER — Other Ambulatory Visit: Payer: Self-pay | Admitting: Family Medicine

## 2015-06-14 DIAGNOSIS — E7800 Pure hypercholesterolemia, unspecified: Secondary | ICD-10-CM | POA: Diagnosis not present

## 2015-06-14 DIAGNOSIS — E559 Vitamin D deficiency, unspecified: Secondary | ICD-10-CM | POA: Diagnosis not present

## 2015-06-14 DIAGNOSIS — R5382 Chronic fatigue, unspecified: Secondary | ICD-10-CM | POA: Diagnosis not present

## 2015-06-14 DIAGNOSIS — L659 Nonscarring hair loss, unspecified: Secondary | ICD-10-CM | POA: Diagnosis not present

## 2015-06-14 DIAGNOSIS — F902 Attention-deficit hyperactivity disorder, combined type: Secondary | ICD-10-CM | POA: Diagnosis not present

## 2015-06-14 DIAGNOSIS — F422 Mixed obsessional thoughts and acts: Secondary | ICD-10-CM | POA: Diagnosis not present

## 2015-06-14 DIAGNOSIS — E038 Other specified hypothyroidism: Secondary | ICD-10-CM | POA: Diagnosis not present

## 2015-06-14 DIAGNOSIS — F410 Panic disorder [episodic paroxysmal anxiety] without agoraphobia: Secondary | ICD-10-CM | POA: Diagnosis not present

## 2015-06-14 DIAGNOSIS — R7301 Impaired fasting glucose: Secondary | ICD-10-CM | POA: Diagnosis not present

## 2015-07-06 ENCOUNTER — Encounter: Payer: Self-pay | Admitting: Physician Assistant

## 2015-07-06 ENCOUNTER — Ambulatory Visit (INDEPENDENT_AMBULATORY_CARE_PROVIDER_SITE_OTHER): Payer: Medicare Other | Admitting: Physician Assistant

## 2015-07-06 VITALS — BP 145/65 | HR 90 | Temp 99.2°F | Ht 66.0 in | Wt 184.0 lb

## 2015-07-06 DIAGNOSIS — J069 Acute upper respiratory infection, unspecified: Secondary | ICD-10-CM | POA: Diagnosis not present

## 2015-07-06 MED ORDER — AMOXICILLIN-POT CLAVULANATE 875-125 MG PO TABS: 1.0000 | ORAL_TABLET | Freq: Two times a day (BID) | ORAL | Status: DC

## 2015-07-06 MED ORDER — PREDNISONE 10 MG PO TABS: 30.0000 mg | ORAL_TABLET | Freq: Every day | ORAL | Status: DC

## 2015-07-06 NOTE — Progress Notes (Signed)
   Subjective:    Patient ID: [name], female    DOB: 1945-12-25, 70 y.o.   MRN: [id number]  HPI  Pt presents to the clinic with 3 days of scratchy throat, sinus pressure, ear popping, loosing voice. she just got home for a trip to [place]. She has a cough but not productive. She is taking mucinex and sucking on cold eeze. She feels like has run a fever a few times. She has had some hot flashes. She did not like atrovent because ran down back of throat. flonase works better but has not used. She is weak and tired. No SOB. She feels like she has been wheezing.     Review of Systems     Objective:   Physical Exam  Constitutional: She is oriented to person, place, and time. She appears well-developed and well-nourished.  HENT:  Head: Normocephalic and atraumatic.  Right Ear: External ear normal.  Left Ear: External ear normal.  Mouth/Throat: Oropharynx is clear and moist. No oropharyngeal exudate.  TM's clear bilaterally.  No tenderness over maxillary or frontal sinuses.  Nasal turbinates red and swollen.   Eyes: Conjunctivae are normal. Right eye exhibits no discharge. Left eye exhibits no discharge.  Neck: Normal range of motion. Neck supple.  Cardiovascular: Normal rate, regular rhythm and normal heart sounds.   Pulmonary/Chest: Effort normal and breath sounds normal.  Few scattered wheezing at base of left lung.   Lymphadenopathy:    She has no cervical adenopathy.  Neurological: She is alert and oriented to person, place, and time.  Skin: Skin is dry.  Psychiatric: She has a normal mood and affect.          Assessment & Plan:  Viral URI- discussed symptoms consisent with viral etiology. Start prednisone. If not improving in 2-3 days can start augmentin. Continue mucinex and flonase. HO given.

## 2015-07-06 NOTE — Patient Instructions (Signed)
Upper Respiratory Infection, Adult Most upper respiratory infections (URIs) are a viral infection of the air passages leading to the lungs. A URI affects the nose, throat, and upper air passages. The most common type of URI is nasopharyngitis and is typically referred to as "the common cold." URIs run their course and usually go away on their own. Most of the time, a URI does not require medical attention, but sometimes a bacterial infection in the upper airways can follow a viral infection. This is called a secondary infection. Sinus and middle ear infections are common types of secondary upper respiratory infections. Bacterial pneumonia can also complicate a URI. A URI can worsen asthma and chronic obstructive pulmonary disease (COPD). Sometimes, these complications can require emergency medical care and may be life threatening.  CAUSES Almost all URIs are caused by viruses. A virus is a type of germ and can spread from one person to another.  RISKS FACTORS You may be at risk for a URI if:   You smoke.   You have chronic heart or lung disease.  You have a weakened defense (immune) system.   You are very young or very old.   You have nasal allergies or asthma.  You work in crowded or poorly ventilated areas.  You work in health care facilities or schools. SIGNS AND SYMPTOMS  Symptoms typically develop 2-3 days after you come in contact with a cold virus. Most viral URIs last 7-10 days. However, viral URIs from the influenza virus (flu virus) can last 14-18 days and are typically more severe. Symptoms may include:   Runny or stuffy (congested) nose.   Sneezing.   Cough.   Sore throat.   Headache.   Fatigue.   Fever.   Loss of appetite.   Pain in your forehead, behind your eyes, and over your cheekbones (sinus pain).  Muscle aches.  DIAGNOSIS  Your health care provider may diagnose a URI by:  Physical exam.  Tests to check that your symptoms are not due to  another condition such as:  Strep throat.  Sinusitis.  Pneumonia.  Asthma. TREATMENT  A URI goes away on its own with time. It cannot be cured with medicines, but medicines may be prescribed or recommended to relieve symptoms. Medicines may help:  Reduce your fever.  Reduce your cough.  Relieve nasal congestion. HOME CARE INSTRUCTIONS   Take medicines only as directed by your health care provider.   Gargle warm saltwater or take cough drops to comfort your throat as directed by your health care provider.  Use a warm mist humidifier or inhale steam from a shower to increase air moisture. This may make it easier to breathe.  Drink enough fluid to keep your urine clear or pale yellow.   Eat soups and other clear broths and maintain good nutrition.   Rest as needed.   Return to work when your temperature has returned to normal or as your health care provider advises. You may need to stay home longer to avoid infecting others. You can also use a face mask and careful hand washing to prevent spread of the virus.  Increase the usage of your inhaler if you have asthma.   Do not use any tobacco products, including cigarettes, chewing tobacco, or electronic cigarettes. If you need help quitting, ask your health care provider. PREVENTION  The best way to protect yourself from getting a cold is to practice good hygiene.   Avoid oral or hand contact with people with cold   symptoms.   Wash your hands often if contact occurs.  There is no clear evidence that vitamin C, vitamin E, echinacea, or exercise reduces the chance of developing a cold. However, it is always recommended to get plenty of rest, exercise, and practice good nutrition.  SEEK MEDICAL CARE IF:   You are getting worse rather than better.   Your symptoms are not controlled by medicine.   You have chills.  You have worsening shortness of breath.  You have brown or red mucus.  You have yellow or brown nasal  discharge.  You have pain in your face, especially when you bend forward.  You have a fever.  You have swollen neck glands.  You have pain while swallowing.  You have white areas in the back of your throat. SEEK IMMEDIATE MEDICAL CARE IF:   You have severe or persistent:  Headache.  Ear pain.  Sinus pain.  Chest pain.  You have chronic lung disease and any of the following:  Wheezing.  Prolonged cough.  Coughing up blood.  A change in your usual mucus.  You have a stiff neck.  You have changes in your:  Vision.  Hearing.  Thinking.  Mood. MAKE SURE YOU:   Understand these instructions.  Will watch your condition.  Will get help right away if you are not doing well or get worse.   This information is not intended to replace advice given to you by your health care provider. Make sure you discuss any questions you have with your health care provider.   Document Released: 07/25/2000 Document Revised: 06/15/2014 Document Reviewed: 05/06/2013 Elsevier Interactive Patient Education 2016 [employer]  

## 2015-07-08 ENCOUNTER — Telehealth: Payer: Self-pay

## 2015-07-08 NOTE — Telephone Encounter (Signed)
Yes go ahead and start.

## 2015-07-08 NOTE — Telephone Encounter (Signed)
Notified patient that she should start her medication.

## 2015-08-02 DIAGNOSIS — F422 Mixed obsessional thoughts and acts: Secondary | ICD-10-CM | POA: Diagnosis not present

## 2015-08-02 DIAGNOSIS — F902 Attention-deficit hyperactivity disorder, combined type: Secondary | ICD-10-CM | POA: Diagnosis not present

## 2015-08-02 DIAGNOSIS — F410 Panic disorder [episodic paroxysmal anxiety] without agoraphobia: Secondary | ICD-10-CM | POA: Diagnosis not present

## 2015-08-23 DIAGNOSIS — F902 Attention-deficit hyperactivity disorder, combined type: Secondary | ICD-10-CM | POA: Diagnosis not present

## 2015-08-23 DIAGNOSIS — F422 Mixed obsessional thoughts and acts: Secondary | ICD-10-CM | POA: Diagnosis not present

## 2015-08-23 DIAGNOSIS — F410 Panic disorder [episodic paroxysmal anxiety] without agoraphobia: Secondary | ICD-10-CM | POA: Diagnosis not present

## 2015-08-30 ENCOUNTER — Other Ambulatory Visit: Payer: Self-pay | Admitting: Emergency Medicine

## 2015-09-07 DIAGNOSIS — Z96641 Presence of right artificial hip joint: Secondary | ICD-10-CM | POA: Diagnosis not present

## 2015-09-07 DIAGNOSIS — Z471 Aftercare following joint replacement surgery: Secondary | ICD-10-CM | POA: Diagnosis not present

## 2015-09-13 ENCOUNTER — Other Ambulatory Visit: Payer: Self-pay | Admitting: Family Medicine

## 2015-09-13 DIAGNOSIS — R7301 Impaired fasting glucose: Secondary | ICD-10-CM | POA: Diagnosis not present

## 2015-09-13 DIAGNOSIS — E7800 Pure hypercholesterolemia, unspecified: Secondary | ICD-10-CM | POA: Diagnosis not present

## 2015-09-13 DIAGNOSIS — R5382 Chronic fatigue, unspecified: Secondary | ICD-10-CM | POA: Diagnosis not present

## 2015-09-13 DIAGNOSIS — E038 Other specified hypothyroidism: Secondary | ICD-10-CM | POA: Diagnosis not present

## 2015-09-13 NOTE — Progress Notes (Signed)
HPI The patient presents for evaluation of chest pain.  I saw her last in 2015.  She had dyspnea and chest pain.  However, echocardiogram was unremarkable. There is no evidence of ischemia on stress perfusion study. BNP was 21. She's also had a negative d-dimer.   She returns for follow-up because that pain that she was having is getting worse. She's having it very much at night. She doesn't think it's like her previous reflux. It is sharp and can go to become dull. It is 8 out of 10 in intensity. It might last for 10-30 minutes. She might have it periodically through the day. She does do walking at the gym and she doesn't bring it on with that. She has continued dyspnea with exertion which seems to be chronic. She does not describe associated symptoms with her discomfort. She doesn't get acutely short of breath. She doesn't have nausea vomiting or diaphoresis. She doesn't have any radiation of discomfort to her jaw or to her arms. She has no palpitations, presyncope or syncope.  She reports a great deal of emotional stress and reports that her life is a game of "Beat the Clock.  She has gained about 30 lbs in the past couple of years.   Allergies  Allergen Reactions  . Astelin [Azelastine Hcl] Other (See Comments)    Headache  . Lisinopril     ACE cough  . Morphine     REACTION: Nausea  . Moxifloxacin     REACTION: confusion, dizziness, paranoia    Current Outpatient Prescriptions  Medication Sig Dispense Refill  . albuterol (PROVENTIL HFA) 108 (90 Base) MCG/ACT inhaler Inhale 2 puffs into the lungs every 6 (six) hours as needed for shortness of breath. 6.7 g 4  . Biotin (BIOTIN 5000) 5 MG CAPS Take 1 capsule by mouth daily.      Marland Kitchen buPROPion (WELLBUTRIN XL) 150 MG 24 hr tablet Take 150 mg by mouth daily.      . Calcium Carbonate-Vit D-Min 600-400 MG-UNIT TABS Take 1 tablet by mouth 2 (two) times daily.     . Cholecalciferol (VITAMIN D3) 2000 UNITS TABS Take 2,000 Units by mouth daily.       . clindamycin (CLEOCIN T) 1 % lotion Apply 1 application topically 2 (two) times daily as needed (bumps).     . clonazePAM (KLONOPIN) 0.5 MG tablet Take 0.125-0.25 mg by mouth daily as needed for anxiety.     . fexofenadine (ALLEGRA) 180 MG tablet Take 180 mg by mouth at bedtime.    . fluticasone (FLONASE) 50 MCG/ACT nasal spray Place 2 sprays into both nostrils daily as needed for allergies or rhinitis.    . folic acid (FOLVITE) 1 MG tablet Take 1 mg by mouth 2 (two) times daily.      . Ginkgo Biloba 40 MG TABS Take 1 tablet by mouth 3 (three) times daily.     Marland Kitchen levothyroxine (SYNTHROID, LEVOTHROID) 75 MCG tablet Take 75 mcg by mouth daily. Except Sunday 88 mcg    . levothyroxine (SYNTHROID, LEVOTHROID) 88 MCG tablet Take only sunday     . losartan-hydrochlorothiazide (HYZAAR) 100-25 MG tablet TAKE 1 TABLET BY MOUTH DAILY. NEED FOLLOW UP APPOINTMENT FOR MORE REFILLS 90 tablet 0  . Lysine 500 MG TABS Take 1 tablet by mouth daily.    . meloxicam (MOBIC) 15 MG tablet TAKE 1 TABLET BY MOUTH EVERY MORNING 90 tablet 1  . minoxidil (ROGAINE) 2 % external solution Apply 1 application topically  2 (two) times daily.    . Multiple Vitamins-Minerals (CENTRUM SILVER ULTRA WOMENS PO) Take 1 capsule by mouth daily.    Marland Kitchen omeprazole-sodium bicarbonate (ZEGERID) 40-1100 MG per capsule Take 1 capsule by mouth daily before breakfast.      . PARoxetine (PAXIL-CR) 12.5 MG 24 hr tablet Take 12.5 mg by mouth 2 (two) times daily.     Marland Kitchen PULMICORT FLEXHALER 180 MCG/ACT inhaler INHALE 2 PUFFS INTO THE LUNGS DAILY. 1 each 5  . Saline (SIMPLY SALINE) 0.9 % AERS Place 1 spray into the nose 2 (two) times daily as needed (dry).     No current facility-administered medications for this visit.     Past Medical History:  Diagnosis Date  . Allergic rhinitis, cause unspecified   . Anxiety   . Benign paroxysmal positional vertigo   . Cancer ([place])    basal cell carcinoma per right side of nostril  . Depressive disorder, not  elsewhere classified   . Diabetes mellitus without complication ([place])    noted per H&P with Dr Dianah Field 08/03/2014   . Esophageal reflux   . History of hiatal hernia   . Hypertension   . Localized osteoarthrosis not specified whether primary or secondary, lower leg   . Other and unspecified hyperlipidemia   . Other diseases of vocal cords   . Peptic ulcer, unspecified site, unspecified as acute or chronic, without mention of hemorrhage, perforation, or obstruction   . Pneumonia    bilat pneumonia 1987  . Shortness of breath dyspnea    with anxiety; with climbing stairs  . TMJ (temporomandibular joint disorder)   . Unspecified arthropathy, hand   . Unspecified asthma(493.90)    triggered with a virus   . Unspecified hypothyroidism     Past Surgical History:  Procedure Laterality Date  . BREAST SURGERY     LUMPECTOMY / LEFT 10/12/2011  . KNEE ARTHROSCOPY     bilat   . NASAL SINUS SURGERY    . ROTATOR CUFF REPAIR     right   . thumb surgery      left hand 45 years ago   . TOTAL HIP ARTHROPLASTY Right 08/10/2014   Procedure: RIGHT TOTAL HIP ARTHROPLASTY ANTERIOR APPROACH;  Surgeon: Paralee Cancel, MD;  Location: [facility];  Service: Orthopedics;  Laterality: Right;    ROS:  As stated in the HPI and negative for all other systems.  PHYSICAL EXAM BP 138/84   Pulse 80   Ht 5\' 3"  (1.6 m)   Wt 183 lb (83 kg)   BMI 32.42 kg/m  GENERAL:  Well appearing HEENT:  Pupils equal round and reactive, fundi not visualized, oral mucosa unremarkable NECK:  No jugular venous distention, waveform within normal limits, carotid upstroke brisk and symmetric, no bruits, no thyromegaly LYMPHATICS:  No cervical, inguinal adenopathy LUNGS:  Clear to auscultation bilaterally BACK:  No CVA tenderness CHEST:  Unremarkable HEART:  PMI not displaced or sustained,S1 and S2 within normal limits, no S3, no S4, no clicks, no rubs, no murmurs ABD:  Flat, positive bowel sounds normal in frequency in pitch, no  bruits, no rebound, no guarding, no midline pulsatile mass, no hepatomegaly, no splenomegaly, mild tenderness in the right upper quadrant.   EXT:  2 plus pulses throughout, no edema, no cyanosis no clubbing SKIN:  No rashes no nodules NEURO:  Cranial nerves II through XII grossly intact, motor grossly intact throughout PSYCH:  Cognitively intact, oriented to person place and time  EKG:  Sinus rhythm,  rate 80, left axis deviation, left atrial enlargement, RSR prime V1 and V2, no change from previous.  09/15/2015   ASSESSMENT AND PLAN   CHEST PAIN:   She does have cardiovascular risk factors.  At this point I think the pretest probability of obstructive coronary disease is somewhat low. She needs stress testing but would not be a walk on a treadmill with her dyspnea. I will repeat a Lexiscan Myoview.  However, if this is normal I have asked her to follow-up with GI as this certainly could be a gallbladder issue or other GI etiology.  DYSPNEA:  She has had an extensive workup in the past. I don't suspect any heart failure. This will be evaluated as above.  ANXIETY:  She has a great stress and anxiety we discussed this. Discussed exercises with therapy for this.

## 2015-09-15 ENCOUNTER — Encounter: Payer: Self-pay | Admitting: Cardiology

## 2015-09-15 ENCOUNTER — Ambulatory Visit (INDEPENDENT_AMBULATORY_CARE_PROVIDER_SITE_OTHER): Payer: Medicare Other | Admitting: Cardiology

## 2015-09-15 VITALS — BP 138/84 | HR 80 | Ht 63.0 in | Wt 183.0 lb

## 2015-09-15 DIAGNOSIS — R072 Precordial pain: Secondary | ICD-10-CM | POA: Diagnosis not present

## 2015-09-15 NOTE — Patient Instructions (Signed)
Medication Instructions:  Continue current medications  Labwork: NONE  Testing/Procedures: Your physician has requested that you have a lexiscan myoview. For further information please visit [email or web address]. Please follow instruction sheet, as given.   Follow-Up: Your physician recommends that you schedule a follow-up appointment in: As Needed   Any Other Special Instructions Will Be Listed Below (If Applicable).     If you need a refill on your cardiac medications before your next appointment, please call your pharmacy.

## 2015-09-20 DIAGNOSIS — L659 Nonscarring hair loss, unspecified: Secondary | ICD-10-CM | POA: Diagnosis not present

## 2015-09-20 DIAGNOSIS — R7301 Impaired fasting glucose: Secondary | ICD-10-CM | POA: Diagnosis not present

## 2015-09-20 DIAGNOSIS — E038 Other specified hypothyroidism: Secondary | ICD-10-CM | POA: Diagnosis not present

## 2015-09-20 DIAGNOSIS — E7800 Pure hypercholesterolemia, unspecified: Secondary | ICD-10-CM | POA: Diagnosis not present

## 2015-09-20 DIAGNOSIS — E559 Vitamin D deficiency, unspecified: Secondary | ICD-10-CM | POA: Diagnosis not present

## 2015-09-20 DIAGNOSIS — R5382 Chronic fatigue, unspecified: Secondary | ICD-10-CM | POA: Diagnosis not present

## 2015-09-22 NOTE — Addendum Note (Signed)
Addended by: Cristopher Estimable on: 09/22/2015 05:55 PM   Modules accepted: Orders

## 2015-09-27 ENCOUNTER — Telehealth (HOSPITAL_COMMUNITY): Payer: Self-pay

## 2015-09-27 NOTE — Telephone Encounter (Signed)
Encounter complete. 

## 2015-09-28 ENCOUNTER — Telehealth (HOSPITAL_COMMUNITY): Payer: Self-pay

## 2015-09-28 NOTE — Telephone Encounter (Signed)
Encounter complete. 

## 2015-09-29 ENCOUNTER — Ambulatory Visit (HOSPITAL_COMMUNITY)
Admission: RE | Admit: 2015-09-29 | Discharge: 2015-09-29 | Disposition: A | Discharge status: Home / Self Care | Payer: Medicare Other | Source: Ambulatory Visit | Attending: Internal Medicine | Admitting: Internal Medicine

## 2015-09-29 ENCOUNTER — Encounter (INDEPENDENT_AMBULATORY_CARE_PROVIDER_SITE_OTHER): Payer: Self-pay

## 2015-09-29 DIAGNOSIS — Z6832 Body mass index (BMI) 32.0-32.9, adult: Secondary | ICD-10-CM | POA: Diagnosis not present

## 2015-09-29 DIAGNOSIS — R072 Precordial pain: Secondary | ICD-10-CM | POA: Insufficient documentation

## 2015-09-29 DIAGNOSIS — I10 Essential (primary) hypertension: Secondary | ICD-10-CM | POA: Insufficient documentation

## 2015-09-29 DIAGNOSIS — Z8249 Family history of ischemic heart disease and other diseases of the circulatory system: Secondary | ICD-10-CM | POA: Diagnosis not present

## 2015-09-29 DIAGNOSIS — Z87891 Personal history of nicotine dependence: Secondary | ICD-10-CM | POA: Insufficient documentation

## 2015-09-29 DIAGNOSIS — R0609 Other forms of dyspnea: Secondary | ICD-10-CM | POA: Diagnosis not present

## 2015-09-29 DIAGNOSIS — E669 Obesity, unspecified: Secondary | ICD-10-CM | POA: Diagnosis not present

## 2015-09-29 DIAGNOSIS — E119 Type 2 diabetes mellitus without complications: Secondary | ICD-10-CM | POA: Insufficient documentation

## 2015-09-29 MED ORDER — TECHNETIUM TC 99M TETROFOSMIN IV KIT
10.1000 | PACK | Freq: Once | INTRAVENOUS | Status: AC | PRN
  Administered 2015-09-29: 10.1 via INTRAVENOUS
  Filled 2015-09-29: qty 10

## 2015-09-29 MED ORDER — TECHNETIUM TC 99M TETROFOSMIN IV KIT
30.9000 | PACK | Freq: Once | INTRAVENOUS | Status: AC | PRN
  Administered 2015-09-29: 30.9 via INTRAVENOUS
  Filled 2015-09-29: qty 31

## 2015-09-29 MED ORDER — REGADENOSON 0.4 MG/5ML IV SOLN
0.4000 mg | Freq: Once | INTRAVENOUS | Status: AC
  Administered 2015-09-29: 0.4 mg via INTRAVENOUS

## 2015-09-29 MED ORDER — AMINOPHYLLINE 25 MG/ML IV SOLN
75.0000 mg | Freq: Once | INTRAVENOUS | Status: AC
  Administered 2015-09-29: 75 mg via INTRAVENOUS

## 2015-09-30 LAB — MYOCARDIAL PERFUSION IMAGING
CHL CUP NUCLEAR SRS: 3
CHL CUP NUCLEAR SSS: 6
LV dias vol: 62 mL (ref 46–106)
LVSYSVOL: 18 mL
Peak HR: 88 {beats}/min
Rest HR: 72 {beats}/min
SDS: 3
TID: 0.93

## 2015-10-01 ENCOUNTER — Other Ambulatory Visit: Payer: Self-pay | Admitting: Family Medicine

## 2015-10-03 ENCOUNTER — Other Ambulatory Visit: Payer: Self-pay

## 2015-10-03 NOTE — Telephone Encounter (Signed)
Patient wants a refill on metronidazole. This medication was not on her current medication list. Is a refill ok? Please advise.

## 2015-10-04 MED ORDER — METRONIDAZOLE 1 % EX GEL: CUTANEOUS | 1 refills | Status: DC

## 2015-10-06 ENCOUNTER — Ambulatory Visit (INDEPENDENT_AMBULATORY_CARE_PROVIDER_SITE_OTHER): Payer: Medicare Other | Admitting: Family Medicine

## 2015-10-06 ENCOUNTER — Encounter: Payer: Self-pay | Admitting: Family Medicine

## 2015-10-06 ENCOUNTER — Encounter: Payer: Self-pay | Admitting: Emergency Medicine

## 2015-10-06 ENCOUNTER — Ambulatory Visit (INDEPENDENT_AMBULATORY_CARE_PROVIDER_SITE_OTHER): Payer: Medicare Other | Admitting: Emergency Medicine

## 2015-10-06 VITALS — BP 134/72 | HR 71 | Ht 63.0 in | Wt 185.0 lb

## 2015-10-06 DIAGNOSIS — R7301 Impaired fasting glucose: Secondary | ICD-10-CM | POA: Diagnosis not present

## 2015-10-06 DIAGNOSIS — I10 Essential (primary) hypertension: Secondary | ICD-10-CM | POA: Diagnosis not present

## 2015-10-06 DIAGNOSIS — R5383 Other fatigue: Secondary | ICD-10-CM | POA: Diagnosis not present

## 2015-10-06 DIAGNOSIS — K219 Gastro-esophageal reflux disease without esophagitis: Secondary | ICD-10-CM

## 2015-10-06 DIAGNOSIS — J4540 Moderate persistent asthma, uncomplicated: Secondary | ICD-10-CM | POA: Diagnosis not present

## 2015-10-06 DIAGNOSIS — J383 Other diseases of vocal cords: Secondary | ICD-10-CM | POA: Diagnosis not present

## 2015-10-06 DIAGNOSIS — J309 Allergic rhinitis, unspecified: Secondary | ICD-10-CM | POA: Diagnosis not present

## 2015-10-06 DIAGNOSIS — R0789 Other chest pain: Secondary | ICD-10-CM | POA: Diagnosis not present

## 2015-10-06 DIAGNOSIS — Z8639 Personal history of other endocrine, nutritional and metabolic disease: Secondary | ICD-10-CM

## 2015-10-06 DIAGNOSIS — R0683 Snoring: Secondary | ICD-10-CM

## 2015-10-06 LAB — CBC WITH DIFFERENTIAL/PLATELET
BASOS PCT: 1 %
Basophils Absolute: 67 cells/uL (ref 0–200)
EOS PCT: 7 %
Eosinophils Absolute: 469 cells/uL (ref 15–500)
HCT: 37.7 % (ref 35.0–45.0)
HEMOGLOBIN: 12.5 g/dL (ref 11.7–15.5)
LYMPHS ABS: 1474 {cells}/uL (ref 850–3900)
LYMPHS PCT: 22 %
MCH: 28.2 pg (ref 27.0–33.0)
MCHC: 33.2 g/dL (ref 32.0–36.0)
MCV: 85.1 fL (ref 80.0–100.0)
MPV: 10.8 fL (ref 7.5–12.5)
Monocytes Absolute: 670 cells/uL (ref 200–950)
Monocytes Relative: 10 %
NEUTROS PCT: 60 %
Neutro Abs: 4020 cells/uL (ref 1500–7800)
Platelets: 358 10*3/uL (ref 140–400)
RBC: 4.43 MIL/uL (ref 3.80–5.10)
RDW: 13.7 % (ref 11.0–15.0)
WBC: 6.7 10*3/uL (ref 3.8–10.8)

## 2015-10-06 MED ORDER — DEXLANSOPRAZOLE 30 MG PO CPDR: 30.0000 mg | DELAYED_RELEASE_CAPSULE | Freq: Every day | ORAL | 2 refills | Status: DC

## 2015-10-06 NOTE — Assessment & Plan Note (Signed)
Continue Allegra, fluticasone, nasal saline spray

## 2015-10-06 NOTE — Progress Notes (Signed)
Subjective:    CC: HTN  HPI:  Hypertension- Pt denies chest pain, SOB, dizziness, or heart palpitations.  Taking meds as directed w/o problems.  Denies medication side effects.    Went to Endocrinology and was having CP. Saw Cards and had a neg stress test.  They felt it was likely reflux or her GB. Saw her pulm today and they felt she has been having reflux sxs.  She does have an appt with GI in October.  She is on Zegerid.  She has been waking up feeling short of breath and having some chest tightness. He can happen during the day as well. Sometimes is accompanied by a sharp pain in the chest. It can last about 15 minutes and then eased off. She denies any abdominal pain or nausea. No change in stools. Though she does take magnesium which she started about 2 months ago and has been really helping move her bowels with her chronic constipation. She really feels like the fatigue started about a year ago. She notices a big difference in her energy levels and being able to help take care of her grandchildren which she enjoys.  Snoring nightly. Had sleep study in 2012. Had some apneas but not high enough for treatment. She feels extremely fatigued and wonders if she could have developed sleep apnea. Her husband wears a CPAP.  She's also really been struggling to lose weight. She's been using my fitness pal to help retract calories and has been going to the gym regularly but can't seem to lose the weight.  BP 134/72 (BP Location: Left Arm, Patient Position: Sitting, Cuff Size: Normal)   Pulse 71   Ht 5\' 3"  (1.6 m)   Wt 185 lb (83.9 kg)   SpO2 97%   BMI 32.77 kg/m     Allergies  Allergen Reactions  . Astelin [Azelastine Hcl] Other (See Comments)    Headache  . Lisinopril     ACE cough  . Morphine     REACTION: Nausea  . Moxifloxacin     REACTION: confusion, dizziness, paranoia    Past Medical History:  Diagnosis Date  . Allergic rhinitis, cause unspecified   . Anxiety   . Benign  paroxysmal positional vertigo   . Cancer ([place])    basal cell carcinoma per right side of nostril  . Depressive disorder, not elsewhere classified   . Diabetes mellitus without complication ([place])    noted per H&P with Dr Dianah Field 08/03/2014   . Esophageal reflux   . History of hiatal hernia   . Hypertension   . Localized osteoarthrosis not specified whether primary or secondary, lower leg   . Other and unspecified hyperlipidemia   . Other diseases of vocal cords   . Peptic ulcer, unspecified site, unspecified as acute or chronic, without mention of hemorrhage, perforation, or obstruction   . Pneumonia    bilat pneumonia 1987  . Shortness of breath dyspnea    with anxiety; with climbing stairs  . TMJ (temporomandibular joint disorder)   . Unspecified arthropathy, hand   . Unspecified asthma(493.90)    triggered with a virus   . Unspecified hypothyroidism     Past Surgical History:  Procedure Laterality Date  . BREAST SURGERY     LUMPECTOMY / LEFT 10/12/2011  . KNEE ARTHROSCOPY     bilat   . NASAL SINUS SURGERY    . ROTATOR CUFF REPAIR     right   . thumb surgery  left hand 45 years ago   . TOTAL HIP ARTHROPLASTY Right 08/10/2014   Procedure: RIGHT TOTAL HIP ARTHROPLASTY ANTERIOR APPROACH;  Surgeon: Paralee Cancel, MD;  Location: [facility];  Service: Orthopedics;  Laterality: Right;    Social History   Social History  . Marital status: Married    Spouse name: N/A  . Number of children: 3  . Years of education: N/A   Occupational History  . Retired    Social History Main Topics  . Smoking status: Former Smoker    Packs/day: 0.30    Years: 2.00    Types: Cigarettes    Quit date: 02/12/1958  . Smokeless tobacco: Never Used  . Alcohol use Yes     Comment: wine socially  . Drug use: No  . Sexual activity: Yes   Other Topics Concern  . Not on file   Social History Narrative  . No narrative on file    Family History  Problem Relation Age of Onset  . Heart  disease Father   . Colon cancer Mother   . Liver cancer Brother   . Asthma Brother     Outpatient Encounter Prescriptions as of 10/06/2015  Medication Sig  . albuterol (PROVENTIL HFA) 108 (90 Base) MCG/ACT inhaler Inhale 2 puffs into the lungs every 6 (six) hours as needed for shortness of breath.  . Biotin (BIOTIN 5000) 5 MG CAPS Take 1 capsule by mouth daily.    Marland Kitchen buPROPion (WELLBUTRIN XL) 150 MG 24 hr tablet Take 150 mg by mouth daily.    . Calcium Carbonate-Vit D-Min 600-400 MG-UNIT TABS Take 1 tablet by mouth 2 (two) times daily.   . Cholecalciferol (VITAMIN D3) 2000 UNITS TABS Take 2,000 Units by mouth daily.   . clindamycin (CLEOCIN T) 1 % lotion Apply 1 application topically 2 (two) times daily as needed (bumps).   . clonazePAM (KLONOPIN) 0.5 MG tablet Take 0.125-0.25 mg by mouth daily as needed for anxiety.   . fexofenadine (ALLEGRA) 180 MG tablet Take 180 mg by mouth at bedtime.  . fluticasone (FLONASE) 50 MCG/ACT nasal spray Place 2 sprays into both nostrils daily as needed for allergies or rhinitis.  . folic acid (FOLVITE) 1 MG tablet Take 1 mg by mouth 2 (two) times daily.    . Ginkgo Biloba 40 MG TABS Take 1 tablet by mouth 3 (three) times daily.   Marland Kitchen levothyroxine (SYNTHROID, LEVOTHROID) 75 MCG tablet Take 75 mcg by mouth daily. Except Sunday 88 mcg  . levothyroxine (SYNTHROID, LEVOTHROID) 88 MCG tablet Take only sunday   . losartan-hydrochlorothiazide (HYZAAR) 100-25 MG tablet TAKE 1 TABLET BY MOUTH DAILY. NEED FOLLOW UP APPOINTMENT FOR MORE REFILLS  . Lysine 500 MG TABS Take 1 tablet by mouth daily.  . meloxicam (MOBIC) 15 MG tablet TAKE 1 TABLET BY MOUTH EVERY MORNING  . metroNIDAZOLE (METROGEL) 1 % gel APPLY EVERY DAY  . minoxidil (ROGAINE) 2 % external solution Apply 1 application topically 2 (two) times daily.  . Multiple Vitamins-Minerals (CENTRUM SILVER ULTRA WOMENS PO) Take 1 capsule by mouth daily.  Marland Kitchen omeprazole-sodium bicarbonate (ZEGERID) 40-1100 MG per capsule  Take 1 capsule by mouth daily before breakfast.    . PARoxetine (PAXIL-CR) 12.5 MG 24 hr tablet Take 12.5 mg by mouth 2 (two) times daily.   Marland Kitchen PULMICORT FLEXHALER 180 MCG/ACT inhaler INHALE 2 PUFFS INTO THE LUNGS DAILY.  . Saline (SIMPLY SALINE) 0.9 % AERS Place 1 spray into the nose 2 (two) times daily as needed (dry).  Marland Kitchen  Dexlansoprazole 30 MG capsule Take 1 capsule (30 mg total) by mouth daily.   No facility-administered encounter medications on file as of 10/06/2015.        Review of Systems: No fevers, chills, night sweats, weight loss, chest pain, or shortness of breath.   Objective:    General: Well Developed, well nourished, and in no acute distress.  Neuro: Alert and oriented x3, extra-ocular muscles intact, sensation grossly intact.  HEENT: Normocephalic, atraumatic  Skin: Warm and dry, no rashes. Cardiac: Regular rate and rhythm, no murmurs rubs or gallops, no lower extremity edema.  Respiratory: Clear to auscultation bilaterally. Not using accessory muscles, speaking in full sentences.   Impression and Recommendations:    HTN - Well controlled. Continue current regimen. Follow up in 6 months.   Snoring  - Stop bang questionnaire score of 4. This is significant for high risk for sleep apnea. Recommend split-night sleep study. We'll go ahead and place order to schedule her at [facility].  Atypical CP - could be GERD related. Will change to Dexilant.  If not covered then do a BID PPI.  As he can call if it's not covered.  Fatigue- eval for  Lab disorders.   Obesity/BMI 32-continue with diet and exercise and let's work on trying to figure out why her energy levels are so low.

## 2015-10-06 NOTE — Assessment & Plan Note (Signed)
Being irritated by GERD and rhinitis

## 2015-10-06 NOTE — Assessment & Plan Note (Signed)
I think both her chest discomfort and also her poorly controlled upper airway symptoms are due to her hiatal hernia and GERD. She appears to be having breakthrough even on Zegerid. He has plans to see gastroenterology

## 2015-10-06 NOTE — Patient Instructions (Signed)
Please continue your pulmicort daily. Rinse and gargle after using Take albuterol 2 puffs up to every 4 hours if needed for shortness of breath.  Continue your allegra, flonase nasal spray.  Continue Zegerid. You may need to increase this - review with Gastroenterology.  Follow with Dr Lamonte Sakai in 6 months or sooner if you have any problems

## 2015-10-06 NOTE — Assessment & Plan Note (Signed)
Continue Pulmicort and albuterol as needed

## 2015-10-06 NOTE — Progress Notes (Signed)
Subjective:    Patient ID: [name], female    DOB: [birth year], 70 y.o.   MRN: [id number]  Asthma  Her past medical history is significant for asthma.   70 year old woman who has been followed by Dr. Joya Gaskins in our office for allergic rhinitis, mild persistent asthma, some degree of upper airway irritation syndrome based on her office notes. She underwent a right hip replacement in 07/2014 with Dr Jerilynn Mages. Alvan Dame.  She unfortunately just started to get URI sx, exposed through her grandchild. She is having some am cough, bothers her most during the Fall, some hoarse voice. She is on allegra, uses flonase only prn. She is maintained on pulmicort qd. She remains active. No flares. She is on mucinex right now.   She has hx ? RA followed by Dr Ouida Sills, was treated for 2 yrs w MTX, now off of it.   ROV 01/11/15 -- follow-up visit for allergic rhinitis cough, upper airway irritation, mild persistent asthma. Also carries a diagnosis of seronegative rheumatoid arthritis. She has been on methotrexate in the past but not currently.  She has been using Pulmicort. She has had cyclical episodes of cough, some rhinitis, UA noise. Also w HA's. Can be bad for a week, then better. Has used mucinex with some improvement. She is on allegra, does not use her fluticasone because it makes her lose her smell.  She would like to avoid if possible. She was treated with augmentin and pred taper by her PCP in October.   ROV 04/12/15 -- Patient follows up for upper airway irritation and mild persistent asthma in the setting of allergic rhinitis. She has a history of chronic cough. She also carries a history of seronegative rheumatoid arthritis.  She returns today reporting that she has had URI sx about 10 days ago, had fever, cough, sneezing. Her Flu test was negative 8 day ago. She rested, took fluids, was starting to feel better. Now having body aches, chills, itching eyes. More cough, now prod of yellow. Yellow green from her  sinuses, having HA's. She is on her allegra, fluticasone NS. Still on pulmicort qd, has not taken albuterol.   ROV 10/06/15 -- This is a follow-up visit for patient with mild persistent asthma, allergic rhinitis and upper airway irritation syndrome with vocal cord dysfunction and chronic cough. She also has a history of seronegative rheumatoid arthritis. She has been well until about 2 days ago, was exposed to her grandson w URI, developed rhinitis, some hoarse voice. Her cough has been stable. She was having mid chest pain, was seen by Dr Percival Spanish and has had reassuring cardiac stress test. She is to be evaluated by GI, has a hiatal hernia. She is on Zegerid. She remains on pulmicort, albuterol prn. She is on allegra and flonase, saline spray for rhinitis.    Review of Systems As per HPI     Objective:   Physical Exam  Vitals:   10/06/15 1231  BP: 130/84  Pulse: 71  SpO2: 98%  Weight: 185 lb (83.9 kg)  Height: 5\' 3"  (1.6 m)   Gen: Pleasant, well-nourished, in no distress,  normal affect  ENT: No lesions,  mouth clear,  oropharynx clear, no postnasal drip  Neck: No JVD, no TMG, no carotid bruits  Lungs: No use of accessory muscles, clear without rales or rhonchi  Cardiovascular: RRR, heart sounds normal,  no peripheral edema  Musculoskeletal: No deformities, no cyanosis or clubbing  Neuro: alert, non focal  Skin: Warm, no lesions  or rashes      Assessment & Plan:  GERD I think both her chest discomfort and also her poorly controlled upper airway symptoms are due to her hiatal hernia and GERD. She appears to be having breakthrough even on Zegerid. He has plans to see gastroenterology  VOCAL CORD DISORDER Being irritated by GERD and rhinitis  Allergic rhinitis Continue Allegra, fluticasone, nasal saline spray  Asthma with allergic rhinitis Continue Pulmicort and albuterol as needed  Baltazar Apo, MD, PhD 10/06/2015, 12:45 PM [facility] [phone] or if no answer [phone]

## 2015-10-06 NOTE — Patient Instructions (Addendum)
Food Choices for Gastroesophageal Reflux Disease, Adult When you have gastroesophageal reflux disease (GERD), the foods you eat and your eating habits are very important. Choosing the right foods can help ease the discomfort of GERD. WHAT GENERAL GUIDELINES DO I NEED TO FOLLOW?  Choose fruits, vegetables, whole grains, low-fat dairy products, and low-fat meat, fish, and poultry.  Limit fats such as oils, salad dressings, butter, nuts, and avocado.  Keep a food diary to identify foods that cause symptoms.  Avoid foods that cause reflux. These may be different for different people.  Eat frequent small meals instead of three large meals each day.  Eat your meals slowly, in a relaxed setting.  Limit fried foods.  Cook foods using methods other than frying.  Avoid drinking alcohol.  Avoid drinking large amounts of liquids with your meals.  Avoid bending over or lying down until 2-3 hours after eating. WHAT FOODS ARE NOT RECOMMENDED? The following are some foods and drinks that may worsen your symptoms: Vegetables Tomatoes. Tomato juice. Tomato and spaghetti sauce. Chili peppers. Onion and garlic. Horseradish. Fruits Oranges, grapefruit, and lemon (fruit and juice). Meats High-fat meats, fish, and poultry. This includes hot dogs, ribs, ham, sausage, salami, and bacon. Dairy Whole milk and chocolate milk. Sour cream. Cream. Butter. Ice cream. Cream cheese.  Beverages Coffee and tea, with or without caffeine. Carbonated beverages or energy drinks. Condiments Hot sauce. Barbecue sauce.  Sweets/Desserts Chocolate and cocoa. Donuts. Peppermint and spearmint. Fats and Oils High-fat foods, including French fries and potato chips. Other Vinegar. Strong spices, such as black pepper, white pepper, red pepper, cayenne, curry powder, cloves, ginger, and chili powder. The items listed above may not be a complete list of foods and beverages to avoid. Contact your dietitian for more  information.   This information is not intended to replace advice given to you by your health care provider. Make sure you discuss any questions you have with your health care provider.   Document Released: 01/29/2005 Document Revised: 02/19/2014 Document Reviewed: 12/03/2012 Elsevier Interactive Patient Education 2016 [employer].  

## 2015-10-07 LAB — COMPLETE METABOLIC PANEL WITH GFR
ALBUMIN: 3.9 g/dL (ref 3.6–5.1)
ALK PHOS: 59 U/L (ref 33–130)
ALT: 29 U/L (ref 6–29)
AST: 16 U/L (ref 10–35)
BILIRUBIN TOTAL: 0.4 mg/dL (ref 0.2–1.2)
BUN: 17 mg/dL (ref 7–25)
CO2: 28 mmol/L (ref 20–31)
CREATININE: 0.79 mg/dL (ref 0.60–0.93)
Calcium: 9.7 mg/dL (ref 8.6–10.4)
Chloride: 101 mmol/L (ref 98–110)
GFR, Est African American: 87 mL/min (ref >=60)
GFR, Est Non African American: 76 mL/min (ref >=60)
GLUCOSE: 93 mg/dL (ref 65–99)
Potassium: 4.9 mmol/L (ref 3.5–5.3)
SODIUM: 138 mmol/L (ref 135–146)
TOTAL PROTEIN: 6.4 g/dL (ref 6.1–8.1)

## 2015-10-07 LAB — FERRITIN: Ferritin: 50 ng/mL (ref 20–288)

## 2015-10-07 LAB — SEDIMENTATION RATE: SED RATE: 23 mm/h (ref 0–30)

## 2015-10-07 LAB — VITAMIN B12: Vitamin B-12: 956 pg/mL (ref 200–1100)

## 2015-10-07 LAB — HEMOGLOBIN A1C
HEMOGLOBIN A1C: 6.1 % — AB (ref <5.7)
Mean Plasma Glucose: 128 mg/dL

## 2015-10-07 LAB — FOLATE

## 2015-10-07 LAB — TSH: TSH: 0.65 mIU/L

## 2015-10-07 LAB — VITAMIN D 25 HYDROXY (VIT D DEFICIENCY, FRACTURES): Vit D, 25-Hydroxy: 46 ng/mL (ref 30–100)

## 2015-10-10 ENCOUNTER — Telehealth: Payer: Self-pay | Admitting: Family Medicine

## 2015-10-10 LAB — VITAMIN B1: Vitamin B1 (Thiamine): 26 nmol/L (ref 8–30)

## 2015-10-10 LAB — VITAMIN B6: Vitamin B6: 24.7 ng/mL — ABNORMAL HIGH (ref 2.1–21.7)

## 2015-10-10 MED ORDER — PANTOPRAZOLE SODIUM 40 MG PO TBEC: 40.0000 mg | DELAYED_RELEASE_TABLET | Freq: Two times a day (BID) | ORAL | 0 refills | Status: DC

## 2015-10-10 NOTE — Telephone Encounter (Signed)
Pt advised of new Rx and dosage instructions. Verbalized understanding. No further questions.

## 2015-10-10 NOTE — Telephone Encounter (Signed)
Call patient: Her insurance will not pay for dexilant. This we will try switching her to pantoprazole 40 mg twice a day. She will take the first dose about 30 minutes before breakfast and the second one around that time.

## 2015-10-11 ENCOUNTER — Ambulatory Visit: Payer: Medicare Other | Admitting: Emergency Medicine

## 2015-10-18 ENCOUNTER — Other Ambulatory Visit: Payer: Self-pay | Admitting: Family Medicine

## 2015-10-18 DIAGNOSIS — Z1231 Encounter for screening mammogram for malignant neoplasm of breast: Secondary | ICD-10-CM

## 2015-10-21 ENCOUNTER — Other Ambulatory Visit: Payer: Self-pay | Admitting: Family Medicine

## 2015-10-28 ENCOUNTER — Ambulatory Visit
Admission: RE | Admit: 2015-10-28 | Discharge: 2015-10-28 | Disposition: A | Discharge status: Home / Self Care | Payer: Medicare Other | Source: Ambulatory Visit | Attending: Family Medicine | Admitting: Family Medicine

## 2015-10-28 DIAGNOSIS — Z1231 Encounter for screening mammogram for malignant neoplasm of breast: Secondary | ICD-10-CM

## 2015-10-29 ENCOUNTER — Other Ambulatory Visit: Payer: Self-pay | Admitting: Emergency Medicine

## 2015-11-01 DIAGNOSIS — F422 Mixed obsessional thoughts and acts: Secondary | ICD-10-CM | POA: Diagnosis not present

## 2015-11-01 DIAGNOSIS — F410 Panic disorder [episodic paroxysmal anxiety] without agoraphobia: Secondary | ICD-10-CM | POA: Diagnosis not present

## 2015-11-01 DIAGNOSIS — F902 Attention-deficit hyperactivity disorder, combined type: Secondary | ICD-10-CM | POA: Diagnosis not present

## 2015-11-05 ENCOUNTER — Other Ambulatory Visit: Payer: Self-pay | Admitting: Family Medicine

## 2015-11-10 ENCOUNTER — Ambulatory Visit (INDEPENDENT_AMBULATORY_CARE_PROVIDER_SITE_OTHER): Payer: Medicare Other | Admitting: Family Medicine

## 2015-11-10 ENCOUNTER — Encounter: Payer: Self-pay | Admitting: Family Medicine

## 2015-11-10 VITALS — BP 140/69 | HR 81 | Wt 183.0 lb

## 2015-11-10 DIAGNOSIS — M7989 Other specified soft tissue disorders: Secondary | ICD-10-CM | POA: Diagnosis not present

## 2015-11-10 DIAGNOSIS — E038 Other specified hypothyroidism: Secondary | ICD-10-CM | POA: Diagnosis not present

## 2015-11-10 DIAGNOSIS — R791 Abnormal coagulation profile: Secondary | ICD-10-CM

## 2015-11-10 DIAGNOSIS — R7989 Other specified abnormal findings of blood chemistry: Secondary | ICD-10-CM

## 2015-11-10 DIAGNOSIS — R748 Abnormal levels of other serum enzymes: Secondary | ICD-10-CM | POA: Diagnosis not present

## 2015-11-10 NOTE — Progress Notes (Signed)
Subjective:     Patient ID: [name], female   DOB: [birth year], 70 y.o.   MRN: [id number]  HPI The patient is a 70 y.o. Caucasian female presenting today with complaints of left ankle and foot swelling. The patient notes that she recently returned from traveling on Monday and was on numerous short flights and a 5 hour car ride. The patient notes that she noticed the swelling prior to her travel but that it has worsened. The patient states that the swelling has been present for approximately 1 week and she has been elevating her leg when ever possible. The patient states that she has not been doing her routine exercising due to her busy schedule. The patient notes that the affected area has never been red, warm, or painful. The patient denies any shortness of breath, chest pain, palpitation, or fever.  Review of Systems  Constitutional: Positive for fatigue and unexpected weight change. Negative for activity change, appetite change, chills, diaphoresis and fever.  Eyes: Negative.   Respiratory: Negative for cough, chest tightness, shortness of breath and wheezing.   Cardiovascular: Positive for leg swelling. Negative for chest pain and palpitations.  Gastrointestinal: Negative.   Endocrine: Negative.   Genitourinary: Negative.   Musculoskeletal: Negative.   Skin: Negative.   Allergic/Immunologic: Negative.   Neurological: Negative for dizziness, syncope, weakness, light-headedness, numbness and headaches.  Psychiatric/Behavioral: Negative.       Objective:   Physical Exam  Constitutional: She is oriented to person, place, and time. She appears well-developed and well-nourished. No distress.  HENT:  Head: Normocephalic and atraumatic.  Right Ear: External ear normal.  Left Ear: External ear normal.  Nose: Nose normal.  Mouth/Throat: Oropharynx is clear and moist. No oropharyngeal exudate.  Eyes: Conjunctivae and EOM are normal. Pupils are equal, round, and reactive to light. Right  eye exhibits no discharge. Left eye exhibits no discharge. No scleral icterus.  Cardiovascular: Normal rate, regular rhythm, normal heart sounds and intact distal pulses.  Exam reveals no gallop and no friction rub.   No murmur heard. Pulmonary/Chest: Effort normal and breath sounds normal. No respiratory distress. She has no wheezes. She has no rales. She exhibits no tenderness.  Musculoskeletal:       Left ankle: She exhibits swelling.       Feet:  Neurological: She is alert and oriented to person, place, and time. No cranial nerve deficit. Coordination normal.  Skin: Skin is warm and dry. No rash noted. She is not diaphoretic. No erythema. No pallor.  Psychiatric: She has a normal mood and affect. Her behavior is normal. Judgment and thought content normal.      Assessment:       Lamarr was seen today for foot swelling.  Diagnoses and all orders for this visit:  Left leg swelling -     TSH -     CBC w/Diff -     COMPLETE METABOLIC PANEL WITH GFR -     Urinalysis -     D-dimer, quantitative (not at [facility])  Other specified hypothyroidism -     TSH   Plan:     1. Left leg swelling - Discussed with patient that her symptoms are most likely secondary to chronic venous insufficiency. Patient to get stat D-dimer, CMP, CBC with diff, Ua, and TSH to rule-out other possibly pathologic etiologies. Will call patient with laboratory results. Will determine need for further medical intervention upon receiving results.   2. Hypothyroidism - Patient given electronic requisition form for  serum TSH levels. Patient is currently on Synthroid 75 mcg every day except for Sunday when she takes 48mcg. Will determine need for medication adjustments upon receiving results. Will continue to monitor.

## 2015-11-11 LAB — CBC WITH DIFFERENTIAL/PLATELET
BASOS PCT: 1 %
Basophils Absolute: 60 cells/uL (ref 0–200)
Eosinophils Absolute: 300 cells/uL (ref 15–500)
Eosinophils Relative: 5 %
HEMATOCRIT: 37.5 % (ref 35.0–45.0)
Hemoglobin: 12.3 g/dL (ref 11.7–15.5)
LYMPHS ABS: 1320 {cells}/uL (ref 850–3900)
LYMPHS PCT: 22 %
MCH: 28 pg (ref 27.0–33.0)
MCHC: 32.8 g/dL (ref 32.0–36.0)
MCV: 85.2 fL (ref 80.0–100.0)
MPV: 10.5 fL (ref 7.5–12.5)
Monocytes Absolute: 660 cells/uL (ref 200–950)
Monocytes Relative: 11 %
Neutro Abs: 3660 cells/uL (ref 1500–7800)
Neutrophils Relative %: 61 %
Platelets: 335 10*3/uL (ref 140–400)
RBC: 4.4 MIL/uL (ref 3.80–5.10)
RDW: 14.1 % (ref 11.0–15.0)
WBC: 6 10*3/uL (ref 3.8–10.8)

## 2015-11-11 LAB — URINALYSIS
BILIRUBIN URINE: NEGATIVE
Glucose, UA: NEGATIVE
HGB URINE DIPSTICK: NEGATIVE
KETONES UR: NEGATIVE
Leukocytes, UA: NEGATIVE
NITRITE: NEGATIVE
PROTEIN: NEGATIVE
SPECIFIC GRAVITY, URINE: 1.013 (ref 1.001–1.035)
pH: 7 (ref 5.0–8.0)

## 2015-11-11 LAB — COMPLETE METABOLIC PANEL WITH GFR
ALK PHOS: 58 U/L (ref 33–130)
ALT: 35 U/L — AB (ref 6–29)
AST: 21 U/L (ref 10–35)
Albumin: 3.9 g/dL (ref 3.6–5.1)
BILIRUBIN TOTAL: 0.4 mg/dL (ref 0.2–1.2)
BUN: 20 mg/dL (ref 7–25)
CALCIUM: 9.5 mg/dL (ref 8.6–10.4)
CO2: 23 mmol/L (ref 20–31)
CREATININE: 0.86 mg/dL (ref 0.60–0.93)
Chloride: 102 mmol/L (ref 98–110)
GFR, EST AFRICAN AMERICAN: 79 mL/min (ref >=60)
GFR, Est Non African American: 68 mL/min (ref >=60)
Glucose, Bld: 87 mg/dL (ref 65–99)
Potassium: 4.6 mmol/L (ref 3.5–5.3)
Sodium: 141 mmol/L (ref 135–146)
TOTAL PROTEIN: 6.2 g/dL (ref 6.1–8.1)

## 2015-11-11 LAB — TSH: TSH: 0.49 m[IU]/L

## 2015-11-12 LAB — D-DIMER, QUANTITATIVE (NOT AT ARMC): D DIMER QUANT: 0.65 ug{FEU}/mL — AB (ref <0.50)

## 2015-11-13 NOTE — Addendum Note (Signed)
Addended by: Beatrice Lecher D on: 11/13/2015 01:12 PM   Modules accepted: Orders

## 2015-11-15 ENCOUNTER — Other Ambulatory Visit: Payer: Self-pay | Admitting: Family

## 2015-11-15 DIAGNOSIS — F422 Mixed obsessional thoughts and acts: Secondary | ICD-10-CM | POA: Diagnosis not present

## 2015-11-15 DIAGNOSIS — F410 Panic disorder [episodic paroxysmal anxiety] without agoraphobia: Secondary | ICD-10-CM | POA: Diagnosis not present

## 2015-11-15 DIAGNOSIS — F902 Attention-deficit hyperactivity disorder, combined type: Secondary | ICD-10-CM | POA: Diagnosis not present

## 2015-11-15 NOTE — Addendum Note (Signed)
Addended by: Teddy Spike on: 11/15/2015 06:15 PM   Modules accepted: Orders

## 2015-11-16 ENCOUNTER — Ambulatory Visit (HOSPITAL_BASED_OUTPATIENT_CLINIC_OR_DEPARTMENT_OTHER)
Admission: RE | Admit: 2015-11-16 | Discharge: 2015-11-16 | Disposition: A | Discharge status: Home / Self Care | Payer: Medicare Other | Source: Ambulatory Visit | Attending: Family Medicine | Admitting: Family Medicine

## 2015-11-16 DIAGNOSIS — R791 Abnormal coagulation profile: Secondary | ICD-10-CM | POA: Insufficient documentation

## 2015-11-16 DIAGNOSIS — M79605 Pain in left leg: Secondary | ICD-10-CM | POA: Diagnosis not present

## 2015-11-16 DIAGNOSIS — M7989 Other specified soft tissue disorders: Secondary | ICD-10-CM | POA: Insufficient documentation

## 2015-11-16 DIAGNOSIS — R7989 Other specified abnormal findings of blood chemistry: Secondary | ICD-10-CM

## 2015-11-16 DIAGNOSIS — M25462 Effusion, left knee: Secondary | ICD-10-CM | POA: Diagnosis not present

## 2015-11-17 DIAGNOSIS — H5213 Myopia, bilateral: Secondary | ICD-10-CM | POA: Diagnosis not present

## 2015-11-17 DIAGNOSIS — H1789 Other corneal scars and opacities: Secondary | ICD-10-CM | POA: Diagnosis not present

## 2015-11-17 DIAGNOSIS — H25813 Combined forms of age-related cataract, bilateral: Secondary | ICD-10-CM | POA: Diagnosis not present

## 2015-11-17 DIAGNOSIS — H43813 Vitreous degeneration, bilateral: Secondary | ICD-10-CM | POA: Diagnosis not present

## 2015-11-24 DIAGNOSIS — F902 Attention-deficit hyperactivity disorder, combined type: Secondary | ICD-10-CM | POA: Diagnosis not present

## 2015-11-24 DIAGNOSIS — F410 Panic disorder [episodic paroxysmal anxiety] without agoraphobia: Secondary | ICD-10-CM | POA: Diagnosis not present

## 2015-11-24 DIAGNOSIS — F422 Mixed obsessional thoughts and acts: Secondary | ICD-10-CM | POA: Diagnosis not present

## 2015-12-05 ENCOUNTER — Other Ambulatory Visit: Payer: Self-pay | Admitting: Emergency Medicine

## 2015-12-06 DIAGNOSIS — F422 Mixed obsessional thoughts and acts: Secondary | ICD-10-CM | POA: Diagnosis not present

## 2015-12-06 DIAGNOSIS — F902 Attention-deficit hyperactivity disorder, combined type: Secondary | ICD-10-CM | POA: Diagnosis not present

## 2015-12-06 DIAGNOSIS — F410 Panic disorder [episodic paroxysmal anxiety] without agoraphobia: Secondary | ICD-10-CM | POA: Diagnosis not present

## 2015-12-08 ENCOUNTER — Encounter (HOSPITAL_BASED_OUTPATIENT_CLINIC_OR_DEPARTMENT_OTHER): Payer: Medicare Other

## 2015-12-10 ENCOUNTER — Other Ambulatory Visit: Payer: Self-pay | Admitting: Family Medicine

## 2015-12-15 DIAGNOSIS — F410 Panic disorder [episodic paroxysmal anxiety] without agoraphobia: Secondary | ICD-10-CM | POA: Diagnosis not present

## 2015-12-15 DIAGNOSIS — F422 Mixed obsessional thoughts and acts: Secondary | ICD-10-CM | POA: Diagnosis not present

## 2015-12-15 DIAGNOSIS — F902 Attention-deficit hyperactivity disorder, combined type: Secondary | ICD-10-CM | POA: Diagnosis not present

## 2015-12-20 DIAGNOSIS — F410 Panic disorder [episodic paroxysmal anxiety] without agoraphobia: Secondary | ICD-10-CM | POA: Diagnosis not present

## 2015-12-20 DIAGNOSIS — F902 Attention-deficit hyperactivity disorder, combined type: Secondary | ICD-10-CM | POA: Diagnosis not present

## 2015-12-20 DIAGNOSIS — F422 Mixed obsessional thoughts and acts: Secondary | ICD-10-CM | POA: Diagnosis not present

## 2015-12-27 DIAGNOSIS — F422 Mixed obsessional thoughts and acts: Secondary | ICD-10-CM | POA: Diagnosis not present

## 2015-12-27 DIAGNOSIS — F902 Attention-deficit hyperactivity disorder, combined type: Secondary | ICD-10-CM | POA: Diagnosis not present

## 2015-12-27 DIAGNOSIS — F410 Panic disorder [episodic paroxysmal anxiety] without agoraphobia: Secondary | ICD-10-CM | POA: Diagnosis not present

## 2015-12-30 ENCOUNTER — Ambulatory Visit (INDEPENDENT_AMBULATORY_CARE_PROVIDER_SITE_OTHER): Payer: Medicare Other | Admitting: Family Medicine

## 2015-12-30 ENCOUNTER — Telehealth: Payer: Self-pay | Admitting: Family Medicine

## 2015-12-30 ENCOUNTER — Encounter: Payer: Self-pay | Admitting: Family Medicine

## 2015-12-30 VITALS — BP 134/72 | HR 79 | Wt 185.0 lb

## 2015-12-30 DIAGNOSIS — R2242 Localized swelling, mass and lump, left lower limb: Secondary | ICD-10-CM | POA: Diagnosis not present

## 2015-12-30 DIAGNOSIS — E672 Megavitamin-B6 syndrome: Secondary | ICD-10-CM

## 2015-12-30 DIAGNOSIS — G5603 Carpal tunnel syndrome, bilateral upper limbs: Secondary | ICD-10-CM

## 2015-12-30 DIAGNOSIS — R748 Abnormal levels of other serum enzymes: Secondary | ICD-10-CM | POA: Diagnosis not present

## 2015-12-30 DIAGNOSIS — R202 Paresthesia of skin: Secondary | ICD-10-CM | POA: Diagnosis not present

## 2015-12-30 DIAGNOSIS — M7122 Synovial cyst of popliteal space [Baker], left knee: Secondary | ICD-10-CM

## 2015-12-30 NOTE — Telephone Encounter (Signed)
Pt notified and transferred to front desk

## 2015-12-30 NOTE — Telephone Encounter (Signed)
Call patient: [name] cyst was a decent size when I went back and reviewed the image. We could certainly get her set up with one of our sports medicine doctors to have it further evaluated if it's causing problems. If it's not really painful right now we can always wait until it starts bothering her and then get her in with Dr. Georgina Snell or Dr. Dianah Field

## 2015-12-30 NOTE — Progress Notes (Signed)
Subjective:    CC: Lump on ankle  HPI: Patient comes in today complaining of a lump on her left ankle. She says it's been there for several months just a little less than a year. She denies any known injury or trauma. No prior surgeries on that foot. She says he really only notices it when she tries to cross her ankles and it feels very sore. But otherwise it does not limit her activities.  She also complains of numbness tingling in her hands and feet. She says it's been going on for months. No exacerbating factors. Though when she lies down at night sometimes it'll affect the arm on the side that she is lying on. She does notice it more when she is holding up her phone to talk. He does report swelling of her hands at night though it only seems to be better after she gets up and gets moving during the daytime. She says she really tries to avoid excess salt.  She's also noticed a knot on the back of her left leg. She was getting her lower she may Dopplers she was noted to have a Baker's cyst. She says it doesn't always bother her. Cyst measured 1.1 x 6.7 cm.   Past medical history, Surgical history, Family history not pertinant except as noted below, Social history, Allergies, and medications have been entered into the medical record, reviewed, and corrections made.   Review of Systems: No fevers, chills, night sweats, weight loss, chest pain, or shortness of breath.   Objective:    General: Well Developed, well nourished, and in no acute distress.  Neuro: Alert and oriented x3, extra-ocular muscles intact, sensation grossly intact. Positive Tennille's and Phalen's test bilaterally on both wrists. HEENT: Normocephalic, atraumatic  Skin: Warm and dry, no rashes. Cardiac: Regular rate and rhythm, no murmurs rubs or gallops, no lower extremity edema.  Respiratory: Clear to auscultation bilaterally. Not using accessory muscles, speaking in full sentences. MSK: Left ankle with normal range of  motion. She does have a palpable lump over the Achilles tendon area on the left heel. It's just mildly tender to touch. She does have some tender medially behind the left knee as well but normal flexion extension of the knee. Ears and wrists with normal range of motion. She does have some deformity of some of her fingers.  Impression and Recommendations:   Left ankle lump-we'll get ultrasound for further evaluation. Could be a lipoma versus a partially torn Achilles tendon. Will schedule for ultrasound for further evaluation.  Baker's cyst behind the left knee. It is fairly large. We could offer to have her see one of our sports medicine doctors and have it aspirated to see if this provides some pain relief. That she's not having a lot of discomfort today.  Carpal tunnel syndrome-she does have numbness in tickling in both hands. Tinel's and Phalen's tests were both positive. Will treat with night cock-up splints for 3-4 weeks.  She also has some numbness and tingling in her feet. She says it's mostly the distal foot bilaterally affecting all the toes. Unclear etiology here. We did do a fair amount of blood work couple months ago and it was all normal. Will add in a couple of additional blood test today.  Hypervitaminosis B6-due to recheck level.

## 2016-01-12 ENCOUNTER — Ambulatory Visit: Payer: Medicare Other | Admitting: Family Medicine

## 2016-01-13 DIAGNOSIS — E7800 Pure hypercholesterolemia, unspecified: Secondary | ICD-10-CM | POA: Diagnosis not present

## 2016-01-13 DIAGNOSIS — R5382 Chronic fatigue, unspecified: Secondary | ICD-10-CM | POA: Diagnosis not present

## 2016-01-13 DIAGNOSIS — E038 Other specified hypothyroidism: Secondary | ICD-10-CM | POA: Diagnosis not present

## 2016-01-13 DIAGNOSIS — R7301 Impaired fasting glucose: Secondary | ICD-10-CM | POA: Diagnosis not present

## 2016-01-16 ENCOUNTER — Encounter: Payer: Self-pay | Admitting: Family Medicine

## 2016-01-16 ENCOUNTER — Ambulatory Visit (INDEPENDENT_AMBULATORY_CARE_PROVIDER_SITE_OTHER): Payer: Medicare Other | Admitting: Family Medicine

## 2016-01-16 VITALS — BP 135/72 | HR 79 | Temp 97.7°F

## 2016-01-16 DIAGNOSIS — M6790 Unspecified disorder of synovium and tendon, unspecified site: Secondary | ICD-10-CM | POA: Diagnosis not present

## 2016-01-16 DIAGNOSIS — M7122 Synovial cyst of popliteal space [Baker], left knee: Secondary | ICD-10-CM

## 2016-01-16 DIAGNOSIS — R202 Paresthesia of skin: Secondary | ICD-10-CM | POA: Diagnosis not present

## 2016-01-16 DIAGNOSIS — R748 Abnormal levels of other serum enzymes: Secondary | ICD-10-CM | POA: Diagnosis not present

## 2016-01-16 DIAGNOSIS — E672 Megavitamin-B6 syndrome: Secondary | ICD-10-CM | POA: Diagnosis not present

## 2016-01-16 NOTE — Progress Notes (Signed)
   Subjective:    I'm seeing this patient as a consultation for: Dr. Beatrice Lecher  CC: Left baker cyst, left foot lump  HPI: Patient had left ankle swelling 2 months ago that persisted for weeks. She got an ultrasound that ruled out DVT and the swelling self resolved. However, a Baker cyst was found incidentally on ultrasound. Patient says the cyst is not painful or bothersome. She has also noticed a lump on her left Achilles tendon for the past year that is not painful or bothersome. She has carpal tunnel syndrome that is well-controlled with nighttime splints.  Past medical history, Surgical history, Family history not pertinant except as noted below, Social history, Allergies, and medications have been entered into the medical record, reviewed, and no changes needed.   Review of Systems: No headache, visual changes, nausea, vomiting, diarrhea, constipation, dizziness, abdominal pain, skin rash, fevers, chills, night sweats, weight loss, swollen lymph nodes, body aches, muscle aches, chest pain, shortness of breath, mood changes, visual or auditory hallucinations.   Objective:    Vitals:   01/16/16 1506  BP: 135/72  Pulse: 79  Temp: 97.7 F (36.5 C)   General: Well Developed, well nourished, and in no acute distress.  Neuro/Psych: Alert and oriented x3, extra-ocular muscles intact, able to move all 4 extremities, sensation grossly intact. Skin: Warm and dry, no rashes noted.  Respiratory: Not using accessory muscles, speaking in full sentences, trachea midline.  Cardiovascular: Pulses palpable, no extremity edema. Abdomen: Does not appear distended. MSK: Left ankle: Normal appearing. No edema No tenderness to palpation Full ROM 2x3 cm mass along left Achilles superior to tendon insertion  Left knee: Normal appearing. No edema or erythema. No tenderness to palpation ROM 0-120 1 cm cyst in popliteal fossa  Ultrasound of L Achilles tendon: 1.2 cm nodule along Achilles  tendon. No significant hypoechoic defect within the tendon. No significant vascular activity present within the tendon. This is consistent with a tendon nodule Normal-appearing calcaneal insertion  No results found for this or any previous visit (from the past 24 hour(s)). No results found.  Impression and Recommendations:    Assessment and Plan: 70 y.o. female with left Baker cyst and left Achilles tendon nodule.  Left Baker cyst: Small and asymptomatic; observe.  Left Achilles tendon nodule: Noted on ultrasound. Asymptomatic; eccentric exercises for underlying tendinopathy.   Follow up in 1-3 months.  Discussed warning signs or symptoms. Please see discharge instructions. Patient expresses understanding.   This patient was seen and interviewed and examined independently by myself. Lynne Leader, MD

## 2016-01-16 NOTE — Patient Instructions (Signed)
Thank you for coming in today. Do the exercises we discussed.  Return for recheck in 1-3 months or sooner if needed.   This is an Achilles Tendon Nodule.   Tendinitis Tendinitis is inflammation of a tendon. A tendon is a strong cord of tissue that connects muscle to bone. Tendinitis can affect any tendon, but it most commonly affects the shoulder tendon (rotator cuff), ankle tendon (Achilles tendon), elbow tendon (triceps tendon), or one of the tendons in the wrist. What are the causes? This condition may be caused by:  Overusing a tendon or muscle. This is common.  Age-related wear and tear.  Injury.  Inflammatory conditions, such as arthritis.  Certain medicines. What increases the risk? This condition is more likely to develop in people who do activities that involve repetitive motions. What are the signs or symptoms? Symptoms of this condition may include:  Pain.  Tenderness.  Mild swelling. How is this diagnosed? This condition is diagnosed with a physical exam. You may also have tests, such as:  Ultrasound. This uses sound waves to make an image of your affected area.  MRI. How is this treated? This condition may be treated by resting, icing, applying pressure (compression), and raising (elevating) the area above the level of your heart. This is known as RICE therapy. Treatment may also include:  Medicines to help reduce inflammation or to help reduce pain.  Exercises or physical therapy to strengthen and stretch the tendon.  A brace or splint.  Surgery (rare). Follow these instructions at home:   If you have a splint or brace:  Wear the splint or brace as told by your health care provider. Remove it only as told by your health care provider.  Loosen the splint or brace if your fingers or toes tingle, become numb, or turn cold and blue.  Do not take baths, swim, or use a hot tub until your health care provider approves. Ask your health care provider if you  can take showers. You may only be allowed to take sponge baths for bathing.  Do not let your splint or brace get wet if it is not waterproof.  If your splint or brace is not waterproof, cover it with a watertight plastic bag when you take a bath or a shower.  Keep the splint or brace clean. Managing pain, stiffness, and swelling  If directed, apply ice to the affected area.  Put ice in a plastic bag.  Place a towel between your skin and the bag.  Leave the ice on for 20 minutes, 2-3 times a day.  If directed, apply heat to the affected area as often as told by your health care provider. Use the heat source that your health care provider recommends, such as a moist heat pack or a heating pad.  Place a towel between your skin and the heat source.  Leave the heat on for 20-30 minutes.  Remove the heat if your skin turns bright red. This is especially important if you are unable to feel pain, heat, or cold. You may have a greater risk of getting burned.  Move the fingers or toes of the affected limb often, if this applies. This can help to prevent stiffness and lessen swelling.  If directed, elevate the affected area above the level of your heart while you are sitting or lying down. Driving  Do not drive or operate heavy machinery while taking prescription pain medicine.  Ask your health care provider when it is safe to  drive if you have a splint or brace on any part of your arm or leg. Activity  Return to your normal activities as told by your health care provider. Ask your health care provider what activities are safe for you.  Rest the affected area as told by your health care provider.  Avoid using the affected area while you are experiencing symptoms of tendinitis.  Do exercises as told by your health care provider. General instructions  If you have a splint, do not put pressure on any part of the splint until it is fully hardened. This may take several hours.  Wear an  elastic bandage or compression wrap only as told by your health care provider.  Take over-the-counter and prescription medicines only as told by your health care provider.  Keep all follow-up visits as told by your health care provider. This is important. Contact a health care provider if:  Your symptoms do not improve.  You develop new, unexplained problems, such as numbness in your hands. This information is not intended to replace advice given to you by your health care provider. Make sure you discuss any questions you have with your health care provider. Document Released: 01/27/2000 Document Revised: 09/29/2015 Document Reviewed: 11/01/2014 Elsevier Interactive Patient Education  2017 [employer].

## 2016-01-17 LAB — HEPATIC FUNCTION PANEL
ALT: 33 U/L — AB (ref 6–29)
AST: 18 U/L (ref 10–35)
Albumin: 4 g/dL (ref 3.6–5.1)
Alkaline Phosphatase: 60 U/L (ref 33–130)
BILIRUBIN DIRECT: 0.1 mg/dL (ref <=0.2)
BILIRUBIN INDIRECT: 0.3 mg/dL (ref 0.2–1.2)
BILIRUBIN TOTAL: 0.4 mg/dL (ref 0.2–1.2)
Total Protein: 6.4 g/dL (ref 6.1–8.1)

## 2016-01-17 LAB — MAGNESIUM: MAGNESIUM: 2 mg/dL (ref 1.5–2.5)

## 2016-01-19 DIAGNOSIS — E559 Vitamin D deficiency, unspecified: Secondary | ICD-10-CM | POA: Diagnosis not present

## 2016-01-19 DIAGNOSIS — E038 Other specified hypothyroidism: Secondary | ICD-10-CM | POA: Diagnosis not present

## 2016-01-19 DIAGNOSIS — E7800 Pure hypercholesterolemia, unspecified: Secondary | ICD-10-CM | POA: Diagnosis not present

## 2016-01-19 DIAGNOSIS — R7301 Impaired fasting glucose: Secondary | ICD-10-CM | POA: Diagnosis not present

## 2016-01-19 DIAGNOSIS — R5382 Chronic fatigue, unspecified: Secondary | ICD-10-CM | POA: Diagnosis not present

## 2016-01-19 DIAGNOSIS — L659 Nonscarring hair loss, unspecified: Secondary | ICD-10-CM | POA: Diagnosis not present

## 2016-01-19 LAB — VITAMIN B6: Vitamin B6: 9.8 ng/mL (ref 2.1–21.7)

## 2016-01-27 ENCOUNTER — Ambulatory Visit: Payer: Medicare Other | Admitting: Family Medicine

## 2016-01-30 ENCOUNTER — Ambulatory Visit (INDEPENDENT_AMBULATORY_CARE_PROVIDER_SITE_OTHER): Payer: Medicare Other

## 2016-01-30 ENCOUNTER — Ambulatory Visit (INDEPENDENT_AMBULATORY_CARE_PROVIDER_SITE_OTHER): Payer: Medicare Other | Admitting: Sports Medicine

## 2016-01-30 ENCOUNTER — Encounter: Payer: Self-pay | Admitting: Sports Medicine

## 2016-01-30 DIAGNOSIS — J4541 Moderate persistent asthma with (acute) exacerbation: Secondary | ICD-10-CM

## 2016-01-30 DIAGNOSIS — R062 Wheezing: Secondary | ICD-10-CM | POA: Diagnosis not present

## 2016-01-30 DIAGNOSIS — R0989 Other specified symptoms and signs involving the circulatory and respiratory systems: Secondary | ICD-10-CM

## 2016-01-30 MED ORDER — AZITHROMYCIN 250 MG PO TABS: ORAL_TABLET | ORAL | 0 refills | Status: DC

## 2016-01-30 MED ORDER — PREDNISONE 50 MG PO TABS: ORAL_TABLET | ORAL | 0 refills | Status: DC

## 2016-01-30 NOTE — Assessment & Plan Note (Signed)
Currently in acute exacerbation, continue Pulmicort. Adding prednisone, azithromycin, chest x-ray.  Return in one week if no better.

## 2016-01-30 NOTE — Progress Notes (Signed)
  Subjective:    CC: Coughing  HPI: This is a pleasant 70 year old female with asthma, currently on Pulmicort. For the past couple of weeks she's had increasing cough, wheeze, minimally productive with malaise. No headaches or visual changes, chest pain, no rash, no GI symptoms.  Past medical history:  Negative.  See flowsheet/record as well for more information.  Surgical history: Negative.  See flowsheet/record as well for more information.  Family history: Negative.  See flowsheet/record as well for more information.  Social history: Negative.  See flowsheet/record as well for more information.  Allergies, and medications have been entered into the medical record, reviewed, and no changes needed.   Review of Systems: No fevers, chills, night sweats, weight loss, chest pain, or shortness of breath.   Objective:    General: Well Developed, well nourished, and in no acute distress.  Neuro: Alert and oriented x3, extra-ocular muscles intact, sensation grossly intact.  HEENT: Normocephalic, atraumatic, pupils equal round reactive to light, neck supple, no masses, no lymphadenopathy, thyroid nonpalpable.  Skin: Warm and dry, no rashes. Cardiac: Regular rate and rhythm, no murmurs rubs or gallops, no lower extremity edema.  Respiratory: Breath sounds are coarse throughout with inspiratory and expiratory wheezes. Not using accessory muscles, speaking in full sentences.  Impression and Recommendations:    Asthma with allergic rhinitis Currently in acute exacerbation, continue Pulmicort. Adding prednisone, azithromycin, chest x-ray.  Return in one week if no better.  I spent 25 minutes with this patient, greater than 50% was face-to-face time counseling regarding the above diagnoses

## 2016-02-03 ENCOUNTER — Telehealth: Payer: Self-pay

## 2016-02-03 NOTE — Telephone Encounter (Signed)
Pt left VM stating she was starting to feel better but this morning feels like she was hit by a bus and her temperature is 100.9. Would like to know what she should do at this point.

## 2016-02-03 NOTE — Telephone Encounter (Signed)
Tylenol 650 mg 3 times a day and ibuprofen 800 mg 3 times a day, may combine these 2 together. Symptoms will break.

## 2016-02-04 ENCOUNTER — Encounter: Payer: Self-pay | Admitting: Emergency Medicine

## 2016-02-04 ENCOUNTER — Emergency Department (INDEPENDENT_AMBULATORY_CARE_PROVIDER_SITE_OTHER)
Admission: EM | Admit: 2016-02-04 | Discharge: 2016-02-04 | Disposition: A | Discharge status: Home / Self Care | Payer: Medicare Other | Source: Home / Self Care | Attending: Family Medicine | Admitting: Family Medicine

## 2016-02-04 DIAGNOSIS — J101 Influenza due to other identified influenza virus with other respiratory manifestations: Secondary | ICD-10-CM

## 2016-02-04 LAB — POCT CBC W AUTO DIFF (K'VILLE URGENT CARE)

## 2016-02-04 LAB — POCT INFLUENZA A/B: INFLUENZA A, POC: POSITIVE — AB

## 2016-02-04 MED ORDER — OSELTAMIVIR PHOSPHATE 75 MG PO CAPS: 75.0000 mg | ORAL_CAPSULE | Freq: Two times a day (BID) | ORAL | 0 refills | Status: DC

## 2016-02-04 NOTE — Discharge Instructions (Signed)
Continue plain guaifenesin (1200mg  extended release tabs such as Mucinex) twice daily, with plenty of water, for cough and congestion.   May continue Tessalon perles for cough at bedtime. If symptoms become significantly worse during the night or over the weekend, proceed to the local emergency room.

## 2016-02-04 NOTE — ED Provider Notes (Signed)
[name]    CSN: [id number] Arrival date & time: 02/04/16  1306     History   Chief Complaint Chief Complaint  Patient presents with  . Cough    HPI [name] is a 70 y.o. female.   Patient experienced an asthma exacerbation five days ago and was prescribed prednisone and a Z-pak.  Her chest X-ray at that time showed no acute changes. She reports that yesterday she became acutely worse with development of fatigue, myalgias, and fever to 102.9.  She confides that her daughter developed increased fever about 4 days ago.  She denies pleuritic pain.  Her cough has become more productive.    The history is provided by the patient.    Past Medical History:  Diagnosis Date  . Allergic rhinitis, cause unspecified   . Anxiety   . Benign paroxysmal positional vertigo   . Cancer ([place])    basal cell carcinoma per right side of nostril  . Depressive disorder, not elsewhere classified   . Diabetes mellitus without complication ([place])    noted per H&P with Dr Dianah Field 08/03/2014   . Esophageal reflux   . History of hiatal hernia   . Hypertension   . Localized osteoarthrosis not specified whether primary or secondary, lower leg   . Other and unspecified hyperlipidemia   . Other diseases of vocal cords   . Peptic ulcer, unspecified site, unspecified as acute or chronic, without mention of hemorrhage, perforation, or obstruction   . Pneumonia    bilat pneumonia 1987  . Shortness of breath dyspnea    with anxiety; with climbing stairs  . TMJ (temporomandibular joint disorder)   . Unspecified arthropathy, hand   . Unspecified asthma(493.90)    triggered with a virus   . Unspecified hypothyroidism     Patient Active Problem List   Diagnosis Date Noted  . Tendon nodule 01/16/2016  . Venous stasis ulcer of left lower extremity ([place]) 03/03/2015  . S/P right THA, AA 08/10/2014  . Primary osteoarthritis of right hip 05/27/2014  . Essential hypertension,  benign 05/26/2014  . IFG (impaired fasting glucose) 01/25/2014  . Dyspnea 06/26/2013  . Vitreous detachment 09/23/2012  . Oral herpes 06/23/2012  . OCD (obsessive compulsive disorder) 05/09/2012  . Atypical ductal hyperplasia of breast 10/29/2011  . Abnormal mammogram with microcalcification 10/04/2011  . Obesity (BMI 30-39.9) 05/03/2011  . CARRIER/SUSPECTED CARRIER GROUP B STREPTOCOCCUS 11/17/2010  . Abnormal liver enzymes 10/25/2010  . SOMNOLENCE 11/25/2009  . Rheumatoid arthritis with rheumatoid factor ([place]) 11/30/2008  . POSTMENOPAUSAL STATUS 11/30/2008  . ARTHRITIS, HANDS, BILATERAL 12/22/2007  . LOC OSTEOARTHROS NOT SPEC PRIM/SEC LOWER LEG 03/19/2007  . Allergic rhinitis 11/05/2006  . VOCAL CORD DISORDER 11/05/2006  . Asthma with allergic rhinitis 11/05/2006  . Hypothyroidism 09/24/2006  . DEPRESSION 09/24/2006  . VERTIGO, BENIGN PAROXYSMAL POSITION 09/24/2006  . GERD 09/24/2006  . HYPERLIPIDEMIA 09/23/2006    Past Surgical History:  Procedure Laterality Date  . BREAST SURGERY     LUMPECTOMY / LEFT 10/12/2011  . KNEE ARTHROSCOPY     bilat   . NASAL SINUS SURGERY    . ROTATOR CUFF REPAIR     right   . thumb surgery      left hand 45 years ago   . TOTAL HIP ARTHROPLASTY Right 08/10/2014   Procedure: RIGHT TOTAL HIP ARTHROPLASTY ANTERIOR APPROACH;  Surgeon: Paralee Cancel, MD;  Location: [facility];  Service: Orthopedics;  Laterality: Right;    OB History  No data available       Home Medications    Prior to Admission medications   Medication Sig Start Date End Date Taking? Authorizing Provider  albuterol (PROVENTIL HFA) 108 (90 Base) MCG/ACT inhaler Inhale 2 puffs into the lungs every 6 (six) hours as needed for shortness of breath. 04/14/15   Collene Gobble, MD  azithromycin (ZITHROMAX Z-PAK) 250 MG tablet Take 2 tablets (500 mg) on  Day 1,  followed by 1 tablet (250 mg) once daily on Days 2 through 5. 01/30/16   Silverio Decamp, MD  Biotin (BIOTIN 5000) 5 MG  CAPS Take 1 capsule by mouth daily.      Historical Provider, MD  buPROPion (WELLBUTRIN XL) 150 MG 24 hr tablet Take 150 mg by mouth daily.      Historical Provider, MD  Calcium Carbonate-Vit D-Min 600-400 MG-UNIT TABS Take 1 tablet by mouth 2 (two) times daily.     Historical Provider, MD  Cholecalciferol (VITAMIN D3) 2000 UNITS TABS Take 2,000 Units by mouth daily.     Historical Provider, MD  clindamycin (CLEOCIN T) 1 % lotion Apply 1 application topically 2 (two) times daily as needed (bumps).  01/16/13   Hali Marry, MD  clonazePAM (KLONOPIN) 0.5 MG tablet Take 0.125-0.25 mg by mouth daily as needed for anxiety.  06/01/10   Historical Provider, MD  fexofenadine (ALLEGRA) 180 MG tablet Take 180 mg by mouth at bedtime.    Historical Provider, MD  fluticasone (FLONASE) 50 MCG/ACT nasal spray USE 2 SPRAYS IN EACH NOSTRIL EVERY DAY 12/05/15   Collene Gobble, MD  folic acid (FOLVITE) 1 MG tablet Take 1 mg by mouth 2 (two) times daily.      Historical Provider, MD  Ginkgo Biloba 40 MG TABS Take 1 tablet by mouth 3 (three) times daily.     Historical Provider, MD  levothyroxine (SYNTHROID, LEVOTHROID) 75 MCG tablet Take 75 mcg by mouth daily. Except Sunday 88 mcg    Historical Provider, MD  levothyroxine (SYNTHROID, LEVOTHROID) 88 MCG tablet Take only sunday     Historical Provider, MD  losartan-hydrochlorothiazide (HYZAAR) 100-25 MG tablet Take 1 tablet by mouth daily. 12/13/15   Hali Marry, MD  Lysine 500 MG TABS Take 1 tablet by mouth daily.    Historical Provider, MD  meloxicam (MOBIC) 15 MG tablet TAKE 1 TABLET BY MOUTH EVERY MORNING 10/21/15   Hali Marry, MD  metroNIDAZOLE (METROGEL) 1 % gel APPLY EVERY DAY 10/04/15   Hali Marry, MD  minoxidil (ROGAINE) 2 % external solution Apply 1 application topically 2 (two) times daily.    Historical Provider, MD  Multiple Vitamins-Minerals (CENTRUM SILVER ULTRA WOMENS PO) Take 1 capsule by mouth daily.    Historical  Provider, MD  oseltamivir (TAMIFLU) 75 MG capsule Take 1 capsule (75 mg total) by mouth every 12 (twelve) hours. 02/04/16   Kandra Nicolas, MD  pantoprazole (PROTONIX) 40 MG tablet TAKE 1 TABLET (40 MG TOTAL) BY MOUTH 2 (TWO) TIMES DAILY. 12/13/15   Hali Marry, MD  PARoxetine (PAXIL-CR) 12.5 MG 24 hr tablet Take 12.5 mg by mouth 2 (two) times daily.     Historical Provider, MD  predniSONE (DELTASONE) 50 MG tablet One tab PO daily for 5 days. 01/30/16   Silverio Decamp, MD  PULMICORT FLEXHALER 180 MCG/ACT inhaler INHALE 2 PUFFS INTO THE LUNGS DAILY. 08/30/15   Collene Gobble, MD  Saline (SIMPLY SALINE) 0.9 % AERS Place 1 spray  into the nose 2 (two) times daily as needed (dry).    Historical Provider, MD    Family History Family History  Problem Relation Age of Onset  . Heart disease Father   . Colon cancer Mother   . Liver cancer Brother   . Asthma Brother     Social History Social History  Substance Use Topics  . Smoking status: Former Smoker    Packs/day: 0.30    Years: 2.00    Types: Cigarettes    Quit date: 02/12/1958  . Smokeless tobacco: Never Used  . Alcohol use Yes     Comment: wine socially     Allergies   Astelin [azelastine hcl]; Lisinopril; Morphine; and Moxifloxacin   Review of Systems Review of Systems No sore throat + cough No pleuritic pain + wheezing + nasal congestion + post-nasal drainage No sinus pain/pressure No itchy/red eyes No earache No hemoptysis No SOB + fever, + chills No nausea No vomiting No abdominal pain No diarrhea No urinary symptoms No skin rash + fatigue + myalgias + headache Used OTC meds without relief   Physical Exam Triage Vital Signs ED Triage Vitals  Enc Vitals Group     BP 02/04/16 1409 123/74     Pulse Rate 02/04/16 1409 78     Resp --      Temp 02/04/16 1409 98.6 F (37 C)     Temp Source 02/04/16 1409 Oral     SpO2 02/04/16 1409 94 %     Weight 02/04/16 1410 183 lb 8 oz (83.2 kg)      Height 02/04/16 1410 5\' 3"  (1.6 m)     Head Circumference --      Peak Flow --      Pain Score 02/04/16 1412 0     Pain Loc --      Pain Edu? --      Excl. in [place]? --    No data found.   Updated Vital Signs BP 123/74 (BP Location: Left Arm)   Pulse 78   Temp 98.6 F (37 C) (Oral)   Ht 5\' 3"  (1.6 m)   Wt 183 lb 8 oz (83.2 kg)   SpO2 94%   BMI 32.51 kg/m   Visual Acuity Right Eye Distance:   Left Eye Distance:   Bilateral Distance:    Right Eye Near:   Left Eye Near:    Bilateral Near:     Physical Exam Nursing notes and Vital Signs reviewed. Appearance:  Patient appears stated age, and in no acute distress Eyes:  Pupils are equal, round, and reactive to light and accomodation.  Extraocular movement is intact.  Conjunctivae are not inflamed  Ears:  Canals normal.  Tympanic membranes normal.  Nose:  Mildly congested turbinates.  No sinus tenderness.   Pharynx:  Normal Neck:  Supple.  Tender enlarged posterior/lateral nodes are palpated bilaterally  Lungs:  Scattered expiratory wheezes.  Breath sounds are equal.  Moving air well.  No respiratory distress. Heart:  Regular rate and rhythm without murmurs, rubs, or gallops.  Abdomen:  Nontender without masses or hepatosplenomegaly.  Bowel sounds are present.  No CVA or flank tenderness.  Extremities:  No edema.  Skin:  No rash present.    UC Treatments / Results  Labs (all labs ordered are listed, but only abnormal results are displayed) Labs Reviewed  POCT INFLUENZA A/B - Abnormal; Notable for the following:       Result Value   Influenza A, POC  Positive (*)    All other components within normal limits  POCT CBC W AUTO DIFF ([facility]):  WBC 7.3; LY 11.8; MO 10.1; GR 78.1; Hgb 12.3; Platelets 269     EKG  EKG Interpretation None       Radiology No results found.  Procedures Procedures (including critical care time)  Medications Ordered in UC Medications - No data to display   Initial  Impression / Assessment and Plan / UC Course  I have reviewed the triage vital signs and the nursing notes.  Pertinent labs & imaging results that were available during my care of the patient were reviewed by me and considered in my medical decision making (see chart for details).  Clinical Course   Normal WBC reassuring Begin Tamiflu Continue plain guaifenesin (1200mg  extended release tabs such as Mucinex) twice daily, with plenty of water, for cough and congestion.   May continue Tessalon perles for cough at bedtime. If symptoms become significantly worse during the night or over the weekend, proceed to the local emergency room.  Followup with Family Doctor in 3 days.    Final Clinical Impressions(s) / UC Diagnoses   Final diagnoses:  Influenza A    New Prescriptions New Prescriptions   OSELTAMIVIR (TAMIFLU) 75 MG CAPSULE    Take 1 capsule (75 mg total) by mouth every 12 (twelve) hours.     Kandra Nicolas, MD 02/20/16 1300

## 2016-02-04 NOTE — ED Triage Notes (Signed)
Pt was seen on Monday given Prednisone and Z-pack, productive cough, fever of 102.9 this morning, no ear pain.

## 2016-02-07 ENCOUNTER — Telehealth: Payer: Self-pay

## 2016-02-08 ENCOUNTER — Telehealth: Payer: Self-pay

## 2016-02-08 NOTE — Telephone Encounter (Signed)
Pt was seen in UC and diagnosed with Flu A and was given Tamiflu. States she's feeling better but fatigued. Advised to increase fluids and rest. No further questions or concerns at this time.

## 2016-02-08 NOTE — Telephone Encounter (Signed)
[name] called to let Dr Madilyn Fireman know she was diagnosed with Type A Flu. She doing better and fever has resolved.

## 2016-02-15 ENCOUNTER — Telehealth: Payer: Self-pay | Admitting: Family Medicine

## 2016-02-15 NOTE — Telephone Encounter (Signed)
Please call patient and check on her and see how she's doing. She is recently diagnosed with the flu and I just want to make sure that she is feeling better.  [name] Lecher, MD

## 2016-02-16 NOTE — Telephone Encounter (Signed)
Attempted to contact Pt, no answer and no VM. Will callback.

## 2016-02-16 NOTE — Telephone Encounter (Signed)
Patient called left vm that she missed a call. Called pt back read her the phone note from Dr. Jerilynn Mages and she adv that she is feeling a little better but still has bad cough and coughing up stuff. She is doing everything she can drinking ginger tea, taking vitamin C, Zinc and taking Tessalon Pearl and that seems to help if you would be able to call her in some more of that. Thanks Reubin Milan u dont have to call her back :)

## 2016-02-17 MED ORDER — BENZONATATE 200 MG PO CAPS: 200.0000 mg | ORAL_CAPSULE | Freq: Three times a day (TID) | ORAL | 0 refills | Status: DC | PRN

## 2016-02-17 NOTE — Telephone Encounter (Signed)
Patient advised.

## 2016-02-17 NOTE — Telephone Encounter (Signed)
OK, let her know refilled tessalone perles

## 2016-02-21 DIAGNOSIS — F410 Panic disorder [episodic paroxysmal anxiety] without agoraphobia: Secondary | ICD-10-CM | POA: Diagnosis not present

## 2016-02-21 DIAGNOSIS — F422 Mixed obsessional thoughts and acts: Secondary | ICD-10-CM | POA: Diagnosis not present

## 2016-02-21 DIAGNOSIS — F902 Attention-deficit hyperactivity disorder, combined type: Secondary | ICD-10-CM | POA: Diagnosis not present

## 2016-02-25 ENCOUNTER — Other Ambulatory Visit: Payer: Self-pay | Admitting: Emergency Medicine

## 2016-02-28 ENCOUNTER — Ambulatory Visit: Payer: Medicare Other | Admitting: Family Medicine

## 2016-03-05 ENCOUNTER — Ambulatory Visit: Payer: Medicare Other | Admitting: Family Medicine

## 2016-03-08 DIAGNOSIS — F902 Attention-deficit hyperactivity disorder, combined type: Secondary | ICD-10-CM | POA: Diagnosis not present

## 2016-03-08 DIAGNOSIS — F410 Panic disorder [episodic paroxysmal anxiety] without agoraphobia: Secondary | ICD-10-CM | POA: Diagnosis not present

## 2016-03-08 DIAGNOSIS — F422 Mixed obsessional thoughts and acts: Secondary | ICD-10-CM | POA: Diagnosis not present

## 2016-03-19 DIAGNOSIS — F410 Panic disorder [episodic paroxysmal anxiety] without agoraphobia: Secondary | ICD-10-CM | POA: Diagnosis not present

## 2016-03-19 DIAGNOSIS — F422 Mixed obsessional thoughts and acts: Secondary | ICD-10-CM | POA: Diagnosis not present

## 2016-03-19 DIAGNOSIS — F902 Attention-deficit hyperactivity disorder, combined type: Secondary | ICD-10-CM | POA: Diagnosis not present

## 2016-03-23 ENCOUNTER — Ambulatory Visit (INDEPENDENT_AMBULATORY_CARE_PROVIDER_SITE_OTHER): Payer: Medicare Other

## 2016-03-23 ENCOUNTER — Ambulatory Visit (INDEPENDENT_AMBULATORY_CARE_PROVIDER_SITE_OTHER): Payer: Medicare Other | Admitting: Family Medicine

## 2016-03-23 ENCOUNTER — Encounter: Payer: Self-pay | Admitting: Family Medicine

## 2016-03-23 VITALS — BP 137/68 | HR 84 | Temp 97.8°F | Ht 63.0 in | Wt 187.0 lb

## 2016-03-23 DIAGNOSIS — R05 Cough: Secondary | ICD-10-CM

## 2016-03-23 DIAGNOSIS — J4541 Moderate persistent asthma with (acute) exacerbation: Secondary | ICD-10-CM

## 2016-03-23 DIAGNOSIS — R059 Cough, unspecified: Secondary | ICD-10-CM

## 2016-03-23 DIAGNOSIS — R0602 Shortness of breath: Secondary | ICD-10-CM

## 2016-03-23 MED ORDER — BUDESONIDE-FORMOTEROL FUMARATE 80-4.5 MCG/ACT IN AERO: 2.0000 | INHALATION_SPRAY | Freq: Two times a day (BID) | RESPIRATORY_TRACT | 3 refills | Status: DC

## 2016-03-23 MED ORDER — PREDNISONE 10 MG PO TABS: 10.0000 mg | ORAL_TABLET | Freq: Every day | ORAL | 0 refills | Status: DC

## 2016-03-23 MED ORDER — ALBUTEROL SULFATE (2.5 MG/3ML) 0.083% IN NEBU
2.5000 mg | INHALATION_SOLUTION | Freq: Once | RESPIRATORY_TRACT | Status: AC
  Administered 2016-03-23: 2.5 mg via RESPIRATORY_TRACT

## 2016-03-23 NOTE — Patient Instructions (Signed)
Stop Pulmicort and start Symbicort.

## 2016-03-23 NOTE — Progress Notes (Signed)
Subjective:    CC: Cough since Mid-November.   HPI:  71 year old female comes in today complaining of cough. She said she actually started feeling sick back in November. She was diagnosed with an asthma exacerbation and then in December was actually diagnosed with the flu. She said she's had a persistent cough since then. She's also been having some intermittent fevers. She says are not daily. The last fever was about 2 days ago and reports it was 100.1. She reports that she has a productive cough with green sputum. No sore throat. She's mostly been using Tessalon Perles and she's added had an outbreak of cold sores on her nose and lips.  Past medical history, Surgical history, Family history not pertinant except as noted below, Social history, Allergies, and medications have been entered into the medical record, reviewed, and corrections made.   Review of Systems: No fevers, chills, night sweats, weight loss, chest pain, or shortness of breath.   Objective:    General: Well Developed, well nourished, and in no acute distress.  Neuro: Alert and oriented x3, extra-ocular muscles intact, sensation grossly intact.  HEENT: Normocephalic, atraumatic  Skin: Warm and dry, no rashes. Cardiac: Regular rate and rhythm, no murmurs rubs or gallops, no lower extremity edema.  Respiratory: Inspect or and expiratory wheezing. No crackles.. Not using accessory muscles, speaking in full sentences.   Impression and Recommendations:    Cough 2 months-she certainly has had some initial respiratory illnesses that likely have exacerbated her underlying asthma. And then a switch her Pulmicort to Symbicort. New prescription sent to the pharmacy. And we'll get a chest x-ray today since she does have some transitory and expiratory wheezing.

## 2016-03-26 DIAGNOSIS — F410 Panic disorder [episodic paroxysmal anxiety] without agoraphobia: Secondary | ICD-10-CM | POA: Diagnosis not present

## 2016-03-26 DIAGNOSIS — F422 Mixed obsessional thoughts and acts: Secondary | ICD-10-CM | POA: Diagnosis not present

## 2016-03-26 DIAGNOSIS — F902 Attention-deficit hyperactivity disorder, combined type: Secondary | ICD-10-CM | POA: Diagnosis not present

## 2016-04-04 DIAGNOSIS — F410 Panic disorder [episodic paroxysmal anxiety] without agoraphobia: Secondary | ICD-10-CM | POA: Diagnosis not present

## 2016-04-04 DIAGNOSIS — F422 Mixed obsessional thoughts and acts: Secondary | ICD-10-CM | POA: Diagnosis not present

## 2016-04-04 DIAGNOSIS — F902 Attention-deficit hyperactivity disorder, combined type: Secondary | ICD-10-CM | POA: Diagnosis not present

## 2016-04-13 ENCOUNTER — Other Ambulatory Visit: Payer: Self-pay | Admitting: Family Medicine

## 2016-04-17 ENCOUNTER — Ambulatory Visit: Payer: Medicare Other | Admitting: Family Medicine

## 2016-04-17 DIAGNOSIS — F410 Panic disorder [episodic paroxysmal anxiety] without agoraphobia: Secondary | ICD-10-CM | POA: Diagnosis not present

## 2016-04-17 DIAGNOSIS — F902 Attention-deficit hyperactivity disorder, combined type: Secondary | ICD-10-CM | POA: Diagnosis not present

## 2016-04-17 DIAGNOSIS — F422 Mixed obsessional thoughts and acts: Secondary | ICD-10-CM | POA: Diagnosis not present

## 2016-04-19 ENCOUNTER — Ambulatory Visit (INDEPENDENT_AMBULATORY_CARE_PROVIDER_SITE_OTHER): Payer: Medicare Other | Admitting: Family Medicine

## 2016-04-19 ENCOUNTER — Encounter: Payer: Self-pay | Admitting: Family Medicine

## 2016-04-19 VITALS — BP 139/67 | HR 77 | Ht 63.0 in | Wt 186.0 lb

## 2016-04-19 DIAGNOSIS — H5712 Ocular pain, left eye: Secondary | ICD-10-CM | POA: Diagnosis not present

## 2016-04-19 DIAGNOSIS — J4541 Moderate persistent asthma with (acute) exacerbation: Secondary | ICD-10-CM

## 2016-04-19 NOTE — Progress Notes (Signed)
   Subjective:    Patient ID: [name], female    DOB: 04/12/1945, 71 y.o.   MRN: [id number]  HPI Here today to follow-up for cough. I last saw her about 4 weeks ago. At that time she had had several respiratory illnesses that likely exacerbated her underlying asthma. We decided to switch her Pulmicort to Symbicort and she's actually been doing really well. They recently she's noticed she was starting to get a little heart racing so she went ahead and switched back to her Pulmicort. She's doing 2 puffs in the morning but not twice a day.  Does like her wheezing has been much better.    She would also like to check her left eye today. About 3 days ago she woke up in the morning and rubbed her eyes which she doesn't normally do that just got a little irritated and itchy. Since then it just feels like there something in her eye almost like a bit of makeup or mascara she just wants to make sure that it's okay. Denies any fevers chills or drainage from the eye. Does use saline eyedrops.  Review of Systems     Objective:   Physical Exam  Constitutional: She is oriented to person, place, and time. She appears well-developed and well-nourished.  HENT:  Head: Normocephalic and atraumatic.  "sqweak" at the end of inspiration on the left lower lungO/W clear. Fluoresceins stain dropped into the left eye and with black light exam no sign of scratch or excoriation. I do not see any drainage or crusting in the corners. The conjunctiva are mildly inflamed but it is equally inflamed in the right eye as well.  Cardiovascular: Normal rate, regular rhythm and normal heart sounds.   Pulmonary/Chest: Effort normal and breath sounds normal.  Neurological: She is alert and oriented to person, place, and time.  Skin: Skin is warm and dry.  Psychiatric: She has a normal mood and affect. Her behavior is normal.       Assessment & Plan:  Asthma-did encourage her to use her Pulmicort twice a day instead of  once a day for at least the next 6 weeks at a minimum. And still having a slight wheeze or squeak in the left lung so did encourage her to use her albuterol 2 puffs today when she gets home. She overall is feeling significantly better and feels like her cough is back to baseline. She does have an appointment coming up with a pulmonologist.  Left eye pain-normal exam with fluoresceins stain. No sign of scratch or excoriation. No sign of infection on exam. Gave her reassurance encouraged her to see how he does over the weekend and continue to use her saline eyedrops for moisture.

## 2016-04-27 ENCOUNTER — Other Ambulatory Visit: Payer: Self-pay | Admitting: Family Medicine

## 2016-04-28 ENCOUNTER — Emergency Department (INDEPENDENT_AMBULATORY_CARE_PROVIDER_SITE_OTHER)
Admission: EM | Admit: 2016-04-28 | Discharge: 2016-04-28 | Disposition: A | Discharge status: Home / Self Care | Payer: Medicare Other | Source: Home / Self Care | Attending: Family Medicine | Admitting: Family Medicine

## 2016-04-28 ENCOUNTER — Encounter: Payer: Self-pay | Admitting: Emergency Medicine

## 2016-04-28 DIAGNOSIS — J111 Influenza due to unidentified influenza virus with other respiratory manifestations: Secondary | ICD-10-CM

## 2016-04-28 DIAGNOSIS — R69 Illness, unspecified: Secondary | ICD-10-CM

## 2016-04-28 MED ORDER — PREDNISONE 20 MG PO TABS: ORAL_TABLET | ORAL | 0 refills | Status: DC

## 2016-04-28 MED ORDER — OSELTAMIVIR PHOSPHATE 75 MG PO CAPS: 75.0000 mg | ORAL_CAPSULE | Freq: Two times a day (BID) | ORAL | 0 refills | Status: DC

## 2016-04-28 NOTE — ED Triage Notes (Signed)
Patient states she has been generally aching and fatigued with fever; took tylenol at 1:30pm today. States she had influenza type A in 12/18. Did not have flu vaccination this season.

## 2016-04-28 NOTE — Discharge Instructions (Signed)
Take plain guaifenesin (1200mg  extended release tabs such as Mucinex) twice daily, with plenty of water, for cough and congestion.  Get adequate rest.   Continue saline nasal spray.  Continue Pulmicort and albuterol inhalers. Try warm salt water gargles for sore throat.  Stop all antihistamines for now, and other non-prescription cough/cold preparations.

## 2016-04-28 NOTE — ED Provider Notes (Signed)
[name]    CSN: [id number] Arrival date & time: 04/28/16  1619     History   Chief Complaint Chief Complaint  Patient presents with  . Generalized Body Aches  . Fever    HPI [name] is a 71 y.o. female.   Complains of 2 day history flu-like illness including myalgias, headache, fever (100.9)/chills, fatigue, and cough.  Also has mild nasal congestion and sore throat.  Cough is non-productive and somewhat worse at night.  No pleuritic pain or shortness of breath.  She reports some loose stools.   The history is provided by the patient.    Past Medical History:  Diagnosis Date  . Allergic rhinitis, cause unspecified   . Anxiety   . Benign paroxysmal positional vertigo   . Cancer ([place])    basal cell carcinoma per right side of nostril  . Depressive disorder, not elsewhere classified   . Diabetes mellitus without complication ([place])    noted per H&P with Dr Dianah Field 08/03/2014   . Esophageal reflux   . History of hiatal hernia   . Hypertension   . Localized osteoarthrosis not specified whether primary or secondary, lower leg   . Other and unspecified hyperlipidemia   . Other diseases of vocal cords   . Peptic ulcer, unspecified site, unspecified as acute or chronic, without mention of hemorrhage, perforation, or obstruction   . Pneumonia    bilat pneumonia 1987  . Shortness of breath dyspnea    with anxiety; with climbing stairs  . TMJ (temporomandibular joint disorder)   . Unspecified arthropathy, hand   . Unspecified asthma(493.90)    triggered with a virus   . Unspecified hypothyroidism     Patient Active Problem List   Diagnosis Date Noted  . Tendon nodule 01/16/2016  . Venous stasis ulcer of left lower extremity ([place]) 03/03/2015  . S/P right THA, AA 08/10/2014  . Primary osteoarthritis of right hip 05/27/2014  . Essential hypertension, benign 05/26/2014  . IFG (impaired fasting glucose) 01/25/2014  . Dyspnea 06/26/2013  .  Vitreous detachment 09/23/2012  . Oral herpes 06/23/2012  . OCD (obsessive compulsive disorder) 05/09/2012  . Atypical ductal hyperplasia of breast 10/29/2011  . Abnormal mammogram with microcalcification 10/04/2011  . Obesity (BMI 30-39.9) 05/03/2011  . CARRIER/SUSPECTED CARRIER GROUP B STREPTOCOCCUS 11/17/2010  . Abnormal liver enzymes 10/25/2010  . SOMNOLENCE 11/25/2009  . Rheumatoid arthritis with rheumatoid factor ([place]) 11/30/2008  . POSTMENOPAUSAL STATUS 11/30/2008  . ARTHRITIS, HANDS, BILATERAL 12/22/2007  . LOC OSTEOARTHROS NOT SPEC PRIM/SEC LOWER LEG 03/19/2007  . Allergic rhinitis 11/05/2006  . VOCAL CORD DISORDER 11/05/2006  . Asthma with allergic rhinitis 11/05/2006  . Hypothyroidism 09/24/2006  . DEPRESSION 09/24/2006  . VERTIGO, BENIGN PAROXYSMAL POSITION 09/24/2006  . GERD 09/24/2006  . HYPERLIPIDEMIA 09/23/2006    Past Surgical History:  Procedure Laterality Date  . BREAST SURGERY     LUMPECTOMY / LEFT 10/12/2011  . KNEE ARTHROSCOPY     bilat   . NASAL SINUS SURGERY    . ROTATOR CUFF REPAIR     right   . thumb surgery      left hand 45 years ago   . TOTAL HIP ARTHROPLASTY Right 08/10/2014   Procedure: RIGHT TOTAL HIP ARTHROPLASTY ANTERIOR APPROACH;  Surgeon: Paralee Cancel, MD;  Location: [facility];  Service: Orthopedics;  Laterality: Right;    OB History    No data available       Home Medications    Prior to Admission  medications   Medication Sig Start Date End Date Taking? Authorizing Provider  albuterol (PROVENTIL HFA) 108 (90 Base) MCG/ACT inhaler Inhale 2 puffs into the lungs every 6 (six) hours as needed for shortness of breath. 04/14/15   Collene Gobble, MD  Biotin (BIOTIN 5000) 5 MG CAPS Take 1 capsule by mouth daily.      Historical Provider, MD  buPROPion (WELLBUTRIN XL) 150 MG 24 hr tablet Take 150 mg by mouth daily.      Historical Provider, MD  Calcium Carbonate-Vit D-Min 600-400 MG-UNIT TABS Take 1 tablet by mouth 2 (two) times daily.      Historical Provider, MD  Cholecalciferol (VITAMIN D3) 2000 UNITS TABS Take 2,000 Units by mouth daily.     Historical Provider, MD  clindamycin (CLEOCIN T) 1 % lotion Apply 1 application topically 2 (two) times daily as needed (bumps).  01/16/13   Hali Marry, MD  fexofenadine (ALLEGRA) 180 MG tablet Take 180 mg by mouth at bedtime.    Historical Provider, MD  fluticasone (FLONASE) 50 MCG/ACT nasal spray USE 2 SPRAYS IN EACH NOSTRIL EVERY DAY 12/05/15   Collene Gobble, MD  folic acid (FOLVITE) 1 MG tablet Take 1 mg by mouth 2 (two) times daily.      Historical Provider, MD  Ginkgo Biloba 40 MG TABS Take 1 tablet by mouth 3 (three) times daily.     Historical Provider, MD  levothyroxine (SYNTHROID, LEVOTHROID) 75 MCG tablet Take 75 mcg by mouth daily. Except Sunday 88 mcg    Historical Provider, MD  levothyroxine (SYNTHROID, LEVOTHROID) 88 MCG tablet Take only sunday     Historical Provider, MD  losartan-hydrochlorothiazide (HYZAAR) 100-25 MG tablet TAKE 1 TABLET BY MOUTH DAILY. 04/27/16   Hali Marry, MD  Lysine 500 MG TABS Take 1 tablet by mouth daily.    Historical Provider, MD  meloxicam (MOBIC) 15 MG tablet TAKE 1 TABLET BY MOUTH EVERY MORNING 04/17/16   Hali Marry, MD  metroNIDAZOLE (METROGEL) 1 % gel APPLY EVERY DAY 10/04/15   Hali Marry, MD  minoxidil (ROGAINE) 2 % external solution Apply 1 application topically 2 (two) times daily.    Historical Provider, MD  Multiple Vitamins-Minerals (CENTRUM SILVER ULTRA WOMENS PO) Take 1 capsule by mouth daily.    Historical Provider, MD  oseltamivir (TAMIFLU) 75 MG capsule Take 1 capsule (75 mg total) by mouth every 12 (twelve) hours. 04/28/16   Kandra Nicolas, MD  pantoprazole (PROTONIX) 40 MG tablet TAKE 1 TABLET (40 MG TOTAL) BY MOUTH 2 (TWO) TIMES DAILY. 12/13/15   Hali Marry, MD  PARoxetine (PAXIL-CR) 12.5 MG 24 hr tablet Take 12.5 mg by mouth 2 (two) times daily.     Historical Provider, MD  predniSONE  (DELTASONE) 20 MG tablet Take one tab by mouth twice daily for 4 days, then one daily. Take with food. 04/28/16   Kandra Nicolas, MD  PULMICORT FLEXHALER 180 MCG/ACT inhaler INHALE 2 PUFFS INTO THE LUNGS DAILY. 04/06/16   Historical Provider, MD  Saline (SIMPLY SALINE) 0.9 % AERS Place 1 spray into the nose 2 (two) times daily as needed (dry).    Historical Provider, MD    Family History Family History  Problem Relation Age of Onset  . Heart disease Father   . Colon cancer Mother   . Liver cancer Brother   . Asthma Brother     Social History Social History  Substance Use Topics  . Smoking status: Former Smoker  Packs/day: 0.30    Years: 2.00    Types: Cigarettes    Quit date: 02/12/1958  . Smokeless tobacco: Never Used  . Alcohol use Yes     Comment: wine socially     Allergies   Astelin [azelastine hcl]; Lisinopril; Morphine; and Moxifloxacin   Review of Systems Review of Systems  + sore throat + cough No pleuritic pain No wheezing + nasal congestion + post-nasal drainage No sinus pain/pressure No itchy/red eyes No earache No hemoptysis No SOB + fever, + chills No nausea No vomiting No abdominal pain + diarrhea No urinary symptoms No skin rash + fatigue + myalgias + headache Used OTC meds without relief    Physical Exam Triage Vital Signs ED Triage Vitals  Enc Vitals Group     BP 04/28/16 1630 112/70     Pulse Rate 04/28/16 1630 86     Resp 04/28/16 1630 18     Temp 04/28/16 1630 98.3 F (36.8 C)     Temp Source 04/28/16 1630 Oral     SpO2 04/28/16 1630 96 %     Weight 04/28/16 1631 180 lb (81.6 kg)     Height 04/28/16 1631 5\' 3"  (1.6 m)     Head Circumference --      Peak Flow --      Pain Score 04/28/16 1632 1     Pain Loc --      Pain Edu? --      Excl. in [place]? --    No data found.   Updated Vital Signs BP 112/70 (BP Location: Left Arm)   Pulse 86   Temp 98.3 F (36.8 C) (Oral)   Resp 18   Ht 5\' 3"  (1.6 m)   Wt 180 lb (81.6 kg)    SpO2 96%   BMI 31.89 kg/m   Visual Acuity Right Eye Distance:   Left Eye Distance:   Bilateral Distance:    Right Eye Near:   Left Eye Near:    Bilateral Near:     Physical Exam Nursing notes and Vital Signs reviewed. Appearance:  Patient appears stated age, and in no acute distress Eyes:  Pupils are equal, round, and reactive to light and accomodation.  Extraocular movement is intact.  Conjunctivae are not inflamed  Ears:  Canals normal.  Tympanic membranes normal.  Nose:  Mildly congested turbinates.  No sinus tenderness.   Pharynx:  Normal Neck:  Supple.  Tender enlarged posterior/lateral nodes are palpated bilaterally  Lungs:  Clear to auscultation.  Breath sounds are equal.  Moving air well. Heart:  Regular rate and rhythm without murmurs, rubs, or gallops.  Abdomen:  Nontender without masses or hepatosplenomegaly.  Bowel sounds are present.  No CVA or flank tenderness.  Extremities:  No edema.  Skin:  No rash present.    UC Treatments / Results  Labs (all labs ordered are listed, but only abnormal results are displayed) Labs Reviewed - No data to display  EKG  EKG Interpretation None       Radiology No results found.  Procedures Procedures (including critical care time)  Medications Ordered in UC Medications - No data to display   Initial Impression / Assessment and Plan / UC Course  I have reviewed the triage vital signs and the nursing notes.  Pertinent labs & imaging results that were available during my care of the patient were reviewed by me and considered in my medical decision making (see chart for details).    Begin  Tamiflu, and prednisone burst/taper. Take plain guaifenesin (1200mg  extended release tabs such as Mucinex) twice daily, with plenty of water, for cough and congestion.  Get adequate rest.   Continue saline nasal spray.  Continue Pulmicort and albuterol inhalers. Try warm salt water gargles for sore throat.  Stop all  antihistamines for now, and other non-prescription cough/cold preparations. Followup with Family Doctor if not improved in about 5 days.    Final Clinical Impressions(s) / UC Diagnoses   Final diagnoses:  Influenza-like illness    New Prescriptions New Prescriptions   OSELTAMIVIR (TAMIFLU) 75 MG CAPSULE    Take 1 capsule (75 mg total) by mouth every 12 (twelve) hours.   PREDNISONE (DELTASONE) 20 MG TABLET    Take one tab by mouth twice daily for 4 days, then one daily. Take with food.     Kandra Nicolas, MD 05/04/16 Tresa Moore

## 2016-05-18 ENCOUNTER — Ambulatory Visit (INDEPENDENT_AMBULATORY_CARE_PROVIDER_SITE_OTHER): Payer: Medicare Other | Admitting: Family Medicine

## 2016-05-18 DIAGNOSIS — N6310 Unspecified lump in the right breast, unspecified quadrant: Secondary | ICD-10-CM | POA: Diagnosis not present

## 2016-05-18 DIAGNOSIS — N6315 Unspecified lump in the right breast, overlapping quadrants: Secondary | ICD-10-CM

## 2016-05-18 DIAGNOSIS — L539 Erythematous condition, unspecified: Secondary | ICD-10-CM

## 2016-05-18 MED ORDER — DICLOXACILLIN SODIUM 500 MG PO CAPS: 500.0000 mg | ORAL_CAPSULE | Freq: Four times a day (QID) | ORAL | 0 refills | Status: DC

## 2016-05-18 NOTE — Progress Notes (Signed)
Subjective:    Patient ID: [name], female    DOB: 1945/11/13, 71 y.o.   MRN: [id number]  HPI 71 year old female comes in today complaining of a red spot just below the right breast. She noticed it on Tuesday approximately 3 days ago. She says it's really not bothersome painful, itchy or irritated. Says has been running a low grade temp esp at night for the last 3 days as well.  She's had one night she had a little bit of discomfort in her left ear and put a heating pad on it but that hasn't bothered her ever since then. She denies any trauma or injury to the area or ill fitting bra where. She's not had any other  respiratory symptoms.                         Review of Systems   There were no vitals taken for this visit.    Allergies  Allergen Reactions  . Astelin [Azelastine Hcl] Other (See Comments)    Headache  . Lisinopril     ACE cough  . Morphine     REACTION: Nausea  . Moxifloxacin     REACTION: confusion, dizziness, paranoia    Past Medical History:  Diagnosis Date  . Allergic rhinitis, cause unspecified   . Anxiety   . Benign paroxysmal positional vertigo   . Cancer ([place])    basal cell carcinoma per right side of nostril  . Depressive disorder, not elsewhere classified   . Diabetes mellitus without complication ([place])    noted per H&P with Dr Dianah Field 08/03/2014   . Esophageal reflux   . History of hiatal hernia   . Hypertension   . Localized osteoarthrosis not specified whether primary or secondary, lower leg   . Other and unspecified hyperlipidemia   . Other diseases of vocal cords   . Peptic ulcer, unspecified site, unspecified as acute or chronic, without mention of hemorrhage, perforation, or obstruction   . Pneumonia    bilat pneumonia 1987  . Shortness of breath dyspnea    with anxiety; with climbing stairs  . TMJ (temporomandibular joint disorder)   . Unspecified arthropathy, hand   . Unspecified asthma(493.90)    triggered with a virus     . Unspecified hypothyroidism     Past Surgical History:  Procedure Laterality Date  . BREAST SURGERY     LUMPECTOMY / LEFT 10/12/2011  . KNEE ARTHROSCOPY     bilat   . NASAL SINUS SURGERY    . ROTATOR CUFF REPAIR     right   . thumb surgery      left hand 45 years ago   . TOTAL HIP ARTHROPLASTY Right 08/10/2014   Procedure: RIGHT TOTAL HIP ARTHROPLASTY ANTERIOR APPROACH;  Surgeon: Paralee Cancel, MD;  Location: [facility];  Service: Orthopedics;  Laterality: Right;    Social History   Social History  . Marital status: Married    Spouse name: N/A  . Number of children: 3  . Years of education: N/A   Occupational History  . Retired    Social History Main Topics  . Smoking status: Former Smoker    Packs/day: 0.30    Years: 2.00    Types: Cigarettes    Quit date: 02/12/1958  . Smokeless tobacco: Never Used  . Alcohol use Yes     Comment: wine socially  . Drug use: No  . Sexual activity: Yes   Other Topics  Concern  . Not on file   Social History Narrative  . No narrative on file    Family History  Problem Relation Age of Onset  . Heart disease Father   . Colon cancer Mother   . Liver cancer Brother   . Asthma Brother     Outpatient Encounter Prescriptions as of 05/18/2016  Medication Sig  . albuterol (PROVENTIL HFA) 108 (90 Base) MCG/ACT inhaler Inhale 2 puffs into the lungs every 6 (six) hours as needed for shortness of breath.  . Biotin (BIOTIN 5000) 5 MG CAPS Take 1 capsule by mouth daily.    [place] Kitchen buPROPion (WELLBUTRIN XL) 150 MG 24 hr tablet Take 150 mg by mouth daily.    . Calcium Carbonate-Vit D-Min 600-400 MG-UNIT TABS Take 1 tablet by mouth 2 (two) times daily.   . Cholecalciferol (VITAMIN D3) 2000 UNITS TABS Take 2,000 Units by mouth daily.   . clindamycin (CLEOCIN T) 1 % lotion Apply 1 application topically 2 (two) times daily as needed (bumps).   . fexofenadine (ALLEGRA) 180 MG tablet Take 180 mg by mouth at bedtime.  . fluticasone (FLONASE) 50 MCG/ACT nasal  spray USE 2 SPRAYS IN EACH NOSTRIL EVERY DAY  . folic acid (FOLVITE) 1 MG tablet Take 1 mg by mouth 2 (two) times daily.    . Ginkgo Biloba 40 MG TABS Take 1 tablet by mouth 3 (three) times daily.   [place] Kitchen levothyroxine (SYNTHROID, LEVOTHROID) 75 MCG tablet Take 75 mcg by mouth daily. Except Sunday 88 mcg  . levothyroxine (SYNTHROID, LEVOTHROID) 88 MCG tablet Take only sunday   . losartan-hydrochlorothiazide (HYZAAR) 100-25 MG tablet TAKE 1 TABLET BY MOUTH DAILY.  [place] Kitchen Lysine 500 MG TABS Take 1 tablet by mouth daily.  . meloxicam (MOBIC) 15 MG tablet TAKE 1 TABLET BY MOUTH EVERY MORNING  . metroNIDAZOLE (METROGEL) 1 % gel APPLY EVERY DAY  . minoxidil (ROGAINE) 2 % external solution Apply 1 application topically 2 (two) times daily.  . Multiple Vitamins-Minerals (CENTRUM SILVER ULTRA WOMENS PO) Take 1 capsule by mouth daily.  . pantoprazole (PROTONIX) 40 MG tablet TAKE 1 TABLET (40 MG TOTAL) BY MOUTH 2 (TWO) TIMES DAILY.  [place] Kitchen PARoxetine (PAXIL-CR) 12.5 MG 24 hr tablet Take 12.5 mg by mouth 2 (two) times daily.   . predniSONE (DELTASONE) 20 MG tablet Take one tab by mouth twice daily for 4 days, then one daily. Take with food.  [place] Kitchen PULMICORT FLEXHALER 180 MCG/ACT inhaler INHALE 2 PUFFS INTO THE LUNGS DAILY.  . Saline (SIMPLY SALINE) 0.9 % AERS Place 1 spray into the nose 2 (two) times daily as needed (dry).  [place] Kitchen dicloxacillin (DYNAPEN) 500 MG capsule Take 1 capsule (500 mg total) by mouth 4 (four) times daily.  . [DISCONTINUED] oseltamivir (TAMIFLU) 75 MG capsule Take 1 capsule (75 mg total) by mouth every 12 (twelve) hours.   No facility-administered encounter medications on file as of 05/18/2016.           Objective:   Physical Exam  Constitutional: She is oriented to person, place, and time. She appears well-developed and well-nourished.  HENT:  Head: Normocephalic and atraumatic.  Eyes: Conjunctivae and EOM are normal.  Cardiovascular: Normal rate.   Pulmonary/Chest: Effort normal.     Neurological: She is alert and oriented to person, place, and time.  Skin: Skin is dry. No pallor.  Psychiatric: She has a normal mood and affect. Her behavior is normal.  Vitals reviewed.         Assessment &  Plan:  Right breast erythema with nodule-we'll go ahead and place her on dicloxacillin for possible mastitis that she has been having some fevers. We'll also go ahead and order diagnostic mammogram and ultrasound on the right breast as well. Consider could be an abscess versus sebaceous cyst versus mastitis versus an actual breast lump. She does have a prior history of atypical ductal hyperplasia in her left breast that required excision ( 10/2011) . She decided not to take Evista at that time.

## 2016-05-18 NOTE — Patient Instructions (Signed)
Call if not better after the weekend.  

## 2016-05-23 ENCOUNTER — Ambulatory Visit
Admission: RE | Admit: 2016-05-23 | Discharge: 2016-05-23 | Disposition: A | Discharge status: Home / Self Care | Payer: Medicare Other | Source: Ambulatory Visit | Attending: Family Medicine | Admitting: Family Medicine

## 2016-05-23 ENCOUNTER — Other Ambulatory Visit: Payer: Self-pay | Admitting: Family Medicine

## 2016-05-23 DIAGNOSIS — N6310 Unspecified lump in the right breast, unspecified quadrant: Secondary | ICD-10-CM

## 2016-05-23 DIAGNOSIS — N6315 Unspecified lump in the right breast, overlapping quadrants: Secondary | ICD-10-CM

## 2016-05-23 DIAGNOSIS — N6489 Other specified disorders of breast: Secondary | ICD-10-CM | POA: Diagnosis not present

## 2016-05-23 DIAGNOSIS — L539 Erythematous condition, unspecified: Secondary | ICD-10-CM

## 2016-05-23 DIAGNOSIS — R928 Other abnormal and inconclusive findings on diagnostic imaging of breast: Secondary | ICD-10-CM | POA: Diagnosis not present

## 2016-06-08 ENCOUNTER — Telehealth: Payer: Self-pay | Admitting: *Deleted

## 2016-06-08 MED ORDER — CLINDAMYCIN PHOSPHATE 1 % EX LOTN: 1.0000 "application " | TOPICAL_LOTION | Freq: Two times a day (BID) | CUTANEOUS | 1 refills | Status: DC | PRN

## 2016-06-08 NOTE — Telephone Encounter (Signed)
Rx sent 

## 2016-06-08 NOTE — Telephone Encounter (Signed)
Patient is requesting a refill on clindamycin lotion. This has not been prescribed since 2014 although the patient has just been seen in the office. She states she uses it for "clogged pores" that she gets on the forehead. Are you ok with filling this

## 2016-06-12 DIAGNOSIS — F902 Attention-deficit hyperactivity disorder, combined type: Secondary | ICD-10-CM | POA: Diagnosis not present

## 2016-06-12 DIAGNOSIS — F410 Panic disorder [episodic paroxysmal anxiety] without agoraphobia: Secondary | ICD-10-CM | POA: Diagnosis not present

## 2016-06-12 DIAGNOSIS — F422 Mixed obsessional thoughts and acts: Secondary | ICD-10-CM | POA: Diagnosis not present

## 2016-06-14 ENCOUNTER — Ambulatory Visit (INDEPENDENT_AMBULATORY_CARE_PROVIDER_SITE_OTHER): Payer: Medicare Other | Admitting: Pulmonary Disease

## 2016-06-14 ENCOUNTER — Encounter: Payer: Self-pay | Admitting: Pulmonary Disease

## 2016-06-14 DIAGNOSIS — J301 Allergic rhinitis due to pollen: Secondary | ICD-10-CM | POA: Diagnosis not present

## 2016-06-14 DIAGNOSIS — J4520 Mild intermittent asthma, uncomplicated: Secondary | ICD-10-CM

## 2016-06-14 MED ORDER — PULMICORT FLEXHALER 180 MCG/ACT IN AEPB: INHALATION_SPRAY | RESPIRATORY_TRACT | 3 refills | Status: DC

## 2016-06-14 MED ORDER — ALBUTEROL SULFATE HFA 108 (90 BASE) MCG/ACT IN AERS: 2.0000 | INHALATION_SPRAY | Freq: Four times a day (QID) | RESPIRATORY_TRACT | 4 refills | Status: DC | PRN

## 2016-06-14 NOTE — Assessment & Plan Note (Signed)
Stay on Pulmicort 2 puffs once daily-rinse mouth after use Albuterol 2 puffs as needed for wheezing

## 2016-06-14 NOTE — Addendum Note (Signed)
Addended by: Valerie Salts on: 06/14/2016 12:18 PM   Modules accepted: Orders

## 2016-06-14 NOTE — Patient Instructions (Signed)
Stay on Pulmicort 2 puffs once daily-rinse mouth after use Albuterol 2 puffs as needed for wheezing  Call us if you get sick

## 2016-06-14 NOTE — Assessment & Plan Note (Signed)
Continue Allegra daily Mucinex and Flonase on an as-needed basis

## 2016-06-14 NOTE — Progress Notes (Signed)
   Subjective:    Patient ID: [name], female    DOB: [birth year], 71 y.o.   MRN: [id number]  HPI 71 year old remote smoker for follow-up of  mild persistent asthma, allergic rhinitis and upper airway irritation syndrome with vocal cord dysfunction and chronic cough She has been followed by Dr. Joya Gaskins in the past and then by Dr byrum & now wishes to relocate to the [facility] office. She also carries a diagnosis of seronegative rheumatoid arthritis. She had a rough winter initially had influenza A and then influenza B a few weeks later. She required antibiotics and prednisone and is now back to baseline. She reports perineal rhinitis and takes Allegra and Mucinex throughout the year. She takes Flonase on an as-needed basis during the pollen season since this interferes with her sense of smell.  During flareups in the past, strategy has been to increase Pulmicort twice daily and use short burst of prednisone. She does report sinus and nasal congestion on occasion. She also has heartburn with occasional breakthrough in spite of sleeping with her head elevated and watching her diet and taking Protonix  Significant tests/ events reviewed  CXR 03/2016 bronchitic changes  Spirometry 2015 ratio 63, FEV1 normal  Review of Systems neg for any significant sore throat, dysphagia, itching, sneezing, nasal congestion or excess/ purulent secretions, fever, chills, sweats, unintended wt loss, pleuritic or exertional cp, hempoptysis, orthopnea pnd or change in chronic leg swelling. Also denies presyncope, palpitations, heartburn, abdominal pain, nausea, vomiting, diarrhea or change in bowel or urinary habits, dysuria,hematuria, rash, arthralgias, visual complaints, headache, numbness weakness or ataxia.     Objective:   Physical Exam  Gen. Pleasant, well-nourished, in no distress ENT - no thrush, no post nasal drip Neck: No JVD, no thyromegaly, no carotid bruits Lungs: no use of accessory muscles,  no dullness to percussion, clear without rales or rhonchi  Cardiovascular: Rhythm regular, heart sounds  normal, no murmurs or gallops, no peripheral edema Musculoskeletal: No deformities, no cyanosis or clubbing        Assessment & Plan:

## 2016-06-15 DIAGNOSIS — E039 Hypothyroidism, unspecified: Secondary | ICD-10-CM | POA: Diagnosis not present

## 2016-06-15 DIAGNOSIS — R5382 Chronic fatigue, unspecified: Secondary | ICD-10-CM | POA: Diagnosis not present

## 2016-06-15 DIAGNOSIS — R7301 Impaired fasting glucose: Secondary | ICD-10-CM | POA: Diagnosis not present

## 2016-06-15 DIAGNOSIS — E785 Hyperlipidemia, unspecified: Secondary | ICD-10-CM | POA: Diagnosis not present

## 2016-06-19 DIAGNOSIS — Z833 Family history of diabetes mellitus: Secondary | ICD-10-CM | POA: Diagnosis not present

## 2016-06-19 DIAGNOSIS — E559 Vitamin D deficiency, unspecified: Secondary | ICD-10-CM | POA: Insufficient documentation

## 2016-06-19 DIAGNOSIS — E039 Hypothyroidism, unspecified: Secondary | ICD-10-CM | POA: Diagnosis not present

## 2016-06-19 DIAGNOSIS — L659 Nonscarring hair loss, unspecified: Secondary | ICD-10-CM | POA: Insufficient documentation

## 2016-06-19 DIAGNOSIS — R7301 Impaired fasting glucose: Secondary | ICD-10-CM | POA: Diagnosis not present

## 2016-06-19 DIAGNOSIS — R5382 Chronic fatigue, unspecified: Secondary | ICD-10-CM | POA: Insufficient documentation

## 2016-06-19 DIAGNOSIS — E7800 Pure hypercholesterolemia, unspecified: Secondary | ICD-10-CM | POA: Diagnosis not present

## 2016-07-06 ENCOUNTER — Ambulatory Visit (INDEPENDENT_AMBULATORY_CARE_PROVIDER_SITE_OTHER): Payer: Medicare Other | Admitting: Family Medicine

## 2016-07-06 ENCOUNTER — Encounter: Payer: Self-pay | Admitting: Family Medicine

## 2016-07-06 VITALS — BP 127/63 | HR 85 | Temp 98.7°F | Wt 183.0 lb

## 2016-07-06 DIAGNOSIS — J018 Other acute sinusitis: Secondary | ICD-10-CM

## 2016-07-06 MED ORDER — AMOXICILLIN-POT CLAVULANATE 875-125 MG PO TABS: 1.0000 | ORAL_TABLET | Freq: Two times a day (BID) | ORAL | 0 refills | Status: DC

## 2016-07-06 NOTE — Progress Notes (Signed)
   Subjective:    Patient ID: [name], female    DOB: [birth year], 71 y.o.   MRN: [id number]  HPI Sinus congestion, ST, And cough for approximately 1 week. She says she really never got much better from the first time she was sick. She said the cough still lingered and eventually just started getting worse .She woke up this morning feeling worse with a very sore throat. She's had lots of post nasal drip. 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 Fevers chills or sweats. No GI upset. Her voice is raspy. She did use her Pulmicort today but not her albuterol. He reports that her sputum is mostly white.     Review of Systems     Objective:   Physical Exam  Constitutional: She is oriented to person, place, and time. She appears well-developed and well-nourished.  HENT:  Head: Normocephalic and atraumatic.  Right Ear: External ear normal.  Left Ear: External ear normal.  Nose: Nose normal.  Mouth/Throat: Oropharynx is clear and moist.  TMs and canals are clear.   Eyes: Conjunctivae and EOM are normal. Pupils are equal, round, and reactive to light.  Neck: Neck supple. No thyromegaly present.  Cardiovascular: Normal rate, regular rhythm and normal heart sounds.   Pulmonary/Chest: Effort normal. She has wheezes.  Diffuse mild expiratory wheezing.  Lymphadenopathy:    She has no cervical adenopathy.  Neurological: She is alert and oriented to person, place, and time.  Skin: Skin is warm and dry.  Psychiatric: She has a normal mood and affect.        Assessment & Plan:  Acute sinusitis-we'll treat with Augmentin. She is suppposed to take a  prophylactic dose of amoxicillin on Tuesday before dental procedure. She can skip that and just take the amoxicillin as prescribed for the sinus infection. If not improving consider chest x-ray if she still continues to have a productive cough.  Asthma-okay to use albuterol more liberally over the weekend she does have some extra wheezing on exam.  If she feels like she's getting worse then please give [place] a call. We can always add prednisone if needed.

## 2016-07-25 DIAGNOSIS — F410 Panic disorder [episodic paroxysmal anxiety] without agoraphobia: Secondary | ICD-10-CM | POA: Diagnosis not present

## 2016-07-25 DIAGNOSIS — F902 Attention-deficit hyperactivity disorder, combined type: Secondary | ICD-10-CM | POA: Diagnosis not present

## 2016-07-25 DIAGNOSIS — F422 Mixed obsessional thoughts and acts: Secondary | ICD-10-CM | POA: Diagnosis not present

## 2016-08-02 ENCOUNTER — Encounter: Payer: Self-pay | Admitting: Adult Health

## 2016-08-02 ENCOUNTER — Ambulatory Visit (INDEPENDENT_AMBULATORY_CARE_PROVIDER_SITE_OTHER): Payer: Medicare Other | Admitting: Adult Health

## 2016-08-02 DIAGNOSIS — J383 Other diseases of vocal cords: Secondary | ICD-10-CM

## 2016-08-02 DIAGNOSIS — J4520 Mild intermittent asthma, uncomplicated: Secondary | ICD-10-CM

## 2016-08-02 DIAGNOSIS — K219 Gastro-esophageal reflux disease without esophagitis: Secondary | ICD-10-CM | POA: Diagnosis not present

## 2016-08-02 MED ORDER — BENZONATATE 200 MG PO CAPS: 200.0000 mg | ORAL_CAPSULE | Freq: Three times a day (TID) | ORAL | 3 refills | Status: DC | PRN

## 2016-08-02 MED ORDER — PREDNISONE 10 MG PO TABS: ORAL_TABLET | ORAL | 0 refills | Status: DC

## 2016-08-02 NOTE — Assessment & Plan Note (Signed)
Flare with AR/GERD with VCD   Plan  Patient Instructions  Prednisone taper over next week.  Mucinex Twice daily   Begin Deslym 2 tsp Twice daily  For cough , As needed   Begin Tessalon Three times a day  For cough , As needed   Continue on Allegra daily in am .  Begin Chlortrimeton 4mg  .At bedtime   Saline nasal spray As needed   Continue on Protonix 40mg  daily before meal  Begin Pepcid 20mg  At bedtime  .  Follow up with Dr. Elsworth Soho or [place] 4 weeks.  Please contact office for sooner follow up if symptoms do not improve or worsen or seek emergency care

## 2016-08-02 NOTE — Assessment & Plan Note (Signed)
Control for triggers   Plan  Patient Instructions  Prednisone taper over next week.  Mucinex Twice daily   Begin Deslym 2 tsp Twice daily  For cough , As needed   Begin Tessalon Three times a day  For cough , As needed   Continue on Allegra daily in am .  Begin Chlortrimeton 4mg  .At bedtime   Saline nasal spray As needed   Continue on Protonix 40mg  daily before meal  Begin Pepcid 20mg  At bedtime  .  Follow up with Dr. Elsworth Soho or Ifeoluwa Beller 4 weeks.  Please contact office for sooner follow up if symptoms do not improve or worsen or seek emergency care

## 2016-08-02 NOTE — Progress Notes (Signed)
@Patient  ID: [name], female    DOB: January 26, 1946, 71 y.o.   MRN: [id number]  Chief Complaint  Patient presents with  . Follow-up    Asthma     Referring provider: Hali Marry, *  HPI: 71 yo female Followed for mild persistent asthma, allergic rhinitis and chronic cough with vocal cord dysfunction.  08/02/2016 Acute OV : Cough  Patient presents for an acute office visit . She complains over the last few weeks that her cough has been getting worse. She's been having intermittent wheezing and severe coughing episodes. She denies any discolored mucus. Cough is minimally productive. She denies any fever, chest pain, orthopnea, PND, or increased leg swelling. Patient's been taking Mucinex Allegra without much relief. She has been having increased reflux despite Protonix twice daily. Chest x-ray done in February showed no acute process.   Allergies  Allergen Reactions  . Astelin [Azelastine Hcl] Other (See Comments)    Headache  . Lisinopril     ACE cough  . Morphine     REACTION: Nausea  . Moxifloxacin     REACTION: confusion, dizziness, paranoia    Immunization History  Administered Date(s) Administered  . Influenza Split 11/07/2011, 01/12/2013  . Influenza Whole 02/12/2005, 12/02/2007, 11/24/2008  . Influenza,inj,Quad PF,36+ Mos 01/11/2015  . Influenza-Unspecified 11/12/2013  . Pneumococcal Conjugate-13 04/26/2014  . Pneumococcal Polysaccharide-23 02/12/2001, 11/27/2001, 11/28/2006, 11/07/2011  . Td 02/12/1998, 06/13/1998  . Tdap 09/23/2012    Past Medical History:  Diagnosis Date  . Allergic rhinitis, cause unspecified   . Anxiety   . Benign paroxysmal positional vertigo   . Cancer ([place])    basal cell carcinoma per right side of nostril  . Depressive disorder, not elsewhere classified   . Diabetes mellitus without complication ([place])    noted per H&P with Dr Dianah Field 08/03/2014   . Esophageal reflux   . History of hiatal hernia   . Hypertension    . Localized osteoarthrosis not specified whether primary or secondary, lower leg   . Other and unspecified hyperlipidemia   . Other diseases of vocal cords   . Peptic ulcer, unspecified site, unspecified as acute or chronic, without mention of hemorrhage, perforation, or obstruction   . Pneumonia    bilat pneumonia 1987  . Shortness of breath dyspnea    with anxiety; with climbing stairs  . TMJ (temporomandibular joint disorder)   . Unspecified arthropathy, hand   . Unspecified asthma(493.90)    triggered with a virus   . Unspecified hypothyroidism     Tobacco History: History  Smoking Status  . Former Smoker  . Packs/day: 0.30  . Years: 2.00  . Types: Cigarettes  . Quit date: 02/12/1958  Smokeless Tobacco  . Never Used   Counseling given: Not Answered   Outpatient Encounter Prescriptions as of 08/02/2016  Medication Sig  . albuterol (PROVENTIL HFA) 108 (90 Base) MCG/ACT inhaler Inhale 2 puffs into the lungs every 6 (six) hours as needed for shortness of breath.  . Biotin (BIOTIN 5000) 5 MG CAPS Take 1 capsule by mouth daily.    Marland Kitchen buPROPion (WELLBUTRIN XL) 150 MG 24 hr tablet Take 150 mg by mouth daily.    . Calcium Carbonate-Vit D-Min 600-400 MG-UNIT TABS Take 1 tablet by mouth 2 (two) times daily.   . Cholecalciferol (VITAMIN D3) 2000 UNITS TABS Take 2,000 Units by mouth daily.   . clindamycin (CLEOCIN T) 1 % lotion Apply 1 application topically 2 (two) times daily as needed (bumps).  Marland Kitchen  fexofenadine (ALLEGRA) 180 MG tablet Take 180 mg by mouth at bedtime.  . fluticasone (FLONASE) 50 MCG/ACT nasal spray USE 2 SPRAYS IN EACH NOSTRIL EVERY DAY  . folic acid (FOLVITE) 1 MG tablet Take 1 mg by mouth 2 (two) times daily.    . Ginkgo Biloba 40 MG TABS Take 1 tablet by mouth 3 (three) times daily.   Marland Kitchen levothyroxine (SYNTHROID, LEVOTHROID) 75 MCG tablet Take 75 mcg by mouth daily. Except Sunday 88 mcg  . levothyroxine (SYNTHROID, LEVOTHROID) 88 MCG tablet Take only sunday   .  losartan-hydrochlorothiazide (HYZAAR) 100-25 MG tablet TAKE 1 TABLET BY MOUTH DAILY.  Marland Kitchen Lysine 500 MG TABS Take 1 tablet by mouth daily.  . meloxicam (MOBIC) 15 MG tablet TAKE 1 TABLET BY MOUTH EVERY MORNING  . metroNIDAZOLE (METROGEL) 1 % gel APPLY EVERY DAY  . minoxidil (ROGAINE) 2 % external solution Apply 1 application topically 2 (two) times daily.  . Multiple Vitamins-Minerals (CENTRUM SILVER ULTRA WOMENS PO) Take 1 capsule by mouth daily.  . pantoprazole (PROTONIX) 40 MG tablet TAKE 1 TABLET (40 MG TOTAL) BY MOUTH 2 (TWO) TIMES DAILY.  Marland Kitchen PARoxetine (PAXIL-CR) 12.5 MG 24 hr tablet Take 12.5 mg by mouth 2 (two) times daily.   Marland Kitchen PULMICORT FLEXHALER 180 MCG/ACT inhaler INHALE 2 PUFFS INTO THE LUNGS DAILY.  . Saline (SIMPLY SALINE) 0.9 % AERS Place 1 spray into the nose 2 (two) times daily as needed (dry).  . benzonatate (TESSALON) 200 MG capsule Take 1 capsule (200 mg total) by mouth 3 (three) times daily as needed for cough.  . predniSONE (DELTASONE) 10 MG tablet 4 tabs for 2 days, then 3 tabs for 2 days, 2 tabs for 2 days, then 1 tab for 2 days, then stop  . [DISCONTINUED] amoxicillin-clavulanate (AUGMENTIN) 875-125 MG tablet Take 1 tablet by mouth 2 (two) times daily.   No facility-administered encounter medications on file as of 08/02/2016.      Review of Systems  Constitutional:   No  weight loss, night sweats,  Fevers, chills, fatigue, or  lassitude.  HEENT:   No headaches,  Difficulty swallowing,  Tooth/dental problems, or  Sore throat,                No sneezing, itching, ear ache,  +nasal congestion, post nasal drip,   CV:  No chest pain,  Orthopnea, PND, swelling in lower extremities, anasarca, dizziness, palpitations, syncope.   GI  No heartburn, indigestion, abdominal pain, nausea, vomiting, diarrhea, change in bowel habits, loss of appetite, bloody stools.   Resp:   No chest wall deformity  Skin: no rash or lesions.  GU: no dysuria, change in color of urine, no  urgency or frequency.  No flank pain, no hematuria   MS:  No joint pain or swelling.  No decreased range of motion.  No back pain.    Physical Exam  BP 120/78 (BP Location: Left Arm, Patient Position: Sitting, Cuff Size: Normal)   Pulse 71   Ht 5\' 3"  (1.6 m)   Wt 180 lb (81.6 kg)   SpO2 94%   BMI 31.89 kg/m   GEN: A/Ox3; pleasant , NAD, elderly    HEENT:  [place]/AT,  EACs-clear, TMs-wnl, NOSE-clear, THROAT-clear, no lesions, no postnasal drip or exudate noted.   NECK:  Supple w/ fair ROM; no JVD; normal carotid impulses w/o bruits; no thyromegaly or nodules palpated; no lymphadenopathy.    RESP  Few trace wheezing  ,  no accessory muscle use, no  dullness to percussion Speaking in full sentences   CARD:  RRR, no m/r/g, no peripheral edema, pulses intact, no cyanosis or clubbing.  GI:   Soft & nt; nml bowel sounds; no organomegaly or masses detected.   Musco: Warm bil, no deformities or joint swelling noted.   Neuro: alert, no focal deficits noted.    Skin: Warm, no lesions or rashes    Lab Results:  CBC    Component Value Date/Time   WBC 6.0 11/10/2015 1520   RBC 4.40 11/10/2015 1520   HGB 12.3 11/10/2015 1520   HGB 13.0 04/17/2012 1445   HCT 37.5 11/10/2015 1520   HCT 38.8 04/17/2012 1445   PLT 335 11/10/2015 1520   PLT 313 04/17/2012 1445   MCV 85.2 11/10/2015 1520   MCV 88.2 04/17/2012 1445   MCH 28.0 11/10/2015 1520   MCHC 32.8 11/10/2015 1520   RDW 14.1 11/10/2015 1520   RDW 14.3 04/17/2012 1445   LYMPHSABS 1,320 11/10/2015 1520   LYMPHSABS 1.6 04/17/2012 1445   MONOABS 660 11/10/2015 1520   MONOABS 0.5 04/17/2012 1445   EOSABS 300 11/10/2015 1520   EOSABS 0.5 04/17/2012 1445   BASOSABS 60 11/10/2015 1520   BASOSABS 0.1 04/17/2012 1445    BMET    Component Value Date/Time   NA 141 11/10/2015 1520   NA 143 04/17/2012 1445   K 4.6 11/10/2015 1520   K 3.8 04/17/2012 1445   CL 102 11/10/2015 1520   CL 106 04/17/2012 1445   CO2 23 11/10/2015 1520     CO2 27 04/17/2012 1445   GLUCOSE 87 11/10/2015 1520   GLUCOSE 149 (H) 04/17/2012 1445   BUN 20 11/10/2015 1520   BUN 15.6 04/17/2012 1445   CREATININE 0.86 11/10/2015 1520   CREATININE 0.8 04/17/2012 1445   CALCIUM 9.5 11/10/2015 1520   CALCIUM 9.2 04/17/2012 1445   GFRNONAA 68 11/10/2015 1520   GFRAA 79 11/10/2015 1520    BNP    Component Value Date/Time   BNP 17.8 10/25/2010 1526    ProBNP    Component Value Date/Time   PROBNP 21.0 06/22/2013 1548    Imaging: No results found.   Assessment & Plan:   Asthma with allergic rhinitis Flare with AR/GERD with VCD   Plan  Patient Instructions  Prednisone taper over next week.  Mucinex Twice daily   Begin Deslym 2 tsp Twice daily  For cough , As needed   Begin Tessalon Three times a day  For cough , As needed   Continue on Allegra daily in am .  Begin Chlortrimeton 4mg  .At bedtime   Saline nasal spray As needed   Continue on Protonix 40mg  daily before meal  Begin Pepcid 20mg  At bedtime  .  Follow up with Dr. Elsworth Soho or Mikah Rottinghaus 4 weeks.  Please contact office for sooner follow up if symptoms do not improve or worsen or seek emergency care       [place] for triggers   Plan  Patient Instructions  Prednisone taper over next week.  Mucinex Twice daily   Begin Deslym 2 tsp Twice daily  For cough , As needed   Begin Tessalon Three times a day  For cough , As needed   Continue on Allegra daily in am .  Begin Chlortrimeton 4mg  .At bedtime   Saline nasal spray As needed   Continue on Protonix 40mg  daily before meal  Begin Pepcid 20mg  At bedtime  .  Follow up with Dr. Elsworth Soho  or Shakya Sebring 4 weeks.  Please contact office for sooner follow up if symptoms do not improve or worsen or seek emergency care       GERD GERD diet  Change to PPI and Pepcid      Rexene Edison, NP 08/02/2016

## 2016-08-02 NOTE — Assessment & Plan Note (Signed)
GERD diet  Change to PPI and Pepcid

## 2016-08-02 NOTE — Patient Instructions (Signed)
Prednisone taper over next week.  Mucinex Twice daily   Begin Deslym 2 tsp Twice daily  For cough , As needed   Begin Tessalon Three times a day  For cough , As needed   Continue on Allegra daily in am .  Begin Chlortrimeton 4mg  .At bedtime   Saline nasal spray As needed   Continue on Protonix 40mg  daily before meal  Begin Pepcid 20mg  At bedtime  .  Follow up with Dr. Elsworth Soho or Daune Divirgilio 4 weeks.  Please contact office for sooner follow up if symptoms do not improve or worsen or seek emergency care

## 2016-08-06 NOTE — Progress Notes (Signed)
Reviewed & agree with plan  

## 2016-08-14 DIAGNOSIS — F902 Attention-deficit hyperactivity disorder, combined type: Secondary | ICD-10-CM | POA: Diagnosis not present

## 2016-08-14 DIAGNOSIS — F410 Panic disorder [episodic paroxysmal anxiety] without agoraphobia: Secondary | ICD-10-CM | POA: Diagnosis not present

## 2016-08-14 DIAGNOSIS — F422 Mixed obsessional thoughts and acts: Secondary | ICD-10-CM | POA: Diagnosis not present

## 2016-08-23 ENCOUNTER — Encounter: Payer: Self-pay | Admitting: Adult Health

## 2016-08-23 ENCOUNTER — Ambulatory Visit (INDEPENDENT_AMBULATORY_CARE_PROVIDER_SITE_OTHER): Payer: Medicare Other | Admitting: Adult Health

## 2016-08-23 DIAGNOSIS — J301 Allergic rhinitis due to pollen: Secondary | ICD-10-CM

## 2016-08-23 DIAGNOSIS — J4520 Mild intermittent asthma, uncomplicated: Secondary | ICD-10-CM | POA: Diagnosis not present

## 2016-08-23 NOTE — Patient Instructions (Signed)
Mucinex Twice daily  As needed  Congestion .  Use Deslym 2 tsp Twice daily  For cough , As needed   Tessalon Three times a day  For cough , As needed   Continue on Allegra daily in am .  Use Chlorphenaramine 4mg  .At bedtime  As needed   Saline nasal spray As needed   Continue on Protonix 40mg  daily before meal  Continue on Pepcid 20mg  At bedtime  .  Follow up with Dr. Elsworth Soho 3 -4 months and As needed    Please contact office for sooner follow up if symptoms do not improve or worsen or seek emergency care

## 2016-08-23 NOTE — Assessment & Plan Note (Signed)
Recent flare now resolved   Plan  Patient Instructions  Mucinex Twice daily  As needed  Congestion .  Use Deslym 2 tsp Twice daily  For cough , As needed   Tessalon Three times a day  For cough , As needed   Continue on Allegra daily in am .  Use Chlorphenaramine 4mg  .At bedtime  As needed   Saline nasal spray As needed   Continue on Protonix 40mg  daily before meal  Continue on Pepcid 20mg  At bedtime  .  Follow up with Dr. Elsworth Soho 3 -4 months and As needed    Please contact office for sooner follow up if symptoms do not improve or worsen or seek emergency care

## 2016-08-23 NOTE — Progress Notes (Signed)
@Patient  ID: [name], female    DOB: 1945/10/10, 71 y.o.   MRN: [id number]  Chief Complaint  Patient presents with  . Follow-up    Asthma     Referring provider: Hali Marry, *  HPI: 71 yo female Followed for mild persistent asthma, allergic rhinitis and chronic cough with vocal cord dysfunction  08/23/2016 Follow up : Asthma /Cough /AR  Pt returns for 3 week follow up . Seen last ov with Asthma flare . She was treated with prednisone taper , cough control regimen with mucinex and delsym . Started on allegra/chlortrimeton. And GERD coverage with PPI /Pepcid. She is feeling so much better. Hoarseness has resolved.  Breathing is better w/ decreased cough .  Reflux is better . Remains on protonix and pepcid .  Denies chest pain , orthopnea or edema.       Allergies  Allergen Reactions  . Astelin [Azelastine Hcl] Other (See Comments)    Headache  . Lisinopril     ACE cough  . Morphine     REACTION: Nausea  . Moxifloxacin     REACTION: confusion, dizziness, paranoia    Immunization History  Administered Date(s) Administered  . Influenza Split 11/07/2011, 01/12/2013  . Influenza Whole 02/12/2005, 12/02/2007, 11/24/2008  . Influenza,inj,Quad PF,36+ Mos 01/11/2015  . Influenza-Unspecified 11/12/2013  . Pneumococcal Conjugate-13 04/26/2014  . Pneumococcal Polysaccharide-23 02/12/2001, 11/27/2001, 11/28/2006, 11/07/2011  . Td 02/12/1998, 06/13/1998  . Tdap 09/23/2012    Past Medical History:  Diagnosis Date  . Allergic rhinitis, cause unspecified   . Anxiety   . Benign paroxysmal positional vertigo   . Cancer ([place])    basal cell carcinoma per right side of nostril  . Depressive disorder, not elsewhere classified   . Diabetes mellitus without complication ([place])    noted per H&P with Dr Dianah Field 08/03/2014   . Esophageal reflux   . History of hiatal hernia   . Hypertension   . Localized osteoarthrosis not specified whether primary or secondary,  lower leg   . Other and unspecified hyperlipidemia   . Other diseases of vocal cords   . Peptic ulcer, unspecified site, unspecified as acute or chronic, without mention of hemorrhage, perforation, or obstruction   . Pneumonia    bilat pneumonia 1987  . Shortness of breath dyspnea    with anxiety; with climbing stairs  . TMJ (temporomandibular joint disorder)   . Unspecified arthropathy, hand   . Unspecified asthma(493.90)    triggered with a virus   . Unspecified hypothyroidism     Tobacco History: History  Smoking Status  . Former Smoker  . Packs/day: 0.30  . Years: 2.00  . Types: Cigarettes  . Quit date: 02/12/1958  Smokeless Tobacco  . Never Used   Counseling given: Not Answered   Outpatient Encounter Prescriptions as of 08/23/2016  Medication Sig  . albuterol (PROVENTIL HFA) 108 (90 Base) MCG/ACT inhaler Inhale 2 puffs into the lungs every 6 (six) hours as needed for shortness of breath.  . benzonatate (TESSALON) 200 MG capsule Take 1 capsule (200 mg total) by mouth 3 (three) times daily as needed for cough.  . Biotin (BIOTIN 5000) 5 MG CAPS Take 1 capsule by mouth daily.    Marland Kitchen buPROPion (WELLBUTRIN XL) 150 MG 24 hr tablet Take 150 mg by mouth daily.    . Calcium Carbonate-Vit D-Min 600-400 MG-UNIT TABS Take 1 tablet by mouth 2 (two) times daily.   . Cholecalciferol (VITAMIN D3) 2000 UNITS TABS Take 2,000 Units  by mouth daily.   . clindamycin (CLEOCIN T) 1 % lotion Apply 1 application topically 2 (two) times daily as needed (bumps).  . fexofenadine (ALLEGRA) 180 MG tablet Take 180 mg by mouth at bedtime.  . fluticasone (FLONASE) 50 MCG/ACT nasal spray USE 2 SPRAYS IN EACH NOSTRIL EVERY DAY  . folic acid (FOLVITE) 1 MG tablet Take 1 mg by mouth 2 (two) times daily.    Marland Kitchen levothyroxine (SYNTHROID, LEVOTHROID) 75 MCG tablet Take 75 mcg by mouth daily. Except Sunday 88 mcg  . levothyroxine (SYNTHROID, LEVOTHROID) 88 MCG tablet Take only sunday   . losartan-hydrochlorothiazide  (HYZAAR) 100-25 MG tablet TAKE 1 TABLET BY MOUTH DAILY.  Marland Kitchen Lysine 500 MG TABS Take 1 tablet by mouth daily.  . meloxicam (MOBIC) 15 MG tablet TAKE 1 TABLET BY MOUTH EVERY MORNING  . metroNIDAZOLE (METROGEL) 1 % gel APPLY EVERY DAY  . minoxidil (ROGAINE) 2 % external solution Apply 1 application topically 2 (two) times daily.  . Multiple Vitamins-Minerals (CENTRUM SILVER ULTRA WOMENS PO) Take 1 capsule by mouth daily.  . pantoprazole (PROTONIX) 40 MG tablet TAKE 1 TABLET (40 MG TOTAL) BY MOUTH 2 (TWO) TIMES DAILY.  Marland Kitchen PARoxetine (PAXIL-CR) 12.5 MG 24 hr tablet Take 12.5 mg by mouth 2 (two) times daily.   Marland Kitchen PULMICORT FLEXHALER 180 MCG/ACT inhaler INHALE 2 PUFFS INTO THE LUNGS DAILY.  . Saline (SIMPLY SALINE) 0.9 % AERS Place 1 spray into the nose 2 (two) times daily as needed (dry).  . [DISCONTINUED] Ginkgo Biloba 40 MG TABS Take 1 tablet by mouth 3 (three) times daily.   . [DISCONTINUED] predniSONE (DELTASONE) 10 MG tablet 4 tabs for 2 days, then 3 tabs for 2 days, 2 tabs for 2 days, then 1 tab for 2 days, then stop   No facility-administered encounter medications on file as of 08/23/2016.      Review of Systems  Constitutional:   No  weight loss, night sweats,  Fevers, chills, fatigue, or  lassitude.  HEENT:   No headaches,  Difficulty swallowing,  Tooth/dental problems, or  Sore throat,                No sneezing, itching, ear ache, nasal congestion, post nasal drip,   CV:  No chest pain,  Orthopnea, PND, swelling in lower extremities, anasarca, dizziness, palpitations, syncope.   GI  No heartburn, indigestion, abdominal pain, nausea, vomiting, diarrhea, change in bowel habits, loss of appetite, bloody stools.   Resp: No shortness of breath with exertion or at rest.  No excess mucus, no productive cough,  No non-productive cough,  No coughing up of blood.  No change in color of mucus.  No wheezing.  No chest wall deformity  Skin: no rash or lesions.  GU: no dysuria, change in color of  urine, no urgency or frequency.  No flank pain, no hematuria   MS:  No joint pain or swelling.  No decreased range of motion.  No back pain.    Physical Exam  BP 133/80 (BP Location: Left Arm, Patient Position: Sitting, Cuff Size: Normal)   Pulse 69   Ht 5\' 3"  (1.6 m)   Wt 180 lb (81.6 kg)   SpO2 98%   BMI 31.89 kg/m   GEN: A/Ox3; pleasant , NAD, well nourished    HEENT:  [place]/AT,  EACs-clear, TMs-wnl, NOSE-clear, THROAT-clear, no lesions, no postnasal drip or exudate noted.   NECK:  Supple w/ fair ROM; no JVD; normal carotid impulses w/o bruits; no  thyromegaly or nodules palpated; no lymphadenopathy.    RESP  Clear  P & A; w/o, wheezes/ rales/ or rhonchi. no accessory muscle use, no dullness to percussion  CARD:  RRR, no m/r/g, no peripheral edema, pulses intact, no cyanosis or clubbing.  GI:   Soft & nt; nml bowel sounds; no organomegaly or masses detected.   Musco: Warm bil, no deformities or joint swelling noted.   Neuro: alert, no focal deficits noted.    Skin: Warm, no lesions or rashes     Lab Results:  CBC    Component Value Date/Time   WBC 6.0 11/10/2015 1520   RBC 4.40 11/10/2015 1520   HGB 12.3 11/10/2015 1520   HGB 13.0 04/17/2012 1445   HCT 37.5 11/10/2015 1520   HCT 38.8 04/17/2012 1445   PLT 335 11/10/2015 1520   PLT 313 04/17/2012 1445   MCV 85.2 11/10/2015 1520   MCV 88.2 04/17/2012 1445   MCH 28.0 11/10/2015 1520   MCHC 32.8 11/10/2015 1520   RDW 14.1 11/10/2015 1520   RDW 14.3 04/17/2012 1445   LYMPHSABS 1,320 11/10/2015 1520   LYMPHSABS 1.6 04/17/2012 1445   MONOABS 660 11/10/2015 1520   MONOABS 0.5 04/17/2012 1445   EOSABS 300 11/10/2015 1520   EOSABS 0.5 04/17/2012 1445   BASOSABS 60 11/10/2015 1520   BASOSABS 0.1 04/17/2012 1445    BMET    Component Value Date/Time   NA 141 11/10/2015 1520   NA 143 04/17/2012 1445   K 4.6 11/10/2015 1520   K 3.8 04/17/2012 1445   CL 102 11/10/2015 1520   CL 106 04/17/2012 1445   CO2 23  11/10/2015 1520   CO2 27 04/17/2012 1445   GLUCOSE 87 11/10/2015 1520   GLUCOSE 149 (H) 04/17/2012 1445   BUN 20 11/10/2015 1520   BUN 15.6 04/17/2012 1445   CREATININE 0.86 11/10/2015 1520   CREATININE 0.8 04/17/2012 1445   CALCIUM 9.5 11/10/2015 1520   CALCIUM 9.2 04/17/2012 1445   GFRNONAA 68 11/10/2015 1520   GFRAA 79 11/10/2015 1520    BNP    Component Value Date/Time   BNP 17.8 10/25/2010 1526    ProBNP    Component Value Date/Time   PROBNP 21.0 06/22/2013 1548    Imaging: No results found.   Assessment & Plan:   Allergic rhinitis Recent flare now resolved   Plan  . Patient Instructions  Mucinex Twice daily  As needed  Congestion .  Use Deslym 2 tsp Twice daily  For cough , As needed   Tessalon Three times a day  For cough , As needed   Continue on Allegra daily in am .  Use Chlorphenaramine 4mg  .At bedtime  As needed   Saline nasal spray As needed   Continue on Protonix 40mg  daily before meal  Continue on Pepcid 20mg  At bedtime  .  Follow up with Dr. Elsworth Soho 3 -4 months and As needed    Please contact office for sooner follow up if symptoms do not improve or worsen or seek emergency care       Asthma with allergic rhinitis Recent flare now resolved   Plan  Patient Instructions  Mucinex Twice daily  As needed  Congestion .  Use Deslym 2 tsp Twice daily  For cough , As needed   Tessalon Three times a day  For cough , As needed   Continue on Allegra daily in am .  Use Chlorphenaramine 4mg  .At bedtime  As needed  Saline nasal spray As needed   Continue on Protonix 40mg  daily before meal  Continue on Pepcid 20mg  At bedtime  .  Follow up with Dr. Elsworth Soho 3 -4 months and As needed    Please contact office for sooner follow up if symptoms do not improve or worsen or seek emergency care          Rexene Edison, NP 08/23/2016

## 2016-08-23 NOTE — Assessment & Plan Note (Signed)
Recent flare now resolved   Plan  . Patient Instructions  Mucinex Twice daily  As needed  Congestion .  Use Deslym 2 tsp Twice daily  For cough , As needed   Tessalon Three times a day  For cough , As needed   Continue on Allegra daily in am .  Use Chlorphenaramine 4mg  .At bedtime  As needed   Saline nasal spray As needed   Continue on Protonix 40mg  daily before meal  Continue on Pepcid 20mg  At bedtime  .  Follow up with Dr. Elsworth Soho 3 -4 months and As needed    Please contact office for sooner follow up if symptoms do not improve or worsen or seek emergency care

## 2016-08-24 NOTE — Progress Notes (Signed)
Reviewed & agree with plan  

## 2016-09-03 ENCOUNTER — Other Ambulatory Visit: Payer: Self-pay

## 2016-09-13 ENCOUNTER — Encounter: Payer: Self-pay | Admitting: Osteopathic Medicine

## 2016-09-13 ENCOUNTER — Ambulatory Visit (INDEPENDENT_AMBULATORY_CARE_PROVIDER_SITE_OTHER): Payer: Medicare Other | Admitting: Osteopathic Medicine

## 2016-09-13 VITALS — BP 131/83 | HR 78 | Temp 98.2°F | Ht 63.0 in | Wt 184.0 lb

## 2016-09-13 DIAGNOSIS — J4541 Moderate persistent asthma with (acute) exacerbation: Secondary | ICD-10-CM | POA: Diagnosis not present

## 2016-09-13 MED ORDER — AZITHROMYCIN 250 MG PO TABS: ORAL_TABLET | ORAL | 0 refills | Status: DC

## 2016-09-13 MED ORDER — PREDNISONE 10 MG PO TABS
ORAL_TABLET | ORAL | 0 refills | Status: DC
Start: 2016-09-13 — End: 2016-10-17

## 2016-09-13 NOTE — Progress Notes (Signed)
HPI: [name] is a 71 y.o. female who presents to [facility] 09/13/16 for chief complaint of:  Chief Complaint  Patient presents with  . Cough    Acute Illness:  History of asthma, following with pulmonology. Feeling sick with cough lately, no other URI symptoms such as sinus or sore throat . productive cough   . Duration: 2 weeks . Modifying factors: has tried the following OTC/Rx medications: mucinex, tessalon     Past medical, social and family history reviewed.  Immune compromising conditions or other risk factors: asthma  Current medications and allergies reviewed.  Outpatient Encounter Prescriptions as of 09/13/2016  Medication Sig  . albuterol (PROVENTIL HFA) 108 (90 Base) MCG/ACT inhaler Inhale 2 puffs into the lungs every 6 (six) hours as needed for shortness of breath.  . benzonatate (TESSALON) 200 MG capsule Take 1 capsule (200 mg total) by mouth 3 (three) times daily as needed for cough.  . Biotin (BIOTIN 5000) 5 MG CAPS Take 1 capsule by mouth daily.    Marland Kitchen buPROPion (WELLBUTRIN XL) 150 MG 24 hr tablet Take 150 mg by mouth daily.    . Calcium Carbonate-Vit D-Min 600-400 MG-UNIT TABS Take 1 tablet by mouth 2 (two) times daily.   . Cholecalciferol (VITAMIN D3) 2000 UNITS TABS Take 2,000 Units by mouth daily.   . clindamycin (CLEOCIN T) 1 % lotion Apply 1 application topically 2 (two) times daily as needed (bumps).  . Famotidine (PEPCID PO) Take by mouth.  . fexofenadine (ALLEGRA) 180 MG tablet Take 180 mg by mouth at bedtime.  . fluticasone (FLONASE) 50 MCG/ACT nasal spray USE 2 SPRAYS IN EACH NOSTRIL EVERY DAY  . folic acid (FOLVITE) 1 MG tablet Take 1 mg by mouth 2 (two) times daily.    Marland Kitchen levothyroxine (SYNTHROID, LEVOTHROID) 75 MCG tablet Take 75 mcg by mouth daily. Except Sunday 88 mcg  . levothyroxine (SYNTHROID, LEVOTHROID) 88 MCG tablet Take only sunday   . losartan-hydrochlorothiazide (HYZAAR) 100-25 MG tablet TAKE 1 TABLET  BY MOUTH DAILY.  Marland Kitchen Lysine 500 MG TABS Take 1 tablet by mouth daily.  . meloxicam (MOBIC) 15 MG tablet TAKE 1 TABLET BY MOUTH EVERY MORNING  . metroNIDAZOLE (METROGEL) 1 % gel APPLY EVERY DAY  . minoxidil (ROGAINE) 2 % external solution Apply 1 application topically 2 (two) times daily.  . Multiple Vitamins-Minerals (CENTRUM SILVER ULTRA WOMENS PO) Take 1 capsule by mouth daily.  . pantoprazole (PROTONIX) 40 MG tablet TAKE 1 TABLET (40 MG TOTAL) BY MOUTH 2 (TWO) TIMES DAILY. (Patient taking differently: Take 40 mg by mouth daily. )  . PARoxetine (PAXIL-CR) 12.5 MG 24 hr tablet Take 12.5 mg by mouth 2 (two) times daily.   Marland Kitchen PULMICORT FLEXHALER 180 MCG/ACT inhaler INHALE 2 PUFFS INTO THE LUNGS DAILY.  . Saline (SIMPLY SALINE) 0.9 % AERS Place 1 spray into the nose 2 (two) times daily as needed (dry).  .    .     No facility-administered encounter medications on file as of 09/13/2016.        Review of Systems:  Constitutional: subjective fever/chills  HEENT: No  headache, No  sore throat, No  swollen glands  Cardiovascular: No chest pain  Respiratory:Yes  cough, No  shortness of breath  Gastrointestinal: No  nausea, No  vomiting,  No  diarrhea  Musculoskeletal:   No  myalgia/arthralgia  Skin/Integument:  No  rash   Detailed Exam:  BP 131/83   Pulse 78   Temp  98.2 F (36.8 C) (Oral)   Ht 5\' 3"  (1.6 m)   Wt 184 lb (83.5 kg)   BMI 32.59 kg/m   Constitutional:   VSS, see above.   General Appearance: alert, well-developed, well-nourished, NAD  Eyes:   Normal lids and conjunctive, non-icteric sclera  Ears, Nose, Mouth, Throat:   Normal external inspection ears/nares  Normal mouth/lips/gums, MMM  normal TM  posterior pharynx without erythema, without exudate  nasal mucosa normal  Skin:  Normal inspection, no rash or concerning lesions noted on limited exam  Neck:   No masses, trachea midline. normal lymph nodes  Respiratory:   Normal respiratory effort.    Yes  Wheeze mild diffuse bilaterally without rhonchi/rales  Cardiovascular:   S1/S2 normal, no murmur/rub/gallop auscultated. RRR.    ASSESSMENT/PLAN:  Moderate persistent asthma with allergic rhinitis with acute exacerbation. Steroid taper to start, continue current inhalers, fill antibiotics if no better after the next few days on steroid treatment    Visit summary was printed for the patient with medications and pertinent instructions for patient to review. ER/RTC precautions reviewed. All questions answered. Return if symptoms worsen or fail to improve.

## 2016-09-18 DIAGNOSIS — F422 Mixed obsessional thoughts and acts: Secondary | ICD-10-CM | POA: Diagnosis not present

## 2016-09-18 DIAGNOSIS — F902 Attention-deficit hyperactivity disorder, combined type: Secondary | ICD-10-CM | POA: Diagnosis not present

## 2016-09-18 DIAGNOSIS — F410 Panic disorder [episodic paroxysmal anxiety] without agoraphobia: Secondary | ICD-10-CM | POA: Diagnosis not present

## 2016-09-20 ENCOUNTER — Other Ambulatory Visit: Payer: Self-pay | Admitting: Family Medicine

## 2016-09-20 DIAGNOSIS — R921 Mammographic calcification found on diagnostic imaging of breast: Secondary | ICD-10-CM

## 2016-09-27 ENCOUNTER — Ambulatory Visit: Payer: Medicare Other | Admitting: Pulmonary Disease

## 2016-10-05 ENCOUNTER — Other Ambulatory Visit: Payer: Self-pay | Admitting: Family Medicine

## 2016-10-16 DIAGNOSIS — F422 Mixed obsessional thoughts and acts: Secondary | ICD-10-CM | POA: Diagnosis not present

## 2016-10-16 DIAGNOSIS — F902 Attention-deficit hyperactivity disorder, combined type: Secondary | ICD-10-CM | POA: Diagnosis not present

## 2016-10-16 DIAGNOSIS — F410 Panic disorder [episodic paroxysmal anxiety] without agoraphobia: Secondary | ICD-10-CM | POA: Diagnosis not present

## 2016-10-17 ENCOUNTER — Encounter: Payer: Self-pay | Admitting: Pulmonary Disease

## 2016-10-17 ENCOUNTER — Ambulatory Visit (INDEPENDENT_AMBULATORY_CARE_PROVIDER_SITE_OTHER): Payer: Medicare Other | Admitting: Pulmonary Disease

## 2016-10-17 ENCOUNTER — Other Ambulatory Visit (INDEPENDENT_AMBULATORY_CARE_PROVIDER_SITE_OTHER): Payer: Medicare Other

## 2016-10-17 VITALS — BP 120/72 | HR 88 | Ht 63.0 in | Wt 186.4 lb

## 2016-10-17 DIAGNOSIS — I519 Heart disease, unspecified: Principal | ICD-10-CM

## 2016-10-17 DIAGNOSIS — J301 Allergic rhinitis due to pollen: Secondary | ICD-10-CM

## 2016-10-17 DIAGNOSIS — J4531 Mild persistent asthma with (acute) exacerbation: Secondary | ICD-10-CM

## 2016-10-17 DIAGNOSIS — E039 Hypothyroidism, unspecified: Secondary | ICD-10-CM | POA: Diagnosis not present

## 2016-10-17 LAB — CBC WITH DIFFERENTIAL/PLATELET
Basophils Absolute: 0.1 10*3/uL (ref 0.0–0.1)
Basophils Relative: 1.2 % (ref 0.0–3.0)
EOS PCT: 6.8 % — AB (ref 0.0–5.0)
Eosinophils Absolute: 0.6 10*3/uL (ref 0.0–0.7)
HEMATOCRIT: 39.3 % (ref 36.0–46.0)
HEMOGLOBIN: 12.9 g/dL (ref 12.0–15.0)
Lymphocytes Relative: 22 % (ref 12.0–46.0)
Lymphs Abs: 1.9 10*3/uL (ref 0.7–4.0)
MCHC: 32.8 g/dL (ref 30.0–36.0)
MCV: 86.6 fl (ref 78.0–100.0)
MONO ABS: 0.8 10*3/uL (ref 0.1–1.0)
Monocytes Relative: 8.9 % (ref 3.0–12.0)
Neutro Abs: 5.3 10*3/uL (ref 1.4–7.7)
Neutrophils Relative %: 61.1 % (ref 43.0–77.0)
Platelets: 365 10*3/uL (ref 150.0–400.0)
RBC: 4.55 Mil/uL (ref 3.87–5.11)
RDW: 14 % (ref 11.5–15.5)
WBC: 8.6 10*3/uL (ref 4.0–10.5)

## 2016-10-17 MED ORDER — PREDNISONE 10 MG PO TABS: ORAL_TABLET | ORAL | 0 refills | Status: DC

## 2016-10-17 MED ORDER — BUDESONIDE-FORMOTEROL FUMARATE 80-4.5 MCG/ACT IN AERO: 2.0000 | INHALATION_SPRAY | Freq: Two times a day (BID) | RESPIRATORY_TRACT | 0 refills | Status: DC

## 2016-10-17 NOTE — Progress Notes (Signed)
   Subjective:    Patient ID: [name], female    DOB: 1945/04/04, 71 y.o.   MRN: [id number]  HPI  71 yo female Followed for mild persistent asthma, allergic rhinitis and chronic cough with vocal cord dysfunction She was followed by Dr. Joya Gaskins for 20 years and then relocated to the [facility] office. She had a rough winter with influenza and had breakthrough persistent symptoms requiring frequent prednisone tapers. Strategy in the past was to increase Pulmicort which she is maintained on 2 twice daily. She was given Symbicort by her PCP but this caused palpitations although it was effective in controlling his symptoms  I first saw her in 06/2016 and on a follow-up visit with TP in 08/2016 she fell dramatically improved. She now feels that she is worse again with increased intermittent wheezing and productive cough, productive of white to yellow sputum. She reports occasional nocturnal symptoms takes albuterol MDI almost daily.  She relocated from [place] to [place] about 20 years ago and wonders if she needs to move to [place], [place]  She does report intermittent reflux symptoms but denies postnasal drip. She does report perennial Rhinitis symptoms  Significant tests/ events reviewed  Spirometry 06/2013 ratio 63 FEV1 normal. Chest x-ray 03/2016 changes of chronic bronchitis   Past Medical History:  Diagnosis Date  . Allergic rhinitis, cause unspecified   . Anxiety   . Benign paroxysmal positional vertigo   . Cancer ([place])    basal cell carcinoma per right side of nostril  . Depressive disorder, not elsewhere classified   . Diabetes mellitus without complication ([place])    noted per H&P with Dr Dianah Field 08/03/2014   . Esophageal reflux   . History of hiatal hernia   . Hypertension   . Localized osteoarthrosis not specified whether primary or secondary, lower leg   . Other and unspecified hyperlipidemia   . Other diseases of vocal cords   . Peptic ulcer, unspecified  site, unspecified as acute or chronic, without mention of hemorrhage, perforation, or obstruction   . Pneumonia    bilat pneumonia 1987  . Shortness of breath dyspnea    with anxiety; with climbing stairs  . TMJ (temporomandibular joint disorder)   . Unspecified arthropathy, hand   . Unspecified asthma(493.90)    triggered with a virus   . Unspecified hypothyroidism      Review of Systems neg for any significant sore throat, dysphagia, itching, sneezing, nasal congestion or excess/ purulent secretions, fever, chills, sweats, unintended wt loss, pleuritic or exertional cp, hempoptysis, orthopnea pnd or change in chronic leg swelling. Also denies presyncope, palpitations, heartburn, abdominal pain, nausea, vomiting, diarrhea or change in bowel or urinary habits, dysuria,hematuria, rash, arthralgias, visual complaints, headache, numbness weakness or ataxia.      Objective:   Physical Exam  Gen. Pleasant, well-nourished, in no distress ENT - no thrush, no post nasal drip Neck: No JVD, no thyromegaly, no carotid bruits Lungs: no use of accessory muscles, no dullness to percussion, clear without rales or rhonchi  Cardiovascular: Rhythm regular, heart sounds  normal, no murmurs or gallops, no peripheral edema Musculoskeletal: No deformities, no cyanosis or clubbing        Assessment & Plan:

## 2016-10-17 NOTE — Assessment & Plan Note (Signed)
Prednisone 10 mg tabs  Take 2 tabs daily with food x 5ds, then 1 tab daily with food x 5ds then STOP  Due to persistent symptoms, would step up therapy to steroid/LABA combination Stop taking Pulmicort. Instead take Symbicort 80/4.5 -2 puffs twice daily, if you have increased palpitations with this call us and we can decrease the dose to one puff twice daily  Use albuterol as needed only

## 2016-10-17 NOTE — Assessment & Plan Note (Signed)
Blood work today for allergies- RAST ,CBC

## 2016-10-17 NOTE — Patient Instructions (Signed)
Prednisone 10 mg tabs  Take 2 tabs daily with food x 5ds, then 1 tab daily with food x 5ds then STOP   Stop taking Pulmicort. Instead take Symbicort 80/4.5 -2 puffs twice daily, if you have increased palpitations with this call us and we can decrease the dose to one puff twice daily  Use albuterol as needed only  Blood work today for allergies- RAST ,CBC

## 2016-10-18 LAB — RESPIRATORY ALLERGY PROFILE REGION II ~~LOC~~
Allergen, A. alternata, m6: <0.1 kU/L
Allergen, Cedar tree, t12: <0.1 kU/L
Allergen, Comm Silver Birch, t9: <0.1 kU/L
Allergen, Cottonwood, t14: <0.1 kU/L
Bermuda Grass: <0.1 kU/L
CLADOSPORIUM HERBARUM (M2) IGE: <0.1 kU/L
CLASS: 0
CLASS: 0
CLASS: 0
CLASS: 0
CLASS: 0
CLASS: 0
CLASS: 0
CLASS: 0
CLASS: 0
CLASS: 0
CLASS: 0
COMMON RAGWEED (SHORT) (W1) IGE: <0.1 kU/L
Cat Dander: 2.75 kU/L — ABNORMAL HIGH
Class: 0
Class: 0
Class: 0
Class: 0
Class: 0
Class: 0
Class: 0
Class: 0
Class: 0
Class: 0
Class: 0
Class: 0
Class: 2
Dog Dander: <0.1 kU/L
IgE (Immunoglobulin E), Serum: 155 kU/L — ABNORMAL HIGH (ref <=114)
Pecan/Hickory Tree IgE: <0.1 kU/L
Sheep Sorrel IgE: <0.1 kU/L

## 2016-10-18 LAB — INTERPRETATION:

## 2016-11-01 ENCOUNTER — Other Ambulatory Visit: Payer: Medicare Other

## 2016-11-07 ENCOUNTER — Encounter: Payer: Self-pay | Admitting: Family Medicine

## 2016-11-07 ENCOUNTER — Ambulatory Visit (INDEPENDENT_AMBULATORY_CARE_PROVIDER_SITE_OTHER): Payer: Medicare Other | Admitting: Family Medicine

## 2016-11-07 VITALS — BP 133/71 | HR 86 | Ht 63.0 in | Wt 187.0 lb

## 2016-11-07 DIAGNOSIS — F439 Reaction to severe stress, unspecified: Secondary | ICD-10-CM | POA: Diagnosis not present

## 2016-11-07 DIAGNOSIS — R079 Chest pain, unspecified: Secondary | ICD-10-CM | POA: Diagnosis not present

## 2016-11-07 DIAGNOSIS — R600 Localized edema: Secondary | ICD-10-CM | POA: Diagnosis not present

## 2016-11-07 LAB — CBC WITH DIFFERENTIAL/PLATELET
BASOS ABS: 69 {cells}/uL (ref 0–200)
Basophils Relative: 1.1 %
EOS PCT: 7.6 %
Eosinophils Absolute: 479 cells/uL (ref 15–500)
HCT: 37.8 % (ref 35.0–45.0)
Hemoglobin: 12.5 g/dL (ref 11.7–15.5)
Lymphs Abs: 1229 cells/uL (ref 850–3900)
MCH: 28 pg (ref 27.0–33.0)
MCHC: 33.1 g/dL (ref 32.0–36.0)
MCV: 84.6 fL (ref 80.0–100.0)
MONOS PCT: 10.4 %
MPV: 11 fL (ref 7.5–12.5)
NEUTROS ABS: 3868 {cells}/uL (ref 1500–7800)
NEUTROS PCT: 61.4 %
Platelets: 310 10*3/uL (ref 140–400)
RBC: 4.47 10*6/uL (ref 3.80–5.10)
RDW: 13.3 % (ref 11.0–15.0)
Total Lymphocyte: 19.5 %
WBC mixed population: 655 cells/uL (ref 200–950)
WBC: 6.3 10*3/uL (ref 3.8–10.8)

## 2016-11-07 LAB — COMPLETE METABOLIC PANEL WITH GFR
AG Ratio: 1.7 (calc) (ref 1.0–2.5)
ALKALINE PHOSPHATASE (APISO): 57 U/L (ref 33–130)
ALT: 35 U/L — AB (ref 6–29)
AST: 19 U/L (ref 10–35)
Albumin: 3.8 g/dL (ref 3.6–5.1)
BUN: 22 mg/dL (ref 7–25)
CALCIUM: 9.1 mg/dL (ref 8.6–10.4)
CO2: 29 mmol/L (ref 20–32)
CREATININE: 0.82 mg/dL (ref 0.60–0.93)
Chloride: 102 mmol/L (ref 98–110)
GFR, Est African American: 83 mL/min/{1.73_m2} (ref >=60)
GFR, Est Non African American: 71 mL/min/{1.73_m2} (ref >=60)
GLUCOSE: 99 mg/dL (ref 65–99)
Globulin: 2.2 g/dL (calc) (ref 1.9–3.7)
Potassium: 4.5 mmol/L (ref 3.5–5.3)
Sodium: 138 mmol/L (ref 135–146)
Total Bilirubin: 0.5 mg/dL (ref 0.2–1.2)
Total Protein: 6 g/dL — ABNORMAL LOW (ref 6.1–8.1)

## 2016-11-07 LAB — D-DIMER, QUANTITATIVE (NOT AT ARMC): D DIMER QUANT: 0.75 ug{FEU}/mL — AB (ref <0.50)

## 2016-11-07 LAB — TSH: TSH: 0.69 m[IU]/L (ref 0.40–4.50)

## 2016-11-07 LAB — BRAIN NATRIURETIC PEPTIDE: BRAIN NATRIURETIC PEPTIDE: 15 pg/mL (ref <100)

## 2016-11-07 NOTE — Progress Notes (Signed)
Subjective:    Patient ID: [name], female    DOB: Apr 11, 1945, 71 y.o.   MRN: [id number]  HPI 71 year old female comes in today complaining of swelling in both of her feet. She says it started about a week ago. Then on Monday started noticing that she had a sore scratchy throat. The sore throat only lasted about a day but now over the last couple of days she is just felt lightheaded fatigued and a little achy. No significant cough or nasal congestion. No she's been on her grandson he's had a viral URI. She just feels shaky on the inside the last couple days as well. She denies any recent changes in medications or changes in salt intake. She was on prednisone 3 weeks ago for cough and was treated by her pulmonologist. She does take Symbicort daily and does 2 puffs but only once a day instead of twice a day. She has been experiencing some chest pain as well. That has actually been going on for greater than a month. She would notice it particularly with going upstairs. She says it's gotten worse since the lower extremity edema.  Says her feet was twice as big last night.   She's also been under some emotional stress recently because her son has said that he will not bring the grandchildren to her house because she has 3 cats and they have significant allergies to cats. This is emotionally upset her as to of her other children have recommended euthanizing animals.  Review of Systems     BP 133/71   Pulse 86   Ht 5\' 3"  (1.6 m)   Wt 187 lb (84.8 kg)   SpO2 97%   BMI 33.13 kg/m     Allergies  Allergen Reactions  . Astelin [Azelastine Hcl] Other (See Comments)    Headache  . Lisinopril     ACE cough  . Morphine     REACTION: Nausea  . Moxifloxacin     REACTION: confusion, dizziness, paranoia    Past Medical History:  Diagnosis Date  . Allergic rhinitis, cause unspecified   . Anxiety   . Benign paroxysmal positional vertigo   . Cancer ([place])    basal cell carcinoma per right  side of nostril  . Depressive disorder, not elsewhere classified   . Diabetes mellitus without complication ([place])    noted per H&P with Dr Dianah Field 08/03/2014   . Esophageal reflux   . History of hiatal hernia   . Hypertension   . Localized osteoarthrosis not specified whether primary or secondary, lower leg   . Other and unspecified hyperlipidemia   . Other diseases of vocal cords   . Peptic ulcer, unspecified site, unspecified as acute or chronic, without mention of hemorrhage, perforation, or obstruction   . Pneumonia    bilat pneumonia 1987  . Shortness of breath dyspnea    with anxiety; with climbing stairs  . TMJ (temporomandibular joint disorder)   . Unspecified arthropathy, hand   . Unspecified asthma(493.90)    triggered with a virus   . Unspecified hypothyroidism     Past Surgical History:  Procedure Laterality Date  . BREAST BIOPSY  09/07/2011   High Risk Lesion  . BREAST SURGERY     LUMPECTOMY / LEFT 10/12/2011  . KNEE ARTHROSCOPY     bilat   . NASAL SINUS SURGERY    . ROTATOR CUFF REPAIR     right   . thumb surgery      left  hand 45 years ago   . TOTAL HIP ARTHROPLASTY Right 08/10/2014   Procedure: RIGHT TOTAL HIP ARTHROPLASTY ANTERIOR APPROACH;  Surgeon: Paralee Cancel, MD;  Location: [facility];  Service: Orthopedics;  Laterality: Right;    Social History   Social History  . Marital status: Married    Spouse name: N/A  . Number of children: 3  . Years of education: N/A   Occupational History  . Retired    Social History Main Topics  . Smoking status: Former Smoker    Packs/day: 0.30    Years: 2.00    Types: Cigarettes    Quit date: 02/12/1958  . Smokeless tobacco: Never Used  . Alcohol use Yes     Comment: wine socially  . Drug use: No  . Sexual activity: Yes   Other Topics Concern  . Not on file   Social History Narrative  . No narrative on file    Family History  Problem Relation Age of Onset  . Heart disease Father   . Colon cancer  Mother   . Liver cancer Brother   . Asthma Brother     Outpatient Encounter Prescriptions as of 11/07/2016  Medication Sig  . albuterol (PROVENTIL HFA) 108 (90 Base) MCG/ACT inhaler Inhale 2 puffs into the lungs every 6 (six) hours as needed for shortness of breath.  . benzonatate (TESSALON) 200 MG capsule Take 1 capsule (200 mg total) by mouth 3 (three) times daily as needed for cough.  . Biotin (BIOTIN 5000) 5 MG CAPS Take 1 capsule by mouth daily.    . budesonide-formoterol (SYMBICORT) 80-4.5 MCG/ACT inhaler Inhale 2 puffs into the lungs 2 (two) times daily.  Marland Kitchen buPROPion (WELLBUTRIN XL) 150 MG 24 hr tablet Take 150 mg by mouth daily.    . Calcium Carbonate-Vit D-Min 600-400 MG-UNIT TABS Take 1 tablet by mouth 2 (two) times daily.   . Cholecalciferol (VITAMIN D3) 2000 UNITS TABS Take 2,000 Units by mouth daily.   . clindamycin (CLEOCIN T) 1 % lotion Apply 1 application topically 2 (two) times daily as needed (bumps).  . Famotidine (PEPCID PO) Take by mouth.  . fexofenadine (ALLEGRA) 180 MG tablet Take 180 mg by mouth at bedtime.  . fluticasone (FLONASE) 50 MCG/ACT nasal spray USE 2 SPRAYS IN EACH NOSTRIL EVERY DAY  . folic acid (FOLVITE) 1 MG tablet Take 1 mg by mouth 2 (two) times daily.    Marland Kitchen levothyroxine (SYNTHROID, LEVOTHROID) 75 MCG tablet Take 75 mcg by mouth daily. Except Sunday 88 mcg  . levothyroxine (SYNTHROID, LEVOTHROID) 88 MCG tablet Take only sunday   . losartan-hydrochlorothiazide (HYZAAR) 100-25 MG tablet TAKE 1 TABLET BY MOUTH DAILY.  Marland Kitchen Lysine 500 MG TABS Take 1 tablet by mouth daily.  . meloxicam (MOBIC) 15 MG tablet TAKE 1 TABLET BY MOUTH EVERY MORNING  . metroNIDAZOLE (METROGEL) 1 % gel APPLY EVERY DAY  . minoxidil (ROGAINE) 2 % external solution Apply 1 application topically 2 (two) times daily.  . Multiple Vitamins-Minerals (CENTRUM SILVER ULTRA WOMENS PO) Take 1 capsule by mouth daily.  . pantoprazole (PROTONIX) 40 MG tablet TAKE 1 TABLET (40 MG TOTAL) BY MOUTH 2  (TWO) TIMES DAILY. (Patient taking differently: Take 40 mg by mouth daily. )  . PARoxetine (PAXIL-CR) 12.5 MG 24 hr tablet Take 12.5 mg by mouth 2 (two) times daily.   Marland Kitchen PULMICORT FLEXHALER 180 MCG/ACT inhaler INHALE 2 PUFFS INTO THE LUNGS DAILY.  . Saline (SIMPLY SALINE) 0.9 % AERS Place 1 spray into the  nose 2 (two) times daily as needed (dry).  . [DISCONTINUED] predniSONE (DELTASONE) 10 MG tablet Take 2 tabs daily x5 days, then 1 tab x 5 days, then STOP   No facility-administered encounter medications on file as of 11/07/2016.       Objective:   Physical Exam  Constitutional: She is oriented to person, place, and time. She appears well-developed and well-nourished.  HENT:  Head: Normocephalic and atraumatic.  Right Ear: External ear normal.  Left Ear: External ear normal.  Nose: Nose normal.  Mouth/Throat: Oropharynx is clear and moist.  TMs and canals are clear.   Eyes: Pupils are equal, round, and reactive to light. Conjunctivae and EOM are normal.  Neck: Neck supple. No thyromegaly present.  Cardiovascular: Normal rate, regular rhythm and normal heart sounds.   Pulmonary/Chest: Effort normal and breath sounds normal. She has no wheezes.  Musculoskeletal:  1 + pitting edema in both feet and ankle.  Trace edema to mid tibia bilaterally.   Lymphadenopathy:    She has no cervical adenopathy.  Neurological: She is alert and oriented to person, place, and time.  Skin: Skin is warm and dry.  Psychiatric: She has a normal mood and affect.           Assessment & Plan:  Bilateral lower extremity edema- Unclear etiology. She has had some chest pain with it which can be a little bit concerning for the possibility of congestive heart failure. Will check a BNP. We'll also check for thyroid disorder, anemia and infection. Will call with results once available. Having any urinary symptoms currently.  Chest pain-EKG today shows rate of 70 bpm, normal sinus rhythm with no acute ST-T wave  changes. It does look like she has an incomplete right bundle branch block, unchanged from previous of August 20 17 done over at the cardiology office.  Increased stress-the certainly could be contributing to some of her symptoms that typically would not cause swelling but certainly could be causing some of her chest pain.

## 2016-11-08 NOTE — Addendum Note (Signed)
Addended by: Beatrice Lecher D on: 11/08/2016 05:17 PM   Modules accepted: Orders

## 2016-11-09 ENCOUNTER — Encounter: Payer: Self-pay | Admitting: Family Medicine

## 2016-11-09 ENCOUNTER — Other Ambulatory Visit: Payer: Self-pay

## 2016-11-09 ENCOUNTER — Encounter (HOSPITAL_BASED_OUTPATIENT_CLINIC_OR_DEPARTMENT_OTHER): Payer: Self-pay

## 2016-11-09 ENCOUNTER — Ambulatory Visit (HOSPITAL_BASED_OUTPATIENT_CLINIC_OR_DEPARTMENT_OTHER)
Admission: RE | Admit: 2016-11-09 | Discharge: 2016-11-09 | Disposition: A | Discharge status: Home / Self Care | Payer: Medicare Other | Source: Ambulatory Visit | Attending: Family Medicine | Admitting: Family Medicine

## 2016-11-09 DIAGNOSIS — I700 Atherosclerosis of aorta: Secondary | ICD-10-CM | POA: Insufficient documentation

## 2016-11-09 DIAGNOSIS — R079 Chest pain, unspecified: Secondary | ICD-10-CM

## 2016-11-09 DIAGNOSIS — R7989 Other specified abnormal findings of blood chemistry: Secondary | ICD-10-CM | POA: Diagnosis not present

## 2016-11-09 DIAGNOSIS — K760 Fatty (change of) liver, not elsewhere classified: Secondary | ICD-10-CM | POA: Insufficient documentation

## 2016-11-09 DIAGNOSIS — I509 Heart failure, unspecified: Secondary | ICD-10-CM

## 2016-11-09 DIAGNOSIS — R600 Localized edema: Secondary | ICD-10-CM | POA: Insufficient documentation

## 2016-11-09 HISTORY — DX: Heart failure, unspecified: I50.9

## 2016-11-09 MED ORDER — IOPAMIDOL (ISOVUE-370) INJECTION 76%
100.0000 mL | Freq: Once | INTRAVENOUS | Status: AC | PRN
  Administered 2016-11-09: 100 mL via INTRAVENOUS

## 2016-11-09 MED ORDER — BUDESONIDE-FORMOTEROL FUMARATE 80-4.5 MCG/ACT IN AERO: 2.0000 | INHALATION_SPRAY | Freq: Two times a day (BID) | RESPIRATORY_TRACT | 2 refills | Status: DC

## 2016-11-15 DIAGNOSIS — E039 Hypothyroidism, unspecified: Secondary | ICD-10-CM | POA: Diagnosis not present

## 2016-11-15 DIAGNOSIS — E7800 Pure hypercholesterolemia, unspecified: Secondary | ICD-10-CM | POA: Diagnosis not present

## 2016-11-15 DIAGNOSIS — R7301 Impaired fasting glucose: Secondary | ICD-10-CM | POA: Diagnosis not present

## 2016-11-15 DIAGNOSIS — R5382 Chronic fatigue, unspecified: Secondary | ICD-10-CM | POA: Diagnosis not present

## 2016-11-19 ENCOUNTER — Ambulatory Visit (INDEPENDENT_AMBULATORY_CARE_PROVIDER_SITE_OTHER): Payer: Medicare Other | Admitting: Family Medicine

## 2016-11-19 ENCOUNTER — Encounter: Payer: Self-pay | Admitting: Family Medicine

## 2016-11-19 VITALS — BP 135/64 | HR 81 | Temp 98.3°F | Ht 63.0 in | Wt 186.0 lb

## 2016-11-19 DIAGNOSIS — J309 Allergic rhinitis, unspecified: Secondary | ICD-10-CM | POA: Diagnosis not present

## 2016-11-19 DIAGNOSIS — L989 Disorder of the skin and subcutaneous tissue, unspecified: Secondary | ICD-10-CM

## 2016-11-19 DIAGNOSIS — K760 Fatty (change of) liver, not elsewhere classified: Secondary | ICD-10-CM | POA: Diagnosis not present

## 2016-11-19 DIAGNOSIS — I10 Essential (primary) hypertension: Secondary | ICD-10-CM | POA: Diagnosis not present

## 2016-11-19 DIAGNOSIS — Z23 Encounter for immunization: Secondary | ICD-10-CM | POA: Diagnosis not present

## 2016-11-19 MED ORDER — HYDROCHLOROTHIAZIDE 25 MG PO TABS: 25.0000 mg | ORAL_TABLET | Freq: Every day | ORAL | 3 refills | Status: DC

## 2016-11-19 MED ORDER — LOSARTAN POTASSIUM 50 MG PO TABS: 50.0000 mg | ORAL_TABLET | Freq: Every day | ORAL | 3 refills | Status: DC

## 2016-11-19 NOTE — Progress Notes (Signed)
Subjective:    Patient ID: [name], female    DOB: 06-Oct-1945, 71 y.o.   MRN: [id number]  HPI Bilateral lower extremities swelling-we did rule out a blood clot and heart failure. The swelling is a little better than it was a week ago but still mildly present. She is taking half of her blood pressure pill.  She was outside holding a sign for about an hour and a half yesterday and now today she got some sore throat eye irritation. She had a lot of sneezing yesterday and just feels a little bit tight around her throat. She's had some chills but no fever.  On recent CT she was noted to have a fatty liver and one to discuss that today.It was noted that she had borderline to mild cardiomegaly and wanted to discuss that today. She went online and looked at the symptoms for this which were things like lightheadedness dizziness and headaches. She feels that she has all of these symptoms.   Review of Systems  BP 135/64   Pulse 81   Temp 98.3 F (36.8 C)   Ht 5\' 3"  (1.6 m)   Wt 186 lb (84.4 kg)   SpO2 97%   BMI 32.95 kg/m     Allergies  Allergen Reactions  . Astelin [Azelastine Hcl] Other (See Comments)    Headache  . Lisinopril     ACE cough  . Morphine     REACTION: Nausea  . Moxifloxacin     REACTION: confusion, dizziness, paranoia    Past Medical History:  Diagnosis Date  . Allergic rhinitis, cause unspecified   . Anxiety   . Benign paroxysmal positional vertigo   . Cancer ([place])    basal cell carcinoma per right side of nostril  . Depressive disorder, not elsewhere classified   . Diabetes mellitus without complication ([place])    noted per H&P with Dr Dianah Field 08/03/2014   . Esophageal reflux   . History of hiatal hernia   . Hypertension   . Localized osteoarthrosis not specified whether primary or secondary, lower leg   . Other and unspecified hyperlipidemia   . Other diseases of vocal cords   . Peptic ulcer, unspecified site, unspecified as acute or chronic,  without mention of hemorrhage, perforation, or obstruction   . Pneumonia    bilat pneumonia 1987  . Shortness of breath dyspnea    with anxiety; with climbing stairs  . TMJ (temporomandibular joint disorder)   . Unspecified arthropathy, hand   . Unspecified asthma(493.90)    triggered with a virus   . Unspecified hypothyroidism     Past Surgical History:  Procedure Laterality Date  . BREAST BIOPSY  09/07/2011   High Risk Lesion  . BREAST SURGERY     LUMPECTOMY / LEFT 10/12/2011  . KNEE ARTHROSCOPY     bilat   . NASAL SINUS SURGERY    . ROTATOR CUFF REPAIR     right   . thumb surgery      left hand 45 years ago   . TOTAL HIP ARTHROPLASTY Right 08/10/2014   Procedure: RIGHT TOTAL HIP ARTHROPLASTY ANTERIOR APPROACH;  Surgeon: Paralee Cancel, MD;  Location: [facility];  Service: Orthopedics;  Laterality: Right;    Social History   Social History  . Marital status: Married    Spouse name: N/A  . Number of children: 3  . Years of education: N/A   Occupational History  . Retired    Social History Main Topics  .  Smoking status: Former Smoker    Packs/day: 0.30    Years: 2.00    Types: Cigarettes    Quit date: 02/12/1958  . Smokeless tobacco: Never Used  . Alcohol use Yes     Comment: wine socially  . Drug use: No  . Sexual activity: Yes   Other Topics Concern  . Not on file   Social History Narrative  . No narrative on file    Family History  Problem Relation Age of Onset  . Heart disease Father   . Colon cancer Mother   . Liver cancer Brother   . Asthma Brother     Outpatient Encounter Prescriptions as of 11/19/2016  Medication Sig  . albuterol (PROVENTIL HFA) 108 (90 Base) MCG/ACT inhaler Inhale 2 puffs into the lungs every 6 (six) hours as needed for shortness of breath.  Marland Kitchen buPROPion (WELLBUTRIN XL) 150 MG 24 hr tablet Take 150 mg by mouth daily.    . Calcium Carbonate-Vit D-Min 600-400 MG-UNIT TABS Take 1 tablet by mouth 2 (two) times daily.   . clindamycin  (CLEOCIN T) 1 % lotion Apply 1 application topically 2 (two) times daily as needed (bumps).  . Famotidine (PEPCID PO) Take by mouth.  . fexofenadine (ALLEGRA) 180 MG tablet Take 180 mg by mouth at bedtime.  . folic acid (FOLVITE) 1 MG tablet Take 1 mg by mouth 2 (two) times daily.    . hydrochlorothiazide (HYDRODIURIL) 25 MG tablet Take 1 tablet (25 mg total) by mouth daily.  . lansoprazole (PREVACID) 30 MG capsule Take 30 mg by mouth at bedtime.  Marland Kitchen levothyroxine (SYNTHROID, LEVOTHROID) 75 MCG tablet Take 75 mcg by mouth daily. Except Sunday 88 mcg  . levothyroxine (SYNTHROID, LEVOTHROID) 88 MCG tablet Take only sunday   . losartan (COZAAR) 50 MG tablet Take 1 tablet (50 mg total) by mouth daily.  . meloxicam (MOBIC) 15 MG tablet TAKE 1 TABLET BY MOUTH EVERY MORNING  . metroNIDAZOLE (METROGEL) 1 % gel APPLY EVERY DAY  . minoxidil (ROGAINE) 2 % external solution Apply 1 application topically 2 (two) times daily.  . Multiple Vitamins-Minerals (CENTRUM SILVER ULTRA WOMENS PO) Take 1 capsule by mouth daily.  . pantoprazole (PROTONIX) 40 MG tablet TAKE 1 TABLET (40 MG TOTAL) BY MOUTH 2 (TWO) TIMES DAILY. (Patient taking differently: Take 40 mg by mouth daily. )  . pantoprazole (PROTONIX) 40 MG tablet Take 40 mg by mouth daily before breakfast.  . PARoxetine (PAXIL-CR) 12.5 MG 24 hr tablet Take 12.5 mg by mouth 2 (two) times daily.   Marland Kitchen PULMICORT FLEXHALER 180 MCG/ACT inhaler INHALE 2 PUFFS INTO THE LUNGS DAILY.  . Saline (SIMPLY SALINE) 0.9 % AERS Place 1 spray into the nose 2 (two) times daily as needed (dry).  . [DISCONTINUED] benzonatate (TESSALON) 200 MG capsule Take 1 capsule (200 mg total) by mouth 3 (three) times daily as needed for cough.  . [DISCONTINUED] Biotin (BIOTIN 5000) 5 MG CAPS Take 1 capsule by mouth daily.    . [DISCONTINUED] budesonide-formoterol (SYMBICORT) 80-4.5 MCG/ACT inhaler Inhale 2 puffs into the lungs 2 (two) times daily.  . [DISCONTINUED] Cholecalciferol (VITAMIN D3) 2000  UNITS TABS Take 2,000 Units by mouth daily.   . [DISCONTINUED] fluticasone (FLONASE) 50 MCG/ACT nasal spray USE 2 SPRAYS IN EACH NOSTRIL EVERY DAY  . [DISCONTINUED] losartan-hydrochlorothiazide (HYZAAR) 100-25 MG tablet TAKE 1 TABLET BY MOUTH DAILY.  . [DISCONTINUED] Lysine 500 MG TABS Take 1 tablet by mouth daily.   No facility-administered encounter medications on file  as of 11/19/2016.          Objective:   Physical Exam  Constitutional: She is oriented to person, place, and time. She appears well-developed and well-nourished.  HENT:  Head: Normocephalic and atraumatic.  Right Ear: External ear normal.  Left Ear: External ear normal.  Nose: Nose normal.  Mouth/Throat: Oropharynx is clear and moist.  TMs and canals are clear.   Eyes: Pupils are equal, round, and reactive to light. Conjunctivae and EOM are normal.  Neck: Neck supple. No thyromegaly present.  Cardiovascular: Normal rate, regular rhythm and normal heart sounds.   Pulmonary/Chest: Effort normal and breath sounds normal. She has no wheezes.  Musculoskeletal: She exhibits edema.  Trace pitting edema in both feet bilaterally.  Lymphadenopathy:    She has no cervical adenopathy.  Neurological: She is alert and oriented to person, place, and time.  Skin: Skin is warm and dry.  Psychiatric: She has a normal mood and affect.          Assessment & Plan:  Fatty liver-the importance of regular exercise healthy diet and weight loss.  Allergic rhinitis -I do think that some of her symptoms are related to being outside over the weekend and being exposed to current allergens. Okay to continue with antihistamine or use a nasal spray for symptom relief.  Bilat lower extremity edema-overall her swelling has been better controlled but she is still swelling some. Thus I like to adjust her blood pressure medication so that we go up to a whole tab on hydrochlorothiazide which is what she was taking previously but have a slightly  reduced dose on her losartan.  Skin lesion - vision on her left upper anterior chest is suspicious for a basal cell. Recommend that she get in with her dermatologist, Bartholome Bill at with [facility].  Borderline cardiomegaly.  I gave her reassurance that this is not causing her lightheadedness and dizziness and that the most important thing that we do is to keep her healthy, maintain a healthy weight, control her blood pressure and keep her thyroid regulated.  Hypertension-well-controlled today. Thrombin adjust her medication just slightly so that we can increase the diuretic component of her medication. I will have to split them apart to do this.

## 2016-11-20 ENCOUNTER — Ambulatory Visit: Payer: Medicare Other | Admitting: Adult Health

## 2016-11-20 DIAGNOSIS — R5382 Chronic fatigue, unspecified: Secondary | ICD-10-CM | POA: Diagnosis not present

## 2016-11-20 DIAGNOSIS — R7301 Impaired fasting glucose: Secondary | ICD-10-CM | POA: Diagnosis not present

## 2016-11-20 DIAGNOSIS — E559 Vitamin D deficiency, unspecified: Secondary | ICD-10-CM | POA: Diagnosis not present

## 2016-11-20 DIAGNOSIS — Z833 Family history of diabetes mellitus: Secondary | ICD-10-CM | POA: Diagnosis not present

## 2016-11-20 DIAGNOSIS — L659 Nonscarring hair loss, unspecified: Secondary | ICD-10-CM | POA: Diagnosis not present

## 2016-11-20 DIAGNOSIS — E7800 Pure hypercholesterolemia, unspecified: Secondary | ICD-10-CM | POA: Diagnosis not present

## 2016-11-20 DIAGNOSIS — E039 Hypothyroidism, unspecified: Secondary | ICD-10-CM | POA: Diagnosis not present

## 2016-11-27 ENCOUNTER — Ambulatory Visit: Payer: Medicare Other | Admitting: Pulmonary Disease

## 2016-11-28 DIAGNOSIS — F902 Attention-deficit hyperactivity disorder, combined type: Secondary | ICD-10-CM | POA: Diagnosis not present

## 2016-11-28 DIAGNOSIS — F422 Mixed obsessional thoughts and acts: Secondary | ICD-10-CM | POA: Diagnosis not present

## 2016-11-28 DIAGNOSIS — F410 Panic disorder [episodic paroxysmal anxiety] without agoraphobia: Secondary | ICD-10-CM | POA: Diagnosis not present

## 2016-11-29 ENCOUNTER — Other Ambulatory Visit: Payer: Self-pay | Admitting: *Deleted

## 2016-11-29 ENCOUNTER — Other Ambulatory Visit: Payer: Medicare Other

## 2016-11-29 MED ORDER — PANTOPRAZOLE SODIUM 40 MG PO TBEC: 40.0000 mg | DELAYED_RELEASE_TABLET | Freq: Two times a day (BID) | ORAL | 3 refills | Status: DC

## 2016-11-30 ENCOUNTER — Other Ambulatory Visit: Payer: Self-pay | Admitting: Family Medicine

## 2016-12-02 ENCOUNTER — Other Ambulatory Visit: Payer: Self-pay | Admitting: Emergency Medicine

## 2016-12-04 ENCOUNTER — Encounter: Payer: Self-pay | Admitting: Adult Health

## 2016-12-04 ENCOUNTER — Ambulatory Visit (INDEPENDENT_AMBULATORY_CARE_PROVIDER_SITE_OTHER): Payer: Medicare Other | Admitting: Adult Health

## 2016-12-04 DIAGNOSIS — J4530 Mild persistent asthma, uncomplicated: Secondary | ICD-10-CM | POA: Diagnosis not present

## 2016-12-04 DIAGNOSIS — J309 Allergic rhinitis, unspecified: Secondary | ICD-10-CM

## 2016-12-04 NOTE — Assessment & Plan Note (Signed)
Improved control on Symbicort   Plan  Patient Instructions  Continue on Symbicort . Follow up in 3 months with Dr. Elsworth Soho  And As needed

## 2016-12-04 NOTE — Progress Notes (Signed)
@Patient  ID: [name], female    DOB: [birth year], 71 y.o.   MRN: [id number]  Chief Complaint  Patient presents with  . Follow-up    Asthma     Referring provider: Hali Marry, *  HPI: 71 yo female Followed for mild persistent asthma, allergic rhinitis and chronic cough with vocal cord dysfunction  12/04/2016 Follow up : Asthma /Cough /AR  Pt presents for 6 week follow up . She was changed from pulmicort to Symbicort last ov , feels that it is really helping. Feels breathing is doing good with no flare of cough or wheezing  No increased SABA use.   Remains on flonase and allegra for allergy symptoms .   Flu vaccine Is utd.   Her house is for sale, wants to move to [place] relies was tightened daily.     Allergies  Allergen Reactions  . Astelin [Azelastine Hcl] Other (See Comments)    Headache  . Lisinopril     ACE cough  . Morphine     REACTION: Nausea  . Moxifloxacin     REACTION: confusion, dizziness, paranoia    Immunization History  Administered Date(s) Administered  . Influenza Split 11/07/2011, 01/12/2013  . Influenza Whole 02/12/2005, 12/02/2007, 11/24/2008  . Influenza, High Dose Seasonal PF 11/19/2016  . Influenza,inj,Quad PF,6+ Mos 01/11/2015  . Influenza-Unspecified 11/12/2013  . Pneumococcal Conjugate-13 04/26/2014  . Pneumococcal Polysaccharide-23 02/12/2001, 11/27/2001, 11/28/2006, 11/07/2011  . Td 02/12/1998, 06/13/1998  . Tdap 09/23/2012    Past Medical History:  Diagnosis Date  . Allergic rhinitis, cause unspecified   . Anxiety   . Benign paroxysmal positional vertigo   . Cancer ([place])    basal cell carcinoma per right side of nostril  . Depressive disorder, not elsewhere classified   . Diabetes mellitus without complication ([place])    noted per H&P with Dr Dianah Field 08/03/2014   . Esophageal reflux   . History of hiatal hernia   . Hypertension   . Localized osteoarthrosis not specified whether primary or secondary,  lower leg   . Other and unspecified hyperlipidemia   . Other diseases of vocal cords   . Peptic ulcer, unspecified site, unspecified as acute or chronic, without mention of hemorrhage, perforation, or obstruction   . Pneumonia    bilat pneumonia 1987  . Shortness of breath dyspnea    with anxiety; with climbing stairs  . TMJ (temporomandibular joint disorder)   . Unspecified arthropathy, hand   . Unspecified asthma(493.90)    triggered with a virus   . Unspecified hypothyroidism     Tobacco History: History  Smoking Status  . Former Smoker  . Packs/day: 0.30  . Years: 2.00  . Types: Cigarettes  . Quit date: 02/12/1958  Smokeless Tobacco  . Never Used   Counseling given: Not Answered   Outpatient Encounter Prescriptions as of 12/04/2016  Medication Sig  . albuterol (PROVENTIL HFA) 108 (90 Base) MCG/ACT inhaler Inhale 2 puffs into the lungs every 6 (six) hours as needed for shortness of breath.  . budesonide-formoterol (SYMBICORT) 80-4.5 MCG/ACT inhaler Inhale 2 puffs into the lungs 2 (two) times daily.  Marland Kitchen buPROPion (WELLBUTRIN XL) 150 MG 24 hr tablet Take 150 mg by mouth daily.    . Calcium Carbonate-Vit D-Min 600-400 MG-UNIT TABS Take 1 tablet by mouth 2 (two) times daily.   . clindamycin (CLEOCIN T) 1 % lotion Apply 1 application topically 2 (two) times daily as needed (bumps).  . Famotidine (PEPCID PO) Take by mouth.  Marland Kitchen  fexofenadine (ALLEGRA) 180 MG tablet Take 180 mg by mouth at bedtime.  . fluticasone (FLONASE) 50 MCG/ACT nasal spray USE 2 SPRAYS IN EACH NOSTRIL EVERY DAY  . folic acid (FOLVITE) 1 MG tablet Take 1 mg by mouth 2 (two) times daily.    . hydrochlorothiazide (HYDRODIURIL) 25 MG tablet Take 1 tablet (25 mg total) by mouth daily.  Marland Kitchen levothyroxine (SYNTHROID, LEVOTHROID) 75 MCG tablet Take 75 mcg by mouth daily. Except Sunday 88 mcg  . levothyroxine (SYNTHROID, LEVOTHROID) 88 MCG tablet Take only sunday   . losartan (COZAAR) 50 MG tablet Take 1 tablet (50 mg  total) by mouth daily.  . meloxicam (MOBIC) 15 MG tablet TAKE 1 TABLET BY MOUTH EVERY MORNING  . metroNIDAZOLE (METROGEL) 1 % gel APPLY EVERY DAY  . minoxidil (ROGAINE) 2 % external solution Apply 1 application topically 2 (two) times daily.  . Multiple Vitamins-Minerals (CENTRUM SILVER ULTRA WOMENS PO) Take 1 capsule by mouth daily.  . pantoprazole (PROTONIX) 40 MG tablet Take 1 tablet (40 mg total) by mouth 2 (two) times daily.  Marland Kitchen PARoxetine (PAXIL-CR) 12.5 MG 24 hr tablet Take 12.5 mg by mouth 2 (two) times daily.   . Saline (SIMPLY SALINE) 0.9 % AERS Place 1 spray into the nose 2 (two) times daily as needed (dry).  . [DISCONTINUED] lansoprazole (PREVACID) 30 MG capsule Take 30 mg by mouth at bedtime.  . [DISCONTINUED] PULMICORT FLEXHALER 180 MCG/ACT inhaler INHALE 2 PUFFS INTO THE LUNGS DAILY.   No facility-administered encounter medications on file as of 12/04/2016.      Review of Systems  Constitutional:   No  weight loss, night sweats,  Fevers, chills, fatigue, or  lassitude.  HEENT:   No headaches,  Difficulty swallowing,  Tooth/dental problems, or  Sore throat,                No sneezing, itching, ear ache, nasal congestion, post nasal drip,   CV:  No chest pain,  Orthopnea, PND, swelling in lower extremities, anasarca, dizziness, palpitations, syncope.   GI  No heartburn, indigestion, abdominal pain, nausea, vomiting, diarrhea, change in bowel habits, loss of appetite, bloody stools.   Resp: No shortness of breath with exertion or at rest.  No excess mucus, no productive cough,  No non-productive cough,  No coughing up of blood.  No change in color of mucus.  No wheezing.  No chest wall deformity  Skin: no rash or lesions.  GU: no dysuria, change in color of urine, no urgency or frequency.  No flank pain, no hematuria   MS:  No joint pain or swelling.  No decreased range of motion.  No back pain.    Physical Exam  BP 124/74 (BP Location: Left Arm, Cuff Size: Normal)    Pulse 80   Ht 5\' 3"  (1.6 m)   Wt 185 lb 8 oz (84.1 kg)   SpO2 96%   BMI 32.86 kg/m   GEN: A/Ox3; pleasant , NAD, elderly    HEENT:  [place]/AT,  EACs-clear, TMs-wnl, NOSE-clear, THROAT-clear, no lesions, no postnasal drip or exudate noted.   NECK:  Supple w/ fair ROM; no JVD; normal carotid impulses w/o bruits; no thyromegaly or nodules palpated; no lymphadenopathy.    RESP  Clear  P & A; w/o, wheezes/ rales/ or rhonchi. no accessory muscle use, no dullness to percussion  CARD:  RRR, no m/r/g, no peripheral edema, pulses intact, no cyanosis or clubbing.  GI:   Soft & nt; nml bowel sounds;  no organomegaly or masses detected.   Musco: Warm bil, no deformities or joint swelling noted.   Neuro: alert, no focal deficits noted.    Skin: Warm, no lesions or rashes    Lab Results:  GU as there was 1 EDC Imaging: Ct Angio Chest W/cm &/or Wo Cm  Result Date: 11/09/2016 CLINICAL DATA:  71 year old female with bilateral lower extremity edema and abnormal D-dimer. EXAM: CT ANGIOGRAPHY CHEST WITH CONTRAST TECHNIQUE: Multidetector CT imaging of the chest was performed using the standard protocol during bolus administration of intravenous contrast. Multiplanar CT image reconstructions and MIPs were obtained to evaluate the vascular anatomy. CONTRAST:  100 mL Isovue 370 COMPARISON:  Chest radiographs 03/23/2016 and earlier. FINDINGS: Cardiovascular: Good contrast bolus timing in the pulmonary arterial tree. No focal filling defect identified in the pulmonary arteries to suggest acute pulmonary embolism. Borderline to mild cardiomegaly. No pericardial effusion. Mild Calcified aortic atherosclerosis. No definite calcified coronary artery atherosclerosis. Mediastinum/Nodes: Negative.  No mediastinal lymphadenopathy. Lungs/Pleura: Trace retained secretions along the left lateral wall of the trachea. Major airways are patent. Mild dependent and right lung azygoesophageal recess atelectasis. Mild left  costophrenic angle atelectasis. No pleural effusion. Upper Abdomen: Hepatic steatosis. Negative visualized spleen, pancreas, adrenal glands, kidneys, and bowel in the upper abdomen. Musculoskeletal: Osteopenia with occasional benign thoracic vertebral body hemangiomas. Upper lumbar advanced disc, and endplate degeneration. No acute osseous abnormality identified. Review of the MIP images confirms the above findings. IMPRESSION: 1.  Negative for acute pulmonary embolus. 2. No acute findings in the chest. 3.  Aortic Atherosclerosis (ICD10-I70.0). 4. Fatty liver disease. Electronically Signed   By: Genevie Ann M.D.   On: 11/09/2016 13:49     Assessment & Plan:   No problem-specific Assessment & Plan notes found for this encounter.     Rexene Edison, NP 12/04/2016

## 2016-12-04 NOTE — Assessment & Plan Note (Signed)
Controlled on rx   

## 2016-12-04 NOTE — Patient Instructions (Addendum)
Continue on Symbicort . Follow up in 3 months with Dr. Elsworth Soho  And As needed

## 2016-12-18 ENCOUNTER — Ambulatory Visit
Admission: RE | Admit: 2016-12-18 | Discharge: 2016-12-18 | Disposition: A | Discharge status: Home / Self Care | Payer: Medicare Other | Source: Ambulatory Visit | Attending: Family Medicine | Admitting: Family Medicine

## 2016-12-18 DIAGNOSIS — R921 Mammographic calcification found on diagnostic imaging of breast: Secondary | ICD-10-CM

## 2016-12-18 DIAGNOSIS — R928 Other abnormal and inconclusive findings on diagnostic imaging of breast: Secondary | ICD-10-CM | POA: Diagnosis not present

## 2016-12-18 DIAGNOSIS — N6489 Other specified disorders of breast: Secondary | ICD-10-CM | POA: Diagnosis not present

## 2016-12-25 DIAGNOSIS — F410 Panic disorder [episodic paroxysmal anxiety] without agoraphobia: Secondary | ICD-10-CM | POA: Diagnosis not present

## 2016-12-25 DIAGNOSIS — F422 Mixed obsessional thoughts and acts: Secondary | ICD-10-CM | POA: Diagnosis not present

## 2016-12-25 DIAGNOSIS — F902 Attention-deficit hyperactivity disorder, combined type: Secondary | ICD-10-CM | POA: Diagnosis not present

## 2016-12-27 ENCOUNTER — Ambulatory Visit: Payer: Medicare Other | Admitting: Adult Health

## 2017-01-15 DIAGNOSIS — F422 Mixed obsessional thoughts and acts: Secondary | ICD-10-CM | POA: Diagnosis not present

## 2017-01-15 DIAGNOSIS — F902 Attention-deficit hyperactivity disorder, combined type: Secondary | ICD-10-CM | POA: Diagnosis not present

## 2017-01-15 DIAGNOSIS — F410 Panic disorder [episodic paroxysmal anxiety] without agoraphobia: Secondary | ICD-10-CM | POA: Diagnosis not present

## 2017-03-04 ENCOUNTER — Ambulatory Visit: Payer: Medicare Other | Admitting: Adult Health

## 2017-03-12 ENCOUNTER — Encounter: Payer: Self-pay | Admitting: Podiatry

## 2017-03-12 ENCOUNTER — Ambulatory Visit (INDEPENDENT_AMBULATORY_CARE_PROVIDER_SITE_OTHER): Payer: Medicare Other | Admitting: Podiatry

## 2017-03-12 VITALS — BP 143/76 | HR 81 | Wt 185.0 lb

## 2017-03-12 DIAGNOSIS — M79671 Pain in right foot: Secondary | ICD-10-CM

## 2017-03-12 DIAGNOSIS — M79672 Pain in left foot: Secondary | ICD-10-CM

## 2017-03-12 DIAGNOSIS — M2042 Other hammer toe(s) (acquired), left foot: Secondary | ICD-10-CM

## 2017-03-12 DIAGNOSIS — M2041 Other hammer toe(s) (acquired), right foot: Secondary | ICD-10-CM | POA: Diagnosis not present

## 2017-03-12 DIAGNOSIS — B351 Tinea unguium: Secondary | ICD-10-CM

## 2017-03-12 NOTE — Patient Instructions (Signed)
Seen for hypertrophic fungal nails. All nails debrided. May use over the counter antifungal medication for mild fungal condition on nails. Return in 3 months or sooner if needed.

## 2017-03-12 NOTE — Progress Notes (Signed)
SUBJECTIVE: 72 y.o. year old female presents complaining of osteoarthritis on toes and pain to walk. Having difficulty cutting nails and some nails have fungus inside. Using oils for the nail.  Uses Inhaler for Asthma since she [id number] viral infection. Moved from [place] 20 years ago.  Review of Systems  Constitutional: Negative.   HENT: Negative.   Respiratory: Negative for cough, shortness of breath and wheezing.        Has Asthma and being monitored by pulmonologist.  Cardiovascular: Negative.   Gastrointestinal: Negative.   Genitourinary: Negative.   Skin: Negative.      OBJECTIVE: DERMATOLOGIC EXAMINATION: Hypertrophic mycotic nails x 10.  VASCULAR EXAMINATION OF LOWER LIMBS: All pedal pulses are palpable with normal pulsation.  Capillary Filling times within 3 seconds in all digits.  No edema or erythema noted. Temperature gradient from tibial crest to dorsum of foot is within normal bilateral.  NEUROLOGIC EXAMINATION OF THE LOWER LIMBS: Achilles DTR is present and within normal. Monofilament (Semmes-Weinstein 10-gm) sensory testing positive 6 out of 6, bilateral. Vibratory sensations(128Hz  turning fork) intact at medial and lateral forefoot bilateral.  Sharp and Dull discriminatory sensations at the plantar ball of hallux is intact bilateral.   MUSCULOSKELETAL EXAMINATION: Widening of toe joints with some pain at times, 2 and 4 right, 3rd left. Hypertrophic Achilles tendon without pain left.  ASSESSMENT: Contracted toes 2nd and 4th right, 3rd left. Mycotic nails x 10. Pain in both feet.  PLAN: Reviewed clinical findings and available treatment options. All nails debrided. Advised to use OTC antifungal drops for fungal nails. May return in 3 months.

## 2017-03-14 ENCOUNTER — Encounter: Payer: Self-pay | Admitting: Adult Health

## 2017-03-14 ENCOUNTER — Other Ambulatory Visit: Payer: Self-pay

## 2017-03-14 ENCOUNTER — Ambulatory Visit (INDEPENDENT_AMBULATORY_CARE_PROVIDER_SITE_OTHER): Payer: Medicare Other | Admitting: Adult Health

## 2017-03-14 DIAGNOSIS — R5382 Chronic fatigue, unspecified: Secondary | ICD-10-CM | POA: Diagnosis not present

## 2017-03-14 DIAGNOSIS — E039 Hypothyroidism, unspecified: Secondary | ICD-10-CM | POA: Diagnosis not present

## 2017-03-14 DIAGNOSIS — R7301 Impaired fasting glucose: Secondary | ICD-10-CM | POA: Diagnosis not present

## 2017-03-14 DIAGNOSIS — J4531 Mild persistent asthma with (acute) exacerbation: Secondary | ICD-10-CM | POA: Diagnosis not present

## 2017-03-14 DIAGNOSIS — J309 Allergic rhinitis, unspecified: Secondary | ICD-10-CM

## 2017-03-14 DIAGNOSIS — E7800 Pure hypercholesterolemia, unspecified: Secondary | ICD-10-CM | POA: Diagnosis not present

## 2017-03-14 MED ORDER — LEVALBUTEROL HCL 0.63 MG/3ML IN NEBU
0.6300 mg | INHALATION_SOLUTION | Freq: Once | RESPIRATORY_TRACT | Status: AC
  Administered 2017-03-14: 0.63 mg via RESPIRATORY_TRACT

## 2017-03-14 MED ORDER — PREDNISONE 10 MG PO TABS: ORAL_TABLET | ORAL | 0 refills | Status: DC

## 2017-03-14 NOTE — Progress Notes (Signed)
@Patient  ID: [name], female    DOB: 07-May-1945, 72 y.o.   MRN: [id number]  Chief Complaint  Patient presents with  . Follow-up    Asthma     Referring provider: Hali Marry, *  HPI: 72 yo female Followed for mild persistent asthma, allergic rhinitis and chronic cough with vocal cord dysfunction  Significant tests/ events reviewed  Spirometry 06/2013 ratio 63 FEV1 normal.  03/14/2017 Follow up : Asthma/AR  Pt returns for a 3 month follow up . Says doing well until last week when she starting having some nasal drainage , dry cough and wheezing . She just moved to [place]. Lots of boxes and dust. Feels that she is tight .  Remains on Symbicort Twice daily  . Takes Allegra everyday . Not using Flonase  Currently .  No fever, discolored mucus, chest pain  Or orthopnea.      Allergies  Allergen Reactions  . Astelin [Azelastine Hcl] Other (See Comments)    Headache  . Lisinopril     ACE cough  . Morphine     REACTION: Nausea  . Moxifloxacin     REACTION: confusion, dizziness, paranoia    Immunization History  Administered Date(s) Administered  . Influenza Split 11/07/2011, 01/12/2013  . Influenza Whole 02/12/2005, 12/02/2007, 11/24/2008  . Influenza, High Dose Seasonal PF 11/19/2016  . Influenza,inj,Quad PF,6+ Mos 01/11/2015  . Influenza-Unspecified 11/12/2013  . Pneumococcal Conjugate-13 04/26/2014  . Pneumococcal Polysaccharide-23 02/12/2001, 11/27/2001, 11/28/2006, 11/07/2011  . Td 02/12/1998, 06/13/1998  . Tdap 09/23/2012    Past Medical History:  Diagnosis Date  . Allergic rhinitis, cause unspecified   . Anxiety   . Benign paroxysmal positional vertigo   . Cancer ([place])    basal cell carcinoma per right side of nostril  . Depressive disorder, not elsewhere classified   . Diabetes mellitus without complication ([place])    noted per H&P with Dr Dianah Field 08/03/2014   . Esophageal reflux   . History of hiatal hernia   . Hypertension     . Localized osteoarthrosis not specified whether primary or secondary, lower leg   . Other and unspecified hyperlipidemia   . Other diseases of vocal cords   . Peptic ulcer, unspecified site, unspecified as acute or chronic, without mention of hemorrhage, perforation, or obstruction   . Pneumonia    bilat pneumonia 1987  . Shortness of breath dyspnea    with anxiety; with climbing stairs  . TMJ (temporomandibular joint disorder)   . Unspecified arthropathy, hand   . Unspecified asthma(493.90)    triggered with a virus   . Unspecified hypothyroidism     Tobacco History: Social History   Tobacco Use  Smoking Status Former Smoker  . Packs/day: 0.30  . Years: 2.00  . Pack years: 0.60  . Types: Cigarettes  . Last attempt to quit: 02/12/1958  . Years since quitting: 59.1  Smokeless Tobacco Never Used   Counseling given: Not Answered   Outpatient Encounter Medications as of 03/14/2017  Medication Sig  . albuterol (PROVENTIL HFA) 108 (90 Base) MCG/ACT inhaler Inhale 2 puffs into the lungs every 6 (six) hours as needed for shortness of breath.  . budesonide-formoterol (SYMBICORT) 80-4.5 MCG/ACT inhaler Inhale 2 puffs into the lungs 2 (two) times daily.  Marland Kitchen buPROPion (WELLBUTRIN XL) 150 MG 24 hr tablet Take 150 mg by mouth daily.    . Calcium Carbonate-Vit D-Min 600-400 MG-UNIT TABS Take 1 tablet by mouth 2 (two) times daily.   . clindamycin (CLEOCIN  T) 1 % lotion Apply 1 application topically 2 (two) times daily as needed (bumps).  . Famotidine (PEPCID PO) Take by mouth.  . fexofenadine (ALLEGRA) 180 MG tablet Take 180 mg by mouth at bedtime.  . fluticasone (FLONASE) 50 MCG/ACT nasal spray USE 2 SPRAYS IN EACH NOSTRIL EVERY DAY  . folic acid (FOLVITE) 1 MG tablet Take 1 mg by mouth 2 (two) times daily.    . hydrochlorothiazide (HYDRODIURIL) 25 MG tablet Take 1 tablet (25 mg total) by mouth daily.  Marland Kitchen levothyroxine (SYNTHROID, LEVOTHROID) 75 MCG tablet Take 75 mcg by mouth daily. Except  Sunday 88 mcg  . levothyroxine (SYNTHROID, LEVOTHROID) 88 MCG tablet Take only sunday   . losartan (COZAAR) 50 MG tablet Take 1 tablet (50 mg total) by mouth daily.  . meloxicam (MOBIC) 15 MG tablet TAKE 1 TABLET BY MOUTH EVERY MORNING  . metroNIDAZOLE (METROGEL) 1 % gel APPLY EVERY DAY  . minoxidil (ROGAINE) 2 % external solution Apply 1 application topically 2 (two) times daily.  . Multiple Vitamins-Minerals (CENTRUM SILVER ULTRA WOMENS PO) Take 1 capsule by mouth daily.  . pantoprazole (PROTONIX) 40 MG tablet Take 1 tablet (40 mg total) by mouth 2 (two) times daily.  Marland Kitchen PARoxetine (PAXIL-CR) 12.5 MG 24 hr tablet Take 12.5 mg by mouth 2 (two) times daily.   . Saline (SIMPLY SALINE) 0.9 % AERS Place 1 spray into the nose 2 (two) times daily as needed (dry).  . predniSONE (DELTASONE) 10 MG tablet 4 tabs for 2 days, then 3 tabs for 2 days, 2 tabs for 2 days, then 1 tab for 2 days, then stop   No facility-administered encounter medications on file as of 03/14/2017.      Review of Systems  Constitutional:   No  weight loss, night sweats,  Fevers, chills, fatigue, or  lassitude.  HEENT:   No headaches,  Difficulty swallowing,  Tooth/dental problems, or  Sore throat,                No sneezing, itching, ear ache,  +nasal congestion, post nasal drip,   CV:  No chest pain,  Orthopnea, PND, swelling in lower extremities, anasarca, dizziness, palpitations, syncope.   GI  No heartburn, indigestion, abdominal pain, nausea, vomiting, diarrhea, change in bowel habits, loss of appetite, bloody stools.   Resp:    No chest wall deformity  Skin: no rash or lesions.  GU: no dysuria, change in color of urine, no urgency or frequency.  No flank pain, no hematuria   MS:  No joint pain or swelling.  No decreased range of motion.  No back pain.    Physical Exam  BP 117/75 (BP Location: Left Arm, Cuff Size: Normal)   Pulse 98   Ht 5\' 2"  (1.575 m)   Wt 184 lb (83.5 kg)   SpO2 98%   BMI 33.65 kg/m     GEN: A/Ox3; pleasant , NAD, elderly    HEENT:  [place]/AT,  EACs-clear, TMs-wnl, NOSE-clear, THROAT-clear, no lesions, no postnasal drip or exudate noted.   NECK:  Supple w/ fair ROM; no JVD; normal carotid impulses w/o bruits; no thyromegaly or nodules palpated; no lymphadenopathy.    RESP  Trace rhonchi no accessory muscle use, no dullness to percussion  CARD:  RRR, no m/r/g, no peripheral edema, pulses intact, no cyanosis or clubbing.  GI:   Soft & nt; nml bowel sounds; no organomegaly or masses detected.   Musco: Warm bil, no deformities or joint swelling noted.  Neuro: alert, no focal deficits noted.    Skin: Warm, no lesions or rashes    Lab Results:   BMET  BNP  Imaging: No results found.   Assessment & Plan:   Asthma with allergic rhinitis Flare  xopenex neb x 1  Steroid pack to have on hold   Plan  Patient Instructions  Increase Symbicort 2 puffs Twice daily  For 2 weeks then back  2 puffs daily.  Restart Flonase 2 puffs daily .  Saline nasal rinses As needed   Mucinex DM Twice daily  As needed   Prednisone taper to have on hold if symptoms persist or worsen with wheezing .  Please contact office for sooner follow up if symptoms do not improve or worsen or seek emergency care  Follow up Dr. Elsworth Soho  In 3 months and As needed       Allergic rhinitis Cont for triggers  Plan  Patient Instructions  Increase Symbicort 2 puffs Twice daily  For 2 weeks then back  2 puffs daily.  Restart Flonase 2 puffs daily .  Saline nasal rinses As needed   Mucinex DM Twice daily  As needed   Prednisone taper to have on hold if symptoms persist or worsen with wheezing .  Please contact office for sooner follow up if symptoms do not improve or worsen or seek emergency care  Follow up Dr. Elsworth Soho  In 3 months and As needed          Rexene Edison, NP 03/14/2017

## 2017-03-14 NOTE — Patient Instructions (Signed)
Increase Symbicort 2 puffs Twice daily  For 2 weeks then back  2 puffs daily.  Restart Flonase 2 puffs daily .  Saline nasal rinses As needed   Mucinex DM Twice daily  As needed   Prednisone taper to have on hold if symptoms persist or worsen with wheezing .  Please contact office for sooner follow up if symptoms do not improve or worsen or seek emergency care  Follow up Dr. Elsworth Soho  In 3 months and As needed

## 2017-03-14 NOTE — Addendum Note (Signed)
Addended by: Georjean Mode on: 03/14/2017 03:21 PM   Modules accepted: Orders

## 2017-03-14 NOTE — Assessment & Plan Note (Signed)
Cont for triggers  Plan  Patient Instructions  Increase Symbicort 2 puffs Twice daily  For 2 weeks then back  2 puffs daily.  Restart Flonase 2 puffs daily .  Saline nasal rinses As needed   Mucinex DM Twice daily  As needed   Prednisone taper to have on hold if symptoms persist or worsen with wheezing .  Please contact office for sooner follow up if symptoms do not improve or worsen or seek emergency care  Follow up Dr. Elsworth Soho  In 3 months and As needed

## 2017-03-14 NOTE — Assessment & Plan Note (Signed)
Flare  xopenex neb x 1  Steroid pack to have on hold   Plan  Patient Instructions  Increase Symbicort 2 puffs Twice daily  For 2 weeks then back  2 puffs daily.  Restart Flonase 2 puffs daily .  Saline nasal rinses As needed   Mucinex DM Twice daily  As needed   Prednisone taper to have on hold if symptoms persist or worsen with wheezing .  Please contact office for sooner follow up if symptoms do not improve or worsen or seek emergency care  Follow up Dr. Elsworth Soho  In 3 months and As needed

## 2017-03-19 DIAGNOSIS — F902 Attention-deficit hyperactivity disorder, combined type: Secondary | ICD-10-CM | POA: Diagnosis not present

## 2017-03-19 DIAGNOSIS — F422 Mixed obsessional thoughts and acts: Secondary | ICD-10-CM | POA: Diagnosis not present

## 2017-03-19 DIAGNOSIS — F410 Panic disorder [episodic paroxysmal anxiety] without agoraphobia: Secondary | ICD-10-CM | POA: Diagnosis not present

## 2017-03-25 ENCOUNTER — Encounter: Payer: Self-pay | Admitting: Family Medicine

## 2017-03-25 ENCOUNTER — Ambulatory Visit (INDEPENDENT_AMBULATORY_CARE_PROVIDER_SITE_OTHER): Payer: Medicare Other | Admitting: Family Medicine

## 2017-03-25 VITALS — BP 137/74 | HR 94 | Temp 97.9°F | Ht 62.0 in | Wt 184.0 lb

## 2017-03-25 DIAGNOSIS — R6889 Other general symptoms and signs: Secondary | ICD-10-CM | POA: Diagnosis not present

## 2017-03-25 LAB — POCT INFLUENZA A/B
INFLUENZA B, POC: NEGATIVE
Influenza A, POC: NEGATIVE

## 2017-03-25 NOTE — Patient Instructions (Signed)
Please rest for the rest of the week.  Try to rest and relax.  Make sure drinking plenty of water and running and humidifier.  I would recommend that you try not to be around others just to minimize to them as you are technically contagious, especially while running a fever.Faythe Ghee to use Tylenol and/or ibuprofen as needed for fever or pain control. Okay to use the Mucinex as needed.  Just make sure to drink plenty of water with it. Okay to use the [employer] as needed. If at any point you feel like you are not improving or if you suddenly get worse then please call us and let us know.

## 2017-03-25 NOTE — Addendum Note (Signed)
Addended by: Teddy Spike on: 03/25/2017 11:33 AM   Modules accepted: Orders

## 2017-03-25 NOTE — Progress Notes (Signed)
   Subjective:    Patient ID: [name], female    DOB: 16-Jan-1946, 72 y.o.   MRN: [id number]  HPI 72 year old female comes in today with upper respiratory symptoms that started Saturday, about 48 hours ago.  She has had some drainage and runny nose blowing out some mucus that has a green color.  She has sore throat the first day or so but that actually seems to have felt a little bit better.  She is been helping take care of her grandchildren who have also been expensing some upper respiratory symptoms and 1 of her grandchildren actually had strep throat last week..  She had a fever to 100.7 last evening.  That she is been afebrile this morning so far.  She is had a mild cough as well.  No shortness of breath.   Review of Systems     Objective:   Physical Exam  Constitutional: She is oriented to person, place, and time. She appears well-developed and well-nourished.  HENT:  Head: Normocephalic and atraumatic.  Right Ear: External ear normal.  Left Ear: External ear normal.  Nose: Nose normal.  Mouth/Throat: Oropharynx is clear and moist.  TMs and canals are clear.   Eyes: Conjunctivae and EOM are normal. Pupils are equal, round, and reactive to light.  Neck: Neck supple. No thyromegaly present.  Cardiovascular: Normal rate, regular rhythm and normal heart sounds.  Pulmonary/Chest: Effort normal and breath sounds normal. She has no wheezes.  Lymphadenopathy:    She has no cervical adenopathy.  Neurological: She is alert and oriented to person, place, and time.  Skin: Skin is warm and dry.  Psychiatric: She has a normal mood and affect.       Assessment & Plan:  Upper respiratory infection-flu swab was negative.  Suspect viral acute illness. Please rest for the rest of the week.  Try to rest and relax.  Make sure drinking plenty of water and running and humidifier.  I would recommend that you try not to be around others just to minimize to them as you are technically  contagious, especially while running a fever.Faythe Ghee to use Tylenol and/or ibuprofen as needed for fever or pain control. Okay to use the Mucinex as needed.  Just make sure to drink plenty of water with it. Okay to use the [employer] as needed. If at any point you feel like you are not improving or if you suddenly get worse then please call us and let us know.

## 2017-03-28 ENCOUNTER — Other Ambulatory Visit: Payer: Self-pay | Admitting: Family Medicine

## 2017-03-29 ENCOUNTER — Encounter: Payer: Self-pay | Admitting: Family Medicine

## 2017-03-29 ENCOUNTER — Ambulatory Visit (INDEPENDENT_AMBULATORY_CARE_PROVIDER_SITE_OTHER): Payer: Medicare Other | Admitting: Family Medicine

## 2017-03-29 VITALS — BP 120/56 | HR 87 | Temp 97.7°F | Ht 62.0 in | Wt 184.0 lb

## 2017-03-29 DIAGNOSIS — J329 Chronic sinusitis, unspecified: Secondary | ICD-10-CM | POA: Diagnosis not present

## 2017-03-29 DIAGNOSIS — J40 Bronchitis, not specified as acute or chronic: Secondary | ICD-10-CM | POA: Diagnosis not present

## 2017-03-29 MED ORDER — AMOXICILLIN-POT CLAVULANATE 875-125 MG PO TABS: 1.0000 | ORAL_TABLET | Freq: Two times a day (BID) | ORAL | 0 refills | Status: DC

## 2017-03-29 NOTE — Progress Notes (Signed)
   Subjective:    Patient ID: [name], female    DOB: 07-Mar-1945, 72 y.o.   MRN: [id number]  HPI   72 year old female is here today to follow-up for persistent cough.  She was seen on February 11, approximately 4 days ago for flulike symptoms.  It Artie had symptoms for 2 days at that point in time.  She complains of a persistent cough.  Now she is lost her voice over the last day or 2.  She is been trying to drink plenty of fluids and taking Mucinex.  She just feels very tired and falls asleep easily during the day.  She still short of breath and runs out of energy when she tries to do a lot.  She still coughing up some phlegm but she feels like it is coming more from her sinuses in her throat versus in her chest.  She still has a lot of nasal congestion and pressure.  She does report some low-grade temperatures right around 99 particularly in the evenings.  Review of Systems     Objective:   Physical Exam  Constitutional: She is oriented to person, place, and time. She appears well-developed and well-nourished.  HENT:  Head: Normocephalic and atraumatic.  Right Ear: External ear normal.  Left Ear: External ear normal.  Nose: Nose normal.  Mouth/Throat: Oropharynx is clear and moist.  TMs and canals are clear.   Eyes: Conjunctivae and EOM are normal. Pupils are equal, round, and reactive to light.  Neck: Neck supple. No thyromegaly present.  Cardiovascular: Normal rate, regular rhythm and normal heart sounds.  Pulmonary/Chest: Effort normal and breath sounds normal. She has no wheezes.  Lymphadenopathy:    She has no cervical adenopathy.  Neurological: She is alert and oriented to person, place, and time.  Skin: Skin is warm and dry.  Psychiatric: She has a normal mood and affect.          Assessment & Plan:  Sinobronchitis -at this point she is been sick for a total of 6 days.  Discussed that this still could be viral and she could be on the tail end of it versus now a  secondary bacterial infection.  I encouraged her to give a couple more days and if she is not better at that point then okay to fill the prescription for Augmentin.

## 2017-04-08 DIAGNOSIS — R5382 Chronic fatigue, unspecified: Secondary | ICD-10-CM | POA: Diagnosis not present

## 2017-04-08 DIAGNOSIS — I10 Essential (primary) hypertension: Secondary | ICD-10-CM | POA: Diagnosis not present

## 2017-04-08 DIAGNOSIS — L659 Nonscarring hair loss, unspecified: Secondary | ICD-10-CM | POA: Diagnosis not present

## 2017-04-08 DIAGNOSIS — E7800 Pure hypercholesterolemia, unspecified: Secondary | ICD-10-CM | POA: Diagnosis not present

## 2017-04-08 DIAGNOSIS — R7301 Impaired fasting glucose: Secondary | ICD-10-CM | POA: Diagnosis not present

## 2017-04-08 DIAGNOSIS — E559 Vitamin D deficiency, unspecified: Secondary | ICD-10-CM | POA: Diagnosis not present

## 2017-04-08 DIAGNOSIS — E039 Hypothyroidism, unspecified: Secondary | ICD-10-CM | POA: Diagnosis not present

## 2017-04-09 DIAGNOSIS — F902 Attention-deficit hyperactivity disorder, combined type: Secondary | ICD-10-CM | POA: Diagnosis not present

## 2017-04-09 DIAGNOSIS — F410 Panic disorder [episodic paroxysmal anxiety] without agoraphobia: Secondary | ICD-10-CM | POA: Diagnosis not present

## 2017-04-09 DIAGNOSIS — F422 Mixed obsessional thoughts and acts: Secondary | ICD-10-CM | POA: Diagnosis not present

## 2017-04-15 ENCOUNTER — Ambulatory Visit (INDEPENDENT_AMBULATORY_CARE_PROVIDER_SITE_OTHER): Payer: Medicare Other | Admitting: Family Medicine

## 2017-04-15 ENCOUNTER — Encounter: Payer: Self-pay | Admitting: Family Medicine

## 2017-04-15 VITALS — BP 130/59 | HR 113 | Wt 181.0 lb

## 2017-04-15 DIAGNOSIS — IMO0001 Reserved for inherently not codable concepts without codable children: Secondary | ICD-10-CM

## 2017-04-15 DIAGNOSIS — T6291XA Toxic effect of unspecified noxious substance eaten as food, accidental (unintentional), initial encounter: Secondary | ICD-10-CM

## 2017-04-15 MED ORDER — ONDANSETRON HCL 4 MG PO TABS
4.0000 mg | ORAL_TABLET | Freq: Three times a day (TID) | ORAL | 0 refills | Status: DC | PRN
Start: 2017-04-15 — End: 2018-08-14

## 2017-04-15 MED ORDER — PROMETHAZINE HCL 25 MG RE SUPP
25.0000 mg | Freq: Four times a day (QID) | RECTAL | 0 refills | Status: DC | PRN
Start: 2017-04-15 — End: 2017-05-20

## 2017-04-15 NOTE — Patient Instructions (Addendum)
Food Poisoning Food poisoning is an illness that is caused by eating or drinking contaminated foods or drinks. In most cases, food poisoning is mild and lasts 1-2 days. However, some cases can be serious, especially for people who have weak body defense (immune) systems, older people, children and infants, and pregnant women. What are the causes? Foods can become contaminated with viruses, bacteria, parasites, mold, or chemicals as a result of:  Poor personal hygiene, such as poor hand washing practices.  Storing food improperly, such as not refrigerating raw meat.  Using unclean surfaces for serving, preparing, and storing food.  Cooking or eating with unclean utensils.  If contaminated food is eaten, viruses, bacteria, or parasites can harm the intestine. This often causes severe diarrhea. The most common causes of food poisoning include:  Viruses, such as: ? Norovirus. ? Rotavirus.  Bacteria, such as: ? Salmonella. ? Listeria. ? E. coli (Escherichia coli).  Parasites, such as: ? Giardia. ? Toxoplasmosis.  What are the signs or symptoms? Symptoms may take several hours to appear after you consume contaminated food or drink. Symptoms include:  Nausea.  Vomiting.  Cramping.  Diarrhea.  Fever and chills.  Muscle aches.  Dehydration. Dehydration can cause you to be tired and thirsty, have a dry mouth, and urinate less frequently.  How is this diagnosed? Your health care provider can diagnose food poisoning with a medical history and physical exam. This will include asking you what you have recently eaten. You may also have tests, including:  Blood tests.  Stool tests.  How is this treated? Treatment focuses on relieving your symptoms and making sure that you are hydrated. You may also be given medicines. In severe cases, hospitalization may be required and you may need to receive fluids through an IV tube. Follow these instructions at home: Eating and  drinking   Drink enough fluids to keep your urine clear or pale yellow. You may need to drink small amounts of clear liquids frequently.  Avoid milk, caffeine, and alcohol.  Ask your health care provider for specific rehydration instructions.  Eat small, frequent meals rather than large meals. Medicines  Take over-the-counter and prescription medicines only as told by your health care provider. Ask your health care provider if you should continue to take any of your regular prescribed and over-the-counter medicines.  If you were prescribed an antibiotic medicine, take it as told by your health care provider. Do not stop taking the antibiotic even if you start to feel better. General instructions  Wash your hands thoroughly before you prepare food and after you go to the bathroom (use the toilet). Make sure people who live with you also wash their hands often.  Clean surfaces that you touch with a product that contains chlorine bleach.  Keep all follow-up visits as told by your health care provider. This is important. How is this prevented?  Wash your hands, food preparation surfaces, and utensils thoroughly before and after you handle raw foods.  Use separate food preparation surfaces and storage spaces for raw meat and for fruits and vegetables.  Keep refrigerated foods colder than 7F (5C).  Serve hot foods immediately or keep them heated above 17F (60C).  Store dry foods in cool, dry spaces away from excess heat or moisture. Throw out any foods that do not smell right or are in cans that are bulging.  Follow approved canning procedures.  Heat canned foods thoroughly before you taste them.  Drink bottled or sterile water when you travel.  Get help right away if: Call 911 or go to the emergency room if:  You have difficulty breathing, swallowing, talking, or moving.  You develop blurred vision.  You cannot eat or drink without vomiting.  You faint.  Your eyes  turn yellow.  Your vomiting or diarrhea is persistent.  Abdominal pain develops, increases, or localizes in one small area.  You have a fever.  You have blood or mucus in your stools, or your stools look dark black and tarry.  You have signs of dehydration, such as: ? Dark urine, very little urine, or no urine. ? Cracked lips. ? Not making tears while crying. ? Dry mouth. ? Sunken eyes. ? Sleepiness. ? Weakness. ? Dizziness.  This information is not intended to replace advice given to you by your health care provider. Make sure you discuss any questions you have with your health care provider. Document Released: 10/28/2003 Document Revised: 06/28/2015 Document Reviewed: 08/02/2014 Elsevier Interactive Patient Education  [employer].

## 2017-04-15 NOTE — Progress Notes (Signed)
   Subjective:    Patient ID: [name], female    DOB: [birth year], 72 y.o.   MRN: [id number]  HPI Went to dinner last night and went to [employer]. She had pork tenderloin and potatoes and wedge salad with with blue cheese dressing.   Started vomiting around 10:30 PM last night.  Then started having diarrhea early this morning.  No blood in the stool. No fever. Says her belly feels tight.  No worrsening and alleviating sxs.  She hasn't taken any meds.     Review of Systems     Objective:   Physical Exam  Constitutional: She is oriented to person, place, and time. She appears well-developed and well-nourished.  HENT:  Head: Normocephalic and atraumatic.  Right Ear: External ear normal.  Left Ear: External ear normal.  Nose: Nose normal.  Mouth/Throat: Oropharynx is clear and moist.  TMs and canals are clear.   Eyes: Conjunctivae and EOM are normal. Pupils are equal, round, and reactive to light.  Neck: Neck supple. No thyromegaly present.  Cardiovascular: Normal rate, regular rhythm and normal heart sounds.  Pulmonary/Chest: Effort normal and breath sounds normal. She has no wheezes.  Abdominal: Soft. Bowel sounds are normal. She exhibits no distension and no mass. There is tenderness. There is no rebound and no guarding.  Mild epigastric pain.    Lymphadenopathy:    She has no cervical adenopathy.  Neurological: She is alert and oriented to person, place, and time.  Skin: Skin is warm and dry.  Psychiatric: She has a normal mood and affect.       Assessment & Plan:  Food poisoning vs Gastroenteritis - given zofran and phenergan supp.  Call if not better in 24 hr.  Call if any blood in the stool. Avoid Immodium.

## 2017-04-17 ENCOUNTER — Telehealth: Payer: Self-pay

## 2017-04-17 ENCOUNTER — Telehealth: Payer: Self-pay | Admitting: Family Medicine

## 2017-04-17 NOTE — Telephone Encounter (Signed)
I agree. If still having fever tomorrow then please le me know

## 2017-04-17 NOTE — Telephone Encounter (Signed)
Pt advised of PCP recommendation, verbalized understanding. She will call back tomorrow if no better.

## 2017-04-17 NOTE — Telephone Encounter (Signed)
[name] called and states she believes she had a stomach virus. She states the vomiting and diarrhea has resolved. She has had fever up to 102 and body aches. She has not been eating much. I advised her it would be ok to eat chicken soup, bananas, apple sauce and rice. Also advised to drink plenty of fluids and take ibuprofen of tylenol for the fever and body aches.

## 2017-04-17 NOTE — Telephone Encounter (Signed)
Received fax from [employer] that Phenergan suppositories requires a PA. Information has been sent to the insurance company. Awaiting determination.

## 2017-04-18 NOTE — Telephone Encounter (Signed)
Received fax from the [facility] plan that Promethazine suppositories was approved from 01/18/2017 until 04/18/2018. Forms sent to scan. Pharmacy notified.

## 2017-04-25 DIAGNOSIS — F410 Panic disorder [episodic paroxysmal anxiety] without agoraphobia: Secondary | ICD-10-CM | POA: Diagnosis not present

## 2017-04-25 DIAGNOSIS — F422 Mixed obsessional thoughts and acts: Secondary | ICD-10-CM | POA: Diagnosis not present

## 2017-04-25 DIAGNOSIS — F902 Attention-deficit hyperactivity disorder, combined type: Secondary | ICD-10-CM | POA: Diagnosis not present

## 2017-05-04 NOTE — Progress Notes (Signed)
Reviewed & agree with plan  

## 2017-05-06 DIAGNOSIS — F422 Mixed obsessional thoughts and acts: Secondary | ICD-10-CM | POA: Diagnosis not present

## 2017-05-06 DIAGNOSIS — F410 Panic disorder [episodic paroxysmal anxiety] without agoraphobia: Secondary | ICD-10-CM | POA: Diagnosis not present

## 2017-05-06 DIAGNOSIS — F902 Attention-deficit hyperactivity disorder, combined type: Secondary | ICD-10-CM | POA: Diagnosis not present

## 2017-05-07 DIAGNOSIS — F902 Attention-deficit hyperactivity disorder, combined type: Secondary | ICD-10-CM | POA: Diagnosis not present

## 2017-05-07 DIAGNOSIS — F422 Mixed obsessional thoughts and acts: Secondary | ICD-10-CM | POA: Diagnosis not present

## 2017-05-07 DIAGNOSIS — F410 Panic disorder [episodic paroxysmal anxiety] without agoraphobia: Secondary | ICD-10-CM | POA: Diagnosis not present

## 2017-05-14 DIAGNOSIS — F902 Attention-deficit hyperactivity disorder, combined type: Secondary | ICD-10-CM | POA: Diagnosis not present

## 2017-05-14 DIAGNOSIS — F422 Mixed obsessional thoughts and acts: Secondary | ICD-10-CM | POA: Diagnosis not present

## 2017-05-14 DIAGNOSIS — F410 Panic disorder [episodic paroxysmal anxiety] without agoraphobia: Secondary | ICD-10-CM | POA: Diagnosis not present

## 2017-05-16 DIAGNOSIS — L814 Other melanin hyperpigmentation: Secondary | ICD-10-CM | POA: Diagnosis not present

## 2017-05-16 DIAGNOSIS — L820 Inflamed seborrheic keratosis: Secondary | ICD-10-CM | POA: Diagnosis not present

## 2017-05-16 DIAGNOSIS — D1801 Hemangioma of skin and subcutaneous tissue: Secondary | ICD-10-CM | POA: Diagnosis not present

## 2017-05-16 DIAGNOSIS — L821 Other seborrheic keratosis: Secondary | ICD-10-CM | POA: Diagnosis not present

## 2017-05-16 DIAGNOSIS — Z85828 Personal history of other malignant neoplasm of skin: Secondary | ICD-10-CM | POA: Diagnosis not present

## 2017-05-16 DIAGNOSIS — L579 Skin changes due to chronic exposure to nonionizing radiation, unspecified: Secondary | ICD-10-CM | POA: Diagnosis not present

## 2017-05-20 ENCOUNTER — Ambulatory Visit (INDEPENDENT_AMBULATORY_CARE_PROVIDER_SITE_OTHER): Payer: Medicare Other | Admitting: Adult Health

## 2017-05-20 ENCOUNTER — Encounter: Payer: Self-pay | Admitting: Adult Health

## 2017-05-20 DIAGNOSIS — J4531 Mild persistent asthma with (acute) exacerbation: Secondary | ICD-10-CM | POA: Diagnosis not present

## 2017-05-20 DIAGNOSIS — J309 Allergic rhinitis, unspecified: Secondary | ICD-10-CM

## 2017-05-20 MED ORDER — ALBUTEROL SULFATE HFA 108 (90 BASE) MCG/ACT IN AERS: 2.0000 | INHALATION_SPRAY | Freq: Four times a day (QID) | RESPIRATORY_TRACT | 5 refills | Status: DC | PRN

## 2017-05-20 MED ORDER — AZITHROMYCIN 250 MG PO TABS: ORAL_TABLET | ORAL | 0 refills | Status: AC

## 2017-05-20 NOTE — Patient Instructions (Addendum)
Zpack take as directed.  Increase Symbicort 2 puffs Twice daily    Saline nasal rinses As needed   Restart Flonase 2 puffs daily .  Mucinex DM Twice daily  As needed   Prednisone taper to have on hold if symptoms persist or worsen with wheezing .  Please contact office for sooner follow up if symptoms do not improve or worsen or seek emergency care  Follow up Dr. Elsworth Soho  In 3 months and As needed

## 2017-05-20 NOTE — Progress Notes (Signed)
@Patient  ID: [name], female    DOB: Feb 25, 1945, 72 y.o.   MRN: [id number]  Chief Complaint  Patient presents with  . Acute Visit    Asthma     Referring provider: Hali Marry, *  HPI: 72 yo female Followed for mild persistent asthma, allergic rhinitis and chronic cough with vocal cord dysfunction  Significant tests/ events reviewed  Spirometry 06/2013 ratio 63 FEV1 normal.  05/20/2017 Acute OV: Asthma  Patient presents for an acute office visit.  She complains of a one-week history of increased cough, congestion with thick mucus and wheezing. Having increased sinus congestion , drainage and allergy symptoms . Taking Zyrtec . Has asthma, taking Symbicort daily  Has had 3 flares over last 4 months .  No chest pain , orthopnea , edema or fever.  Going on vacation to [place]     Allergies  Allergen Reactions  . Astelin [Azelastine Hcl] Other (See Comments)    Headache  . Lisinopril     ACE cough  . Morphine     REACTION: Nausea  . Moxifloxacin     REACTION: confusion, dizziness, paranoia    Immunization History  Administered Date(s) Administered  . Influenza Split 11/07/2011, 01/12/2013  . Influenza Whole 02/12/2005, 12/02/2007, 11/24/2008  . Influenza, High Dose Seasonal PF 11/19/2016  . Influenza,inj,Quad PF,6+ Mos 01/11/2015  . Influenza-Unspecified 11/12/2013  . Pneumococcal Conjugate-13 04/26/2014  . Pneumococcal Polysaccharide-23 02/12/2001, 11/27/2001, 11/28/2006, 11/07/2011  . Td 02/12/1998, 06/13/1998  . Tdap 09/23/2012    Past Medical History:  Diagnosis Date  . Allergic rhinitis, cause unspecified   . Anxiety   . Benign paroxysmal positional vertigo   . Cancer ([place])    basal cell carcinoma per right side of nostril  . Depressive disorder, not elsewhere classified   . Diabetes mellitus without complication ([place])    noted per H&P with Dr Dianah Field 08/03/2014   . Esophageal reflux   . History of hiatal hernia   . Hypertension    . Localized osteoarthrosis not specified whether primary or secondary, lower leg   . Other and unspecified hyperlipidemia   . Other diseases of vocal cords   . Peptic ulcer, unspecified site, unspecified as acute or chronic, without mention of hemorrhage, perforation, or obstruction   . Pneumonia    bilat pneumonia 1987  . Shortness of breath dyspnea    with anxiety; with climbing stairs  . TMJ (temporomandibular joint disorder)   . Unspecified arthropathy, hand   . Unspecified asthma(493.90)    triggered with a virus   . Unspecified hypothyroidism     Tobacco History: Social History   Tobacco Use  Smoking Status Former Smoker  . Packs/day: 0.30  . Years: 2.00  . Pack years: 0.60  . Types: Cigarettes  . Last attempt to quit: 02/12/1958  . Years since quitting: 59.3  Smokeless Tobacco Never Used   Counseling given: Not Answered   Outpatient Encounter Medications as of 05/20/2017  Medication Sig  . albuterol (PROVENTIL HFA) 108 (90 Base) MCG/ACT inhaler Inhale 2 puffs into the lungs every 6 (six) hours as needed for shortness of breath.  . budesonide-formoterol (SYMBICORT) 80-4.5 MCG/ACT inhaler Inhale 2 puffs into the lungs 2 (two) times daily.  Marland Kitchen buPROPion (WELLBUTRIN XL) 150 MG 24 hr tablet Take 150 mg by mouth daily.    . Calcium Carbonate-Vit D-Min 600-400 MG-UNIT TABS Take 1 tablet by mouth 2 (two) times daily.   . clindamycin (CLEOCIN T) 1 % lotion Apply 1 application  topically 2 (two) times daily as needed (bumps).  . Famotidine (PEPCID PO) Take by mouth every evening.   . fexofenadine (ALLEGRA) 180 MG tablet Take 180 mg by mouth at bedtime.  . fluticasone (FLONASE) 50 MCG/ACT nasal spray USE 2 SPRAYS IN EACH NOSTRIL EVERY DAY  . folic acid (FOLVITE) 1 MG tablet Take 1 mg by mouth 2 (two) times daily.    . hydrochlorothiazide (HYDRODIURIL) 25 MG tablet Take 1 tablet (25 mg total) by mouth daily.  Marland Kitchen levothyroxine (SYNTHROID, LEVOTHROID) 75 MCG tablet Take 75 mcg by  mouth daily. Except Sunday 88 mcg  . levothyroxine (SYNTHROID, LEVOTHROID) 88 MCG tablet Take only sunday   . losartan (COZAAR) 50 MG tablet Take 1 tablet (50 mg total) by mouth daily.  . meloxicam (MOBIC) 15 MG tablet TAKE 1 TABLET BY MOUTH EVERY MORNING  . metroNIDAZOLE (METROGEL) 1 % gel APPLY EVERY DAY  . minoxidil (ROGAINE) 2 % external solution Apply 1 application topically 2 (two) times daily.  . ondansetron (ZOFRAN) 4 MG tablet Take 1 tablet (4 mg total) by mouth every 8 (eight) hours as needed for nausea or vomiting.  . pantoprazole (PROTONIX) 40 MG tablet Take 1 tablet (40 mg total) by mouth 2 (two) times daily. (Patient taking differently: Take 40 mg by mouth daily. )  . PARoxetine (PAXIL-CR) 12.5 MG 24 hr tablet Take 12.5 mg by mouth 2 (two) times daily.   . Saline (SIMPLY SALINE) 0.9 % AERS Place 1 spray into the nose 2 (two) times daily as needed (dry).  Marland Kitchen azithromycin (ZITHROMAX Z-PAK) 250 MG tablet Take 2 tablets (500 mg) on  Day 1,  followed by 1 tablet (250 mg) once daily on Days 2 through 5.  . [DISCONTINUED] amoxicillin-clavulanate (AUGMENTIN) 875-125 MG tablet Take 1 tablet by mouth 2 (two) times daily.  . [DISCONTINUED] Multiple Vitamins-Minerals (CENTRUM SILVER ULTRA WOMENS PO) Take 1 capsule by mouth daily.  . [DISCONTINUED] promethazine (PHENERGAN) 25 MG suppository Place 1 suppository (25 mg total) rectally every 6 (six) hours as needed for nausea or vomiting.   No facility-administered encounter medications on file as of 05/20/2017.      Review of Systems  Constitutional:   No  weight loss, night sweats,  Fevers, chills, fatigue, or  lassitude.  HEENT:   No headaches,  Difficulty swallowing,  Tooth/dental problems, or  Sore throat,                No sneezing, itching, ear ache,  +nasal congestion, post nasal drip,   CV:  No chest pain,  Orthopnea, PND, swelling in lower extremities, anasarca, dizziness, palpitations, syncope.   GI  No heartburn, indigestion,  abdominal pain, nausea, vomiting, diarrhea, change in bowel habits, loss of appetite, bloody stools.   Resp:    No chest wall deformity  Skin: no rash or lesions.  GU: no dysuria, change in color of urine, no urgency or frequency.  No flank pain, no hematuria   MS:  No joint pain or swelling.  No decreased range of motion.  No back pain.    Physical Exam  BP 140/80 (BP Location: Left Arm, Cuff Size: Normal)   Pulse 87   Ht 5\' 3"  (1.6 m)   Wt 167 lb (75.8 kg)   SpO2 97%   BMI 29.58 kg/m   GEN: A/Ox3; pleasant , NAD, elderly    HEENT:  [place]/AT,  EACs-clear, TMs-wnl, NOSE-clear drainage  THROAT-clear, no lesions, no postnasal drip or exudate noted.   NECK:  Supple w/ fair ROM; no JVD; normal carotid impulses w/o bruits; no thyromegaly or nodules palpated; no lymphadenopathy.    RESP  Clear  P & A; w/o, wheezes/ rales/ or rhonchi. no accessory muscle use, no dullness to percussion , speaks in full sentences   CARD:  RRR, no m/r/g, no peripheral edema, pulses intact, no cyanosis or clubbing.  GI:   Soft & nt; nml bowel sounds; no organomegaly or masses detected.   Musco: Warm bil, no deformities or joint swelling noted.   Neuro: alert, no focal deficits noted.    Skin: Warm, no lesions or rashes    Lab Results:  CBC  BMET   Imaging: No results found.   Assessment & Plan:   Asthma with allergic rhinitis Flare -with URI   Plan  Patient Instructions  Zpack take as directed.  Increase Symbicort 2 puffs Twice daily    Saline nasal rinses As needed   Restart Flonase 2 puffs daily .  Mucinex DM Twice daily  As needed   Prednisone taper to have on hold if symptoms persist or worsen with wheezing .  Please contact office for sooner follow up if symptoms do not improve or worsen or seek emergency care  Follow up Dr. Elsworth Soho  In 3 months and As needed       Allergic rhinitis Flare  Plan  Patient Instructions  Zpack take as directed.  Increase Symbicort 2 puffs  Twice daily    Saline nasal rinses As needed   Restart Flonase 2 puffs daily .  Mucinex DM Twice daily  As needed   Prednisone taper to have on hold if symptoms persist or worsen with wheezing .  Please contact office for sooner follow up if symptoms do not improve or worsen or seek emergency care  Follow up Dr. Elsworth Soho  In 3 months and As needed          Rexene Edison, NP 05/20/2017

## 2017-05-20 NOTE — Assessment & Plan Note (Signed)
Flare  Plan  Patient Instructions  Zpack take as directed.  Increase Symbicort 2 puffs Twice daily    Saline nasal rinses As needed   Restart Flonase 2 puffs daily .  Mucinex DM Twice daily  As needed   Prednisone taper to have on hold if symptoms persist or worsen with wheezing .  Please contact office for sooner follow up if symptoms do not improve or worsen or seek emergency care  Follow up Dr. Elsworth Soho  In 3 months and As needed

## 2017-05-20 NOTE — Assessment & Plan Note (Signed)
Flare -with URI   Plan  Patient Instructions  Zpack take as directed.  Increase Symbicort 2 puffs Twice daily    Saline nasal rinses As needed   Restart Flonase 2 puffs daily .  Mucinex DM Twice daily  As needed   Prednisone taper to have on hold if symptoms persist or worsen with wheezing .  Please contact office for sooner follow up if symptoms do not improve or worsen or seek emergency care  Follow up Dr. Elsworth Soho  In 3 months and As needed

## 2017-05-20 NOTE — Addendum Note (Signed)
Addended by: Parke Poisson E on: 05/20/2017 04:39 PM   Modules accepted: Orders

## 2017-05-22 DIAGNOSIS — M79642 Pain in left hand: Secondary | ICD-10-CM | POA: Insufficient documentation

## 2017-05-28 DIAGNOSIS — F902 Attention-deficit hyperactivity disorder, combined type: Secondary | ICD-10-CM | POA: Diagnosis not present

## 2017-05-28 DIAGNOSIS — F410 Panic disorder [episodic paroxysmal anxiety] without agoraphobia: Secondary | ICD-10-CM | POA: Diagnosis not present

## 2017-05-28 DIAGNOSIS — F422 Mixed obsessional thoughts and acts: Secondary | ICD-10-CM | POA: Diagnosis not present

## 2017-05-29 ENCOUNTER — Telehealth: Payer: Self-pay

## 2017-05-29 NOTE — Telephone Encounter (Signed)
She just finished a Z-Pak 3 days ago then she should be fine.  Typically we use a penicillin product that we can also use Z-Pak for strep throat.  If she gets any symptoms over the next week then please have her come in.

## 2017-05-29 NOTE — Telephone Encounter (Signed)
[name] called and left a message stating she has been exposed to strep by multiple family members and will continue to be around them. She did just have a Zpak that she finished 3 days ago. She is also concerned about her husband. She wants treatment. Please advise.

## 2017-05-29 NOTE — Progress Notes (Signed)
Reviewed & agree with plan  

## 2017-05-30 NOTE — Telephone Encounter (Signed)
Patient advised of recommendations.  

## 2017-06-11 ENCOUNTER — Ambulatory Visit: Payer: Medicare Other | Admitting: Podiatry

## 2017-06-15 ENCOUNTER — Other Ambulatory Visit: Payer: Self-pay

## 2017-06-15 ENCOUNTER — Emergency Department (INDEPENDENT_AMBULATORY_CARE_PROVIDER_SITE_OTHER)
Admission: EM | Admit: 2017-06-15 | Discharge: 2017-06-15 | Disposition: A | Discharge status: Home / Self Care | Payer: Medicare Other | Source: Home / Self Care | Attending: Family Medicine | Admitting: Family Medicine

## 2017-06-15 ENCOUNTER — Encounter: Payer: Self-pay | Admitting: *Deleted

## 2017-06-15 DIAGNOSIS — J029 Acute pharyngitis, unspecified: Secondary | ICD-10-CM

## 2017-06-15 DIAGNOSIS — Z20818 Contact with and (suspected) exposure to other bacterial communicable diseases: Secondary | ICD-10-CM

## 2017-06-15 DIAGNOSIS — J040 Acute laryngitis: Secondary | ICD-10-CM

## 2017-06-15 LAB — POCT RAPID STREP A (OFFICE): Rapid Strep A Screen: NEGATIVE

## 2017-06-15 NOTE — ED Provider Notes (Signed)
[name]    CSN: [id number] Arrival date & time: 06/15/17  1700     History   Chief Complaint Chief Complaint  Patient presents with  . Sore Throat    HPI [name] is a 72 y.o. female.   HPI  [name] is a 72 y.o. female presenting to UC with c/o intermittent sore throat for the last 2 weeks with associated hoarse voice since yesterday.  Pt has 6 grandchildren and reports several of them have had strep throat over the last few weeks. Her husband encouraged her to be evaluated today. Throat pain is mild at this time, worse with swallowing. Denies fever, chills, cough or congestion. Denies HA or abdominal pain. No rash.   Past Medical History:  Diagnosis Date  . Allergic rhinitis, cause unspecified   . Anxiety   . Benign paroxysmal positional vertigo   . Cancer ([place])    basal cell carcinoma per right side of nostril  . Depressive disorder, not elsewhere classified   . Diabetes mellitus without complication ([place])    noted per H&P with Dr Dianah Field 08/03/2014   . Esophageal reflux   . History of hiatal hernia   . Hypertension   . Localized osteoarthrosis not specified whether primary or secondary, lower leg   . Other and unspecified hyperlipidemia   . Other diseases of vocal cords   . Peptic ulcer, unspecified site, unspecified as acute or chronic, without mention of hemorrhage, perforation, or obstruction   . Pneumonia    bilat pneumonia 1987  . Shortness of breath dyspnea    with anxiety; with climbing stairs  . TMJ (temporomandibular joint disorder)   . Unspecified arthropathy, hand   . Unspecified asthma(493.90)    triggered with a virus   . Unspecified hypothyroidism     Patient Active Problem List   Diagnosis Date Noted  . Fatty liver 11/09/2016  . Tendon nodule 01/16/2016  . Venous stasis ulcer of left lower extremity ([place]) 03/03/2015  . S/P right THA, AA 08/10/2014  . Primary osteoarthritis of right hip 05/27/2014  .  Essential hypertension, benign 05/26/2014  . IFG (impaired fasting glucose) 01/25/2014  . Dyspnea 06/26/2013  . Vitreous detachment 09/23/2012  . Oral herpes 06/23/2012  . OCD (obsessive compulsive disorder) 05/09/2012  . Atypical ductal hyperplasia of breast 10/29/2011  . Abnormal mammogram with microcalcification 10/04/2011  . Obesity (BMI 30-39.9) 05/03/2011  . CARRIER/SUSPECTED CARRIER GROUP B STREPTOCOCCUS 11/17/2010  . Abnormal liver enzymes 10/25/2010  . SOMNOLENCE 11/25/2009  . Rheumatoid arthritis with rheumatoid factor ([place]) 11/30/2008  . POSTMENOPAUSAL STATUS 11/30/2008  . ARTHRITIS, HANDS, BILATERAL 12/22/2007  . LOC OSTEOARTHROS NOT SPEC PRIM/SEC LOWER LEG 03/19/2007  . Allergic rhinitis 11/05/2006  . VOCAL CORD DISORDER 11/05/2006  . Asthma with allergic rhinitis 11/05/2006  . Hypothyroidism 09/24/2006  . DEPRESSION 09/24/2006  . VERTIGO, BENIGN PAROXYSMAL POSITION 09/24/2006  . GERD 09/24/2006  . HYPERLIPIDEMIA 09/23/2006    Past Surgical History:  Procedure Laterality Date  . BREAST BIOPSY  09/07/2011   High Risk Lesion  . BREAST EXCISIONAL BIOPSY Left   . BREAST SURGERY     LUMPECTOMY / LEFT 10/12/2011  . KNEE ARTHROSCOPY     bilat   . NASAL SINUS SURGERY    . ROTATOR CUFF REPAIR     right   . thumb surgery      left hand 45 years ago   . TOTAL HIP ARTHROPLASTY Right 08/10/2014   Procedure: RIGHT TOTAL HIP ARTHROPLASTY ANTERIOR APPROACH;  Surgeon: Paralee Cancel, MD;  Location: [facility];  Service: Orthopedics;  Laterality: Right;    OB History   None      Home Medications    Prior to Admission medications   Medication Sig Start Date End Date Taking? Authorizing Provider  albuterol (PROVENTIL HFA) 108 (90 Base) MCG/ACT inhaler Inhale 2 puffs into the lungs every 6 (six) hours as needed for shortness of breath. 05/20/17   Parrett, Fonnie Mu, NP  budesonide-formoterol (SYMBICORT) 80-4.5 MCG/ACT inhaler Inhale 2 puffs into the lungs 2 (two) times daily.     [provider]  buPROPion (WELLBUTRIN XL) 150 MG 24 hr tablet Take 150 mg by mouth daily.      [provider]  Calcium Carbonate-Vit D-Min 600-400 MG-UNIT TABS Take 1 tablet by mouth 2 (two) times daily.     [provider]  clindamycin (CLEOCIN T) 1 % lotion Apply 1 application topically 2 (two) times daily as needed (bumps). 06/08/16   Hali Marry, MD  Famotidine (PEPCID PO) Take by mouth every evening.     [provider]  fexofenadine (ALLEGRA) 180 MG tablet Take 180 mg by mouth at bedtime.    [provider]  fluticasone (FLONASE) 50 MCG/ACT nasal spray USE 2 SPRAYS IN EACH NOSTRIL EVERY DAY 12/03/16   Parrett, Fonnie Mu, NP  folic acid (FOLVITE) 1 MG tablet Take 1 mg by mouth 2 (two) times daily.      [provider]  hydrochlorothiazide (HYDRODIURIL) 25 MG tablet Take 1 tablet (25 mg total) by mouth daily. 11/19/16   Hali Marry, MD  levothyroxine (SYNTHROID, LEVOTHROID) 75 MCG tablet Take 75 mcg by mouth daily. Except Sunday 88 mcg    [provider]  levothyroxine (SYNTHROID, LEVOTHROID) 88 MCG tablet Take only sunday     [provider]  losartan (COZAAR) 50 MG tablet Take 1 tablet (50 mg total) by mouth daily. 11/19/16   Hali Marry, MD  meloxicam (MOBIC) 15 MG tablet TAKE 1 TABLET BY MOUTH EVERY MORNING 03/28/17   Hali Marry, MD  metroNIDAZOLE (METROGEL) 1 % gel APPLY EVERY DAY 10/04/15   Hali Marry, MD  minoxidil (ROGAINE) 2 % external solution Apply 1 application topically 2 (two) times daily.    [provider]  ondansetron (ZOFRAN) 4 MG tablet Take 1 tablet (4 mg total) by mouth every 8 (eight) hours as needed for nausea or vomiting. 04/15/17   Hali Marry, MD  pantoprazole (PROTONIX) 40 MG tablet Take 1 tablet (40 mg total) by mouth 2 (two) times daily. Patient taking differently: Take 40 mg by mouth daily.  11/29/16   Hali Marry, MD    PARoxetine (PAXIL-CR) 12.5 MG 24 hr tablet Take 12.5 mg by mouth 2 (two) times daily.     [provider]  Saline (SIMPLY SALINE) 0.9 % AERS Place 1 spray into the nose 2 (two) times daily as needed (dry).    [provider]    Family History Family History  Problem Relation Age of Onset  . Heart disease Father   . Colon cancer Mother   . Liver cancer Brother   . Asthma Brother     Social History Social History   Tobacco Use  . Smoking status: Former Smoker    Packs/day: 0.30    Years: 2.00    Pack years: 0.60    Types: Cigarettes    Last attempt to quit: 02/12/1958    Years since  quitting: 59.3  . Smokeless tobacco: Never Used  Substance Use Topics  . Alcohol use: Yes    Comment: wine socially  . Drug use: No     Allergies   Astelin [azelastine hcl]; Lisinopril; Morphine; and Moxifloxacin   Review of Systems Review of Systems  Constitutional: Negative for chills and fever.  HENT: Positive for sore throat and voice change. Negative for congestion, ear pain, rhinorrhea and trouble swallowing.   Respiratory: Negative for cough and wheezing.   Gastrointestinal: Negative for diarrhea, nausea and vomiting.  Musculoskeletal: Negative for arthralgias and myalgias.  Skin: Negative for rash.     Physical Exam Triage Vital Signs ED Triage Vitals [06/15/17 1721]  Enc Vitals Group     BP 130/77     Pulse Rate 91     Resp 16     Temp 98.2 F (36.8 C)     Temp Source Oral     SpO2 98 %     Weight 185 lb (83.9 kg)     Height      Head Circumference      Peak Flow      Pain Score 0     Pain Loc      Pain Edu?      Excl. in [place]?    No data found.  Updated Vital Signs BP 130/77 (BP Location: Right Arm)   Pulse 91   Temp 98.2 F (36.8 C) (Oral)   Resp 16   Wt 185 lb (83.9 kg)   SpO2 98%   BMI 32.77 kg/m   Visual Acuity Right Eye Distance:   Left Eye Distance:   Bilateral Distance:    Right Eye Near:   Left Eye Near:    Bilateral  Near:     Physical Exam  Constitutional: She is oriented to person, place, and time. She appears well-developed and well-nourished.  Non-toxic appearance. She does not appear ill. No distress.  HENT:  Head: Normocephalic and atraumatic.  Right Ear: Tympanic membrane normal.  Left Ear: Tympanic membrane normal.  Nose: Nose normal.  Mouth/Throat: Uvula is midline, oropharynx is clear and moist and mucous membranes are normal.  Mildly hoarse voice at times.  Eyes: EOM are normal.  Neck: Normal range of motion. Neck supple.  Cardiovascular: Normal rate and regular rhythm.  Pulmonary/Chest: Effort normal and breath sounds normal. No stridor. No respiratory distress. She has no wheezes.  Musculoskeletal: Normal range of motion.  Lymphadenopathy:    She has no cervical adenopathy.  Neurological: She is alert and oriented to person, place, and time.  Skin: Skin is warm and dry. No rash noted.  Psychiatric: She has a normal mood and affect. Her behavior is normal.  Nursing note and vitals reviewed.    UC Treatments / Results  Labs (all labs ordered are listed, but only abnormal results are displayed) Labs Reviewed  STREP A DNA PROBE  POCT RAPID STREP A (OFFICE)    EKG None  Radiology No results found.  Procedures Procedures (including critical care time)  Medications Ordered in UC Medications - No data to display  Initial Impression / Assessment and Plan / UC Course  I have reviewed the triage vital signs and the nursing notes.  Pertinent labs & imaging results that were available during my care of the patient were reviewed by me and considered in my medical decision making (see chart for details).     Hx and exam c/w viral illnes Encouraged symptomatic treatment. Culture sent.  Final Clinical Impressions(s) / UC Diagnoses   Final diagnoses:  Sore throat  Laryngitis  Exposure to strep throat     Discharge Instructions      Your rapid strep test was Negative  today, however, a more detailed test known as a culture has been sent to the lab for more testing. This takes about 24-48 hours.  If the culture comes back Positive, we will let you know and call in the appropriate antibiotic for you to your preferred pharmacy. If it is negative, your symptoms are likely from a virus and should resolve within 1 week. If you develop trouble breathing or swallowing liquids, please be re-evaluated by a medical provider.   You may take 500mg  acetaminophen every 4-6 hours or in combination with ibuprofen 400-600mg  every 6-8 hours as needed for pain, inflammation, and fever.  Be sure to drink at least eight 8oz glasses of water to stay well hydrated and get at least 8 hours of sleep at night, preferably more while sick.      ED Prescriptions    None     Controlled Substance Prescriptions [place] Controlled Substance Registry consulted? Not Applicable   Tyrell Antonio 06/16/17 1131

## 2017-06-15 NOTE — Discharge Instructions (Signed)
°  Your rapid strep test was Negative today, however, a more detailed test known as a culture has been sent to the lab for more testing. This takes about 24-48 hours.  If the culture comes back Positive, we will let you know and call in the appropriate antibiotic for you to your preferred pharmacy. If it is negative, your symptoms are likely from a virus and should resolve within 1 week. If you develop trouble breathing or swallowing liquids, please be re-evaluated by a medical provider.   You may take 500mg  acetaminophen every 4-6 hours or in combination with ibuprofen 400-600mg  every 6-8 hours as needed for pain, inflammation, and fever.  Be sure to drink at least eight 8oz glasses of water to stay well hydrated and get at least 8 hours of sleep at night, preferably more while sick.

## 2017-06-15 NOTE — ED Triage Notes (Signed)
Patient c/o sore throat intermittent x 2 weeks. Grandchildren + for strep.

## 2017-06-17 ENCOUNTER — Telehealth: Payer: Self-pay | Admitting: Emergency Medicine

## 2017-06-17 LAB — STREP A DNA PROBE: Group A Strep Probe: NOT DETECTED

## 2017-07-06 ENCOUNTER — Other Ambulatory Visit: Payer: Self-pay | Admitting: Family Medicine

## 2017-07-09 DIAGNOSIS — F410 Panic disorder [episodic paroxysmal anxiety] without agoraphobia: Secondary | ICD-10-CM | POA: Diagnosis not present

## 2017-07-09 DIAGNOSIS — F422 Mixed obsessional thoughts and acts: Secondary | ICD-10-CM | POA: Diagnosis not present

## 2017-07-09 DIAGNOSIS — F902 Attention-deficit hyperactivity disorder, combined type: Secondary | ICD-10-CM | POA: Diagnosis not present

## 2017-07-11 DIAGNOSIS — F902 Attention-deficit hyperactivity disorder, combined type: Secondary | ICD-10-CM | POA: Diagnosis not present

## 2017-07-11 DIAGNOSIS — F422 Mixed obsessional thoughts and acts: Secondary | ICD-10-CM | POA: Diagnosis not present

## 2017-07-11 DIAGNOSIS — F410 Panic disorder [episodic paroxysmal anxiety] without agoraphobia: Secondary | ICD-10-CM | POA: Diagnosis not present

## 2017-07-29 DIAGNOSIS — F410 Panic disorder [episodic paroxysmal anxiety] without agoraphobia: Secondary | ICD-10-CM | POA: Diagnosis not present

## 2017-07-29 DIAGNOSIS — F902 Attention-deficit hyperactivity disorder, combined type: Secondary | ICD-10-CM | POA: Diagnosis not present

## 2017-07-29 DIAGNOSIS — F422 Mixed obsessional thoughts and acts: Secondary | ICD-10-CM | POA: Diagnosis not present

## 2017-07-30 DIAGNOSIS — E559 Vitamin D deficiency, unspecified: Secondary | ICD-10-CM | POA: Diagnosis not present

## 2017-07-30 DIAGNOSIS — E7800 Pure hypercholesterolemia, unspecified: Secondary | ICD-10-CM | POA: Diagnosis not present

## 2017-07-30 DIAGNOSIS — F410 Panic disorder [episodic paroxysmal anxiety] without agoraphobia: Secondary | ICD-10-CM | POA: Diagnosis not present

## 2017-07-30 DIAGNOSIS — E039 Hypothyroidism, unspecified: Secondary | ICD-10-CM | POA: Diagnosis not present

## 2017-07-30 DIAGNOSIS — F902 Attention-deficit hyperactivity disorder, combined type: Secondary | ICD-10-CM | POA: Diagnosis not present

## 2017-07-30 DIAGNOSIS — R7301 Impaired fasting glucose: Secondary | ICD-10-CM | POA: Diagnosis not present

## 2017-07-30 DIAGNOSIS — F422 Mixed obsessional thoughts and acts: Secondary | ICD-10-CM | POA: Diagnosis not present

## 2017-08-05 DIAGNOSIS — R5382 Chronic fatigue, unspecified: Secondary | ICD-10-CM | POA: Diagnosis not present

## 2017-08-05 DIAGNOSIS — E7800 Pure hypercholesterolemia, unspecified: Secondary | ICD-10-CM | POA: Diagnosis not present

## 2017-08-05 DIAGNOSIS — Z833 Family history of diabetes mellitus: Secondary | ICD-10-CM | POA: Diagnosis not present

## 2017-08-05 DIAGNOSIS — Z87891 Personal history of nicotine dependence: Secondary | ICD-10-CM | POA: Diagnosis not present

## 2017-08-05 DIAGNOSIS — E039 Hypothyroidism, unspecified: Secondary | ICD-10-CM | POA: Diagnosis not present

## 2017-08-05 DIAGNOSIS — I10 Essential (primary) hypertension: Secondary | ICD-10-CM | POA: Diagnosis not present

## 2017-08-05 DIAGNOSIS — E559 Vitamin D deficiency, unspecified: Secondary | ICD-10-CM | POA: Diagnosis not present

## 2017-08-05 DIAGNOSIS — R7301 Impaired fasting glucose: Secondary | ICD-10-CM | POA: Diagnosis not present

## 2017-08-05 DIAGNOSIS — R7303 Prediabetes: Secondary | ICD-10-CM | POA: Diagnosis not present

## 2017-08-05 DIAGNOSIS — L659 Nonscarring hair loss, unspecified: Secondary | ICD-10-CM | POA: Diagnosis not present

## 2017-08-05 DIAGNOSIS — Z6833 Body mass index (BMI) 33.0-33.9, adult: Secondary | ICD-10-CM | POA: Diagnosis not present

## 2017-08-05 DIAGNOSIS — E669 Obesity, unspecified: Secondary | ICD-10-CM | POA: Diagnosis not present

## 2017-08-10 ENCOUNTER — Other Ambulatory Visit: Payer: Self-pay | Admitting: Pulmonary Disease

## 2017-08-12 DIAGNOSIS — F902 Attention-deficit hyperactivity disorder, combined type: Secondary | ICD-10-CM | POA: Diagnosis not present

## 2017-08-12 DIAGNOSIS — F410 Panic disorder [episodic paroxysmal anxiety] without agoraphobia: Secondary | ICD-10-CM | POA: Diagnosis not present

## 2017-08-12 DIAGNOSIS — F422 Mixed obsessional thoughts and acts: Secondary | ICD-10-CM | POA: Diagnosis not present

## 2017-08-13 DIAGNOSIS — F410 Panic disorder [episodic paroxysmal anxiety] without agoraphobia: Secondary | ICD-10-CM | POA: Diagnosis not present

## 2017-08-13 DIAGNOSIS — F902 Attention-deficit hyperactivity disorder, combined type: Secondary | ICD-10-CM | POA: Diagnosis not present

## 2017-08-13 DIAGNOSIS — F422 Mixed obsessional thoughts and acts: Secondary | ICD-10-CM | POA: Diagnosis not present

## 2017-08-23 ENCOUNTER — Emergency Department (INDEPENDENT_AMBULATORY_CARE_PROVIDER_SITE_OTHER)
Admission: EM | Admit: 2017-08-23 | Discharge: 2017-08-23 | Disposition: A | Discharge status: Home / Self Care | Payer: Medicare Other | Source: Home / Self Care | Attending: Family Medicine | Admitting: Family Medicine

## 2017-08-23 ENCOUNTER — Other Ambulatory Visit: Payer: Self-pay

## 2017-08-23 DIAGNOSIS — Z0189 Encounter for other specified special examinations: Secondary | ICD-10-CM | POA: Diagnosis not present

## 2017-08-23 DIAGNOSIS — R51 Headache: Secondary | ICD-10-CM

## 2017-08-23 DIAGNOSIS — R519 Headache, unspecified: Secondary | ICD-10-CM

## 2017-08-23 LAB — POCT CBC W AUTO DIFF (K'VILLE URGENT CARE)

## 2017-08-23 MED ORDER — PREDNISONE 20 MG PO TABS: ORAL_TABLET | ORAL | 0 refills | Status: DC

## 2017-08-23 NOTE — ED Triage Notes (Signed)
Pt c/o intermittent radiating head pain that radiates from her RT ear. Thought it had to do with her TMJ but says its getting worse. Used a heating pad, no relief.

## 2017-08-23 NOTE — Discharge Instructions (Addendum)
May take Tylenol as needed for pain. Call if rash develops.

## 2017-08-23 NOTE — ED Provider Notes (Signed)
[name]    CSN: [id number] Arrival date & time: 08/23/17  1318     History   Chief Complaint Chief Complaint  Patient presents with  . Otalgia    right  . Headache    intermittent, radiating up from ear    HPI [name] is a 72 y.o. female.   Patient complains of two day history of intermittent shock-like pinching pain in her right head that radiates to her right ear.  She has a history of TMJ pain but notes that this pain is different.  No recent sinus congestion.  No fevers, chills, and sweats.  She notes no new vision changes.  No rash.  The pain has not improved with application of a heating pad.  The history is provided by the patient.  Headache  Pain location:  R temporal Quality:  Stabbing Radiates to:  Does not radiate Severity currently:  0/10 Severity at highest:  7/10 Onset quality:  Sudden Duration:  2 days Timing:  Intermittent Progression:  Unchanged Similar to prior headaches: no   Context comment:  Occurs spontaneously Relieved by:  Nothing Worsened by:  Nothing Ineffective treatments: heating pad. Associated symptoms: ear pain   Associated symptoms: no blurred vision, no congestion, no drainage, no eye pain, no facial pain, no fatigue, no fever, no focal weakness, no hearing loss, no loss of balance, no myalgias, no nausea, no neck pain, no neck stiffness, no numbness, no paresthesias, no photophobia, no sinus pressure, no sore throat, no swollen glands, no syncope, no URI, no visual change and no weakness     Past Medical History:  Diagnosis Date  . Allergic rhinitis, cause unspecified   . Anxiety   . Benign paroxysmal positional vertigo   . Cancer ([place])    basal cell carcinoma per right side of nostril  . Depressive disorder, not elsewhere classified   . Diabetes mellitus without complication ([place])    noted per H&P with Dr Dianah Field 08/03/2014   . Esophageal reflux   . History of hiatal hernia   . Hypertension   .  Localized osteoarthrosis not specified whether primary or secondary, lower leg   . Other and unspecified hyperlipidemia   . Other diseases of vocal cords   . Peptic ulcer, unspecified site, unspecified as acute or chronic, without mention of hemorrhage, perforation, or obstruction   . Pneumonia    bilat pneumonia 1987  . Shortness of breath dyspnea    with anxiety; with climbing stairs  . TMJ (temporomandibular joint disorder)   . Unspecified arthropathy, hand   . Unspecified asthma(493.90)    triggered with a virus   . Unspecified hypothyroidism     Patient Active Problem List   Diagnosis Date Noted  . Fatty liver 11/09/2016  . Tendon nodule 01/16/2016  . Venous stasis ulcer of left lower extremity ([place]) 03/03/2015  . S/P right THA, AA 08/10/2014  . Primary osteoarthritis of right hip 05/27/2014  . Essential hypertension, benign 05/26/2014  . IFG (impaired fasting glucose) 01/25/2014  . Dyspnea 06/26/2013  . Vitreous detachment 09/23/2012  . Oral herpes 06/23/2012  . OCD (obsessive compulsive disorder) 05/09/2012  . Atypical ductal hyperplasia of breast 10/29/2011  . Abnormal mammogram with microcalcification 10/04/2011  . Obesity (BMI 30-39.9) 05/03/2011  . CARRIER/SUSPECTED CARRIER GROUP B STREPTOCOCCUS 11/17/2010  . Abnormal liver enzymes 10/25/2010  . SOMNOLENCE 11/25/2009  . Rheumatoid arthritis with rheumatoid factor ([place]) 11/30/2008  . POSTMENOPAUSAL STATUS 11/30/2008  . ARTHRITIS, HANDS, BILATERAL 12/22/2007  .  LOC OSTEOARTHROS NOT SPEC PRIM/SEC LOWER LEG 03/19/2007  . Allergic rhinitis 11/05/2006  . VOCAL CORD DISORDER 11/05/2006  . Asthma with allergic rhinitis 11/05/2006  . Hypothyroidism 09/24/2006  . DEPRESSION 09/24/2006  . VERTIGO, BENIGN PAROXYSMAL POSITION 09/24/2006  . GERD 09/24/2006  . HYPERLIPIDEMIA 09/23/2006    Past Surgical History:  Procedure Laterality Date  . BREAST BIOPSY  09/07/2011   High Risk Lesion  . BREAST EXCISIONAL BIOPSY Left     . BREAST SURGERY     LUMPECTOMY / LEFT 10/12/2011  . KNEE ARTHROSCOPY     bilat   . NASAL SINUS SURGERY    . ROTATOR CUFF REPAIR     right   . thumb surgery      left hand 45 years ago   . TOTAL HIP ARTHROPLASTY Right 08/10/2014   Procedure: RIGHT TOTAL HIP ARTHROPLASTY ANTERIOR APPROACH;  Surgeon: Paralee Cancel, MD;  Location: [facility];  Service: Orthopedics;  Laterality: Right;    OB History   None      Home Medications    Prior to Admission medications   Medication Sig Start Date End Date Taking? Authorizing Provider  albuterol (PROVENTIL HFA) 108 (90 Base) MCG/ACT inhaler Inhale 2 puffs into the lungs every 6 (six) hours as needed for shortness of breath. 05/20/17   Parrett, Fonnie Mu, NP  budesonide-formoterol (SYMBICORT) 80-4.5 MCG/ACT inhaler Inhale 2 puffs into the lungs 2 (two) times daily.    [provider]  buPROPion (WELLBUTRIN XL) 150 MG 24 hr tablet Take 150 mg by mouth daily.      [provider]  Calcium Carbonate-Vit D-Min 600-400 MG-UNIT TABS Take 1 tablet by mouth 2 (two) times daily.     [provider]  clindamycin (CLEOCIN T) 1 % lotion Apply 1 application topically 2 (two) times daily as needed (bumps). 06/08/16   Hali Marry, MD  Famotidine (PEPCID PO) Take by mouth every evening.     [provider]  fexofenadine (ALLEGRA) 180 MG tablet Take 180 mg by mouth at bedtime.    [provider]  fluticasone (FLONASE) 50 MCG/ACT nasal spray USE 2 SPRAYS IN EACH NOSTRIL EVERY DAY 12/03/16   Parrett, Fonnie Mu, NP  folic acid (FOLVITE) 1 MG tablet Take 1 mg by mouth 2 (two) times daily.      [provider]  hydrochlorothiazide (HYDRODIURIL) 25 MG tablet TAKE 1 TABLET BY MOUTH EVERY DAY 07/08/17   Hali Marry, MD  levothyroxine (SYNTHROID, LEVOTHROID) 75 MCG tablet Take 75 mcg by mouth daily. Except Sunday 88 mcg    [provider]  levothyroxine (SYNTHROID, LEVOTHROID) 88 MCG tablet Take only  sunday     [provider]  losartan (COZAAR) 50 MG tablet Take 1 tablet (50 mg total) by mouth daily. 11/19/16   Hali Marry, MD  meloxicam (MOBIC) 15 MG tablet TAKE 1 TABLET BY MOUTH EVERY MORNING 03/28/17   Hali Marry, MD  metroNIDAZOLE (METROGEL) 1 % gel APPLY EVERY DAY 10/04/15   Hali Marry, MD  minoxidil (ROGAINE) 2 % external solution Apply 1 application topically 2 (two) times daily.    [provider]  ondansetron (ZOFRAN) 4 MG tablet Take 1 tablet (4 mg total) by mouth every 8 (eight) hours as needed for nausea or vomiting. 04/15/17   Hali Marry, MD  pantoprazole (PROTONIX) 40 MG tablet Take 1 tablet (40 mg total) by mouth 2 (two) times daily. Patient taking differently: Take 40 mg by  mouth daily.  11/29/16   Hali Marry, MD  PARoxetine (PAXIL-CR) 12.5 MG 24 hr tablet Take 12.5 mg by mouth 2 (two) times daily.     [provider]  predniSONE (DELTASONE) 20 MG tablet Take one tab by mouth twice daily for 5 days, then one daily for 3 days. Take with food. 08/23/17   Kandra Nicolas, MD  Saline (SIMPLY SALINE) 0.9 % AERS Place 1 spray into the nose 2 (two) times daily as needed (dry).    [provider]  SYMBICORT 80-4.5 MCG/ACT inhaler INHALE 2 PUFFS TWICE A DAY 08/12/17   Rigoberto Noel, MD    Family History Family History  Problem Relation Age of Onset  . Heart disease Father   . Colon cancer Mother   . Liver cancer Brother   . Asthma Brother     Social History Social History   Tobacco Use  . Smoking status: Former Smoker    Packs/day: 0.30    Years: 2.00    Pack years: 0.60    Types: Cigarettes    Last attempt to quit: 02/12/1958    Years since quitting: 59.5  . Smokeless tobacco: Never Used  Substance Use Topics  . Alcohol use: Yes    Comment: wine socially  . Drug use: No     Allergies   Astelin [azelastine hcl]; Lisinopril; Morphine; and Moxifloxacin   Review of Systems Review  of Systems  Constitutional: Negative for chills, diaphoresis, fatigue and fever.  HENT: Positive for ear pain. Negative for congestion, facial swelling, hearing loss, postnasal drip, sinus pressure and sore throat.   Eyes: Negative for blurred vision, photophobia, pain, redness and visual disturbance.  Cardiovascular: Negative for syncope.  Gastrointestinal: Negative for nausea.  Musculoskeletal: Negative for myalgias, neck pain and neck stiffness.  Neurological: Positive for headaches. Negative for focal weakness, weakness, numbness, paresthesias and loss of balance.  All other systems reviewed and are negative.    Physical Exam Triage Vital Signs ED Triage Vitals  Enc Vitals Group     BP 08/23/17 1334 133/76     Pulse Rate 08/23/17 1334 82     Resp --      Temp 08/23/17 1334 98.6 F (37 C)     Temp Source 08/23/17 1334 Oral     SpO2 08/23/17 1334 95 %     Weight 08/23/17 1335 185 lb (83.9 kg)     Height 08/23/17 1335 5\' 2"  (1.575 m)     Head Circumference --      Peak Flow --      Pain Score 08/23/17 1335 7     Pain Loc --      Pain Edu? --      Excl. in [place]? --    No data found.  Updated Vital Signs BP 133/76 (BP Location: Right Arm)   Pulse 82   Temp 98.6 F (37 C) (Oral)   Ht 5\' 2"  (1.575 m)   Wt 185 lb (83.9 kg)   SpO2 95%   BMI 33.84 kg/m   Visual Acuity Right Eye Distance:   Left Eye Distance:   Bilateral Distance:    Right Eye Near:   Left Eye Near:    Bilateral Near:     Physical Exam  Constitutional: She appears well-developed and well-nourished. She does not appear ill.  HENT:  Head: Normocephalic.    Right Ear: Tympanic membrane, external ear and ear canal normal.  Left Ear: Tympanic membrane, external ear and  ear canal normal.  Nose: Nose normal.  Mouth/Throat: Oropharynx is clear and moist.  There is mild tenderness to palpation over the right temporal artery and right parietal area as noted on diagram.   There is mild tenderness to  palpation over right temporomandibular joint but palpation does not recreate her pain.  Eyes: Pupils are equal, round, and reactive to light. EOM are normal.  Neck: Neck supple.  Cardiovascular: Normal heart sounds.  Pulmonary/Chest: Breath sounds normal.  Abdominal: There is no tenderness.  Musculoskeletal: She exhibits no edema.  Lymphadenopathy:    She has no cervical adenopathy.  Neurological: She is alert. She displays normal reflexes. No cranial nerve deficit. Coordination normal.  Skin: Skin is warm and dry. No rash noted.  Nursing note reviewed.    UC Treatments / Results  Labs (all labs ordered are listed, but only abnormal results are displayed) Labs Reviewed  SEDIMENTATION RATE  C-REACTIVE PROTEIN  POCT CBC W AUTO DIFF ([facility]):  WBC 5.0; LY 30.3; MO 8.9; GR 60.8; Hgb 12.0; Platelets 292     EKG None  Radiology No results found.  Procedures Procedures (including critical care time)  Medications Ordered in UC Medications - No data to display  Initial Impression / Assessment and Plan / UC Course  I have reviewed the triage vital signs and the nursing notes.  Pertinent labs & imaging results that were available during my care of the patient were reviewed by me and considered in my medical decision making (see chart for details).    ?temporal arteritis.  Check CMP and sed rate.  CBC normal. ?early shingles (call if rash develops; would start Valtrex) Followup with Family Doctor in about 5 days.   Final Clinical Impressions(s) / UC Diagnoses   Final diagnoses:  Right temporal headache     Discharge Instructions     May take Tylenol as needed for pain. Call if rash develops.   ED Prescriptions    Medication Sig Dispense Auth. Provider   predniSONE (DELTASONE) 20 MG tablet Take one tab by mouth twice daily for 5 days, then one daily for 3 days. Take with food. 13 tablet Kandra Nicolas, MD        Kandra Nicolas, MD 08/30/17  [phone]

## 2017-08-24 ENCOUNTER — Telehealth: Payer: Self-pay | Admitting: Emergency Medicine

## 2017-08-24 LAB — C-REACTIVE PROTEIN: CRP: 5.4 mg/L (ref <8.0)

## 2017-08-24 LAB — SEDIMENTATION RATE: Sed Rate: 28 mm/h (ref 0–30)

## 2017-08-24 NOTE — Telephone Encounter (Signed)
Spoke with patient via telephone and gave her lab results. She states that she is doing much better and the prednisone is working well.

## 2017-08-29 ENCOUNTER — Ambulatory Visit (INDEPENDENT_AMBULATORY_CARE_PROVIDER_SITE_OTHER): Payer: Medicare Other | Admitting: Family Medicine

## 2017-08-29 ENCOUNTER — Encounter: Payer: Self-pay | Admitting: Family Medicine

## 2017-08-29 VITALS — BP 129/66 | HR 79 | Ht 67.0 in | Wt 184.0 lb

## 2017-08-29 DIAGNOSIS — M722 Plantar fascial fibromatosis: Secondary | ICD-10-CM | POA: Diagnosis not present

## 2017-08-29 DIAGNOSIS — R51 Headache: Secondary | ICD-10-CM | POA: Diagnosis not present

## 2017-08-29 DIAGNOSIS — F439 Reaction to severe stress, unspecified: Secondary | ICD-10-CM | POA: Diagnosis not present

## 2017-08-29 DIAGNOSIS — R7301 Impaired fasting glucose: Secondary | ICD-10-CM

## 2017-08-29 DIAGNOSIS — R519 Headache, unspecified: Secondary | ICD-10-CM

## 2017-08-29 NOTE — Progress Notes (Signed)
Subjective:    Patient ID: [name], female    DOB: 1945-09-25, 72 y.o.   MRN: [id number]  HPI 72 yo female is here today to f/u  for recent urgent care visit on July 12 for right temporal headache.  Has a history of TMJ but says she really was not having any soreness over the joint or cracking or popping.  She does wear a mouthguard nightly.  The headache started to intensify she described it as a shooting electric shock type pain.  It was really pretty much on the right side of her head and over the temple.  She has had some vision issues but none that changed abruptly with the headaches.  She says within 24 to 48 hours after starting the prednisone she started to get some significant relief in the headache.  She has not had any fevers chills or sweats.  She has 1 more day of prednisone to complete today.  Impaired fasting glucose-she follows with Dr. Legrand Como [place], endocrinology.  Her last hemoglobin A1c was mildly elevated at 6.5.  She really does not want to go on metformin and wants to try to work on diet and exercise but she has not been able to make it to the gym because she is been helping take care of her grandchildren.  She is also noted to have hyperlipidemia and says that Dr. Elyse Hsu had actually recommended.  But she says she is very fearful to do so.  She says she has had 3 friends who have died from acute ALS and she thinks it is from being on a statin.  She is discussed those fears with her endocrinologist.   She also complains of pain at the bottom of her right foot near her heel going into the arch over the last 2 days.  She says it is worse if she puts a lot of pressure on it.  No known injury or trauma.  She did try soaking it in hot water with Epsom salts and says the hot water actually made it more painful.  She has not tried ice or anti-inflammatories.  Redness on.  Review of Systems  BP 129/66   Pulse 79   Ht 5\' 7"  (1.702 m)   Wt 184 lb (83.5 kg)   SpO2 97%    BMI 28.82 kg/m     Allergies  Allergen Reactions  . Astelin [Azelastine Hcl] Other (See Comments)    Headache  . Lisinopril     ACE cough  . Morphine     REACTION: Nausea  . Moxifloxacin     REACTION: confusion, dizziness, paranoia    Past Medical History:  Diagnosis Date  . Allergic rhinitis, cause unspecified   . Anxiety   . Benign paroxysmal positional vertigo   . Cancer ([place])    basal cell carcinoma per right side of nostril  . Depressive disorder, not elsewhere classified   . Diabetes mellitus without complication ([place])    noted per H&P with Dr Dianah Field 08/03/2014   . Esophageal reflux   . History of hiatal hernia   . Hypertension   . Localized osteoarthrosis not specified whether primary or secondary, lower leg   . Other and unspecified hyperlipidemia   . Other diseases of vocal cords   . Peptic ulcer, unspecified site, unspecified as acute or chronic, without mention of hemorrhage, perforation, or obstruction   . Pneumonia    bilat pneumonia 1987  . Shortness of breath dyspnea    with  anxiety; with climbing stairs  . TMJ (temporomandibular joint disorder)   . Unspecified arthropathy, hand   . Unspecified asthma(493.90)    triggered with a virus   . Unspecified hypothyroidism     Past Surgical History:  Procedure Laterality Date  . BREAST BIOPSY  09/07/2011   High Risk Lesion  . BREAST EXCISIONAL BIOPSY Left   . BREAST SURGERY     LUMPECTOMY / LEFT 10/12/2011  . KNEE ARTHROSCOPY     bilat   . NASAL SINUS SURGERY    . ROTATOR CUFF REPAIR     right   . thumb surgery      left hand 45 years ago   . TOTAL HIP ARTHROPLASTY Right 08/10/2014   Procedure: RIGHT TOTAL HIP ARTHROPLASTY ANTERIOR APPROACH;  Surgeon: Paralee Cancel, MD;  Location: [facility];  Service: Orthopedics;  Laterality: Right;    Social History   Socioeconomic History  . Marital status: Married    Spouse name: Not on file  . Number of children: 3  . Years of education: Not on file  .  Highest education level: Not on file  Occupational History  . Occupation: Retired  Scientific laboratory technician  . Financial resource strain: Not on file  . Food insecurity:    Worry: Not on file    Inability: Not on file  . Transportation needs:    Medical: Not on file    Non-medical: Not on file  Tobacco Use  . Smoking status: Former Smoker    Packs/day: 0.30    Years: 2.00    Pack years: 0.60    Types: Cigarettes    Last attempt to quit: 02/12/1958    Years since quitting: 59.5  . Smokeless tobacco: Never Used  Substance and Sexual Activity  . Alcohol use: Yes    Comment: wine socially  . Drug use: No  . Sexual activity: Yes  Lifestyle  . Physical activity:    Days per week: Not on file    Minutes per session: Not on file  . Stress: Not on file  Relationships  . Social connections:    Talks on phone: Not on file    Gets together: Not on file    Attends religious service: Not on file    Active member of club or organization: Not on file    Attends meetings of clubs or organizations: Not on file    Relationship status: Not on file  . Intimate partner violence:    Fear of current or ex partner: Not on file    Emotionally abused: Not on file    Physically abused: Not on file    Forced sexual activity: Not on file  Other Topics Concern  . Not on file  Social History Narrative  . Not on file    Family History  Problem Relation Age of Onset  . Heart disease Father   . Colon cancer Mother   . Liver cancer Brother   . Asthma Brother     Outpatient Encounter Medications as of 08/29/2017  Medication Sig  . albuterol (PROVENTIL HFA) 108 (90 Base) MCG/ACT inhaler Inhale 2 puffs into the lungs every 6 (six) hours as needed for shortness of breath.  . AMBULATORY NON FORMULARY MEDICATION APPLY 0 5GM TO AFFECTED PAINFUL AREAS UP TO 3 TIMES A DAY  0 5GM   1 PUMP C-AUTH SUB W/KETAMINE  . AMBULATORY NON FORMULARY MEDICATION APPLY 0.5GM TO AFFECTED PAINFUL AREAS UP TO 3 TIMES A DAY. 0.5GM = 1  PUMP C-AUTH SUB W/KETAMINE  . atorvastatin (LIPITOR) 10 MG tablet Take 10 mg by mouth daily.  . busPIRone (BUSPAR) 15 MG tablet Take 15 mg by mouth daily.  . Calcium Carbonate-Vit D-Min 600-400 MG-UNIT TABS Take 1 tablet by mouth 2 (two) times daily.   . clindamycin (CLEOCIN T) 1 % lotion Apply 1 application topically 2 (two) times daily as needed (bumps).  . Famotidine (PEPCID PO) Take by mouth every evening.   . fexofenadine (ALLEGRA) 180 MG tablet Take 180 mg by mouth at bedtime.  . fluticasone (FLONASE) 50 MCG/ACT nasal spray USE 2 SPRAYS IN EACH NOSTRIL EVERY DAY  . folic acid (FOLVITE) 1 MG tablet Take 1 mg by mouth 2 (two) times daily.    . hydrochlorothiazide (HYDRODIURIL) 25 MG tablet TAKE 1 TABLET BY MOUTH EVERY DAY  . levothyroxine (SYNTHROID, LEVOTHROID) 75 MCG tablet Take 75 mcg by mouth daily. Except Sunday 88 mcg  . levothyroxine (SYNTHROID, LEVOTHROID) 88 MCG tablet Take only sunday   . losartan (COZAAR) 50 MG tablet Take 1 tablet (50 mg total) by mouth daily.  . meloxicam (MOBIC) 15 MG tablet TAKE 1 TABLET BY MOUTH EVERY MORNING  . metroNIDAZOLE (METROGEL) 1 % gel APPLY EVERY DAY  . minoxidil (ROGAINE) 2 % external solution Apply 1 application topically 2 (two) times daily.  . ondansetron (ZOFRAN) 4 MG tablet Take 1 tablet (4 mg total) by mouth every 8 (eight) hours as needed for nausea or vomiting.  . pantoprazole (PROTONIX) 40 MG tablet Take 1 tablet (40 mg total) by mouth 2 (two) times daily. (Patient taking differently: Take 40 mg by mouth daily. )  . PARoxetine (PAXIL-CR) 12.5 MG 24 hr tablet Take 12.5 mg by mouth 2 (two) times daily.   . Saline (SIMPLY SALINE) 0.9 % AERS Place 1 spray into the nose 2 (two) times daily as needed (dry).  . SYMBICORT 80-4.5 MCG/ACT inhaler INHALE 2 PUFFS TWICE A DAY  . [DISCONTINUED] budesonide-formoterol (SYMBICORT) 80-4.5 MCG/ACT inhaler Inhale 2 puffs into the lungs 2 (two) times daily.  . [DISCONTINUED] buPROPion (WELLBUTRIN XL) 150 MG  24 hr tablet Take 150 mg by mouth daily.    . [DISCONTINUED] predniSONE (DELTASONE) 20 MG tablet Take one tab by mouth twice daily for 5 days, then one daily for 3 days. Take with food.   No facility-administered encounter medications on file as of 08/29/2017.        Objective:   Physical Exam  Constitutional: She is oriented to person, place, and time. She appears well-developed and well-nourished.  HENT:  Head: Normocephalic and atraumatic.  Cardiovascular: Normal rate, regular rhythm and normal heart sounds.  Pulmonary/Chest: Effort normal and breath sounds normal.  Musculoskeletal:  Tender over the bottom of the heel towards the arch.  Some discomfort with dorsiflexion.  Ankle with no increased laxity and normal range of motion.  Strength is 5 out of 5 in all directions.  Toe strength is normal as well.  She does have some bluish discoloration of the distal foot and it does feel cool to touch.  Neurological: She is alert and oriented to person, place, and time.  Skin: Skin is warm and dry.  Psychiatric: She has a normal mood and affect. Her behavior is normal.       Assessment & Plan:  Right sided temporal HA -sed rate and CRP were completely normal also makes it less likely to be temporal arteritis though she did have a pretty brisk response to the prednisone.  Encouraged  her to complete her last dose today and if the headache starts to return please let us know immediately.  Or she starts to notice any significant vision changes to let us know immediately.  Right plantar fascitis -discussed diagnosis.  Right now most consistent with plantar fasciitis especially without any fall or trauma.  She does walk around the house barefoot so we discussed wearing more supportive shoe wear and may be even trying to get some heel cups.  Gave her handout on some stretches to do on her own and recommend icing instead of heat.  If she is not improving over the next 3 to 4 weeks and please let me  know.  Acute stress -we discussed strategies around giving herself more time to make sure that she is taking care of herself, exercising regularly and getting adequate sleep.  That may mean saying no to her family sometimes especially when it comes to taking care of her grandchildren but it is important that she also takes care of her own health.  IFG - she is working on diet and exercise to get back under control before starting medication.  Though they have discussed the possibility of starting metformin.

## 2017-09-04 DIAGNOSIS — F902 Attention-deficit hyperactivity disorder, combined type: Secondary | ICD-10-CM | POA: Diagnosis not present

## 2017-09-04 DIAGNOSIS — F410 Panic disorder [episodic paroxysmal anxiety] without agoraphobia: Secondary | ICD-10-CM | POA: Diagnosis not present

## 2017-09-04 DIAGNOSIS — F422 Mixed obsessional thoughts and acts: Secondary | ICD-10-CM | POA: Diagnosis not present

## 2017-09-12 DIAGNOSIS — H43813 Vitreous degeneration, bilateral: Secondary | ICD-10-CM | POA: Diagnosis not present

## 2017-09-12 DIAGNOSIS — H25813 Combined forms of age-related cataract, bilateral: Secondary | ICD-10-CM | POA: Diagnosis not present

## 2017-09-12 DIAGNOSIS — H1789 Other corneal scars and opacities: Secondary | ICD-10-CM | POA: Diagnosis not present

## 2017-09-12 DIAGNOSIS — H5213 Myopia, bilateral: Secondary | ICD-10-CM | POA: Diagnosis not present

## 2017-09-16 ENCOUNTER — Encounter: Payer: Self-pay | Admitting: Pulmonary Disease

## 2017-09-16 ENCOUNTER — Ambulatory Visit (INDEPENDENT_AMBULATORY_CARE_PROVIDER_SITE_OTHER): Payer: Medicare Other | Admitting: Pulmonary Disease

## 2017-09-16 DIAGNOSIS — J301 Allergic rhinitis due to pollen: Secondary | ICD-10-CM | POA: Diagnosis not present

## 2017-09-16 DIAGNOSIS — J4530 Mild persistent asthma, uncomplicated: Secondary | ICD-10-CM

## 2017-09-16 NOTE — Progress Notes (Signed)
   Subjective:    Patient ID: [name], female    DOB: September 28, 1945, 72 y.o.   MRN: [id number]  HPI  72yo female for FU mild persistent asthma, allergic rhinitis and chronic cough with vocal cord dysfunction  She was on inhaled Pulmicort and after visit 10/2016 for flare  we stepped up to Symbicort She had 2 cats at home for many years, her grandkids were allergic and she had to finally get her symptoms have dramatically improved since then.  She is hardly needed rescue therapy. She continues to use her Symbicort 2 puffs once daily. She is compliant with her Allegra but only uses Flonase on an as-needed basis. Her symptoms typically flareup during spring and fall  She denies significant sinus drip or reflux   Significant tests/ events reviewed  Spirometry 06/2013 ratio 63 FEV1 normal.   Review of Systems Patient denies significant dyspnea,cough, hemoptysis,  chest pain, palpitations, pedal edema, orthopnea, paroxysmal nocturnal dyspnea, lightheadedness, nausea, vomiting, abdominal or  leg pains      Objective:   Physical Exam  Gen. Pleasant, well-nourished, in no distress ENT - no thrush, no post nasal drip Neck: No JVD, no thyromegaly, no carotid bruits Lungs: no use of accessory muscles, no dullness to percussion, clear without rales or rhonchi  Cardiovascular: Rhythm regular, heart sounds  normal, no murmurs or gallops, no peripheral edema Musculoskeletal: No deformities, no cyanosis or clubbing        Assessment & Plan:

## 2017-09-16 NOTE — Assessment & Plan Note (Signed)
Ct allegra

## 2017-09-16 NOTE — Assessment & Plan Note (Signed)
Stay on Symbicort 2 puffs daily. During fall, take your Flonase daily. Stay on [place]. If she stays well will consider stepdown therapy on your next visit

## 2017-09-16 NOTE — Patient Instructions (Signed)
Stay on Symbicort 2 puffs daily. During fall, take your Flonase daily. Stay on [place]. If you stay well will consider stepdown therapy on your next visit

## 2017-09-17 DIAGNOSIS — F902 Attention-deficit hyperactivity disorder, combined type: Secondary | ICD-10-CM | POA: Diagnosis not present

## 2017-09-17 DIAGNOSIS — F422 Mixed obsessional thoughts and acts: Secondary | ICD-10-CM | POA: Diagnosis not present

## 2017-09-17 DIAGNOSIS — F410 Panic disorder [episodic paroxysmal anxiety] without agoraphobia: Secondary | ICD-10-CM | POA: Diagnosis not present

## 2017-09-23 ENCOUNTER — Other Ambulatory Visit: Payer: Self-pay | Admitting: Family Medicine

## 2017-09-24 DIAGNOSIS — F902 Attention-deficit hyperactivity disorder, combined type: Secondary | ICD-10-CM | POA: Diagnosis not present

## 2017-09-24 DIAGNOSIS — F410 Panic disorder [episodic paroxysmal anxiety] without agoraphobia: Secondary | ICD-10-CM | POA: Diagnosis not present

## 2017-09-24 DIAGNOSIS — F422 Mixed obsessional thoughts and acts: Secondary | ICD-10-CM | POA: Diagnosis not present

## 2017-09-25 NOTE — Progress Notes (Signed)
HPI The patient presents for evaluation of chest pain.  I saw her last in 2017.  In the past she has had dyspnea and chest pain.  However, echocardiogram was unremarkable in 2015. There is no evidence of ischemia on stress perfusion study in 2017.   I note from review of the records that she had a CT last year for PE and had no evidence of this. She presents for worsening DOE. States she is fatigued and short of breath more easily, especially after walking up the stairs or going on long walks, she does have to take multiple breaks. Also reports intermittent chest discomfort with her SOB. She does not describe any PND, orthopnea, or leg swelling. No dizziness, presyncope, or syncopal episodes. No heart palpitations.    Allergies  Allergen Reactions  . Astelin [Azelastine Hcl] Other (See Comments)    Headache  . Lisinopril     ACE cough  . Morphine     REACTION: Nausea  . Moxifloxacin     REACTION: confusion, dizziness, paranoia    Current Outpatient Medications  Medication Sig Dispense Refill  . albuterol (PROVENTIL HFA) 108 (90 Base) MCG/ACT inhaler Inhale 2 puffs into the lungs every 6 (six) hours as needed for shortness of breath. 6.7 g 5  . AMBULATORY NON FORMULARY MEDICATION APPLY 0 5GM TO AFFECTED PAINFUL AREAS UP TO 3 TIMES A DAY  0 5GM   1 PUMP C-AUTH SUB W/KETAMINE  3  . AMBULATORY NON FORMULARY MEDICATION APPLY 0.5GM TO AFFECTED PAINFUL AREAS UP TO 3 TIMES A DAY. 0.5GM = 1 PUMP C-AUTH SUB W/KETAMINE  3  . atorvastatin (LIPITOR) 10 MG tablet Take 10 mg by mouth daily.  3  . Calcium Carbonate-Vit D-Min 600-400 MG-UNIT TABS Take 1 tablet by mouth 2 (two) times daily.     . clindamycin (CLEOCIN T) 1 % lotion Apply 1 application topically 2 (two) times daily as needed (bumps). 60 mL 1  . Famotidine (PEPCID PO) Take by mouth as needed.     . fexofenadine (ALLEGRA) 180 MG tablet Take 180 mg by mouth at bedtime.    . fluticasone (FLONASE) 50 MCG/ACT nasal spray USE 2 SPRAYS IN EACH  NOSTRIL EVERY DAY 16 g 5  . folic acid (FOLVITE) 1 MG tablet Take 1 mg by mouth 2 (two) times daily.      . hydrochlorothiazide (HYDRODIURIL) 25 MG tablet TAKE 1 TABLET BY MOUTH EVERY DAY 90 tablet 2  . levothyroxine (SYNTHROID, LEVOTHROID) 75 MCG tablet Take 75 mcg by mouth daily. Except Sunday 88 mcg    . levothyroxine (SYNTHROID, LEVOTHROID) 88 MCG tablet Take only sunday     . losartan (COZAAR) 50 MG tablet Take 1 tablet (50 mg total) by mouth daily. 90 tablet 3  . meloxicam (MOBIC) 15 MG tablet TAKE 1 TABLET BY MOUTH EVERY MORNING 90 tablet 1  . metroNIDAZOLE (METROGEL) 1 % gel APPLY EVERY DAY (Patient taking differently: as needed. ) 60 g 1  . minoxidil (ROGAINE) 2 % external solution Apply 1 application topically 2 (two) times daily.    . ondansetron (ZOFRAN) 4 MG tablet Take 1 tablet (4 mg total) by mouth every 8 (eight) hours as needed for nausea or vomiting. 12 tablet 0  . pantoprazole (PROTONIX) 40 MG tablet Take 1 tablet (40 mg total) by mouth 2 (two) times daily. (Patient taking differently: Take 40 mg by mouth daily. ) 180 tablet 3  . PARoxetine (PAXIL-CR) 12.5 MG 24 hr tablet Take  12.5 mg by mouth 2 (two) times daily.     . Saline (SIMPLY SALINE) 0.9 % AERS Place 1 spray into the nose 2 (two) times daily as needed (dry).    . SYMBICORT 80-4.5 MCG/ACT inhaler INHALE 2 PUFFS TWICE A DAY 30.6 Inhaler 3   No current facility-administered medications for this visit.     Past Medical History:  Diagnosis Date  . Allergic rhinitis, cause unspecified   . Anxiety   . Benign paroxysmal positional vertigo   . Cancer ([place])    basal cell carcinoma per right side of nostril  . Depressive disorder, not elsewhere classified   . Diabetes mellitus without complication ([place])    noted per H&P with Dr Dianah Field 08/03/2014   . Esophageal reflux   . History of hiatal hernia   . Hypertension   . Localized osteoarthrosis not specified whether primary or secondary, lower leg   . Other and  unspecified hyperlipidemia   . Other diseases of vocal cords   . Peptic ulcer, unspecified site, unspecified as acute or chronic, without mention of hemorrhage, perforation, or obstruction   . Pneumonia    bilat pneumonia 1987  . Shortness of breath dyspnea    with anxiety; with climbing stairs  . TMJ (temporomandibular joint disorder)   . Unspecified arthropathy, hand   . Unspecified asthma(493.90)    triggered with a virus   . Unspecified hypothyroidism     Past Surgical History:  Procedure Laterality Date  . BREAST BIOPSY  09/07/2011   High Risk Lesion  . BREAST EXCISIONAL BIOPSY Left   . BREAST SURGERY     LUMPECTOMY / LEFT 10/12/2011  . KNEE ARTHROSCOPY     bilat   . NASAL SINUS SURGERY    . ROTATOR CUFF REPAIR     right   . thumb surgery      left hand 45 years ago   . TOTAL HIP ARTHROPLASTY Right 08/10/2014   Procedure: RIGHT TOTAL HIP ARTHROPLASTY ANTERIOR APPROACH;  Surgeon: Paralee Cancel, MD;  Location: [facility];  Service: Orthopedics;  Laterality: Right;    ROS:  As stated in the HPI and negative for all other systems.  PHYSICAL EXAM BP 134/81   Pulse 78   Ht 5\' 7"  (1.702 m)   Wt 186 lb 9.6 oz (84.6 kg)   BMI 29.23 kg/m  GENERAL:  Well appearing NECK:  No jugular venous distention, waveform within normal limits, carotid upstroke brisk and symmetric, no bruits, no thyromegaly LUNGS:  Clear to auscultation bilaterally CHEST:  Unremarkable HEART:  PMI not displaced or sustained,S1 and S2 within normal limits, no S3, no S4, no clicks, no rubs, no murmurs ABD:  Flat, positive bowel sounds normal in frequency in pitch, no bruits, no rebound, no guarding, no midline pulsatile mass, no hepatomegaly, no splenomegaly EXT:  2 plus pulses throughout, no edema, no cyanosis no clubbing   EKG:  Sinus rhythm, rate 78 , left axis deviation, left atrial enlargement, RSR prime V1 and V2, no change from previous.  09/26/2017   ASSESSMENT AND PLAN   CHEST PAIN:  She does have  cardiovascular risk factors and describing some atypical chest pain.  At this point I think the pretest probability of obstructive coronary disease is somewhat low. She needs stress testing and would like to do a POET (Plain Old Exercise Treadmill)  DYSPNEA:  Given her worsening DOE, though she had a clear lung exam and no leg swelling, I would like to further evaluation  with a BNP.   ANXIETY: She has a great stress and anxiety we discussed this. Discussed exercises with therapy for this.  AORTIC ATHEROSCLEROSIS:  This was noted on the CT angio performed in September 2018. No evidence or coronary calcifications.   DM:  Most recent A1C is 6.5%. She is following this with an endocrinologist and attempting lifestyle and diet modifications. No changes needed.   DYSLIPIDEMIA:  She has started Lipitor 10mg . No changes needed.

## 2017-09-26 ENCOUNTER — Ambulatory Visit (INDEPENDENT_AMBULATORY_CARE_PROVIDER_SITE_OTHER): Payer: Medicare Other | Admitting: Cardiology

## 2017-09-26 ENCOUNTER — Encounter: Payer: Self-pay | Admitting: Cardiology

## 2017-09-26 VITALS — BP 134/81 | HR 78 | Ht 67.0 in | Wt 186.6 lb

## 2017-09-26 DIAGNOSIS — R0602 Shortness of breath: Secondary | ICD-10-CM | POA: Diagnosis not present

## 2017-09-26 DIAGNOSIS — I700 Atherosclerosis of aorta: Secondary | ICD-10-CM

## 2017-09-26 DIAGNOSIS — R079 Chest pain, unspecified: Secondary | ICD-10-CM

## 2017-09-26 NOTE — Patient Instructions (Signed)
Medication Instructions:  Continue current medication  If you need a refill on your cardiac medications before your next appointment, please call your pharmacy.  Labwork: BNP Today HERE IN OUR OFFICE AT [facility]  Take the provided lab slips with you to the lab for your blood draw.   You will NOT need to fast   Testing/Procedures: Your physician has requested that you have an exercise tolerance test. For further information please visit [email or web address]. Please also follow instruction sheet, as given.   Follow-Up: Your physician wants you to follow-up in: As Needed.   .   Thank you for choosing [facility] at [facility]!!

## 2017-09-27 LAB — BRAIN NATRIURETIC PEPTIDE: BNP: 21 pg/mL (ref 0.0–100.0)

## 2017-09-30 NOTE — Addendum Note (Signed)
Addended by: Vennie Homans on: 09/30/2017 02:25 PM   Modules accepted: Orders

## 2017-10-03 ENCOUNTER — Telehealth (HOSPITAL_COMMUNITY): Payer: Self-pay

## 2017-10-03 NOTE — Telephone Encounter (Signed)
Encounter complete. 

## 2017-10-08 ENCOUNTER — Ambulatory Visit (HOSPITAL_COMMUNITY)
Admission: RE | Admit: 2017-10-08 | Discharge: 2017-10-08 | Disposition: A | Discharge status: Home / Self Care | Payer: Medicare Other | Source: Ambulatory Visit | Attending: Cardiology | Admitting: Cardiology

## 2017-10-08 DIAGNOSIS — R0602 Shortness of breath: Secondary | ICD-10-CM | POA: Diagnosis not present

## 2017-10-08 LAB — EXERCISE TOLERANCE TEST
CHL RATE OF PERCEIVED EXERTION: 18
CSEPEW: 6.6 METS
Exercise duration (min): 4 min
Exercise duration (sec): 41 s
MPHR: 147 {beats}/min
Peak HR: 127 {beats}/min
Percent HR: 86 %
Rest HR: 75 {beats}/min

## 2017-10-15 DIAGNOSIS — F410 Panic disorder [episodic paroxysmal anxiety] without agoraphobia: Secondary | ICD-10-CM | POA: Diagnosis not present

## 2017-10-15 DIAGNOSIS — F422 Mixed obsessional thoughts and acts: Secondary | ICD-10-CM | POA: Diagnosis not present

## 2017-10-15 DIAGNOSIS — F902 Attention-deficit hyperactivity disorder, combined type: Secondary | ICD-10-CM | POA: Diagnosis not present

## 2017-10-17 ENCOUNTER — Ambulatory Visit (INDEPENDENT_AMBULATORY_CARE_PROVIDER_SITE_OTHER): Payer: Medicare Other | Admitting: Family Medicine

## 2017-10-17 ENCOUNTER — Encounter: Payer: Self-pay | Admitting: Family Medicine

## 2017-10-17 VITALS — BP 127/66 | HR 89

## 2017-10-17 DIAGNOSIS — R51 Headache: Secondary | ICD-10-CM | POA: Diagnosis not present

## 2017-10-17 DIAGNOSIS — Z6829 Body mass index (BMI) 29.0-29.9, adult: Secondary | ICD-10-CM

## 2017-10-17 DIAGNOSIS — M542 Cervicalgia: Secondary | ICD-10-CM | POA: Diagnosis not present

## 2017-10-17 DIAGNOSIS — R519 Headache, unspecified: Secondary | ICD-10-CM

## 2017-10-17 NOTE — Progress Notes (Signed)
Subjective:    Patient ID: [name], female    DOB: 06-Mar-1945, 72 y.o.   MRN: [id number]  HPI  72 year old female comes in today to follow-up on right-sided headaches.  She still getting some headaches coming and going not nearly as persistent and as intense as it was previously.  Interestingly it did respond very quickly to prednisone.  She wonders if it could actually be coming from her neck because she has chronic neck pain.  In fact she sees a chiropractor every 2 to 3 weeks and has for years.  More recently he had recommend that she switch to a different type of pillow that actually has a raised border to support the neck.  She actually feels like that has been helpful but she still continues to have tenderness and soreness over the right side of her head just above her ear going towards the temple.  She does have a history of TMJ and wears a nightguard and has for years.  Been working really hard on her diet and trying to walk more for exercise.  She is noticed that she is gotten weak in her lower extremities and so has been trying to remedy that.  She goes to the gym about 3 days/week and has also been walking with a friend of hers.  She is actually lost 5 pounds on her scale at home.   Review of Systems  BP 127/66   Pulse 89   SpO2 97%     Allergies  Allergen Reactions  . Astelin [Azelastine Hcl] Other (See Comments)    Headache  . Lisinopril     ACE cough  . Morphine     REACTION: Nausea  . Moxifloxacin     REACTION: confusion, dizziness, paranoia    Past Medical History:  Diagnosis Date  . Allergic rhinitis, cause unspecified   . Anxiety   . Benign paroxysmal positional vertigo   . Cancer ([place])    basal cell carcinoma per right side of nostril  . Depressive disorder, not elsewhere classified   . Diabetes mellitus without complication ([place])    noted per H&P with Dr Dianah Field 08/03/2014   . Esophageal reflux   . History of hiatal hernia   . Hypertension    . Localized osteoarthrosis not specified whether primary or secondary, lower leg   . Other and unspecified hyperlipidemia   . Other diseases of vocal cords   . Peptic ulcer, unspecified site, unspecified as acute or chronic, without mention of hemorrhage, perforation, or obstruction   . Pneumonia    bilat pneumonia 1987  . Shortness of breath dyspnea    with anxiety; with climbing stairs  . TMJ (temporomandibular joint disorder)   . Unspecified arthropathy, hand   . Unspecified asthma(493.90)    triggered with a virus   . Unspecified hypothyroidism     Past Surgical History:  Procedure Laterality Date  . BREAST BIOPSY  09/07/2011   High Risk Lesion  . BREAST EXCISIONAL BIOPSY Left   . BREAST SURGERY     LUMPECTOMY / LEFT 10/12/2011  . KNEE ARTHROSCOPY     bilat   . NASAL SINUS SURGERY    . ROTATOR CUFF REPAIR     right   . thumb surgery      left hand 45 years ago   . TOTAL HIP ARTHROPLASTY Right 08/10/2014   Procedure: RIGHT TOTAL HIP ARTHROPLASTY ANTERIOR APPROACH;  Surgeon: Paralee Cancel, MD;  Location: [facility];  Service: Orthopedics;  Laterality: Right;    Social History   Socioeconomic History  . Marital status: Married    Spouse name: Not on file  . Number of children: 3  . Years of education: Not on file  . Highest education level: Not on file  Occupational History  . Occupation: Retired  Scientific laboratory technician  . Financial resource strain: Not on file  . Food insecurity:    Worry: Not on file    Inability: Not on file  . Transportation needs:    Medical: Not on file    Non-medical: Not on file  Tobacco Use  . Smoking status: Former Smoker    Packs/day: 0.30    Years: 2.00    Pack years: 0.60    Types: Cigarettes    Last attempt to quit: 02/12/1958    Years since quitting: 59.7  . Smokeless tobacco: Never Used  Substance and Sexual Activity  . Alcohol use: Yes    Comment: wine socially  . Drug use: No  . Sexual activity: Yes  Lifestyle  . Physical activity:     Days per week: Not on file    Minutes per session: Not on file  . Stress: Not on file  Relationships  . Social connections:    Talks on phone: Not on file    Gets together: Not on file    Attends religious service: Not on file    Active member of club or organization: Not on file    Attends meetings of clubs or organizations: Not on file    Relationship status: Not on file  . Intimate partner violence:    Fear of current or ex partner: Not on file    Emotionally abused: Not on file    Physically abused: Not on file    Forced sexual activity: Not on file  Other Topics Concern  . Not on file  Social History Narrative  . Not on file    Family History  Problem Relation Age of Onset  . Heart disease Father   . Colon cancer Mother   . Liver cancer Brother   . Asthma Brother     Outpatient Encounter Medications as of 10/17/2017  Medication Sig  . albuterol (PROVENTIL HFA) 108 (90 Base) MCG/ACT inhaler Inhale 2 puffs into the lungs every 6 (six) hours as needed for shortness of breath.  . AMBULATORY NON FORMULARY MEDICATION APPLY 0 5GM TO AFFECTED PAINFUL AREAS UP TO 3 TIMES A DAY  0 5GM   1 PUMP C-AUTH SUB W/KETAMINE  . AMBULATORY NON FORMULARY MEDICATION APPLY 0.5GM TO AFFECTED PAINFUL AREAS UP TO 3 TIMES A DAY. 0.5GM = 1 PUMP C-AUTH SUB W/KETAMINE  . atorvastatin (LIPITOR) 10 MG tablet Take 10 mg by mouth daily.  . Calcium Carbonate-Vit D-Min 600-400 MG-UNIT TABS Take 1 tablet by mouth 2 (two) times daily.   . clindamycin (CLEOCIN T) 1 % lotion Apply 1 application topically 2 (two) times daily as needed (bumps).  . famotidine (PEPCID) 10 MG tablet Take 10 mg by mouth at bedtime.  . fexofenadine (ALLEGRA) 180 MG tablet Take 180 mg by mouth at bedtime.  . fluticasone (FLONASE) 50 MCG/ACT nasal spray USE 2 SPRAYS IN EACH NOSTRIL EVERY DAY (Patient taking differently: Place 2 sprays into both nostrils as needed. )  . folic acid (FOLVITE) 1 MG tablet Take 1 mg by mouth 2 (two) times  daily.    . hydrochlorothiazide (HYDRODIURIL) 25 MG tablet TAKE 1 TABLET BY MOUTH EVERY DAY  .  levothyroxine (SYNTHROID, LEVOTHROID) 75 MCG tablet Take 75 mcg by mouth daily. Except Sunday 88 mcg  . levothyroxine (SYNTHROID, LEVOTHROID) 88 MCG tablet Take only sunday   . losartan (COZAAR) 50 MG tablet Take 1 tablet (50 mg total) by mouth daily.  . meloxicam (MOBIC) 15 MG tablet TAKE 1 TABLET BY MOUTH EVERY MORNING  . metroNIDAZOLE (METROGEL) 1 % gel APPLY EVERY DAY (Patient taking differently: as needed. )  . minoxidil (ROGAINE) 2 % external solution Apply 1 application topically 2 (two) times daily.  . ondansetron (ZOFRAN) 4 MG tablet Take 1 tablet (4 mg total) by mouth every 8 (eight) hours as needed for nausea or vomiting.  . pantoprazole (PROTONIX) 40 MG tablet Take 1 tablet by mouth daily.  Marland Kitchen PARoxetine (PAXIL-CR) 12.5 MG 24 hr tablet Take 12.5 mg by mouth 2 (two) times daily.   . Saline (SIMPLY SALINE) 0.9 % AERS Place 1 spray into the nose 2 (two) times daily as needed (dry).  . SYMBICORT 80-4.5 MCG/ACT inhaler INHALE 2 PUFFS TWICE A DAY (Patient taking differently: 1 puff. )  . [DISCONTINUED] Famotidine (PEPCID PO) Take by mouth as needed.   . [DISCONTINUED] pantoprazole (PROTONIX) 40 MG tablet Take 1 tablet (40 mg total) by mouth 2 (two) times daily. (Patient taking differently: Take 40 mg by mouth daily. )   No facility-administered encounter medications on file as of 10/17/2017.          Objective:   Physical Exam  Constitutional: She is oriented to person, place, and time. She appears well-developed and well-nourished.  HENT:  Head: Normocephalic and atraumatic.  Cardiovascular: Normal rate, regular rhythm and normal heart sounds.  Pulmonary/Chest: Effort normal and breath sounds normal.  Musculoskeletal:  Most cervical flexion, decreased extension.  She also has decreased rotation right and left but it is fairly symmetric and decreased side bending right and left but again  fairly symmetric.  Mildly tender over the lower lumbar spine.  Neurological: She is alert and oriented to person, place, and time.  Skin: Skin is warm and dry.  Tenderness over the scalp on right side of her head just above her ear.  No skin lesions.  She is not really tender over the temple itself.  And nontender over the TMJ joint.  Psychiatric: She has a normal mood and affect. Her behavior is normal.        Assessment & Plan:  Right sided headaches/Cervical strain - could be certainly coming from her cervical spine being that she is having persistent pain and problems.  I do think she would benefit from physical therapy in addition to her chiropractic care and she is open to that.  We will place new referral.  In the meantime continue to use her new pillow which does seem to help some.  And work on gentle stretches.  If her headaches continue as her neck improves and please let me know.  She does have some significant decrease in range of motion likely due to arthritis in her cervical spine.  BMI 29-Weight loss-congratulated her on her recent dietary changes and increase in walking and activity levels.  I think this is absolutely fantastic and encouraged her to continue to do so.

## 2017-10-19 ENCOUNTER — Other Ambulatory Visit: Payer: Self-pay | Admitting: Family Medicine

## 2017-10-22 ENCOUNTER — Encounter: Payer: Self-pay | Admitting: Rehabilitative and Restorative Service Providers"

## 2017-10-22 ENCOUNTER — Ambulatory Visit (INDEPENDENT_AMBULATORY_CARE_PROVIDER_SITE_OTHER): Payer: Medicare Other | Admitting: Rehabilitative and Restorative Service Providers"

## 2017-10-22 DIAGNOSIS — G44229 Chronic tension-type headache, not intractable: Secondary | ICD-10-CM | POA: Diagnosis not present

## 2017-10-22 DIAGNOSIS — F410 Panic disorder [episodic paroxysmal anxiety] without agoraphobia: Secondary | ICD-10-CM | POA: Diagnosis not present

## 2017-10-22 DIAGNOSIS — R29898 Other symptoms and signs involving the musculoskeletal system: Secondary | ICD-10-CM | POA: Diagnosis not present

## 2017-10-22 DIAGNOSIS — F902 Attention-deficit hyperactivity disorder, combined type: Secondary | ICD-10-CM | POA: Diagnosis not present

## 2017-10-22 DIAGNOSIS — M542 Cervicalgia: Secondary | ICD-10-CM

## 2017-10-22 DIAGNOSIS — R293 Abnormal posture: Secondary | ICD-10-CM

## 2017-10-22 DIAGNOSIS — F422 Mixed obsessional thoughts and acts: Secondary | ICD-10-CM | POA: Diagnosis not present

## 2017-10-22 NOTE — Therapy (Addendum)
[facility] [place], [place], [address] Phone: [phone]   Fax:  [phone]  Physical Therapy Evaluation  Patient Details  Name: [name] MRN: [id number] Date of Birth: 1945/04/03 Referring Provider: Dr Madilyn Fireman   Encounter Date: 10/22/2017  PT End of Session - 10/22/17 1237    Visit Number  1    Number of Visits  12    Date for PT Re-Evaluation  12/03/17    PT Start Time  1147    PT Stop Time  1248    PT Time Calculation (min)  61 min    Activity Tolerance  Patient tolerated treatment well       Past Medical History:  Diagnosis Date  . Allergic rhinitis, cause unspecified   . Anxiety   . Benign paroxysmal positional vertigo   . Cancer ([place])    basal cell carcinoma per right side of nostril  . Depressive disorder, not elsewhere classified   . Diabetes mellitus without complication ([place])    noted per H&P with Dr Dianah Field 08/03/2014   . Esophageal reflux   . History of hiatal hernia   . Hypertension   . Localized osteoarthrosis not specified whether primary or secondary, lower leg   . Other and unspecified hyperlipidemia   . Other diseases of vocal cords   . Peptic ulcer, unspecified site, unspecified as acute or chronic, without mention of hemorrhage, perforation, or obstruction   . Pneumonia    bilat pneumonia 1987  . Shortness of breath dyspnea    with anxiety; with climbing stairs  . TMJ (temporomandibular joint disorder)   . Unspecified arthropathy, hand   . Unspecified asthma(493.90)    triggered with a virus   . Unspecified hypothyroidism     Past Surgical History:  Procedure Laterality Date  . BREAST BIOPSY  09/07/2011   High Risk Lesion  . BREAST EXCISIONAL BIOPSY Left   . BREAST SURGERY     LUMPECTOMY / LEFT 10/12/2011  . KNEE ARTHROSCOPY     bilat   . NASAL SINUS SURGERY    . ROTATOR CUFF REPAIR     right   . thumb surgery      left hand 45 years ago   .  TOTAL HIP ARTHROPLASTY Right 08/10/2014   Procedure: RIGHT TOTAL HIP ARTHROPLASTY ANTERIOR APPROACH;  Surgeon: Paralee Cancel, MD;  Location: [facility];  Service: Orthopedics;  Laterality: Right;    There were no vitals filed for this visit.   Subjective Assessment - 10/22/17 1149    Subjective  Patient reports that she has had neck pain and HA on Rt side since 9/18 when she changed the bed she sleeps on. She has had pain and problems over the past 10 years. She is seen by chiropractor for neck problems over the past 10 years.     Pertinent History  Neck pain and LBP for the past 10+ years; borderline AODM; HTN; Rt THA 3 yrs ago; Rt RCR 11 yrs ago; arthroscopic surgery bilat knees 1991/1997    Diagnostic tests  xrays     Patient Stated Goals  get rid of some of the neck pain and headache     Currently in Pain?  Yes    Pain Score  4     Pain Location  Neck    Pain Orientation  Right;Left    Pain Descriptors / Indicators  Dull;Tightness    Pain Type  Chronic pain    Pain Radiating Towards  into the Rt side of head; upper back     Pain Onset  More than a month ago    Pain Frequency  Intermittent    Aggravating Factors   tight first thing in the morning; rushing; being on the computer; reading; turning head side to side; sitting with head straight ahead     Pain Relieving Factors  rest; new pillow; moist heat          [facility] PT Assessment - 10/22/17 0001      Assessment   Medical Diagnosis  Rt sided HA    Referring Provider  Dr Madilyn Fireman    Onset Date/Surgical Date  10/13/16   worse in the past 7-8 months    Hand Dominance  Right    Next MD Visit  none scheduled     Prior Therapy  chiropractic care x 10 years - every 2 weeks       Precautions   Precautions  None      Balance Screen   Has the patient fallen in the past 6 months  No    Has the patient had a decrease in activity level because of a fear of falling?   No    Is the patient reluctant to leave their home because of a fear of  falling?   No      Prior Function   Level of Independence  Independent    Vocation  Retired    Equities trader; CNA - retired ~ 15 yrs ago     Leisure  household chores; grandchildren who live close helps with care       Observation/Other Assessments   Focus on Therapeutic Outcomes (FOTO)   53% limitation       Sensation   Additional Comments  WFL's per pt report       Posture/Postural Control   Posture Comments  head forward; shoulders rounded and elevated; increased thoracic kyphosis; head of the humerus anterior orientation       AROM   AROM Assessment Site  --   end range tightness bilat shoulder elevation   Cervical Flexion  41    Cervical Extension  45    Cervical - Right Side Bend  34    Cervical - Left Side Bend  25    Cervical - Right Rotation  60    Cervical - Left Rotation  60      Strength   Overall Strength Comments  WFL's bilat UE's       Palpation   Spinal mobility  hypomobile through the thoracic and cervical apine with CPA and lateral glides     Palpation comment  significant muscular tightness Rt > Lt ant/lat/posterior cervical musculature; pecs; upper trap; leveator; teres         treatment consisted of ther ex - see HEP  Concluded with moist heat and e-stim through the cervical and thoracic spine area x 20 min        Objective measurements completed on examination: See above findings.              PT Education - 10/22/17 1219    Education Details  HEP TENS DN     Person(s) Educated  Patient    Methods  Explanation;Demonstration;Tactile cues;Verbal cues;Handout    Comprehension  Verbalized understanding;Returned demonstration;Verbal cues required;Tactile cues required          PT Long Term Goals - 10/22/17 1253      PT LONG TERM  GOAL #1   Title  Improve posture and alignment with patient to demonstrate improved upright posture with posterior shoulder girdle engaged 12/03/17    Time  6    Period  Weeks     Status  New      PT LONG TERM GOAL #2   Title  Increased cervical ROM by 5-10 degrees in all planes 12/03/17    Time  6    Period  Weeks    Status  New      PT LONG TERM GOAL #3   Title  Decrease frequency, intensity and duration of cervical pain and Rt sided headache by 50-75% allowing patient to perfrom normal functional activities with less limitation/restriction 12/03/17    Time  6    Period  Weeks    Status  New      PT LONG TERM GOAL #4   Title  Independent in HEP 12/03/17    Time  6    Period  Weeks    Status  New      PT LONG TERM GOAL #5   Title  Improve FOTO to </= 45% limitation 12/03/17    Time  6    Period  Weeks    Status  New             Plan - 10/22/17 1239    Clinical Impression Statement  [name] presents with chronic cervical dysfunction and Rt sided headache with cervical symptoms treated with chiropractic care for the past 10 years. Symptoms have increased significantly in the past year which patient feels is related to a new bed. She has poor cervical and thoracic posture and alignment; limited cervical and shoulder ROM/mobility; significant muscular tightness through the cervical and thoracic areas; pain limiting functional activities. Patient will benefit from PT to address problems identified.     History and Personal Factors relevant to plan of care:  chronic cervical and lumbar dysfunction/pain; Rt RCR; Rt THA     Clinical Presentation due to:  abnormal posture; sedentary lifestyle     Clinical Decision Making  Low    Rehab Potential  Good    PT Frequency  2x / week    PT Duration  6 weeks    PT Treatment/Interventions  Patient/family education;ADLs/Self Care Home Management;Cryotherapy;Electrical Stimulation;Iontophoresis 4mg /ml Dexamethasone;Moist Heat;Dry needling;Manual techniques;Neuromuscular re-education;Therapeutic activities;Therapeutic exercise    PT Next Visit Plan  review HEP; progress with manual work vs DN for cervical and thoracic  musculature; postural correction; modalities as indicated     Consulted and Agree with Plan of Care  Patient       Patient will benefit from skilled therapeutic intervention in order to improve the following deficits and impairments:  Postural dysfunction, Improper body mechanics, Pain, Increased fascial restricitons, Increased muscle spasms, Decreased mobility, Decreased range of motion, Decreased activity tolerance  Visit Diagnosis: Chronic tension-type headache, not intractable - Plan: PT plan of care cert/re-cert  Cervicalgia - Plan: PT plan of care cert/re-cert  Other symptoms and signs involving the musculoskeletal system - Plan: PT plan of care cert/re-cert  Abnormal posture - Plan: PT plan of care cert/re-cert     Problem List Patient Active Problem List   Diagnosis Date Noted  . Aortic atherosclerosis ([place]) 09/26/2017  . Fatty liver 11/09/2016  . Vitamin D deficiency 06/19/2016  . Chronic fatigue 06/19/2016  . Alopecia 06/19/2016  . Tendon nodule 01/16/2016  . Venous stasis ulcer of left lower extremity ([place]) 03/03/2015  . S/P right THA, AA  08/10/2014  . Primary osteoarthritis of right hip 05/27/2014  . Essential hypertension, benign 05/26/2014  . IFG (impaired fasting glucose) 01/25/2014  . Dyspnea 06/26/2013  . Vitreous detachment 09/23/2012  . Oral herpes 06/23/2012  . OCD (obsessive compulsive disorder) 05/09/2012  . Atypical ductal hyperplasia of breast 10/29/2011  . Abnormal mammogram with microcalcification 10/04/2011  . Obesity (BMI 30-39.9) 05/03/2011  . CARRIER/SUSPECTED CARRIER GROUP B STREPTOCOCCUS 11/17/2010  . Abnormal liver enzymes 10/25/2010  . SOMNOLENCE 11/25/2009  . Rheumatoid arthritis with rheumatoid factor ([place]) 11/30/2008  . POSTMENOPAUSAL STATUS 11/30/2008  . ARTHRITIS, HANDS, BILATERAL 12/22/2007  . LOC OSTEOARTHROS NOT SPEC PRIM/SEC LOWER LEG 03/19/2007  . Allergic rhinitis 11/05/2006  . VOCAL CORD DISORDER 11/05/2006  . Asthma with  allergic rhinitis 11/05/2006  . Hypothyroidism 09/24/2006  . DEPRESSION 09/24/2006  . VERTIGO, BENIGN PAROXYSMAL POSITION 09/24/2006  . GERD 09/24/2006  . HYPERLIPIDEMIA 09/23/2006    Cortney Beissel Nilda Simmer PT, MPH  10/22/2017, 12:58 PM  [facility] [place], [place], [address] Phone: [phone]   Fax:  [phone]  Name: Esme Freund MRN: [id number] Date of Birth: 1946/01/09

## 2017-10-22 NOTE — Patient Instructions (Signed)
Axial Extension (Chin Tuck)    Pull chin in and lengthen back of neck. Hold __5__ seconds while counting out loud. Repeat __10__ times. Do __several__ sessions per day.  Shoulder Blade Squeeze (using swim noodle)    Rotate shoulders back, then squeeze shoulder blades down and back . Hold 10 sec Repeat _10___ times. Do __several__ sessions per day.  Upper Back Strength: Lower Trapezius / Rotator Cuff " L's "     Arms in waitress pose, palms up. Press hands back and slide shoulder blades down. Hold for __5__ seconds. Repeat _10___ times. 1-2 times per day.    Scapular Retraction: Elbow Flexion (Standing)  "W's"     With elbows bent to 90, pinch shoulder blades together and rotate arms out, keeping elbows bent. Repeat __10__ times per set. Do __1-2__ sets per session. Do _several ___ sessions per day.  Scapula Adduction With Pectoralis Stretch: Low - Standing   Shoulders at 45 hands even with shoulders, keeping weight through legs, shift weight forward until you feel pull or stretch through the front of your chest. Hold _30__ seconds. Do _3__ times, _2-4__ times per day.   Scapula Adduction With Pectoralis Stretch: Mid-Range - Standing   Shoulders at 90 elbows even with shoulders, keeping weight through legs, shift weight forward until you feel pull or strength through the front of your chest. Hold __30_ seconds. Do _3__ times, __2-4_ times per day.   Scapula Adduction With Pectoralis Stretch: High - Standing   Shoulders at 120 hands up high on the doorway, keeping weight on feet, shift weight forward until you feel pull or stretch through the front of your chest. Hold _30__ seconds. Do _3__ times, _2-3__ times per day.   TENS UNIT: This is helpful for muscle pain and spasm.   Search and Purchase a TENS 7000 2nd edition at www.tenspros.com. It should be less than $30.     TENS unit instructions: Do not shower or bathe with the unit on Turn the unit off  before removing electrodes or batteries If the electrodes lose stickiness add a drop of water to the electrodes after they are disconnected from the unit and place on plastic sheet. If you continued to have difficulty, call the TENS unit company to purchase more electrodes. Do not apply lotion on the skin area prior to use. Make sure the skin is clean and dry as this will help prolong the life of the electrodes. After use, always check skin for unusual red areas, rash or other skin difficulties. If there are any skin problems, does not apply electrodes to the same area. Never remove the electrodes from the unit by pulling the wires. Do not use the TENS unit or electrodes other than as directed. Do not change electrode placement without consultating your therapist or physician. Keep 2 fingers with between each electrode.   Trigger Point Dry Needling  . What is Trigger Point Dry Needling (DN)? o DN is a physical therapy technique used to treat muscle pain and dysfunction. Specifically, DN helps deactivate muscle trigger points (muscle knots).  o A thin filiform needle is used to penetrate the skin and stimulate the underlying trigger point. The goal is for a local twitch response (LTR) to occur and for the trigger point to relax. No medication of any kind is injected during the procedure.   . What Does Trigger Point Dry Needling Feel Like?  o The procedure feels different for each individual patient. Some patients report that they do not  actually feel the needle enter the skin and overall the process is not painful. Very mild bleeding may occur. However, many patients feel a deep cramping in the muscle in which the needle was inserted. This is the local twitch response.   Marland Kitchen How Will I feel after the treatment? o Soreness is normal, and the onset of soreness may not occur for a few hours. Typically this soreness does not last longer than two days.  o Bruising is uncommon, however; ice can be used  to decrease any possible bruising.  o In rare cases feeling tired or nauseous after the treatment is normal. In addition, your symptoms may get worse before they get better, this period will typically not last longer than 24 hours.   . What Can I do After My Treatment? o Increase your hydration by drinking more water for the next 24 hours. o You may place ice or heat on the areas treated that have become sore, however, do not use heat on inflamed or bruised areas. Heat often brings more relief post needling. o You can continue your regular activities, but vigorous activity is not recommended initially after the treatment for 24 hours. o DN is best combined with other physical therapy such as strengthening, stretching, and other therapies.      [facility] [place], [place] [address]  [phone] (office) [phone] (fax)

## 2017-10-24 ENCOUNTER — Ambulatory Visit: Payer: Medicare Other | Admitting: Podiatry

## 2017-10-25 ENCOUNTER — Encounter: Payer: Self-pay | Admitting: Rehabilitative and Restorative Service Providers"

## 2017-10-25 ENCOUNTER — Ambulatory Visit (INDEPENDENT_AMBULATORY_CARE_PROVIDER_SITE_OTHER): Payer: Medicare Other | Admitting: Rehabilitative and Restorative Service Providers"

## 2017-10-25 DIAGNOSIS — M542 Cervicalgia: Secondary | ICD-10-CM

## 2017-10-25 DIAGNOSIS — R293 Abnormal posture: Secondary | ICD-10-CM

## 2017-10-25 DIAGNOSIS — R29898 Other symptoms and signs involving the musculoskeletal system: Secondary | ICD-10-CM | POA: Diagnosis not present

## 2017-10-25 DIAGNOSIS — G44229 Chronic tension-type headache, not intractable: Secondary | ICD-10-CM | POA: Diagnosis not present

## 2017-10-25 NOTE — Therapy (Signed)
[place], [place], [address] Phone: [phone]   Fax:  [phone]  Physical Therapy Treatment  Patient Details  Name: [name] MRN: [id number] Date of Birth: 10-Oct-1945 Referring Provider: Dr Madilyn Fireman   Encounter Date: 10/25/2017  PT End of Session - 10/25/17 1504    Visit Number  2    Number of Visits  12    Date for PT Re-Evaluation  12/03/17    PT Start Time  1500    PT Stop Time  1602    PT Time Calculation (min)  62 min    Activity Tolerance  Patient tolerated treatment well       Past Medical History:  Diagnosis Date  . Allergic rhinitis, cause unspecified   . Anxiety   . Benign paroxysmal positional vertigo   . Cancer ([place])    basal cell carcinoma per right side of nostril  . Depressive disorder, not elsewhere classified   . Diabetes mellitus without complication ([place])    noted per H&P with Dr Dianah Field 08/03/2014   . Esophageal reflux   . History of hiatal hernia   . Hypertension   . Localized osteoarthrosis not specified whether primary or secondary, lower leg   . Other and unspecified hyperlipidemia   . Other diseases of vocal cords   . Peptic ulcer, unspecified site, unspecified as acute or chronic, without mention of hemorrhage, perforation, or obstruction   . Pneumonia    bilat pneumonia 1987  . Shortness of breath dyspnea    with anxiety; with climbing stairs  . TMJ (temporomandibular joint disorder)   . Unspecified arthropathy, hand   . Unspecified asthma(493.90)    triggered with a virus   . Unspecified hypothyroidism     Past Surgical History:  Procedure Laterality Date  . BREAST BIOPSY  09/07/2011   High Risk Lesion  . BREAST EXCISIONAL BIOPSY Left   . BREAST SURGERY     LUMPECTOMY / LEFT 10/12/2011  . KNEE ARTHROSCOPY     bilat   . NASAL SINUS SURGERY    . ROTATOR CUFF REPAIR     right   . thumb surgery      left hand 45 years ago   . TOTAL  HIP ARTHROPLASTY Right 08/10/2014   Procedure: RIGHT TOTAL HIP ARTHROPLASTY ANTERIOR APPROACH;  Surgeon: Paralee Cancel, MD;  Location: [facility];  Service: Orthopedics;  Laterality: Right;    There were no vitals filed for this visit.  Subjective Assessment - 10/25/17 1504    Subjective  Felt great the afternoon and evening following the first treatment. She had a headache yesteerday and last night. She has some allergies and took some medication today which has helped. Has her microwave pack for the neck which she says helps. She has her TENS unit but has not used it yet.     Currently in Pain?  Yes    Pain Score  2     Pain Location  Neck    Pain Orientation  Right;Left    Pain Descriptors / Indicators  Dull;Tightness                       [facility] Adult PT Treatment/Exercise - 10/25/17 0001      Shoulder Exercises: Standing   Other Standing Exercises  axial extension 10 sec x 5; scap squeeze 10 sec x 10 with swim noodle     Other Standing Exercises  L's x 10;  W's x 10  with noodle       Shoulder Exercises: Stretch   Other Shoulder Stretches  3 way doorway stretch 30 sec x 3 reps each position - verbal cues for correct technique       Moist Heat Therapy   Number Minutes Moist Heat  20 Minutes    Moist Heat Location  Cervical   thoracic      Electrical Stimulation   Electrical Stimulation Location  bilat cervical and thoracic     Electrical Stimulation Action  TENS    Electrical Stimulation Parameters  to tolerance    Electrical Stimulation Goals  Pain;Tone      Manual Therapy   Manual therapy comments  pt prone and supine     Joint Mobilization  CPA mobs upper thoracic and cervical spine     Soft tissue mobilization  deep tissue work through the posterior lateral cervcical and posterior thoracic musculatrue bilat     Myofascial Release  posterior cervical to thoracic paraspinals into the scapulae        Trigger Point Dry Needling - 10/25/17 1528    Consent Given?   Yes    Education Handout Provided  Yes    Muscles Treated Upper Body  Upper trapezius;Oblique capitus;Suboccipitals muscle group;Longissimus   bilat    Upper Trapezius Response  Palpable increased muscle length    SubOccipitals Response  Palpable increased muscle length    Longissimus Response  Palpable increased muscle length           PT Education - 10/25/17 1554    Education Details  reviewed HEP; pt instrncted in application, use, care of TENS unit and practiced in clinic     Person(s) Educated  Patient    Methods  Explanation;Demonstration;Tactile cues;Verbal cues    Comprehension  Verbalized understanding;Returned demonstration;Verbal cues required;Tactile cues required          PT Long Term Goals - 10/22/17 1253      PT LONG TERM GOAL #1   Title  Improve posture and alignment with patient to demonstrate improved upright posture with posterior shoulder girdle engaged 12/03/17    Time  6    Period  Weeks    Status  New      PT LONG TERM GOAL #2   Title  Increased cervical ROM by 5-10 degrees in all planes 12/03/17    Time  6    Period  Weeks    Status  New      PT LONG TERM GOAL #3   Title  Decrease frequency, intensity and duration of cervical pain and Rt sided headache by 50-75% allowing patient to perfrom normal functional activities with less limitation/restriction 12/03/17    Time  6    Period  Weeks    Status  New      PT LONG TERM GOAL #4   Title  Independent in HEP 12/03/17    Time  6    Period  Weeks    Status  New      PT LONG TERM GOAL #5   Title  Improve FOTO to </= 45% limitation 12/03/17    Time  6    Period  Weeks    Status  New            Plan - 10/25/17 1507    Clinical Impression Statement  Good response to initial treatment. Requires assistance for correct techniques for HEP. Reviewed HEP. No new exercises today - unable to  repeat current program without verbal and tactile cues. Tolerated trial of DN. Continues tho have  muscular tightness through the cervical and thoracic musculature.     Rehab Potential  Good    PT Frequency  2x / week    PT Duration  6 weeks    PT Treatment/Interventions  Patient/family education;ADLs/Self Care Home Management;Cryotherapy;Electrical Stimulation;Iontophoresis 4mg /ml Dexamethasone;Moist Heat;Dry needling;Manual techniques;Neuromuscular re-education;Therapeutic activities;Therapeutic exercise    PT Next Visit Plan  review HEP; assess response to manual work and DN for cervical and thoracic musculature; postural correction; modalities as indicated     Consulted and Agree with Plan of Care  Patient       Patient will benefit from skilled therapeutic intervention in order to improve the following deficits and impairments:  Postural dysfunction, Improper body mechanics, Pain, Increased fascial restricitons, Increased muscle spasms, Decreased mobility, Decreased range of motion, Decreased activity tolerance  Visit Diagnosis: Chronic tension-type headache, not intractable  Cervicalgia  Other symptoms and signs involving the musculoskeletal system  Abnormal posture     Problem List Patient Active Problem List   Diagnosis Date Noted  . Aortic atherosclerosis ([place]) 09/26/2017  . Fatty liver 11/09/2016  . Vitamin D deficiency 06/19/2016  . Chronic fatigue 06/19/2016  . Alopecia 06/19/2016  . Tendon nodule 01/16/2016  . Venous stasis ulcer of left lower extremity ([place]) 03/03/2015  . S/P right THA, AA 08/10/2014  . Primary osteoarthritis of right hip 05/27/2014  . Essential hypertension, benign 05/26/2014  . IFG (impaired fasting glucose) 01/25/2014  . Dyspnea 06/26/2013  . Vitreous detachment 09/23/2012  . Oral herpes 06/23/2012  . OCD (obsessive compulsive disorder) 05/09/2012  . Atypical ductal hyperplasia of breast 10/29/2011  . Abnormal mammogram with microcalcification 10/04/2011  . Obesity (BMI 30-39.9) 05/03/2011  . CARRIER/SUSPECTED CARRIER GROUP B  STREPTOCOCCUS 11/17/2010  . Abnormal liver enzymes 10/25/2010  . SOMNOLENCE 11/25/2009  . Rheumatoid arthritis with rheumatoid factor ([place]) 11/30/2008  . POSTMENOPAUSAL STATUS 11/30/2008  . ARTHRITIS, HANDS, BILATERAL 12/22/2007  . LOC OSTEOARTHROS NOT SPEC PRIM/SEC LOWER LEG 03/19/2007  . Allergic rhinitis 11/05/2006  . VOCAL CORD DISORDER 11/05/2006  . Asthma with allergic rhinitis 11/05/2006  . Hypothyroidism 09/24/2006  . DEPRESSION 09/24/2006  . VERTIGO, BENIGN PAROXYSMAL POSITION 09/24/2006  . GERD 09/24/2006  . HYPERLIPIDEMIA 09/23/2006    Jemma Rasp Nilda Simmer PT, MPH  10/25/2017, 3:57 PM  [facility] [place], [place], [address] Phone: [phone]   Fax:  [phone]  Name: Kieara Schwark MRN: [id number] Date of Birth: [birth year]

## 2017-10-28 ENCOUNTER — Encounter: Payer: Medicare Other | Admitting: Rehabilitative and Restorative Service Providers"

## 2017-10-31 ENCOUNTER — Ambulatory Visit (INDEPENDENT_AMBULATORY_CARE_PROVIDER_SITE_OTHER): Payer: Medicare Other | Admitting: Rehabilitative and Restorative Service Providers"

## 2017-10-31 ENCOUNTER — Encounter: Payer: Self-pay | Admitting: Rehabilitative and Restorative Service Providers"

## 2017-10-31 DIAGNOSIS — M542 Cervicalgia: Secondary | ICD-10-CM

## 2017-10-31 DIAGNOSIS — G44229 Chronic tension-type headache, not intractable: Secondary | ICD-10-CM

## 2017-10-31 DIAGNOSIS — R29898 Other symptoms and signs involving the musculoskeletal system: Secondary | ICD-10-CM | POA: Diagnosis not present

## 2017-10-31 DIAGNOSIS — R293 Abnormal posture: Secondary | ICD-10-CM | POA: Diagnosis not present

## 2017-10-31 NOTE — Therapy (Signed)
[place], [place], [address] Phone: [phone]   Fax:  [phone]  Physical Therapy Treatment  Patient Details  Name: [name] MRN: [id number] Date of Birth: February 27, 1945 Referring Provider: Dr Madilyn Fireman   Encounter Date: 10/31/2017  PT End of Session - 10/31/17 1457    Visit Number  3    Number of Visits  12    Date for PT Re-Evaluation  12/03/17    PT Start Time  0737    PT Stop Time  1555    PT Time Calculation (min)  59 min    Activity Tolerance  Patient tolerated treatment well       Past Medical History:  Diagnosis Date  . Allergic rhinitis, cause unspecified   . Anxiety   . Benign paroxysmal positional vertigo   . Cancer ([place])    basal cell carcinoma per right side of nostril  . Depressive disorder, not elsewhere classified   . Diabetes mellitus without complication ([place])    noted per H&P with Dr Dianah Field 08/03/2014   . Esophageal reflux   . History of hiatal hernia   . Hypertension   . Localized osteoarthrosis not specified whether primary or secondary, lower leg   . Other and unspecified hyperlipidemia   . Other diseases of vocal cords   . Peptic ulcer, unspecified site, unspecified as acute or chronic, without mention of hemorrhage, perforation, or obstruction   . Pneumonia    bilat pneumonia 1987  . Shortness of breath dyspnea    with anxiety; with climbing stairs  . TMJ (temporomandibular joint disorder)   . Unspecified arthropathy, hand   . Unspecified asthma(493.90)    triggered with a virus   . Unspecified hypothyroidism     Past Surgical History:  Procedure Laterality Date  . BREAST BIOPSY  09/07/2011   High Risk Lesion  . BREAST EXCISIONAL BIOPSY Left   . BREAST SURGERY     LUMPECTOMY / LEFT 10/12/2011  . KNEE ARTHROSCOPY     bilat   . NASAL SINUS SURGERY    . ROTATOR CUFF REPAIR     right   . thumb surgery      left hand 45 years ago   . TOTAL  HIP ARTHROPLASTY Right 08/10/2014   Procedure: RIGHT TOTAL HIP ARTHROPLASTY ANTERIOR APPROACH;  Surgeon: Paralee Cancel, MD;  Location: [facility];  Service: Orthopedics;  Laterality: Right;    There were no vitals filed for this visit.  Subjective Assessment - 10/31/17 1458    Subjective  Patient reports that she has generally felt better. She has had increased stress in the past week and has noticed some increased tightness and pain since yesterday.     Currently in Pain?  Yes    Pain Score  2     Pain Location  Neck    Pain Orientation  Right;Left    Pain Descriptors / Indicators  Dull;Tightness    Pain Type  Chronic pain                       [facility] Adult PT Treatment/Exercise - 10/31/17 0001      Shoulder Exercises: Standing   Extension  Strengthening;Right;Left;20 reps;Theraband    Theraband Level (Shoulder Extension)  Level 2 (Red)    Row  Strengthening;Right;Left;20 reps;Theraband    Theraband Level (Shoulder Row)  Level 2 (Red)    Retraction  Strengthening;Right;Left;20 reps;Theraband    Theraband Level (Shoulder Retraction)  Level 1 (Yellow)    Other Standing Exercises  axial extension 10 sec x 5; scap squeeze 10 sec x 10 with swim noodle     Other Standing Exercises  L's x 10; W's x 10  with noodle       Shoulder Exercises: Stretch   Other Shoulder Stretches  3 way doorway stretch 30 sec x 3 reps each position - verbal cues for correct technique       Moist Heat Therapy   Number Minutes Moist Heat  20 Minutes    Moist Heat Location  Cervical   thoracic      Electrical Stimulation   Electrical Stimulation Location  bilat cervical and thoracic     Electrical Stimulation Action  IFC    Electrical Stimulation Parameters  to tolerance    Electrical Stimulation Goals  Pain;Tone      Manual Therapy   Manual therapy comments  pt prone     Joint Mobilization  CPA mobs upper thoracic and cervical spine     Soft tissue mobilization  deep tissue work through the  posterior lateral cervcical and posterior thoracic musculatrue bilat     Myofascial Release  posterior cervical to thoracic paraspinals into the scapulae       Neck Exercises: Stretches   Upper Trapezius Stretch  Right;Left;3 reps;10 seconds   with chin tucked    Neck Stretch  5 reps   5 sec hold seated    Other Neck Stretches  cervical rotation 2-3 sec x 3 reps - with chin tuck before rotation        Trigger Point Dry Needling - 10/31/17 1536    Consent Given?  Yes    Muscles Treated Upper Body  --   bilat    Upper Trapezius Response  Palpable increased muscle length    SubOccipitals Response  Palpable increased muscle length    Longissimus Response  Palpable increased muscle length           PT Education - 10/31/17 1514    Education Details  HEP     Person(s) Educated  Patient    Methods  Explanation;Demonstration;Tactile cues;Verbal cues;Handout    Comprehension  Verbalized understanding;Returned demonstration;Verbal cues required;Tactile cues required          PT Long Term Goals - 10/22/17 1253      PT LONG TERM GOAL #1   Title  Improve posture and alignment with patient to demonstrate improved upright posture with posterior shoulder girdle engaged 12/03/17    Time  6    Period  Weeks    Status  New      PT LONG TERM GOAL #2   Title  Increased cervical ROM by 5-10 degrees in all planes 12/03/17    Time  6    Period  Weeks    Status  New      PT LONG TERM GOAL #3   Title  Decrease frequency, intensity and duration of cervical pain and Rt sided headache by 50-75% allowing patient to perfrom normal functional activities with less limitation/restriction 12/03/17    Time  6    Period  Weeks    Status  New      PT LONG TERM GOAL #4   Title  Independent in HEP 12/03/17    Time  6    Period  Weeks    Status  New      PT LONG TERM GOAL #5   Title  Improve FOTO to </=  45% limitation 12/03/17    Time  6    Period  Weeks    Status  New             Plan - 10/31/17 1458    Clinical Impression Statement  Patient reports improvement with DN and manual work but continued increase in tightness and pain which she feels is related to stress. Good response to DN and manual work with patient demonstrating increased mobility with decresaed palpable tightnes noted. progressing well toward stated goals of therapy.     Rehab Potential  Good    PT Frequency  2x / week    PT Duration  6 weeks    PT Treatment/Interventions  Patient/family education;ADLs/Self Care Home Management;Cryotherapy;Electrical Stimulation;Iontophoresis 4mg /ml Dexamethasone;Moist Heat;Dry needling;Manual techniques;Neuromuscular re-education;Therapeutic activities;Therapeutic exercise    PT Next Visit Plan  review HEP; assess response to manual work and DN for cervical and thoracic musculature; postural correction; modalities as indicated     Consulted and Agree with Plan of Care  Patient       Patient will benefit from skilled therapeutic intervention in order to improve the following deficits and impairments:  Postural dysfunction, Improper body mechanics, Pain, Increased fascial restricitons, Increased muscle spasms, Decreased mobility, Decreased range of motion, Decreased activity tolerance  Visit Diagnosis: Chronic tension-type headache, not intractable  Cervicalgia  Other symptoms and signs involving the musculoskeletal system  Abnormal posture     Problem List Patient Active Problem List   Diagnosis Date Noted  . Aortic atherosclerosis ([place]) 09/26/2017  . Fatty liver 11/09/2016  . Vitamin D deficiency 06/19/2016  . Chronic fatigue 06/19/2016  . Alopecia 06/19/2016  . Tendon nodule 01/16/2016  . Venous stasis ulcer of left lower extremity ([place]) 03/03/2015  . S/P right THA, AA 08/10/2014  . Primary osteoarthritis of right hip 05/27/2014  . Essential hypertension, benign 05/26/2014  . IFG (impaired fasting glucose) 01/25/2014  . Dyspnea  06/26/2013  . Vitreous detachment 09/23/2012  . Oral herpes 06/23/2012  . OCD (obsessive compulsive disorder) 05/09/2012  . Atypical ductal hyperplasia of breast 10/29/2011  . Abnormal mammogram with microcalcification 10/04/2011  . Obesity (BMI 30-39.9) 05/03/2011  . CARRIER/SUSPECTED CARRIER GROUP B STREPTOCOCCUS 11/17/2010  . Abnormal liver enzymes 10/25/2010  . SOMNOLENCE 11/25/2009  . Rheumatoid arthritis with rheumatoid factor ([place]) 11/30/2008  . POSTMENOPAUSAL STATUS 11/30/2008  . ARTHRITIS, HANDS, BILATERAL 12/22/2007  . LOC OSTEOARTHROS NOT SPEC PRIM/SEC LOWER LEG 03/19/2007  . Allergic rhinitis 11/05/2006  . VOCAL CORD DISORDER 11/05/2006  . Asthma with allergic rhinitis 11/05/2006  . Hypothyroidism 09/24/2006  . DEPRESSION 09/24/2006  . VERTIGO, BENIGN PAROXYSMAL POSITION 09/24/2006  . GERD 09/24/2006  . HYPERLIPIDEMIA 09/23/2006    Jatinder Mcdonagh Nilda Simmer PT, MPH  10/31/2017, 3:39 PM  [facility] [place], [place], [address] Phone: [phone]   Fax:  [phone]  Name: [name] MRN: [id number] Date of Birth: 10-18-1945

## 2017-10-31 NOTE — Patient Instructions (Addendum)
Resisted External Rotation: in Neutral - Bilateral yellow band    PALMS UP Sit or stand, tubing in both hands, elbows at sides, bent to 90, forearms forward. Pinch shoulder blades together and rotate forearms out. Keep elbows at sides. Repeat __10__ times per set. Do _2-3___ sets per session. Do _2-3___ sessions per day.   Low Row: Standing   Face anchor, feet shoulder width apart. Palms up, pull arms back, squeezing shoulder blades down and back. Repeat 10__ times per set. Do 2-3__ sets per session. Do 2-3__ sessions per week. Anchor Height: Waist   Strengthening: Resisted Extension   Hold tubing in right hand, arm forward. Pull arm back, elbow straight. Repeat _10___ times per set. Do 2-3____ sets per session. Do 2-3____ sessions per day.   Flexors, Sitting / Standing    Stand or sit, head in comfortable, centered position. Draw chin in, pulling head straight back, keeping jaw and eyes level. Hold __5-10_ seconds. Repeat _5__ times per session. Do _2-3__ sessions per day.  Side Bend, Sitting    Sit, head in comfortable, centered position, chin slightly tucked. Gently tilt head, bringing ear toward same-side shoulder. Hold _2-3__ seconds.  Repeat __2-3_ times per session. Do _2-3__ sessions per day.   AROM, Rotation    Sit or stand, head comfortable, centered position. Turn head slowly to look over one shoulder. Hold _2-3__ seconds. Repeat to other side. Repeat _3-5__ times per session. Do _2-3__ sessions per day.

## 2017-11-04 ENCOUNTER — Ambulatory Visit (INDEPENDENT_AMBULATORY_CARE_PROVIDER_SITE_OTHER): Payer: Medicare Other | Admitting: Rehabilitative and Restorative Service Providers"

## 2017-11-04 ENCOUNTER — Encounter: Payer: Self-pay | Admitting: Rehabilitative and Restorative Service Providers"

## 2017-11-04 DIAGNOSIS — M542 Cervicalgia: Secondary | ICD-10-CM

## 2017-11-04 DIAGNOSIS — R29898 Other symptoms and signs involving the musculoskeletal system: Secondary | ICD-10-CM

## 2017-11-04 DIAGNOSIS — G44229 Chronic tension-type headache, not intractable: Secondary | ICD-10-CM | POA: Diagnosis not present

## 2017-11-04 DIAGNOSIS — R293 Abnormal posture: Secondary | ICD-10-CM | POA: Diagnosis not present

## 2017-11-04 NOTE — Therapy (Signed)
[facility] [place], [place], [address] Phone: [phone]   Fax:  [phone]  Physical Therapy Treatment  Patient Details  Name: [name] MRN: [id number] Date of Birth: [birth year] Referring Provider: Dr Madilyn Fireman   Encounter Date: 11/04/2017  PT End of Session - 11/04/17 1407    Visit Number  4    Number of Visits  12    Date for PT Re-Evaluation  12/03/17    PT Start Time  2297    PT Stop Time  1506    PT Time Calculation (min)  59 min    Activity Tolerance  Patient tolerated treatment well       Past Medical History:  Diagnosis Date  . Allergic rhinitis, cause unspecified   . Anxiety   . Benign paroxysmal positional vertigo   . Cancer ([place])    basal cell carcinoma per right side of nostril  . Depressive disorder, not elsewhere classified   . Diabetes mellitus without complication ([facility])    noted per H&P with Dr Dianah Field 08/03/2014   . Esophageal reflux   . History of hiatal hernia   . Hypertension   . Localized osteoarthrosis not specified whether primary or secondary, lower leg   . Other and unspecified hyperlipidemia   . Other diseases of vocal cords   . Peptic ulcer, unspecified site, unspecified as acute or chronic, without mention of hemorrhage, perforation, or obstruction   . Pneumonia    bilat pneumonia 1987  . Shortness of breath dyspnea    with anxiety; with climbing stairs  . TMJ (temporomandibular joint disorder)   . Unspecified arthropathy, hand   . Unspecified asthma(493.90)    triggered with a virus   . Unspecified hypothyroidism     Past Surgical History:  Procedure Laterality Date  . BREAST BIOPSY  09/07/2011   High Risk Lesion  . BREAST EXCISIONAL BIOPSY Left   . BREAST SURGERY     LUMPECTOMY / LEFT 10/12/2011  . KNEE ARTHROSCOPY     bilat   . NASAL SINUS SURGERY    . ROTATOR CUFF REPAIR     right   . thumb surgery      left hand 45 years ago   . TOTAL  HIP ARTHROPLASTY Right 08/10/2014   Procedure: RIGHT TOTAL HIP ARTHROPLASTY ANTERIOR APPROACH;  Surgeon: Paralee Cancel, MD;  Location: [facility];  Service: Orthopedics;  Laterality: Right;    There were no vitals filed for this visit.  Subjective Assessment - 11/04/17 1408    Subjective  Patient reports that she feels she has a lot of stress which influences her headaches. She is having pain in the temple area. The headaches are generally better but she is having pain in the temples which is increased with stress.     Currently in Pain?  Yes    Pain Score  1     Pain Location  Neck    Pain Orientation  Right;Left    Pain Descriptors / Indicators  Dull;Tightness    Pain Radiating Towards  into the temple area - related to stress     Pain Onset  More than a month ago    Pain Frequency  Intermittent         [facility] PT Assessment - 11/04/17 0001      Assessment   Medical Diagnosis  Rt sided HA    Referring Provider  Dr Madilyn Fireman    Onset Date/Surgical Date  10/13/16  worse in the past 7-8 months    Hand Dominance  Right    Next MD Visit  none scheduled     Prior Therapy  chiropractic care x 10 years - every 2 weeks       Posture/Postural Control   Posture Comments  improving posture and alignment with less head forward; shoulders rounded and elevated; increased thoracic kyphosis; head of the humerus anterior orientation       Palpation   Palpation comment  decreased muscular tightness Rt > Lt ant/lat/posterior cervical musculature; pecs; upper trap; leveator; teres    note tenderness to palpation/pain bilat temporalis mm bilat                   [facility] Adult PT Treatment/Exercise - 11/04/17 0001      Shoulder Exercises: Standing   Extension  Strengthening;Right;Left;20 reps;Theraband    Theraband Level (Shoulder Extension)  Level 2 (Red)    Row  Strengthening;Right;Left;20 reps;Theraband    Theraband Level (Shoulder Row)  Level 2 (Red)    Retraction   Strengthening;Right;Left;20 reps;Theraband    Theraband Level (Shoulder Retraction)  Level 2 (Red)    Other Standing Exercises  axial extension 10 sec x 5; scap squeeze 10 sec x 10 with swim noodle     Other Standing Exercises  L's x 10; W's x 10  with noodle       Shoulder Exercises: ROM/Strengthening   UBE (Upper Arm Bike)  L2 x 4 min alt fwd/back       Shoulder Exercises: Stretch   Other Shoulder Stretches  3 way doorway stretch 30 sec x 3 reps each position - verbal cues for correct technique       Moist Heat Therapy   Number Minutes Moist Heat  20 Minutes    Moist Heat Location  Cervical   thoracic - temples bilat      Electrical Stimulation   Electrical Stimulation Location  bilat cervical and thoracic     Electrical Stimulation Action  IFC    Electrical Stimulation Parameters  to tolerance    Electrical Stimulation Goals  Pain;Tone      Manual Therapy   Manual therapy comments  pt prone and supine     Joint Mobilization  CPA mobs upper thoracic and cervical spine     Soft tissue mobilization  deep tissue work through the posterior lateral cervcical and posterior thoracic musculatrue bilat = also working through the tempsle and scalp area bilat     Myofascial Release  posterior cervical to thoracic paraspinals into the scapulae       Neck Exercises: Stretches   Upper Trapezius Stretch  Right;Left;3 reps;10 seconds   with chin tucked    Neck Stretch  5 reps   5 sec hold seated    Other Neck Stretches  cervical rotation 2-3 sec x 3 reps - with chin tuck before rotation        Trigger Point Dry Needling - 11/04/17 1453    Consent Given?  Yes    Muscles Treated Upper Body  --   bilat temporalis    Upper Trapezius Response  Palpable increased muscle length    SubOccipitals Response  Palpable increased muscle length    Longissimus Response  Palpable increased muscle length                PT Long Term Goals - 10/22/17 1253      PT LONG TERM GOAL #1   Title  Improve posture and alignment with patient to demonstrate improved upright posture with posterior shoulder girdle engaged 12/03/17    Time  6    Period  Weeks    Status  New      PT LONG TERM GOAL #2   Title  Increased cervical ROM by 5-10 degrees in all planes 12/03/17    Time  6    Period  Weeks    Status  New      PT LONG TERM GOAL #3   Title  Decrease frequency, intensity and duration of cervical pain and Rt sided headache by 50-75% allowing patient to perfrom normal functional activities with less limitation/restriction 12/03/17    Time  6    Period  Weeks    Status  New      PT LONG TERM GOAL #4   Title  Independent in HEP 12/03/17    Time  6    Period  Weeks    Status  New      PT LONG TERM GOAL #5   Title  Improve FOTO to </= 45% limitation 12/03/17    Time  6    Period  Weeks    Status  New            Plan - 11/04/17 1408    Clinical Impression Statement  Headache continues to improve but patient has pain in the temples - symptoms increase with increased stress. Trial of DN to temporalis followed by manual work decreased temporal pain. Patient will assess response. She will continue with PT when she returns from her retreat.     Rehab Potential  Good    PT Frequency  2x / week    PT Duration  6 weeks    PT Treatment/Interventions  Patient/family education;ADLs/Self Care Home Management;Cryotherapy;Electrical Stimulation;Iontophoresis 4mg /ml Dexamethasone;Moist Heat;Dry needling;Manual techniques;Neuromuscular re-education;Therapeutic activities;Therapeutic exercise    PT Next Visit Plan  review HEP; assess response to manual work and DN for cervical and thoracic musculature; postural correction; modalities as indicated     Consulted and Agree with Plan of Care  Patient       Patient will benefit from skilled therapeutic intervention in order to improve the following deficits and impairments:  Postural dysfunction, Improper body mechanics, Pain, Increased  fascial restricitons, Increased muscle spasms, Decreased mobility, Decreased range of motion, Decreased activity tolerance  Visit Diagnosis: Chronic tension-type headache, not intractable  Cervicalgia  Other symptoms and signs involving the musculoskeletal system  Abnormal posture     Problem List Patient Active Problem List   Diagnosis Date Noted  . Aortic atherosclerosis ([facility]) 09/26/2017  . Fatty liver 11/09/2016  . Vitamin D deficiency 06/19/2016  . Chronic fatigue 06/19/2016  . Alopecia 06/19/2016  . Tendon nodule 01/16/2016  . Venous stasis ulcer of left lower extremity ([facility]) 03/03/2015  . S/P right THA, AA 08/10/2014  . Primary osteoarthritis of right hip 05/27/2014  . Essential hypertension, benign 05/26/2014  . IFG (impaired fasting glucose) 01/25/2014  . Dyspnea 06/26/2013  . Vitreous detachment 09/23/2012  . Oral herpes 06/23/2012  . OCD (obsessive compulsive disorder) 05/09/2012  . Atypical ductal hyperplasia of breast 10/29/2011  . Abnormal mammogram with microcalcification 10/04/2011  . Obesity (BMI 30-39.9) 05/03/2011  . CARRIER/SUSPECTED CARRIER GROUP B STREPTOCOCCUS 11/17/2010  . Abnormal liver enzymes 10/25/2010  . SOMNOLENCE 11/25/2009  . Rheumatoid arthritis with rheumatoid factor ([facility]) 11/30/2008  . POSTMENOPAUSAL STATUS 11/30/2008  . ARTHRITIS, HANDS, BILATERAL 12/22/2007  . LOC OSTEOARTHROS NOT SPEC PRIM/SEC LOWER  LEG 03/19/2007  . Allergic rhinitis 11/05/2006  . VOCAL CORD DISORDER 11/05/2006  . Asthma with allergic rhinitis 11/05/2006  . Hypothyroidism 09/24/2006  . DEPRESSION 09/24/2006  . VERTIGO, BENIGN PAROXYSMAL POSITION 09/24/2006  . GERD 09/24/2006  . HYPERLIPIDEMIA 09/23/2006    Jany Buckwalter Nilda Simmer PT, MPH  11/04/2017, 5:26 PM  [facility] [place], [place], [address] Phone: [phone]   Fax:  [phone]  Name: [name] MRN: [id number] Date of Birth:  03-Aug-1945

## 2017-11-05 ENCOUNTER — Ambulatory Visit: Payer: Medicare Other | Admitting: Family Medicine

## 2017-11-12 DIAGNOSIS — F410 Panic disorder [episodic paroxysmal anxiety] without agoraphobia: Secondary | ICD-10-CM | POA: Diagnosis not present

## 2017-11-12 DIAGNOSIS — F422 Mixed obsessional thoughts and acts: Secondary | ICD-10-CM | POA: Diagnosis not present

## 2017-11-12 DIAGNOSIS — F902 Attention-deficit hyperactivity disorder, combined type: Secondary | ICD-10-CM | POA: Diagnosis not present

## 2017-11-13 ENCOUNTER — Ambulatory Visit (INDEPENDENT_AMBULATORY_CARE_PROVIDER_SITE_OTHER): Payer: Medicare Other | Admitting: Rehabilitative and Restorative Service Providers"

## 2017-11-13 ENCOUNTER — Encounter: Payer: Self-pay | Admitting: Rehabilitative and Restorative Service Providers"

## 2017-11-13 DIAGNOSIS — R293 Abnormal posture: Secondary | ICD-10-CM

## 2017-11-13 DIAGNOSIS — R29898 Other symptoms and signs involving the musculoskeletal system: Secondary | ICD-10-CM

## 2017-11-13 DIAGNOSIS — M542 Cervicalgia: Secondary | ICD-10-CM

## 2017-11-13 DIAGNOSIS — G44229 Chronic tension-type headache, not intractable: Secondary | ICD-10-CM

## 2017-11-13 NOTE — Therapy (Signed)
[place], [place], [address] Phone: [phone]   Fax:  [phone]  Physical Therapy Treatment  Patient Details  Name: [name] MRN: [id number] Date of Birth: [birth year] Referring Provider (PT): Dr Madilyn Fireman   Encounter Date: 11/13/2017  PT End of Session - 11/13/17 1111    Visit Number  5    Number of Visits  12    Date for PT Re-Evaluation  12/03/17    PT Start Time  1110    PT Stop Time  1200    PT Time Calculation (min)  50 min    Activity Tolerance  Patient tolerated treatment well       Past Medical History:  Diagnosis Date  . Allergic rhinitis, cause unspecified   . Anxiety   . Benign paroxysmal positional vertigo   . Cancer ([facility])    basal cell carcinoma per right side of nostril  . Depressive disorder, not elsewhere classified   . Diabetes mellitus without complication ([facility])    noted per H&P with Dr Dianah Field 08/03/2014   . Esophageal reflux   . History of hiatal hernia   . Hypertension   . Localized osteoarthrosis not specified whether primary or secondary, lower leg   . Other and unspecified hyperlipidemia   . Other diseases of vocal cords   . Peptic ulcer, unspecified site, unspecified as acute or chronic, without mention of hemorrhage, perforation, or obstruction   . Pneumonia    bilat pneumonia 1987  . Shortness of breath dyspnea    with anxiety; with climbing stairs  . TMJ (temporomandibular joint disorder)   . Unspecified arthropathy, hand   . Unspecified asthma(493.90)    triggered with a virus   . Unspecified hypothyroidism     Past Surgical History:  Procedure Laterality Date  . BREAST BIOPSY  09/07/2011   High Risk Lesion  . BREAST EXCISIONAL BIOPSY Left   . BREAST SURGERY     LUMPECTOMY / LEFT 10/12/2011  . KNEE ARTHROSCOPY     bilat   . NASAL SINUS SURGERY    . ROTATOR CUFF REPAIR     right   . thumb surgery      left hand 45 years ago   .  TOTAL HIP ARTHROPLASTY Right 08/10/2014   Procedure: RIGHT TOTAL HIP ARTHROPLASTY ANTERIOR APPROACH;  Surgeon: Paralee Cancel, MD;  Location: [facility];  Service: Orthopedics;  Laterality: Right;    There were no vitals filed for this visit.  Subjective Assessment - 11/13/17 1112    Subjective  patient reports that she did well following DN to temple. No HA while she was away. DN really seems to work. Did some of her exercises while away. Had some HA this morning. Took Tylenol and it is much better.     Currently in Pain?  No/denies    Pain Location  Head         [facility] PT Assessment - 11/13/17 0001      Assessment   Medical Diagnosis  Rt sided HA    Referring Provider (PT)  Dr Madilyn Fireman    Onset Date/Surgical Date  10/13/16   worse in the past 7-8 months    Hand Dominance  Right    Next MD Visit  none scheduled     Prior Therapy  chiropractic care x 10 years - every 2 weeks       AROM   AROM Assessment Site  --   end range tightness  bilat shoulders    Cervical Flexion  54    Cervical Extension  44    Cervical - Right Side Bend  33    Cervical - Left Side Bend  28    Cervical - Right Rotation  62    Cervical - Left Rotation  63      Palpation   Palpation comment  muscular tightness Rt > Lt ant/lat/posterior cervical musculature; pecs; upper trap; leveator; teres    note tenderness to palpation/pain bilat temporalis mm bilat                   [facility] Adult PT Treatment/Exercise - 11/13/17 0001      Shoulder Exercises: ROM/Strengthening   UBE (Upper Arm Bike)  L2 x 3 min alt fwd/back       Shoulder Exercises: Stretch   Other Shoulder Stretches  3 way doorway stretch 30 sec x 3 reps each position - verbal cues for correct technique       Moist Heat Therapy   Number Minutes Moist Heat  15 Minutes    Moist Heat Location  Cervical   thoracic - temples bilat      Electrical Stimulation   Electrical Stimulation Location  bilat cervical and thoracic     Electrical  Stimulation Action  IFC    Electrical Stimulation Parameters  to tolerance    Electrical Stimulation Goals  Pain;Tone      Manual Therapy   Manual therapy comments  pt prone and supine     Joint Mobilization  CPA mobs upper thoracic and cervical spine     Soft tissue mobilization  deep tissue work through the posterior lateral cervcical and posterior thoracic musculatrue bilat = also working through the tempsle and scalp area bilat     Myofascial Release  posterior cervical to thoracic paraspinals into the scapulae       Neck Exercises: Stretches   Upper Trapezius Stretch  Right;Left;3 reps;10 seconds   with chin tucked    Neck Stretch  3 reps   5 sec hold seated    Other Neck Stretches  cervical rotation 2-3 sec x 3 reps - with chin tuck before rotation        Trigger Point Dry Needling - 11/13/17 1149    Consent Given?  Yes    Muscles Treated Upper Body  --   bilat DN inc temporalis   Upper Trapezius Response  Palpable increased muscle length    SubOccipitals Response  Palpable increased muscle length    Longissimus Response  Palpable increased muscle length                PT Long Term Goals - 10/22/17 1253      PT LONG TERM GOAL #1   Title  Improve posture and alignment with patient to demonstrate improved upright posture with posterior shoulder girdle engaged 12/03/17    Time  6    Period  Weeks    Status  New      PT LONG TERM GOAL #2   Title  Increased cervical ROM by 5-10 degrees in all planes 12/03/17    Time  6    Period  Weeks    Status  New      PT LONG TERM GOAL #3   Title  Decrease frequency, intensity and duration of cervical pain and Rt sided headache by 50-75% allowing patient to perfrom normal functional activities with less limitation/restriction 12/03/17    Time  6    Period  Weeks    Status  New      PT LONG TERM GOAL #4   Title  Independent in HEP 12/03/17    Time  6    Period  Weeks    Status  New      PT LONG TERM GOAL #5   Title   Improve FOTO to </= 45% limitation 12/03/17    Time  6    Period  Weeks    Status  New            Plan - 11/13/17 1112    Clinical Impression Statement  No Headaches since last visit until this morning. Patient tolerated DN in bilat temporalis well with good improvement in HA. Patient continues to demonstrate increasing ROM and decreasing headaches/neck pain. Progressing gradually toward stated goals of therapy.     Rehab Potential  Good    PT Frequency  2x / week    PT Duration  6 weeks    PT Treatment/Interventions  Patient/family education;ADLs/Self Care Home Management;Cryotherapy;Electrical Stimulation;Iontophoresis 4mg /ml Dexamethasone;Moist Heat;Dry needling;Manual techniques;Neuromuscular re-education;Therapeutic activities;Therapeutic exercise    PT Next Visit Plan  HEP; continue manual work and DN for cervical and thoracic musculature; postural correction; modalities as indicated     Consulted and Agree with Plan of Care  Patient       Patient will benefit from skilled therapeutic intervention in order to improve the following deficits and impairments:  Postural dysfunction, Improper body mechanics, Pain, Increased fascial restricitons, Increased muscle spasms, Decreased mobility, Decreased range of motion, Decreased activity tolerance  Visit Diagnosis: Chronic tension-type headache, not intractable  Cervicalgia  Other symptoms and signs involving the musculoskeletal system  Abnormal posture     Problem List Patient Active Problem List   Diagnosis Date Noted  . Aortic atherosclerosis ([place]) 09/26/2017  . Fatty liver 11/09/2016  . Vitamin D deficiency 06/19/2016  . Chronic fatigue 06/19/2016  . Alopecia 06/19/2016  . Tendon nodule 01/16/2016  . Venous stasis ulcer of left lower extremity ([facility]) 03/03/2015  . S/P right THA, AA 08/10/2014  . Primary osteoarthritis of right hip 05/27/2014  . Essential hypertension, benign 05/26/2014  . IFG (impaired fasting  glucose) 01/25/2014  . Dyspnea 06/26/2013  . Vitreous detachment 09/23/2012  . Oral herpes 06/23/2012  . OCD (obsessive compulsive disorder) 05/09/2012  . Atypical ductal hyperplasia of breast 10/29/2011  . Abnormal mammogram with microcalcification 10/04/2011  . Obesity (BMI 30-39.9) 05/03/2011  . CARRIER/SUSPECTED CARRIER GROUP B STREPTOCOCCUS 11/17/2010  . Abnormal liver enzymes 10/25/2010  . SOMNOLENCE 11/25/2009  . Rheumatoid arthritis with rheumatoid factor ([facility]) 11/30/2008  . POSTMENOPAUSAL STATUS 11/30/2008  . ARTHRITIS, HANDS, BILATERAL 12/22/2007  . LOC OSTEOARTHROS NOT SPEC PRIM/SEC LOWER LEG 03/19/2007  . Allergic rhinitis 11/05/2006  . VOCAL CORD DISORDER 11/05/2006  . Asthma with allergic rhinitis 11/05/2006  . Hypothyroidism 09/24/2006  . DEPRESSION 09/24/2006  . VERTIGO, BENIGN PAROXYSMAL POSITION 09/24/2006  . GERD 09/24/2006  . HYPERLIPIDEMIA 09/23/2006    Markice Torbert Nilda Simmer PT, MPH  11/13/2017, 11:53 AM  [facility] [place], [place], [address] Phone: [phone]   Fax:  [phone]  Name: [name] MRN: [id number] Date of Birth: [birth year]

## 2017-11-14 ENCOUNTER — Encounter: Payer: Medicare Other | Admitting: Rehabilitative and Restorative Service Providers"

## 2017-11-19 ENCOUNTER — Ambulatory Visit (INDEPENDENT_AMBULATORY_CARE_PROVIDER_SITE_OTHER): Payer: Medicare Other | Admitting: Physical Therapy

## 2017-11-19 DIAGNOSIS — F902 Attention-deficit hyperactivity disorder, combined type: Secondary | ICD-10-CM | POA: Diagnosis not present

## 2017-11-19 DIAGNOSIS — G44229 Chronic tension-type headache, not intractable: Secondary | ICD-10-CM

## 2017-11-19 DIAGNOSIS — R293 Abnormal posture: Secondary | ICD-10-CM

## 2017-11-19 DIAGNOSIS — M542 Cervicalgia: Secondary | ICD-10-CM

## 2017-11-19 DIAGNOSIS — R29898 Other symptoms and signs involving the musculoskeletal system: Secondary | ICD-10-CM

## 2017-11-19 DIAGNOSIS — F410 Panic disorder [episodic paroxysmal anxiety] without agoraphobia: Secondary | ICD-10-CM | POA: Diagnosis not present

## 2017-11-19 DIAGNOSIS — F422 Mixed obsessional thoughts and acts: Secondary | ICD-10-CM | POA: Diagnosis not present

## 2017-11-19 NOTE — Therapy (Signed)
[place], [place], [address] Phone: [phone]   Fax:  [phone]  Physical Therapy Treatment  Patient Details  Name: [name] MRN: [id number] Date of Birth: Mar 14, 1945 Referring Provider (PT): Dr Madilyn Fireman   Encounter Date: 11/19/2017  PT End of Session - 11/19/17 1205    Visit Number  6    Number of Visits  12    Date for PT Re-Evaluation  12/03/17    PT Start Time  1204   pt arrived late   PT Stop Time  1251    PT Time Calculation (min)  47 min    Activity Tolerance  Patient tolerated treatment well    Behavior During Therapy  [facility] for tasks assessed/performed       Past Medical History:  Diagnosis Date  . Allergic rhinitis, cause unspecified   . Anxiety   . Benign paroxysmal positional vertigo   . Cancer ([place])    basal cell carcinoma per right side of nostril  . Depressive disorder, not elsewhere classified   . Diabetes mellitus without complication ([place])    noted per H&P with Dr Dianah Field 08/03/2014   . Esophageal reflux   . History of hiatal hernia   . Hypertension   . Localized osteoarthrosis not specified whether primary or secondary, lower leg   . Other and unspecified hyperlipidemia   . Other diseases of vocal cords   . Peptic ulcer, unspecified site, unspecified as acute or chronic, without mention of hemorrhage, perforation, or obstruction   . Pneumonia    bilat pneumonia 1987  . Shortness of breath dyspnea    with anxiety; with climbing stairs  . TMJ (temporomandibular joint disorder)   . Unspecified arthropathy, hand   . Unspecified asthma(493.90)    triggered with a virus   . Unspecified hypothyroidism     Past Surgical History:  Procedure Laterality Date  . BREAST BIOPSY  09/07/2011   High Risk Lesion  . BREAST EXCISIONAL BIOPSY Left   . BREAST SURGERY     LUMPECTOMY / LEFT 10/12/2011  . KNEE ARTHROSCOPY     bilat   . NASAL SINUS SURGERY    .  ROTATOR CUFF REPAIR     right   . thumb surgery      left hand 45 years ago   . TOTAL HIP ARTHROPLASTY Right 08/10/2014   Procedure: RIGHT TOTAL HIP ARTHROPLASTY ANTERIOR APPROACH;  Surgeon: Paralee Cancel, MD;  Location: [facility];  Service: Orthopedics;  Laterality: Right;    There were no vitals filed for this visit.  Subjective Assessment - 11/19/17 1205    Subjective  "I haven't had a headache since last week, which is a miracle".    Pt reports she has family in town and has been stressed.  she hasn't had much time for exercises.      Currently in Pain?  Yes    Pain Score  3     Pain Location  Neck    Pain Orientation  Right;Left    Pain Descriptors / Indicators  Aching;Dull    Aggravating Factors   turning head side/side    Pain Relieving Factors  relaxing, heat         [facility] PT Assessment - 11/19/17 0001      Assessment   Medical Diagnosis  Rt sided HA    Referring Provider (PT)  Dr Madilyn Fireman    Onset Date/Surgical Date  10/13/16   worse in the past  7-8 months    Hand Dominance  Right    Next MD Visit  PRN       [facility] Adult PT Treatment/Exercise - 11/19/17 0001      Shoulder Exercises: Standing   Extension  Strengthening;Right;Left;Theraband;10 reps    Theraband Level (Shoulder Extension)  Level 2 (Red)    Row  Strengthening;Right;Left;20 reps;Theraband    Theraband Level (Shoulder Row)  Level 2 (Red)    Retraction  Strengthening;Both;10 reps;Theraband    Theraband Level (Shoulder Retraction)  Level 2 (Red)      Shoulder Exercises: ROM/Strengthening   UBE (Upper Arm Bike)  L1: 1 min forward/1 min backward       Shoulder Exercises: Stretch   Other Shoulder Stretches  3 way doorway stretch 30 sec x 2 reps each position - verbal cues for correct technique       Moist Heat Therapy   Number Minutes Moist Heat  12 Minutes    Moist Heat Location  Cervical      Electrical Stimulation   Electrical Stimulation Location  bilat cervical paraspinals/ upper trap     Electrical Stimulation Action  IFC    Electrical Stimulation Parameters  to tolerance    Electrical Stimulation Goals  Pain;Tone      Manual Therapy   Soft tissue mobilization  STM to temporalis, masseter muscles, suboccipital muscles.     Myofascial Release  suboccipital release; MFR to Rt side of scalp to ear      Neck Exercises: Stretches   Upper Trapezius Stretch  Right;Left;1 rep;20 seconds    Other Neck Stretches  cervical rotation 2-3 sec x 3 reps - with chin tuck before rotation                   PT Long Term Goals - 10/22/17 1253      PT LONG TERM GOAL #1   Title  Improve posture and alignment with patient to demonstrate improved upright posture with posterior shoulder girdle engaged 12/03/17    Time  6    Period  Weeks    Status  New      PT LONG TERM GOAL #2   Title  Increased cervical ROM by 5-10 degrees in all planes 12/03/17    Time  6    Period  Weeks    Status  New      PT LONG TERM GOAL #3   Title  Decrease frequency, intensity and duration of cervical pain and Rt sided headache by 50-75% allowing patient to perfrom normal functional activities with less limitation/restriction 12/03/17    Time  6    Period  Weeks    Status  New      PT LONG TERM GOAL #4   Title  Independent in HEP 12/03/17    Time  6    Period  Weeks    Status  New      PT LONG TERM GOAL #5   Title  Improve FOTO to </= 45% limitation 12/03/17    Time  6    Period  Weeks    Status  New            Plan - 11/19/17 1212    Clinical Impression Statement  Pt reporting less frequent/ less intense headaches since intiating therapy. continued palpable tightness in fascia of Rt scalp, Rt jaw and suboccipital musculature; improved with manual therapy.  Pt tolerated all exercises without increase in symptoms.  Pt's session shortened due to  her late arrival.  Pt making good progress towards LTG#3   Rehab Potential  Good    PT Frequency  2x / week    PT Duration  6 weeks    PT  Treatment/Interventions  Patient/family education;ADLs/Self Care Home Management;Cryotherapy;Electrical Stimulation;Iontophoresis 4mg /ml Dexamethasone;Moist Heat;Dry needling;Manual techniques;Neuromuscular re-education;Therapeutic activities;Therapeutic exercise    PT Next Visit Plan  HEP; continue manual work and DN for cervical and thoracic musculature; postural correction; modalities as indicated. Measure neck ROM.      Consulted and Agree with Plan of Care  Patient       Patient will benefit from skilled therapeutic intervention in order to improve the following deficits and impairments:  Postural dysfunction, Improper body mechanics, Pain, Increased fascial restricitons, Increased muscle spasms, Decreased mobility, Decreased range of motion, Decreased activity tolerance  Visit Diagnosis: Chronic tension-type headache, not intractable  Cervicalgia  Other symptoms and signs involving the musculoskeletal system  Abnormal posture     Problem List Patient Active Problem List   Diagnosis Date Noted  . Aortic atherosclerosis ([place]) 09/26/2017  . Fatty liver 11/09/2016  . Vitamin D deficiency 06/19/2016  . Chronic fatigue 06/19/2016  . Alopecia 06/19/2016  . Tendon nodule 01/16/2016  . Venous stasis ulcer of left lower extremity ([place]) 03/03/2015  . S/P right THA, AA 08/10/2014  . Primary osteoarthritis of right hip 05/27/2014  . Essential hypertension, benign 05/26/2014  . IFG (impaired fasting glucose) 01/25/2014  . Dyspnea 06/26/2013  . Vitreous detachment 09/23/2012  . Oral herpes 06/23/2012  . OCD (obsessive compulsive disorder) 05/09/2012  . Atypical ductal hyperplasia of breast 10/29/2011  . Abnormal mammogram with microcalcification 10/04/2011  . Obesity (BMI 30-39.9) 05/03/2011  . CARRIER/SUSPECTED CARRIER GROUP B STREPTOCOCCUS 11/17/2010  . Abnormal liver enzymes 10/25/2010  . SOMNOLENCE 11/25/2009  . Rheumatoid arthritis with rheumatoid factor ([place]) 11/30/2008  .  POSTMENOPAUSAL STATUS 11/30/2008  . ARTHRITIS, HANDS, BILATERAL 12/22/2007  . LOC OSTEOARTHROS NOT SPEC PRIM/SEC LOWER LEG 03/19/2007  . Allergic rhinitis 11/05/2006  . VOCAL CORD DISORDER 11/05/2006  . Asthma with allergic rhinitis 11/05/2006  . Hypothyroidism 09/24/2006  . DEPRESSION 09/24/2006  . VERTIGO, BENIGN PAROXYSMAL POSITION 09/24/2006  . GERD 09/24/2006  . HYPERLIPIDEMIA 09/23/2006   Kerin Perna, PTA 11/19/17 12:44 PM  [place], [place], [address] Phone: [phone]   Fax:  [phone]  Name: [name] MRN: [id number] Date of Birth: January 11, 1946

## 2017-11-20 ENCOUNTER — Encounter: Payer: Medicare Other | Admitting: Physical Therapy

## 2017-11-25 ENCOUNTER — Encounter: Payer: Self-pay | Admitting: Rehabilitative and Restorative Service Providers"

## 2017-11-25 ENCOUNTER — Ambulatory Visit (INDEPENDENT_AMBULATORY_CARE_PROVIDER_SITE_OTHER): Payer: Medicare Other | Admitting: Rehabilitative and Restorative Service Providers"

## 2017-11-25 DIAGNOSIS — R293 Abnormal posture: Secondary | ICD-10-CM

## 2017-11-25 DIAGNOSIS — G44229 Chronic tension-type headache, not intractable: Secondary | ICD-10-CM

## 2017-11-25 DIAGNOSIS — M542 Cervicalgia: Secondary | ICD-10-CM | POA: Diagnosis not present

## 2017-11-25 DIAGNOSIS — R29898 Other symptoms and signs involving the musculoskeletal system: Secondary | ICD-10-CM | POA: Diagnosis not present

## 2017-11-25 NOTE — Therapy (Signed)
[place], [place], [address] Phone: [phone]   Fax:  [phone]  Physical Therapy Treatment  Patient Details  Name: [name] MRN: [id number] Date of Birth: 02-23-1945 Referring Provider (PT): Dr Madilyn Fireman   Encounter Date: 11/25/2017  PT End of Session - 11/25/17 1529    Visit Number  7    Number of Visits  12    Date for PT Re-Evaluation  12/03/17    PT Start Time  1527    PT Stop Time  1617    PT Time Calculation (min)  50 min    Activity Tolerance  Patient tolerated treatment well       Past Medical History:  Diagnosis Date  . Allergic rhinitis, cause unspecified   . Anxiety   . Benign paroxysmal positional vertigo   . Cancer ([place])    basal cell carcinoma per right side of nostril  . Depressive disorder, not elsewhere classified   . Diabetes mellitus without complication ([place])    noted per H&P with Dr Dianah Field 08/03/2014   . Esophageal reflux   . History of hiatal hernia   . Hypertension   . Localized osteoarthrosis not specified whether primary or secondary, lower leg   . Other and unspecified hyperlipidemia   . Other diseases of vocal cords   . Peptic ulcer, unspecified site, unspecified as acute or chronic, without mention of hemorrhage, perforation, or obstruction   . Pneumonia    bilat pneumonia 1987  . Shortness of breath dyspnea    with anxiety; with climbing stairs  . TMJ (temporomandibular joint disorder)   . Unspecified arthropathy, hand   . Unspecified asthma(493.90)    triggered with a virus   . Unspecified hypothyroidism     Past Surgical History:  Procedure Laterality Date  . BREAST BIOPSY  09/07/2011   High Risk Lesion  . BREAST EXCISIONAL BIOPSY Left   . BREAST SURGERY     LUMPECTOMY / LEFT 10/12/2011  . KNEE ARTHROSCOPY     bilat   . NASAL SINUS SURGERY    . ROTATOR CUFF REPAIR     right   . thumb surgery      left hand 45 years ago   .  TOTAL HIP ARTHROPLASTY Right 08/10/2014   Procedure: RIGHT TOTAL HIP ARTHROPLASTY ANTERIOR APPROACH;  Surgeon: Paralee Cancel, MD;  Location: [facility];  Service: Orthopedics;  Laterality: Right;    There were no vitals filed for this visit.  Subjective Assessment - 11/25/17 1530    Subjective  Patient reports that she has had some headaches but not so bad. She can tell the has the onset of a headache when she sits at the computer. Can correct posture and do her exercises and get rid of her headache.     Currently in Pain?  No/denies         [facility] PT Assessment - 11/25/17 0001      Assessment   Medical Diagnosis  Rt sided HA    Referring Provider (PT)  Dr Madilyn Fireman    Onset Date/Surgical Date  10/13/16   worse in the past 7-8 months    Hand Dominance  Right    Next MD Visit  PRN      AROM   Cervical Flexion  55    Cervical Extension  46    Cervical - Right Side Bend  33    Cervical - Left Side Bend  29  Cervical - Right Rotation  70    Cervical - Left Rotation  66      Palpation   Palpation comment  muscular tightness Rt > Lt ant/lat/posterior cervical musculature; pecs; upper trap; leveator; teres                    [facility] Adult PT Treatment/Exercise - 11/25/17 0001      Shoulder Exercises: ROM/Strengthening   UBE (Upper Arm Bike)  L2: 1.5 min forward/1.5 min backward       Shoulder Exercises: Stretch   Other Shoulder Stretches  3 way doorway stretch 30 sec x 2 reps each position - verbal cues for correct technique       Moist Heat Therapy   Number Minutes Moist Heat  20 Minutes    Moist Heat Location  Cervical      Electrical Stimulation   Electrical Stimulation Location  bilat cervical paraspinals/ upper trap    Electrical Stimulation Action  IFC    Electrical Stimulation Parameters  to toleranc    Electrical Stimulation Goals  Pain;Tone      Manual Therapy   Manual therapy comments  pt prone and supine     Soft tissue mobilization  deep tissue work  through bilat cervical and upper trap musculature as well sa temporalis       Neck Exercises: Stretches   Upper Trapezius Stretch  Right;Left;1 rep;20 seconds    Neck Stretch  --   axial extension 10 sec hold x3    Other Neck Stretches  cervical rotation 2-3 sec x 3 reps - with chin tuck before rotation        Trigger Point Dry Needling - 11/25/17 1557    Consent Given?  Yes    Muscles Treated Upper Body  --   bilat - temporalis    Upper Trapezius Response  Palpable increased muscle length    SubOccipitals Response  Palpable increased muscle length    Longissimus Response  Palpable increased muscle length                PT Long Term Goals - 10/22/17 1253      PT LONG TERM GOAL #1   Title  Improve posture and alignment with patient to demonstrate improved upright posture with posterior shoulder girdle engaged 12/03/17    Time  6    Period  Weeks    Status  New      PT LONG TERM GOAL #2   Title  Increased cervical ROM by 5-10 degrees in all planes 12/03/17    Time  6    Period  Weeks    Status  New      PT LONG TERM GOAL #3   Title  Decrease frequency, intensity and duration of cervical pain and Rt sided headache by 50-75% allowing patient to perfrom normal functional activities with less limitation/restriction 12/03/17    Time  6    Period  Weeks    Status  New      PT LONG TERM GOAL #4   Title  Independent in HEP 12/03/17    Time  6    Period  Weeks    Status  New      PT LONG TERM GOAL #5   Title  Improve FOTO to </= 45% limitation 12/03/17    Time  6    Period  Weeks    Status  New  Plan - 11/25/17 1529    Clinical Impression Statement  Patient reports that she now knows what causes headaches - will notice onset of headache when she is at the computer with her head forward. She can eliminate HA with postural changes and exercises. She continues to have palpable tightness through the cervical and jaw areas as well as into the temporalis  musculature; Rt > Lt. Responds well to treatment. needs to be consistent with HEP. Progressing gradaually toward stated goals of therapy.     Rehab Potential  Good    PT Frequency  2x / week    PT Duration  6 weeks    PT Treatment/Interventions  Patient/family education;ADLs/Self Care Home Management;Cryotherapy;Electrical Stimulation;Iontophoresis 4mg /ml Dexamethasone;Moist Heat;Dry needling;Manual techniques;Neuromuscular re-education;Therapeutic activities;Therapeutic exercise    PT Next Visit Plan  HEP; continue manual work and DN for cervical and thoracic musculature; postural correction; modalities as indicated.     Consulted and Agree with Plan of Care  Patient       Patient will benefit from skilled therapeutic intervention in order to improve the following deficits and impairments:  Postural dysfunction, Improper body mechanics, Pain, Increased fascial restricitons, Increased muscle spasms, Decreased mobility, Decreased range of motion, Decreased activity tolerance  Visit Diagnosis: Chronic tension-type headache, not intractable  Cervicalgia  Other symptoms and signs involving the musculoskeletal system  Abnormal posture     Problem List Patient Active Problem List   Diagnosis Date Noted  . Aortic atherosclerosis ([place]) 09/26/2017  . Fatty liver 11/09/2016  . Vitamin D deficiency 06/19/2016  . Chronic fatigue 06/19/2016  . Alopecia 06/19/2016  . Tendon nodule 01/16/2016  . Venous stasis ulcer of left lower extremity ([place]) 03/03/2015  . S/P right THA, AA 08/10/2014  . Primary osteoarthritis of right hip 05/27/2014  . Essential hypertension, benign 05/26/2014  . IFG (impaired fasting glucose) 01/25/2014  . Dyspnea 06/26/2013  . Vitreous detachment 09/23/2012  . Oral herpes 06/23/2012  . OCD (obsessive compulsive disorder) 05/09/2012  . Atypical ductal hyperplasia of breast 10/29/2011  . Abnormal mammogram with microcalcification 10/04/2011  . Obesity (BMI 30-39.9)  05/03/2011  . CARRIER/SUSPECTED CARRIER GROUP B STREPTOCOCCUS 11/17/2010  . Abnormal liver enzymes 10/25/2010  . SOMNOLENCE 11/25/2009  . Rheumatoid arthritis with rheumatoid factor ([place]) 11/30/2008  . POSTMENOPAUSAL STATUS 11/30/2008  . ARTHRITIS, HANDS, BILATERAL 12/22/2007  . LOC OSTEOARTHROS NOT SPEC PRIM/SEC LOWER LEG 03/19/2007  . Allergic rhinitis 11/05/2006  . VOCAL CORD DISORDER 11/05/2006  . Asthma with allergic rhinitis 11/05/2006  . Hypothyroidism 09/24/2006  . DEPRESSION 09/24/2006  . VERTIGO, BENIGN PAROXYSMAL POSITION 09/24/2006  . GERD 09/24/2006  . HYPERLIPIDEMIA 09/23/2006    Claudia Greenley Nilda Simmer PT, MPH  11/25/2017, 3:58 PM  [facility] [place], [place], [address] Phone: [phone]   Fax:  [phone]  Name: [name] MRN: [id number] Date of Birth: 29-Sep-1945

## 2017-11-28 ENCOUNTER — Ambulatory Visit (INDEPENDENT_AMBULATORY_CARE_PROVIDER_SITE_OTHER): Payer: Medicare Other | Admitting: Rehabilitative and Restorative Service Providers"

## 2017-11-28 ENCOUNTER — Ambulatory Visit: Payer: Medicare Other | Admitting: Podiatry

## 2017-11-28 ENCOUNTER — Encounter: Payer: Self-pay | Admitting: Rehabilitative and Restorative Service Providers"

## 2017-11-28 DIAGNOSIS — R293 Abnormal posture: Secondary | ICD-10-CM

## 2017-11-28 DIAGNOSIS — G44229 Chronic tension-type headache, not intractable: Secondary | ICD-10-CM | POA: Diagnosis not present

## 2017-11-28 DIAGNOSIS — R29898 Other symptoms and signs involving the musculoskeletal system: Secondary | ICD-10-CM

## 2017-11-28 DIAGNOSIS — M542 Cervicalgia: Secondary | ICD-10-CM | POA: Diagnosis not present

## 2017-11-28 NOTE — Patient Instructions (Addendum)
Shoulder Blade Squeeze: Arms at Sides    Arms at sides, parallel, elbows straight, palms up. Press pelvis down. Keep chin tucked. Squeeze backbone with shoulder blades, raising front of shoulders, chest, and arms. Keep head and neck neutral. Hold __1-2_ seconds. Relax. Repeat _10_ times.

## 2017-11-28 NOTE — Therapy (Signed)
[place], [place], [address] Phone: [phone]   Fax:  [phone]  Physical Therapy Treatment  Patient Details  Name: [name] MRN: [id number] Date of Birth: [birth year] Referring Provider (PT): Dr Madilyn Fireman    Encounter Date: 11/28/2017  PT End of Session - 11/28/17 1537    Visit Number  8    Number of Visits  12    Date for PT Re-Evaluation  12/03/17    PT Start Time  1534    PT Stop Time  1622    PT Time Calculation (min)  48 min    Activity Tolerance  Patient tolerated treatment well       Past Medical History:  Diagnosis Date  . Allergic rhinitis, cause unspecified   . Anxiety   . Benign paroxysmal positional vertigo   . Cancer ([place])    basal cell carcinoma per right side of nostril  . Depressive disorder, not elsewhere classified   . Diabetes mellitus without complication ([place])    noted per H&P with Dr Dianah Field 08/03/2014   . Esophageal reflux   . History of hiatal hernia   . Hypertension   . Localized osteoarthrosis not specified whether primary or secondary, lower leg   . Other and unspecified hyperlipidemia   . Other diseases of vocal cords   . Peptic ulcer, unspecified site, unspecified as acute or chronic, without mention of hemorrhage, perforation, or obstruction   . Pneumonia    bilat pneumonia 1987  . Shortness of breath dyspnea    with anxiety; with climbing stairs  . TMJ (temporomandibular joint disorder)   . Unspecified arthropathy, hand   . Unspecified asthma(493.90)    triggered with a virus   . Unspecified hypothyroidism     Past Surgical History:  Procedure Laterality Date  . BREAST BIOPSY  09/07/2011   High Risk Lesion  . BREAST EXCISIONAL BIOPSY Left   . BREAST SURGERY     LUMPECTOMY / LEFT 10/12/2011  . KNEE ARTHROSCOPY     bilat   . NASAL SINUS SURGERY    . ROTATOR CUFF REPAIR     right   . thumb surgery      left hand 45 years ago    . TOTAL HIP ARTHROPLASTY Right 08/10/2014   Procedure: RIGHT TOTAL HIP ARTHROPLASTY ANTERIOR APPROACH;  Surgeon: Paralee Cancel, MD;  Location: [facility];  Service: Orthopedics;  Laterality: Right;    There were no vitals filed for this visit.  Subjective Assessment - 11/28/17 1538    Subjective  Patient reports that she has had a very busy day. She has had some increased neck pain today. She awoke with no pain. Pain has increased with busy day. She wonders if this is related to stress.  Had some increased pain in the temple following last visit. Resolved after tylenol and sleep. Some soreness the next day.     Currently in Pain?  Yes    Pain Score  5     Pain Location  Neck    Pain Orientation  Right;Left    Pain Descriptors / Indicators  Aching;Dull         [facility] PT Assessment - 11/28/17 0001      Assessment   Medical Diagnosis  Rt sided HA    Referring Provider (PT)  Dr Madilyn Fireman     Onset Date/Surgical Date  10/13/16   worse in the past 7-8 months    Hand Dominance  Right  Next MD Visit  PRN      AROM   Cervical - Right Side Bend  36    Cervical - Left Side Bend  31                   [facility] Adult PT Treatment/Exercise - 11/28/17 0001      Shoulder Exercises: Prone   Other Prone Exercises  prone axial extension 2 sec x 10 reps       Shoulder Exercises: Standing   Extension  Strengthening;Right;Left;Theraband;10 reps    Theraband Level (Shoulder Extension)  Level 2 (Red)    Row  Strengthening;Right;Left;20 reps;Theraband    Theraband Level (Shoulder Row)  Level 2 (Red)    Retraction  Strengthening;Both;10 reps;Theraband    Theraband Level (Shoulder Retraction)  Level 1 (Yellow)      Shoulder Exercises: ROM/Strengthening   UBE (Upper Arm Bike)  L2: 2 min forward/2 min backward       Shoulder Exercises: Stretch   Other Shoulder Stretches  3 way doorway stretch 30 sec x 2 reps each position - verbal cues for correct technique       Moist Heat Therapy    Number Minutes Moist Heat  20 Minutes    Moist Heat Location  Cervical      Electrical Stimulation   Electrical Stimulation Location  bilat cervical paraspinals/ upper trap    Electrical Stimulation Action  IFC    Electrical Stimulation Parameters  to tolerance    Electrical Stimulation Goals  Pain;Tone      Manual Therapy   Manual therapy comments  pt prone     Soft tissue mobilization  deep tissue work through bilat cervical and upper trap musculature as well sa temporalis       Neck Exercises: Stretches   Upper Trapezius Stretch  Right;Left;1 rep;20 seconds    Other Neck Stretches  cervical rotation 2-3 sec x 3 reps - with chin tuck before rotation        Trigger Point Dry Needling - 11/28/17 1601    Consent Given?  Yes    Muscles Treated Upper Body  --   bilat cervical and upper trap    Upper Trapezius Response  Palpable increased muscle length    SubOccipitals Response  Palpable increased muscle length    Longissimus Response  Palpable increased muscle length           PT Education - 11/28/17 1555    Education Details  HEP     Person(s) Educated  Patient    Methods  Explanation;Demonstration;Tactile cues;Verbal cues;Handout    Comprehension  Verbalized understanding;Returned demonstration;Verbal cues required;Tactile cues required          PT Long Term Goals - 10/22/17 1253      PT LONG TERM GOAL #1   Title  Improve posture and alignment with patient to demonstrate improved upright posture with posterior shoulder girdle engaged 12/03/17    Time  6    Period  Weeks    Status  New      PT LONG TERM GOAL #2   Title  Increased cervical ROM by 5-10 degrees in all planes 12/03/17    Time  6    Period  Weeks    Status  New      PT LONG TERM GOAL #3   Title  Decrease frequency, intensity and duration of cervical pain and Rt sided headache by 50-75% allowing patient to perfrom normal functional activities with less limitation/restriction  12/03/17    Time  6     Period  Weeks    Status  New      PT LONG TERM GOAL #4   Title  Independent in HEP 12/03/17    Time  6    Period  Weeks    Status  New      PT LONG TERM GOAL #5   Title  Improve FOTO to </= 45% limitation 12/03/17    Time  6    Period  Weeks    Status  New            Plan - 11/28/17 1538    Clinical Impression Statement  Patient demonstrates improvement overall since beginning therapy. She continues to have episodic flare ups of pain which seem to be related to stress. Patient continues to respond well to DN/manual work. She demonstrates increased cervical rotation.     Rehab Potential  Good    PT Frequency  2x / week    PT Duration  6 weeks    PT Treatment/Interventions  Patient/family education;ADLs/Self Care Home Management;Cryotherapy;Electrical Stimulation;Iontophoresis 4mg /ml Dexamethasone;Moist Heat;Dry needling;Manual techniques;Neuromuscular re-education;Therapeutic activities;Therapeutic exercise    PT Next Visit Plan  HEP; continue manual work and DN for cervical and thoracic musculature; postural correction; modalities as indicated.     Consulted and Agree with Plan of Care  Patient       Patient will benefit from skilled therapeutic intervention in order to improve the following deficits and impairments:  Postural dysfunction, Improper body mechanics, Pain, Increased fascial restricitons, Increased muscle spasms, Decreased mobility, Decreased range of motion, Decreased activity tolerance  Visit Diagnosis: Chronic tension-type headache, not intractable  Cervicalgia  Other symptoms and signs involving the musculoskeletal system  Abnormal posture     Problem List Patient Active Problem List   Diagnosis Date Noted  . Aortic atherosclerosis ([place]) 09/26/2017  . Fatty liver 11/09/2016  . Vitamin D deficiency 06/19/2016  . Chronic fatigue 06/19/2016  . Alopecia 06/19/2016  . Tendon nodule 01/16/2016  . Venous stasis ulcer of left lower extremity ([place])  03/03/2015  . S/P right THA, AA 08/10/2014  . Primary osteoarthritis of right hip 05/27/2014  . Essential hypertension, benign 05/26/2014  . IFG (impaired fasting glucose) 01/25/2014  . Dyspnea 06/26/2013  . Vitreous detachment 09/23/2012  . Oral herpes 06/23/2012  . OCD (obsessive compulsive disorder) 05/09/2012  . Atypical ductal hyperplasia of breast 10/29/2011  . Abnormal mammogram with microcalcification 10/04/2011  . Obesity (BMI 30-39.9) 05/03/2011  . CARRIER/SUSPECTED CARRIER GROUP B STREPTOCOCCUS 11/17/2010  . Abnormal liver enzymes 10/25/2010  . SOMNOLENCE 11/25/2009  . Rheumatoid arthritis with rheumatoid factor ([place]) 11/30/2008  . POSTMENOPAUSAL STATUS 11/30/2008  . ARTHRITIS, HANDS, BILATERAL 12/22/2007  . LOC OSTEOARTHROS NOT SPEC PRIM/SEC LOWER LEG 03/19/2007  . Allergic rhinitis 11/05/2006  . VOCAL CORD DISORDER 11/05/2006  . Asthma with allergic rhinitis 11/05/2006  . Hypothyroidism 09/24/2006  . DEPRESSION 09/24/2006  . VERTIGO, BENIGN PAROXYSMAL POSITION 09/24/2006  . GERD 09/24/2006  . HYPERLIPIDEMIA 09/23/2006    Shashank Kwasnik Nilda Simmer PT, MPH  11/28/2017, 4:09 PM  [facility] [place], [place], [address] Phone: [phone]   Fax:  [phone]  Name: Lizet Kelso MRN: [id number] Date of Birth: 09-29-1945

## 2017-12-02 ENCOUNTER — Ambulatory Visit: Payer: Medicare Other | Admitting: Podiatry

## 2017-12-02 ENCOUNTER — Encounter: Payer: Medicare Other | Admitting: Physical Therapy

## 2017-12-04 DIAGNOSIS — R7301 Impaired fasting glucose: Secondary | ICD-10-CM | POA: Diagnosis not present

## 2017-12-04 DIAGNOSIS — E7800 Pure hypercholesterolemia, unspecified: Secondary | ICD-10-CM | POA: Diagnosis not present

## 2017-12-04 DIAGNOSIS — E039 Hypothyroidism, unspecified: Secondary | ICD-10-CM | POA: Diagnosis not present

## 2017-12-04 LAB — BASIC METABOLIC PANEL
BUN: 20 (ref 4–21)
CREATININE: 0.7 (ref 0.5–1.1)
Glucose: 104
Potassium: 4.5 (ref 3.4–5.3)
Sodium: 139 (ref 137–147)

## 2017-12-04 LAB — HEPATIC FUNCTION PANEL
ALT: 23 (ref 7–35)
AST: 13 (ref 13–35)

## 2017-12-04 LAB — HEMOGLOBIN A1C: Hemoglobin A1C: 6.1

## 2017-12-05 ENCOUNTER — Encounter: Payer: Medicare Other | Admitting: Rehabilitative and Restorative Service Providers"

## 2017-12-06 ENCOUNTER — Ambulatory Visit (INDEPENDENT_AMBULATORY_CARE_PROVIDER_SITE_OTHER): Payer: Medicare Other | Admitting: Rehabilitative and Restorative Service Providers"

## 2017-12-06 ENCOUNTER — Encounter: Payer: Self-pay | Admitting: Rehabilitative and Restorative Service Providers"

## 2017-12-06 DIAGNOSIS — M542 Cervicalgia: Secondary | ICD-10-CM

## 2017-12-06 DIAGNOSIS — G44229 Chronic tension-type headache, not intractable: Secondary | ICD-10-CM | POA: Diagnosis not present

## 2017-12-06 DIAGNOSIS — R293 Abnormal posture: Secondary | ICD-10-CM

## 2017-12-06 DIAGNOSIS — R29898 Other symptoms and signs involving the musculoskeletal system: Secondary | ICD-10-CM | POA: Diagnosis not present

## 2017-12-06 NOTE — Therapy (Addendum)
[place], [place], [address] Phone: [phone]   Fax:  [phone]  Physical Therapy Treatment  Patient Details  Name: [name] MRN: [id number] Date of Birth: 07-Apr-1945 Referring Provider (PT): Dr Madilyn Fireman    Encounter Date: 12/06/2017  PT End of Session - 12/06/17 1456    Visit Number  9    Number of Visits  20    Date for PT Re-Evaluation  01/17/18    PT Start Time  1423    PT Stop Time  1512    PT Time Calculation (min)  49 min    Activity Tolerance  Patient tolerated treatment well       Past Medical History:  Diagnosis Date  . Allergic rhinitis, cause unspecified   . Anxiety   . Benign paroxysmal positional vertigo   . Cancer ([place])    basal cell carcinoma per right side of nostril  . Depressive disorder, not elsewhere classified   . Diabetes mellitus without complication ([place])    noted per H&P with Dr Dianah Field 08/03/2014   . Esophageal reflux   . History of hiatal hernia   . Hypertension   . Localized osteoarthrosis not specified whether primary or secondary, lower leg   . Other and unspecified hyperlipidemia   . Other diseases of vocal cords   . Peptic ulcer, unspecified site, unspecified as acute or chronic, without mention of hemorrhage, perforation, or obstruction   . Pneumonia    bilat pneumonia 1987  . Shortness of breath dyspnea    with anxiety; with climbing stairs  . TMJ (temporomandibular joint disorder)   . Unspecified arthropathy, hand   . Unspecified asthma(493.90)    triggered with a virus   . Unspecified hypothyroidism     Past Surgical History:  Procedure Laterality Date  . BREAST BIOPSY  09/07/2011   High Risk Lesion  . BREAST EXCISIONAL BIOPSY Left   . BREAST SURGERY     LUMPECTOMY / LEFT 10/12/2011  . KNEE ARTHROSCOPY     bilat   . NASAL SINUS SURGERY    . ROTATOR CUFF REPAIR     right   . thumb surgery      left hand 45 years ago    . TOTAL HIP ARTHROPLASTY Right 08/10/2014   Procedure: RIGHT TOTAL HIP ARTHROPLASTY ANTERIOR APPROACH;  Surgeon: Paralee Cancel, MD;  Location: [facility];  Service: Orthopedics;  Laterality: Right;    There were no vitals filed for this visit.  Subjective Assessment - 12/06/17 1457    Subjective  Patient reports that she has had a very busy and streessful week. She hsa not felt great. No HA but having pain in the Rt temple area - occasionally on the Lt. "When it hurts it really hurts bad." She is concerned that she has a brain tumor.     Currently in Pain?  No/denies    Pain Score  0-No pain                       [facility] Adult PT Treatment/Exercise - 12/06/17 0001      Shoulder Exercises: Prone   Other Prone Exercises  pt reports difficulty with prone exercises due to pain in the anterior neck/chest area - requests that she not do prone exercises       Shoulder Exercises: ROM/Strengthening   UBE (Upper Arm Bike)  L2: 2 min forward/2 min backward       Shoulder  Exercises: Stretch   Other Shoulder Stretches  3 way doorway stretch 30 sec x 2 reps each position - verbal cues for correct technique       Neck Exercises: Stretches   Upper Trapezius Stretch  Right;Left;3 reps;20 seconds    Neck Stretch  --   axial extension 10 sec x 5    Other Neck Stretches  cervical rotation 2-3 sec x 3 reps - with chin tuck before rotation         Patient continued with posterior shoulder girdle strengthening and exercise program.  She received treatment including DN to bilat occipital and cervical musculature followed by manual work through the cervical spine and temple area.  Treatment was concluded with e-stim to bilat cervical and trap musculature with moist heat to same area  Tolerated all treatment well.          PT Long Term Goals - 12/06/17 1532      PT LONG TERM GOAL #1   Title  Improve posture and alignment with patient to demonstrate improved upright posture with  posterior shoulder girdle engaged 01/17/18    Time  12    Period  Weeks    Status  Revised      PT LONG TERM GOAL #2   Title  Increased cervical ROM by 5-10 degrees in all planes 01/17/18    Time  12    Period  Weeks    Status  Revised      PT LONG TERM GOAL #3   Title  Decrease frequency, intensity and duration of cervical pain and Rt sided headache by 50-75% allowing patient to perfrom normal functional activities with less limitation/restriction 12/03/17    Period  Weeks    Status  Achieved      PT LONG TERM GOAL #4   Title  Independent in HEP 01/17/18    Time  12    Period  Weeks    Status  Revised      PT LONG TERM GOAL #5   Title  Improve FOTO to </= 45% limitation 01/17/18    Time  12    Period  Weeks    Status  Revised            Plan - 12/06/17 1457    Clinical Impression Statement  Progressing well with decreased frequency, intensity and duration of headaches and cervical pain. Patient has some continued pain in the Rt temple which is likely related to muscular tightness in the scalp and temple. She has improved posture and alignment and increased cervical ROM. Patient is progressing well toward rehab goals and will benefit fro continued treatment.     Rehab Potential  Good    PT Frequency  2x / week    PT Duration  6 weeks    PT Treatment/Interventions  Patient/family education;ADLs/Self Care Home Management;Cryotherapy;Electrical Stimulation;Iontophoresis 4mg /ml Dexamethasone;Moist Heat;Dry needling;Manual techniques;Neuromuscular re-education;Therapeutic activities;Therapeutic exercise    PT Next Visit Plan  HEP; continue manual work and DN for cervical and thoracic musculature; postural correction; modalities as indicated. Patient will massage temple 5 min daily for 1 week to assess response for temple pain.     Consulted and Agree with Plan of Care  Patient       Patient will benefit from skilled therapeutic intervention in order to improve the following  deficits and impairments:  Postural dysfunction, Improper body mechanics, Pain, Increased fascial restricitons, Increased muscle spasms, Decreased mobility, Decreased range of motion, Decreased activity tolerance  Visit  Diagnosis: Chronic tension-type headache, not intractable - Plan: PT plan of care cert/re-cert  Cervicalgia - Plan: PT plan of care cert/re-cert  Other symptoms and signs involving the musculoskeletal system - Plan: PT plan of care cert/re-cert  Abnormal posture - Plan: PT plan of care cert/re-cert     Problem List Patient Active Problem List   Diagnosis Date Noted  . Aortic atherosclerosis ([place]) 09/26/2017  . Fatty liver 11/09/2016  . Vitamin D deficiency 06/19/2016  . Chronic fatigue 06/19/2016  . Alopecia 06/19/2016  . Tendon nodule 01/16/2016  . Venous stasis ulcer of left lower extremity ([place]) 03/03/2015  . S/P right THA, AA 08/10/2014  . Primary osteoarthritis of right hip 05/27/2014  . Essential hypertension, benign 05/26/2014  . IFG (impaired fasting glucose) 01/25/2014  . Dyspnea 06/26/2013  . Vitreous detachment 09/23/2012  . Oral herpes 06/23/2012  . OCD (obsessive compulsive disorder) 05/09/2012  . Atypical ductal hyperplasia of breast 10/29/2011  . Abnormal mammogram with microcalcification 10/04/2011  . Obesity (BMI 30-39.9) 05/03/2011  . CARRIER/SUSPECTED CARRIER GROUP B STREPTOCOCCUS 11/17/2010  . Abnormal liver enzymes 10/25/2010  . SOMNOLENCE 11/25/2009  . Rheumatoid arthritis with rheumatoid factor ([place]) 11/30/2008  . POSTMENOPAUSAL STATUS 11/30/2008  . ARTHRITIS, HANDS, BILATERAL 12/22/2007  . LOC OSTEOARTHROS NOT SPEC PRIM/SEC LOWER LEG 03/19/2007  . Allergic rhinitis 11/05/2006  . VOCAL CORD DISORDER 11/05/2006  . Asthma with allergic rhinitis 11/05/2006  . Hypothyroidism 09/24/2006  . DEPRESSION 09/24/2006  . VERTIGO, BENIGN PAROXYSMAL POSITION 09/24/2006  . GERD 09/24/2006  . HYPERLIPIDEMIA 09/23/2006    Drisana Schweickert Nilda Simmer PT, MPH   12/06/2017, 3:36 PM  [facility] [place], [place], [address] Phone: [phone]   Fax:  [phone]  Name: Teana Lindahl MRN: [id number] Date of Birth: September 17, 1945

## 2017-12-09 ENCOUNTER — Encounter: Payer: Self-pay | Admitting: Rehabilitative and Restorative Service Providers"

## 2017-12-09 ENCOUNTER — Ambulatory Visit (INDEPENDENT_AMBULATORY_CARE_PROVIDER_SITE_OTHER): Payer: Medicare Other | Admitting: Rehabilitative and Restorative Service Providers"

## 2017-12-09 DIAGNOSIS — R293 Abnormal posture: Secondary | ICD-10-CM

## 2017-12-09 DIAGNOSIS — M542 Cervicalgia: Secondary | ICD-10-CM

## 2017-12-09 DIAGNOSIS — R29898 Other symptoms and signs involving the musculoskeletal system: Secondary | ICD-10-CM | POA: Diagnosis not present

## 2017-12-09 DIAGNOSIS — G44229 Chronic tension-type headache, not intractable: Secondary | ICD-10-CM | POA: Diagnosis not present

## 2017-12-09 NOTE — Therapy (Signed)
[place], [place], [address] Phone: [phone]   Fax:  [phone]  Physical Therapy Treatment  Progress Note Reporting Period 10/22/17 to 12/09/17  See note below for Objective Data and Assessment of Progress/Goals.       Patient Details  Name: [name] MRN: [id number] Date of Birth: [birth year] Referring Provider (PT): Dr Madilyn Fireman    Encounter Date: 12/09/2017  PT End of Session - 12/09/17 1412    Visit Number  10    Number of Visits  20    Date for PT Re-Evaluation  01/17/18    PT Start Time  1411    PT Stop Time  1500    PT Time Calculation (min)  49 min    Activity Tolerance  Patient tolerated treatment well       Past Medical History:  Diagnosis Date  . Allergic rhinitis, cause unspecified   . Anxiety   . Benign paroxysmal positional vertigo   . Cancer ([place])    basal cell carcinoma per right side of nostril  . Depressive disorder, not elsewhere classified   . Diabetes mellitus without complication ([place])    noted per H&P with Dr Dianah Field 08/03/2014   . Esophageal reflux   . History of hiatal hernia   . Hypertension   . Localized osteoarthrosis not specified whether primary or secondary, lower leg   . Other and unspecified hyperlipidemia   . Other diseases of vocal cords   . Peptic ulcer, unspecified site, unspecified as acute or chronic, without mention of hemorrhage, perforation, or obstruction   . Pneumonia    bilat pneumonia 1987  . Shortness of breath dyspnea    with anxiety; with climbing stairs  . TMJ (temporomandibular joint disorder)   . Unspecified arthropathy, hand   . Unspecified asthma(493.90)    triggered with a virus   . Unspecified hypothyroidism     Past Surgical History:  Procedure Laterality Date  . BREAST BIOPSY  09/07/2011   High Risk Lesion  . BREAST EXCISIONAL BIOPSY Left   . BREAST SURGERY     LUMPECTOMY / LEFT 10/12/2011  . KNEE  ARTHROSCOPY     bilat   . NASAL SINUS SURGERY    . ROTATOR CUFF REPAIR     right   . thumb surgery      left hand 45 years ago   . TOTAL HIP ARTHROPLASTY Right 08/10/2014   Procedure: RIGHT TOTAL HIP ARTHROPLASTY ANTERIOR APPROACH;  Surgeon: Paralee Cancel, MD;  Location: [facility];  Service: Orthopedics;  Laterality: Right;    There were no vitals filed for this visit.  Subjective Assessment - 12/09/17 1412    Subjective  Patient reports that she has only had one episode of HA and neck tightness since last visit.     Currently in Pain?  No/denies         [facility] PT Assessment - 12/09/17 0001      Assessment   Medical Diagnosis  Rt sided HA    Referring Provider (PT)  Dr Madilyn Fireman     Onset Date/Surgical Date  10/13/16   worse in the past 7-8 months    Hand Dominance  Right    Next MD Visit  PRN      AROM   Cervical Flexion  55    Cervical Extension  46    Cervical - Right Side Bend  36    Cervical - Left Side Bend  31  Cervical - Right Rotation  70    Cervical - Left Rotation  66      Palpation   Palpation comment  improving muscular tightness Rt > Lt ant/lat/posterior cervical musculature; pecs; upper trap; leveator; teres                    [facility] Adult PT Treatment/Exercise - 12/09/17 0001      Shoulder Exercises: Standing   Extension  Strengthening;Right;Left;Theraband;10 reps    Theraband Level (Shoulder Extension)  Level 2 (Red)    Row  Strengthening;Right;Left;20 reps;Theraband    Theraband Level (Shoulder Row)  Level 2 (Red)    Retraction  Strengthening;Both;10 reps;Theraband    Theraband Level (Shoulder Retraction)  Level 2 (Red)      Shoulder Exercises: ROM/Strengthening   UBE (Upper Arm Bike)  L2: 3 min forward/2 min backward       Shoulder Exercises: Stretch   Other Shoulder Stretches  3 way doorway stretch 30 sec x 2 reps each position - verbal cues for correct technique       Moist Heat Therapy   Number Minutes Moist Heat  20 Minutes     Moist Heat Location  Cervical      Electrical Stimulation   Electrical Stimulation Location  bilat cervical paraspinals/ upper trap    Electrical Stimulation Action  IFC    Electrical Stimulation Parameters  to tolerance    Electrical Stimulation Goals  Pain;Tone      Manual Therapy   Manual therapy comments  pt prone     Joint Mobilization  CPA mobs upper thoracic and cervical spine     Soft tissue mobilization  deep tissue work through bilat cervical and upper trap musculature as well sa temporalis     Myofascial Release  suboccipital release; MFR to Rt side of scalp to ear      Neck Exercises: Stretches   Upper Trapezius Stretch  Right;Left;3 reps;20 seconds    Neck Stretch  --   axial extension 10 sec x 5    Other Neck Stretches  cervical rotation 2-3 sec x 3 reps - with chin tuck before rotation        Trigger Point Dry Needling - 12/09/17 1426    Consent Given?  Yes    Muscles Treated Upper Body  --   bilat cervical    Upper Trapezius Response  Palpable increased muscle length    SubOccipitals Response  Palpable increased muscle length    Longissimus Response  Palpable increased muscle length                PT Long Term Goals - 12/06/17 1532      PT LONG TERM GOAL #1   Title  Improve posture and alignment with patient to demonstrate improved upright posture with posterior shoulder girdle engaged 01/17/18    Time  12    Period  Weeks    Status  Revised      PT LONG TERM GOAL #2   Title  Increased cervical ROM by 5-10 degrees in all planes 01/17/18    Time  12    Period  Weeks    Status  Revised      PT LONG TERM GOAL #3   Title  Decrease frequency, intensity and duration of cervical pain and Rt sided headache by 50-75% allowing patient to perfrom normal functional activities with less limitation/restriction 12/03/17    Period  Weeks    Status  Achieved  PT LONG TERM GOAL #4   Title  Independent in HEP 01/17/18    Time  12    Period  Weeks     Status  Revised      PT LONG TERM GOAL #5   Title  Improve FOTO to </= 45% limitation 01/17/18    Time  12    Period  Weeks    Status  Revised            Plan - 12/09/17 1412    Clinical Impression Statement  Patient continues to progress well with rehab with DN and manual work. She is working on a basis simple exercise program for home as well as making some modifications in functional positions. Patient continues to exhibit physical response/flare up of symptoms to psychological stress.     Rehab Potential  Good    PT Frequency  2x / week    PT Duration  6 weeks    PT Treatment/Interventions  Patient/family education;ADLs/Self Care Home Management;Cryotherapy;Electrical Stimulation;Iontophoresis 4mg /ml Dexamethasone;Moist Heat;Dry needling;Manual techniques;Neuromuscular re-education;Therapeutic activities;Therapeutic exercise    PT Next Visit Plan  HEP; continue manual work and DN for cervical and thoracic musculature; postural correction; modalities as indicated. Patient will massage temple 5 min daily for 1 week to assess response for temple pain.     Consulted and Agree with Plan of Care  Patient       Patient will benefit from skilled therapeutic intervention in order to improve the following deficits and impairments:  Postural dysfunction, Improper body mechanics, Pain, Increased fascial restricitons, Increased muscle spasms, Decreased mobility, Decreased range of motion, Decreased activity tolerance  Visit Diagnosis: Chronic tension-type headache, not intractable  Cervicalgia  Other symptoms and signs involving the musculoskeletal system  Abnormal posture     Problem List Patient Active Problem List   Diagnosis Date Noted  . Aortic atherosclerosis ([place]) 09/26/2017  . Fatty liver 11/09/2016  . Vitamin D deficiency 06/19/2016  . Chronic fatigue 06/19/2016  . Alopecia 06/19/2016  . Tendon nodule 01/16/2016  . Venous stasis ulcer of left lower extremity ([place])  03/03/2015  . S/P right THA, AA 08/10/2014  . Primary osteoarthritis of right hip 05/27/2014  . Essential hypertension, benign 05/26/2014  . IFG (impaired fasting glucose) 01/25/2014  . Dyspnea 06/26/2013  . Vitreous detachment 09/23/2012  . Oral herpes 06/23/2012  . OCD (obsessive compulsive disorder) 05/09/2012  . Atypical ductal hyperplasia of breast 10/29/2011  . Abnormal mammogram with microcalcification 10/04/2011  . Obesity (BMI 30-39.9) 05/03/2011  . CARRIER/SUSPECTED CARRIER GROUP B STREPTOCOCCUS 11/17/2010  . Abnormal liver enzymes 10/25/2010  . SOMNOLENCE 11/25/2009  . Rheumatoid arthritis with rheumatoid factor ([place]) 11/30/2008  . POSTMENOPAUSAL STATUS 11/30/2008  . ARTHRITIS, HANDS, BILATERAL 12/22/2007  . LOC OSTEOARTHROS NOT SPEC PRIM/SEC LOWER LEG 03/19/2007  . Allergic rhinitis 11/05/2006  . VOCAL CORD DISORDER 11/05/2006  . Asthma with allergic rhinitis 11/05/2006  . Hypothyroidism 09/24/2006  . DEPRESSION 09/24/2006  . VERTIGO, BENIGN PAROXYSMAL POSITION 09/24/2006  . GERD 09/24/2006  . HYPERLIPIDEMIA 09/23/2006    Chirstina Haan Nilda Simmer PT, MPH  12/09/2017, 4:18 PM  [facility] [place], [place], [address] Phone: [phone]   Fax:  [phone]  Name: Suman Trivedi MRN: [id number] Date of Birth: October 17, 1945

## 2017-12-10 DIAGNOSIS — E559 Vitamin D deficiency, unspecified: Secondary | ICD-10-CM | POA: Diagnosis not present

## 2017-12-10 DIAGNOSIS — Z23 Encounter for immunization: Secondary | ICD-10-CM | POA: Diagnosis not present

## 2017-12-10 DIAGNOSIS — R7301 Impaired fasting glucose: Secondary | ICD-10-CM | POA: Diagnosis not present

## 2017-12-10 DIAGNOSIS — R5382 Chronic fatigue, unspecified: Secondary | ICD-10-CM | POA: Diagnosis not present

## 2017-12-10 DIAGNOSIS — L659 Nonscarring hair loss, unspecified: Secondary | ICD-10-CM | POA: Diagnosis not present

## 2017-12-10 DIAGNOSIS — I10 Essential (primary) hypertension: Secondary | ICD-10-CM | POA: Diagnosis not present

## 2017-12-10 DIAGNOSIS — E7800 Pure hypercholesterolemia, unspecified: Secondary | ICD-10-CM | POA: Diagnosis not present

## 2017-12-10 DIAGNOSIS — E039 Hypothyroidism, unspecified: Secondary | ICD-10-CM | POA: Diagnosis not present

## 2017-12-12 ENCOUNTER — Encounter: Payer: Medicare Other | Admitting: Rehabilitative and Restorative Service Providers"

## 2017-12-17 DIAGNOSIS — F422 Mixed obsessional thoughts and acts: Secondary | ICD-10-CM | POA: Diagnosis not present

## 2017-12-17 DIAGNOSIS — F902 Attention-deficit hyperactivity disorder, combined type: Secondary | ICD-10-CM | POA: Diagnosis not present

## 2017-12-17 DIAGNOSIS — F410 Panic disorder [episodic paroxysmal anxiety] without agoraphobia: Secondary | ICD-10-CM | POA: Diagnosis not present

## 2017-12-19 ENCOUNTER — Encounter: Payer: Self-pay | Admitting: Rehabilitative and Restorative Service Providers"

## 2017-12-19 ENCOUNTER — Ambulatory Visit (INDEPENDENT_AMBULATORY_CARE_PROVIDER_SITE_OTHER): Payer: Medicare Other | Admitting: Rehabilitative and Restorative Service Providers"

## 2017-12-19 DIAGNOSIS — G44229 Chronic tension-type headache, not intractable: Secondary | ICD-10-CM

## 2017-12-19 DIAGNOSIS — R293 Abnormal posture: Secondary | ICD-10-CM

## 2017-12-19 DIAGNOSIS — M542 Cervicalgia: Secondary | ICD-10-CM | POA: Diagnosis not present

## 2017-12-19 DIAGNOSIS — R29898 Other symptoms and signs involving the musculoskeletal system: Secondary | ICD-10-CM

## 2017-12-19 NOTE — Patient Instructions (Addendum)
Extensors, Sitting KEEP HEAD TUCKED!!    Sit or stand, hands clasped behind head. Gently push head down. Hold _5-10 __ seconds. Repeat __3-4_ times per session. Do _3-4 __ sessions per day.

## 2017-12-19 NOTE — Therapy (Addendum)
[place], [place], [address] Phone: [phone]   Fax:  [phone]  Physical Therapy Treatment  Patient Details  Name: [name] MRN: [id number] Date of Birth: 1945/03/08 Referring Provider (PT): Dr Madilyn Fireman    Encounter Date: 12/19/2017  PT End of Session - 12/19/17 1629    Visit Number  11    Number of Visits  20    Date for PT Re-Evaluation  01/17/18    PT Start Time  1622    PT Stop Time  1709    PT Time Calculation (min)  47 min    Activity Tolerance  Patient tolerated treatment well       Past Medical History:  Diagnosis Date  . Allergic rhinitis, cause unspecified   . Anxiety   . Benign paroxysmal positional vertigo   . Cancer ([place])    basal cell carcinoma per right side of nostril  . Depressive disorder, not elsewhere classified   . Diabetes mellitus without complication ([place])    noted per H&P with Dr Dianah Field 08/03/2014   . Esophageal reflux   . History of hiatal hernia   . Hypertension   . Localized osteoarthrosis not specified whether primary or secondary, lower leg   . Other and unspecified hyperlipidemia   . Other diseases of vocal cords   . Peptic ulcer, unspecified site, unspecified as acute or chronic, without mention of hemorrhage, perforation, or obstruction   . Pneumonia    bilat pneumonia 1987  . Shortness of breath dyspnea    with anxiety; with climbing stairs  . TMJ (temporomandibular joint disorder)   . Unspecified arthropathy, hand   . Unspecified asthma(493.90)    triggered with a virus   . Unspecified hypothyroidism     Past Surgical History:  Procedure Laterality Date  . BREAST BIOPSY  09/07/2011   High Risk Lesion  . BREAST EXCISIONAL BIOPSY Left   . BREAST SURGERY     LUMPECTOMY / LEFT 10/12/2011  . KNEE ARTHROSCOPY     bilat   . NASAL SINUS SURGERY    . ROTATOR CUFF REPAIR     right   . thumb surgery      left hand 45 years ago    . TOTAL HIP ARTHROPLASTY Right 08/10/2014   Procedure: RIGHT TOTAL HIP ARTHROPLASTY ANTERIOR APPROACH;  Surgeon: Paralee Cancel, MD;  Location: [facility];  Service: Orthopedics;  Laterality: Right;    There were no vitals filed for this visit.  Subjective Assessment - 12/19/17 1630    Subjective  Patient reports that she does not have pain in the neck at this time but she has pain Rt > Lt when she looks down.     Currently in Pain?  No/denies         [facility] PT Assessment - 12/19/17 0001      Assessment   Medical Diagnosis  Rt sided HA    Referring Provider (PT)  Dr Madilyn Fireman     Onset Date/Surgical Date  10/13/16   worse in the past 7-8 months    Hand Dominance  Right    Next MD Visit  PRN      AROM   Cervical Flexion  50    Cervical Extension  46    Cervical - Right Side Bend  38    Cervical - Left Side Bend  40    Cervical - Right Rotation  76    Cervical - Left Rotation  74      Palpation   Palpation comment  improving muscular tightness Rt > Lt ant/lat/posterior cervical musculature; pecs; upper trap; leveator; teres                    [facility] Adult PT Treatment/Exercise - 12/19/17 0001      Shoulder Exercises: Standing   Extension  Strengthening;Right;Left;Theraband;10 reps    Theraband Level (Shoulder Extension)  Level 2 (Red)    Row  Strengthening;Right;Left;20 reps;Theraband    Theraband Level (Shoulder Row)  Level 2 (Red)    Retraction  Strengthening;Both;10 reps;Theraband    Theraband Level (Shoulder Retraction)  Level 2 (Red)      Shoulder Exercises: ROM/Strengthening   UBE (Upper Arm Bike)  L2: 3 min forward/2 min backward       Shoulder Exercises: Stretch   Other Shoulder Stretches  3 way doorway stretch 30 sec x 2 reps each position - verbal cues for correct technique       Moist Heat Therapy   Number Minutes Moist Heat  20 Minutes    Moist Heat Location  Cervical      Electrical Stimulation   Electrical Stimulation Location  bilat cervical  paraspinals/ upper trap    Electrical Stimulation Action  IFC    Electrical Stimulation Parameters  to tolerance     Electrical Stimulation Goals  Pain;Tone      Manual Therapy   Manual therapy comments  pt prone     Joint Mobilization  CPA mobs upper thoracic and cervical spine     Soft tissue mobilization  deep tissue work through bilat cervical and upper trap musculature as well sa temporalis     Myofascial Release  suboccipital release; MFR to Rt side of scalp to ear      Neck Exercises: Stretches   Upper Trapezius Stretch  Right;Left;3 reps;20 seconds    Neck Stretch  --   axial extension 10 sec x 5    Lower Cervical/Upper Thoracic Stretch Limitations  cervical flexion with gentle overpressure 10 sec x 4 reps     Other Neck Stretches  cervical rotation 2-3 sec x 3 reps - with chin tuck before rotation        Trigger Point Dry Needling - 12/19/17 1654    Consent Given?  Yes    Muscles Treated Upper Body  --   bilat    Upper Trapezius Response  Palpable increased muscle length    SubOccipitals Response  Palpable increased muscle length    Longissimus Response  Palpable increased muscle length           PT Education - 12/19/17 1652    Education Details  HEP     Person(s) Educated  Patient    Methods  Explanation;Demonstration;Tactile cues;Verbal cues;Handout    Comprehension  Verbalized understanding;Returned demonstration;Verbal cues required;Tactile cues required          PT Long Term Goals - 12/06/17 1532      PT LONG TERM GOAL #1   Title  Improve posture and alignment with patient to demonstrate improved upright posture with posterior shoulder girdle engaged 01/17/18    Time  12    Period  Weeks    Status  Revised      PT LONG TERM GOAL #2   Title  Increased cervical ROM by 5-10 degrees in all planes 01/17/18    Time  12    Period  Weeks    Status  Revised  PT LONG TERM GOAL #3   Title  Decrease frequency, intensity and duration of cervical pain and  Rt sided headache by 50-75% allowing patient to perfrom normal functional activities with less limitation/restriction 12/03/17    Period  Weeks    Status  Achieved      PT LONG TERM GOAL #4   Title  Independent in HEP 01/17/18    Time  12    Period  Weeks    Status  Revised      PT LONG TERM GOAL #5   Title  Improve FOTO to </= 45% limitation 01/17/18    Time  12    Period  Weeks    Status  Revised            Plan - 12/19/17 1630    Clinical Impression Statement  Rever reports that she has increased pain in the neck when she bends her head and neck forward. She has continued tightness through the posterior cervical spine - added gentle assisted cervical stretch for HEP. Note increase in cervical ROM. Gradually progressing. Needs to be consistent with HEP.     Rehab Potential  Good    PT Frequency  2x / week    PT Duration  6 weeks    PT Treatment/Interventions  Patient/family education;ADLs/Self Care Home Management;Cryotherapy;Electrical Stimulation;Iontophoresis 67m/ml Dexamethasone;Moist Heat;Dry needling;Manual techniques;Neuromuscular re-education;Therapeutic activities;Therapeutic exercise    PT Next Visit Plan  HEP; continue manual work and DN for cervical and thoracic musculature; postural correction; modalities as indicated. Patient will massage temple 5 min daily for 1 week to assess response for temple pain.     Consulted and Agree with Plan of Care  Patient       Patient will benefit from skilled therapeutic intervention in order to improve the following deficits and impairments:  Postural dysfunction, Improper body mechanics, Pain, Increased fascial restricitons, Increased muscle spasms, Decreased mobility, Decreased range of motion, Decreased activity tolerance  Visit Diagnosis: Chronic tension-type headache, not intractable  Cervicalgia  Other symptoms and signs involving the musculoskeletal system  Abnormal posture     Problem List Patient Active  Problem List   Diagnosis Date Noted  . Aortic atherosclerosis ([place] 09/26/2017  . Fatty liver 11/09/2016  . Vitamin D deficiency 06/19/2016  . Chronic fatigue 06/19/2016  . Alopecia 06/19/2016  . Tendon nodule 01/16/2016  . Venous stasis ulcer of left lower extremity ([place] 03/03/2015  . S/P right THA, AA 08/10/2014  . Primary osteoarthritis of right hip 05/27/2014  . Essential hypertension, benign 05/26/2014  . IFG (impaired fasting glucose) 01/25/2014  . Dyspnea 06/26/2013  . Vitreous detachment 09/23/2012  . Oral herpes 06/23/2012  . OCD (obsessive compulsive disorder) 05/09/2012  . Atypical ductal hyperplasia of breast 10/29/2011  . Abnormal mammogram with microcalcification 10/04/2011  . Obesity (BMI 30-39.9) 05/03/2011  . CARRIER/SUSPECTED CARRIER GROUP B STREPTOCOCCUS 11/17/2010  . Abnormal liver enzymes 10/25/2010  . SOMNOLENCE 11/25/2009  . Rheumatoid arthritis with rheumatoid factor ([place] 11/30/2008  . POSTMENOPAUSAL STATUS 11/30/2008  . ARTHRITIS, HANDS, BILATERAL 12/22/2007  . LOC OSTEOARTHROS NOT SPEC PRIM/SEC LOWER LEG 03/19/2007  . Allergic rhinitis 11/05/2006  . VOCAL CORD DISORDER 11/05/2006  . Asthma with allergic rhinitis 11/05/2006  . Hypothyroidism 09/24/2006  . DEPRESSION 09/24/2006  . VERTIGO, BENIGN PAROXYSMAL POSITION 09/24/2006  . GERD 09/24/2006  . HYPERLIPIDEMIA 09/23/2006    Celyn PNilda SimmerPT, MPH  12/19/2017, 5:59 PM  [facility] [address]  Phone: [phone]   Fax:  [phone]  Name: [name] MRN: [id number] Date of Birth: 06/06/1945  PHYSICAL THERAPY DISCHARGE SUMMARY  Visits from Start of Care: 11  Current functional level related to goals / functional outcomes: See progress note for discharge status    Remaining deficits: Intermittent cervical tightness relieved with stretching and postural correction    Education / Equipment: HEP   Plan: Patient agrees to discharge.  Patient goals were met. Patient is being discharged due to meeting the stated rehab goals.  ?????    Celyn P. Helene Kelp PT, MPH 01/21/18 2:06 PM

## 2017-12-23 IMAGING — CR DG CHEST 2V
2 series · 2 of 2 positions shown · non-contrast
Comparison: 04/13/2013 and 01/16/2013.

CLINICAL DATA: Cough, congestion and slight fever for 1 week.
Nonsmoker.

EXAM:
CHEST  2 VIEW

[chest pa]
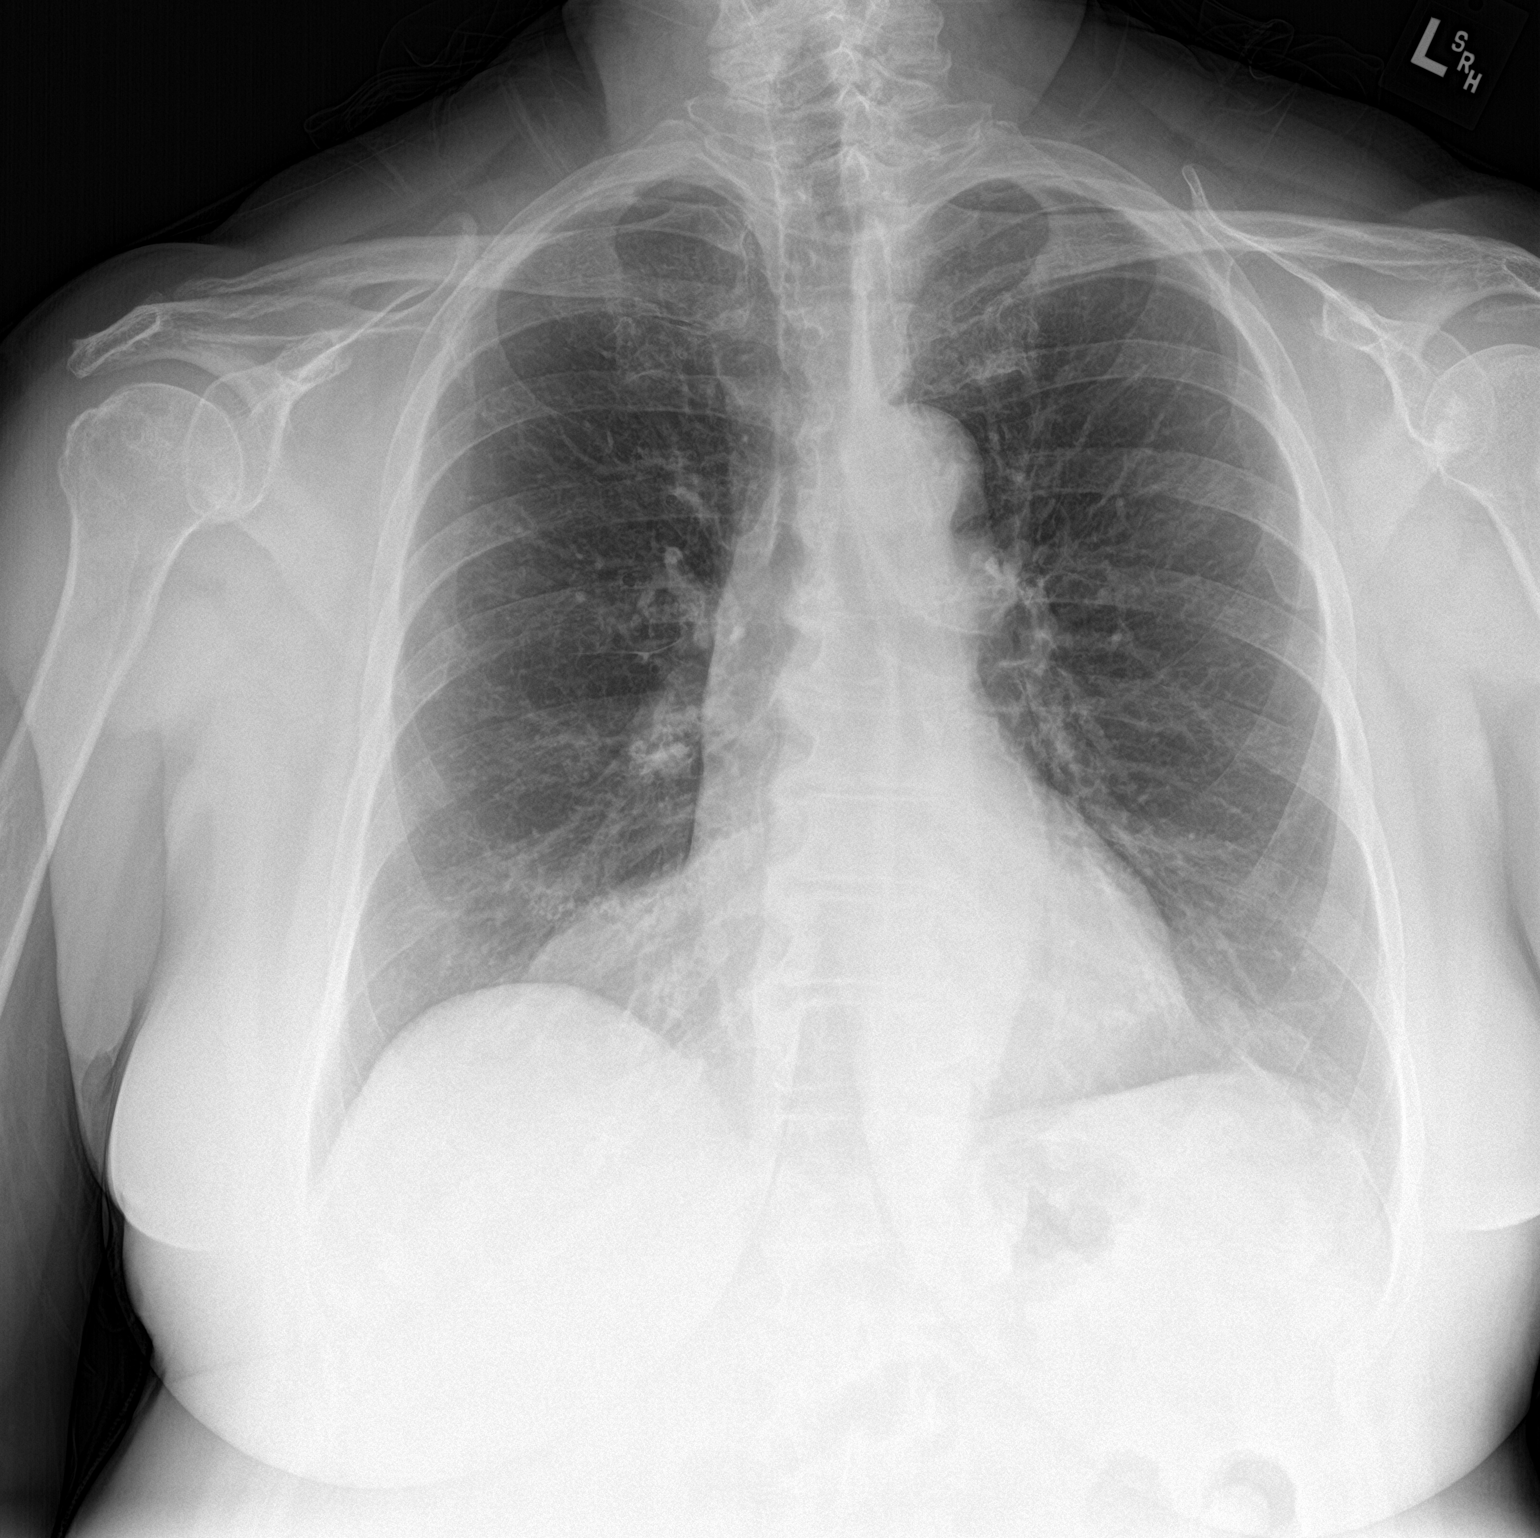

[chest lat]
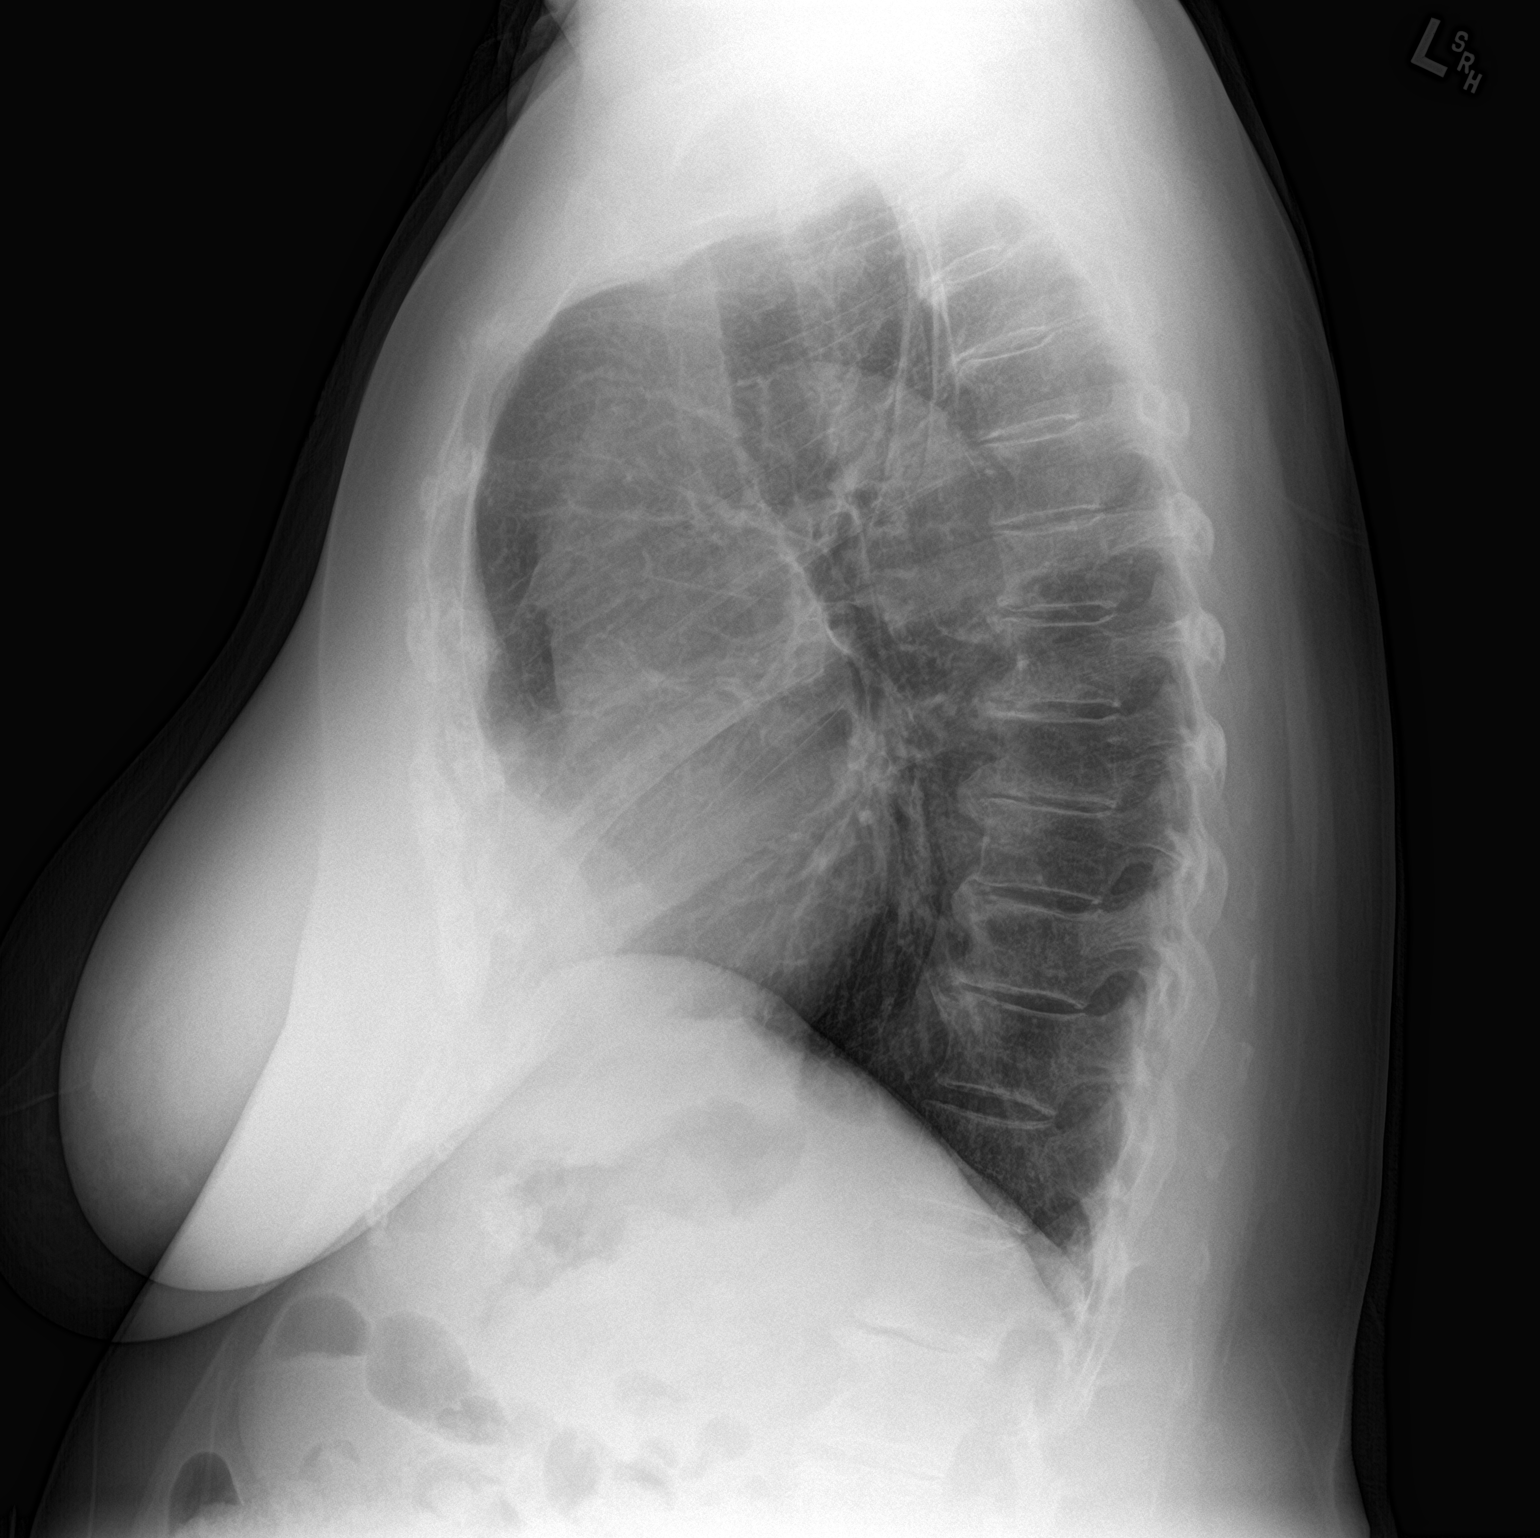

[2 of 2 positions shown; findings below may reference images not displayed]

FINDINGS: The heart size and mediastinal contours are stable. Density at the
right cardiophrenic angle is unchanged, corresponding with reported
epicardial fat on prior CT. The lungs are clear. There is no pleural
effusion or pneumothorax. No acute osseous findings are seen. There
is a mild pectus deformity of the sternum.
IMPRESSION: Stable chest.  No active cardiopulmonary process.

## 2018-01-03 DIAGNOSIS — M25561 Pain in right knee: Secondary | ICD-10-CM | POA: Diagnosis not present

## 2018-01-03 DIAGNOSIS — M170 Bilateral primary osteoarthritis of knee: Secondary | ICD-10-CM | POA: Diagnosis not present

## 2018-01-03 DIAGNOSIS — M25562 Pain in left knee: Secondary | ICD-10-CM | POA: Diagnosis not present

## 2018-01-06 DIAGNOSIS — F902 Attention-deficit hyperactivity disorder, combined type: Secondary | ICD-10-CM | POA: Diagnosis not present

## 2018-01-06 DIAGNOSIS — F410 Panic disorder [episodic paroxysmal anxiety] without agoraphobia: Secondary | ICD-10-CM | POA: Diagnosis not present

## 2018-01-06 DIAGNOSIS — F422 Mixed obsessional thoughts and acts: Secondary | ICD-10-CM | POA: Diagnosis not present

## 2018-01-14 ENCOUNTER — Other Ambulatory Visit: Payer: Self-pay | Admitting: Family Medicine

## 2018-02-07 DIAGNOSIS — S0501XA Injury of conjunctiva and corneal abrasion without foreign body, right eye, initial encounter: Secondary | ICD-10-CM | POA: Diagnosis not present

## 2018-02-07 DIAGNOSIS — H5711 Ocular pain, right eye: Secondary | ICD-10-CM | POA: Diagnosis not present

## 2018-02-10 ENCOUNTER — Ambulatory Visit: Payer: Medicare Other | Admitting: Adult Health

## 2018-02-17 ENCOUNTER — Ambulatory Visit (INDEPENDENT_AMBULATORY_CARE_PROVIDER_SITE_OTHER): Payer: Medicare Other | Admitting: Adult Health

## 2018-02-17 ENCOUNTER — Encounter: Payer: Self-pay | Admitting: Adult Health

## 2018-02-17 DIAGNOSIS — J4531 Mild persistent asthma with (acute) exacerbation: Secondary | ICD-10-CM | POA: Diagnosis not present

## 2018-02-17 MED ORDER — PREDNISONE 10 MG PO TABS: ORAL_TABLET | ORAL | 0 refills | Status: DC

## 2018-02-17 NOTE — Patient Instructions (Signed)
Increase Symbicort 2 puffs Twice daily for 2 weeks then 2  puffs daily .  Saline nasal rinses As needed   Continue on Allegra daily  Flonase 1 puff daily for 1 week then As needed   Delsym 2 tsp Twice daily  As needed  Cough .  Prednisone taper over next week.  Please contact office for sooner follow up if symptoms do not improve or worsen or seek emergency care  Follow up Dr. Elsworth Soho  In 4-6 months and As needed

## 2018-02-17 NOTE — Progress Notes (Signed)
@Patient  ID: [name], female    DOB: 03-27-1945, 73 y.o.   MRN: [id number]  Chief Complaint  Patient presents with  . Acute Visit    Asthma     Referring provider: Hali Marry, *  HPI:                                                                                                                                                        73yo female former smoker followed for mild persistent asthma, allergic rhinitis and chronic cough with vocal cord dysfunction  Significant tests/ events :  Spirometry 06/2013 ratio 63 FEV1 normal.                                                                                                                                                                                                                                                                                                                                                          02/17/2018 Acute OV : Asthma  Patient presents for an acute office visit.  She complains of 2 weeks of wheezing, cough and increased shortness of breath.  Says that  she feels good has no congestion or fever.  But dry cough and wheezing will not stop.  She remains on Symbicort 2 puffs daily.  She is on Allegra.  She denies any chest pain orthopnea PND or increased leg swelling  Allergies  Allergen Reactions  . Astelin [Azelastine Hcl] Other (See Comments)    Headache  . Lisinopril     ACE cough  . Morphine     REACTION: Nausea  . Moxifloxacin     REACTION: confusion, dizziness, paranoia    Immunization History  Administered Date(s) Administered  . Influenza Split 11/07/2011, 01/12/2013  . Influenza Whole 02/12/2005, 12/02/2007, 11/24/2008  . Influenza, High Dose Seasonal PF 11/19/2016, 12/10/2017  . Influenza,inj,Quad PF,6+ Mos 01/11/2015  . Influenza-Unspecified 11/12/2013  . Pneumococcal Conjugate-13 04/26/2014  . Pneumococcal Polysaccharide-23 02/12/2001, 11/27/2001, 11/28/2006, 11/07/2011  .  Td 02/12/1998, 06/13/1998  . Tdap 09/23/2012    Past Medical History:  Diagnosis Date  . Allergic rhinitis, cause unspecified   . Anxiety   . Benign paroxysmal positional vertigo   . Cancer ([place])    basal cell carcinoma per right side of nostril  . Depressive disorder, not elsewhere classified   . Diabetes mellitus without complication ([place])    noted per H&P with Dr Dianah Field 08/03/2014   . Esophageal reflux   . History of hiatal hernia   . Hypertension   . Localized osteoarthrosis not specified whether primary or secondary, lower leg   . Other and unspecified hyperlipidemia   . Other diseases of vocal cords   . Peptic ulcer, unspecified site, unspecified as acute or chronic, without mention of hemorrhage, perforation, or obstruction   . Pneumonia    bilat pneumonia 1987  . Shortness of breath dyspnea    with anxiety; with climbing stairs  . TMJ (temporomandibular joint disorder)   . Unspecified arthropathy, hand   . Unspecified asthma(493.90)    triggered with a virus   . Unspecified hypothyroidism     Tobacco History: Social History   Tobacco Use  Smoking Status Former Smoker  . Packs/day: 0.30  . Years: 2.00  . Pack years: 0.60  . Types: Cigarettes  . Last attempt to quit: 02/12/1958  . Years since quitting: 60.0  Smokeless Tobacco Never Used   Counseling given: Not Answered   Outpatient Medications Prior to Visit  Medication Sig Dispense Refill  . albuterol (PROVENTIL HFA) 108 (90 Base) MCG/ACT inhaler Inhale 2 puffs into the lungs every 6 (six) hours as needed for shortness of breath. 6.7 g 5  . AMBULATORY NON FORMULARY MEDICATION APPLY 0 5GM TO AFFECTED PAINFUL AREAS UP TO 3 TIMES A DAY  0 5GM   1 PUMP C-AUTH SUB W/KETAMINE  3  . AMBULATORY NON FORMULARY MEDICATION APPLY 0.5GM TO AFFECTED PAINFUL AREAS UP TO 3 TIMES A DAY. 0.5GM = 1 PUMP C-AUTH SUB W/KETAMINE  3  . atorvastatin (LIPITOR) 10 MG tablet Take 10 mg by mouth daily.  3  . Calcium Carbonate-Vit  D-Min 600-400 MG-UNIT TABS Take 1 tablet by mouth 2 (two) times daily.     . clindamycin (CLEOCIN T) 1 % lotion Apply 1 application topically 2 (two) times daily as needed (bumps). 60 mL 1  . famotidine (PEPCID) 10 MG tablet Take 10 mg by mouth at bedtime.    . fexofenadine (ALLEGRA) 180 MG tablet Take 180 mg by mouth at bedtime.    . fluticasone (FLONASE) 50 MCG/ACT nasal spray USE 2 SPRAYS IN EACH NOSTRIL EVERY DAY (Patient taking  differently: Place 2 sprays into both nostrils as needed. ) 16 g 5  . folic acid (FOLVITE) 1 MG tablet Take 1 mg by mouth 2 (two) times daily.      . hydrochlorothiazide (HYDRODIURIL) 25 MG tablet TAKE 1 TABLET BY MOUTH EVERY DAY 90 tablet 2  . levothyroxine (SYNTHROID, LEVOTHROID) 75 MCG tablet Take 75 mcg by mouth daily. Except Sunday 88 mcg    . levothyroxine (SYNTHROID, LEVOTHROID) 88 MCG tablet Take only sunday     . losartan (COZAAR) 50 MG tablet TAKE 1 TABLET BY MOUTH EVERY DAY 90 tablet 3  . meloxicam (MOBIC) 15 MG tablet TAKE 1 TABLET BY MOUTH EVERY MORNING 90 tablet 1  . metroNIDAZOLE (METROGEL) 1 % gel APPLY EVERY DAY (Patient taking differently: as needed. ) 60 g 1  . minoxidil (ROGAINE) 2 % external solution Apply 1 application topically 2 (two) times daily.    . ondansetron (ZOFRAN) 4 MG tablet Take 1 tablet (4 mg total) by mouth every 8 (eight) hours as needed for nausea or vomiting. 12 tablet 0  . pantoprazole (PROTONIX) 40 MG tablet Take 1 tablet (40 mg total) by mouth daily. 90 tablet 3  . PARoxetine (PAXIL-CR) 12.5 MG 24 hr tablet Take 12.5 mg by mouth 2 (two) times daily.     . Saline (SIMPLY SALINE) 0.9 % AERS Place 1 spray into the nose 2 (two) times daily as needed (dry).    . SYMBICORT 80-4.5 MCG/ACT inhaler INHALE 2 PUFFS TWICE A DAY (Patient taking differently: 1 puff. ) 30.6 Inhaler 3   No facility-administered medications prior to visit.      Review of Systems:   Constitutional:   No  weight loss, night sweats,  Fevers, chills,  fatigue, or  lassitude.  HEENT:   No headaches,  Difficulty swallowing,  Tooth/dental problems, or  Sore throat,                No sneezing, itching, ear ache, + nasal congestion, post nasal drip,   CV:  No chest pain,  Orthopnea, PND, swelling in lower extremities, anasarca, dizziness, palpitations, syncope.   GI  No heartburn, indigestion, abdominal pain, nausea, vomiting, diarrhea, change in bowel habits, loss of appetite, bloody stools.   Resp:  .  No chest wall deformity  Skin: no rash or lesions.  GU: no dysuria, change in color of urine, no urgency or frequency.  No flank pain, no hematuria   MS:  No joint pain or swelling.  No decreased range of motion.  No back pain.    Physical Exam  BP 104/60 (BP Location: Left Arm, Cuff Size: Normal)   Pulse 83   Ht 5\' 2"  (1.575 m)   Wt 185 lb (83.9 kg)   SpO2 96%   BMI 33.84 kg/m   GEN: A/Ox3; pleasant , NAD, well nourished    HEENT:  /AT,  EACs-clear, TMs-wnl, NOSE-clear, THROAT-clear, no lesions, no postnasal drip or exudate noted.   NECK:  Supple w/ fair ROM; no JVD; normal carotid impulses w/o bruits; no thyromegaly or nodules palpated; no lymphadenopathy.    RESP  Faint exp wheeze on forced exp . no accessory muscle use, no dullness to percussion  CARD:  RRR, no m/r/g, no peripheral edema, pulses intact, no cyanosis or clubbing.  GI:   Soft & nt; nml bowel sounds; no organomegaly or masses detected.   Musco: Warm bil, no deformities or joint swelling noted.   Neuro: alert, no focal deficits noted.  Skin: Warm, no lesions or rashes    Lab Results:  CBC  BNP  Imaging: No results found.    No flowsheet data found.  No results found for: NITRICOXIDE      Assessment & Plan:   No problem-specific Assessment & Plan notes found for this encounter.     Rexene Edison, NP 02/17/2018

## 2018-02-18 DIAGNOSIS — F410 Panic disorder [episodic paroxysmal anxiety] without agoraphobia: Secondary | ICD-10-CM | POA: Diagnosis not present

## 2018-02-18 DIAGNOSIS — F902 Attention-deficit hyperactivity disorder, combined type: Secondary | ICD-10-CM | POA: Diagnosis not present

## 2018-02-18 DIAGNOSIS — F422 Mixed obsessional thoughts and acts: Secondary | ICD-10-CM | POA: Diagnosis not present

## 2018-02-18 NOTE — Assessment & Plan Note (Signed)
Flare   Plan  Patient Instructions  Increase Symbicort 2 puffs Twice daily for 2 weeks then 2  puffs daily .  Saline nasal rinses As needed   Continue on Allegra daily  Flonase 1 puff daily for 1 week then As needed   Delsym 2 tsp Twice daily  As needed  Cough .  Prednisone taper over next week.  Please contact office for sooner follow up if symptoms do not improve or worsen or seek emergency care  Follow up Dr. Elsworth Soho  In 4-6 months and As needed

## 2018-03-04 DIAGNOSIS — F422 Mixed obsessional thoughts and acts: Secondary | ICD-10-CM | POA: Diagnosis not present

## 2018-03-04 DIAGNOSIS — F410 Panic disorder [episodic paroxysmal anxiety] without agoraphobia: Secondary | ICD-10-CM | POA: Diagnosis not present

## 2018-03-04 DIAGNOSIS — F902 Attention-deficit hyperactivity disorder, combined type: Secondary | ICD-10-CM | POA: Diagnosis not present

## 2018-03-12 ENCOUNTER — Encounter: Payer: Self-pay | Admitting: Family Medicine

## 2018-03-12 ENCOUNTER — Telehealth: Payer: Self-pay

## 2018-03-12 MED ORDER — OSELTAMIVIR PHOSPHATE 75 MG PO CAPS: 75.0000 mg | ORAL_CAPSULE | Freq: Every day | ORAL | 0 refills | Status: DC

## 2018-03-12 NOTE — Telephone Encounter (Signed)
Abstracted

## 2018-03-12 NOTE — Telephone Encounter (Signed)
Left message advising of recommendations.  

## 2018-03-12 NOTE — Telephone Encounter (Signed)
Prescription sent.  I was able to find a recent renal function from October in care everywhere.  Can we also please enter labs from October from care everywhere into her chart.

## 2018-03-12 NOTE — Telephone Encounter (Signed)
[name] states he and his wife is taking care of his grandson and he was diagnosed with the Flu B. She reports no symptoms. He would like for her to get Tamiflu.

## 2018-03-18 DIAGNOSIS — F410 Panic disorder [episodic paroxysmal anxiety] without agoraphobia: Secondary | ICD-10-CM | POA: Diagnosis not present

## 2018-03-18 DIAGNOSIS — F902 Attention-deficit hyperactivity disorder, combined type: Secondary | ICD-10-CM | POA: Diagnosis not present

## 2018-03-18 DIAGNOSIS — F422 Mixed obsessional thoughts and acts: Secondary | ICD-10-CM | POA: Diagnosis not present

## 2018-03-21 ENCOUNTER — Other Ambulatory Visit: Payer: Self-pay | Admitting: Family Medicine

## 2018-04-11 ENCOUNTER — Other Ambulatory Visit: Payer: Self-pay | Admitting: Family Medicine

## 2018-04-15 DIAGNOSIS — F422 Mixed obsessional thoughts and acts: Secondary | ICD-10-CM | POA: Diagnosis not present

## 2018-04-15 DIAGNOSIS — F410 Panic disorder [episodic paroxysmal anxiety] without agoraphobia: Secondary | ICD-10-CM | POA: Diagnosis not present

## 2018-04-15 DIAGNOSIS — F902 Attention-deficit hyperactivity disorder, combined type: Secondary | ICD-10-CM | POA: Diagnosis not present

## 2018-04-17 DIAGNOSIS — R7301 Impaired fasting glucose: Secondary | ICD-10-CM | POA: Diagnosis not present

## 2018-04-17 DIAGNOSIS — R5382 Chronic fatigue, unspecified: Secondary | ICD-10-CM | POA: Diagnosis not present

## 2018-04-17 DIAGNOSIS — E785 Hyperlipidemia, unspecified: Secondary | ICD-10-CM | POA: Diagnosis not present

## 2018-04-17 DIAGNOSIS — E7800 Pure hypercholesterolemia, unspecified: Secondary | ICD-10-CM | POA: Diagnosis not present

## 2018-04-17 DIAGNOSIS — E559 Vitamin D deficiency, unspecified: Secondary | ICD-10-CM | POA: Diagnosis not present

## 2018-04-17 DIAGNOSIS — E039 Hypothyroidism, unspecified: Secondary | ICD-10-CM | POA: Diagnosis not present

## 2018-04-21 DIAGNOSIS — F902 Attention-deficit hyperactivity disorder, combined type: Secondary | ICD-10-CM | POA: Diagnosis not present

## 2018-04-21 DIAGNOSIS — F410 Panic disorder [episodic paroxysmal anxiety] without agoraphobia: Secondary | ICD-10-CM | POA: Diagnosis not present

## 2018-04-21 DIAGNOSIS — F422 Mixed obsessional thoughts and acts: Secondary | ICD-10-CM | POA: Diagnosis not present

## 2018-04-22 DIAGNOSIS — E7800 Pure hypercholesterolemia, unspecified: Secondary | ICD-10-CM | POA: Diagnosis not present

## 2018-04-22 DIAGNOSIS — R7301 Impaired fasting glucose: Secondary | ICD-10-CM | POA: Diagnosis not present

## 2018-04-22 DIAGNOSIS — E559 Vitamin D deficiency, unspecified: Secondary | ICD-10-CM | POA: Diagnosis not present

## 2018-04-22 DIAGNOSIS — R5382 Chronic fatigue, unspecified: Secondary | ICD-10-CM | POA: Diagnosis not present

## 2018-04-22 DIAGNOSIS — E039 Hypothyroidism, unspecified: Secondary | ICD-10-CM | POA: Diagnosis not present

## 2018-04-22 DIAGNOSIS — L659 Nonscarring hair loss, unspecified: Secondary | ICD-10-CM | POA: Diagnosis not present

## 2018-04-22 DIAGNOSIS — I10 Essential (primary) hypertension: Secondary | ICD-10-CM | POA: Diagnosis not present

## 2018-04-25 ENCOUNTER — Other Ambulatory Visit: Payer: Self-pay

## 2018-04-25 ENCOUNTER — Encounter: Payer: Self-pay | Admitting: Sports Medicine

## 2018-04-25 ENCOUNTER — Ambulatory Visit (INDEPENDENT_AMBULATORY_CARE_PROVIDER_SITE_OTHER): Payer: Medicare Other | Admitting: Sports Medicine

## 2018-04-25 ENCOUNTER — Ambulatory Visit (INDEPENDENT_AMBULATORY_CARE_PROVIDER_SITE_OTHER): Payer: Medicare Other

## 2018-04-25 DIAGNOSIS — J4531 Mild persistent asthma with (acute) exacerbation: Secondary | ICD-10-CM

## 2018-04-25 DIAGNOSIS — R0602 Shortness of breath: Secondary | ICD-10-CM | POA: Diagnosis not present

## 2018-04-25 MED ORDER — AZITHROMYCIN 250 MG PO TABS
ORAL_TABLET | ORAL | 0 refills | Status: DC
Start: 2018-04-25 — End: 2018-07-24

## 2018-04-25 MED ORDER — PREDNISONE 50 MG PO TABS
50.0000 mg | ORAL_TABLET | Freq: Every day | ORAL | 0 refills | Status: DC
Start: 2018-04-25 — End: 2018-07-24

## 2018-04-25 NOTE — Progress Notes (Signed)
. Subjective:    CC: Feeling sick  HPI: For 2 days this pleasant 73 year old female has had mild fatigue, mild myalgias, mild cough, wheeze.  Fevers up to 99 degrees, nothing up to 100.  No GI symptoms, skin rash, symptoms are moderate, persistent.  She does have a history of asthma historically controlled with Symbicort.  Symptoms are moderate, persistent.  I reviewed the past medical history, family history, social history, surgical history, and allergies today and no changes were needed.  Please see the problem list section below in epic for further details.  Past Medical History: Past Medical History:  Diagnosis Date  . Allergic rhinitis, cause unspecified   . Anxiety   . Benign paroxysmal positional vertigo   . Cancer ([place])    basal cell carcinoma per right side of nostril  . Depressive disorder, not elsewhere classified   . Diabetes mellitus without complication ([place])    noted per H&P with Dr Dianah Field 08/03/2014   . Esophageal reflux   . History of hiatal hernia   . Hypertension   . Localized osteoarthrosis not specified whether primary or secondary, lower leg   . Other and unspecified hyperlipidemia   . Other diseases of vocal cords   . Peptic ulcer, unspecified site, unspecified as acute or chronic, without mention of hemorrhage, perforation, or obstruction   . Pneumonia    bilat pneumonia 1987  . Shortness of breath dyspnea    with anxiety; with climbing stairs  . TMJ (temporomandibular joint disorder)   . Unspecified arthropathy, hand   . Unspecified asthma(493.90)    triggered with a virus   . Unspecified hypothyroidism    Past Surgical History: Past Surgical History:  Procedure Laterality Date  . BREAST BIOPSY  09/07/2011   High Risk Lesion  . BREAST EXCISIONAL BIOPSY Left   . BREAST SURGERY     LUMPECTOMY / LEFT 10/12/2011  . KNEE ARTHROSCOPY     bilat   . NASAL SINUS SURGERY    . ROTATOR CUFF REPAIR     right   . thumb surgery      left hand 45  years ago   . TOTAL HIP ARTHROPLASTY Right 08/10/2014   Procedure: RIGHT TOTAL HIP ARTHROPLASTY ANTERIOR APPROACH;  Surgeon: Paralee Cancel, MD;  Location: [facility];  Service: Orthopedics;  Laterality: Right;   Social History: Social History   Socioeconomic History  . Marital status: Married    Spouse name: Not on file  . Number of children: 3  . Years of education: Not on file  . Highest education level: Not on file  Occupational History  . Occupation: Retired  Scientific laboratory technician  . Financial resource strain: Not on file  . Food insecurity:    Worry: Not on file    Inability: Not on file  . Transportation needs:    Medical: Not on file    Non-medical: Not on file  Tobacco Use  . Smoking status: Former Smoker    Packs/day: 0.30    Years: 2.00    Pack years: 0.60    Types: Cigarettes    Last attempt to quit: 02/12/1958    Years since quitting: 60.2  . Smokeless tobacco: Never Used  Substance and Sexual Activity  . Alcohol use: Yes    Comment: wine socially  . Drug use: No  . Sexual activity: Yes  Lifestyle  . Physical activity:    Days per week: Not on file    Minutes per session: Not on file  .  Stress: Not on file  Relationships  . Social connections:    Talks on phone: Not on file    Gets together: Not on file    Attends religious service: Not on file    Active member of club or organization: Not on file    Attends meetings of clubs or organizations: Not on file    Relationship status: Not on file  Other Topics Concern  . Not on file  Social History Narrative  . Not on file   Family History: Family History  Problem Relation Age of Onset  . Heart disease Father   . Colon cancer Mother   . Liver cancer Brother   . Asthma Brother    Allergies: Allergies  Allergen Reactions  . Astelin [Azelastine Hcl] Other (See Comments)    Headache  . Lisinopril     ACE cough  . Morphine     REACTION: Nausea  . Moxifloxacin     REACTION: confusion, dizziness, paranoia    Medications: See med rec.  Review of Systems: No fevers, chills, night sweats, weight loss, chest pain, or shortness of breath.   Objective:    General: Well Developed, well nourished, and in no acute distress.  Neuro: Alert and oriented x3, extra-ocular muscles intact, sensation grossly intact.  HEENT: Normocephalic, atraumatic, pupils equal round reactive to light, neck supple, no masses, no lymphadenopathy, thyroid nonpalpable.  Oropharynx, nasopharynx, ear canals unremarkable. Skin: Warm and dry, no rashes. Cardiac: Regular rate and rhythm, no murmurs rubs or gallops, no lower extremity edema.  Respiratory: Coarse sounds with inspiratory and expiratory wheezes, worse to the left upper lobe. Not using accessory muscles, speaking in full sentences.  Rapid flu test is negative.  Impression and Recommendations:    Asthma with allergic rhinitis Asthma with acute exacerbation. Prednisone, chest x-ray, azithromycin. Flu swab is negative. Return to see [place] if no better in a week or 2.   ___________________________________________ Gwen Her. Dianah Field, M.D., ABFM., CAQSM. Primary Care and Sports Medicine [facility]  Adjunct Professor of [employer] [facility] [employer]

## 2018-04-25 NOTE — Assessment & Plan Note (Signed)
Asthma with acute exacerbation. Prednisone, chest x-ray, azithromycin. Flu swab is negative. Return to see [place] if no better in a week or 2.

## 2018-05-19 DIAGNOSIS — F410 Panic disorder [episodic paroxysmal anxiety] without agoraphobia: Secondary | ICD-10-CM | POA: Diagnosis not present

## 2018-05-19 DIAGNOSIS — F902 Attention-deficit hyperactivity disorder, combined type: Secondary | ICD-10-CM | POA: Diagnosis not present

## 2018-05-19 DIAGNOSIS — F422 Mixed obsessional thoughts and acts: Secondary | ICD-10-CM | POA: Diagnosis not present

## 2018-05-20 DIAGNOSIS — F902 Attention-deficit hyperactivity disorder, combined type: Secondary | ICD-10-CM | POA: Diagnosis not present

## 2018-05-20 DIAGNOSIS — F410 Panic disorder [episodic paroxysmal anxiety] without agoraphobia: Secondary | ICD-10-CM | POA: Diagnosis not present

## 2018-05-20 DIAGNOSIS — F422 Mixed obsessional thoughts and acts: Secondary | ICD-10-CM | POA: Diagnosis not present

## 2018-06-09 DIAGNOSIS — F902 Attention-deficit hyperactivity disorder, combined type: Secondary | ICD-10-CM | POA: Diagnosis not present

## 2018-06-09 DIAGNOSIS — F410 Panic disorder [episodic paroxysmal anxiety] without agoraphobia: Secondary | ICD-10-CM | POA: Diagnosis not present

## 2018-06-09 DIAGNOSIS — F422 Mixed obsessional thoughts and acts: Secondary | ICD-10-CM | POA: Diagnosis not present

## 2018-06-10 ENCOUNTER — Telehealth: Payer: Self-pay | Admitting: Sports Medicine

## 2018-06-10 NOTE — Telephone Encounter (Signed)
Pt called clinic to see if she could have labs for check for COVID-19 antibodies. States when she was treated earlier in March she was negative for pneumonia, and questions now if it was COVID-19. Routing.

## 2018-06-10 NOTE — Telephone Encounter (Signed)
Routing to PCP to address this.

## 2018-06-10 NOTE — Telephone Encounter (Signed)
Please let her know that we do not know the accuracy of these tests.  In fact they are not currently FDA approved so they do not necessarily have reliability.  Plus if she has had coronavirus in the past which can just cause a cold it can cause a positive test and cause her fall since a security that she may have actually had COVID-19.  We can certainly order the test if she would like but I would not recommend it at this time.  If she feels strongly about it and okay to order COVID-19 serology test through [facility].

## 2018-06-11 NOTE — Telephone Encounter (Signed)
Pt advised. She is OK with not getting the lab test. Nothing further required at this time.

## 2018-06-17 DIAGNOSIS — F410 Panic disorder [episodic paroxysmal anxiety] without agoraphobia: Secondary | ICD-10-CM | POA: Diagnosis not present

## 2018-06-17 DIAGNOSIS — F422 Mixed obsessional thoughts and acts: Secondary | ICD-10-CM | POA: Diagnosis not present

## 2018-06-17 DIAGNOSIS — F902 Attention-deficit hyperactivity disorder, combined type: Secondary | ICD-10-CM | POA: Diagnosis not present

## 2018-06-30 DIAGNOSIS — F902 Attention-deficit hyperactivity disorder, combined type: Secondary | ICD-10-CM | POA: Diagnosis not present

## 2018-06-30 DIAGNOSIS — F422 Mixed obsessional thoughts and acts: Secondary | ICD-10-CM | POA: Diagnosis not present

## 2018-06-30 DIAGNOSIS — F410 Panic disorder [episodic paroxysmal anxiety] without agoraphobia: Secondary | ICD-10-CM | POA: Diagnosis not present

## 2018-07-03 DIAGNOSIS — R05 Cough: Secondary | ICD-10-CM | POA: Diagnosis not present

## 2018-07-05 DIAGNOSIS — Z09 Encounter for follow-up examination after completed treatment for conditions other than malignant neoplasm: Secondary | ICD-10-CM | POA: Diagnosis not present

## 2018-07-05 DIAGNOSIS — R509 Fever, unspecified: Secondary | ICD-10-CM | POA: Diagnosis not present

## 2018-07-05 DIAGNOSIS — R5383 Other fatigue: Secondary | ICD-10-CM | POA: Diagnosis not present

## 2018-07-05 DIAGNOSIS — R05 Cough: Secondary | ICD-10-CM | POA: Diagnosis not present

## 2018-07-05 DIAGNOSIS — R062 Wheezing: Secondary | ICD-10-CM | POA: Diagnosis not present

## 2018-07-08 ENCOUNTER — Other Ambulatory Visit: Payer: Self-pay | Admitting: Adult Health

## 2018-07-08 NOTE — Telephone Encounter (Signed)
Is this appropriate for refill ? 

## 2018-07-09 DIAGNOSIS — M25562 Pain in left knee: Secondary | ICD-10-CM | POA: Diagnosis not present

## 2018-07-09 DIAGNOSIS — M1712 Unilateral primary osteoarthritis, left knee: Secondary | ICD-10-CM | POA: Diagnosis not present

## 2018-07-15 DIAGNOSIS — F902 Attention-deficit hyperactivity disorder, combined type: Secondary | ICD-10-CM | POA: Diagnosis not present

## 2018-07-15 DIAGNOSIS — F410 Panic disorder [episodic paroxysmal anxiety] without agoraphobia: Secondary | ICD-10-CM | POA: Diagnosis not present

## 2018-07-15 DIAGNOSIS — F422 Mixed obsessional thoughts and acts: Secondary | ICD-10-CM | POA: Diagnosis not present

## 2018-07-22 ENCOUNTER — Telehealth: Payer: Self-pay | Admitting: *Deleted

## 2018-07-22 NOTE — Telephone Encounter (Signed)
   [facility] Pre-operative Risk Assessment    Request for surgical clearance:  1. What type of surgery is being performed? LTK ARTHROPLASTY  2. When is this surgery scheduled? 08/26/2018  3. What type of clearance is required (medical clearance vs. Pharmacy clearance to hold med vs. Both)? BOTH  4. Are there any medications that need to be held prior to surgery and how long? PENDING RECOMMENDATION  5. Practice name and name of physician performing surgery? [facility], DR. Yalobusha  6. What is your office phone number [phone]   7.   What is your office fax number [phone], [place]  8.   Anesthesia type (None, local, MAC, general) ? SPINAL   Shian Goodnow 07/22/2018, 6:36 PM  _________________________________________________________________   (provider comments below)

## 2018-07-23 ENCOUNTER — Telehealth: Payer: Self-pay

## 2018-07-23 NOTE — Telephone Encounter (Signed)

## 2018-07-23 NOTE — Telephone Encounter (Signed)
Pt has appt with Vin Bhagat on 07/24/18 at 3:15 pm. Pt verbalized understanding. Pt has been screened with covid-19 questions and is aware to arrive 15 minutes prior to appt and to wear a mask. Pt thanked me for the call. Pre-op clearance notes have been faxed over to requesting provider

## 2018-07-23 NOTE — Telephone Encounter (Signed)
   Primary Cardiologist:James Hochrein, MD  Chart reviewed as part of pre-operative protocol coverage. Because of Karington Zarazua past medical history and time since last visit, he/she will require a follow-up visit in order to better assess preoperative cardiovascular risk.  Pre-op covering staff: - Please schedule appointment and call patient to inform them. - Please contact requesting surgeon's office via preferred method (i.e, phone, fax) to inform them of need for appointment prior to surgery.  If applicable, this message will also be routed to pharmacy pool and/or primary cardiologist for input on holding anticoagulant/antiplatelet agent as requested below so that this information is available at time of patient's appointment.   Lyda Jester, PA-C  07/23/2018, 8:39 AM

## 2018-07-24 ENCOUNTER — Telehealth: Payer: Self-pay | Admitting: Family Medicine

## 2018-07-24 ENCOUNTER — Other Ambulatory Visit: Payer: Self-pay

## 2018-07-24 ENCOUNTER — Ambulatory Visit (INDEPENDENT_AMBULATORY_CARE_PROVIDER_SITE_OTHER): Payer: Medicare Other | Admitting: Physician Assistant

## 2018-07-24 ENCOUNTER — Encounter: Payer: Self-pay | Admitting: Physician Assistant

## 2018-07-24 VITALS — BP 126/72 | HR 86 | Ht 62.5 in | Wt 188.4 lb

## 2018-07-24 DIAGNOSIS — R0602 Shortness of breath: Secondary | ICD-10-CM

## 2018-07-24 DIAGNOSIS — Z01810 Encounter for preprocedural cardiovascular examination: Secondary | ICD-10-CM

## 2018-07-24 DIAGNOSIS — F410 Panic disorder [episodic paroxysmal anxiety] without agoraphobia: Secondary | ICD-10-CM | POA: Diagnosis not present

## 2018-07-24 DIAGNOSIS — R079 Chest pain, unspecified: Secondary | ICD-10-CM | POA: Diagnosis not present

## 2018-07-24 DIAGNOSIS — I10 Essential (primary) hypertension: Secondary | ICD-10-CM | POA: Diagnosis not present

## 2018-07-24 DIAGNOSIS — F422 Mixed obsessional thoughts and acts: Secondary | ICD-10-CM | POA: Diagnosis not present

## 2018-07-24 DIAGNOSIS — F902 Attention-deficit hyperactivity disorder, combined type: Secondary | ICD-10-CM | POA: Diagnosis not present

## 2018-07-24 NOTE — Telephone Encounter (Signed)
I checked my basket and I do have the preoperative risk evaluation.  They are recommending that we do a general physical with EKG and labs.  Please have her schedule that with me at her convenience.

## 2018-07-24 NOTE — Telephone Encounter (Signed)
Patient calling in wanting to make sure we have the paperwork for her surgical clearance from Pietro Cassis. Alvan Dame, MD - [facility]. If there is any questions or concerns please contact their office at [phone] or the patient.

## 2018-07-24 NOTE — Telephone Encounter (Signed)
Appointment has been made for 06/22. Information below was given as well. No further questions at this time.

## 2018-07-24 NOTE — Patient Instructions (Signed)
Medication Instructions:  Your physician recommends that you continue on your current medications as directed. Please refer to the Current Medication list given to you today.  If you need a refill on your cardiac medications before your next appointment, please call your pharmacy.   Lab work: None ordered  If you have labs (blood work) drawn today and your tests are completely normal, you will receive your results only by: Marland Kitchen MyChart Message (if you have MyChart) OR . A paper copy in the mail If you have any lab test that is abnormal or we need to change your treatment, we will call you to review the results.  Testing/Procedures: None ordered  Follow-Up: At [facility], you and your health needs are our priority.  As part of our continuing mission to provide you with exceptional heart care, we have created designated Provider Care Teams.  These Care Teams include your primary Cardiologist (physician) and Advanced Practice Providers (APPs -  Physician Assistants and Nurse Practitioners) who all work together to provide you with the care you need, when you need it. . FOLLOW UP WITH DR. HOCHREIN AS PLANNED  Any Other Special Instructions Will Be Listed Below (If Applicable). You have been cleared for your upcoming procedure.  We will fax the requested paperwork to the surgeon's office.

## 2018-07-24 NOTE — Progress Notes (Signed)
Cardiology Office Note    Date:  07/24/2018   ID:  [name], DOB 16-May-1945, MRN [id number]  PCP:  Hali Marry, MD  Cardiologist:  Dr. Percival Spanish   Chief Complaint: Surgical clearance for LTK arthroplasty   History of Present Illness:   [name] is a 73 y.o. female seen for surgical clearance. Hx of DM, HTN and depression.   Followed by Dr. Percival Spanish for chest pain dyspnea. Unremarkable echo in 2015. There is no evidence of ischemia on stress perfusion study in 2017. Negative adequate POET (Plain Old Exercise Treadmill) 09/2017.  Here today for surgical clearance. The patient denies nausea, vomiting, fever, chest pain, palpitations, shortness of breath, orthopnea, PND, dizziness, syncope, cough, congestion, abdominal pain, hematochezia, melena, lower extremity edema. No COVID symptoms. Uses stationary bike occasionally. Does walking yard work intermittently.  Anxious at baseline.   Past Medical History:  Diagnosis Date  . Allergic rhinitis, cause unspecified   . Anxiety   . Benign paroxysmal positional vertigo   . Cancer ([place])    basal cell carcinoma per right side of nostril  . Depressive disorder, not elsewhere classified   . Diabetes mellitus without complication ([place])    noted per H&P with Dr Dianah Field 08/03/2014   . Esophageal reflux   . History of hiatal hernia   . Hypertension   . Localized osteoarthrosis not specified whether primary or secondary, lower leg   . Other and unspecified hyperlipidemia   . Other diseases of vocal cords   . Peptic ulcer, unspecified site, unspecified as acute or chronic, without mention of hemorrhage, perforation, or obstruction   . Pneumonia    bilat pneumonia 1987  . Shortness of breath dyspnea    with anxiety; with climbing stairs  . TMJ (temporomandibular joint disorder)   . Unspecified arthropathy, hand   . Unspecified asthma(493.90)    triggered with a virus   . Unspecified hypothyroidism     Past  Surgical History:  Procedure Laterality Date  . BREAST BIOPSY  09/07/2011   High Risk Lesion  . BREAST EXCISIONAL BIOPSY Left   . BREAST SURGERY     LUMPECTOMY / LEFT 10/12/2011  . KNEE ARTHROSCOPY     bilat   . NASAL SINUS SURGERY    . ROTATOR CUFF REPAIR     right   . thumb surgery      left hand 45 years ago   . TOTAL HIP ARTHROPLASTY Right 08/10/2014   Procedure: RIGHT TOTAL HIP ARTHROPLASTY ANTERIOR APPROACH;  Surgeon: Paralee Cancel, MD;  Location: [facility];  Service: Orthopedics;  Laterality: Right;    Current Medications: Prior to Admission medications   Medication Sig Start Date End Date Taking? Authorizing Provider  albuterol (PROVENTIL HFA) 108 (90 Base) MCG/ACT inhaler Inhale 2 puffs into the lungs every 6 (six) hours as needed for shortness of breath. 05/20/17   Parrett, Tammy S, NP  AMBULATORY NON FORMULARY MEDICATION APPLY 0 5GM TO AFFECTED PAINFUL AREAS UP TO 3 TIMES A DAY  0 5GM   1 PUMP C-AUTH SUB W/KETAMINE 05/27/17   [provider]  AMBULATORY NON FORMULARY MEDICATION APPLY 0.5GM TO AFFECTED PAINFUL AREAS UP TO 3 TIMES A DAY. 0.5GM = 1 PUMP C-AUTH SUB W/KETAMINE 05/27/17   [provider]  atorvastatin (LIPITOR) 10 MG tablet Take 10 mg by mouth daily. 08/05/17   [provider]  azithromycin (ZITHROMAX Z-PAK) 250 MG tablet Take 2 tablets (500 mg) on  Day 1,  followed by 1 tablet (  250 mg) once daily on Days 2 through 5. 04/25/18   Silverio Decamp, MD  benzonatate (TESSALON) 200 MG capsule TAKE 1 CAPSULE BY MOUTH EVERY DAY 3 TIMES A DAY AS NEEDED FOR COUGH 07/09/18   Parrett, Fonnie Mu, NP  Calcium Carbonate-Vit D-Min 600-400 MG-UNIT TABS Take 1 tablet by mouth 2 (two) times daily.     [provider]  clindamycin (CLEOCIN T) 1 % lotion Apply 1 application topically 2 (two) times daily as needed (bumps). 06/08/16   Hali Marry, MD  famotidine (PEPCID) 10 MG tablet Take 10 mg by mouth at bedtime.    [provider]   fexofenadine (ALLEGRA) 180 MG tablet Take 180 mg by mouth at bedtime.    [provider]  fluticasone (FLONASE) 50 MCG/ACT nasal spray USE 2 SPRAYS IN EACH NOSTRIL EVERY DAY Patient taking differently: Place 2 sprays into both nostrils as needed.  12/03/16   Parrett, Fonnie Mu, NP  folic acid (FOLVITE) 1 MG tablet Take 1 mg by mouth 2 (two) times daily.      [provider]  hydrochlorothiazide (HYDRODIURIL) 25 MG tablet TAKE 1 TABLET BY MOUTH EVERY DAY 04/11/18   Hali Marry, MD  levothyroxine (SYNTHROID, LEVOTHROID) 75 MCG tablet Take 75 mcg by mouth daily. Except Sunday 88 mcg    [provider]  levothyroxine (SYNTHROID, LEVOTHROID) 88 MCG tablet Take only sunday     [provider]  losartan (COZAAR) 50 MG tablet TAKE 1 TABLET BY MOUTH EVERY DAY 10/21/17   Hali Marry, MD  meloxicam (MOBIC) 15 MG tablet TAKE 1 TABLET BY MOUTH EVERY MORNING 03/21/18   Hali Marry, MD  metroNIDAZOLE (METROGEL) 1 % gel APPLY EVERY DAY Patient taking differently: as needed.  10/04/15   Hali Marry, MD  minoxidil (ROGAINE) 2 % external solution Apply 1 application topically 2 (two) times daily.    [provider]  ondansetron (ZOFRAN) 4 MG tablet Take 1 tablet (4 mg total) by mouth every 8 (eight) hours as needed for nausea or vomiting. 04/15/17   Hali Marry, MD  oseltamivir (TAMIFLU) 75 MG capsule Take 1 capsule (75 mg total) by mouth daily. 03/12/18   Hali Marry, MD  pantoprazole (PROTONIX) 40 MG tablet Take 1 tablet (40 mg total) by mouth daily. 01/14/18   Hali Marry, MD  PARoxetine (PAXIL-CR) 12.5 MG 24 hr tablet Take 12.5 mg by mouth 2 (two) times daily.     [provider]  predniSONE (DELTASONE) 50 MG tablet Take 1 tablet (50 mg total) by mouth daily. 04/25/18   Silverio Decamp, MD  Saline (SIMPLY SALINE) 0.9 % AERS Place 1 spray into the nose 2 (two) times daily as needed (dry).     [provider]  SYMBICORT 80-4.5 MCG/ACT inhaler INHALE 2 PUFFS TWICE A DAY Patient taking differently: 1 puff.  08/12/17   Rigoberto Noel, MD    Allergies:   Debbe Odea hcl], Lisinopril, Morphine, and Moxifloxacin   Social History   Socioeconomic History  . Marital status: Married    Spouse name: Not on file  . Number of children: 3  . Years of education: Not on file  . Highest education level: Not on file  Occupational History  . Occupation: Retired  Scientific laboratory technician  . Financial resource strain: Not on file  . Food insecurity    Worry: Not on file    Inability: Not on file  . Transportation needs  Medical: Not on file    Non-medical: Not on file  Tobacco Use  . Smoking status: Former Smoker    Packs/day: 0.30    Years: 2.00    Pack years: 0.60    Types: Cigarettes    Quit date: 02/12/1958    Years since quitting: 60.4  . Smokeless tobacco: Never Used  Substance and Sexual Activity  . Alcohol use: Yes    Comment: wine socially  . Drug use: No  . Sexual activity: Yes  Lifestyle  . Physical activity    Days per week: Not on file    Minutes per session: Not on file  . Stress: Not on file  Relationships  . Social Herbalist on phone: Not on file    Gets together: Not on file    Attends religious service: Not on file    Active member of club or organization: Not on file    Attends meetings of clubs or organizations: Not on file    Relationship status: Not on file  Other Topics Concern  . Not on file  Social History Narrative  . Not on file     Family History:  The patient's family history includes Asthma in her brother; Colon cancer in her mother; Heart disease in her father; Liver cancer in her brother.  ROS:   Please see the history of present illness.    ROS All other systems reviewed and are negative.   PHYSICAL EXAM:   VS:  BP 126/72   Pulse 86   Ht 5' 2.5" (1.588 m)   Wt 188 lb 6.4 oz (85.5 kg)   SpO2 96%   BMI 33.91  kg/m    GEN: Well nourished, well developed, in no acute distress  HEENT: normal  Neck: no JVD, carotid bruits, or masses Cardiac:RRR; no murmurs, rubs, or gallops,no edema  Respiratory:  clear to auscultation bilaterally, normal work of breathing GI: soft, nontender, nondistended, + BS MS: no deformity or atrophy  Skin: warm and dry, no rash Neuro:  Alert and Oriented x 3, Strength and sensation are intact Psych: euthymic mood, full affect  Wt Readings from Last 3 Encounters:  07/24/18 188 lb 6.4 oz (85.5 kg)  04/25/18 189 lb (85.7 kg)  02/17/18 185 lb (83.9 kg)      Studies/Labs Reviewed:   EKG:  EKG is ordered today.  The ekg ordered today demonstrates NSR at rate of 84 bpm, no acute changes   Recent Labs: 09/26/2017: BNP 21.0 12/04/2017: ALT 23; BUN 20; Creatinine 0.7; Potassium 4.5; Sodium 139   Lipid Panel    Component Value Date/Time   CHOL 201 (A) 02/13/2015   TRIG 118 02/13/2015   HDL 58 02/13/2015   CHOLHDL 3.6 CALC 09/18/2006 1225   VLDL 16 09/18/2006 1225   LDLCALC 119 02/13/2015   LDLDIRECT 146.7 09/18/2006 1225    Additional studies/ records that were reviewed today include:   As summarized above  ASSESSMENT & PLAN:    1. HTN - BP stable at current medications.   2. Surgical clearance -  Given past medical history and time since last visit, based on ACC/AHA guidelines, [name] would be at acceptable risk for the planned procedure without further cardiovascular testing.   I will route this recommendation to the requesting party via Epic fax function and remove from pre-op pool.  Please call with questions.  [place], [place] 07/24/2018, 4:01 PM       Medication Adjustments/Labs and Tests  Ordered: Current medicines are reviewed at length with the patient today.  Concerns regarding medicines are outlined above.  Medication changes, Labs and Tests ordered today are listed in the Patient Instructions below. There are no Patient  Instructions on file for this visit.   Jarrett Soho, [place]  07/24/2018 4:01 PM    [facility] [address], [place], [place]  [address] Phone: [phone]; Fax: [phone]

## 2018-07-28 DIAGNOSIS — M542 Cervicalgia: Secondary | ICD-10-CM | POA: Diagnosis not present

## 2018-07-31 DIAGNOSIS — H43813 Vitreous degeneration, bilateral: Secondary | ICD-10-CM | POA: Diagnosis not present

## 2018-07-31 DIAGNOSIS — H524 Presbyopia: Secondary | ICD-10-CM | POA: Diagnosis not present

## 2018-07-31 DIAGNOSIS — H25813 Combined forms of age-related cataract, bilateral: Secondary | ICD-10-CM | POA: Diagnosis not present

## 2018-07-31 DIAGNOSIS — M25562 Pain in left knee: Secondary | ICD-10-CM | POA: Diagnosis not present

## 2018-07-31 DIAGNOSIS — H04123 Dry eye syndrome of bilateral lacrimal glands: Secondary | ICD-10-CM | POA: Diagnosis not present

## 2018-08-04 ENCOUNTER — Ambulatory Visit (INDEPENDENT_AMBULATORY_CARE_PROVIDER_SITE_OTHER): Payer: Medicare Other | Admitting: Family Medicine

## 2018-08-04 ENCOUNTER — Encounter: Payer: Self-pay | Admitting: Family Medicine

## 2018-08-04 ENCOUNTER — Telehealth: Payer: Self-pay | Admitting: *Deleted

## 2018-08-04 ENCOUNTER — Telehealth: Payer: Self-pay | Admitting: Family Medicine

## 2018-08-04 VITALS — BP 135/70 | HR 89 | Ht 67.0 in | Wt 188.0 lb

## 2018-08-04 DIAGNOSIS — E038 Other specified hypothyroidism: Secondary | ICD-10-CM | POA: Diagnosis not present

## 2018-08-04 DIAGNOSIS — R7301 Impaired fasting glucose: Secondary | ICD-10-CM

## 2018-08-04 DIAGNOSIS — I10 Essential (primary) hypertension: Secondary | ICD-10-CM

## 2018-08-04 DIAGNOSIS — Z01818 Encounter for other preprocedural examination: Secondary | ICD-10-CM

## 2018-08-04 LAB — POCT GLYCOSYLATED HEMOGLOBIN (HGB A1C): Hemoglobin A1C: 6.3 % — AB (ref 4.0–5.6)

## 2018-08-04 NOTE — H&P (Signed)
TOTAL KNEE ADMISSION H&P  Patient is being admitted for left total knee arthroplasty.  Subjective:  Chief Complaint:  Left knee primary OA / pain  HPI: [name], 73 y.o. female, has a history of pain and functional disability in the left knee due to arthritis and has failed non-surgical conservative treatments for greater than 12 weeks to include NSAID's and/or analgesics, corticosteriod injections and activity modification.  Onset of symptoms was gradual, starting 1-2 years ago with gradually worsening course since that time. The patient noted prior procedures on the knee to include  arthroscopy on the left knee(s).  Patient currently rates pain in the left knee(s) at 5 out of 10 with activity. Patient has worsening of pain with activity and weight bearing, pain that interferes with activities of daily living, pain with passive range of motion, crepitus and joint swelling.  Patient has evidence of periarticular osteophytes and joint space narrowing by imaging studies. There is no active infection.  Risks, benefits and expectations were discussed with the patient.  Risks including but not limited to the risk of anesthesia, blood clots, nerve damage, blood vessel damage, failure of the prosthesis, infection and up to and including death.  Patient understand the risks, benefits and expectations and wishes to proceed with surgery.   PCP: Hali Marry, MD  D/C Plans:       Home  Post-op Meds:       No Rx given  Tranexamic Acid:      To be given - IV   Decadron:      Is to be given  FYI:      ASA  Oxy (used at previous surgeries)  DME:   Pt already has equipment   PT:   OPPT   Pharmacy: [facility] [place].    Patient Active Problem List   Diagnosis Date Noted  . Aortic atherosclerosis ([place]) 09/26/2017  . Fatty liver 11/09/2016  . Vitamin D deficiency 06/19/2016  . Chronic fatigue 06/19/2016  . Alopecia 06/19/2016  . Tendon nodule 01/16/2016  . Venous stasis ulcer of  left lower extremity ([place]) 03/03/2015  . S/P right THA, AA 08/10/2014  . Primary osteoarthritis of right hip 05/27/2014  . Essential hypertension, benign 05/26/2014  . IFG (impaired fasting glucose) 01/25/2014  . Dyspnea 06/26/2013  . Vitreous detachment 09/23/2012  . Oral herpes 06/23/2012  . OCD (obsessive compulsive disorder) 05/09/2012  . Atypical ductal hyperplasia of breast 10/29/2011  . Abnormal mammogram with microcalcification 10/04/2011  . Obesity (BMI 30-39.9) 05/03/2011  . CARRIER/SUSPECTED CARRIER GROUP B STREPTOCOCCUS 11/17/2010  . Abnormal liver enzymes 10/25/2010  . SOMNOLENCE 11/25/2009  . Rheumatoid arthritis with rheumatoid factor ([place]) 11/30/2008  . POSTMENOPAUSAL STATUS 11/30/2008  . ARTHRITIS, HANDS, BILATERAL 12/22/2007  . LOC OSTEOARTHROS NOT SPEC PRIM/SEC LOWER LEG 03/19/2007  . Allergic rhinitis 11/05/2006  . VOCAL CORD DISORDER 11/05/2006  . Asthma with allergic rhinitis 11/05/2006  . Hypothyroidism 09/24/2006  . DEPRESSION 09/24/2006  . VERTIGO, BENIGN PAROXYSMAL POSITION 09/24/2006  . GERD 09/24/2006  . HYPERLIPIDEMIA 09/23/2006   Past Medical History:  Diagnosis Date  . Allergic rhinitis, cause unspecified   . Anxiety   . Benign paroxysmal positional vertigo   . Cancer ([place])    basal cell carcinoma per right side of nostril  . Depressive disorder, not elsewhere classified   . Diabetes mellitus without complication ([place])    noted per H&P with Dr Dianah Field 08/03/2014   . Esophageal reflux   . History of hiatal hernia   .  Hypertension   . Localized osteoarthrosis not specified whether primary or secondary, lower leg   . Other and unspecified hyperlipidemia   . Other diseases of vocal cords   . Peptic ulcer, unspecified site, unspecified as acute or chronic, without mention of hemorrhage, perforation, or obstruction   . Pneumonia    bilat pneumonia 1987  . Shortness of breath dyspnea    with anxiety; with climbing stairs  . TMJ  (temporomandibular joint disorder)   . Unspecified arthropathy, hand   . Unspecified asthma(493.90)    triggered with a virus   . Unspecified hypothyroidism     Past Surgical History:  Procedure Laterality Date  . BREAST BIOPSY  09/07/2011   High Risk Lesion  . BREAST EXCISIONAL BIOPSY Left   . BREAST SURGERY     LUMPECTOMY / LEFT 10/12/2011  . KNEE ARTHROSCOPY     bilat   . NASAL SINUS SURGERY    . ROTATOR CUFF REPAIR     right   . thumb surgery      left hand 45 years ago   . TOTAL HIP ARTHROPLASTY Right 08/10/2014   Procedure: RIGHT TOTAL HIP ARTHROPLASTY ANTERIOR APPROACH;  Surgeon: Paralee Cancel, MD;  Location: [facility];  Service: Orthopedics;  Laterality: Right;    No current facility-administered medications for this encounter.    Current Outpatient Medications  Medication Sig Dispense Refill Last Dose  . albuterol (PROVENTIL HFA) 108 (90 Base) MCG/ACT inhaler Inhale 2 puffs into the lungs every 6 (six) hours as needed for shortness of breath. 6.7 g 5 Taking  . AMBULATORY NON FORMULARY MEDICATION APPLY 0 5GM TO AFFECTED PAINFUL AREAS UP TO 3 TIMES A DAY  0 5GM   1 PUMP C-AUTH SUB W/KETAMINE  3 Taking  . Ascorbic Acid (VITAMIN C) 1000 MG tablet Take 1 tablet once a day   Taking  . atorvastatin (LIPITOR) 10 MG tablet Take 10 mg by mouth daily.  3 Taking  . benzonatate (TESSALON) 200 MG capsule TAKE 1 CAPSULE BY MOUTH EVERY DAY 3 TIMES A DAY AS NEEDED FOR COUGH 45 capsule 3 Taking  . buPROPion (WELLBUTRIN XL) 150 MG 24 hr tablet Take by mouth.   Taking  . Calcium Carbonate-Vit D-Min 600-400 MG-UNIT TABS Take 1 tablet by mouth 2 (two) times daily.    Taking  . Cholecalciferol (VITAMIN D3) 50 MCG (2000 UT) capsule Take 1 capsule once a day   Taking  . clindamycin (CLEOCIN T) 1 % lotion Apply 1 application topically 2 (two) times daily as needed (bumps). 60 mL 1 Taking  . clonazePAM (KLONOPIN) 0.5 MG tablet clonazepam 0.5 mg tablet   Taking  . famotidine (PEPCID) 10 MG tablet Take 10  mg by mouth at bedtime.   Taking  . fexofenadine (ALLEGRA) 180 MG tablet Take 180 mg by mouth at bedtime.   Taking  . Fexofenadine HCl (ALLEGRA ALLERGY PO) Allegra Allergy   Taking  . fluticasone (FLONASE) 50 MCG/ACT nasal spray Place 1 spray into both nostrils daily as needed for allergies or rhinitis.   Taking  . folic acid (FOLVITE) 1 MG tablet Take 1 mg by mouth 2 (two) times daily.     Taking  . Ginkgo Biloba 40 MG TABS Take as directed as needed   Taking  . Guaifenesin 1200 MG TB12 Take 1 tablet twice a day as needed   Taking  . hydrochlorothiazide (HYDRODIURIL) 25 MG tablet TAKE 1 TABLET BY MOUTH EVERY DAY 90 tablet 2 Taking  .  L-Lysine HCl 500 MG TABS Take by mouth.   Taking  . levothyroxine (SYNTHROID, LEVOTHROID) 75 MCG tablet Take 75 mcg by mouth daily. Except Sunday 88 mcg   Taking  . levothyroxine (SYNTHROID, LEVOTHROID) 88 MCG tablet Take only sunday    Taking  . losartan (COZAAR) 50 MG tablet TAKE 1 TABLET BY MOUTH EVERY DAY 90 tablet 3 Taking  . meloxicam (MOBIC) 15 MG tablet TAKE 1 TABLET BY MOUTH EVERY MORNING 90 tablet 1 Taking  . metroNIDAZOLE (METROGEL) 1 % gel APPLY EVERY DAY (Patient taking differently: as needed. ) 60 g 1 Taking  . minoxidil (ROGAINE) 2 % external solution Apply 1 application topically 2 (two) times daily.   Taking  . ondansetron (ZOFRAN) 4 MG tablet Take 1 tablet (4 mg total) by mouth every 8 (eight) hours as needed for nausea or vomiting. 12 tablet 0 Taking  . pantoprazole (PROTONIX) 40 MG tablet Take 1 tablet (40 mg total) by mouth daily. 90 tablet 3 Taking  . PARoxetine (PAXIL-CR) 12.5 MG 24 hr tablet Take 12.5 mg by mouth 2 (two) times daily.    Taking  . Saline (SIMPLY SALINE) 0.9 % AERS Place 1 spray into the nose 2 (two) times daily as needed (dry).   Taking  . SYMBICORT 80-4.5 MCG/ACT inhaler INHALE 2 PUFFS TWICE A DAY (Patient taking differently: 1 puff. ) 30.6 Inhaler 3 Taking   Allergies  Allergen Reactions  . Astelin [Azelastine Hcl] Other  (See Comments)    Headache  . Lisinopril     ACE cough  . Morphine     REACTION: Nausea  . Moxifloxacin     REACTION: confusion, dizziness, paranoia    Social History   Tobacco Use  . Smoking status: Former Smoker    Packs/day: 0.30    Years: 2.00    Pack years: 0.60    Types: Cigarettes    Quit date: 02/12/1958    Years since quitting: 60.5  . Smokeless tobacco: Never Used  Substance Use Topics  . Alcohol use: Yes    Comment: wine socially    Family History  Problem Relation Age of Onset  . Heart disease Father   . Colon cancer Mother   . Liver cancer Brother   . Asthma Brother      Review of Systems  Constitutional: Positive for malaise/fatigue.  HENT: Negative.   Eyes: Negative.   Respiratory: Negative.   Cardiovascular: Negative.   Gastrointestinal: Positive for heartburn.  Genitourinary: Negative.   Musculoskeletal: Positive for joint pain and neck pain.  Skin: Negative.   Neurological: Negative.   Endo/Heme/Allergies: Positive for environmental allergies.  Psychiatric/Behavioral: Positive for depression. The patient is nervous/anxious.     Objective:  Physical Exam  Constitutional: She is oriented to person, place, and time. She appears well-developed.  HENT:  Head: Normocephalic.  Eyes: Pupils are equal, round, and reactive to light.  Neck: Neck supple. No JVD present. No tracheal deviation present. No thyromegaly present.  Cardiovascular: Normal rate, regular rhythm and intact distal pulses.  Respiratory: Effort normal and breath sounds normal. No respiratory distress. She has no wheezes.  GI: Soft. There is no abdominal tenderness. There is no guarding.  Musculoskeletal:     Left knee: She exhibits decreased range of motion, swelling and bony tenderness. She exhibits no ecchymosis, no deformity, no laceration and no erythema. Tenderness found.  Lymphadenopathy:    She has no cervical adenopathy.  Neurological: She is alert and oriented to person,  place, and  time.  Skin: Skin is warm and dry.  Psychiatric: She has a normal mood and affect.     Labs:  Estimated body mass index is 33.91 kg/m as calculated from the following:   Height as of 07/24/18: 5' 2.5" (1.588 m).   Weight as of 07/24/18: 85.5 kg.   Imaging Review Plain radiographs demonstrate severe degenerative joint disease of the left knee. The bone quality appears to be good for age and reported activity level.    Assessment/Plan:  End stage arthritis, left knee   The patient history, physical examination, clinical judgment of the provider and imaging studies are consistent with end stage degenerative joint disease of the left knee(s) and total knee arthroplasty is deemed medically necessary. The treatment options including medical management, injection therapy arthroscopy and arthroplasty were discussed at length. The risks and benefits of total knee arthroplasty were presented and reviewed. The risks due to aseptic loosening, infection, stiffness, patella tracking problems, thromboembolic complications and other imponderables were discussed. The patient acknowledged the explanation, agreed to proceed with the plan and consent was signed. Patient is being admitted for inpatient treatment for surgery, pain control, PT, OT, prophylactic antibiotics, VTE prophylaxis, progressive ambulation and ADL's and discharge planning. The patient is planning to be discharged home.    Anticipated LOS equal to or greater than 2 midnights due to - Age 69 and older with one or more of the following:  - Obesity  - Expected need for hospital services (PT, OT, Nursing) required for safe  discharge  - Anticipated need for postoperative skilled nursing care or inpatient rehab  - Active co-morbidities: Diabetes and Coronary Artery Disease     [place]. Paiden Caraveo   PA-C  08/04/2018, 12:39 PM

## 2018-08-04 NOTE — Telephone Encounter (Signed)
Called Dr. Altheimer's office to see if pt's labs can be drawn with the labs that their office will be drawing for her (cbc, urine microalbumin).  Spoke w/Renee and she advised that it shouldn't be a problem.  Will fax to [phone] .Elouise Munroe, CMA  Confirmation received.Maryruth Eve, [name], CMA

## 2018-08-04 NOTE — Telephone Encounter (Signed)
Called pt and advised her that her labs have been faxed.[name], [name]

## 2018-08-04 NOTE — Progress Notes (Signed)
Established Patient Office Visit  Subjective:  Patient ID: [name], female    DOB: 12/24/1945  Age: 73 y.o. MRN: [id number]  CC:  Chief Complaint  Patient presents with  . surgical clearance    pt's surgery is 7/14.     HPI [name] presents for surgical clearance she is scheduled for surgery on August 26, 2018 for left total knee arthroplasty.  She has already had preop clearance with her cardiologist.  She has never had any problems with sedation or anesthesia in the past.  She will be getting an epidural for this procedure.  She does have a history of pretension, hypothyroidism and prediabetes.  She has gained a little bit of weight over the last 6 months because of COVID but otherwise is doing well.  Takes famotidine for her heartburn.  She does have a history of a chronic cough but it is been going on for years and is not worse than it normally is.  She does use Tessalon Perles occasionally.  No recent fevers or chills etc.  Past Medical History:  Diagnosis Date  . Allergic rhinitis, cause unspecified   . Anxiety   . Benign paroxysmal positional vertigo   . Cancer ([place])    basal cell carcinoma per right side of nostril  . Depressive disorder, not elsewhere classified   . Diabetes mellitus without complication ([place])    noted per H&P with Dr Dianah Field 08/03/2014   . Esophageal reflux   . History of hiatal hernia   . Hypertension   . Localized osteoarthrosis not specified whether primary or secondary, lower leg   . Other and unspecified hyperlipidemia   . Other diseases of vocal cords   . Peptic ulcer, unspecified site, unspecified as acute or chronic, without mention of hemorrhage, perforation, or obstruction   . Pneumonia    bilat pneumonia 1987  . Shortness of breath dyspnea    with anxiety; with climbing stairs  . TMJ (temporomandibular joint disorder)   . Unspecified arthropathy, hand   . Unspecified asthma(493.90)    triggered with a virus   .  Unspecified hypothyroidism     Past Surgical History:  Procedure Laterality Date  . BREAST BIOPSY  09/07/2011   High Risk Lesion  . BREAST EXCISIONAL BIOPSY Left   . BREAST SURGERY     LUMPECTOMY / LEFT 10/12/2011  . KNEE ARTHROSCOPY     bilat   . NASAL SINUS SURGERY    . ROTATOR CUFF REPAIR     right   . thumb surgery      left hand 45 years ago   . TOTAL HIP ARTHROPLASTY Right 08/10/2014   Procedure: RIGHT TOTAL HIP ARTHROPLASTY ANTERIOR APPROACH;  Surgeon: Paralee Cancel, MD;  Location: [facility];  Service: Orthopedics;  Laterality: Right;    Family History  Problem Relation Age of Onset  . Heart disease Father   . Colon cancer Mother   . Liver cancer Brother   . Asthma Brother     Social History   Socioeconomic History  . Marital status: Married    Spouse name: Not on file  . Number of children: 3  . Years of education: Not on file  . Highest education level: Not on file  Occupational History  . Occupation: Retired  Scientific laboratory technician  . Financial resource strain: Not on file  . Food insecurity    Worry: Not on file    Inability: Not on file  . Transportation needs  Medical: Not on file    Non-medical: Not on file  Tobacco Use  . Smoking status: Former Smoker    Packs/day: 0.30    Years: 2.00    Pack years: 0.60    Types: Cigarettes    Quit date: 02/12/1958    Years since quitting: 60.5  . Smokeless tobacco: Never Used  Substance and Sexual Activity  . Alcohol use: Yes    Comment: wine socially  . Drug use: No  . Sexual activity: Yes  Lifestyle  . Physical activity    Days per week: Not on file    Minutes per session: Not on file  . Stress: Not on file  Relationships  . Social Herbalist on phone: Not on file    Gets together: Not on file    Attends religious service: Not on file    Active member of club or organization: Not on file    Attends meetings of clubs or organizations: Not on file    Relationship status: Not on file  . Intimate  partner violence    Fear of current or ex partner: Not on file    Emotionally abused: Not on file    Physically abused: Not on file    Forced sexual activity: Not on file  Other Topics Concern  . Not on file  Social History Narrative  . Not on file    Outpatient Medications Prior to Visit  Medication Sig Dispense Refill  . albuterol (PROVENTIL HFA) 108 (90 Base) MCG/ACT inhaler Inhale 2 puffs into the lungs every 6 (six) hours as needed for shortness of breath. 6.7 g 5  . AMBULATORY NON FORMULARY MEDICATION APPLY 0 5GM TO AFFECTED PAINFUL AREAS UP TO 3 TIMES A DAY  0 5GM   1 PUMP C-AUTH SUB W/KETAMINE  3  . Ascorbic Acid (VITAMIN C) 1000 MG tablet Take 1 tablet once a day    . atorvastatin (LIPITOR) 10 MG tablet Take 10 mg by mouth daily.  3  . benzonatate (TESSALON) 200 MG capsule TAKE 1 CAPSULE BY MOUTH EVERY DAY 3 TIMES A DAY AS NEEDED FOR COUGH 45 capsule 3  . buPROPion (WELLBUTRIN XL) 150 MG 24 hr tablet Take by mouth.    . Calcium Carbonate-Vit D-Min 600-400 MG-UNIT TABS Take 1 tablet by mouth 2 (two) times daily.     . Cholecalciferol (VITAMIN D3) 50 MCG (2000 UT) capsule Take 1 capsule once a day    . clindamycin (CLEOCIN T) 1 % lotion Apply 1 application topically 2 (two) times daily as needed (bumps). 60 mL 1  . clonazePAM (KLONOPIN) 0.5 MG tablet clonazepam 0.5 mg tablet    . famotidine (PEPCID) 10 MG tablet Take 10 mg by mouth at bedtime.    . fexofenadine (ALLEGRA) 180 MG tablet Take 180 mg by mouth at bedtime.    Marland Kitchen Fexofenadine HCl (ALLEGRA ALLERGY PO) Allegra Allergy    . fluticasone (FLONASE) 50 MCG/ACT nasal spray Place 1 spray into both nostrils daily as needed for allergies or rhinitis.    . folic acid (FOLVITE) 1 MG tablet Take 1 mg by mouth 2 (two) times daily.      . Ginkgo Biloba 40 MG TABS Take as directed as needed    . Guaifenesin 1200 MG TB12 Take 1 tablet twice a day as needed    . hydrochlorothiazide (HYDRODIURIL) 25 MG tablet TAKE 1 TABLET BY MOUTH EVERY  DAY 90 tablet 2  . L-Lysine HCl 500 MG  TABS Take by mouth.    . levothyroxine (SYNTHROID, LEVOTHROID) 75 MCG tablet Take 75 mcg by mouth daily. Except Sunday 88 mcg    . levothyroxine (SYNTHROID, LEVOTHROID) 88 MCG tablet Take only sunday     . losartan (COZAAR) 50 MG tablet TAKE 1 TABLET BY MOUTH EVERY DAY 90 tablet 3  . meloxicam (MOBIC) 15 MG tablet TAKE 1 TABLET BY MOUTH EVERY MORNING 90 tablet 1  . metroNIDAZOLE (METROGEL) 1 % gel APPLY EVERY DAY (Patient taking differently: as needed. ) 60 g 1  . minoxidil (ROGAINE) 2 % external solution Apply 1 application topically 2 (two) times daily.    . ondansetron (ZOFRAN) 4 MG tablet Take 1 tablet (4 mg total) by mouth every 8 (eight) hours as needed for nausea or vomiting. 12 tablet 0  . pantoprazole (PROTONIX) 40 MG tablet Take 1 tablet (40 mg total) by mouth daily. 90 tablet 3  . PARoxetine (PAXIL-CR) 12.5 MG 24 hr tablet Take 12.5 mg by mouth 2 (two) times daily.     . Saline (SIMPLY SALINE) 0.9 % AERS Place 1 spray into the nose 2 (two) times daily as needed (dry).    . SYMBICORT 80-4.5 MCG/ACT inhaler INHALE 2 PUFFS TWICE A DAY (Patient taking differently: 1 puff. ) 30.6 Inhaler 3   No facility-administered medications prior to visit.     Allergies  Allergen Reactions  . Astelin [Azelastine Hcl] Other (See Comments)    Headache  . Lisinopril     ACE cough  . Morphine     REACTION: Nausea  . Moxifloxacin     REACTION: confusion, dizziness, paranoia    ROS Review of Systems    Objective:    Physical Exam  Constitutional: She is oriented to person, place, and time. She appears well-developed and well-nourished.  HENT:  Head: Normocephalic and atraumatic.  Right Ear: External ear normal.  Left Ear: External ear normal.  Nose: Nose normal.  Mouth/Throat: Oropharynx is clear and moist.  TMs and canals are clear.   Eyes: Pupils are equal, round, and reactive to light. Conjunctivae and EOM are normal.  Neck: Neck supple. No  thyromegaly present.  Cardiovascular: Normal rate, regular rhythm and normal heart sounds.  We will pulse 2+.  No carotid bruits.  Pulmonary/Chest: Effort normal and breath sounds normal. She has no wheezes.  Abdominal: Soft. Bowel sounds are normal. She exhibits no distension and no mass. There is no abdominal tenderness. There is no rebound and no guarding.  Musculoskeletal:        General: No edema.  Lymphadenopathy:    She has no cervical adenopathy.  Neurological: She is alert and oriented to person, place, and time.  Skin: Skin is warm and dry.  Psychiatric: She has a normal mood and affect. Her behavior is normal. Judgment and thought content normal.    BP 135/70   Pulse 89   Ht 5\' 7"  (1.702 m)   Wt 188 lb (85.3 kg)   SpO2 98%   BMI 29.44 kg/m  Wt Readings from Last 3 Encounters:  08/04/18 188 lb (85.3 kg)  07/24/18 188 lb 6.4 oz (85.5 kg)  04/25/18 189 lb (85.7 kg)     Health Maintenance Due  Topic Date Due  . COLONOSCOPY  03/24/2017    There are no preventive care reminders to display for this patient.  Lab Results  Component Value Date   TSH 0.69 11/07/2016   Lab Results  Component Value Date   WBC 6.3  11/07/2016   HGB 12.5 11/07/2016   HCT 37.8 11/07/2016   MCV 84.6 11/07/2016   PLT 310 11/07/2016   Lab Results  Component Value Date   NA 139 12/04/2017   K 4.5 12/04/2017   CO2 29 11/07/2016   GLUCOSE 99 11/07/2016   BUN 20 12/04/2017   CREATININE 0.7 12/04/2017   BILITOT 0.5 11/07/2016   ALKPHOS 60 01/16/2016   AST 13 12/04/2017   ALT 23 12/04/2017   PROT 6.0 (L) 11/07/2016   ALBUMIN 4.0 01/16/2016   CALCIUM 9.1 11/07/2016   ANIONGAP 7 08/11/2014   Lab Results  Component Value Date   CHOL 201 (A) 02/13/2015   Lab Results  Component Value Date   HDL 58 02/13/2015   Lab Results  Component Value Date   LDLCALC 119 02/13/2015   Lab Results  Component Value Date   TRIG 118 02/13/2015   Lab Results  Component Value Date   CHOLHDL  3.6 CALC 09/18/2006   Lab Results  Component Value Date   HGBA1C 6.3 (A) 08/04/2018      Assessment & Plan:   Problem List Items Addressed This Visit      Cardiovascular and Mediastinum   Essential hypertension, benign     Endocrine   IFG (impaired fasting glucose) - Primary   Relevant Orders   POCT glycosylated hemoglobin (Hb A1C) (Completed)   CBC   Urinalysis, Routine w reflex microscopic   Hypothyroidism    Other Visit Diagnoses    Pre-op exam       Relevant Orders   CBC   Urinalysis, Routine w reflex microscopic     Preoperative clearance-she is cleared for surgery and I think overall she is low risk particularly with getting an epidural.  Her hemoglobin A1c is elevated at 6.3 today but still under 6.5.  She is not a smoker.  She is getting ready to have some labs drawn next week with her endocrinologist so once we get a copy of those we will add that into her preoperative risk evaluation.  We are requesting that they add a CBC and a urinalysis to her regular labs that she gets done.  She already had EKG done with cardiology.  Lab Results  Component Value Date   HGBA1C 6.3 (A) 08/04/2018      No orders of the defined types were placed in this encounter.   Follow-up: Return if symptoms worsen or fail to improve.    Beatrice Lecher, MD

## 2018-08-04 NOTE — Telephone Encounter (Signed)
Please call her endocrinologist.  She is getting labs drawn next week routinely.  She normally gets a BMP drawn but I am specifically requesting that they also add a CBC as well as a urinalysis.  Please have them fax those results over to [place] once they are done so that we can send them off with her presurgical clearance she is scheduled for left knee replacement in July.

## 2018-08-05 DIAGNOSIS — M5030 Other cervical disc degeneration, unspecified cervical region: Secondary | ICD-10-CM | POA: Diagnosis not present

## 2018-08-05 NOTE — Telephone Encounter (Signed)
I called the office of Dr Altheimer and requested add on to future labs to be faxed to Dr Madilyn Fireman once resulted. The message will be sent to Dr Altheimer for review.

## 2018-08-05 NOTE — Telephone Encounter (Signed)
Altheimer, Clayburn Pert, MD  [address], [place] [address]  [phone]  [phone] (Fax)

## 2018-08-07 DIAGNOSIS — Z01818 Encounter for other preprocedural examination: Secondary | ICD-10-CM | POA: Diagnosis not present

## 2018-08-07 DIAGNOSIS — R7301 Impaired fasting glucose: Secondary | ICD-10-CM | POA: Diagnosis not present

## 2018-08-07 DIAGNOSIS — E7800 Pure hypercholesterolemia, unspecified: Secondary | ICD-10-CM | POA: Diagnosis not present

## 2018-08-07 DIAGNOSIS — E039 Hypothyroidism, unspecified: Secondary | ICD-10-CM | POA: Diagnosis not present

## 2018-08-07 DIAGNOSIS — I10 Essential (primary) hypertension: Secondary | ICD-10-CM | POA: Diagnosis not present

## 2018-08-07 LAB — HEPATIC FUNCTION PANEL
ALT: 28 (ref 7–35)
AST: 15 (ref 13–35)
Alkaline Phosphatase: 57 (ref 25–125)
Bilirubin, Total: 0.4

## 2018-08-07 LAB — BASIC METABOLIC PANEL
BUN: 21 (ref 4–21)
Creatinine: 0.8 (ref 0.5–1.1)
Glucose: 103
Potassium: 4.4 (ref 3.4–5.3)

## 2018-08-07 LAB — LIPID PANEL
Cholesterol: 195 (ref 0–200)
HDL: 59 (ref 35–70)
LDL Cholesterol: 126
Triglycerides: 110 (ref 40–160)

## 2018-08-07 LAB — HEMOGLOBIN A1C
Hemoglobin A1C: 6.2
Hemoglobin A1C: 6.2

## 2018-08-07 LAB — TSH: TSH: 0.48 (ref 0.41–5.90)

## 2018-08-11 DIAGNOSIS — G894 Chronic pain syndrome: Secondary | ICD-10-CM | POA: Diagnosis not present

## 2018-08-11 DIAGNOSIS — M542 Cervicalgia: Secondary | ICD-10-CM | POA: Diagnosis not present

## 2018-08-14 DIAGNOSIS — R5382 Chronic fatigue, unspecified: Secondary | ICD-10-CM | POA: Diagnosis not present

## 2018-08-14 DIAGNOSIS — L659 Nonscarring hair loss, unspecified: Secondary | ICD-10-CM | POA: Diagnosis not present

## 2018-08-14 DIAGNOSIS — E7800 Pure hypercholesterolemia, unspecified: Secondary | ICD-10-CM | POA: Diagnosis not present

## 2018-08-14 DIAGNOSIS — E559 Vitamin D deficiency, unspecified: Secondary | ICD-10-CM | POA: Diagnosis not present

## 2018-08-14 DIAGNOSIS — R7303 Prediabetes: Secondary | ICD-10-CM | POA: Diagnosis not present

## 2018-08-14 DIAGNOSIS — I10 Essential (primary) hypertension: Secondary | ICD-10-CM | POA: Diagnosis not present

## 2018-08-14 DIAGNOSIS — R7301 Impaired fasting glucose: Secondary | ICD-10-CM | POA: Diagnosis not present

## 2018-08-14 DIAGNOSIS — E039 Hypothyroidism, unspecified: Secondary | ICD-10-CM | POA: Diagnosis not present

## 2018-08-19 DIAGNOSIS — F410 Panic disorder [episodic paroxysmal anxiety] without agoraphobia: Secondary | ICD-10-CM | POA: Diagnosis not present

## 2018-08-19 DIAGNOSIS — F422 Mixed obsessional thoughts and acts: Secondary | ICD-10-CM | POA: Diagnosis not present

## 2018-08-19 DIAGNOSIS — F902 Attention-deficit hyperactivity disorder, combined type: Secondary | ICD-10-CM | POA: Diagnosis not present

## 2018-08-20 ENCOUNTER — Encounter (HOSPITAL_COMMUNITY): Payer: Self-pay

## 2018-08-20 NOTE — Progress Notes (Signed)
CARDIAC CLEARANCE NOTE Leanor Kail PA 07-24-18 Epic EKG 07-24-18 Epic CHEST XRAY 04-25-18 EPIC

## 2018-08-20 NOTE — Patient Instructions (Addendum)
YOU HAD  A COVID 19 TEST ON 08-22-2018 @ 115 PM.  ONCE YOUR COVID TEST IS COMPLETED, PLEASE BEGIN THE QUARANTINE INSTRUCTIONS AS OUTLINED IN YOUR HANDOUT.                [name]     Your procedure is scheduled on: 08-26-2018   Report to [facility]  Entrance  Report to admitting at 10:05 AM      Call this number if you have problems the morning of surgery [phone]     Remember: [place].   NO SOLID FOOD AFTER MIDNIGHT THE NIGHT PRIOR TO SURGERY.  NOTHING BY MOUTH EXCEPT CLEAR LIQUIDS UNTIL 9:35 AM.  . PLEASE FINISHG2 DRINK PER SURGEON ORDER 3 HOURS PRIOR TO SCHEDULED SURGERY TIME WHICH NEEDS TO BE COMPLETED AT 9:35 AM.   CLEAR LIQUID DIET   Foods Allowed                                                                     Foods Excluded  Coffee and tea, regular and decaf                             liquids that you cannot  Plain Jell-O in any flavor                                             see through such as: Fruit ices (not with fruit pulp)                                     milk, soups, orange juice  Iced Popsicles                                    All solid food Carbonated beverages, regular and diet                                    Cranberry, grape and apple juices Sports drinks like Gatorade Lightly seasoned clear broth or consume(fat free) Sugar, honey syrup  Sample Menu Breakfast                                Lunch                                     Supper Cranberry juice                    Beef broth                            Chicken broth Jell-O  Grape juice                           Apple juice Coffee or tea                        Jell-O                                      Popsicle                                                Coffee or tea                        Coffee or  tea  _____________________________________________________________________     Take these medicines the morning of surgery with A SIP OF WATER:  Wellbutrin, Paxil-CR, Synthroid, Protonix, Bring your Albuterol and Symbicort to the hospital with you                               You may not have any metal on your body including hair pins and              piercings               Do not wear jewelry, make-up, lotions, powders or perfumes, deodorant             Do not wear nail polish.  Do not shave  48 hours prior to surgery.            Do not bring valuables to the hospital. [place].  Contacts, dentures or bridgework may not be worn into surgery.  om.     _____________________________________________________________________             [facility] - Preparing for Surgery Before surgery, you can play an important role.   Because skin is not sterile, your skin needs to be as free of germs as possible.   You can reduce the number of germs on your skin by washing with CHG (chlorahexidine gluconate) soap before surgery.   CHG is an antiseptic cleaner which kills germs and bonds with the skin to continue killing germs even after washing. Please DO NOT use if you have an allergy to CHG or antibacterial soaps.   If your skin becomes reddened/irritated stop using the CHG and inform your nurse when you arrive at Short Stay. Do not shave (including legs and underarms) for at least 48 hours prior to the first CHG shower.  Please follow these instructions carefully:  1.  Shower with CHG Soap the night before surgery and the  morning of Surgery.  2.  If you choose to wash your hair, wash your hair first as usual with your  normal  shampoo.  3.  After you shampoo, rinse your hair and body thoroughly to remove the  shampoo.  4.  Use CHG as you would any other liquid soap.  You can apply chg directly  to the skin  and wash                       Gently with a scrungie or clean washcloth.  5.  Apply the CHG Soap to your body ONLY FROM THE NECK DOWN.   Do not use on face/ open                           Wound or open sores. Avoid contact with eyes, ears mouth and genitals (private parts).                       Wash face,  Genitals (private parts) with your normal soap.             6.  Wash thoroughly, paying special attention to the area where your surgery  will be performed.  7.  Thoroughly rinse your body with warm water from the neck down.  8.  DO NOT shower/wash with your normal soap after using and rinsing off  the CHG Soap.             9.  Pat yourself dry with a clean towel.            10.  Wear clean pajamas.            11.  Place clean sheets on your bed the night of your first shower and do not  sleep with pets. Day of Surgery : Do not apply any lotions/deodorants the morning of surgery.  Please wear clean clothes to the hospital/surgery center.  FAILURE TO FOLLOW THESE INSTRUCTIONS MAY RESULT IN THE CANCELLATION OF YOUR SURGERY PATIENT SIGNATURE_________________________________  NURSE SIGNATURE__________________________________  ________________________________________________________________________   [name]  An incentive spirometer is a tool that can help keep your lungs clear and active. This tool measures how well you are filling your lungs with each breath. Taking long deep breaths may help reverse or decrease the chance of developing breathing (pulmonary) problems (especially infection) following:  A long period of time when you are unable to move or be active. BEFORE THE PROCEDURE   If the spirometer includes an indicator to show your best effort, your nurse or respiratory therapist will set it to a desired goal.  If possible, sit up straight or lean slightly forward. Try not to slouch.  Hold the incentive spirometer in an upright position. INSTRUCTIONS FOR USE   1. Sit on the edge of your bed if possible, or sit up as far as you can in bed or on a chair. 2. Hold the incentive spirometer in an upright position. 3. Breathe out normally. 4. Place the mouthpiece in your mouth and seal your lips tightly around it. 5. Breathe in slowly and as deeply as possible, raising the piston or the ball toward the top of the column. 6. Hold your breath for 3-5 seconds or for as long as possible. Allow the piston or ball to fall to the bottom of the column. 7. Remove the mouthpiece from your mouth and breathe out normally. 8. Rest for a few seconds and repeat Steps 1 through 7 at least 10 times every 1-2 hours when you are awake. Take your time and take a few normal breaths between deep breaths. 9. The spirometer may include an indicator to show your best  effort. Use the indicator as a goal to work toward during each repetition. 10. After each set of 10 deep breaths, practice coughing to be sure your lungs are clear. If you have an incision (the cut made at the time of surgery), support your incision when coughing by placing a pillow or rolled up towels firmly against it. Once you are able to get out of bed, walk around indoors and cough well. You may stop using the incentive spirometer when instructed by your caregiver.  RISKS AND COMPLICATIONS  Take your time so you do not get dizzy or light-headed.  If you are in pain, you may need to take or ask for pain medication before doing incentive spirometry. It is harder to take a deep breath if you are having pain. AFTER USE  Rest and breathe slowly and easily.  It can be helpful to keep track of a log of your progress. Your caregiver can provide you with a simple table to help with this. If you are using the spirometer at home, follow these instructions: [place] IF:   You are having difficultly using the spirometer.  You have trouble using the spirometer as often as instructed.  Your pain medication is  not giving enough relief while using the spirometer.  You develop fever of 100.5 F (38.1 C) or higher. SEEK IMMEDIATE MEDICAL CARE IF:   You cough up bloody sputum that had not been present before.  You develop fever of 102 F (38.9 C) or greater.  You develop worsening pain at or near the incision site. MAKE SURE YOU:   Understand these instructions.  Will watch your condition.  Will get help right away if you are not doing well or get worse. Document Released: 06/11/2006 Document Revised: 04/23/2011 Document Reviewed: 08/12/2006 Exit[employer] Patient Information 2014 ExitCare, [place].   ________________________________________________________________________  WHAT IS A BLOOD TRANSFUSION? Blood Transfusion Information  A transfusion is the replacement of blood or some of its parts. Blood is made up of multiple cells which provide different functions.  Red blood cells carry oxygen and are used for blood loss replacement.  White blood cells fight against infection.  Platelets control bleeding.  Plasma helps clot blood.  Other blood products are available for specialized needs, such as hemophilia or other clotting disorders. BEFORE THE TRANSFUSION  Who gives blood for transfusions?   Healthy volunteers who are fully evaluated to make sure their blood is safe. This is blood bank blood. Transfusion therapy is the safest it has ever been in the practice of medicine. Before blood is taken from a donor, a complete history is taken to make sure that person has no history of diseases nor engages in risky social behavior (examples are intravenous drug use or sexual activity with multiple partners). The donor's travel history is screened to minimize risk of transmitting infections, such as malaria. The donated blood is tested for signs of infectious diseases, such as HIV and hepatitis. The blood is then tested to be sure it is compatible with you in order to minimize the chance of a  transfusion reaction. If you or a relative donates blood, this is often done in anticipation of surgery and is not appropriate for emergency situations. It takes many days to process the donated blood. RISKS AND COMPLICATIONS Although transfusion therapy is very safe and saves many lives, the main dangers of transfusion include:   Getting an infectious disease.  Developing a transfusion reaction. This is an allergic reaction to something in  the blood you were given. Every precaution is taken to prevent this. The decision to have a blood transfusion has been considered carefully by your caregiver before blood is given. Blood is not given unless the benefits outweigh the risks. AFTER THE TRANSFUSION  Right after receiving a blood transfusion, you will usually feel much better and more energetic. This is especially true if your red blood cells have gotten low (anemic). The transfusion raises the level of the red blood cells which carry oxygen, and this usually causes an energy increase.  The nurse administering the transfusion will monitor you carefully for complications. HOME CARE INSTRUCTIONS  No special instructions are needed after a transfusion. You may find your energy is better. Speak with your caregiver about any limitations on activity for underlying diseases you may have. SEEK MEDICAL CARE IF:   Your condition is not improving after your transfusion.  You develop redness or irritation at the intravenous (IV) site. SEEK IMMEDIATE MEDICAL CARE IF:  Any of the following symptoms occur over the next 12 hours:  Shaking chills.  You have a temperature by mouth above 102 F (38.9 C), not controlled by medicine.  Chest, back, or muscle pain.  People around you feel you are not acting correctly or are confused.  Shortness of breath or difficulty breathing.  Dizziness and fainting.  You get a rash or develop hives.  You have a decrease in urine output.  Your urine turns a dark  color or changes to pink, red, or brown. Any of the following symptoms occur over the next 10 days:  You have a temperature by mouth above 102 F (38.9 C), not controlled by medicine.  Shortness of breath.  Weakness after normal activity.  The white part of the eye turns yellow (jaundice).  You have a decrease in the amount of urine or are urinating less often.  Your urine turns a dark color or changes to pink, red, or brown. Document Released: 01/27/2000 Document Revised: 04/23/2011 Document Reviewed: 09/15/2007 [facility] Patient Information 2014 [place], [place].  _______________________________________________________________________

## 2018-08-21 DIAGNOSIS — F902 Attention-deficit hyperactivity disorder, combined type: Secondary | ICD-10-CM | POA: Diagnosis not present

## 2018-08-21 DIAGNOSIS — F422 Mixed obsessional thoughts and acts: Secondary | ICD-10-CM | POA: Diagnosis not present

## 2018-08-21 DIAGNOSIS — F410 Panic disorder [episodic paroxysmal anxiety] without agoraphobia: Secondary | ICD-10-CM | POA: Diagnosis not present

## 2018-08-22 ENCOUNTER — Encounter: Payer: Self-pay | Admitting: Family Medicine

## 2018-08-22 ENCOUNTER — Encounter (HOSPITAL_COMMUNITY): Payer: Self-pay

## 2018-08-22 ENCOUNTER — Other Ambulatory Visit: Payer: Self-pay

## 2018-08-22 ENCOUNTER — Other Ambulatory Visit (HOSPITAL_COMMUNITY)
Admission: RE | Admit: 2018-08-22 | Discharge: 2018-08-22 | Disposition: A | Discharge status: Home / Self Care | Payer: Medicare Other | Source: Ambulatory Visit | Attending: Orthopedic Surgery | Admitting: Orthopedic Surgery

## 2018-08-22 ENCOUNTER — Encounter (HOSPITAL_COMMUNITY)
Admission: RE | Admit: 2018-08-22 | Discharge: 2018-08-22 | Disposition: A | Discharge status: Home / Self Care | Payer: Medicare Other | Source: Ambulatory Visit | Attending: Orthopedic Surgery | Admitting: Orthopedic Surgery

## 2018-08-22 DIAGNOSIS — M1712 Unilateral primary osteoarthritis, left knee: Secondary | ICD-10-CM | POA: Insufficient documentation

## 2018-08-22 DIAGNOSIS — Z1159 Encounter for screening for other viral diseases: Secondary | ICD-10-CM | POA: Insufficient documentation

## 2018-08-22 DIAGNOSIS — Z01812 Encounter for preprocedural laboratory examination: Secondary | ICD-10-CM | POA: Diagnosis not present

## 2018-08-22 HISTORY — DX: Prediabetes: R73.03

## 2018-08-22 HISTORY — DX: Other complications of anesthesia, initial encounter: T88.59XA

## 2018-08-22 LAB — CBC
HCT: 41 % (ref 36.0–46.0)
Hemoglobin: 12.8 g/dL (ref 12.0–15.0)
MCH: 27.9 pg (ref 26.0–34.0)
MCHC: 31.2 g/dL (ref 30.0–36.0)
MCV: 89.3 fL (ref 80.0–100.0)
Platelets: 348 10*3/uL (ref 150–400)
RBC: 4.59 MIL/uL (ref 3.87–5.11)
RDW: 13.3 % (ref 11.5–15.5)
WBC: 7.5 10*3/uL (ref 4.0–10.5)
nRBC: 0 % (ref 0.0–0.2)

## 2018-08-22 LAB — BASIC METABOLIC PANEL
Anion gap: 9 (ref 5–15)
BUN: 21 mg/dL (ref 8–23)
CO2: 28 mmol/L (ref 22–32)
Calcium: 9.3 mg/dL (ref 8.9–10.3)
Chloride: 100 mmol/L (ref 98–111)
Creatinine, Ser: 0.78 mg/dL (ref 0.44–1.00)
GFR calc Af Amer: >60 mL/min (ref >60)
GFR calc non Af Amer: >60 mL/min (ref >60)
Glucose, Bld: 98 mg/dL (ref 70–99)
Potassium: 4 mmol/L (ref 3.5–5.1)
Sodium: 137 mmol/L (ref 135–145)

## 2018-08-22 LAB — GLUCOSE, CAPILLARY: Glucose-Capillary: 99 mg/dL (ref 70–99)

## 2018-08-22 LAB — SURGICAL PCR SCREEN
MRSA, PCR: NEGATIVE
Staphylococcus aureus: NEGATIVE

## 2018-08-22 LAB — HEMOGLOBIN A1C
Hgb A1c MFr Bld: 6.2 % — ABNORMAL HIGH (ref 4.8–5.6)
Mean Plasma Glucose: 131.24 mg/dL

## 2018-08-23 LAB — SARS CORONAVIRUS 2 (TAT 6-24 HRS): SARS Coronavirus 2: NEGATIVE

## 2018-08-25 NOTE — Anesthesia Preprocedure Evaluation (Addendum)
Anesthesia Evaluation  Patient identified by MRN, date of birth, ID band Patient awake    Reviewed: Allergy & Precautions, NPO status , Patient's Chart, lab work & pertinent test results  History of Anesthesia Complications Negative for: history of anesthetic complications  Airway Mallampati: II  TM Distance: >3 FB Neck ROM: Full    Dental no notable dental hx.    Pulmonary asthma , former smoker,    Pulmonary exam normal        Cardiovascular hypertension, Pt. on medications Normal cardiovascular exam     Neuro/Psych Anxiety Depression negative neurological ROS     GI/Hepatic Neg liver ROS, hiatal hernia, PUD, GERD  Medicated and Controlled,  Endo/Other  Hypothyroidism   Renal/GU negative Renal ROS     Musculoskeletal  (+) Arthritis , Rheumatoid disorders,    Abdominal   Peds  Hematology negative hematology ROS (+)   Anesthesia Other Findings Day of surgery medications reviewed with the patient.  Reproductive/Obstetrics                            Anesthesia Physical Anesthesia Plan  ASA: II  Anesthesia Plan: Spinal   Post-op Pain Management:  Regional for Post-op pain   Induction:   PONV Risk Score and Plan: 3 and Treatment may vary due to age or medical condition, Ondansetron, Propofol infusion, Dexamethasone and Midazolam  Airway Management Planned: Natural Airway and Simple Face Mask  Additional Equipment:   Intra-op Plan:   Post-operative Plan:   Informed Consent: I have reviewed the patients History and Physical, chart, labs and discussed the procedure including the risks, benefits and alternatives for the proposed anesthesia with the patient or authorized representative who has indicated his/her understanding and acceptance.     Dental advisory given  Plan Discussed with: CRNA  Anesthesia Plan Comments:        Anesthesia Quick Evaluation

## 2018-08-26 ENCOUNTER — Other Ambulatory Visit: Payer: Self-pay

## 2018-08-26 ENCOUNTER — Ambulatory Visit (HOSPITAL_COMMUNITY): Payer: Medicare Other | Admitting: Anesthesiology

## 2018-08-26 ENCOUNTER — Observation Stay (HOSPITAL_COMMUNITY)
Admission: RE | Admit: 2018-08-26 | Discharge: 2018-08-27 | Disposition: A | Discharge status: Home / Self Care | Payer: Medicare Other | Attending: Orthopedic Surgery | Admitting: Orthopedic Surgery

## 2018-08-26 ENCOUNTER — Encounter (HOSPITAL_COMMUNITY): Payer: Self-pay

## 2018-08-26 ENCOUNTER — Ambulatory Visit (HOSPITAL_COMMUNITY): Payer: Medicare Other | Admitting: Physician Assistant

## 2018-08-26 ENCOUNTER — Encounter (HOSPITAL_COMMUNITY)
Admission: RE | Disposition: A | Discharge status: Home / Self Care | Payer: Self-pay | Source: Home / Self Care | Attending: Orthopedic Surgery

## 2018-08-26 DIAGNOSIS — Z87891 Personal history of nicotine dependence: Secondary | ICD-10-CM | POA: Insufficient documentation

## 2018-08-26 DIAGNOSIS — I110 Hypertensive heart disease with heart failure: Secondary | ICD-10-CM | POA: Insufficient documentation

## 2018-08-26 DIAGNOSIS — M1712 Unilateral primary osteoarthritis, left knee: Principal | ICD-10-CM | POA: Insufficient documentation

## 2018-08-26 DIAGNOSIS — I509 Heart failure, unspecified: Secondary | ICD-10-CM | POA: Insufficient documentation

## 2018-08-26 DIAGNOSIS — M25762 Osteophyte, left knee: Secondary | ICD-10-CM | POA: Diagnosis not present

## 2018-08-26 DIAGNOSIS — J45909 Unspecified asthma, uncomplicated: Secondary | ICD-10-CM | POA: Insufficient documentation

## 2018-08-26 DIAGNOSIS — E559 Vitamin D deficiency, unspecified: Secondary | ICD-10-CM | POA: Insufficient documentation

## 2018-08-26 DIAGNOSIS — E663 Overweight: Secondary | ICD-10-CM | POA: Diagnosis present

## 2018-08-26 DIAGNOSIS — E119 Type 2 diabetes mellitus without complications: Secondary | ICD-10-CM | POA: Diagnosis not present

## 2018-08-26 DIAGNOSIS — Z7951 Long term (current) use of inhaled steroids: Secondary | ICD-10-CM | POA: Diagnosis not present

## 2018-08-26 DIAGNOSIS — E785 Hyperlipidemia, unspecified: Secondary | ICD-10-CM | POA: Diagnosis not present

## 2018-08-26 DIAGNOSIS — Z79899 Other long term (current) drug therapy: Secondary | ICD-10-CM | POA: Insufficient documentation

## 2018-08-26 DIAGNOSIS — G8918 Other acute postprocedural pain: Secondary | ICD-10-CM | POA: Diagnosis not present

## 2018-08-26 DIAGNOSIS — E039 Hypothyroidism, unspecified: Secondary | ICD-10-CM | POA: Diagnosis not present

## 2018-08-26 DIAGNOSIS — F419 Anxiety disorder, unspecified: Secondary | ICD-10-CM | POA: Insufficient documentation

## 2018-08-26 DIAGNOSIS — M659 Synovitis and tenosynovitis, unspecified: Secondary | ICD-10-CM | POA: Insufficient documentation

## 2018-08-26 DIAGNOSIS — Z96641 Presence of right artificial hip joint: Secondary | ICD-10-CM | POA: Insufficient documentation

## 2018-08-26 DIAGNOSIS — K219 Gastro-esophageal reflux disease without esophagitis: Secondary | ICD-10-CM | POA: Insufficient documentation

## 2018-08-26 DIAGNOSIS — F329 Major depressive disorder, single episode, unspecified: Secondary | ICD-10-CM | POA: Insufficient documentation

## 2018-08-26 DIAGNOSIS — Z791 Long term (current) use of non-steroidal anti-inflammatories (NSAID): Secondary | ICD-10-CM | POA: Diagnosis not present

## 2018-08-26 DIAGNOSIS — I10 Essential (primary) hypertension: Secondary | ICD-10-CM | POA: Diagnosis not present

## 2018-08-26 DIAGNOSIS — Z85828 Personal history of other malignant neoplasm of skin: Secondary | ICD-10-CM | POA: Diagnosis not present

## 2018-08-26 DIAGNOSIS — Z79890 Hormone replacement therapy (postmenopausal): Secondary | ICD-10-CM | POA: Insufficient documentation

## 2018-08-26 DIAGNOSIS — Z96652 Presence of left artificial knee joint: Secondary | ICD-10-CM

## 2018-08-26 HISTORY — PX: TOTAL KNEE ARTHROPLASTY: SHX125

## 2018-08-26 LAB — TYPE AND SCREEN
ABO/RH(D): O POS
Antibody Screen: NEGATIVE

## 2018-08-26 LAB — GLUCOSE, CAPILLARY: Glucose-Capillary: 104 mg/dL — ABNORMAL HIGH (ref 70–99)

## 2018-08-26 SURGERY — ARTHROPLASTY, KNEE, TOTAL
Anesthesia: Spinal | Site: Knee | Laterality: Left

## 2018-08-26 MED ORDER — DEXAMETHASONE SODIUM PHOSPHATE 10 MG/ML IJ SOLN
10.0000 mg | Freq: Once | INTRAMUSCULAR | Status: AC
  Administered 2018-08-27: 10 mg via INTRAVENOUS
  Filled 2018-08-26: qty 1

## 2018-08-26 MED ORDER — CELECOXIB 200 MG PO CAPS
200.0000 mg | ORAL_CAPSULE | Freq: Two times a day (BID) | ORAL | Status: DC
  Administered 2018-08-26 – 2018-08-27 (×3): 200 mg via ORAL
  Filled 2018-08-26 (×3): qty 1

## 2018-08-26 MED ORDER — KETOROLAC TROMETHAMINE 30 MG/ML IJ SOLN
INTRAMUSCULAR | Status: DC | PRN
  Administered 2018-08-26: 30 mg via INTRAMUSCULAR

## 2018-08-26 MED ORDER — SODIUM CHLORIDE (PF) 0.9 % IJ SOLN
INTRAMUSCULAR | Status: AC
  Filled 2018-08-26: qty 50

## 2018-08-26 MED ORDER — ALUM & MAG HYDROXIDE-SIMETH 200-200-20 MG/5ML PO SUSP: 15.0000 mL | ORAL | Status: DC | PRN

## 2018-08-26 MED ORDER — KETOROLAC TROMETHAMINE 30 MG/ML IJ SOLN
INTRAMUSCULAR | Status: AC
  Filled 2018-08-26: qty 1

## 2018-08-26 MED ORDER — LIDOCAINE HCL (CARDIAC) PF 100 MG/5ML IV SOSY
PREFILLED_SYRINGE | INTRAVENOUS | Status: DC | PRN
  Administered 2018-08-26: 50 mg via INTRAVENOUS

## 2018-08-26 MED ORDER — ATORVASTATIN CALCIUM 10 MG PO TABS
10.0000 mg | ORAL_TABLET | ORAL | Status: DC
  Filled 2018-08-26: qty 1

## 2018-08-26 MED ORDER — LACTATED RINGERS IV SOLN
INTRAVENOUS | Status: DC
  Administered 2018-08-26 (×2): via INTRAVENOUS

## 2018-08-26 MED ORDER — PROPOFOL 10 MG/ML IV BOLUS
INTRAVENOUS | Status: AC
  Filled 2018-08-26: qty 40

## 2018-08-26 MED ORDER — PHENYLEPHRINE 40 MCG/ML (10ML) SYRINGE FOR IV PUSH (FOR BLOOD PRESSURE SUPPORT)
PREFILLED_SYRINGE | INTRAVENOUS | Status: DC | PRN
  Administered 2018-08-26: 80 ug via INTRAVENOUS
  Administered 2018-08-26: 40 ug via INTRAVENOUS
  Administered 2018-08-26 (×4): 80 ug via INTRAVENOUS
  Administered 2018-08-26: 120 ug via INTRAVENOUS

## 2018-08-26 MED ORDER — MIDAZOLAM HCL 2 MG/2ML IJ SOLN
1.0000 mg | INTRAMUSCULAR | Status: DC
  Administered 2018-08-26: 09:00:00 1 mg via INTRAVENOUS
  Filled 2018-08-26: qty 2

## 2018-08-26 MED ORDER — ACETAMINOPHEN 500 MG PO TABS
1000.0000 mg | ORAL_TABLET | Freq: Four times a day (QID) | ORAL | Status: AC
  Administered 2018-08-26 – 2018-08-27 (×4): 1000 mg via ORAL
  Filled 2018-08-26 (×3): qty 2

## 2018-08-26 MED ORDER — FENTANYL CITRATE (PF) 100 MCG/2ML IJ SOLN: 25.0000 ug | INTRAMUSCULAR | Status: DC | PRN

## 2018-08-26 MED ORDER — METHOCARBAMOL 500 MG PO TABS
500.0000 mg | ORAL_TABLET | Freq: Four times a day (QID) | ORAL | Status: DC | PRN
  Administered 2018-08-26: 18:00:00 500 mg via ORAL
  Filled 2018-08-26: qty 1

## 2018-08-26 MED ORDER — FAMOTIDINE 20 MG PO TABS
20.0000 mg | ORAL_TABLET | Freq: Every day | ORAL | Status: DC
  Administered 2018-08-26: 22:00:00 20 mg via ORAL
  Filled 2018-08-26: qty 1

## 2018-08-26 MED ORDER — TRANEXAMIC ACID-NACL 1000-0.7 MG/100ML-% IV SOLN
1000.0000 mg | Freq: Once | INTRAVENOUS | Status: AC
  Administered 2018-08-26: 15:00:00 1000 mg via INTRAVENOUS
  Filled 2018-08-26: qty 100

## 2018-08-26 MED ORDER — DIPHENHYDRAMINE HCL 12.5 MG/5ML PO ELIX: 12.5000 mg | ORAL_SOLUTION | ORAL | Status: DC | PRN

## 2018-08-26 MED ORDER — DOCUSATE SODIUM 100 MG PO CAPS
100.0000 mg | ORAL_CAPSULE | Freq: Two times a day (BID) | ORAL | Status: DC
  Administered 2018-08-26 – 2018-08-27 (×2): 100 mg via ORAL
  Filled 2018-08-26 (×2): qty 1

## 2018-08-26 MED ORDER — BUPIVACAINE IN DEXTROSE 0.75-8.25 % IT SOLN
INTRATHECAL | Status: DC | PRN
  Administered 2018-08-26: 1.6 mL via INTRATHECAL

## 2018-08-26 MED ORDER — ONDANSETRON HCL 4 MG/2ML IJ SOLN: 4.0000 mg | Freq: Four times a day (QID) | INTRAMUSCULAR | Status: DC | PRN

## 2018-08-26 MED ORDER — BUPROPION HCL ER (XL) 150 MG PO TB24
150.0000 mg | ORAL_TABLET | Freq: Every day | ORAL | Status: DC
  Administered 2018-08-27: 11:00:00 150 mg via ORAL
  Filled 2018-08-26: qty 1

## 2018-08-26 MED ORDER — PAROXETINE HCL ER 25 MG PO TB24
25.0000 mg | ORAL_TABLET | Freq: Every day | ORAL | Status: DC
  Administered 2018-08-27: 11:00:00 25 mg via ORAL
  Filled 2018-08-26: qty 1

## 2018-08-26 MED ORDER — PROPOFOL 10 MG/ML IV BOLUS
INTRAVENOUS | Status: DC | PRN
  Administered 2018-08-26: 40 mg via INTRAVENOUS
  Administered 2018-08-26: 20 mg via INTRAVENOUS
  Administered 2018-08-26: 40 mg via INTRAVENOUS

## 2018-08-26 MED ORDER — ONDANSETRON HCL 4 MG PO TABS: 4.0000 mg | ORAL_TABLET | Freq: Four times a day (QID) | ORAL | Status: DC | PRN

## 2018-08-26 MED ORDER — BUPIVACAINE-EPINEPHRINE (PF) 0.5% -1:200000 IJ SOLN
INTRAMUSCULAR | Status: DC | PRN
  Administered 2018-08-26: 15 mL via PERINEURAL

## 2018-08-26 MED ORDER — FERROUS SULFATE 325 (65 FE) MG PO TABS
325.0000 mg | ORAL_TABLET | Freq: Two times a day (BID) | ORAL | Status: DC
  Administered 2018-08-26 – 2018-08-27 (×2): 325 mg via ORAL
  Filled 2018-08-26 (×2): qty 1

## 2018-08-26 MED ORDER — ACETAMINOPHEN 10 MG/ML IV SOLN: 1000.0000 mg | Freq: Once | INTRAVENOUS | Status: DC | PRN

## 2018-08-26 MED ORDER — METOCLOPRAMIDE HCL 5 MG/ML IJ SOLN
5.0000 mg | Freq: Three times a day (TID) | INTRAMUSCULAR | Status: DC | PRN
Start: 2018-08-26 — End: 2018-08-27

## 2018-08-26 MED ORDER — BUPIVACAINE-EPINEPHRINE (PF) 0.25% -1:200000 IJ SOLN
INTRAMUSCULAR | Status: AC
  Filled 2018-08-26: qty 30

## 2018-08-26 MED ORDER — SODIUM CHLORIDE 0.9 % IV SOLN
INTRAVENOUS | Status: DC
  Administered 2018-08-26 – 2018-08-27 (×2): via INTRAVENOUS

## 2018-08-26 MED ORDER — PHENOL 1.4 % MT LIQD: 1.0000 | OROMUCOSAL | Status: DC | PRN

## 2018-08-26 MED ORDER — LORATADINE 10 MG PO TABS
10.0000 mg | ORAL_TABLET | Freq: Every day | ORAL | Status: DC
  Filled 2018-08-26: qty 1

## 2018-08-26 MED ORDER — POLYETHYLENE GLYCOL 3350 17 G PO PACK
17.0000 g | PACK | Freq: Two times a day (BID) | ORAL | Status: DC
  Administered 2018-08-26 – 2018-08-27 (×2): 17 g via ORAL
  Filled 2018-08-26 (×2): qty 1

## 2018-08-26 MED ORDER — 0.9 % SODIUM CHLORIDE (POUR BTL) OPTIME
TOPICAL | Status: DC | PRN
  Administered 2018-08-26: 1000 mL

## 2018-08-26 MED ORDER — SODIUM CHLORIDE 0.9 % IR SOLN
Status: DC | PRN
  Administered 2018-08-26: 1000 mL

## 2018-08-26 MED ORDER — BISACODYL 10 MG RE SUPP: 10.0000 mg | Freq: Every day | RECTAL | Status: DC | PRN

## 2018-08-26 MED ORDER — MIDAZOLAM HCL 2 MG/2ML IJ SOLN
INTRAMUSCULAR | Status: AC
  Filled 2018-08-26: qty 2

## 2018-08-26 MED ORDER — OXYCODONE HCL 5 MG PO TABS: 10.0000 mg | ORAL_TABLET | ORAL | Status: DC | PRN

## 2018-08-26 MED ORDER — PANTOPRAZOLE SODIUM 40 MG PO TBEC
40.0000 mg | DELAYED_RELEASE_TABLET | Freq: Every day | ORAL | Status: DC
  Administered 2018-08-27: 06:00:00 40 mg via ORAL
  Filled 2018-08-26: qty 1

## 2018-08-26 MED ORDER — OXYCODONE HCL 5 MG PO TABS
5.0000 mg | ORAL_TABLET | ORAL | Status: DC | PRN
  Administered 2018-08-26 – 2018-08-27 (×2): 5 mg via ORAL
  Filled 2018-08-26 (×3): qty 1

## 2018-08-26 MED ORDER — FLUTICASONE FUROATE-VILANTEROL 100-25 MCG/INH IN AEPB
1.0000 | INHALATION_SPRAY | Freq: Every day | RESPIRATORY_TRACT | Status: DC
  Filled 2018-08-26: qty 28

## 2018-08-26 MED ORDER — ONDANSETRON HCL 4 MG/2ML IJ SOLN
INTRAMUSCULAR | Status: AC
  Filled 2018-08-26: qty 2

## 2018-08-26 MED ORDER — GABAPENTIN 300 MG PO CAPS
300.0000 mg | ORAL_CAPSULE | Freq: Three times a day (TID) | ORAL | Status: DC
  Administered 2018-08-26 – 2018-08-27 (×3): 300 mg via ORAL
  Filled 2018-08-26 (×3): qty 1

## 2018-08-26 MED ORDER — ASPIRIN 81 MG PO CHEW
81.0000 mg | CHEWABLE_TABLET | Freq: Two times a day (BID) | ORAL | Status: DC
  Administered 2018-08-26 – 2018-08-27 (×2): 81 mg via ORAL
  Filled 2018-08-26 (×2): qty 1

## 2018-08-26 MED ORDER — MENTHOL 3 MG MT LOZG: 1.0000 | LOZENGE | OROMUCOSAL | Status: DC | PRN

## 2018-08-26 MED ORDER — BUPIVACAINE-EPINEPHRINE (PF) 0.25% -1:200000 IJ SOLN
INTRAMUSCULAR | Status: DC | PRN
  Administered 2018-08-26: 30 mL

## 2018-08-26 MED ORDER — LOSARTAN POTASSIUM 50 MG PO TABS
50.0000 mg | ORAL_TABLET | Freq: Every day | ORAL | Status: DC
  Administered 2018-08-27: 50 mg via ORAL
  Filled 2018-08-26: qty 1

## 2018-08-26 MED ORDER — PROMETHAZINE HCL 25 MG/ML IJ SOLN: 6.2500 mg | INTRAMUSCULAR | Status: DC | PRN

## 2018-08-26 MED ORDER — ALBUTEROL SULFATE HFA 108 (90 BASE) MCG/ACT IN AERS: 2.0000 | INHALATION_SPRAY | Freq: Four times a day (QID) | RESPIRATORY_TRACT | Status: DC | PRN

## 2018-08-26 MED ORDER — HYDROCHLOROTHIAZIDE 25 MG PO TABS
25.0000 mg | ORAL_TABLET | Freq: Every day | ORAL | Status: DC
  Administered 2018-08-27: 25 mg via ORAL
  Filled 2018-08-26: qty 1

## 2018-08-26 MED ORDER — LEVOTHYROXINE SODIUM 75 MCG PO TABS
75.0000 ug | ORAL_TABLET | ORAL | Status: DC
  Administered 2018-08-27: 05:00:00 75 ug via ORAL
  Filled 2018-08-26: qty 1

## 2018-08-26 MED ORDER — PHENYLEPHRINE 40 MCG/ML (10ML) SYRINGE FOR IV PUSH (FOR BLOOD PRESSURE SUPPORT)
PREFILLED_SYRINGE | INTRAVENOUS | Status: AC
  Filled 2018-08-26: qty 10

## 2018-08-26 MED ORDER — HYDROMORPHONE HCL 1 MG/ML IJ SOLN: 0.5000 mg | INTRAMUSCULAR | Status: DC | PRN

## 2018-08-26 MED ORDER — ALBUTEROL SULFATE (2.5 MG/3ML) 0.083% IN NEBU: 2.5000 mg | INHALATION_SOLUTION | Freq: Four times a day (QID) | RESPIRATORY_TRACT | Status: DC | PRN

## 2018-08-26 MED ORDER — SODIUM CHLORIDE (PF) 0.9 % IJ SOLN
INTRAMUSCULAR | Status: DC | PRN
  Administered 2018-08-26: 30 mL

## 2018-08-26 MED ORDER — METOCLOPRAMIDE HCL 5 MG PO TABS: 5.0000 mg | ORAL_TABLET | Freq: Three times a day (TID) | ORAL | Status: DC | PRN

## 2018-08-26 MED ORDER — CEFAZOLIN SODIUM-DEXTROSE 2-4 GM/100ML-% IV SOLN
2.0000 g | Freq: Four times a day (QID) | INTRAVENOUS | Status: AC
  Administered 2018-08-26 (×2): 2 g via INTRAVENOUS
  Filled 2018-08-26 (×2): qty 100

## 2018-08-26 MED ORDER — CHLORHEXIDINE GLUCONATE 4 % EX LIQD: 60.0000 mL | Freq: Once | CUTANEOUS | Status: DC

## 2018-08-26 MED ORDER — FENTANYL CITRATE (PF) 100 MCG/2ML IJ SOLN
50.0000 ug | INTRAMUSCULAR | Status: DC
  Administered 2018-08-26: 09:00:00 100 ug via INTRAVENOUS
  Filled 2018-08-26: qty 2

## 2018-08-26 MED ORDER — TRANEXAMIC ACID-NACL 1000-0.7 MG/100ML-% IV SOLN
1000.0000 mg | INTRAVENOUS | Status: AC
  Administered 2018-08-26: 1000 mg via INTRAVENOUS
  Filled 2018-08-26: qty 100

## 2018-08-26 MED ORDER — POVIDONE-IODINE 10 % EX SWAB
2.0000 "application " | Freq: Once | CUTANEOUS | Status: AC
  Administered 2018-08-26: 2 via TOPICAL

## 2018-08-26 MED ORDER — LIDOCAINE 2% (20 MG/ML) 5 ML SYRINGE
INTRAMUSCULAR | Status: AC
  Filled 2018-08-26: qty 5

## 2018-08-26 MED ORDER — METHOCARBAMOL 1000 MG/10ML IJ SOLN
500.0000 mg | Freq: Four times a day (QID) | INTRAVENOUS | Status: DC | PRN
  Filled 2018-08-26: qty 5

## 2018-08-26 MED ORDER — DEXAMETHASONE SODIUM PHOSPHATE 10 MG/ML IJ SOLN
10.0000 mg | Freq: Once | INTRAMUSCULAR | Status: AC
  Administered 2018-08-26: 10 mg via INTRAVENOUS

## 2018-08-26 MED ORDER — MAGNESIUM CITRATE PO SOLN: 1.0000 | Freq: Once | ORAL | Status: DC | PRN

## 2018-08-26 MED ORDER — DEXAMETHASONE SODIUM PHOSPHATE 10 MG/ML IJ SOLN
INTRAMUSCULAR | Status: AC
  Filled 2018-08-26: qty 1

## 2018-08-26 MED ORDER — ONDANSETRON HCL 4 MG/2ML IJ SOLN
INTRAMUSCULAR | Status: DC | PRN
  Administered 2018-08-26: 4 mg via INTRAVENOUS

## 2018-08-26 MED ORDER — CEFAZOLIN SODIUM-DEXTROSE 2-4 GM/100ML-% IV SOLN
2.0000 g | INTRAVENOUS | Status: AC
  Administered 2018-08-26: 2 g via INTRAVENOUS
  Filled 2018-08-26: qty 100

## 2018-08-26 MED ORDER — PROPOFOL 500 MG/50ML IV EMUL
INTRAVENOUS | Status: DC | PRN
  Administered 2018-08-26: 50 ug/kg/min via INTRAVENOUS

## 2018-08-26 SURGICAL SUPPLY — 59 items
ATTUNE MED ANAT PAT 35 KNEE (Knees) ×1 IMPLANT
ATTUNE MED ANAT PAT 35MM KNEE (Knees) ×1 IMPLANT
ATTUNE PSFEM LTSZ5 NARCEM KNEE (Femur) ×2 IMPLANT
ATTUNE PSRP INSR SZ5 6 KNEE (Insert) ×1 IMPLANT
ATTUNE PSRP INSR SZ5 6MM KNEE (Insert) ×1 IMPLANT
BAG ZIPLOCK 12X15 (MISCELLANEOUS) ×2 IMPLANT
BANDAGE ACE 6X5 VEL STRL LF (GAUZE/BANDAGES/DRESSINGS) ×3 IMPLANT
BASEPLATE TIBIAL ROTATING SZ 4 (Knees) ×2 IMPLANT
BLADE SAW SGTL 11.0X1.19X90.0M (BLADE) IMPLANT
BLADE SAW SGTL 13.0X1.19X90.0M (BLADE) ×3 IMPLANT
BLADE SURG SZ10 CARB STEEL (BLADE) ×6 IMPLANT
BOWL SMART MIX CTS (DISPOSABLE) ×3 IMPLANT
CEMENT HV SMART SET (Cement) ×4 IMPLANT
COVER SURGICAL LIGHT HANDLE (MISCELLANEOUS) ×3 IMPLANT
COVER WAND RF STERILE (DRAPES) IMPLANT
CUFF TOURN SGL QUICK 34 (TOURNIQUET CUFF) ×2
CUFF TRNQT CYL 34X4.125X (TOURNIQUET CUFF) ×1 IMPLANT
DECANTER SPIKE VIAL GLASS SM (MISCELLANEOUS) ×6 IMPLANT
DERMABOND ADVANCED (GAUZE/BANDAGES/DRESSINGS) ×2
DERMABOND ADVANCED .7 DNX12 (GAUZE/BANDAGES/DRESSINGS) ×1 IMPLANT
DRAPE U-SHAPE 47X51 STRL (DRAPES) ×3 IMPLANT
DRESSING AQUACEL AG SP 3.5X10 (GAUZE/BANDAGES/DRESSINGS) ×1 IMPLANT
DRSG AQUACEL AG SP 3.5X10 (GAUZE/BANDAGES/DRESSINGS) ×3
DURAPREP 26ML APPLICATOR (WOUND CARE) ×6 IMPLANT
ELECT REM PT RETURN 15FT ADLT (MISCELLANEOUS) ×3 IMPLANT
GLOVE BIO SURGEON STRL SZ 6 (GLOVE) ×5 IMPLANT
GLOVE BIOGEL PI IND STRL 6.5 (GLOVE) ×1 IMPLANT
GLOVE BIOGEL PI IND STRL 7.5 (GLOVE) ×1 IMPLANT
GLOVE BIOGEL PI IND STRL 8.5 (GLOVE) ×1 IMPLANT
GLOVE BIOGEL PI INDICATOR 6.5 (GLOVE) ×2
GLOVE BIOGEL PI INDICATOR 7.5 (GLOVE) ×2
GLOVE BIOGEL PI INDICATOR 8.5 (GLOVE)
GLOVE ECLIPSE 8.0 STRL XLNG CF (GLOVE) ×1 IMPLANT
GLOVE ORTHO TXT STRL SZ7.5 (GLOVE) ×3 IMPLANT
GOWN STRL REUS W/ TWL LRG LVL3 (GOWN DISPOSABLE) ×1 IMPLANT
GOWN STRL REUS W/TWL 2XL LVL3 (GOWN DISPOSABLE) ×1 IMPLANT
GOWN STRL REUS W/TWL LRG LVL3 (GOWN DISPOSABLE) ×5 IMPLANT
HANDPIECE INTERPULSE COAX TIP (DISPOSABLE) ×2
HOLDER FOLEY CATH W/STRAP (MISCELLANEOUS) ×2 IMPLANT
KIT TURNOVER KIT A (KITS) ×2 IMPLANT
MANIFOLD NEPTUNE II (INSTRUMENTS) ×3 IMPLANT
NDL SAFETY ECLIPSE 18X1.5 (NEEDLE) IMPLANT
NEEDLE HYPO 18GX1.5 SHARP (NEEDLE) ×2
NS IRRIG 1000ML POUR BTL (IV SOLUTION) ×1 IMPLANT
PACK TOTAL KNEE CUSTOM (KITS) ×3 IMPLANT
PROTECTOR NERVE ULNAR (MISCELLANEOUS) ×3 IMPLANT
SET HNDPC FAN SPRY TIP SCT (DISPOSABLE) ×1 IMPLANT
SET PAD KNEE POSITIONER (MISCELLANEOUS) ×3 IMPLANT
SUT MNCRL AB 4-0 PS2 18 (SUTURE) ×3 IMPLANT
SUT STRATAFIX PDS+ 0 24IN (SUTURE) ×3 IMPLANT
SUT VIC AB 1 CT1 36 (SUTURE) ×3 IMPLANT
SUT VIC AB 2-0 CT1 27 (SUTURE) ×6
SUT VIC AB 2-0 CT1 TAPERPNT 27 (SUTURE) ×3 IMPLANT
SYR 3ML LL SCALE MARK (SYRINGE) ×3 IMPLANT
TRAY FOLEY MTR SLVR 14FR STAT (SET/KITS/TRAYS/PACK) ×2 IMPLANT
TRAY FOLEY MTR SLVR 16FR STAT (SET/KITS/TRAYS/PACK) ×1 IMPLANT
WATER STERILE IRR 1000ML POUR (IV SOLUTION) ×6 IMPLANT
WRAP KNEE MAXI GEL POST OP (GAUZE/BANDAGES/DRESSINGS) ×3 IMPLANT
YANKAUER SUCT BULB TIP 10FT TU (MISCELLANEOUS) ×3 IMPLANT

## 2018-08-26 NOTE — Progress Notes (Signed)
Assisted Dr. Daiva Huge with left, ultrasound guided, adductor canal block. Side rails up, monitors on throughout procedure. See vital signs in flow sheet. Tolerated Procedure well.

## 2018-08-26 NOTE — Interval H&P Note (Signed)
History and Physical Interval Note:  08/26/2018 8:57 AM  [name]  has presented today for surgery, with the diagnosis of Left knee osteoarthritis.  The various methods of treatment have been discussed with the patient and family. After consideration of risks, benefits and other options for treatment, the patient has consented to  Procedure(s) with comments: TOTAL KNEE ARTHROPLASTY (Left) - 70 mins as a surgical intervention.  The patient's history has been reviewed, patient examined, no change in status, stable for surgery.  I have reviewed the patient's chart and labs.  Questions were answered to the patient's satisfaction.     Mauri Pole

## 2018-08-26 NOTE — Anesthesia Procedure Notes (Signed)
Anesthesia Regional Block: Adductor canal block   Pre-Anesthetic Checklist: ,, timeout performed, Correct Patient, Correct Site, Correct Laterality, Correct Procedure, Correct Position, site marked, Risks and benefits discussed, pre-op evaluation,  At surgeon's request and post-op pain management  Laterality: Left  Prep: Maximum Sterile Barrier Precautions used, chloraprep       Needles:  Injection technique: Single-shot  Needle Type: Echogenic Stimulator Needle     Needle Length: 9cm  Needle Gauge: 22     Additional Needles:   Procedures:,,,, ultrasound used (permanent image in chart),,,,  Narrative:  Start time: 08/26/2018 9:15 AM End time: 08/26/2018 9:17 AM Injection made incrementally with aspirations every 5 mL.  Performed by: Personally  Anesthesiologist: Brennan Bailey, MD  Additional Notes: Risks, benefits, and alternative discussed. Patient gave consent for procedure. Patient prepped and draped in sterile fashion. Sedation administered, patient remains easily responsive to voice. Relevant anatomy identified with ultrasound guidance. Local anesthetic given in 5cc increments with no signs or symptoms of intravascular injection. No pain or paraesthesias with injection. Patient monitored throughout procedure with signs of LAST or immediate complications. Tolerated well. Ultrasound image placed in chart.  Tawny Asal, MD

## 2018-08-26 NOTE — Discharge Instructions (Signed)

## 2018-08-26 NOTE — Transfer of Care (Signed)
Immediate Anesthesia Transfer of Care Note  Patient: [name]  Procedure(s) Performed: Procedure(s) with comments: TOTAL KNEE ARTHROPLASTY (Left) - 70 mins  Patient Location: PACU  Anesthesia Type:MAC, Regional and Spinal  Level of Consciousness: Patient easily awoken, sedated, comfortable, cooperative, following commands, responds to stimulation.   Airway & Oxygen Therapy: Patient spontaneously breathing, ventilating well, oxygen via simple oxygen mask.  Post-op Assessment: Report given to PACU RN, vital signs reviewed and stable.   Post vital signs: Reviewed and stable.  Complications: No apparent anesthesia complications  Last Vitals:  Vitals Value Taken Time  BP 124/82 08/26/18 1147  Temp    Pulse 85 08/26/18 1149  Resp 19 08/26/18 1149  SpO2 98 % 08/26/18 1149  Vitals shown include unvalidated device data.  Last Pain:  Vitals:   08/26/18 0804  TempSrc:   PainSc: 3          Complications: No apparent anesthesia complications

## 2018-08-26 NOTE — Evaluation (Signed)
Physical Therapy Evaluation Patient Details Name: [name] MRN: [id number] DOB: 1945-09-03 Today's Date: 08/26/2018   History of Present Illness  73 yo female s/p L TKR on 08/26/18. PMH includes fatty liver, R THA, OCD, RA, depression, HLD, DMII, R RTC repair.  Clinical Impression  Pt presents with L knee pain, decreased L knee ROM, increased time and effort to perform mobility tasks, and decreased activity tolerance limited by pain. Pt to benefit from acute PT to address deficits. Pt ambulated 42 ft with RW with min guard assist, verbal cuing for form and safety provided throughout. Pt educated on ankle pumps (20/hour) to perform this afternoon/evening to increase circulation, to pt's tolerance and limited by pain. PT to progress mobility as tolerated, and will continue to follow acutely.        Follow Up Recommendations Follow surgeon's recommendation for DC plan and follow-up therapies;Supervision for mobility/OOB(OPPT starting Fri)    Equipment Recommendations  None recommended by PT    Recommendations for Other Services       Precautions / Restrictions Precautions Precautions: Fall Restrictions Weight Bearing Restrictions: No LLE Weight Bearing: Weight bearing as tolerated      Mobility  Bed Mobility Overal bed mobility: Needs Assistance Bed Mobility: Supine to Sit     Supine to sit: Min assist;HOB elevated     General bed mobility comments: Min assist for LLE lifting and translation to EOB, increased time and effort to perform.  Transfers Overall transfer level: Needs assistance Equipment used: Rolling walker (2 wheeled) Transfers: Sit to/from Stand Sit to Stand: Min guard;From elevated surface         General transfer comment: Min guard for safety. Verbal cuing for hand placement when rising. Pt able to shift weight L and R without difficulty.  Ambulation/Gait Ambulation/Gait assistance: Min guard Gait Distance (Feet): 75 Feet Assistive device:  Rolling walker (2 wheeled) Gait Pattern/deviations: Step-to pattern;Decreased step length - right;Decreased step length - left;Antalgic;Decreased weight shift to left Gait velocity: decr   General Gait Details: Min guard for safety. Verbal cuing for sequencing, placement in RW.  Stairs            Wheelchair Mobility    Modified Rankin (Stroke Patients Only)       Balance Overall balance assessment: Mild deficits observed, not formally tested                                           Pertinent Vitals/Pain Pain Assessment: 0-10 Pain Score: 3  Pain Location: L knee Pain Descriptors / Indicators: Sore Pain Intervention(s): Limited activity within patient's tolerance;Premedicated before session;Monitored during session;Repositioned;Ice applied    Home Living Family/patient expects to be discharged to:: Private residence Living Arrangements: Spouse/significant other Available Help at Discharge: Family;Available 24 hours/day(spouse; daughter flying in to assist) Type of Home: House Home Access: Stairs to enter Entrance Stairs-Rails: None Entrance Stairs-Number of Steps: 1 Home Layout: One level Home Equipment: Walker - 2 wheels;Bedside commode;Cane - single point;Shower seat      Prior Function Level of Independence: Independent               Hand Dominance   Dominant Hand: Right    Extremity/Trunk Assessment   Upper Extremity Assessment Upper Extremity Assessment: Overall WFL for tasks assessed    Lower Extremity Assessment Lower Extremity Assessment: Overall WFL for tasks assessed;LLE deficits/detail LLE Deficits / Details: suspected post-surgical  weakness; able to perform ankle pumps, quad set, heel slide, SLR with lift assist LLE Sensation: WNL    Cervical / Trunk Assessment Cervical / Trunk Assessment: Normal  Communication   Communication: No difficulties  Cognition Arousal/Alertness: Awake/alert Behavior During Therapy: WFL  for tasks assessed/performed Overall Cognitive Status: Within Functional Limits for tasks assessed                                        General Comments      Exercises Total Joint Exercises Goniometric ROM: knee aarom ~5-90*, limited by pain   Assessment/Plan    PT Assessment Patient needs continued PT services  PT Problem List Decreased strength;Decreased mobility;Decreased range of motion;Decreased activity tolerance;Decreased balance;Decreased knowledge of use of DME;Pain       PT Treatment Interventions DME instruction;Therapeutic activities;Gait training;Therapeutic exercise;Patient/family education;Balance training;Stair training;Functional mobility training    PT Goals (Current goals can be found in the Care Plan section)  Acute Rehab PT Goals PT Goal Formulation: With patient Time For Goal Achievement: 09/02/18 Potential to Achieve Goals: Good    Frequency 7X/week   Barriers to discharge        Co-evaluation               AM-PAC PT "6 Clicks" Mobility  Outcome Measure Help needed turning from your back to your side while in a flat bed without using bedrails?: A Little Help needed moving from lying on your back to sitting on the side of a flat bed without using bedrails?: A Little Help needed moving to and from a bed to a chair (including a wheelchair)?: A Little Help needed standing up from a chair using your arms (e.g., wheelchair or bedside chair)?: A Little Help needed to walk in hospital room?: A Little Help needed climbing 3-5 steps with a railing? : A Little 6 Click Score: 18    End of Session Equipment Utilized During Treatment: Gait belt Activity Tolerance: Patient tolerated treatment well Patient left: in chair;with call bell/phone within reach;with chair alarm set;with SCD's reapplied Nurse Communication: Mobility status PT Visit Diagnosis: Other abnormalities of gait and mobility (R26.89);Difficulty in walking, not elsewhere  classified (R26.2)    Time: 2426-8341 PT Time Calculation (min) (ACUTE ONLY): 23 min   Charges:   PT Evaluation $PT Eval Low Complexity: 1 Low PT Treatments $Gait Training: 8-22 mins       Julien Girt, PT Acute Rehabilitation Services Pager [phone]  Office [phone]  Carolan Avedisian D Elonda Husky 08/26/2018, 5:07 PM

## 2018-08-26 NOTE — Op Note (Signed)
NAME:  [name]                      MEDICAL RECORD NO.:  [id number]                             FACILITY:  [facility]      PHYSICIAN:  Pietro Cassis. Alvan Dame, M.D.  DATE OF BIRTH:  02-Sep-1945      DATE OF PROCEDURE:  08/26/2018                                     OPERATIVE REPORT         PREOPERATIVE DIAGNOSIS:  Left knee osteoarthritis.      POSTOPERATIVE DIAGNOSIS:  Left knee osteoarthritis.      FINDINGS:  The patient was noted to have complete loss of cartilage and   bone-on-bone arthritis with associated osteophytes in the medial and patellofemoral compartments of   the knee with a significant synovitis and associated effusion.  The patient had failed months of conservative treatment including medications, injection therapy, activity modification.     PROCEDURE:  Left total knee replacement.      COMPONENTS USED:  DePuy Attune rotating platform posterior stabilized knee   system, a size 5N femur, 4 tibia, size 6 mm PS AOX insert, and 35 anatomic patellar   button.      SURGEON:  Pietro Cassis. Alvan Dame, M.D.      ASSISTANT:  Griffith Citron, PA-C.      ANESTHESIA:  Regional and Spinal.      SPECIMENS:  None.      COMPLICATION:  None.      DRAINS:  None.  EBL: <100cc      TOURNIQUET TIME:  29 min at 250 mmHg   The patient was stable to the recovery room.      INDICATION FOR PROCEDURE:  [name] is a 73 y.o. female patient of   mine.  The patient had been seen, evaluated, and treated for months conservatively in the   office with medication, activity modification, and injections.  The patient had   radiographic changes of bone-on-bone arthritis with endplate sclerosis and osteophytes noted.  Based on the radiographic changes and failed conservative measures, the patient   decided to proceed with definitive treatment, total knee replacement.  Risks of infection, DVT, component failure, need for revision surgery, neurovascular injury were reviewed in the office setting.   The postop course was reviewed stressing the efforts to maximize post-operative satisfaction and function.  Consent was obtained for benefit of pain   relief.      PROCEDURE IN DETAIL:  The patient was brought to the operative theater.   Once adequate anesthesia, preoperative antibiotics, 2 gm of Ancef,1 gm of Tranexamic Acid, and 10 mg of Decadron administered, the patient was positioned supine with a left thigh tourniquet placed.  The  left lower extremity was prepped and draped in sterile fashion.  A time-   out was performed identifying the patient, planned procedure, and the appropriate extremity.      The left lower extremity was placed in the [facility] leg holder.  The leg was   exsanguinated, tourniquet elevated to 250 mmHg.  A midline incision was   made followed by median parapatellar arthrotomy.  Following initial   exposure, attention was first directed to the patella.  Precut   measurement was noted to be 23 mm.  I resected down to 13 mm and used a   35 anatomic patellar button to restore patellar height as well as cover the cut surface.      The lug holes were drilled and a metal shim was placed to protect the   patella from retractors and saw blade during the procedure.      At this point, attention was now directed to the femur.  The femoral   canal was opened with a drill, irrigated to try to prevent fat emboli.  An   intramedullary rod was passed at 3 degrees valgus, 9 mm of bone was   resected off the distal femur.  Following this resection, the tibia was   subluxated anteriorly.  Using the extramedullary guide, 2 mm of bone was resected off   the proximal medial tibia.  We confirmed the gap would be   stable medially and laterally with a size 5 spacer block as well as confirmed that the tibial cut was perpendicular in the coronal plane, checking with an alignment rod.      Once this was done, I sized the femur to be a size 5 in the anterior-   posterior dimension, chose a  narrow component based on medial and   lateral dimension.  The size 5 rotation block was then pinned in   position anterior referenced using the C-clamp to set rotation.  The   anterior, posterior, and  chamfer cuts were made without difficulty nor   notching making certain that I was along the anterior cortex to help   with flexion gap stability.      The final box cut was made off the lateral aspect of distal femur.      At this point, the tibia was sized to be a size 4.  The size 4 tray was   then pinned in position through the medial third of the tubercle,   drilled, and keel punched.  Trial reduction was now carried with a 5 femur,  4 tibia, a size 6 mm PS insert, and the 35 anatomic patella botton.  The knee was brought to full extension with good flexion stability with the patella   tracking through the trochlea without application of pressure.  Given   all these findings the trial components removed.  Final components were   opened and cement was mixed.  The knee was irrigated with normal saline solution and pulse lavage.  The synovial lining was   then injected with 30 cc of 0.25% Marcaine with epinephrine, 1 cc of Toradol and 30 cc of NS for a total of 61 cc.     Final implants were then cemented onto cleaned and dried cut surfaces of bone with the knee brought to extension with a size 6 mm PS trial insert.      Once the cement had fully cured, excess cement was removed   throughout the knee.  I confirmed that I was satisfied with the range of   motion and stability, and the final size 6 mm PS AOX insert was chosen.  It was   placed into the knee.      The tourniquet had been let down at 29 minutes.  No significant   hemostasis was required.  The extensor mechanism was then reapproximated using #1 Vicryl and #1 Stratafix sutures with the knee   in flexion.  The   remaining wound was closed with  2-0 Vicryl and running 4-0 Monocryl.   The knee was cleaned, dried, dressed  sterilely using Dermabond and   Aquacel dressing.  The patient was then   brought to recovery room in stable condition, tolerating the procedure   well.   Please note that Physician Assistant, Griffith Citron, PA-C was present for the entirety of the case, and was utilized for pre-operative positioning, peri-operative retractor management, general facilitation of the procedure and for primary wound closure at the end of the case.              Pietro Cassis Alvan Dame, M.D.    08/26/2018 9:59 AM

## 2018-08-26 NOTE — Anesthesia Procedure Notes (Signed)
Spinal  Patient location during procedure: OR Start time: 08/26/2018 9:58 AM End time: 08/26/2018 10:00 AM Staffing Anesthesiologist: Brennan Bailey, MD Performed: anesthesiologist  Preanesthetic Checklist Completed: patient identified, surgical consent, pre-op evaluation, timeout performed, IV checked, risks and benefits discussed and monitors and equipment checked Spinal Block Patient position: sitting Prep: site prepped and draped and DuraPrep Patient monitoring: cardiac monitor, continuous pulse ox and blood pressure Approach: midline Location: L3-4 Injection technique: single-shot Needle Needle type: Pencan  Needle gauge: 24 G Needle length: 9 cm Additional Notes Risks, benefits, and alternative discussed. Patient gave consent to procedure. Prepped and draped in sitting position. Clear CSF obtained after one needle redirection. No pain or paraesthesias with injection. Patient tolerated procedure well. Vital signs stable. Tawny Asal, MD

## 2018-08-26 NOTE — Anesthesia Postprocedure Evaluation (Signed)
Anesthesia Post Note  Patient: [name]  Procedure(s) Performed: TOTAL KNEE ARTHROPLASTY (Left Knee)     Patient location during evaluation: PACU Anesthesia Type: Spinal Level of consciousness: awake and alert Pain management: pain level controlled Vital Signs Assessment: post-procedure vital signs reviewed and stable Respiratory status: spontaneous breathing, nonlabored ventilation and respiratory function stable Cardiovascular status: blood pressure returned to baseline and stable Postop Assessment: no apparent nausea or vomiting and spinal receding Anesthetic complications: no    Last Vitals:  Vitals:   08/26/18 1245 08/26/18 1300  BP: 137/84 137/84  Pulse: 88 87  Resp: 16 16  Temp:    SpO2: 98% 97%    Last Pain:  Vitals:   08/26/18 1300  TempSrc:   PainSc: 0-No pain                 Brennan Bailey

## 2018-08-26 NOTE — Anesthesia Procedure Notes (Signed)
Procedure Name: MAC Date/Time: 08/26/2018 9:54 AM Performed by: Deliah Boston, CRNA Pre-anesthesia Checklist: Patient identified, Emergency Drugs available, Suction available and Patient being monitored Patient Re-evaluated:Patient Re-evaluated prior to induction Oxygen Delivery Method: Nasal cannula Preoxygenation: Pre-oxygenation with 100% oxygen Induction Type: IV induction Ventilation: Mask ventilation without difficulty Placement Confirmation: positive ETCO2 and breath sounds checked- equal and bilateral

## 2018-08-27 ENCOUNTER — Encounter (HOSPITAL_COMMUNITY): Payer: Self-pay | Admitting: Orthopedic Surgery

## 2018-08-27 DIAGNOSIS — K219 Gastro-esophageal reflux disease without esophagitis: Secondary | ICD-10-CM | POA: Diagnosis not present

## 2018-08-27 DIAGNOSIS — E039 Hypothyroidism, unspecified: Secondary | ICD-10-CM | POA: Diagnosis not present

## 2018-08-27 DIAGNOSIS — E663 Overweight: Secondary | ICD-10-CM | POA: Diagnosis present

## 2018-08-27 DIAGNOSIS — M25762 Osteophyte, left knee: Secondary | ICD-10-CM | POA: Diagnosis not present

## 2018-08-27 DIAGNOSIS — M1712 Unilateral primary osteoarthritis, left knee: Secondary | ICD-10-CM | POA: Diagnosis not present

## 2018-08-27 DIAGNOSIS — M659 Synovitis and tenosynovitis, unspecified: Secondary | ICD-10-CM | POA: Diagnosis not present

## 2018-08-27 DIAGNOSIS — J45909 Unspecified asthma, uncomplicated: Secondary | ICD-10-CM | POA: Diagnosis not present

## 2018-08-27 LAB — BASIC METABOLIC PANEL
Anion gap: 9 (ref 5–15)
BUN: 16 mg/dL (ref 8–23)
CO2: 26 mmol/L (ref 22–32)
Calcium: 8.4 mg/dL — ABNORMAL LOW (ref 8.9–10.3)
Chloride: 103 mmol/L (ref 98–111)
Creatinine, Ser: 0.72 mg/dL (ref 0.44–1.00)
GFR calc Af Amer: >60 mL/min (ref >60)
GFR calc non Af Amer: >60 mL/min (ref >60)
Glucose, Bld: 145 mg/dL — ABNORMAL HIGH (ref 70–99)
Potassium: 4 mmol/L (ref 3.5–5.1)
Sodium: 138 mmol/L (ref 135–145)

## 2018-08-27 LAB — CBC
HCT: 32.8 % — ABNORMAL LOW (ref 36.0–46.0)
Hemoglobin: 10.2 g/dL — ABNORMAL LOW (ref 12.0–15.0)
MCH: 28.3 pg (ref 26.0–34.0)
MCHC: 31.1 g/dL (ref 30.0–36.0)
MCV: 90.9 fL (ref 80.0–100.0)
Platelets: 286 10*3/uL (ref 150–400)
RBC: 3.61 MIL/uL — ABNORMAL LOW (ref 3.87–5.11)
RDW: 13.1 % (ref 11.5–15.5)
WBC: 11.2 10*3/uL — ABNORMAL HIGH (ref 4.0–10.5)
nRBC: 0 % (ref 0.0–0.2)

## 2018-08-27 MED ORDER — ASPIRIN 81 MG PO CHEW: 81.0000 mg | CHEWABLE_TABLET | Freq: Two times a day (BID) | ORAL | 0 refills | Status: AC

## 2018-08-27 MED ORDER — METHOCARBAMOL 500 MG PO TABS: 500.0000 mg | ORAL_TABLET | Freq: Four times a day (QID) | ORAL | 0 refills | Status: DC | PRN

## 2018-08-27 MED ORDER — OXYCODONE HCL 5 MG PO TABS: 5.0000 mg | ORAL_TABLET | ORAL | 0 refills | Status: DC | PRN

## 2018-08-27 MED ORDER — ACETAMINOPHEN 500 MG PO TABS: 1000.0000 mg | ORAL_TABLET | Freq: Three times a day (TID) | ORAL | 0 refills | Status: DC

## 2018-08-27 MED ORDER — DOCUSATE SODIUM 100 MG PO CAPS: 100.0000 mg | ORAL_CAPSULE | Freq: Two times a day (BID) | ORAL | 0 refills | Status: DC

## 2018-08-27 MED ORDER — GABAPENTIN 300 MG PO CAPS: 300.0000 mg | ORAL_CAPSULE | Freq: Three times a day (TID) | ORAL | 0 refills | Status: DC

## 2018-08-27 MED ORDER — POLYETHYLENE GLYCOL 3350 17 G PO PACK: 17.0000 g | PACK | Freq: Two times a day (BID) | ORAL | 0 refills | Status: DC

## 2018-08-27 MED ORDER — FERROUS SULFATE 325 (65 FE) MG PO TABS: 325.0000 mg | ORAL_TABLET | Freq: Three times a day (TID) | ORAL | 0 refills | Status: DC

## 2018-08-27 NOTE — Progress Notes (Signed)
Physical Therapy Treatment Patient Details Name: [name] MRN: [id number] DOB: 1945-07-19 Today's Date: 08/27/2018    History of Present Illness 73 yo female s/p L TKR on 08/26/18. PMH includes fatty liver, R THA, OCD, RA, depression, HLD, DMII, R RTC repair.    PT Comments    Patient progressing with ambulation distance/tolerance and able to negotiate step appropriate for home entry.  Also reviewed car transfers and handout for HEP given.  Stable for home with follow up PT as directed per MD.   Follow Up Recommendations  Follow surgeon's recommendation for DC plan and follow-up therapies     Equipment Recommendations  None recommended by PT    Recommendations for Other Services       Precautions / Restrictions Precautions Precautions: Fall;Knee Restrictions Weight Bearing Restrictions: No LLE Weight Bearing: Weight bearing as tolerated    Mobility  Bed Mobility   Bed Mobility: Supine to Sit     Supine to sit: Supervision     General bed mobility comments: increased time  Transfers Overall transfer level: Needs assistance Equipment used: Rolling walker (2 wheeled) Transfers: Sit to/from Stand Sit to Stand: Supervision         General transfer comment: patient verbally reminding herself to push up from bed, also up from 3:1 over toilet on her own  Ambulation/Gait Ambulation/Gait assistance: Supervision Gait Distance (Feet): 200 Feet Assistive device: Rolling walker (2 wheeled) Gait Pattern/deviations: Step-through pattern;Decreased stride length     General Gait Details: mild antalgia on L with increased ambulation   Stairs Stairs: Yes Stairs assistance: Min guard Stair Management: With walker;Forwards Number of Stairs: 1 General stair comments: cues for technique   Wheelchair Mobility    Modified Rankin (Stroke Patients Only)       Balance Overall balance assessment: Mild deficits observed, not formally tested                                           Cognition Arousal/Alertness: Awake/alert Behavior During Therapy: WFL for tasks assessed/performed Overall Cognitive Status: Within Functional Limits for tasks assessed                                        Exercises Total Joint Exercises Ankle Circles/Pumps: AROM;10 reps;Supine;Both Quad Sets: AROM;10 reps;Supine;Both Towel Squeeze: AROM;10 reps;Supine;Both Short Arc Quad: AROM;10 reps;Left;Supine Heel Slides: AROM;10 reps;Left;Supine Hip ABduction/ADduction: AROM;Left;10 reps;Supine Straight Leg Raises: AROM;Left;10 reps;Supine Long Arc Quad: AROM;10 reps;Seated;Left Knee Flexion: AROM;10 reps;Seated;Left Goniometric ROM: grossly 5-95    General Comments        Pertinent Vitals/Pain Pain Score: 3  Pain Location: L knee Pain Descriptors / Indicators: Sore Pain Intervention(s): Monitored during session;Repositioned;Ice applied    Home Living                      Prior Function            PT Goals (current goals can now be found in the care plan section) Progress towards PT goals: Progressing toward goals    Frequency    7X/week      PT Plan Current plan remains appropriate    Co-evaluation              AM-PAC PT "6 Clicks" Mobility   Outcome Measure  Help  needed turning from your back to your side while in a flat bed without using bedrails?: None Help needed moving from lying on your back to sitting on the side of a flat bed without using bedrails?: None Help needed moving to and from a bed to a chair (including a wheelchair)?: None Help needed standing up from a chair using your arms (e.g., wheelchair or bedside chair)?: None Help needed to walk in hospital room?: A Little Help needed climbing 3-5 steps with a railing? : A Little 6 Click Score: 22    End of Session Equipment Utilized During Treatment: Gait belt Activity Tolerance: Patient tolerated treatment well Patient left: in  bed;with call bell/phone within reach Nurse Communication: Other (comment)(stable for d/c) PT Visit Diagnosis: Other abnormalities of gait and mobility (R26.89);Difficulty in walking, not elsewhere classified (R26.2)     Time: 0935-1010 PT Time Calculation (min) (ACUTE ONLY): 35 min  Charges:  $Gait Training: 8-22 mins $Therapeutic Exercise: 8-22 mins                     Magda Kiel, [place] [facility] [phone] 08/27/2018    Reginia Naas 08/27/2018, 11:01 AM

## 2018-08-27 NOTE — Progress Notes (Signed)
Patient discharged to home w/ family. Given all belongings, instructions, equipment at home. Verbalized understanding of all instructions. Escorted to pov via w/c.

## 2018-08-27 NOTE — Progress Notes (Signed)
     Subjective: 1 Day Post-Op Procedure(s) (LRB): TOTAL KNEE ARTHROPLASTY (Left)   Patient reports pain as mild, pain controlled. No events throughout the night.  Feels that she is doing well and worked well with PT.  Ready to be discharged home.    Patient's anticipated LOS is less than 2 midnights, meeting these requirements: - Lives within 1 hour of care - Has a competent adult at home to recover with post-op recover - NO history of  - Chronic pain requiring opiods  - Heart failure  - Heart attack  - Stroke  - DVT/VTE  - Cardiac arrhythmia  - Respiratory Failure/COPD  - Renal failure  - Anemia  - Advanced Liver disease     Objective:   VITALS:   Vitals:   08/27/18 0034 08/27/18 0422  BP: 129/79 129/76  Pulse: 82 72  Resp: 16 16  Temp: 98 F (36.7 C) (!) 97.5 F (36.4 C)  SpO2: 95% 100%    Dorsiflexion/Plantar flexion intact Incision: dressing C/D/I No cellulitis present Compartment soft  LABS Recent Labs    08/27/18 0305  HGB 10.2*  HCT 32.8*  WBC 11.2*  PLT 286    Recent Labs    08/27/18 0305  NA 138  K 4.0  BUN 16  CREATININE 0.72  GLUCOSE 145*     Assessment/Plan: 1 Day Post-Op Procedure(s) (LRB): TOTAL KNEE ARTHROPLASTY (Left) Foley cath d/c'ed Advance diet Up with therapy D/C IV fluids Discharge home Follow up in 2 weeks at [facility]. Follow up with OLIN,Viki Carrera D in 2 weeks.  Contact information:  [facility] [address], [place] [phone]    Overweight (BMI 25-29.9) Estimated body mass index is 29.13 kg/m as calculated from the following:   Height as of this encounter: 5\' 7"  (1.702 m).   Weight as of this encounter: 84.4 kg. Patient also counseled that weight may inhibit the healing process Patient counseled that losing weight will help with future health issues       West Pugh. Tyjai Charbonnet   PAC  08/27/2018, 8:47 AM

## 2018-08-27 NOTE — Care Management Obs Status (Signed)
[place] NOTIFICATION   Patient Details  Name: Somara Frymire MRN: [id number] Date of Birth: [birth year]   Medicare Observation Status Notification Given:  Yes    Lynnell Catalan, RN 08/27/2018, 12:15 PM

## 2018-08-29 DIAGNOSIS — M25562 Pain in left knee: Secondary | ICD-10-CM | POA: Diagnosis not present

## 2018-09-02 DIAGNOSIS — M25562 Pain in left knee: Secondary | ICD-10-CM | POA: Diagnosis not present

## 2018-09-02 NOTE — Discharge Summary (Signed)
Physician Discharge Summary  Patient ID: [name] MRN: [id number] DOB/AGE: [birth year] 75 y.o.  Admit date: 08/26/2018 Discharge date: 08/27/2018   Procedures:  Procedure(s) (LRB): TOTAL KNEE ARTHROPLASTY (Left)  Attending Physician:  Dr. Paralee Cancel   Admission Diagnoses:   Left knee primary OA / pain  Discharge Diagnoses:  Principal Problem:   S/P left TKA Active Problems:   Overweight (BMI 25.0-29.9)  Past Medical History:  Diagnosis Date  . Allergic rhinitis, cause unspecified   . Anxiety   . Benign paroxysmal positional vertigo   . Cancer ([place])    basal cell carcinoma per right side of nostril  . CHF (congestive heart failure) ([place]) 11/09/2016   had 2+ edema in legs with anxiety and SOB  . Complication of anesthesia    has 4  degenerative discs in neck.  . Depressive disorder, not elsewhere classified   . Esophageal reflux   . History of hiatal hernia   . Hypertension   . Localized osteoarthrosis not specified whether primary or secondary, lower leg   . Other and unspecified hyperlipidemia   . Other diseases of vocal cords   . Peptic ulcer, unspecified site, unspecified as acute or chronic, without mention of hemorrhage, perforation, or obstruction   . Pneumonia    bilat pneumonia 1987  . Pre-diabetes    no meds,just diet controll  . Shortness of breath dyspnea    with anxiety; with climbing stairs   and asthma  . TMJ (temporomandibular joint disorder)   . Unspecified arthropathy, hand   . Unspecified asthma(493.90)    triggered with a virus   . Unspecified hypothyroidism     HPI:     [name], 73 y.o. female, has a history of pain and functional disability in the left knee due to arthritis and has failed non-surgical conservative treatments for greater than 12 weeks to include NSAID's and/or analgesics, corticosteriod injections and activity modification.  Onset of symptoms was gradual, starting 1-2 years ago with gradually worsening course  since that time. The patient noted prior procedures on the knee to include  arthroscopy on the left knee(s).  Patient currently rates pain in the left knee(s) at 5 out of 10 with activity. Patient has worsening of pain with activity and weight bearing, pain that interferes with activities of daily living, pain with passive range of motion, crepitus and joint swelling.  Patient has evidence of periarticular osteophytes and joint space narrowing by imaging studies. There is no active infection.  Risks, benefits and expectations were discussed with the patient.  Risks including but not limited to the risk of anesthesia, blood clots, nerve damage, blood vessel damage, failure of the prosthesis, infection and up to and including death.  Patient understand the risks, benefits and expectations and wishes to proceed with surgery.   PCP: Hali Marry, MD   Discharged Condition: good  Hospital Course:  Patient underwent the above stated procedure on 08/26/2018. Patient tolerated the procedure well and brought to the recovery room in good condition and subsequently to the floor.  POD #1 BP: 129/76 ; Pulse: 72 ; Temp: 97.5 F (36.4 C) ; Resp: 16 Patient reports pain as mild, pain controlled. No events throughout the night.  Feels that she is doing well and worked well with PT.  Ready to be discharged home.  Dorsiflexion/plantar flexion intact, incision: dressing C/D/I, no cellulitis present and compartment soft.   LABS  Basename    HGB     10.2  HCT  32.8    Discharge Exam: General appearance: alert, cooperative and no distress Extremities: Homans sign is negative, no sign of DVT, no edema, redness or tenderness in the calves or thighs and no ulcers, gangrene or trophic changes  Disposition:  Home with follow up in 2 weeks   Follow-up Information    Paralee Cancel, MD. Schedule an appointment as soon as possible for a visit in 2 weeks.   Specialty: Orthopedic Surgery Contact information:  [address] [place] [address] [phone]           Discharge Instructions    Call MD / Call 911   Complete by: As directed    If you experience chest pain or shortness of breath, CALL 911 and be transported to the hospital emergency room.  If you develope a fever above 101 F, pus (white drainage) or increased drainage or redness at the wound, or calf pain, call your surgeon's office.   Change dressing   Complete by: As directed    Maintain surgical dressing until follow up in the clinic. If the edges start to pull up, may reinforce with tape. If the dressing is no longer working, may remove and cover with gauze and tape, but must keep the area dry and clean.  Call with any questions or concerns.   Constipation Prevention   Complete by: As directed    Drink plenty of fluids.  Prune juice may be helpful.  You may use a stool softener, such as Colace (over the counter) 100 mg twice a day.  Use MiraLax (over the counter) for constipation as needed.   Diet - low sodium heart healthy   Complete by: As directed    Discharge instructions   Complete by: As directed    Maintain surgical dressing until follow up in the clinic. If the edges start to pull up, may reinforce with tape. If the dressing is no longer working, may remove and cover with gauze and tape, but must keep the area dry and clean.  Follow up in 2 weeks at [facility]. Call with any questions or concerns.   For Home Use Only DME CPM   Complete by: As directed    Laterality: Left Knee   Starting Flexion: 90   Ending Flexion: 120   Increase by Daily: 10   Surgery Date: 08/26/2018   CPM Started: 08/27/2018   Increase activity slowly as tolerated   Complete by: As directed    Weight bearing as tolerated with assist device (walker, cane, etc) as directed, use it as long as suggested by your surgeon or therapist, typically at least 4-6 weeks.   TED hose   Complete by: As directed    Use stockings  (TED hose) for 2 weeks on both leg(s).  You may remove them at night for sleeping.      Allergies as of 08/27/2018      Reactions   Astelin [azelastine Hcl] Other (See Comments)   Headache   Lisinopril Other (See Comments), Cough   ACE cough   Morphine Nausea Only   Moxifloxacin Other (See Comments)   REACTION: confusion, dizziness, paranoia      Medication List    STOP taking these medications   metroNIDAZOLE 1 % gel Commonly known as: Metrogel     TAKE these medications   acetaminophen 500 MG tablet Commonly known as: TYLENOL Take 2 tablets (1,000 mg total) by mouth every 8 (eight) hours.   albuterol 108 (90 Base)  MCG/ACT inhaler Commonly known as: Proventil HFA Inhale 2 puffs into the lungs every 6 (six) hours as needed for shortness of breath.   aspirin 81 MG chewable tablet Commonly known as: Aspirin Childrens Chew 1 tablet (81 mg total) by mouth 2 (two) times daily. Take for 4 weeks, then resume regular dose.   atorvastatin 10 MG tablet Commonly known as: LIPITOR Take 10 mg by mouth 3 (three) times a week.   buPROPion 150 MG 24 hr tablet Commonly known as: WELLBUTRIN XL Take 150 mg by mouth daily.   CALCIUM 600+D3 PO Take 1 tablet by mouth 2 (two) times a day.   docusate sodium 100 MG capsule Commonly known as: Colace Take 1 capsule (100 mg total) by mouth 2 (two) times daily.   famotidine 20 MG tablet Commonly known as: PEPCID Take 20 mg by mouth at bedtime.   ferrous sulfate 325 (65 FE) MG tablet Commonly known as: FerrouSul Take 1 tablet (325 mg total) by mouth 3 (three) times daily with meals for 14 days.   fexofenadine 180 MG tablet Commonly known as: ALLEGRA Take 180 mg by mouth at bedtime.   folic acid 1 MG tablet Commonly known as: FOLVITE Take 1 mg by mouth 2 (two) times daily.   gabapentin 300 MG capsule Commonly known as: Neurontin Take 1 capsule (300 mg total) by mouth 3 (three) times daily.   GENTEAL OP Place 1-2 drops into both  eyes 3 (three) times daily as needed (for dry eyes).   Ginkgo Biloba 80 MG Caps Take 400 mg by mouth daily.   hydrochlorothiazide 25 MG tablet Commonly known as: HYDRODIURIL TAKE 1 TABLET BY MOUTH EVERY DAY   L-Lysine HCl 500 MG Tabs Take 500 mg by mouth daily.   levothyroxine 75 MCG tablet Commonly known as: SYNTHROID Take 75 mcg by mouth See admin instructions. Take 75 mcg by mouth daily on all days except Sunday   levothyroxine 88 MCG tablet Commonly known as: SYNTHROID Take 88 mcg by mouth every Sunday.   losartan 50 MG tablet Commonly known as: COZAAR TAKE 1 TABLET BY MOUTH EVERY DAY   Magnesium 100 MG Tabs Take 150 mg by mouth 2 (two) times a day.   meloxicam 15 MG tablet Commonly known as: MOBIC TAKE 1 TABLET BY MOUTH EVERY MORNING What changed: when to take this   methocarbamol 500 MG tablet Commonly known as: Robaxin Take 1 tablet (500 mg total) by mouth every 6 (six) hours as needed for muscle spasms.   OVER THE COUNTER MEDICATION Take 1 tablet by mouth daily. Super Joint Support Supplement   oxyCODONE 5 MG immediate release tablet Commonly known as: Oxy IR/ROXICODONE Take 1-2 tablets (5-10 mg total) by mouth every 4 (four) hours as needed for moderate pain or severe pain.   pantoprazole 40 MG tablet Commonly known as: PROTONIX Take 1 tablet (40 mg total) by mouth daily.   PARoxetine 12.5 MG 24 hr tablet Commonly known as: PAXIL-CR Take 25 mg by mouth daily.   polyethylene glycol 17 g packet Commonly known as: MIRALAX / GLYCOLAX Take 17 g by mouth 2 (two) times daily.   Symbicort 80-4.5 MCG/ACT inhaler Generic drug: budesonide-formoterol INHALE 2 PUFFS TWICE A DAY What changed: when to take this   vitamin C with rose hips 1000 MG tablet Take 1,000 mg by mouth daily.   Vitamin D3 50 MCG (2000 UT) capsule Take 200 Units by mouth daily.  Durable Medical Equipment  (From admission, onward)         Start     Ordered    08/26/18 0000  For Home Use Only DME CPM    Question Answer Comment  Laterality Left Knee   Starting Flexion 90   Ending Flexion 120   Increase by Daily 10   Surgery Date 08/26/2018   CPM Started 08/27/2018      08/26/18 0812           Discharge Care Instructions  (From admission, onward)         Start     Ordered   08/27/18 0000  Change dressing    Comments: Maintain surgical dressing until follow up in the clinic. If the edges start to pull up, may reinforce with tape. If the dressing is no longer working, may remove and cover with gauze and tape, but must keep the area dry and clean.  Call with any questions or concerns.   08/27/18 0854           Signed: West Pugh. Allard Lightsey   PA-C  09/02/2018, 8:14 AM

## 2018-09-04 DIAGNOSIS — M25562 Pain in left knee: Secondary | ICD-10-CM | POA: Diagnosis not present

## 2018-09-08 DIAGNOSIS — M25562 Pain in left knee: Secondary | ICD-10-CM | POA: Diagnosis not present

## 2018-09-10 DIAGNOSIS — M25562 Pain in left knee: Secondary | ICD-10-CM | POA: Diagnosis not present

## 2018-09-12 DIAGNOSIS — M25562 Pain in left knee: Secondary | ICD-10-CM | POA: Diagnosis not present

## 2018-09-14 ENCOUNTER — Other Ambulatory Visit: Payer: Self-pay | Admitting: Family Medicine

## 2018-09-15 DIAGNOSIS — M25562 Pain in left knee: Secondary | ICD-10-CM | POA: Diagnosis not present

## 2018-09-16 DIAGNOSIS — F410 Panic disorder [episodic paroxysmal anxiety] without agoraphobia: Secondary | ICD-10-CM | POA: Diagnosis not present

## 2018-09-16 DIAGNOSIS — F422 Mixed obsessional thoughts and acts: Secondary | ICD-10-CM | POA: Diagnosis not present

## 2018-09-16 DIAGNOSIS — F902 Attention-deficit hyperactivity disorder, combined type: Secondary | ICD-10-CM | POA: Diagnosis not present

## 2018-09-17 ENCOUNTER — Other Ambulatory Visit: Payer: Self-pay

## 2018-09-17 DIAGNOSIS — M25562 Pain in left knee: Secondary | ICD-10-CM | POA: Diagnosis not present

## 2018-09-17 MED ORDER — MELOXICAM 15 MG PO TABS: 15.0000 mg | ORAL_TABLET | Freq: Every morning | ORAL | 1 refills | Status: DC

## 2018-09-18 DIAGNOSIS — F422 Mixed obsessional thoughts and acts: Secondary | ICD-10-CM | POA: Diagnosis not present

## 2018-09-18 DIAGNOSIS — F902 Attention-deficit hyperactivity disorder, combined type: Secondary | ICD-10-CM | POA: Diagnosis not present

## 2018-09-18 DIAGNOSIS — F410 Panic disorder [episodic paroxysmal anxiety] without agoraphobia: Secondary | ICD-10-CM | POA: Diagnosis not present

## 2018-09-19 DIAGNOSIS — M25562 Pain in left knee: Secondary | ICD-10-CM | POA: Diagnosis not present

## 2018-09-22 ENCOUNTER — Other Ambulatory Visit: Payer: Self-pay

## 2018-09-22 DIAGNOSIS — M25562 Pain in left knee: Secondary | ICD-10-CM | POA: Diagnosis not present

## 2018-09-22 MED ORDER — METRONIDAZOLE 1 % EX GEL: Freq: Every day | CUTANEOUS | 1 refills | Status: DC

## 2018-09-22 NOTE — Telephone Encounter (Signed)
Marcell called for a refill on Metrogel. It isn't on the current medication list. She states it last a long time and she hasn't needed a refill. Please advise.

## 2018-09-24 DIAGNOSIS — M25562 Pain in left knee: Secondary | ICD-10-CM | POA: Diagnosis not present

## 2018-09-26 DIAGNOSIS — M25562 Pain in left knee: Secondary | ICD-10-CM | POA: Diagnosis not present

## 2018-09-29 DIAGNOSIS — M25562 Pain in left knee: Secondary | ICD-10-CM | POA: Diagnosis not present

## 2018-10-01 ENCOUNTER — Other Ambulatory Visit (HOSPITAL_COMMUNITY): Payer: Self-pay | Admitting: Orthopedic Surgery

## 2018-10-01 DIAGNOSIS — M79662 Pain in left lower leg: Secondary | ICD-10-CM

## 2018-10-01 DIAGNOSIS — M25562 Pain in left knee: Secondary | ICD-10-CM | POA: Diagnosis not present

## 2018-10-01 DIAGNOSIS — M79605 Pain in left leg: Secondary | ICD-10-CM | POA: Diagnosis not present

## 2018-10-01 DIAGNOSIS — Z96652 Presence of left artificial knee joint: Secondary | ICD-10-CM | POA: Diagnosis not present

## 2018-10-01 DIAGNOSIS — M79661 Pain in right lower leg: Secondary | ICD-10-CM | POA: Diagnosis not present

## 2018-10-01 DIAGNOSIS — Z471 Aftercare following joint replacement surgery: Secondary | ICD-10-CM | POA: Diagnosis not present

## 2018-10-01 DIAGNOSIS — M79604 Pain in right leg: Secondary | ICD-10-CM | POA: Diagnosis not present

## 2018-10-02 ENCOUNTER — Ambulatory Visit (HOSPITAL_COMMUNITY)
Admission: RE | Admit: 2018-10-02 | Discharge: 2018-10-02 | Disposition: A | Discharge status: Home / Self Care | Payer: Medicare Other | Source: Ambulatory Visit | Attending: Cardiology | Admitting: Cardiology

## 2018-10-02 ENCOUNTER — Other Ambulatory Visit: Payer: Self-pay

## 2018-10-02 ENCOUNTER — Other Ambulatory Visit (HOSPITAL_COMMUNITY): Payer: Self-pay | Admitting: Orthopedic Surgery

## 2018-10-02 DIAGNOSIS — M79605 Pain in left leg: Secondary | ICD-10-CM | POA: Diagnosis not present

## 2018-10-02 DIAGNOSIS — M7989 Other specified soft tissue disorders: Secondary | ICD-10-CM | POA: Diagnosis not present

## 2018-10-02 DIAGNOSIS — M79662 Pain in left lower leg: Secondary | ICD-10-CM

## 2018-10-03 DIAGNOSIS — M25562 Pain in left knee: Secondary | ICD-10-CM | POA: Diagnosis not present

## 2018-10-14 NOTE — Progress Notes (Signed)
HPI The patient presents for evaluation of chest pain.  In the past she has had dyspnea and chest pain.  However, echocardiogram was unremarkable in 2015. There is no evidence of ischemia on stress perfusion study in 2017.  She had a negative POET (Plain Old Exercise Treadmill) in 2019 secondary to these symptoms and risk factors.     Since I last saw her in mid July she had left knee surgery.  She is going through physical therapy.  She continues to get some radiating discomfort coming from her epigastric up to the top of her chest occasionally.  This is a vague discomfort but she cannot quantify or qualify.  She had these complaints for some time.  She continues to get short of breath with activities such as taking a shower.  Again she has had an extensive work-up for this with multiple stress tests.  She had an echocardiogram.  He does not seem like her symptoms are changed from that time.  She has been very limited in her activities and is just now starting to get a little bit better as she goes through rehab.  She is not describing PND or orthopnea.  She is not describing new medications, presyncope or syncope.   Allergies  Allergen Reactions  . Astelin [Azelastine Hcl] Other (See Comments)    Headache  . Lisinopril Other (See Comments) and Cough    ACE cough  . Morphine Nausea Only  . Moxifloxacin Other (See Comments)    REACTION: confusion, dizziness, paranoia    Current Outpatient Medications  Medication Sig Dispense Refill  . albuterol (PROVENTIL HFA) 108 (90 Base) MCG/ACT inhaler Inhale 2 puffs into the lungs every 6 (six) hours as needed for shortness of breath. 6.7 g 5  . Ascorbic Acid (VITAMIN C WITH ROSE HIPS) 1000 MG tablet Take 1,000 mg by mouth daily.    Marland Kitchen atorvastatin (LIPITOR) 10 MG tablet Take 10 mg by mouth 3 (three) times a week.   3  . buPROPion (WELLBUTRIN XL) 150 MG 24 hr tablet Take 150 mg by mouth daily.     . Calcium Carb-Cholecalciferol (CALCIUM 600+D3 PO)  Take 1 tablet by mouth 2 (two) times a day.    . Carboxymethylcell-Hypromellose (GENTEAL OP) Place 1-2 drops into both eyes 3 (three) times daily as needed (for dry eyes).    . Cholecalciferol (VITAMIN D3) 50 MCG (2000 UT) capsule Take 200 Units by mouth daily.     . famotidine (PEPCID) 20 MG tablet Take 20 mg by mouth at bedtime.     . fexofenadine (ALLEGRA) 180 MG tablet Take 180 mg by mouth at bedtime.    . folic acid (FOLVITE) 1 MG tablet Take 1 mg by mouth 2 (two) times daily.      . Ginkgo Biloba 80 MG CAPS Take 400 mg by mouth daily.     . hydrochlorothiazide (HYDRODIURIL) 25 MG tablet TAKE 1 TABLET BY MOUTH EVERY DAY (Patient taking differently: Take 25 mg by mouth daily. ) 90 tablet 2  . L-Lysine HCl 500 MG TABS Take 500 mg by mouth daily.     Marland Kitchen levothyroxine (SYNTHROID, LEVOTHROID) 75 MCG tablet Take 75 mcg by mouth See admin instructions. Take 75 mcg by mouth daily on all days except Sunday    . levothyroxine (SYNTHROID, LEVOTHROID) 88 MCG tablet Take 88 mcg by mouth every Sunday.     . losartan (COZAAR) 50 MG tablet TAKE 1 TABLET BY MOUTH EVERY DAY (Patient taking  differently: Take 50 mg by mouth daily. ) 90 tablet 3  . Magnesium 100 MG TABS Take 150 mg by mouth 2 (two) times a day.    . meloxicam (MOBIC) 15 MG tablet Take 1 tablet (15 mg total) by mouth every morning. 90 tablet 1  . metroNIDAZOLE (METROGEL) 1 % gel Apply topically daily. (Patient taking differently: Apply 1 application topically as needed. ) 60 g 1  . OVER THE COUNTER MEDICATION Take 1 tablet by mouth daily. Super Joint Support Supplement    . oxyCODONE (OXY IR/ROXICODONE) 5 MG immediate release tablet Take 1-2 tablets (5-10 mg total) by mouth every 4 (four) hours as needed for moderate pain or severe pain. 60 tablet 0  . pantoprazole (PROTONIX) 40 MG tablet Take 1 tablet (40 mg total) by mouth daily. 90 tablet 3  . PARoxetine (PAXIL-CR) 12.5 MG 24 hr tablet Take 25 mg by mouth 2 (two) times daily.     . polyethylene  glycol (MIRALAX / GLYCOLAX) 17 g packet Take 17 g by mouth 2 (two) times daily. 28 packet 0  . SYMBICORT 80-4.5 MCG/ACT inhaler INHALE 2 PUFFS TWICE A DAY (Patient taking differently: Inhale 2 puffs into the lungs daily. ) 30.6 Inhaler 3   No current facility-administered medications for this visit.     Past Medical History:  Diagnosis Date  . Allergic rhinitis, cause unspecified   . Anxiety   . Benign paroxysmal positional vertigo   . Cancer ([place])    basal cell carcinoma per right side of nostril  . CHF (congestive heart failure) ([place]) 11/09/2016   had 2+ edema in legs with anxiety and SOB  . Complication of anesthesia    has 4  degenerative discs in neck.  . Depressive disorder, not elsewhere classified   . Esophageal reflux   . History of hiatal hernia   . Hypertension   . Localized osteoarthrosis not specified whether primary or secondary, lower leg   . Other and unspecified hyperlipidemia   . Other diseases of vocal cords   . Peptic ulcer, unspecified site, unspecified as acute or chronic, without mention of hemorrhage, perforation, or obstruction   . Pneumonia    bilat pneumonia 1987  . Pre-diabetes    no meds,just diet controll  . Shortness of breath dyspnea    with anxiety; with climbing stairs   and asthma  . TMJ (temporomandibular joint disorder)   . Unspecified arthropathy, hand   . Unspecified asthma(493.90)    triggered with a virus   . Unspecified hypothyroidism     Past Surgical History:  Procedure Laterality Date  . BREAST BIOPSY  09/07/2011   High Risk Lesion  . BREAST EXCISIONAL BIOPSY Left   . BREAST SURGERY     LUMPECTOMY / LEFT 10/12/2011  . KNEE ARTHROSCOPY     bilat   . NASAL SINUS SURGERY    . ROTATOR CUFF REPAIR     right   . thumb surgery      left hand 45 years ago   . TOTAL HIP ARTHROPLASTY Right 08/10/2014   Procedure: RIGHT TOTAL HIP ARTHROPLASTY ANTERIOR APPROACH;  Surgeon: Paralee Cancel, MD;  Location: [facility];  Service: Orthopedics;   Laterality: Right;  . TOTAL KNEE ARTHROPLASTY Left 08/26/2018   Procedure: TOTAL KNEE ARTHROPLASTY;  Surgeon: Paralee Cancel, MD;  Location: [facility];  Service: Orthopedics;  Laterality: Left;  70 mins    ROS:   As stated in the HPI and negative for all other systems.  PHYSICAL EXAM BP 112/64 (BP Location: Left Arm, Patient Position: Sitting, Cuff Size: Normal)   Pulse 81   Temp (!) 97.1 F (36.2 C)   Ht 5\' 2"  (1.575 m)   Wt 187 lb (84.8 kg)   BMI 34.20 kg/m  GENERAL:  Well appearing NECK:  No jugular venous distention, waveform within normal limits, carotid upstroke brisk and symmetric, no bruits, no thyromegaly LUNGS:  Clear to auscultation bilaterally CHEST:  Unremarkable HEART:  PMI not displaced or sustained,S1 and S2 within normal limits, no S3, no S4, no clicks, no rubs, no murmurs ABD:  Flat, positive bowel sounds normal in frequency in pitch, no bruits, no rebound, no guarding, no midline pulsatile mass, no hepatomegaly, no splenomegaly EXT:  2 plus pulses throughout, no edema, no cyanosis no clubbing   EKG:  Sinus rhythm, rate 84, left axis deviation, left atrial enlargement, RSR prime V1 and V2, no change from previous.  07/24/18  Lab Results  Component Value Date   CHOL 195 08/07/2018   TRIG 110 08/07/2018   HDL 59 08/07/2018   LDLCALC 126 08/07/2018   LDLDIRECT 146.7 09/18/2006     ASSESSMENT AND PLAN   CHEST PAIN:   Her chest pain is atypical.  Is been evaluated extensively in the past.  There is no objective evidence of ischemia on multiple stress test.  I do not think we need to do another 1 of these as the pattern has not changed and again there is no objective evidence of ischemia.  I would pursue this with her primary provider and consider GI etiology if it continues.    DYSPNEA:    This is been her biggest complaint and appears  to be baseline.  She does not seem to have volume overload.  This could be weight and deconditioning as she has been limited  orthopedically.  We talked about this at length and I gave her specifics about exercise and to let me know with exercise and weight loss whether this gets worse or better.   AORTIC ATHEROSCLEROSIS:   This was noted previously on CT.  Of note she has not had evidence of coronary calcification however.  She will continue with risk reduction.   DM:  Most recent A1C is 6.5%.  I will defer to her primary provider.   DYSLIPIDEMIA:  Lipids are above.  I would like her to get a fasting lipid profile when she sees her primary provider next with a goal LDL I think less than 100.  I do not see in the recent 1 and the LDL was 119 with an HDL of 58.  For now we will continue her meds as listed.

## 2018-10-16 ENCOUNTER — Other Ambulatory Visit: Payer: Self-pay

## 2018-10-16 ENCOUNTER — Ambulatory Visit (INDEPENDENT_AMBULATORY_CARE_PROVIDER_SITE_OTHER): Payer: Medicare Other | Admitting: Cardiology

## 2018-10-16 ENCOUNTER — Encounter: Payer: Self-pay | Admitting: Cardiology

## 2018-10-16 VITALS — BP 112/64 | HR 81 | Temp 97.1°F | Ht 62.0 in | Wt 187.0 lb

## 2018-10-16 DIAGNOSIS — I700 Atherosclerosis of aorta: Secondary | ICD-10-CM

## 2018-10-16 DIAGNOSIS — R0602 Shortness of breath: Secondary | ICD-10-CM

## 2018-10-16 DIAGNOSIS — R0789 Other chest pain: Secondary | ICD-10-CM | POA: Diagnosis not present

## 2018-10-16 DIAGNOSIS — R079 Chest pain, unspecified: Secondary | ICD-10-CM

## 2018-10-16 DIAGNOSIS — E118 Type 2 diabetes mellitus with unspecified complications: Secondary | ICD-10-CM

## 2018-10-16 NOTE — Patient Instructions (Signed)
Medication Instructions:  Your physician recommends that you continue on your current medications as directed. Please refer to the Current Medication list given to you today.  If you need a refill on your cardiac medications before your next appointment, please call your pharmacy.   Lab work: NONE If you have labs (blood work) drawn today and your tests are completely normal, you will receive your results only by: [place] (if you have MyChart) OR A paper copy in the mail If you have any lab test that is abnormal or we need to change your treatment, we will call you to review the results.  Testing/Procedures: NONE  Follow-Up: At [facility], you and your health needs are our priority.  As part of our continuing mission to provide you with exceptional heart care, we have created designated Provider Care Teams.  These Care Teams include your primary Cardiologist (physician) and Advanced Practice Providers (APPs -  Physician Assistants and Nurse Practitioners) who all work together to provide you with the care you need, when you need it. You will need a follow up appointment in 12 months.  Please call our office 2 months in advance to schedule this appointment.  You may see Minus Breeding, MD or one of the following Advanced Practice Providers on your designated Care Team:   Rosaria Ferries, PA-C Jory Sims, DNP, ANP

## 2018-10-22 DIAGNOSIS — F422 Mixed obsessional thoughts and acts: Secondary | ICD-10-CM | POA: Diagnosis not present

## 2018-10-22 DIAGNOSIS — F902 Attention-deficit hyperactivity disorder, combined type: Secondary | ICD-10-CM | POA: Diagnosis not present

## 2018-10-22 DIAGNOSIS — F410 Panic disorder [episodic paroxysmal anxiety] without agoraphobia: Secondary | ICD-10-CM | POA: Diagnosis not present

## 2018-11-04 DIAGNOSIS — F410 Panic disorder [episodic paroxysmal anxiety] without agoraphobia: Secondary | ICD-10-CM | POA: Diagnosis not present

## 2018-11-04 DIAGNOSIS — F422 Mixed obsessional thoughts and acts: Secondary | ICD-10-CM | POA: Diagnosis not present

## 2018-11-04 DIAGNOSIS — F902 Attention-deficit hyperactivity disorder, combined type: Secondary | ICD-10-CM | POA: Diagnosis not present

## 2018-11-07 ENCOUNTER — Encounter: Payer: Self-pay | Admitting: Family Medicine

## 2018-11-07 ENCOUNTER — Other Ambulatory Visit: Payer: Self-pay

## 2018-11-07 ENCOUNTER — Ambulatory Visit (INDEPENDENT_AMBULATORY_CARE_PROVIDER_SITE_OTHER): Payer: Medicare Other | Admitting: Family Medicine

## 2018-11-07 VITALS — BP 132/67 | HR 81 | Ht 62.0 in | Wt 186.0 lb

## 2018-11-07 DIAGNOSIS — R51 Headache: Secondary | ICD-10-CM

## 2018-11-07 DIAGNOSIS — L609 Nail disorder, unspecified: Secondary | ICD-10-CM

## 2018-11-07 DIAGNOSIS — I10 Essential (primary) hypertension: Secondary | ICD-10-CM

## 2018-11-07 DIAGNOSIS — M05742 Rheumatoid arthritis with rheumatoid factor of left hand without organ or systems involvement: Secondary | ICD-10-CM | POA: Diagnosis not present

## 2018-11-07 DIAGNOSIS — M5136 Other intervertebral disc degeneration, lumbar region: Secondary | ICD-10-CM

## 2018-11-07 DIAGNOSIS — M05741 Rheumatoid arthritis with rheumatoid factor of right hand without organ or systems involvement: Secondary | ICD-10-CM

## 2018-11-07 DIAGNOSIS — F422 Mixed obsessional thoughts and acts: Secondary | ICD-10-CM | POA: Diagnosis not present

## 2018-11-07 DIAGNOSIS — M542 Cervicalgia: Secondary | ICD-10-CM | POA: Insufficient documentation

## 2018-11-07 DIAGNOSIS — R519 Headache, unspecified: Secondary | ICD-10-CM

## 2018-11-07 NOTE — Assessment & Plan Note (Signed)
See note about about low back pain.

## 2018-11-07 NOTE — Assessment & Plan Note (Addendum)
Discussed tx options and discussed trying to use non-narcotic tx.  Given H.O with stretches. She doesn't do any routine exercise.  She is on Paxil for OCD and is not interested in changing to Cymbalta for pain.  Discussed prn steroids since she has RA.  She is already on NSAID.  Has topical Volteran gel for joints but studies don't show effectiveness for  Back pain.  She had talked to Dr. Nelva Bush at one point about cervical injection. Could consider tramadol but still possibility for dependency.  Narcotics would be last option.  If COVID risk improves then consider restarting chiropractic care which was helpful for her.  Ok to use Muscle relaxer prn. She has some methocarbamol.

## 2018-11-07 NOTE — Assessment & Plan Note (Signed)
Declines any DMARDs. Does not feel comfortable with the side effect profiles. Did discuss occassional use of prednisone if needed.

## 2018-11-07 NOTE — Progress Notes (Signed)
Established Patient Office Visit  Subjective:  Patient ID: [name], female    DOB: [birth year]  Age: 73 y.o. MRN: [id number]  CC:  Chief Complaint  Patient presents with  . Referral    HPI [name] presents for   Hypertension- Pt denies chest pain, SOB, dizziness, or heart palpitations.  Taking meds as directed w/o problems.  Denies medication side effects.    Would like new podiatry referral for nail care.  Her previous podiatrist is no longer coming to [place].  Used to see a chiropractor every other week before COVID for low back pain. She has degenerative discs in her low back. She also has neck pain and stiffness. She uses Biofreeze. Tylenol doesn't help.  She does take mobic 15mg  daily.  She used some of her oxycodone left over from her knee surgery ans says it really helped her pain today. She has a hx of RA and has chosen after working with Rheumatology to not be on any biologic drugs. She was previously on MTX. She wants to discuss pain medication as a treatment options.  She is particularly worried about meds that can affect her liver.   She also c/o of sensitivity of the temple on the left side.  Wonders if could be her shampoo. She is worried about arteritis as well. No vision changes.  It has been present for a few days. No HAs.    Past Medical History:  Diagnosis Date  . Allergic rhinitis, cause unspecified   . Anxiety   . Benign paroxysmal positional vertigo   . Cancer ([place])    basal cell carcinoma per right side of nostril  . CHF (congestive heart failure) ([place]) 11/09/2016   had 2+ edema in legs with anxiety and SOB  . Complication of anesthesia    has 4  degenerative discs in neck.  . Depressive disorder, not elsewhere classified   . Esophageal reflux   . History of hiatal hernia   . Hypertension   . Localized osteoarthrosis not specified whether primary or secondary, lower leg   . Other and unspecified hyperlipidemia   . Other diseases  of vocal cords   . Peptic ulcer, unspecified site, unspecified as acute or chronic, without mention of hemorrhage, perforation, or obstruction   . Pneumonia    bilat pneumonia 1987  . Pre-diabetes    no meds,just diet controll  . TMJ (temporomandibular joint disorder)   . Unspecified arthropathy, hand   . Unspecified asthma(493.90)    triggered with a virus   . Unspecified hypothyroidism     Past Surgical History:  Procedure Laterality Date  . BREAST BIOPSY  09/07/2011   High Risk Lesion  . BREAST EXCISIONAL BIOPSY Left   . BREAST SURGERY     LUMPECTOMY / LEFT 10/12/2011  . KNEE ARTHROSCOPY     bilat   . NASAL SINUS SURGERY    . ROTATOR CUFF REPAIR     right   . thumb surgery      left hand 45 years ago   . TOTAL HIP ARTHROPLASTY Right 08/10/2014   Procedure: RIGHT TOTAL HIP ARTHROPLASTY ANTERIOR APPROACH;  Surgeon: Paralee Cancel, MD;  Location[facility];  Service: Orthopedics;  Laterality: Right;  . TOTAL KNEE ARTHROPLASTY Left 08/26/2018   Procedure: TOTAL KNEE ARTHROPLASTY;  Surgeon: Paralee Cancel, MD;  Location: [facility];  Service: Orthopedics;  Laterality: Left;  70 mins    Family History  Problem Relation Age of Onset  . Heart disease Father   .  Colon cancer Mother   . Liver cancer Brother   . Asthma Brother     Social History   Socioeconomic History  . Marital status: Married    Spouse name: Not on file  . Number of children: 3  . Years of education: Not on file  . Highest education level: Not on file  Occupational History  . Occupation: Retired  Scientific laboratory technician  . Financial resource strain: Not on file  . Food insecurity    Worry: Not on file    Inability: Not on file  . Transportation needs    Medical: Not on file    Non-medical: Not on file  Tobacco Use  . Smoking status: Former Smoker    Packs/day: 0.30    Years: 2.00    Pack years: 0.60    Types: Cigarettes    Quit date: 02/12/1958    Years since quitting: 60.7  . Smokeless tobacco: Never Used   Substance and Sexual Activity  . Alcohol use: Yes    Comment: wine socially  . Drug use: No  . Sexual activity: Yes  Lifestyle  . Physical activity    Days per week: Not on file    Minutes per session: Not on file  . Stress: Not on file  Relationships  . Social Herbalist on phone: Not on file    Gets together: Not on file    Attends religious service: Not on file    Active member of club or organization: Not on file    Attends meetings of clubs or organizations: Not on file    Relationship status: Not on file  . Intimate partner violence    Fear of current or ex partner: Not on file    Emotionally abused: Not on file    Physically abused: Not on file    Forced sexual activity: Not on file  Other Topics Concern  . Not on file  Social History Narrative  . Not on file    Outpatient Medications Prior to Visit  Medication Sig Dispense Refill  . albuterol (PROVENTIL HFA) 108 (90 Base) MCG/ACT inhaler Inhale 2 puffs into the lungs every 6 (six) hours as needed for shortness of breath. 6.7 g 5  . Ascorbic Acid (VITAMIN C WITH ROSE HIPS) 1000 MG tablet Take 1,000 mg by mouth daily.    Marland Kitchen atorvastatin (LIPITOR) 10 MG tablet Take 10 mg by mouth 3 (three) times a week.   3  . buPROPion (WELLBUTRIN XL) 150 MG 24 hr tablet Take 150 mg by mouth daily.     . Calcium Carb-Cholecalciferol (CALCIUM 600+D3 PO) Take 1 tablet by mouth 2 (two) times a day.    . Carboxymethylcell-Hypromellose (GENTEAL OP) Place 1-2 drops into both eyes 3 (three) times daily as needed (for dry eyes).    . Cholecalciferol (VITAMIN D3) 50 MCG (2000 UT) capsule Take 200 Units by mouth daily.     . famotidine (PEPCID) 20 MG tablet Take 20 mg by mouth at bedtime.     . fexofenadine (ALLEGRA) 180 MG tablet Take 180 mg by mouth at bedtime.    . folic acid (FOLVITE) 1 MG tablet Take 1 mg by mouth 2 (two) times daily.      . Ginkgo Biloba 80 MG CAPS Take 400 mg by mouth daily.     . hydrochlorothiazide  (HYDRODIURIL) 25 MG tablet TAKE 1 TABLET BY MOUTH EVERY DAY (Patient taking differently: Take 25 mg by mouth daily. ) 90  tablet 2  . L-Lysine HCl 500 MG TABS Take 500 mg by mouth daily.     Marland Kitchen levothyroxine (SYNTHROID, LEVOTHROID) 75 MCG tablet Take 75 mcg by mouth See admin instructions. Take 75 mcg by mouth daily on all days except Sunday    . levothyroxine (SYNTHROID, LEVOTHROID) 88 MCG tablet Take 88 mcg by mouth every Sunday.     . losartan (COZAAR) 50 MG tablet TAKE 1 TABLET BY MOUTH EVERY DAY (Patient taking differently: Take 50 mg by mouth daily. ) 90 tablet 3  . Magnesium 100 MG TABS Take 150 mg by mouth 2 (two) times a day.    . meloxicam (MOBIC) 15 MG tablet Take 1 tablet (15 mg total) by mouth every morning. 90 tablet 1  . Methocarbamol (ROBAXIN PO) Take by mouth.    . metroNIDAZOLE (METROGEL) 1 % gel Apply topically daily. (Patient taking differently: Apply 1 application topically as needed. ) 60 g 1  . OVER THE COUNTER MEDICATION Take 1 tablet by mouth daily. Super Joint Support Supplement    . oxyCODONE (OXY IR/ROXICODONE) 5 MG immediate release tablet Take 1 tablet by mouth daily as needed for pain.    . pantoprazole (PROTONIX) 40 MG tablet Take 1 tablet (40 mg total) by mouth daily. 90 tablet 3  . PARoxetine (PAXIL-CR) 12.5 MG 24 hr tablet Take 25 mg by mouth 2 (two) times daily.     . polyethylene glycol (MIRALAX / GLYCOLAX) 17 g packet Take 17 g by mouth 2 (two) times daily. 28 packet 0  . Pseudoephedrine-Guaifenesin (MUCINEX D MAX STRENGTH) 6474621255 MG TB12 Take by mouth.    . SYMBICORT 80-4.5 MCG/ACT inhaler INHALE 2 PUFFS TWICE A DAY (Patient taking differently: Inhale 2 puffs into the lungs daily. ) 30.6 Inhaler 3  . oxyCODONE (OXY IR/ROXICODONE) 5 MG immediate release tablet Take 1-2 tablets (5-10 mg total) by mouth every 4 (four) hours as needed for moderate pain or severe pain. 60 tablet 0   No facility-administered medications prior to visit.     Allergies  Allergen  Reactions  . Astelin [Azelastine Hcl] Other (See Comments)    Headache  . Lisinopril Other (See Comments) and Cough    ACE cough  . Methotrexate Derivatives Other (See Comments)  . Morphine Nausea Only  . Moxifloxacin Other (See Comments)    REACTION: confusion, dizziness, paranoia    ROS Review of Systems    Objective:    Physical Exam  Constitutional: She is oriented to person, place, and time. She appears well-developed and well-nourished.  HENT:  Head: Normocephalic and atraumatic.  Cardiovascular: Normal rate, regular rhythm and normal heart sounds.  Pulmonary/Chest: Effort normal and breath sounds normal.  Neurological: She is alert and oriented to person, place, and time.  Skin: Skin is warm and dry.  Psychiatric: She has a normal mood and affect. Her behavior is normal.    BP 132/67   Pulse 81   Ht 5\' 2"  (1.575 m)   Wt 186 lb (84.4 kg)   SpO2 98%   BMI 34.02 kg/m  Wt Readings from Last 3 Encounters:  11/07/18 186 lb (84.4 kg)  10/16/18 187 lb (84.8 kg)  08/26/18 186 lb (84.4 kg)     Health Maintenance Due  Topic Date Due  . COLONOSCOPY  03/24/2017  . INFLUENZA VACCINE  09/13/2018    There are no preventive care reminders to display for this patient.  Lab Results  Component Value Date   TSH 0.48 08/07/2018  Lab Results  Component Value Date   WBC 11.2 (H) 08/27/2018   HGB 10.2 (L) 08/27/2018   HCT 32.8 (L) 08/27/2018   MCV 90.9 08/27/2018   PLT 286 08/27/2018   Lab Results  Component Value Date   NA 138 08/27/2018   K 4.0 08/27/2018   CO2 26 08/27/2018   GLUCOSE 145 (H) 08/27/2018   BUN 16 08/27/2018   CREATININE 0.72 08/27/2018   BILITOT 0.5 11/07/2016   ALKPHOS 57 08/07/2018   AST 15 08/07/2018   ALT 28 08/07/2018   PROT 6.0 (L) 11/07/2016   ALBUMIN 4.0 01/16/2016   CALCIUM 8.4 (L) 08/27/2018   ANIONGAP 9 08/27/2018   Lab Results  Component Value Date   CHOL 195 08/07/2018   Lab Results  Component Value Date   HDL 59  08/07/2018   Lab Results  Component Value Date   LDLCALC 126 08/07/2018   Lab Results  Component Value Date   TRIG 110 08/07/2018   Lab Results  Component Value Date   CHOLHDL 3.6 CALC 09/18/2006   Lab Results  Component Value Date   HGBA1C 6.2 (H) 08/22/2018      Assessment & Plan:   Problem List Items Addressed This Visit      Cardiovascular and Mediastinum   Essential hypertension, benign - Primary    Well controlled. Continue current regimen. Follow up in  6 mo      Relevant Orders   Sedimentation rate   CBC   COMPLETE METABOLIC PANEL WITH GFR     Musculoskeletal and Integument   Rheumatoid arthritis with rheumatoid factor (HCC) (Chronic)    Declines any DMARDs. Does not feel comfortable with the side effect profiles. Did discuss occassional use of prednisone if needed.       Relevant Medications   oxyCODONE (OXY IR/ROXICODONE) 5 MG immediate release tablet   Methocarbamol (ROBAXIN PO)   Lumbar degenerative disc disease    Discussed tx options and discussed trying to use non-narcotic tx.  Given H.O with stretches. She doesn't do any routine exercise.  She is on Paxil for OCD and is not interested in changing to Cymbalta for pain.  Discussed prn steroids since she has RA.  She is already on NSAID.  Has topical Volteran gel for joints but studies don't show effectiveness for  Back pain.  She had talked to Dr. Nelva Bush at one point about cervical injection. Could consider tramadol but still possibility for dependency.  Narcotics would be last option.  If COVID risk improves then consider restarting chiropractic care which was helpful for her.  Ok to use Muscle relaxer prn. She has some methocarbamol.        Relevant Medications   oxyCODONE (OXY IR/ROXICODONE) 5 MG immediate release tablet   Methocarbamol (ROBAXIN PO)     Other   OCD (obsessive compulsive disorder)    Currently on Paxil CR brand only      Cervical pain    See note about about low back pain.          Other Visit Diagnoses    Nail abnormalities       Relevant Orders   Ambulatory referral to Podiatry   Temporal pain       Relevant Medications   oxyCODONE (OXY IR/ROXICODONE) 5 MG immediate release tablet   Methocarbamol (ROBAXIN PO)   Other Relevant Orders   Sedimentation rate   CBC   COMPLETE METABOLIC PANEL WITH GFR     TEmporal pain - not  likely to be arteritis but will check sed rate. I really think just skin sensitivity.      No orders of the defined types were placed in this encounter.   Follow-up: No follow-ups on file.   Time spent 40 min, > 50% spent face to face counseling about temporal pain, cervical pain. Low back pain, HTN, RA, and OCD.  Beatrice Lecher, MD

## 2018-11-07 NOTE — Patient Instructions (Addendum)

## 2018-11-07 NOTE — Assessment & Plan Note (Signed)
Currently on Paxil CR brand only

## 2018-11-07 NOTE — Assessment & Plan Note (Signed)
Well controlled. Continue current regimen. Follow up in  6 mo  

## 2018-11-08 LAB — CBC
HCT: 37.5 % (ref 35.0–45.0)
Hemoglobin: 12.3 g/dL (ref 11.7–15.5)
MCH: 27.9 pg (ref 27.0–33.0)
MCHC: 32.8 g/dL (ref 32.0–36.0)
MCV: 85 fL (ref 80.0–100.0)
MPV: 11.3 fL (ref 7.5–12.5)
Platelets: 364 10*3/uL (ref 140–400)
RBC: 4.41 10*6/uL (ref 3.80–5.10)
RDW: 12.8 % (ref 11.0–15.0)
WBC: 6.7 10*3/uL (ref 3.8–10.8)

## 2018-11-08 LAB — COMPLETE METABOLIC PANEL WITH GFR
AG Ratio: 2.2 (calc) (ref 1.0–2.5)
ALT: 31 U/L — ABNORMAL HIGH (ref 6–29)
AST: 17 U/L (ref 10–35)
Albumin: 4.3 g/dL (ref 3.6–5.1)
Alkaline phosphatase (APISO): 66 U/L (ref 37–153)
BUN: 23 mg/dL (ref 7–25)
CO2: 24 mmol/L (ref 20–32)
Calcium: 9.4 mg/dL (ref 8.6–10.4)
Chloride: 102 mmol/L (ref 98–110)
Creat: 0.8 mg/dL (ref 0.60–0.93)
GFR, Est African American: 84 mL/min/{1.73_m2} (ref >=60)
GFR, Est Non African American: 73 mL/min/{1.73_m2} (ref >=60)
Globulin: 2 g/dL (calc) (ref 1.9–3.7)
Glucose, Bld: 104 mg/dL — ABNORMAL HIGH (ref 65–99)
Potassium: 4.6 mmol/L (ref 3.5–5.3)
Sodium: 140 mmol/L (ref 135–146)
Total Bilirubin: 0.3 mg/dL (ref 0.2–1.2)
Total Protein: 6.3 g/dL (ref 6.1–8.1)

## 2018-11-08 LAB — SEDIMENTATION RATE: Sed Rate: 22 mm/h (ref 0–30)

## 2018-11-09 ENCOUNTER — Other Ambulatory Visit: Payer: Self-pay | Admitting: Family Medicine

## 2018-11-10 ENCOUNTER — Ambulatory Visit: Payer: Medicare Other | Admitting: Family Medicine

## 2018-11-10 DIAGNOSIS — E559 Vitamin D deficiency, unspecified: Secondary | ICD-10-CM | POA: Diagnosis not present

## 2018-11-10 DIAGNOSIS — R7301 Impaired fasting glucose: Secondary | ICD-10-CM | POA: Diagnosis not present

## 2018-11-10 DIAGNOSIS — E7800 Pure hypercholesterolemia, unspecified: Secondary | ICD-10-CM | POA: Diagnosis not present

## 2018-11-10 DIAGNOSIS — E039 Hypothyroidism, unspecified: Secondary | ICD-10-CM | POA: Diagnosis not present

## 2018-11-11 ENCOUNTER — Other Ambulatory Visit: Payer: Self-pay | Admitting: Family Medicine

## 2018-11-11 MED ORDER — TRAMADOL HCL 50 MG PO TABS: 50.0000 mg | ORAL_TABLET | Freq: Three times a day (TID) | ORAL | 0 refills | Status: DC | PRN

## 2018-11-11 NOTE — Addendum Note (Signed)
Addended by: Beatrice Lecher D on: 11/11/2018 06:52 PM   Modules accepted: Orders

## 2018-11-13 ENCOUNTER — Telehealth: Payer: Self-pay

## 2018-11-13 NOTE — Telephone Encounter (Signed)
[name] left a message stating she rather not take the tramadol for the pain. She would rather have gabapentin.

## 2018-11-14 ENCOUNTER — Encounter: Payer: Self-pay | Admitting: *Deleted

## 2018-11-14 MED ORDER — GABAPENTIN 100 MG PO CAPS: ORAL_CAPSULE | ORAL | 0 refills | Status: DC

## 2018-11-14 NOTE — Telephone Encounter (Signed)
Pt advised. Will start RX

## 2018-11-14 NOTE — Telephone Encounter (Signed)
Okay, sounds good we will send over gabapentin.  I think to be much more gentle on her stomach and kidneys then an NSAID like Mobic or similar product.  New prescription sent to pharmacy.  We will have to start low and then taper her dose upward until she finds a dose that works well and provide some pain relief for her.  If she finds it is a little too sedating then she can slow the taper down or give [place] a call

## 2018-11-20 DIAGNOSIS — B351 Tinea unguium: Secondary | ICD-10-CM | POA: Diagnosis not present

## 2018-11-20 DIAGNOSIS — L602 Onychogryphosis: Secondary | ICD-10-CM | POA: Diagnosis not present

## 2018-11-25 DIAGNOSIS — F410 Panic disorder [episodic paroxysmal anxiety] without agoraphobia: Secondary | ICD-10-CM | POA: Diagnosis not present

## 2018-11-25 DIAGNOSIS — F902 Attention-deficit hyperactivity disorder, combined type: Secondary | ICD-10-CM | POA: Diagnosis not present

## 2018-11-25 DIAGNOSIS — F422 Mixed obsessional thoughts and acts: Secondary | ICD-10-CM | POA: Diagnosis not present

## 2018-11-26 ENCOUNTER — Telehealth: Payer: Self-pay | Admitting: *Deleted

## 2018-11-26 MED ORDER — LOSARTAN POTASSIUM 50 MG PO TABS: 50.0000 mg | ORAL_TABLET | Freq: Every day | ORAL | 3 refills | Status: DC

## 2018-11-26 NOTE — Telephone Encounter (Signed)
Spoke w/pt and she is ok with changing the losartan 25 mg BID to the 50 mg QD. She told me that she wasn't able to get this in the 50 mg because it wasn't available at one time. Will send over new prescription for 50 mg and dc the 25 mg.   She wanted me to tell Dr. Madilyn Fireman that she has been taking the Gabapentin 100 mg as directed however, she stated that it is not working as well for the pain. She was taking the 300 mg of this after her knee surgery. addition to the Oxycodone 5 mg. When asked if the 300 mg dose of Gabapentin worked well for her she stated that she was taking that in addition to the Oxycodone 5 mg. So she cannot be certain that it was because of the pain medication.   She also wanted to inform Dr. Madilyn Fireman that she contacted Dr. Nelva Bush and she will be receiving the injections for her neck soon.  Will fwd to pcp for review.Elouise Munroe, [place]

## 2018-11-27 DIAGNOSIS — R5382 Chronic fatigue, unspecified: Secondary | ICD-10-CM | POA: Diagnosis not present

## 2018-11-27 DIAGNOSIS — R7303 Prediabetes: Secondary | ICD-10-CM | POA: Diagnosis not present

## 2018-11-27 DIAGNOSIS — I10 Essential (primary) hypertension: Secondary | ICD-10-CM | POA: Diagnosis not present

## 2018-11-27 DIAGNOSIS — L659 Nonscarring hair loss, unspecified: Secondary | ICD-10-CM | POA: Diagnosis not present

## 2018-11-27 DIAGNOSIS — E039 Hypothyroidism, unspecified: Secondary | ICD-10-CM | POA: Diagnosis not present

## 2018-11-27 DIAGNOSIS — E7800 Pure hypercholesterolemia, unspecified: Secondary | ICD-10-CM | POA: Diagnosis not present

## 2018-11-27 DIAGNOSIS — E559 Vitamin D deficiency, unspecified: Secondary | ICD-10-CM | POA: Diagnosis not present

## 2018-11-27 DIAGNOSIS — R7301 Impaired fasting glucose: Secondary | ICD-10-CM | POA: Diagnosis not present

## 2018-11-27 NOTE — Telephone Encounter (Signed)
Pt advised. She questions if she should take the 300mg  at one time or spread out throughout the day. Routing.

## 2018-11-27 NOTE — Telephone Encounter (Signed)
OK, sounds good. Have her try to increase her gabapenting to 3 tabs, which is 300mg  for a week and see if helps;

## 2018-11-27 NOTE — Telephone Encounter (Signed)
Yes, have her take it 3 times a day so 1 tab in the morning, 1 in the afternoon and 1 at bedtime.  If she tolerates that well then if we need to go up we can increase from there.  But if we increase her dose too rapidly it will make her feel sleepy and tired.

## 2018-11-28 NOTE — Telephone Encounter (Signed)
Pt advised of recommendations. She asked if the Gabapentin can cause constipation? I told her that it can and that she should take a stool softener to help w/this. She stated that she takes Miralax. Told her that works well also.[name], [place]

## 2018-12-02 DIAGNOSIS — F902 Attention-deficit hyperactivity disorder, combined type: Secondary | ICD-10-CM | POA: Diagnosis not present

## 2018-12-02 DIAGNOSIS — F422 Mixed obsessional thoughts and acts: Secondary | ICD-10-CM | POA: Diagnosis not present

## 2018-12-02 DIAGNOSIS — F410 Panic disorder [episodic paroxysmal anxiety] without agoraphobia: Secondary | ICD-10-CM | POA: Diagnosis not present

## 2018-12-02 NOTE — Telephone Encounter (Signed)
Shykeria did increase the gabapentin. She states it has not relieved the pain at all. She states she is going to see Dr Nelva Bush for neck injections. She is not going to take anymore of the gabapentin or Tylenol. She has 3 Oxycodone left and will take it only as needed when the pain is really bad. She just wanted to keep Dr Madilyn Fireman updated.

## 2018-12-11 DIAGNOSIS — M5030 Other cervical disc degeneration, unspecified cervical region: Secondary | ICD-10-CM | POA: Diagnosis not present

## 2018-12-18 ENCOUNTER — Telehealth: Payer: Self-pay

## 2018-12-18 DIAGNOSIS — R748 Abnormal levels of other serum enzymes: Secondary | ICD-10-CM

## 2018-12-18 NOTE — Telephone Encounter (Signed)
Patient called stating she was due for lab re-check. Looking at her previous labs provider notes: "of your liver enzymes was just borderline elevated. It is not in a worrisome range but I do want keep an eye on this and plan to recheck your liver function in 1 month."  Hepatic function ordered. Any other labs you would like for pt?

## 2018-12-19 NOTE — Telephone Encounter (Signed)
Signed and printed

## 2018-12-24 ENCOUNTER — Other Ambulatory Visit: Payer: Self-pay

## 2018-12-24 ENCOUNTER — Ambulatory Visit (INDEPENDENT_AMBULATORY_CARE_PROVIDER_SITE_OTHER): Payer: Medicare Other | Admitting: Family Medicine

## 2018-12-24 DIAGNOSIS — Z23 Encounter for immunization: Secondary | ICD-10-CM

## 2019-01-03 DIAGNOSIS — M5030 Other cervical disc degeneration, unspecified cervical region: Secondary | ICD-10-CM | POA: Diagnosis not present

## 2019-01-06 DIAGNOSIS — F410 Panic disorder [episodic paroxysmal anxiety] without agoraphobia: Secondary | ICD-10-CM | POA: Diagnosis not present

## 2019-01-06 DIAGNOSIS — F422 Mixed obsessional thoughts and acts: Secondary | ICD-10-CM | POA: Diagnosis not present

## 2019-01-06 DIAGNOSIS — F902 Attention-deficit hyperactivity disorder, combined type: Secondary | ICD-10-CM | POA: Diagnosis not present

## 2019-01-13 DIAGNOSIS — F410 Panic disorder [episodic paroxysmal anxiety] without agoraphobia: Secondary | ICD-10-CM | POA: Diagnosis not present

## 2019-01-13 DIAGNOSIS — F422 Mixed obsessional thoughts and acts: Secondary | ICD-10-CM | POA: Diagnosis not present

## 2019-01-13 DIAGNOSIS — F902 Attention-deficit hyperactivity disorder, combined type: Secondary | ICD-10-CM | POA: Diagnosis not present

## 2019-01-28 DIAGNOSIS — F902 Attention-deficit hyperactivity disorder, combined type: Secondary | ICD-10-CM | POA: Diagnosis not present

## 2019-01-28 DIAGNOSIS — F422 Mixed obsessional thoughts and acts: Secondary | ICD-10-CM | POA: Diagnosis not present

## 2019-01-28 DIAGNOSIS — F410 Panic disorder [episodic paroxysmal anxiety] without agoraphobia: Secondary | ICD-10-CM | POA: Diagnosis not present

## 2019-01-31 ENCOUNTER — Other Ambulatory Visit: Payer: Self-pay | Admitting: Family Medicine

## 2019-02-02 ENCOUNTER — Other Ambulatory Visit: Payer: Self-pay | Admitting: Family Medicine

## 2019-02-09 DIAGNOSIS — Z20828 Contact with and (suspected) exposure to other viral communicable diseases: Secondary | ICD-10-CM | POA: Diagnosis not present

## 2019-02-17 DIAGNOSIS — F422 Mixed obsessional thoughts and acts: Secondary | ICD-10-CM | POA: Diagnosis not present

## 2019-02-17 DIAGNOSIS — F410 Panic disorder [episodic paroxysmal anxiety] without agoraphobia: Secondary | ICD-10-CM | POA: Diagnosis not present

## 2019-02-17 DIAGNOSIS — F902 Attention-deficit hyperactivity disorder, combined type: Secondary | ICD-10-CM | POA: Diagnosis not present

## 2019-03-03 DIAGNOSIS — F422 Mixed obsessional thoughts and acts: Secondary | ICD-10-CM | POA: Diagnosis not present

## 2019-03-03 DIAGNOSIS — F902 Attention-deficit hyperactivity disorder, combined type: Secondary | ICD-10-CM | POA: Diagnosis not present

## 2019-03-03 DIAGNOSIS — M47812 Spondylosis without myelopathy or radiculopathy, cervical region: Secondary | ICD-10-CM | POA: Diagnosis not present

## 2019-03-03 DIAGNOSIS — F410 Panic disorder [episodic paroxysmal anxiety] without agoraphobia: Secondary | ICD-10-CM | POA: Diagnosis not present

## 2019-03-06 DIAGNOSIS — M25511 Pain in right shoulder: Secondary | ICD-10-CM | POA: Diagnosis not present

## 2019-03-10 ENCOUNTER — Other Ambulatory Visit: Payer: Self-pay | Admitting: Family Medicine

## 2019-03-14 DIAGNOSIS — M25511 Pain in right shoulder: Secondary | ICD-10-CM | POA: Diagnosis not present

## 2019-03-17 DIAGNOSIS — F422 Mixed obsessional thoughts and acts: Secondary | ICD-10-CM | POA: Diagnosis not present

## 2019-03-17 DIAGNOSIS — E039 Hypothyroidism, unspecified: Secondary | ICD-10-CM | POA: Diagnosis not present

## 2019-03-17 DIAGNOSIS — F902 Attention-deficit hyperactivity disorder, combined type: Secondary | ICD-10-CM | POA: Diagnosis not present

## 2019-03-17 DIAGNOSIS — F410 Panic disorder [episodic paroxysmal anxiety] without agoraphobia: Secondary | ICD-10-CM | POA: Diagnosis not present

## 2019-03-17 DIAGNOSIS — R7301 Impaired fasting glucose: Secondary | ICD-10-CM | POA: Diagnosis not present

## 2019-03-17 DIAGNOSIS — E7800 Pure hypercholesterolemia, unspecified: Secondary | ICD-10-CM | POA: Diagnosis not present

## 2019-03-20 ENCOUNTER — Ambulatory Visit: Payer: Medicare Other | Attending: Internal Medicine

## 2019-03-20 DIAGNOSIS — Z23 Encounter for immunization: Secondary | ICD-10-CM | POA: Insufficient documentation

## 2019-03-20 NOTE — Progress Notes (Signed)
   Covid-19 Vaccination Clinic  Name:  [name]    MRN: [id number] DOB: November 10, 1945  03/20/2019  [name] was observed post Covid-19 immunization for 15 minutes without incidence. She was provided with Vaccine Information Sheet and instruction to access the V-Safe system.   [name] was instructed to call 911 with any severe reactions post vaccine: Marland Kitchen Difficulty breathing  . Swelling of your face and throat  . A fast heartbeat  . A bad rash all over your body  . Dizziness and weakness    Immunizations Administered    Name Date Dose VIS Date Route   Pfizer COVID-19 Vaccine 03/20/2019  8:29 AM 0.3 mL 01/23/2019 Intramuscular   Manufacturer: [place]   Lot: [id number]   [place]: [id number]

## 2019-03-23 DIAGNOSIS — E559 Vitamin D deficiency, unspecified: Secondary | ICD-10-CM | POA: Diagnosis not present

## 2019-03-23 DIAGNOSIS — I10 Essential (primary) hypertension: Secondary | ICD-10-CM | POA: Diagnosis not present

## 2019-03-23 DIAGNOSIS — L659 Nonscarring hair loss, unspecified: Secondary | ICD-10-CM | POA: Diagnosis not present

## 2019-03-23 DIAGNOSIS — R5382 Chronic fatigue, unspecified: Secondary | ICD-10-CM | POA: Diagnosis not present

## 2019-03-23 DIAGNOSIS — E039 Hypothyroidism, unspecified: Secondary | ICD-10-CM | POA: Diagnosis not present

## 2019-03-23 DIAGNOSIS — E7800 Pure hypercholesterolemia, unspecified: Secondary | ICD-10-CM | POA: Diagnosis not present

## 2019-03-23 DIAGNOSIS — R7301 Impaired fasting glucose: Secondary | ICD-10-CM | POA: Diagnosis not present

## 2019-03-30 ENCOUNTER — Ambulatory Visit: Payer: BC Managed Care – PPO | Admitting: Family Medicine

## 2019-03-31 DIAGNOSIS — F902 Attention-deficit hyperactivity disorder, combined type: Secondary | ICD-10-CM | POA: Diagnosis not present

## 2019-03-31 DIAGNOSIS — F422 Mixed obsessional thoughts and acts: Secondary | ICD-10-CM | POA: Diagnosis not present

## 2019-03-31 DIAGNOSIS — F410 Panic disorder [episodic paroxysmal anxiety] without agoraphobia: Secondary | ICD-10-CM | POA: Diagnosis not present

## 2019-04-01 DIAGNOSIS — M75121 Complete rotator cuff tear or rupture of right shoulder, not specified as traumatic: Secondary | ICD-10-CM | POA: Diagnosis not present

## 2019-04-08 NOTE — Progress Notes (Signed)
Subjective:   Marah Wiget is a 74 y.o. female who presents for an Initial Medicare Annual Wellness Visit.  Review of Systems    No ROS.  Medicare Wellness Virtual Visit.  Visual/audio telehealth visit, UTA vital signs.   See social history for additional risk factors.     Cardiac Risk Factors include: advanced age (>33men, >21 women);hypertension Sleep patterns: Getting 9 hours of sleep a night.Wakes up 1 time a night to void.  Wakes up and feels rested. Home Safety/Smoke Alarms: Feels safe in home. Smoke alarms in place.  Living environment;Lives with husband in 2 story home and stairs have hand rails.  Shower is a walk in shower with grab bars in place.  Seat Belt Safety/Bike Helmet: Wears seat belt.   Female:   Pap- Aged out       Mammo- UTD      Dexa scan- ordered       CCS- UTD     Objective:    Today's Vitals   04/21/19 1416  PainSc: 8    There is no height or weight on file to calculate BMI.  Advanced Directives 04/21/2019 08/26/2018 08/22/2018 10/22/2017 08/10/2014 08/10/2014 08/05/2014  Does Patient Have a Medical Advance Directive? Yes Yes Yes No Yes Yes Yes  Type of Paramedic of [place];Living will [place];Living will [place];Living will - [place];Living will [place];Living will Living will;Healthcare Power of Attorney  Does patient want to make changes to medical advance directive? No - Patient declined No - Patient declined - - No - Patient declined - No - Patient declined  Copy of [place] in Chart? No - copy requested No - copy requested - - No - copy requested No - copy requested No - copy requested  Would patient like information on creating a medical advance directive? - - - No - Patient declined - - -    Current Medications (verified) Outpatient Encounter Medications as of 04/21/2019  Medication Sig  . albuterol (PROVENTIL HFA)  108 (90 Base) MCG/ACT inhaler Inhale 2 puffs into the lungs every 6 (six) hours as needed for shortness of breath.  . Ascorbic Acid (VITAMIN C WITH ROSE HIPS) 1000 MG tablet Take 1,000 mg by mouth daily.  Marland Kitchen buPROPion (WELLBUTRIN XL) 150 MG 24 hr tablet Take 150 mg by mouth daily.   . Calcium Carb-Cholecalciferol (CALCIUM 600+D3 PO) Take 1 tablet by mouth 2 (two) times a day.  . Carboxymethylcell-Hypromellose (GENTEAL OP) Place 1-2 drops into both eyes 3 (three) times daily as needed (for dry eyes).  . Cholecalciferol (VITAMIN D3) 50 MCG (2000 UT) capsule Take 200 Units by mouth daily.   . clonazePAM (KLONOPIN) 0.5 MG tablet Take 0.25 mg by mouth 2 (two) times daily as needed for anxiety.  . famotidine (PEPCID) 20 MG tablet Take 20 mg by mouth at bedtime.   . fexofenadine (ALLEGRA) 180 MG tablet Take 180 mg by mouth at bedtime.  . folic acid (FOLVITE) 1 MG tablet Take 1 mg by mouth 2 (two) times daily.    . Ginkgo Biloba 80 MG CAPS Take 400 mg by mouth daily.   . hydrochlorothiazide (HYDRODIURIL) 25 MG tablet TAKE 1 TABLET BY MOUTH EVERY DAY  . L-Lysine HCl 500 MG TABS Take 500 mg by mouth daily.   Marland Kitchen levothyroxine (SYNTHROID, LEVOTHROID) 75 MCG tablet Take 75 mcg by mouth See admin instructions. Take 75 mcg by mouth daily on  all days except Sunday  . levothyroxine (SYNTHROID, LEVOTHROID) 88 MCG tablet Take 88 mcg by mouth every Sunday.   . losartan (COZAAR) 50 MG tablet Take 1 tablet (50 mg total) by mouth daily.  . Magnesium 100 MG TABS Take 150 mg by mouth 2 (two) times a day.  . meloxicam (MOBIC) 15 MG tablet TAKE 1 TABLET (15 MG TOTAL) BY MOUTH EVERY MORNING.  . Methocarbamol (ROBAXIN PO) Take by mouth.  . metroNIDAZOLE (METROGEL) 1 % gel Apply topically daily. (Patient taking differently: Apply 1 application topically as needed. )  . minoxidil (ROGAINE) 2 % external solution Apply topically 2 (two) times daily.  Marland Kitchen OVER THE COUNTER MEDICATION Take 1 tablet by mouth daily. Super Joint Support  Supplement  . pantoprazole (PROTONIX) 40 MG tablet TAKE 1 TABLET BY MOUTH EVERY DAY  . PARoxetine (PAXIL-CR) 12.5 MG 24 hr tablet Take 25 mg by mouth 2 (two) times daily.   . polyethylene glycol (MIRALAX / GLYCOLAX) 17 g packet Take 17 g by mouth 2 (two) times daily.  . Pseudoephedrine-Guaifenesin (MUCINEX D MAX STRENGTH) 6293952102 MG TB12 Take by mouth.  . SYMBICORT 80-4.5 MCG/ACT inhaler INHALE 2 PUFFS TWICE A DAY (Patient taking differently: Inhale 2 puffs into the lungs daily. )  . atorvastatin (LIPITOR) 10 MG tablet Take 10 mg by mouth 3 (three) times a week.   . gabapentin (NEURONTIN) 100 MG capsule Take 1 capsule (100 mg total) by mouth at bedtime for 5 days, THEN 2 capsules (200 mg total) at bedtime for 5 days, THEN 2 capsules (200 mg total) 2 (two) times daily for 20 days.   No facility-administered encounter medications on file as of 04/21/2019.    Allergies (verified) Astelin [azelastine hcl], Celebrex [celecoxib], Lisinopril, Methotrexate derivatives, Morphine, and Moxifloxacin   History: Past Medical History:  Diagnosis Date  . Allergic rhinitis, cause unspecified   . Anxiety   . Benign paroxysmal positional vertigo   . Cancer ([place])    basal cell carcinoma per right side of nostril  . CHF (congestive heart failure) ([place]) 11/09/2016   had 2+ edema in legs with anxiety and SOB  . Complication of anesthesia    has 4  degenerative discs in neck.  . Depressive disorder, not elsewhere classified   . Esophageal reflux   . History of hiatal hernia   . Hypertension   . Localized osteoarthrosis not specified whether primary or secondary, lower leg   . Other and unspecified hyperlipidemia   . Other diseases of vocal cords   . Peptic ulcer, unspecified site, unspecified as acute or chronic, without mention of hemorrhage, perforation, or obstruction   . Pneumonia    bilat pneumonia 1987  . Pre-diabetes    no meds,just diet controll  . TMJ (temporomandibular joint disorder)   .  Unspecified arthropathy, hand   . Unspecified asthma(493.90)    triggered with a virus   . Unspecified hypothyroidism    Past Surgical History:  Procedure Laterality Date  . BREAST BIOPSY  09/07/2011   High Risk Lesion  . BREAST EXCISIONAL BIOPSY Left   . BREAST SURGERY     LUMPECTOMY / LEFT 10/12/2011  . KNEE ARTHROSCOPY     bilat   . NASAL SINUS SURGERY    . ROTATOR CUFF REPAIR     right   . thumb surgery      left hand 45 years ago   . TOTAL HIP ARTHROPLASTY Right 08/10/2014   Procedure: RIGHT TOTAL HIP ARTHROPLASTY ANTERIOR APPROACH;  Surgeon: Paralee Cancel, MD;  Location: [facility];  Service: Orthopedics;  Laterality: Right;  . TOTAL KNEE ARTHROPLASTY Left 08/26/2018   Procedure: TOTAL KNEE ARTHROPLASTY;  Surgeon: Paralee Cancel, MD;  Location: [facility];  Service: Orthopedics;  Laterality: Left;  70 mins   Family History  Problem Relation Age of Onset  . Heart disease Father   . Colon cancer Mother   . Liver cancer Brother   . Asthma Brother    Social History   Socioeconomic History  . Marital status: Married    Spouse name: [name]  . Number of children: 3  . Years of education: 16  . Highest education level: Bachelor's degree (e.g., BA, AB, BS)  Occupational History  . Occupation: Retired    Comment: Pharmacist, hospital  Tobacco Use  . Smoking status: Former Smoker    Packs/day: 0.30    Years: 2.00    Pack years: 0.60    Types: Cigarettes    Quit date: 02/12/1958    Years since quitting: 61.2  . Smokeless tobacco: Never Used  Substance and Sexual Activity  . Alcohol use: Yes    Alcohol/week: 1.0 standard drinks    Types: 1 Glasses of wine per week    Comment: wine socially  . Drug use: No  . Sexual activity: Yes  Other Topics Concern  . Not on file  Social History Narrative   Helps watch grand babys   Social Determinants of Health   Financial Resource Strain:   . Difficulty of Paying Living Expenses: Not on file  Food Insecurity:   . Worried About Charity fundraiser  in the Last Year: Not on file  . Ran Out of Food in the Last Year: Not on file  Transportation Needs:   . Lack of Transportation (Medical): Not on file  . Lack of Transportation (Non-Medical): Not on file  Physical Activity:   . Days of Exercise per Week: Not on file  . Minutes of Exercise per Session: Not on file  Stress:   . Feeling of Stress : Not on file  Social Connections:   . Frequency of Communication with Friends and Family: Not on file  . Frequency of Social Gatherings with Friends and Family: Not on file  . Attends Religious Services: Not on file  . Active Member of Clubs or Organizations: Not on file  . Attends Archivist Meetings: Not on file  . Marital Status: Not on file    Tobacco Counseling Counseling given: Not Answered   Clinical Intake:  Pre-visit preparation completed: Yes  Pain : 0-10 Pain Score: 8  Pain Type: Chronic pain Pain Location: Shoulder Pain Orientation: Right Pain Descriptors / Indicators: Throbbing Pain Onset: More than a month ago Pain Frequency: Constant Pain Relieving Factors: Biofreeze  Pain Relieving Factors: Biofreeze  Nutritional Risks: None Diabetes: No  How often do you need to have someone help you when you read instructions, pamphlets, or other written materials from your doctor or pharmacy?: 1 - Never What is the last grade level you completed in school?: 16  Interpreter Needed?: No  Information entered by :: Orlie Dakin, LPN   Activities of Daily Living In your present state of health, do you have any difficulty performing the following activities: 04/21/2019 08/26/2018  Hearing? N -  Vision? N -  Difficulty concentrating or making decisions? N -  Walking or climbing stairs? N -  Dressing or bathing? N -  Doing errands, shopping? N N  Conservation officer, nature and  eating ? N -  Using the Toilet? N -  In the past six months, have you accidently leaked urine? N -  Do you have problems with loss of bowel control? N -   Managing your Medications? N -  Managing your Finances? N -  Housekeeping or managing your Housekeeping? N -  Some recent data might be hidden     Immunizations and Health Maintenance Immunization History  Administered Date(s) Administered  . Fluad Quad(high Dose 65+) 12/24/2018  . Influenza Split 11/07/2011, 01/12/2013  . Influenza Whole 02/12/2005, 12/02/2007, 11/24/2008  . Influenza, High Dose Seasonal PF 11/19/2016, 12/10/2017  . Influenza,inj,Quad PF,6+ Mos 01/11/2015  . Influenza-Unspecified 11/12/2013  . PFIZER SARS-COV-2 Vaccination 03/20/2019, 04/15/2019  . Pneumococcal Conjugate-13 04/26/2014  . Pneumococcal Polysaccharide-23 02/12/2001, 11/27/2001, 11/28/2006, 11/07/2011  . Td 02/12/1998, 06/13/1998  . Tdap 09/23/2012   Health Maintenance Due  Topic Date Due  . COLONOSCOPY  03/24/2017    Patient Care Team: Hali Marry, MD as PCP - General (Family Medicine) Minus Breeding, MD as PCP - Cardiology (Cardiology) Elsie Stain, MD as Attending Physician (Pulmonary Disease) Altheimer, Legrand Como, MD as Attending Physician (Endocrinology)  Indicate any recent Medical Services you may have received from other than [facility] providers in the past year (date may be approximate).     Assessment:   This is a routine wellness examination for [place].Physical assessment deferred to PCP.   Hearing/Vision screen No exam data present  Dietary issues and exercise activities discussed: Current Exercise Habits: Home exercise routine, Time (Minutes): 15, Frequency (Times/Week): 5, Weekly Exercise (Minutes/Week): 75, Intensity: Mild, Exercise limited by: None identified Diet Eats a healthy diet of fruits, vegetables and proteins. Breakfast: Hard boiled egg, oatmeal with blueberries with flaxseed Lunch: sometimes skips or eats out or avocados with fruit Dinner:  Meat and vegetables   Drinks 2 glasses of water daily    Goals    . Weight (lb) < 200 lb (90.7 kg)      Would like to loose 40 lbs this year      Depression Screen PHQ 2/9 Scores 04/21/2019 11/19/2016 04/05/2015 01/16/2013  PHQ - 2 Score 0 0 0 0  Exception Documentation Medical reason - - -    Fall Risk Fall Risk  04/21/2019 11/19/2016 09/03/2016 04/05/2015 01/16/2013  Falls in the past year? 0 No No No No  Comment - - Emmi Telephone Survey: data to providers prior to load - -  Risk for fall due to : Impaired balance/gait - - - -  Follow up Falls prevention discussed - - - -    Is the patient's home free of loose throw rugs in walkways, pet beds, electrical cords, etc?   yes      Grab bars in the bathroom? yes      Handrails on the stairs?   yes      Adequate lighting?   yes   Cognitive Function:     6CIT Screen 04/21/2019  What Year? 0 points  What month? 0 points  What time? 0 points  Count back from 20 0 points  Months in reverse 0 points  Repeat phrase 0 points  Total Score 0    Screening Tests Health Maintenance  Topic Date Due  . COLONOSCOPY  03/24/2017  . TETANUS/TDAP  09/24/2022  . INFLUENZA VACCINE  Completed  . DEXA SCAN  Completed  . Hepatitis C Screening  Completed  . PNA vac Low Risk Adult  Completed      Plan:  Please schedule your next medicare wellness visit with me in 1 yr.  Ms. Shambach , Thank you for taking time to come for your Medicare Wellness Visit. I appreciate your ongoing commitment to your health goals. Please review the following plan we discussed and let me know if I can assist you in the future.   Bring a copy of your living will and/or healthcare power of attorney to your next office visit. Continue doing brain stimulating activities (puzzles, reading, adult coloring books, staying active) to keep memory sharp.     These are the goals we discussed: Goals    . Weight (lb) < 200 lb (90.7 kg)     Would like to loose 40 lbs this year       This is a list of the screening recommended for you and due dates:  Health Maintenance  Topic Date  Due  . Colon Cancer Screening  03/24/2017  . Tetanus Vaccine  09/24/2022  . Flu Shot  Completed  . DEXA scan (bone density measurement)  Completed  .  Hepatitis C: One time screening is recommended by Center for Disease Control  (CDC) for  adults born from [birth year] through 1965.   Completed  . Pneumonia vaccines  Completed     I have personally reviewed and noted the following in the patient's chart:   . Medical and social history . Use of alcohol, tobacco or illicit drugs  . Current medications and supplements . Functional ability and status . Nutritional status . Physical activity . Advanced directives . List of other physicians . Hospitalizations, surgeries, and ER visits in previous 12 months . Vitals . Screenings to include cognitive, depression, and falls . Referrals and appointments  In addition, I have reviewed and discussed with patient certain preventive protocols, quality metrics, and best practice recommendations. A written personalized care plan for preventive services as well as general preventive health recommendations were provided to patient.     Joanne Chars, LPN   [id number]

## 2019-04-13 DIAGNOSIS — M25511 Pain in right shoulder: Secondary | ICD-10-CM | POA: Diagnosis not present

## 2019-04-14 DIAGNOSIS — F410 Panic disorder [episodic paroxysmal anxiety] without agoraphobia: Secondary | ICD-10-CM | POA: Diagnosis not present

## 2019-04-14 DIAGNOSIS — F422 Mixed obsessional thoughts and acts: Secondary | ICD-10-CM | POA: Diagnosis not present

## 2019-04-14 DIAGNOSIS — F902 Attention-deficit hyperactivity disorder, combined type: Secondary | ICD-10-CM | POA: Diagnosis not present

## 2019-04-15 ENCOUNTER — Ambulatory Visit: Payer: Medicare Other | Attending: Internal Medicine

## 2019-04-15 DIAGNOSIS — Z23 Encounter for immunization: Secondary | ICD-10-CM | POA: Insufficient documentation

## 2019-04-15 NOTE — Progress Notes (Signed)
   Covid-19 Vaccination Clinic  Name:  [name]    MRN: [id number] DOB: Jul 07, 1945  04/15/2019  [name] was observed post Covid-19 immunization for 15 minutes without incident. She was provided with Vaccine Information Sheet and instruction to access the V-Safe system.   [name] was instructed to call 911 with any severe reactions post vaccine: Marland Kitchen Difficulty breathing  . Swelling of face and throat  . A fast heartbeat  . A bad rash all over body  . Dizziness and weakness   Immunizations Administered    Name Date Dose VIS Date Route   Pfizer COVID-19 Vaccine 04/15/2019 10:10 AM 0.3 mL 01/23/2019 Intramuscular   Manufacturer: [place]   Lot: [id number]   [place]: [id number]

## 2019-04-17 DIAGNOSIS — B351 Tinea unguium: Secondary | ICD-10-CM | POA: Diagnosis not present

## 2019-04-17 DIAGNOSIS — L602 Onychogryphosis: Secondary | ICD-10-CM | POA: Diagnosis not present

## 2019-04-20 DIAGNOSIS — M25511 Pain in right shoulder: Secondary | ICD-10-CM | POA: Diagnosis not present

## 2019-04-21 ENCOUNTER — Other Ambulatory Visit: Payer: Self-pay

## 2019-04-21 ENCOUNTER — Ambulatory Visit (INDEPENDENT_AMBULATORY_CARE_PROVIDER_SITE_OTHER): Payer: Medicare Other | Admitting: *Deleted

## 2019-04-21 VITALS — BP 132/64 | HR 82 | Ht 62.0 in | Wt 187.0 lb

## 2019-04-21 DIAGNOSIS — Z780 Asymptomatic menopausal state: Secondary | ICD-10-CM | POA: Diagnosis not present

## 2019-04-21 DIAGNOSIS — Z0000 Encounter for general adult medical examination without abnormal findings: Secondary | ICD-10-CM | POA: Diagnosis not present

## 2019-04-21 DIAGNOSIS — Z13820 Encounter for screening for osteoporosis: Secondary | ICD-10-CM | POA: Diagnosis not present

## 2019-04-21 NOTE — Patient Instructions (Addendum)
Please schedule your next medicare wellness visit with me in 1 yr.  [name] , Thank you for taking time to come for your Medicare Wellness Visit. I appreciate your ongoing commitment to your health goals. Please review the following plan we discussed and let me know if I can assist you in the future.   Bring a copy of your living will and/or healthcare power of attorney to your next office visit.Continue doing brain stimulating activities (puzzles, reading, adult coloring books, staying active) to keep memory sharp.

## 2019-04-27 DIAGNOSIS — M25511 Pain in right shoulder: Secondary | ICD-10-CM | POA: Diagnosis not present

## 2019-04-30 DIAGNOSIS — M25511 Pain in right shoulder: Secondary | ICD-10-CM | POA: Diagnosis not present

## 2019-05-05 ENCOUNTER — Encounter: Payer: Self-pay | Admitting: Pulmonary Disease

## 2019-05-05 ENCOUNTER — Ambulatory Visit (INDEPENDENT_AMBULATORY_CARE_PROVIDER_SITE_OTHER): Payer: Medicare Other | Admitting: Pulmonary Disease

## 2019-05-05 ENCOUNTER — Other Ambulatory Visit: Payer: Self-pay

## 2019-05-05 DIAGNOSIS — J4530 Mild persistent asthma, uncomplicated: Secondary | ICD-10-CM | POA: Diagnosis not present

## 2019-05-05 DIAGNOSIS — J301 Allergic rhinitis due to pollen: Secondary | ICD-10-CM | POA: Diagnosis not present

## 2019-05-05 DIAGNOSIS — M25511 Pain in right shoulder: Secondary | ICD-10-CM | POA: Diagnosis not present

## 2019-05-05 MED ORDER — ALBUTEROL SULFATE HFA 108 (90 BASE) MCG/ACT IN AERS: 2.0000 | INHALATION_SPRAY | Freq: Four times a day (QID) | RESPIRATORY_TRACT | 5 refills | Status: DC | PRN

## 2019-05-05 MED ORDER — BUDESONIDE-FORMOTEROL FUMARATE 80-4.5 MCG/ACT IN AERO: 2.0000 | INHALATION_SPRAY | Freq: Two times a day (BID) | RESPIRATORY_TRACT | 3 refills | Status: DC

## 2019-05-05 NOTE — Patient Instructions (Signed)
Refills on albuterol MDI and Symbicort Generic Flonase is called fluticasone nasal spray

## 2019-05-05 NOTE — Assessment & Plan Note (Signed)
Previous stepdown attempt has not worked. Continue on Symbicort -perhaps 1 puff twice daily better than 2 puffs once daily  Albuterol as needed only, overall control appears to be good

## 2019-05-05 NOTE — Progress Notes (Signed)
   Subjective:    Patient ID: [name], female    DOB: 1945-11-19, 74 y.o.   MRN: [id number]  HPI  74 yo woman for FU mild persistent asthma, allergic rhinitis and chronic cough with vocal cord dysfunction Symptoms dramatically improved after she got rid of her cats   Last OV 02/2018 reviewed for acute exacerbation, Symbicort increased to 2 puffs twice daily She is back down to 2 puffs once daily, has done well during the pandemic overall, rare use of albuterol, no nocturnal symptoms Dry cough return sometimes, medication review only shows losartan, no obvious reflux, occasional postnasal drip.  Flonase took her sense of smell away, wonders about generic alternatives  Review of Systems Patient denies significant dyspnea,cough, hemoptysis,  chest pain, palpitations, pedal edema, orthopnea, paroxysmal nocturnal dyspnea, lightheadedness, nausea, vomiting, abdominal or  leg pains      Objective:   Physical Exam  Gen. Pleasant, well-nourished, in no distress ENT - no thrush, no pallor/icterus,no post nasal drip Neck: No JVD, no thyromegaly, no carotid bruits Lungs: no use of accessory muscles, no dullness to percussion, clear without rales or rhonchi  Cardiovascular: Rhythm regular, heart sounds  normal, no murmurs or gallops, no peripheral edema Musculoskeletal: No deformities, no cyanosis or clubbing         Assessment & Plan:

## 2019-05-05 NOTE — Assessment & Plan Note (Signed)
Okay to use generic fluticasone during spring

## 2019-05-07 DIAGNOSIS — M25511 Pain in right shoulder: Secondary | ICD-10-CM | POA: Diagnosis not present

## 2019-05-13 DIAGNOSIS — M75121 Complete rotator cuff tear or rupture of right shoulder, not specified as traumatic: Secondary | ICD-10-CM | POA: Diagnosis not present

## 2019-05-14 DIAGNOSIS — M47812 Spondylosis without myelopathy or radiculopathy, cervical region: Secondary | ICD-10-CM | POA: Diagnosis not present

## 2019-05-19 DIAGNOSIS — F422 Mixed obsessional thoughts and acts: Secondary | ICD-10-CM | POA: Diagnosis not present

## 2019-05-19 DIAGNOSIS — F902 Attention-deficit hyperactivity disorder, combined type: Secondary | ICD-10-CM | POA: Diagnosis not present

## 2019-05-19 DIAGNOSIS — F410 Panic disorder [episodic paroxysmal anxiety] without agoraphobia: Secondary | ICD-10-CM | POA: Diagnosis not present

## 2019-05-20 ENCOUNTER — Other Ambulatory Visit: Payer: Medicare Other

## 2019-05-25 ENCOUNTER — Other Ambulatory Visit: Payer: Self-pay

## 2019-05-25 ENCOUNTER — Ambulatory Visit (INDEPENDENT_AMBULATORY_CARE_PROVIDER_SITE_OTHER): Payer: Medicare Other | Admitting: Physician Assistant

## 2019-05-25 ENCOUNTER — Encounter: Payer: Self-pay | Admitting: Physician Assistant

## 2019-05-25 VITALS — BP 126/73 | HR 79 | Ht 62.0 in | Wt 186.0 lb

## 2019-05-25 DIAGNOSIS — K921 Melena: Secondary | ICD-10-CM | POA: Diagnosis not present

## 2019-05-25 DIAGNOSIS — Z800 Family history of malignant neoplasm of digestive organs: Secondary | ICD-10-CM

## 2019-05-25 DIAGNOSIS — K5903 Drug induced constipation: Secondary | ICD-10-CM | POA: Diagnosis not present

## 2019-05-25 DIAGNOSIS — K648 Other hemorrhoids: Secondary | ICD-10-CM | POA: Diagnosis not present

## 2019-05-25 DIAGNOSIS — N3000 Acute cystitis without hematuria: Secondary | ICD-10-CM

## 2019-05-25 DIAGNOSIS — R1030 Lower abdominal pain, unspecified: Secondary | ICD-10-CM | POA: Diagnosis not present

## 2019-05-25 DIAGNOSIS — R197 Diarrhea, unspecified: Secondary | ICD-10-CM | POA: Insufficient documentation

## 2019-05-25 LAB — POCT URINALYSIS DIP (CLINITEK)
Bilirubin, UA: NEGATIVE
Blood, UA: NEGATIVE
Glucose, UA: NEGATIVE mg/dL
Ketones, POC UA: NEGATIVE mg/dL
Nitrite, UA: POSITIVE — AB
POC PROTEIN,UA: NEGATIVE
Spec Grav, UA: 1.025 (ref 1.010–1.025)
Urobilinogen, UA: 0.2 E.U./dL
pH, UA: 7 (ref 5.0–8.0)

## 2019-05-25 MED ORDER — AMOXICILLIN-POT CLAVULANATE 875-125 MG PO TABS: 1.0000 | ORAL_TABLET | Freq: Two times a day (BID) | ORAL | 0 refills | Status: DC

## 2019-05-25 NOTE — Progress Notes (Signed)
Subjective:    Patient ID: [name], female    DOB: 04-18-1945, 74 y.o.   MRN: [id number]  HPI Pt is a 74 yo female who presents to the clinic with "blood in stool".  1 week ago patient hurt her neck and took some Norco.  This constipated her.  She worked to get her stools looser and then had diarrhea that resulted in abdominal cramping and bright red blood in stool.  The bright red blood has since resolved.  Her mother died of colon cancer and thus is very concerned.  Her last colonoscopy was February 2014 with a desired 5-year follow-up.  She has no known colon polyps but she did show some internal hemorrhoids.  Around the same time she did eat a piece of 40 month old cake and she is worried this caused her stomach to be upset as well.  She denies any painful urination or flank pain.  She did report one night of low-grade fever and chills.  .. Active Ambulatory Problems    Diagnosis Date Noted  . Hypothyroidism 09/24/2006  . HYPERLIPIDEMIA 09/23/2006  . DEPRESSION 09/24/2006  . VERTIGO, BENIGN PAROXYSMAL POSITION 09/24/2006  . Allergic rhinitis 11/05/2006  . VOCAL CORD DISORDER 11/05/2006  . Asthma with allergic rhinitis 11/05/2006  . GERD 09/24/2006  . Rheumatoid arthritis with rheumatoid factor ([place]) 11/30/2008  . LOC OSTEOARTHROS NOT SPEC PRIM/SEC LOWER LEG 03/19/2007  . SOMNOLENCE 11/25/2009  . POSTMENOPAUSAL STATUS 11/30/2008  . Abnormal liver enzymes 10/25/2010  . CARRIER/SUSPECTED CARRIER GROUP B STREPTOCOCCUS 11/17/2010  . Obesity (BMI 30-39.9) 05/03/2011  . Abnormal mammogram with microcalcification 10/04/2011  . Atypical ductal hyperplasia of breast 10/29/2011  . OCD (obsessive compulsive disorder) 05/09/2012  . Oral herpes 06/23/2012  . Vitreous detachment 09/23/2012  . IFG (impaired fasting glucose) 01/25/2014  . Essential hypertension, benign 05/26/2014  . Primary osteoarthritis of right hip 05/27/2014  . S/P right THA, AA 08/10/2014  . Venous stasis ulcer of  left lower extremity ([place]) 03/03/2015  . Tendon nodule 01/16/2016  . Fatty liver 11/09/2016  . Vitamin D deficiency 06/19/2016  . Chronic fatigue 06/19/2016  . Alopecia 06/19/2016  . Aortic atherosclerosis ([place]) 09/26/2017  . S/P left TKA 08/26/2018  . Overweight (BMI 25.0-29.9) 08/27/2018  . Pain in right knee 01/03/2018  . Lumbar degenerative disc disease 11/07/2018  . Cervical pain 11/07/2018  . Drug-induced constipation 05/25/2019  . Diarrhea 05/25/2019  . Lower abdominal pain 05/25/2019  . Blood in stool 05/25/2019  . Internal hemorrhoids 05/25/2019   Resolved Ambulatory Problems    Diagnosis Date Noted  . EUSTACHIAN TUBE DYSFUNCTION, LEFT 04/13/2009  . Acute maxillary sinusitis 01/19/2009  . PEPTIC ULCER DISEASE 11/05/2006  . ARTHRITIS, HANDS, BILATERAL 12/22/2007  . Precordial pain 06/23/2008  . Chest pain 10/25/2010  . Dyspnea 10/25/2010  . Dyspnea 06/26/2013  . ACE-inhibitor cough 05/26/2014  . Preoperative clearance 08/03/2014  . Acute sinusitis 04/12/2015  . Pain of left hand 05/22/2017   Past Medical History:  Diagnosis Date  . Allergic rhinitis, cause unspecified   . Anxiety   . Cancer ([place])   . CHF (congestive heart failure) ([place]) 11/09/2016  . Complication of anesthesia   . History of hiatal hernia   . Hypertension   . Pneumonia   . Pre-diabetes   . TMJ (temporomandibular joint disorder)   . Unspecified asthma(493.90)   . Unspecified hypothyroidism     Review of Systems See HPI.     Objective:   Physical Exam Vitals reviewed.  Constitutional:      Appearance: Normal appearance.  HENT:     Head: Normocephalic.  Cardiovascular:     Rate and Rhythm: Normal rate and regular rhythm.     Pulses: Normal pulses.  Pulmonary:     Effort: Pulmonary effort is normal.     Breath sounds: Normal breath sounds.  Abdominal:     General: Bowel sounds are normal. There is no distension.     Palpations: Abdomen is soft.     Tenderness: There is abdominal  tenderness. There is no right CVA tenderness, left CVA tenderness or guarding.     Comments: Diffuse tenderness over lower abdominal area and suprapubic area.   Musculoskeletal:     Right lower leg: No edema.     Left lower leg: No edema.  Neurological:     General: No focal deficit present.     Mental Status: She is alert and oriented to person, place, and time.  Psychiatric:        Mood and Affect: Mood normal.           Assessment & Plan:  Marland KitchenMarland KitchenEllierose was seen today for rectal bleeding.  Diagnoses and all orders for this visit:  Blood in stool -     Ambulatory referral to Gastroenterology -     CBC with Differential/Platelet  Internal hemorrhoids -     Ambulatory referral to Gastroenterology  Lower abdominal pain -     Ambulatory referral to Gastroenterology -     CBC with Differential/Platelet -     Urine Culture -     POCT URINALYSIS DIP (CLINITEK)  Diarrhea, unspecified type  Drug-induced constipation  Acute cystitis without hematuria -     amoxicillin-clavulanate (AUGMENTIN) 875-125 MG tablet; Take 1 tablet by mouth 2 (two) times daily.    Results for orders placed or performed in visit on 05/25/19  POCT URINALYSIS DIP (CLINITEK)  Result Value Ref Range   Color, UA yellow yellow   Clarity, UA clear clear   Glucose, UA negative negative mg/dL   Bilirubin, UA negative negative   Ketones, POC UA negative negative mg/dL   Spec Grav, UA 1.025 1.010 - 1.025   Blood, UA negative negative   pH, UA 7.0 5.0 - 8.0   POC PROTEIN,UA negative negative, trace   Urobilinogen, UA 0.2 0.2 or 1.0 E.U./dL   Nitrite, UA Positive (A) Negative   Leukocytes, UA Small (1+) (A) Negative   Likely constipation came from using opioids.  Suggested probiotics to regulate bowel movements.  More specifically encourage Culturelle. Patient reports bright red blood in stools.  This has since resolved.  Likely her internal hemorrhoids however she is overdue for colonoscopy.  Her mother  passed away from colon cancer.  Her last colonoscopy was in 2014.  Will refer for colonoscopy.  She is having some lower abdominal cramping.  UA was positive for nitrites and leukocytes.  She was treated for urinary tract infection with Augmentin.  Will send for culture.  We will check CBC for concerns of diverticulitis and to make sure hemoglobin is WNL. Patient did not have any significant abdominal tenderness but more diffuse tenderness in the lower abdomen.  Follow up as needed or after GI visit.

## 2019-05-25 NOTE — Patient Instructions (Addendum)
cultrelle probiotic daily.  Make referral for colonoscopy.  Get CBC.  Will call with urine culture.

## 2019-05-26 LAB — CBC WITH DIFFERENTIAL/PLATELET
Absolute Monocytes: 782 cells/uL (ref 200–950)
Basophils Absolute: 47 cells/uL (ref 0–200)
Basophils Relative: 0.6 %
Eosinophils Absolute: 292 cells/uL (ref 15–500)
Eosinophils Relative: 3.7 %
HCT: 36.5 % (ref 35.0–45.0)
Hemoglobin: 12 g/dL (ref 11.7–15.5)
Lymphs Abs: 1533 cells/uL (ref 850–3900)
MCH: 28 pg (ref 27.0–33.0)
MCHC: 32.9 g/dL (ref 32.0–36.0)
MCV: 85.3 fL (ref 80.0–100.0)
MPV: 11.3 fL (ref 7.5–12.5)
Monocytes Relative: 9.9 %
Neutro Abs: 5246 cells/uL (ref 1500–7800)
Neutrophils Relative %: 66.4 %
Platelets: 354 10*3/uL (ref 140–400)
RBC: 4.28 10*6/uL (ref 3.80–5.10)
RDW: 13 % (ref 11.0–15.0)
Total Lymphocyte: 19.4 %
WBC: 7.9 10*3/uL (ref 3.8–10.8)

## 2019-05-26 NOTE — Progress Notes (Signed)
Hoyle Sauer,   CBC looks great. Will call with culture when comes in.

## 2019-05-27 ENCOUNTER — Telehealth: Payer: Self-pay | Admitting: Neurology

## 2019-05-27 LAB — URINE CULTURE
MICRO NUMBER:: 10352357
SPECIMEN QUALITY:: ADEQUATE

## 2019-05-27 NOTE — Telephone Encounter (Signed)
Can occur with antibiotic usage but I would take some immodium while you finish up abx.

## 2019-05-27 NOTE — Telephone Encounter (Signed)
Patient started antibiotic for UTI Monday, also started probiotic yesterday, but having a lot of diarrhea and wants to know if there is something else she should be doing. Please advise.

## 2019-05-27 NOTE — Telephone Encounter (Signed)
Patient made aware.

## 2019-05-27 NOTE — Progress Notes (Signed)
Arali,   Your urine culture came back with E.coli in it but sensitive to augmentin. Should clear up any infection.   Luvenia Starch

## 2019-06-03 ENCOUNTER — Telehealth: Payer: Self-pay | Admitting: Neurology

## 2019-06-03 DIAGNOSIS — N390 Urinary tract infection, site not specified: Secondary | ICD-10-CM

## 2019-06-03 NOTE — Telephone Encounter (Signed)
Patient finishing antibiotics for UTI today. She wants to come in to check Urine/culture to make sure it is cleared. She wants to know if we will order and how soon after finishing antibiotics she should get checked. Please advise.

## 2019-06-04 LAB — HM DIABETES EYE EXAM

## 2019-06-04 NOTE — Telephone Encounter (Signed)
Patient made aware. Order placed and sent to lab.

## 2019-06-04 NOTE — Telephone Encounter (Signed)
Ok to go down stairs for urine culture.Anytime after she finished abx is fine.

## 2019-06-05 DIAGNOSIS — H04123 Dry eye syndrome of bilateral lacrimal glands: Secondary | ICD-10-CM | POA: Diagnosis not present

## 2019-06-05 DIAGNOSIS — N390 Urinary tract infection, site not specified: Secondary | ICD-10-CM | POA: Diagnosis not present

## 2019-06-05 DIAGNOSIS — H43813 Vitreous degeneration, bilateral: Secondary | ICD-10-CM | POA: Diagnosis not present

## 2019-06-05 DIAGNOSIS — H25813 Combined forms of age-related cataract, bilateral: Secondary | ICD-10-CM | POA: Diagnosis not present

## 2019-06-06 LAB — URINALYSIS, ROUTINE W REFLEX MICROSCOPIC
Bilirubin Urine: NEGATIVE
Glucose, UA: NEGATIVE
Hgb urine dipstick: NEGATIVE
Ketones, ur: NEGATIVE
Leukocytes,Ua: NEGATIVE
Nitrite: NEGATIVE
Protein, ur: NEGATIVE
Specific Gravity, Urine: 1.021 (ref 1.001–1.03)
pH: 6.5 (ref 5.0–8.0)

## 2019-06-06 LAB — URINE CULTURE
MICRO NUMBER:: 10401047
Result:: NO GROWTH
SPECIMEN QUALITY:: ADEQUATE

## 2019-06-06 NOTE — Telephone Encounter (Signed)
No bacteria detected in urine.

## 2019-06-06 NOTE — Telephone Encounter (Signed)
No urine bacteria growth.

## 2019-06-10 ENCOUNTER — Other Ambulatory Visit: Payer: Self-pay

## 2019-06-10 ENCOUNTER — Ambulatory Visit (INDEPENDENT_AMBULATORY_CARE_PROVIDER_SITE_OTHER): Payer: Medicare Other

## 2019-06-10 DIAGNOSIS — Z780 Asymptomatic menopausal state: Secondary | ICD-10-CM

## 2019-06-10 DIAGNOSIS — Z0000 Encounter for general adult medical examination without abnormal findings: Secondary | ICD-10-CM | POA: Diagnosis not present

## 2019-06-10 DIAGNOSIS — Z13820 Encounter for screening for osteoporosis: Secondary | ICD-10-CM | POA: Diagnosis not present

## 2019-06-10 DIAGNOSIS — M85832 Other specified disorders of bone density and structure, left forearm: Secondary | ICD-10-CM | POA: Diagnosis not present

## 2019-06-11 ENCOUNTER — Other Ambulatory Visit: Payer: Self-pay | Admitting: Family Medicine

## 2019-06-16 DIAGNOSIS — F410 Panic disorder [episodic paroxysmal anxiety] without agoraphobia: Secondary | ICD-10-CM | POA: Diagnosis not present

## 2019-06-16 DIAGNOSIS — F422 Mixed obsessional thoughts and acts: Secondary | ICD-10-CM | POA: Diagnosis not present

## 2019-06-16 DIAGNOSIS — F902 Attention-deficit hyperactivity disorder, combined type: Secondary | ICD-10-CM | POA: Diagnosis not present

## 2019-07-02 DIAGNOSIS — I10 Essential (primary) hypertension: Secondary | ICD-10-CM | POA: Diagnosis not present

## 2019-07-02 DIAGNOSIS — E039 Hypothyroidism, unspecified: Secondary | ICD-10-CM | POA: Diagnosis not present

## 2019-07-02 DIAGNOSIS — R7301 Impaired fasting glucose: Secondary | ICD-10-CM | POA: Diagnosis not present

## 2019-07-02 DIAGNOSIS — E7800 Pure hypercholesterolemia, unspecified: Secondary | ICD-10-CM | POA: Diagnosis not present

## 2019-07-02 LAB — HEMOGLOBIN A1C: Hemoglobin A1C: 5.9

## 2019-07-09 DIAGNOSIS — L659 Nonscarring hair loss, unspecified: Secondary | ICD-10-CM | POA: Diagnosis not present

## 2019-07-09 DIAGNOSIS — E7800 Pure hypercholesterolemia, unspecified: Secondary | ICD-10-CM | POA: Diagnosis not present

## 2019-07-09 DIAGNOSIS — R7301 Impaired fasting glucose: Secondary | ICD-10-CM | POA: Diagnosis not present

## 2019-07-09 DIAGNOSIS — E559 Vitamin D deficiency, unspecified: Secondary | ICD-10-CM | POA: Diagnosis not present

## 2019-07-09 DIAGNOSIS — R5382 Chronic fatigue, unspecified: Secondary | ICD-10-CM | POA: Diagnosis not present

## 2019-07-09 DIAGNOSIS — I10 Essential (primary) hypertension: Secondary | ICD-10-CM | POA: Diagnosis not present

## 2019-07-09 DIAGNOSIS — E039 Hypothyroidism, unspecified: Secondary | ICD-10-CM | POA: Diagnosis not present

## 2019-07-16 DIAGNOSIS — L602 Onychogryphosis: Secondary | ICD-10-CM | POA: Diagnosis not present

## 2019-07-16 DIAGNOSIS — L600 Ingrowing nail: Secondary | ICD-10-CM | POA: Diagnosis not present

## 2019-07-16 DIAGNOSIS — B351 Tinea unguium: Secondary | ICD-10-CM | POA: Diagnosis not present

## 2019-07-21 DIAGNOSIS — F410 Panic disorder [episodic paroxysmal anxiety] without agoraphobia: Secondary | ICD-10-CM | POA: Diagnosis not present

## 2019-07-21 DIAGNOSIS — F422 Mixed obsessional thoughts and acts: Secondary | ICD-10-CM | POA: Diagnosis not present

## 2019-07-21 DIAGNOSIS — F902 Attention-deficit hyperactivity disorder, combined type: Secondary | ICD-10-CM | POA: Diagnosis not present

## 2019-08-06 DIAGNOSIS — K648 Other hemorrhoids: Secondary | ICD-10-CM | POA: Diagnosis not present

## 2019-08-06 DIAGNOSIS — Z800 Family history of malignant neoplasm of digestive organs: Secondary | ICD-10-CM | POA: Diagnosis not present

## 2019-08-06 DIAGNOSIS — K5140 Inflammatory polyps of colon without complications: Secondary | ICD-10-CM | POA: Diagnosis not present

## 2019-08-06 DIAGNOSIS — D125 Benign neoplasm of sigmoid colon: Secondary | ICD-10-CM | POA: Diagnosis not present

## 2019-08-06 DIAGNOSIS — K635 Polyp of colon: Secondary | ICD-10-CM | POA: Diagnosis not present

## 2019-08-06 DIAGNOSIS — D120 Benign neoplasm of cecum: Secondary | ICD-10-CM | POA: Diagnosis not present

## 2019-08-06 DIAGNOSIS — R1030 Lower abdominal pain, unspecified: Secondary | ICD-10-CM | POA: Diagnosis not present

## 2019-08-06 DIAGNOSIS — K219 Gastro-esophageal reflux disease without esophagitis: Secondary | ICD-10-CM | POA: Diagnosis not present

## 2019-08-06 DIAGNOSIS — K625 Hemorrhage of anus and rectum: Secondary | ICD-10-CM | POA: Diagnosis not present

## 2019-08-06 LAB — HM COLONOSCOPY

## 2019-08-07 ENCOUNTER — Encounter: Payer: Self-pay | Admitting: Family Medicine

## 2019-08-18 DIAGNOSIS — F902 Attention-deficit hyperactivity disorder, combined type: Secondary | ICD-10-CM | POA: Diagnosis not present

## 2019-08-18 DIAGNOSIS — F422 Mixed obsessional thoughts and acts: Secondary | ICD-10-CM | POA: Diagnosis not present

## 2019-08-18 DIAGNOSIS — F410 Panic disorder [episodic paroxysmal anxiety] without agoraphobia: Secondary | ICD-10-CM | POA: Diagnosis not present

## 2019-08-30 ENCOUNTER — Other Ambulatory Visit: Payer: Self-pay | Admitting: Family Medicine

## 2019-09-02 ENCOUNTER — Encounter: Payer: Self-pay | Admitting: Family Medicine

## 2019-09-06 DIAGNOSIS — Z20822 Contact with and (suspected) exposure to covid-19: Secondary | ICD-10-CM | POA: Diagnosis not present

## 2019-09-09 ENCOUNTER — Other Ambulatory Visit: Payer: Self-pay

## 2019-09-09 ENCOUNTER — Encounter: Payer: Self-pay | Admitting: Family Medicine

## 2019-09-09 ENCOUNTER — Ambulatory Visit (INDEPENDENT_AMBULATORY_CARE_PROVIDER_SITE_OTHER): Payer: Medicare Other | Admitting: Family Medicine

## 2019-09-09 VITALS — BP 125/61 | HR 80 | Temp 98.2°F | Ht 61.81 in | Wt 183.0 lb

## 2019-09-09 DIAGNOSIS — N390 Urinary tract infection, site not specified: Secondary | ICD-10-CM | POA: Diagnosis not present

## 2019-09-09 DIAGNOSIS — N3000 Acute cystitis without hematuria: Secondary | ICD-10-CM | POA: Insufficient documentation

## 2019-09-09 LAB — POCT URINALYSIS DIP (CLINITEK)
Bilirubin, UA: NEGATIVE
Glucose, UA: NEGATIVE mg/dL
Ketones, POC UA: NEGATIVE mg/dL
Nitrite, UA: NEGATIVE
POC PROTEIN,UA: >=300 — AB
Spec Grav, UA: 1.01 (ref 1.010–1.025)
Urobilinogen, UA: 0.2 E.U./dL
pH, UA: 5.5 (ref 5.0–8.0)

## 2019-09-09 MED ORDER — CEPHALEXIN 500 MG PO CAPS: 500.0000 mg | ORAL_CAPSULE | Freq: Two times a day (BID) | ORAL | 0 refills | Status: DC

## 2019-09-09 NOTE — Progress Notes (Signed)
[name] - 74 y.o. female MRN [id number]  Date of birth: 08/22/1945  Subjective Chief Complaint  Patient presents with  . Urinary Tract Infection  . Fever  . Fatigue    HPI [name] is a 74 y.o. female with history of HTN, RA and hypothyroidism here today with complaint of urinary frequency, urgency and dysuria.  He symptoms started last week.  She developed fever to 101 over the weekend as well as some fatigue. Fever lasted until last night.  She had COVID test on 7/25 which returned negative.  She denies increased respiratory symptoms including cough or shortness of breath.  She has not had headaches or body aches.  She denies increased back pain.    ROS:  A comprehensive ROS was completed and negative except as noted per HPI     Allergies  Allergen Reactions  . Astelin [Azelastine Hcl] Other (See Comments)    Headache  . Celebrex [Celecoxib] Other (See Comments)    Stomach issues  . Lisinopril Other (See Comments) and Cough    ACE cough  . Methotrexate Derivatives Other (See Comments)  . Morphine Nausea Only  . Moxifloxacin Other (See Comments)    REACTION: confusion, dizziness, paranoia    Past Medical History:  Diagnosis Date  . Allergic rhinitis, cause unspecified   . Anxiety   . Benign paroxysmal positional vertigo   . Cancer ([place])    basal cell carcinoma per right side of nostril  . CHF (congestive heart failure) ([place]) 11/09/2016   had 2+ edema in legs with anxiety and SOB  . Complication of anesthesia    has 4  degenerative discs in neck.  . Depressive disorder, not elsewhere classified   . Esophageal reflux   . History of hiatal hernia   . Hypertension   . Localized osteoarthrosis not specified whether primary or secondary, lower leg   . Other and unspecified hyperlipidemia   . Other diseases of vocal cords   . Peptic ulcer, unspecified site, unspecified as acute or chronic, without mention of hemorrhage, perforation, or obstruction   .  Pneumonia    bilat pneumonia 1987  . Pre-diabetes    no meds,just diet controll  . TMJ (temporomandibular joint disorder)   . Unspecified arthropathy, hand   . Unspecified asthma(493.90)    triggered with a virus   . Unspecified hypothyroidism     Past Surgical History:  Procedure Laterality Date  . BREAST BIOPSY  09/07/2011   High Risk Lesion  . BREAST EXCISIONAL BIOPSY Left   . BREAST SURGERY     LUMPECTOMY / LEFT 10/12/2011  . KNEE ARTHROSCOPY     bilat   . NASAL SINUS SURGERY    . ROTATOR CUFF REPAIR     right   . thumb surgery      left hand 45 years ago   . TOTAL HIP ARTHROPLASTY Right 08/10/2014   Procedure: RIGHT TOTAL HIP ARTHROPLASTY ANTERIOR APPROACH;  Surgeon: Paralee Cancel, MD;  Location: [facility];  Service: Orthopedics;  Laterality: Right;  . TOTAL KNEE ARTHROPLASTY Left 08/26/2018   Procedure: TOTAL KNEE ARTHROPLASTY;  Surgeon: Paralee Cancel, MD;  Location: [facility];  Service: Orthopedics;  Laterality: Left;  70 mins    Social History   Socioeconomic History  . Marital status: Married    Spouse name: [name]  . Number of children: 3  . Years of education: 16  . Highest education level: Bachelor's degree (e.g., BA, AB, BS)  Occupational History  . Occupation:  Retired    Comment: Pharmacist, hospital  Tobacco Use  . Smoking status: Former Smoker    Packs/day: 0.30    Years: 2.00    Pack years: 0.60    Types: Cigarettes    Quit date: 02/12/1958    Years since quitting: 61.6  . Smokeless tobacco: Never Used  Vaping Use  . Vaping Use: Never used  Substance and Sexual Activity  . Alcohol use: Yes    Alcohol/week: 1.0 standard drink    Types: 1 Glasses of wine per week    Comment: wine socially  . Drug use: No  . Sexual activity: Yes  Other Topics Concern  . Not on file  Social History Narrative   Helps watch grand babys   Social Determinants of Health   Financial Resource Strain:   . Difficulty of Paying Living Expenses:   Food Insecurity:   . Worried About  Charity fundraiser in the Last Year:   . Arboriculturist in the Last Year:   Transportation Needs:   . Film/video editor (Medical):   Marland Kitchen Lack of Transportation (Non-Medical):   Physical Activity:   . Days of Exercise per Week:   . Minutes of Exercise per Session:   Stress:   . Feeling of Stress :   Social Connections:   . Frequency of Communication with Friends and Family:   . Frequency of Social Gatherings with Friends and Family:   . Attends Religious Services:   . Active Member of Clubs or Organizations:   . Attends Archivist Meetings:   Marland Kitchen Marital Status:     Family History  Problem Relation Age of Onset  . Heart disease Father   . Colon cancer Mother   . Liver cancer Brother   . Asthma Brother     Health Maintenance  Topic Date Due  . INFLUENZA VACCINE  09/13/2019  . TETANUS/TDAP  09/24/2022  . COLONOSCOPY  08/05/2024  . DEXA SCAN  Completed  . COVID-19 Vaccine  Completed  . Hepatitis C Screening  Completed  . PNA vac Low Risk Adult  Completed     ----------------------------------------------------------------------------------------------------------------------------------------------------------------------------------------------------------------- Physical Exam BP (!) 125/61 (BP Location: Left Arm, Patient Position: Sitting, Cuff Size: Normal)   Pulse 80   Temp 98.2 F (36.8 C) (Oral)   Ht 5' 1.81" (1.57 m)   Wt 183 lb (83 kg)   SpO2 97%   BMI 33.68 kg/m   Physical Exam Constitutional:      Appearance: Normal appearance.  HENT:     Head: Normocephalic and atraumatic.  Eyes:     General: No scleral icterus. Cardiovascular:     Rate and Rhythm: Normal rate and regular rhythm.  Pulmonary:     Effort: Pulmonary effort is normal.     Breath sounds: Normal breath sounds.  Abdominal:     General: Abdomen is flat. There is no distension.     Palpations: Abdomen is soft.     Tenderness: There is no abdominal tenderness. There is no  right CVA tenderness or left CVA tenderness.  Musculoskeletal:     Cervical back: Neck supple.  Neurological:     General: No focal deficit present.     Mental Status: She is alert.  Psychiatric:        Mood and Affect: Mood normal.        Behavior: Behavior normal.     ------------------------------------------------------------------------------------------------------------------------------------------------------------------------------------------------------------------- Assessment and Plan  Acute cystitis UA and symptoms consistent with UTI.  No  other symptoms to suggest alternative source of fever.  No flank or increased back pain. No CVA tenderness.  Does not appear to be pyelonephritis.  Will start 7 day course of cephalexin Recommend increased fluids.  Urine sent for culture and will update plan as needed based on results.     Meds ordered this encounter  Medications  . cephALEXin (KEFLEX) 500 MG capsule    Sig: Take 1 capsule (500 mg total) by mouth 2 (two) times daily for 7 days.    Dispense:  14 capsule    Refill:  0    No follow-ups on file.    This visit occurred during the SARS-CoV-2 public health emergency.  Safety protocols were in place, including screening questions prior to the visit, additional usage of staff PPE, and extensive cleaning of exam room while observing appropriate contact time as indicated for disinfecting solutions.

## 2019-09-09 NOTE — Patient Instructions (Signed)
Urinary Tract Infection, Adult A urinary tract infection (UTI) is an infection of any part of the urinary tract. The urinary tract includes:  The kidneys.  The ureters.  The bladder.  The urethra. These organs make, store, and get rid of pee (urine) in the body. What are the causes? This is caused by germs (bacteria) in your genital area. These germs grow and cause swelling (inflammation) of your urinary tract. What increases the risk? You are more likely to develop this condition if:  You have a small, thin tube (catheter) to drain pee.  You cannot control when you pee or poop (incontinence).  You are female, and: ? You use these methods to prevent pregnancy:  A medicine that kills sperm (spermicide).  A device that blocks sperm (diaphragm). ? You have low levels of a female hormone (estrogen). ? You are pregnant.  You have genes that add to your risk.  You are sexually active.  You take antibiotic medicines.  You have trouble peeing because of: ? A prostate that is bigger than normal, if you are female. ? A blockage in the part of your body that drains pee from the bladder (urethra). ? A kidney stone. ? A nerve condition that affects your bladder (neurogenic bladder). ? Not getting enough to drink. ? Not peeing often enough.  You have other conditions, such as: ? Diabetes. ? A weak disease-fighting system (immune system). ? Sickle cell disease. ? Gout. ? Injury of the spine. What are the signs or symptoms? Symptoms of this condition include:  Needing to pee right away (urgently).  Peeing often.  Peeing small amounts often.  Pain or burning when peeing.  Blood in the pee.  Pee that smells bad or not like normal.  Trouble peeing.  Pee that is cloudy.  Fluid coming from the vagina, if you are female.  Pain in the belly or lower back. Other symptoms include:  Throwing up (vomiting).  No urge to eat.  Feeling mixed up (confused).  Being tired  and grouchy (irritable).  A fever.  Watery poop (diarrhea). How is this treated? This condition may be treated with:  Antibiotic medicine.  Other medicines.  Drinking enough water. Follow these instructions at home:  Medicines  Take over-the-counter and prescription medicines only as told by your doctor.  If you were prescribed an antibiotic medicine, take it as told by your doctor. Do not stop taking it even if you start to feel better. General instructions  Make sure you: ? Pee until your bladder is empty. ? Do not hold pee for a long time. ? Empty your bladder after sex. ? Wipe from front to back after pooping if you are a female. Use each tissue one time when you wipe.  Drink enough fluid to keep your pee pale yellow.  Keep all follow-up visits as told by your doctor. This is important. Contact a doctor if:  You do not get better after 1-2 days.  Your symptoms go away and then come back. Get help right away if:  You have very bad back pain.  You have very bad pain in your lower belly.  You have a fever.  You are sick to your stomach (nauseous).  You are throwing up. Summary  A urinary tract infection (UTI) is an infection of any part of the urinary tract.  This condition is caused by germs in your genital area.  There are many risk factors for a UTI. These include having a small, thin   tube to drain pee and not being able to control when you pee or poop.  Treatment includes antibiotic medicines for germs.  Drink enough fluid to keep your pee pale yellow. This information is not intended to replace advice given to you by your health care provider. Make sure you discuss any questions you have with your health care provider. Document Revised: 01/16/2018 Document Reviewed: 08/08/2017 Elsevier Patient Education  2020 [employer]  

## 2019-09-09 NOTE — Assessment & Plan Note (Addendum)
UA and symptoms consistent with UTI.  No other symptoms to suggest alternative source of fever.  No flank or increased back pain. No CVA tenderness.  Does not appear to be pyelonephritis.  Will start 7 day course of cephalexin Recommend increased fluids.  Urine sent for culture and will update plan as needed based on results.

## 2019-09-11 LAB — URINE CULTURE
MICRO NUMBER:: 10760805
SPECIMEN QUALITY:: ADEQUATE

## 2019-09-12 ENCOUNTER — Emergency Department (INDEPENDENT_AMBULATORY_CARE_PROVIDER_SITE_OTHER): Payer: Medicare Other

## 2019-09-12 ENCOUNTER — Other Ambulatory Visit: Payer: Self-pay

## 2019-09-12 ENCOUNTER — Emergency Department (INDEPENDENT_AMBULATORY_CARE_PROVIDER_SITE_OTHER)
Admission: EM | Admit: 2019-09-12 | Discharge: 2019-09-12 | Disposition: A | Discharge status: Home / Self Care | Payer: Medicare Other | Source: Home / Self Care

## 2019-09-12 DIAGNOSIS — R509 Fever, unspecified: Secondary | ICD-10-CM | POA: Diagnosis not present

## 2019-09-12 DIAGNOSIS — R6889 Other general symptoms and signs: Secondary | ICD-10-CM

## 2019-09-12 LAB — COMPLETE METABOLIC PANEL WITH GFR
AG Ratio: 1.5 (calc) (ref 1.0–2.5)
ALT: 25 U/L (ref 6–29)
AST: 14 U/L (ref 10–35)
Albumin: 3.8 g/dL (ref 3.6–5.1)
Alkaline phosphatase (APISO): 62 U/L (ref 37–153)
BUN: 17 mg/dL (ref 7–25)
CO2: 28 mmol/L (ref 20–32)
Calcium: 9.4 mg/dL (ref 8.6–10.4)
Chloride: 102 mmol/L (ref 98–110)
Creat: 0.72 mg/dL (ref 0.60–0.93)
GFR, Est African American: 95 mL/min/{1.73_m2} (ref >=60)
GFR, Est Non African American: 82 mL/min/{1.73_m2} (ref >=60)
Globulin: 2.6 g/dL (calc) (ref 1.9–3.7)
Glucose, Bld: 90 mg/dL (ref 65–99)
Potassium: 4.2 mmol/L (ref 3.5–5.3)
Sodium: 138 mmol/L (ref 135–146)
Total Bilirubin: 0.4 mg/dL (ref 0.2–1.2)
Total Protein: 6.4 g/dL (ref 6.1–8.1)

## 2019-09-12 LAB — POCT URINALYSIS DIP (MANUAL ENTRY)
Bilirubin, UA: NEGATIVE
Blood, UA: NEGATIVE
Glucose, UA: NEGATIVE mg/dL
Ketones, POC UA: NEGATIVE mg/dL
Nitrite, UA: NEGATIVE
Protein Ur, POC: NEGATIVE mg/dL
Spec Grav, UA: 1.02 (ref 1.010–1.025)
Urobilinogen, UA: 0.2 E.U./dL
pH, UA: 5.5 (ref 5.0–8.0)

## 2019-09-12 LAB — POCT CBC W AUTO DIFF (K'VILLE URGENT CARE)

## 2019-09-12 LAB — POCT INFLUENZA A/B
Influenza A, POC: NEGATIVE
Influenza B, POC: NEGATIVE

## 2019-09-12 NOTE — Discharge Instructions (Signed)
  You may continue to take Tylenol as needed for fever and pain. Be sure to keep taking the cephalexin prescribed for your UTI Call to schedule a follow up appointment with Primary care early this week for recheck of symptoms.  Be sure to stay well hydrated and get at least 8 hours of sleep at night. Preferably more when sick.  Call 911 or have someone drive you to the hospital if symptoms worsening- dizziness, passing out, severe headache, chest pain, trouble breathing, unable to keep down fluids, or other new concerning symptoms develop.

## 2019-09-12 NOTE — ED Provider Notes (Signed)
[name]    CSN: [id number] Arrival date & time: 09/12/19  1248      History   Chief Complaint Chief Complaint  Patient presents with  . Fever  . Generalized Body Aches    HPI [name] is a 74 y.o. female.   HPI  [name] is a 74 y.o. female presenting to UC with c/o intermittent fever for 1 week. Tuesday, 7/27 fever Tmax 101.3*F.  She was seen by her PCP, Dr. Zigmund Fry, dx with a UTI, started on cephalexin 500mg  bid, has taken 3 days of the antibiotic and tylenol at night with temporary relief.  Pt had a negative send out covid test on Sunday after having symptoms for 3-4 days. Today, pt feels fatigued with body aches, mild congestion. Pt feels like she has the flu, requesting a flu test. Denies sick contacts or recent travel.  She has received the Covid vaccine. Denies chest pain or SOB. Denies potential tick bites or rashes.   Past Medical History:  Diagnosis Date  . Allergic rhinitis, cause unspecified   . Anxiety   . Benign paroxysmal positional vertigo   . Cancer ([place])    basal cell carcinoma per right side of nostril  . CHF (congestive heart failure) ([place]) 11/09/2016   had 2+ edema in legs with anxiety and SOB  . Complication of anesthesia    has 4  degenerative discs in neck.  . Depressive disorder, not elsewhere classified   . Esophageal reflux   . History of hiatal hernia   . Hypertension   . Localized osteoarthrosis not specified whether primary or secondary, lower leg   . Other and unspecified hyperlipidemia   . Other diseases of vocal cords   . Peptic ulcer, unspecified site, unspecified as acute or chronic, without mention of hemorrhage, perforation, or obstruction   . Pneumonia    bilat pneumonia 1987  . Pre-diabetes    no meds,just diet controll  . TMJ (temporomandibular joint disorder)   . Unspecified arthropathy, hand   . Unspecified asthma(493.90)    triggered with a virus   . Unspecified hypothyroidism     Patient  Active Problem List   Diagnosis Date Noted  . Acute cystitis 09/09/2019  . Drug-induced constipation 05/25/2019  . Diarrhea 05/25/2019  . Lower abdominal pain 05/25/2019  . Blood in stool 05/25/2019  . Internal hemorrhoids 05/25/2019  . Family history of colon cancer 05/25/2019  . Lumbar degenerative disc disease 11/07/2018  . Cervical pain 11/07/2018  . Overweight (BMI 25.0-29.9) 08/27/2018  . S/P left TKA 08/26/2018  . Pain in right knee 01/03/2018  . Aortic atherosclerosis ([place]) 09/26/2017  . Fatty liver 11/09/2016  . Vitamin D deficiency 06/19/2016  . Chronic fatigue 06/19/2016  . Alopecia 06/19/2016  . Tendon nodule 01/16/2016  . Venous stasis ulcer of left lower extremity ([place]) 03/03/2015  . S/P right THA, AA 08/10/2014  . Primary osteoarthritis of right hip 05/27/2014  . Essential hypertension, benign 05/26/2014  . IFG (impaired fasting glucose) 01/25/2014  . Vitreous detachment 09/23/2012  . Oral herpes 06/23/2012  . OCD (obsessive compulsive disorder) 05/09/2012  . Atypical ductal hyperplasia of breast 10/29/2011  . Abnormal mammogram with microcalcification 10/04/2011  . Obesity (BMI 30-39.9) 05/03/2011  . CARRIER/SUSPECTED CARRIER GROUP B STREPTOCOCCUS 11/17/2010  . Abnormal liver enzymes 10/25/2010  . SOMNOLENCE 11/25/2009  . Rheumatoid arthritis with rheumatoid factor ([place]) 11/30/2008  . POSTMENOPAUSAL STATUS 11/30/2008  . LOC OSTEOARTHROS NOT SPEC PRIM/SEC LOWER LEG 03/19/2007  . Allergic  rhinitis 11/05/2006  . VOCAL CORD DISORDER 11/05/2006  . Asthma with allergic rhinitis 11/05/2006  . Hypothyroidism 09/24/2006  . DEPRESSION 09/24/2006  . VERTIGO, BENIGN PAROXYSMAL POSITION 09/24/2006  . GERD 09/24/2006  . HYPERLIPIDEMIA 09/23/2006    Past Surgical History:  Procedure Laterality Date  . BREAST BIOPSY  09/07/2011   High Risk Lesion  . BREAST EXCISIONAL BIOPSY Left   . BREAST SURGERY     LUMPECTOMY / LEFT 10/12/2011  . KNEE ARTHROSCOPY     bilat    . NASAL SINUS SURGERY    . ROTATOR CUFF REPAIR     right   . thumb surgery      left hand 45 years ago   . TOTAL HIP ARTHROPLASTY Right 08/10/2014   Procedure: RIGHT TOTAL HIP ARTHROPLASTY ANTERIOR APPROACH;  Surgeon: Paralee Cancel, MD;  Location: [facility];  Service: Orthopedics;  Laterality: Right;  . TOTAL KNEE ARTHROPLASTY Left 08/26/2018   Procedure: TOTAL KNEE ARTHROPLASTY;  Surgeon: Paralee Cancel, MD;  Location: [facility];  Service: Orthopedics;  Laterality: Left;  70 mins    OB History   No obstetric history on file.      Home Medications    Prior to Admission medications   Medication Sig Start Date End Date Taking? Authorizing Provider  albuterol (PROVENTIL HFA) 108 (90 Base) MCG/ACT inhaler Inhale 2 puffs into the lungs every 6 (six) hours as needed for shortness of breath. 05/05/19   Rigoberto Noel, MD  Ascorbic Acid (VITAMIN C WITH ROSE HIPS) 1000 MG tablet Take 1,000 mg by mouth daily.    [provider]  budesonide-formoterol (SYMBICORT) 80-4.5 MCG/ACT inhaler Inhale 2 puffs into the lungs 2 (two) times daily. 05/05/19   Rigoberto Noel, MD  buPROPion (WELLBUTRIN XL) 150 MG 24 hr tablet Take 150 mg by mouth daily.     [provider]  Calcium Carb-Cholecalciferol (CALCIUM 600+D3 PO) Take 1 tablet by mouth 2 (two) times a day.    [provider]  Carboxymethylcell-Hypromellose (GENTEAL OP) Place 1-2 drops into both eyes 3 (three) times daily as needed (for dry eyes).    [provider]  cephALEXin (KEFLEX) 500 MG capsule Take 1 capsule (500 mg total) by mouth 2 (two) times daily for 7 days. 09/09/19 09/16/19  Luetta Nutting, DO  Cholecalciferol (VITAMIN D3) 50 MCG (2000 UT) capsule Take 200 Units by mouth daily.     [provider]  clonazePAM (KLONOPIN) 0.5 MG tablet Take 0.25 mg by mouth 2 (two) times daily as needed for anxiety.    [provider]  famotidine (PEPCID) 20 MG tablet Take 20 mg by mouth at bedtime.     [provider]  fexofenadine (ALLEGRA) 180 MG tablet Take 180 mg by mouth at bedtime.    [provider]  folic acid (FOLVITE) 1 MG tablet Take 1 mg by mouth 2 (two) times daily.      [provider]  Ginkgo Biloba 80 MG CAPS Take 400 mg by mouth daily.     [provider]  hydrochlorothiazide (HYDRODIURIL) 25 MG tablet TAKE 1 TABLET BY MOUTH EVERY DAY 02/02/19   Hali Marry, MD  L-Lysine HCl 500 MG TABS Take 500 mg by mouth daily.     [provider]  levothyroxine (SYNTHROID, LEVOTHROID) 75 MCG tablet Take 75 mcg by mouth See admin instructions. Take 75 mcg by mouth daily on all days except Sunday    [provider]  levothyroxine (SYNTHROID, [place])  88 MCG tablet Take 88 mcg by mouth every Sunday.     [provider]  losartan (COZAAR) 50 MG tablet Take 1 tablet (50 mg total) by mouth daily. 11/26/18   Hali Marry, MD  Magnesium 100 MG TABS Take 150 mg by mouth 2 (two) times a day.    [provider]  meloxicam (MOBIC) 15 MG tablet TAKE 1 TABLET (15 MG TOTAL) BY MOUTH EVERY MORNING. 09/01/19   Hali Marry, MD  metroNIDAZOLE (METROGEL) 1 % gel Apply topically daily. Patient taking differently: Apply 1 application topically as needed.  09/22/18   Hali Marry, MD  minoxidil (ROGAINE) 2 % external solution Apply topically 2 (two) times daily.    [provider]  OVER THE COUNTER MEDICATION Take 1 tablet by mouth daily. Super Joint Support Supplement    [provider]  pantoprazole (PROTONIX) 40 MG tablet TAKE 1 TABLET BY MOUTH EVERY DAY 02/02/19   Hali Marry, MD  PARoxetine (PAXIL-CR) 12.5 MG 24 hr tablet Take 25 mg by mouth 2 (two) times daily.     [provider]    Family History Family History  Problem Relation Age of Onset  . Heart disease Father   . Colon cancer Mother   . Liver cancer Brother   . Asthma Brother     Social History Social  History   Tobacco Use  . Smoking status: Former Smoker    Packs/day: 0.30    Years: 2.00    Pack years: 0.60    Types: Cigarettes    Quit date: 02/12/1958    Years since quitting: 61.6  . Smokeless tobacco: Never Used  Vaping Use  . Vaping Use: Never used  Substance Use Topics  . Alcohol use: Yes    Alcohol/week: 1.0 standard drink    Types: 1 Glasses of wine per week    Comment: wine socially  . Drug use: No     Allergies   Astelin [azelastine hcl], Celebrex [celecoxib], Lisinopril, Methotrexate derivatives, Morphine, and Moxifloxacin   Review of Systems Review of Systems  Constitutional: Positive for fatigue and fever. Negative for chills.  HENT: Positive for congestion. Negative for ear pain, sore throat, trouble swallowing and voice change.   Respiratory: Positive for cough. Negative for shortness of breath.   Cardiovascular: Negative for chest pain and palpitations.  Gastrointestinal: Negative for abdominal pain, diarrhea, nausea and vomiting.  Genitourinary: Positive for frequency. Negative for decreased urine volume and dysuria.  Musculoskeletal: Positive for arthralgias, back pain and myalgias.       Body aches  Skin: Negative for rash.  Neurological: Positive for weakness (generalized). Negative for dizziness, light-headedness and headaches.  All other systems reviewed and are negative.    Physical Exam Triage Vital Signs ED Triage Vitals  Enc Vitals Group     BP 09/12/19 1259 (!) 130/66     Pulse Rate 09/12/19 1259 92     Resp 09/12/19 1259 20     Temp 09/12/19 1259 99 F (37.2 C)     Temp Source 09/12/19 1259 Oral     SpO2 09/12/19 1259 95 %     Weight --      Height --      Head Circumference --      Peak Flow --      Pain Score 09/12/19 1305 5     Pain Loc --      Pain Edu? --      Excl. in  GC? --    Orthostatic VS for the past 24 hrs:  BP- Lying Pulse- Lying BP- Sitting Pulse- Sitting BP- Standing at 0 minutes Pulse- Standing at 0 minutes   09/12/19 1454 131/79 83 126/73 86 132/76 90    Updated Vital Signs BP (!) 130/66 (BP Location: Left Arm)   Pulse 92   Temp 99 F (37.2 C) (Oral)   Resp 20   SpO2 95%   Visual Acuity Right Eye Distance:   Left Eye Distance:   Bilateral Distance:    Right Eye Near:   Left Eye Near:    Bilateral Near:     Physical Exam Vitals and nursing note reviewed.  Constitutional:      Appearance: Normal appearance. She is well-developed.  HENT:     Head: Normocephalic and atraumatic.     Right Ear: Tympanic membrane and ear canal normal.     Left Ear: Tympanic membrane and ear canal normal.     Nose: Nose normal.     Right Sinus: No maxillary sinus tenderness or frontal sinus tenderness.     Left Sinus: No maxillary sinus tenderness or frontal sinus tenderness.     Mouth/Throat:     Lips: Pink.     Mouth: Mucous membranes are moist.     Pharynx: Oropharynx is clear. Uvula midline.  Cardiovascular:     Rate and Rhythm: Normal rate and regular rhythm.  Pulmonary:     Effort: Pulmonary effort is normal. No respiratory distress.     Breath sounds: Normal breath sounds. No stridor. No wheezing, rhonchi or rales.  Musculoskeletal:        General: Normal range of motion.     Cervical back: Normal range of motion.  Skin:    General: Skin is warm and dry.  Neurological:     Mental Status: She is alert and oriented to person, place, and time.  Psychiatric:        Behavior: Behavior normal.      UC Treatments / Results  Labs (all labs ordered are listed, but only abnormal results are displayed) Labs Reviewed  POCT URINALYSIS DIP (MANUAL ENTRY) - Abnormal; Notable for the following components:      Result Value   Leukocytes, UA Trace (*)    All other components within normal limits  COMPLETE METABOLIC PANEL WITH GFR  POCT CBC W AUTO DIFF ([facility])  POCT INFLUENZA A/B    EKG   Radiology DG Chest 2 View  Result Date: 09/12/2019 CLINICAL DATA:  One-week history  of intermittent fevers. Patient diagnosed with urinary tract infection at her primary provider and is now on day 3 of cephalexin antibiotic therapy. EXAM: CHEST - 2 VIEW COMPARISON:  04/25/2018 and earlier, including CTA chest 11/09/2016. FINDINGS: Cardiac silhouette normal in size, unchanged. Thoracic aorta mildly atherosclerotic, unchanged. Prominent RIGHT paracardiac fat pad as noted on the prior CT. Lungs clear. Bronchovascular markings normal. Pulmonary vascularity normal. No visible pleural effusions. No pneumothorax. Degenerative changes involving the thoracic and upper lumbar spine. No significant interval change. IMPRESSION: No acute cardiopulmonary disease. Stable examination. Electronically Signed   By: Evangeline Dakin M.D.   On: 09/12/2019 14:33    Procedures Procedures (including critical Fry time)  Medications Ordered in UC Medications - No data to display  Initial Impression / Assessment and Plan / UC Course  I have reviewed the triage vital signs and the nursing notes.  Pertinent labs & imaging results that were available during my Fry of  the patient were reviewed by me and considered in my medical decision making (see chart for details).     UA: trace leukocytes still present Reviewed culture from earlier in the week, Keflex still appropriate  Will have pt continue on Keflex Close f/u with PCP, encouraged to call on Monday Discussed symptoms that warrant emergent Fry in the ED. AVS provided.  Final Clinical Impressions(s) / UC Diagnoses   Final diagnoses:  Fever in adult  Flu-like symptoms     Discharge Instructions      You may continue to take Tylenol as needed for fever and pain. Be sure to keep taking the cephalexin prescribed for your UTI Call to schedule a follow up appointment with Primary Fry early this week for recheck of symptoms.  Be sure to stay well hydrated and get at least 8 hours of sleep at night. Preferably more when sick.  Call 911 or  have someone drive you to the hospital if symptoms worsening- dizziness, passing out, severe headache, chest pain, trouble breathing, unable to keep down fluids, or other new concerning symptoms develop.     ED Prescriptions    None     PDMP not reviewed this encounter.   Noe Gens, [place] 09/12/19 1655

## 2019-09-12 NOTE — ED Triage Notes (Signed)
Pt c/o intermittent fevers x 1 week. 101.3 Tues night. Saw Dr Zigmund Daniel, pos for UTI. Rx'd cephalexin 500mg . Has taken 3 days worth of antibiotics.Tylenol last night. Neg covid test Sunday.

## 2019-09-15 ENCOUNTER — Telehealth: Payer: Self-pay | Admitting: Family Medicine

## 2019-09-15 DIAGNOSIS — F902 Attention-deficit hyperactivity disorder, combined type: Secondary | ICD-10-CM | POA: Diagnosis not present

## 2019-09-15 DIAGNOSIS — F422 Mixed obsessional thoughts and acts: Secondary | ICD-10-CM | POA: Diagnosis not present

## 2019-09-15 DIAGNOSIS — F410 Panic disorder [episodic paroxysmal anxiety] without agoraphobia: Secondary | ICD-10-CM | POA: Diagnosis not present

## 2019-09-15 NOTE — Telephone Encounter (Signed)
Patient states that she has been running fevers a little over 100. States that she wants to get her urine re-checked. Only wants to see PCP. Nothing open for a 40 min visit. Please advise. States she is in a lot of pain and was recently seen in our UC.

## 2019-09-15 NOTE — Telephone Encounter (Signed)
Ok to double book tomorrow

## 2019-09-16 ENCOUNTER — Ambulatory Visit (INDEPENDENT_AMBULATORY_CARE_PROVIDER_SITE_OTHER): Payer: Medicare Other | Admitting: Family Medicine

## 2019-09-16 ENCOUNTER — Other Ambulatory Visit: Payer: Self-pay

## 2019-09-16 ENCOUNTER — Encounter: Payer: Self-pay | Admitting: Family Medicine

## 2019-09-16 VITALS — BP 110/56 | HR 87 | Temp 98.2°F | Ht 62.0 in | Wt 182.0 lb

## 2019-09-16 DIAGNOSIS — R1031 Right lower quadrant pain: Secondary | ICD-10-CM

## 2019-09-16 DIAGNOSIS — D649 Anemia, unspecified: Secondary | ICD-10-CM | POA: Diagnosis not present

## 2019-09-16 DIAGNOSIS — B9620 Unspecified Escherichia coli [E. coli] as the cause of diseases classified elsewhere: Secondary | ICD-10-CM

## 2019-09-16 DIAGNOSIS — R509 Fever, unspecified: Secondary | ICD-10-CM | POA: Diagnosis not present

## 2019-09-16 DIAGNOSIS — E669 Obesity, unspecified: Secondary | ICD-10-CM | POA: Diagnosis not present

## 2019-09-16 DIAGNOSIS — M791 Myalgia, unspecified site: Secondary | ICD-10-CM

## 2019-09-16 DIAGNOSIS — N390 Urinary tract infection, site not specified: Secondary | ICD-10-CM | POA: Diagnosis not present

## 2019-09-16 DIAGNOSIS — Z79899 Other long term (current) drug therapy: Secondary | ICD-10-CM | POA: Diagnosis not present

## 2019-09-16 NOTE — Progress Notes (Addendum)
Established Patient Office Visit  Subjective:  Patient ID: [name], female    DOB: February 07, 1946  Age: 74 y.o. MRN: [id number]  CC:  Chief Complaint  Patient presents with  . Fever    HPI [name] presents for running low grade temp and sweats x 2 weeks.  Seen by Dr. Zigmund Daniel and dx with nEcoli UTI and treated with keflex. Seen again on 09/12/2019 for fever and bodyaches and had a CXR and repeat UA.  Max temp 101.3. Has a chronic cough but  Has reflux. Tested neg [facility] at [facility].    Repeat UA was on 7/31 was neg for blood or urine.  WBC was 11.6 at that time and she also had a normal chest x-ray.  She says the urinary frequency is actually better and the nocturia is improved.  Normally she gets up once or twice a night she still been getting up about 3 times but it is definitely better.  But she still feels like she is running low-grade temperatures and just does not feel good she complains particularly of some achiness in her lower extremities.  And she says for about the last 4 to 5 days she is actually developed new onset right lower quadrant pain she says is more painful if she coughs or sneezes but is not usually bothersome with walking.   She denies any change in bowels with diarrhea constipation.  She says she always has chronic shortness of breath but is not worse than usual.  She always has a chronic cough.  No rash.    Past Medical History:  Diagnosis Date  . Allergic rhinitis, cause unspecified   . Anxiety   . Benign paroxysmal positional vertigo   . Cancer ([place])    basal cell carcinoma per right side of nostril  . CHF (congestive heart failure) ([place]) 11/09/2016   had 2+ edema in legs with anxiety and SOB  . Complication of anesthesia    has 4  degenerative discs in neck.  . Depressive disorder, not elsewhere classified   . Esophageal reflux   . History of hiatal hernia   . Hypertension   . Localized osteoarthrosis not specified whether primary or secondary,  lower leg   . Other and unspecified hyperlipidemia   . Other diseases of vocal cords   . Peptic ulcer, unspecified site, unspecified as acute or chronic, without mention of hemorrhage, perforation, or obstruction   . Pneumonia    bilat pneumonia 1987  . Pre-diabetes    no meds,just diet controll  . TMJ (temporomandibular joint disorder)   . Unspecified arthropathy, hand   . Unspecified asthma(493.90)    triggered with a virus   . Unspecified hypothyroidism     Past Surgical History:  Procedure Laterality Date  . BREAST BIOPSY  09/07/2011   High Risk Lesion  . BREAST EXCISIONAL BIOPSY Left   . BREAST SURGERY     LUMPECTOMY / LEFT 10/12/2011  . KNEE ARTHROSCOPY     bilat   . NASAL SINUS SURGERY    . ROTATOR CUFF REPAIR     right   . thumb surgery      left hand 45 years ago   . TOTAL HIP ARTHROPLASTY Right 08/10/2014   Procedure: RIGHT TOTAL HIP ARTHROPLASTY ANTERIOR APPROACH;  Surgeon: Paralee Cancel, MD;  Location: [facility];  Service: Orthopedics;  Laterality: Right;  . TOTAL KNEE ARTHROPLASTY Left 08/26/2018   Procedure: TOTAL KNEE ARTHROPLASTY;  Surgeon: Paralee Cancel, MD;  Location: [facility];  Service: Orthopedics;  Laterality: Left;  70 mins    Family History  Problem Relation Age of Onset  . Heart disease Father   . Colon cancer Mother   . Liver cancer Brother   . Asthma Brother     Social History   Socioeconomic History  . Marital status: Married    Spouse name: [name]  . Number of children: 3  . Years of education: 16  . Highest education level: Bachelor's degree (e.g., BA, AB, BS)  Occupational History  . Occupation: Retired    Comment: Pharmacist, hospital  Tobacco Use  . Smoking status: Former Smoker    Packs/day: 0.30    Years: 2.00    Pack years: 0.60    Types: Cigarettes    Quit date: 02/12/1958    Years since quitting: 61.6  . Smokeless tobacco: Never Used  Vaping Use  . Vaping Use: Never used  Substance and Sexual Activity  . Alcohol use: Yes    Alcohol/week:  1.0 standard drink    Types: 1 Glasses of wine per week    Comment: wine socially  . Drug use: No  . Sexual activity: Yes  Other Topics Concern  . Not on file  Social History Narrative   Helps watch grand babys   Social Determinants of Health   Financial Resource Strain:   . Difficulty of Paying Living Expenses:   Food Insecurity:   . Worried About Charity fundraiser in the Last Year:   . Arboriculturist in the Last Year:   Transportation Needs:   . Film/video editor (Medical):   Marland Kitchen Lack of Transportation (Non-Medical):   Physical Activity:   . Days of Exercise per Week:   . Minutes of Exercise per Session:   Stress:   . Feeling of Stress :   Social Connections:   . Frequency of Communication with Friends and Family:   . Frequency of Social Gatherings with Friends and Family:   . Attends Religious Services:   . Active Member of Clubs or Organizations:   . Attends Archivist Meetings:   Marland Kitchen Marital Status:   Intimate Partner Violence:   . Fear of Current or Ex-Partner:   . Emotionally Abused:   Marland Kitchen Physically Abused:   . Sexually Abused:     Outpatient Medications Prior to Visit  Medication Sig Dispense Refill  . albuterol (PROVENTIL HFA) 108 (90 Base) MCG/ACT inhaler Inhale 2 puffs into the lungs every 6 (six) hours as needed for shortness of breath. 6.7 g 5  . Ascorbic Acid (VITAMIN C WITH ROSE HIPS) 1000 MG tablet Take 1,000 mg by mouth daily.    . budesonide-formoterol (SYMBICORT) 80-4.5 MCG/ACT inhaler Inhale 2 puffs into the lungs 2 (two) times daily. 30.6 Inhaler 3  . buPROPion (WELLBUTRIN XL) 150 MG 24 hr tablet Take 150 mg by mouth daily.     . Calcium Carb-Cholecalciferol (CALCIUM 600+D3 PO) Take 1 tablet by mouth 2 (two) times a day.    . Carboxymethylcell-Hypromellose (GENTEAL OP) Place 1-2 drops into both eyes 3 (three) times daily as needed (for dry eyes).    . Cholecalciferol (VITAMIN D3) 50 MCG (2000 UT) capsule Take 200 Units by mouth daily.      . clonazePAM (KLONOPIN) 0.5 MG tablet Take 0.25 mg by mouth 2 (two) times daily as needed for anxiety.    . famotidine (PEPCID) 20 MG tablet Take 20 mg by mouth at bedtime.     Marland Kitchen  fexofenadine (ALLEGRA) 180 MG tablet Take 180 mg by mouth at bedtime.    . folic acid (FOLVITE) 1 MG tablet Take 1 mg by mouth 2 (two) times daily.      . Ginkgo Biloba 80 MG CAPS Take 400 mg by mouth daily.     . hydrochlorothiazide (HYDRODIURIL) 25 MG tablet TAKE 1 TABLET BY MOUTH EVERY DAY 90 tablet 2  . L-Lysine HCl 500 MG TABS Take 500 mg by mouth daily.     Marland Kitchen levothyroxine (SYNTHROID, LEVOTHROID) 75 MCG tablet Take 75 mcg by mouth See admin instructions. Take 75 mcg by mouth daily on all days except Sunday    . levothyroxine (SYNTHROID, LEVOTHROID) 88 MCG tablet Take 88 mcg by mouth every Sunday.     . losartan (COZAAR) 50 MG tablet Take 1 tablet (50 mg total) by mouth daily. 90 tablet 3  . Magnesium 100 MG TABS Take 150 mg by mouth 2 (two) times a day.    . meloxicam (MOBIC) 15 MG tablet TAKE 1 TABLET (15 MG TOTAL) BY MOUTH EVERY MORNING. 90 tablet 1  . metroNIDAZOLE (METROGEL) 1 % gel Apply topically daily. (Patient taking differently: Apply 1 application topically as needed. ) 60 g 1  . minoxidil (ROGAINE) 2 % external solution Apply topically 2 (two) times daily.    Marland Kitchen OVER THE COUNTER MEDICATION Take 1 tablet by mouth daily. Super Joint Support Supplement    . pantoprazole (PROTONIX) 40 MG tablet TAKE 1 TABLET BY MOUTH EVERY DAY 90 tablet 3  . PARoxetine (PAXIL-CR) 12.5 MG 24 hr tablet Take 25 mg by mouth 2 (two) times daily.     . cephALEXin (KEFLEX) 500 MG capsule Take 1 capsule (500 mg total) by mouth 2 (two) times daily for 7 days. 14 capsule 0   No facility-administered medications prior to visit.    Allergies  Allergen Reactions  . Astelin [Azelastine Hcl] Other (See Comments)    Headache  . Celebrex [Celecoxib] Other (See Comments)    Stomach issues  . Lisinopril Other (See Comments) and Cough     ACE cough  . Methotrexate Derivatives Other (See Comments)  . Morphine Nausea Only  . Moxifloxacin Other (See Comments)    REACTION: confusion, dizziness, paranoia    ROS Review of Systems    Objective:    Physical Exam Constitutional:      Appearance: Normal appearance. She is well-developed.  HENT:     Head: Normocephalic and atraumatic.     Right Ear: Tympanic membrane, ear canal and external ear normal.     Left Ear: Tympanic membrane, ear canal and external ear normal.     Nose: Nose normal.     Mouth/Throat:     Mouth: Mucous membranes are moist.  Eyes:     Conjunctiva/sclera: Conjunctivae normal.     Pupils: Pupils are equal, round, and reactive to light.  Neck:     Thyroid: No thyromegaly.  Cardiovascular:     Rate and Rhythm: Normal rate and regular rhythm.     Heart sounds: Normal heart sounds.  Pulmonary:     Effort: Pulmonary effort is normal.     Breath sounds: Normal breath sounds. No wheezing.  Abdominal:     General: Abdomen is flat. Bowel sounds are normal. There is no distension.     Palpations: Abdomen is soft. There is no mass.     Tenderness: There is abdominal tenderness.     Comments: Is actually tender in the right upper  quadrant in the right lower quadrant.  No hepatosplenomegaly.  Musculoskeletal:        General: No swelling.     Cervical back: Neck supple.  Lymphadenopathy:     Cervical: No cervical adenopathy.  Skin:    General: Skin is warm and dry.  Neurological:     Mental Status: She is alert and oriented to person, place, and time.  Psychiatric:        Mood and Affect: Mood normal.        Behavior: Behavior normal.     BP (!) 110/56   Pulse 87   Temp 98.2 F (36.8 C)   Ht 5\' 2"  (1.575 m)   Wt 182 lb (82.6 kg)   SpO2 95%   BMI 33.29 kg/m  Wt Readings from Last 3 Encounters:  09/16/19 182 lb (82.6 kg)  09/09/19 183 lb (83 kg)  05/25/19 186 lb (84.4 kg)     Health Maintenance Due  Topic Date Due  . INFLUENZA  VACCINE  09/13/2019    There are no preventive care reminders to display for this patient.  Lab Results  Component Value Date   TSH 0.48 08/07/2018   Lab Results  Component Value Date   WBC 9.2 09/16/2019   HGB 11.6 (L) 09/16/2019   HCT 34.9 (L) 09/16/2019   MCV 84.3 09/16/2019   PLT 406 (H) 09/16/2019   Lab Results  Component Value Date   NA 138 09/12/2019   K 4.2 09/12/2019   CO2 28 09/12/2019   GLUCOSE 90 09/12/2019   BUN 17 09/12/2019   CREATININE 0.72 09/12/2019   BILITOT 0.4 09/12/2019   ALKPHOS 57 08/07/2018   AST 14 09/12/2019   ALT 25 09/12/2019   PROT 6.4 09/12/2019   ALBUMIN 4.0 01/16/2016   CALCIUM 9.4 09/12/2019   ANIONGAP 9 08/27/2018   Lab Results  Component Value Date   CHOL 195 08/07/2018   Lab Results  Component Value Date   HDL 59 08/07/2018   Lab Results  Component Value Date   LDLCALC 126 08/07/2018   Lab Results  Component Value Date   TRIG 110 08/07/2018   Lab Results  Component Value Date   CHOLHDL 3.6 CALC 09/18/2006   Lab Results  Component Value Date   HGBA1C 5.9 07/02/2019      Assessment & Plan:   Problem List Items Addressed This Visit    None    Visit Diagnoses    Fever, unspecified fever cause    -  Primary   Relevant Orders   COMPLETE METABOLIC PANEL WITH GFR   CBC with Differential/Platelet (Completed)   Sedimentation rate (Completed)   Urine Culture   CK (Creatine Kinase)   CT Abdomen Pelvis W Contrast   E. coli UTI       Relevant Orders   COMPLETE METABOLIC PANEL WITH GFR   CBC with Differential/Platelet (Completed)   Sedimentation rate (Completed)   Urine Culture   CK (Creatine Kinase)   CT Abdomen Pelvis W Contrast   Myalgia       Relevant Orders   COMPLETE METABOLIC PANEL WITH GFR   CBC with Differential/Platelet (Completed)   Sedimentation rate (Completed)   Urine Culture   CK (Creatine Kinase)   CT Abdomen Pelvis W Contrast   Right lower quadrant abdominal pain       Relevant Orders    CT Abdomen Pelvis W Contrast   RLQ abdominal pain       Relevant Orders  CT Abdomen Pelvis W Contrast     Fever with myalgia and new onset right lower quadrant pain-she clearly had an E. coli UTI and I do think her symptoms are improving from that but I am concerned that she still having fevers and not feeling well.  She does have a history of rheumatoid arthritis will check sed rate.  Also with new onset right lower quadrant pain will also check CT abdomen and pelvis with contrast for further work-up.  She does still have an appendix.  Also repeat urine culture just to make sure that we have completely cleared up the E. coli UTI even though her repeat UA a couple days ago did look better.  Also check a CK for the increased muscle aches.  Will call with results once available in the meantime continue symptomatic treatment and call if suddenly worse or new symptoms develop.  No orders of the defined types were placed in this encounter.   Follow-up: No follow-ups on file.    Beatrice Lecher, MD

## 2019-09-16 NOTE — Telephone Encounter (Signed)
Appointment has been made. No further questions at this time.  

## 2019-09-18 ENCOUNTER — Other Ambulatory Visit: Payer: Self-pay

## 2019-09-18 ENCOUNTER — Ambulatory Visit (HOSPITAL_BASED_OUTPATIENT_CLINIC_OR_DEPARTMENT_OTHER)
Admission: RE | Admit: 2019-09-18 | Discharge: 2019-09-18 | Disposition: A | Discharge status: Home / Self Care | Payer: Medicare Other | Source: Ambulatory Visit | Attending: Family Medicine | Admitting: Family Medicine

## 2019-09-18 DIAGNOSIS — R509 Fever, unspecified: Secondary | ICD-10-CM | POA: Insufficient documentation

## 2019-09-18 DIAGNOSIS — R1031 Right lower quadrant pain: Secondary | ICD-10-CM | POA: Diagnosis not present

## 2019-09-18 DIAGNOSIS — N390 Urinary tract infection, site not specified: Secondary | ICD-10-CM | POA: Diagnosis not present

## 2019-09-18 DIAGNOSIS — M791 Myalgia, unspecified site: Secondary | ICD-10-CM | POA: Insufficient documentation

## 2019-09-18 DIAGNOSIS — B9620 Unspecified Escherichia coli [E. coli] as the cause of diseases classified elsewhere: Secondary | ICD-10-CM | POA: Insufficient documentation

## 2019-09-18 DIAGNOSIS — N200 Calculus of kidney: Secondary | ICD-10-CM | POA: Diagnosis not present

## 2019-09-18 MED ORDER — IOHEXOL 300 MG/ML  SOLN
100.0000 mL | Freq: Once | INTRAMUSCULAR | Status: AC | PRN
  Administered 2019-09-18: 100 mL via INTRAVENOUS

## 2019-09-19 LAB — CBC WITH DIFFERENTIAL/PLATELET
Absolute Monocytes: 837 cells/uL (ref 200–950)
Basophils Absolute: 74 cells/uL (ref 0–200)
Basophils Relative: 0.8 %
Eosinophils Absolute: 322 cells/uL (ref 15–500)
Eosinophils Relative: 3.5 %
HCT: 34.9 % — ABNORMAL LOW (ref 35.0–45.0)
Hemoglobin: 11.6 g/dL — ABNORMAL LOW (ref 11.7–15.5)
Lymphs Abs: 1454 cells/uL (ref 850–3900)
MCH: 28 pg (ref 27.0–33.0)
MCHC: 33.2 g/dL (ref 32.0–36.0)
MCV: 84.3 fL (ref 80.0–100.0)
MPV: 10.7 fL (ref 7.5–12.5)
Monocytes Relative: 9.1 %
Neutro Abs: 6514 cells/uL (ref 1500–7800)
Neutrophils Relative %: 70.8 %
Platelets: 406 10*3/uL — ABNORMAL HIGH (ref 140–400)
RBC: 4.14 10*6/uL (ref 3.80–5.10)
RDW: 12.6 % (ref 11.0–15.0)
Total Lymphocyte: 15.8 %
WBC: 9.2 10*3/uL (ref 3.8–10.8)

## 2019-09-19 LAB — COMPLETE METABOLIC PANEL WITH GFR
AG Ratio: 1.4 (calc) (ref 1.0–2.5)
ALT: 19 U/L (ref 6–29)
AST: 12 U/L (ref 10–35)
Albumin: 3.6 g/dL (ref 3.6–5.1)
Alkaline phosphatase (APISO): 59 U/L (ref 37–153)
BUN: 17 mg/dL (ref 7–25)
CO2: 31 mmol/L (ref 20–32)
Calcium: 9.1 mg/dL (ref 8.6–10.4)
Chloride: 103 mmol/L (ref 98–110)
Creat: 0.73 mg/dL (ref 0.60–0.93)
GFR, Est African American: 93 mL/min/{1.73_m2} (ref >=60)
GFR, Est Non African American: 81 mL/min/{1.73_m2} (ref >=60)
Globulin: 2.6 g/dL (calc) (ref 1.9–3.7)
Glucose, Bld: 112 mg/dL (ref 65–139)
Potassium: 4.1 mmol/L (ref 3.5–5.3)
Sodium: 138 mmol/L (ref 135–146)
Total Bilirubin: 0.4 mg/dL (ref 0.2–1.2)
Total Protein: 6.2 g/dL (ref 6.1–8.1)

## 2019-09-19 LAB — URINE CULTURE
MICRO NUMBER:: 10787693
Result:: NO GROWTH
SPECIMEN QUALITY:: ADEQUATE

## 2019-09-19 LAB — IRON: Iron: 30 ug/dL — ABNORMAL LOW (ref 45–160)

## 2019-09-19 LAB — CK: Total CK: 37 U/L (ref 29–143)

## 2019-09-19 LAB — SEDIMENTATION RATE: Sed Rate: 65 mm/h — ABNORMAL HIGH (ref 0–30)

## 2019-09-19 LAB — VITAMIN B12: Vitamin B-12: 666 pg/mL (ref 200–1100)

## 2019-09-21 ENCOUNTER — Encounter: Payer: Self-pay | Admitting: Family Medicine

## 2019-09-21 ENCOUNTER — Telehealth: Payer: Self-pay

## 2019-09-21 DIAGNOSIS — E611 Iron deficiency: Secondary | ICD-10-CM

## 2019-09-21 DIAGNOSIS — N200 Calculus of kidney: Secondary | ICD-10-CM | POA: Insufficient documentation

## 2019-09-21 NOTE — Telephone Encounter (Signed)
Spoke w/pt regarding her CT and lab results. Questions answered and she had no other questions/concerns at this time.

## 2019-09-21 NOTE — Telephone Encounter (Signed)
Pt called stating she reviewed her CT scan results via MyChart. She has a few questions to ask. Pt is requesting a call back. Thanks.

## 2019-10-07 ENCOUNTER — Other Ambulatory Visit: Payer: Self-pay | Admitting: Family Medicine

## 2019-10-21 ENCOUNTER — Encounter: Payer: Self-pay | Admitting: Family Medicine

## 2019-10-22 ENCOUNTER — Encounter: Payer: Self-pay | Admitting: Family Medicine

## 2019-10-22 ENCOUNTER — Telehealth (INDEPENDENT_AMBULATORY_CARE_PROVIDER_SITE_OTHER): Payer: Medicare Other | Admitting: Family Medicine

## 2019-10-22 DIAGNOSIS — R238 Other skin changes: Secondary | ICD-10-CM | POA: Diagnosis not present

## 2019-10-22 MED ORDER — MUPIROCIN 2 % EX OINT: TOPICAL_OINTMENT | Freq: Two times a day (BID) | CUTANEOUS | 0 refills | Status: DC

## 2019-10-22 MED ORDER — HIBICLENS 4 % EX LIQD: CUTANEOUS | 0 refills | Status: DC

## 2019-10-22 MED ORDER — TRIAMCINOLONE ACETONIDE 0.1 % EX CREA
1.0000 | TOPICAL_CREAM | Freq: Two times a day (BID) | CUTANEOUS | 0 refills | Status: DC
Start: 2019-10-22 — End: 2020-02-02

## 2019-10-22 NOTE — Progress Notes (Signed)
Pt states that she has multiple mosquito bites on her lower legs. She said that she was in the pool on Sunday and didn't notice or feel any biting but the following day her legs were itchy and she noticed pus filled bumps. She has been using calamine lotion for them but the areas are still itchy. She denies f/s/c/n/v/headaches.

## 2019-10-22 NOTE — Progress Notes (Signed)
Virtual Visit via Telephone Note  I connected with [name] on 10/22/19 at  1:20 PM EDT by telephone and verified that I am speaking with the correct person using two identifiers.   I discussed the limitations, risks, security and privacy concerns of performing an evaluation and management service by telephone and the availability of in person appointments. I also discussed with the patient that there may be a patient responsible charge related to this service. The patient expressed understanding and agreed to proceed.  Patient location: at home.   Provider loccation: In office   Subjective:    CC: Bites  HPI:  Pt states that she has multiple mosquito bites on her lower legs. Noticed them on Monday. Had been at the pool on Sunday and didn't notice or feel any biting but the following day her legs were itchy and she noticed pus filled bumps. She has been using calamine lotion for them but the areas are still itchy. She denies f/s/c/n/v/headaches. The areas are extremely itchy. She says she had 2 new bumps pop up today on her arm.  Some of the blisters have ruptured.    She has never had this before.     Past medical history, Surgical history, Family history not pertinant except as noted below, Social history, Allergies, and medications have been entered into the medical record, reviewed, and corrections made.   Review of Systems: No fevers, chills, night sweats, weight loss, chest pain, or shortness of breath.   Objective:    General: Speaking clearly in complete sentences without any shortness of breath.  Alert and oriented x3.  Normal judgment. No apparent acute distress.    Impression and Recommendations:    Bullae - not sure these are insect bites. This seems really unusual.  Will treat itcing with topical steroid. The ones that have broken open, use the mupricin ointment. I am goind to have her come in tomorrow to look at the bullae. Will culture. Clean areas with  HIbiclens.   Meds ordered this encounter  Medications  . triamcinolone cream (KENALOG) 0.1 %    Sig: Apply 1 application topically 2 (two) times daily. To the closed itchy bumps on the skin    Dispense:  30 g    Refill:  0  . mupirocin ointment (BACTROBAN) 2 %    Sig: Apply topically 2 (two) times daily. To open sores on skin    Dispense:  22 g    Refill:  0  . chlorhexidine (HIBICLENS) 4 % external liquid    Sig: cleanse wounds daily and pat dry.    Dispense:  120 mL    Refill:  0      I discussed the assessment and treatment plan with the patient. The patient was provided an opportunity to ask questions and all were answered. The patient agreed with the plan and demonstrated an understanding of the instructions.   The patient was advised to call back or seek an in-person evaluation if the symptoms worsen or if the condition fails to improve as anticipated.  I provided 21 minutes of non-face-to-face time during this encounter.   Beatrice Lecher, MD

## 2019-10-23 ENCOUNTER — Ambulatory Visit (INDEPENDENT_AMBULATORY_CARE_PROVIDER_SITE_OTHER): Payer: Medicare Other | Admitting: Family Medicine

## 2019-10-23 ENCOUNTER — Other Ambulatory Visit: Payer: Self-pay

## 2019-10-23 ENCOUNTER — Other Ambulatory Visit: Payer: Self-pay | Admitting: Family Medicine

## 2019-10-23 DIAGNOSIS — L120 Bullous pemphigoid: Secondary | ICD-10-CM | POA: Diagnosis not present

## 2019-10-23 DIAGNOSIS — R238 Other skin changes: Secondary | ICD-10-CM

## 2019-10-23 DIAGNOSIS — L139 Bullous disorder, unspecified: Secondary | ICD-10-CM | POA: Diagnosis not present

## 2019-10-23 NOTE — Progress Notes (Signed)
Acute Office Visit  Subjective:    Patient ID: [name], female    DOB: 01-11-1946, 74 y.o.   MRN: [id number]  Chief Complaint  Patient presents with  . Biopsy    R upper leg    HPI Patient is in today for please see previous note from yesterday.  Asked if patient could come in today so that we could actually look at the lesions she had sent some photographs through my chart though they were a little bit difficult to assess.  Concerned based on age for bullous pemphigus.  Past Medical History:  Diagnosis Date  . Allergic rhinitis, cause unspecified   . Anxiety   . Benign paroxysmal positional vertigo   . Cancer ([place])    basal cell carcinoma per right side of nostril  . CHF (congestive heart failure) ([place]) 11/09/2016   had 2+ edema in legs with anxiety and SOB  . Complication of anesthesia    has 4  degenerative discs in neck.  . Depressive disorder, not elsewhere classified   . Esophageal reflux   . History of hiatal hernia   . Hypertension   . Localized osteoarthrosis not specified whether primary or secondary, lower leg   . Other and unspecified hyperlipidemia   . Other diseases of vocal cords   . Peptic ulcer, unspecified site, unspecified as acute or chronic, without mention of hemorrhage, perforation, or obstruction   . Pneumonia    bilat pneumonia 1987  . Pre-diabetes    no meds,just diet controll  . TMJ (temporomandibular joint disorder)   . Unspecified arthropathy, hand   . Unspecified asthma(493.90)    triggered with a virus   . Unspecified hypothyroidism     Past Surgical History:  Procedure Laterality Date  . BREAST BIOPSY  09/07/2011   High Risk Lesion  . BREAST EXCISIONAL BIOPSY Left   . BREAST SURGERY     LUMPECTOMY / LEFT 10/12/2011  . KNEE ARTHROSCOPY     bilat   . NASAL SINUS SURGERY    . ROTATOR CUFF REPAIR     right   . thumb surgery      left hand 45 years ago   . TOTAL HIP ARTHROPLASTY Right 08/10/2014   Procedure: RIGHT TOTAL  HIP ARTHROPLASTY ANTERIOR APPROACH;  Surgeon: Paralee Cancel, MD;  Location: [facility];  Service: Orthopedics;  Laterality: Right;  . TOTAL KNEE ARTHROPLASTY Left 08/26/2018   Procedure: TOTAL KNEE ARTHROPLASTY;  Surgeon: Paralee Cancel, MD;  Location: [facility];  Service: Orthopedics;  Laterality: Left;  70 mins    Family History  Problem Relation Age of Onset  . Heart disease Father   . Colon cancer Mother   . Liver cancer Brother   . Asthma Brother     Social History   Socioeconomic History  . Marital status: Married    Spouse name: [name]  . Number of children: 3  . Years of education: 16  . Highest education level: Bachelor's degree (e.g., BA, AB, BS)  Occupational History  . Occupation: Retired    Comment: Pharmacist, hospital  Tobacco Use  . Smoking status: Former Smoker    Packs/day: 0.30    Years: 2.00    Pack years: 0.60    Types: Cigarettes    Quit date: 02/12/1958    Years since quitting: 61.7  . Smokeless tobacco: Never Used  Vaping Use  . Vaping Use: Never used  Substance and Sexual Activity  . Alcohol use: Yes    Alcohol/week: 1.0  standard drink    Types: 1 Glasses of wine per week    Comment: wine socially  . Drug use: No  . Sexual activity: Yes  Other Topics Concern  . Not on file  Social History Narrative   Helps watch grand babys   Social Determinants of Health   Financial Resource Strain:   . Difficulty of Paying Living Expenses: Not on file  Food Insecurity:   . Worried About Charity fundraiser in the Last Year: Not on file  . Ran Out of Food in the Last Year: Not on file  Transportation Needs:   . Lack of Transportation (Medical): Not on file  . Lack of Transportation (Non-Medical): Not on file  Physical Activity:   . Days of Exercise per Week: Not on file  . Minutes of Exercise per Session: Not on file  Stress:   . Feeling of Stress : Not on file  Social Connections:   . Frequency of Communication with Friends and Family: Not on file  . Frequency of  Social Gatherings with Friends and Family: Not on file  . Attends Religious Services: Not on file  . Active Member of Clubs or Organizations: Not on file  . Attends Archivist Meetings: Not on file  . Marital Status: Not on file  Intimate Partner Violence:   . Fear of Current or Ex-Partner: Not on file  . Emotionally Abused: Not on file  . Physically Abused: Not on file  . Sexually Abused: Not on file    Outpatient Medications Prior to Visit  Medication Sig Dispense Refill  . albuterol (PROVENTIL HFA) 108 (90 Base) MCG/ACT inhaler Inhale 2 puffs into the lungs every 6 (six) hours as needed for shortness of breath. 6.7 g 5  . Ascorbic Acid (VITAMIN C WITH ROSE HIPS) 1000 MG tablet Take 1,000 mg by mouth daily.    . budesonide-formoterol (SYMBICORT) 80-4.5 MCG/ACT inhaler Inhale 2 puffs into the lungs 2 (two) times daily. 30.6 Inhaler 3  . buPROPion (WELLBUTRIN XL) 150 MG 24 hr tablet Take 150 mg by mouth daily.     . Calcium Carb-Cholecalciferol (CALCIUM 600+D3 PO) Take 1 tablet by mouth 2 (two) times a day.    . Carboxymethylcell-Hypromellose (GENTEAL OP) Place 1-2 drops into both eyes 3 (three) times daily as needed (for dry eyes).    . chlorhexidine (HIBICLENS) 4 % external liquid cleanse wounds daily and pat dry. 120 mL 0  . Cholecalciferol (VITAMIN D3) 50 MCG (2000 UT) capsule Take 200 Units by mouth daily.     . clonazePAM (KLONOPIN) 0.5 MG tablet Take 0.25 mg by mouth 2 (two) times daily as needed for anxiety.    . famotidine (PEPCID) 20 MG tablet Take 20 mg by mouth at bedtime.     . fexofenadine (ALLEGRA) 180 MG tablet Take 180 mg by mouth at bedtime.    . folic acid (FOLVITE) 1 MG tablet Take 1 mg by mouth 2 (two) times daily.      . Ginkgo Biloba 80 MG CAPS Take 400 mg by mouth daily.     . hydrochlorothiazide (HYDRODIURIL) 25 MG tablet TAKE 1 TABLET BY MOUTH EVERY DAY 90 tablet 2  . L-Lysine HCl 500 MG TABS Take 500 mg by mouth daily.     Marland Kitchen levothyroxine (SYNTHROID,  LEVOTHROID) 75 MCG tablet Take 75 mcg by mouth See admin instructions. Take 75 mcg by mouth daily on all days except Sunday    . levothyroxine (SYNTHROID, LEVOTHROID) 88  MCG tablet Take 88 mcg by mouth every Sunday.     . losartan (COZAAR) 50 MG tablet Take 1 tablet (50 mg total) by mouth daily. 90 tablet 3  . Magnesium 100 MG TABS Take 150 mg by mouth 2 (two) times a day.    . meloxicam (MOBIC) 15 MG tablet TAKE 1 TABLET (15 MG TOTAL) BY MOUTH EVERY MORNING. 90 tablet 1  . metroNIDAZOLE (METROGEL) 1 % gel Apply topically daily. (Patient taking differently: Apply 1 application topically as needed. ) 60 g 1  . minoxidil (ROGAINE) 2 % external solution Apply topically 2 (two) times daily.    . mupirocin ointment (BACTROBAN) 2 % Apply topically 2 (two) times daily. To open sores on skin 22 g 0  . OVER THE COUNTER MEDICATION Take 1 tablet by mouth daily. Super Joint Support Supplement    . pantoprazole (PROTONIX) 40 MG tablet TAKE 1 TABLET BY MOUTH EVERY DAY 90 tablet 3  . PARoxetine (PAXIL-CR) 12.5 MG 24 hr tablet Take 25 mg by mouth 2 (two) times daily.     Marland Kitchen triamcinolone cream (KENALOG) 0.1 % Apply 1 application topically 2 (two) times daily. To the closed itchy bumps on the skin 30 g 0   No facility-administered medications prior to visit.    Allergies  Allergen Reactions  . Astelin [Azelastine Hcl] Other (See Comments)    Headache  . Celebrex [Celecoxib] Other (See Comments)    Stomach issues  . Lisinopril Other (See Comments) and Cough    ACE cough  . Methotrexate Derivatives Other (See Comments)  . Morphine Nausea Only  . Moxifloxacin Other (See Comments)    REACTION: confusion, dizziness, paranoia    Review of Systems     Objective:    Physical Exam Vitals reviewed.  Constitutional:      Appearance: She is well-developed.  HENT:     Head: Normocephalic and atraumatic.  Eyes:     Conjunctiva/sclera: Conjunctivae normal.  Cardiovascular:     Rate and Rhythm: Normal  rate.  Pulmonary:     Effort: Pulmonary effort is normal.  Skin:    General: Skin is dry.     Coloration: Skin is not pale.     Comments: He has an approximately 1 cm yellow-colored bulla on the left inner thigh.  She has a couple scattered lesions on her arms some of which look like they are drying up in a few that are more papular but less than 1 cm in size.  Neurological:     Mental Status: She is alert and oriented to person, place, and time.  Psychiatric:        Behavior: Behavior normal.     There were no vitals taken for this visit. Wt Readings from Last 3 Encounters:  09/16/19 182 lb (82.6 kg)  09/09/19 183 lb (83 kg)  05/25/19 186 lb (84.4 kg)    There are no preventive care reminders to display for this patient.  There are no preventive care reminders to display for this patient.   Lab Results  Component Value Date   TSH 0.48 08/07/2018   Lab Results  Component Value Date   WBC 9.2 09/16/2019   HGB 11.6 (L) 09/16/2019   HCT 34.9 (L) 09/16/2019   MCV 84.3 09/16/2019   PLT 406 (H) 09/16/2019   Lab Results  Component Value Date   NA 138 09/16/2019   K 4.1 09/16/2019   CO2 31 09/16/2019   GLUCOSE 112 09/16/2019   BUN  17 09/16/2019   CREATININE 0.73 09/16/2019   BILITOT 0.4 09/16/2019   ALKPHOS 57 08/07/2018   AST 12 09/16/2019   ALT 19 09/16/2019   PROT 6.2 09/16/2019   ALBUMIN 4.0 01/16/2016   CALCIUM 9.1 09/16/2019   ANIONGAP 9 08/27/2018   Lab Results  Component Value Date   CHOL 195 08/07/2018   Lab Results  Component Value Date   HDL 59 08/07/2018   Lab Results  Component Value Date   LDLCALC 126 08/07/2018   Lab Results  Component Value Date   TRIG 110 08/07/2018   Lab Results  Component Value Date   CHOLHDL 3.6 CALC 09/18/2006   Lab Results  Component Value Date   HGBA1C 5.9 07/02/2019       Assessment & Plan:   Problem List Items Addressed This Visit    None    Visit Diagnoses    Bullae    -  Primary   Bullous skin  disease       Relevant Orders   Surgical pathology   Surgical pathology     Punch Biopsy Procedure Note  Pre-operative Diagnosis: Suspicious lesion  Post-operative Diagnosis: same  Locations:left inner thigh  Indications: very itchy bullae  Anesthesia: not required    Procedure Details   Patient informed of the risks (including bleeding and infection) and benefits of the  procedure and Verbal informed consent obtained.  The lesion and surrounding area was given a sterile prep using chlorhexidine and draped in the usual sterile fashion. The skin was then stretched perpendicular to the skin tension lines and the lesion removed using the 36mm punch. The punch was not closed. Antibiotic ointment and a sterile dressing applied. The specimen was sent for pathologic examination. The patient tolerated the procedure well.  EBL: trace  Findings: await pathology  Condition: Stable  Complications: none.  Plan: 1. Instructed to keep the wound dry and covered for 24-48h and clean thereafter. 2. Warning signs of infection were reviewed.   3. Recommended that the patient use OTC acetaminophen as needed for pain.  4. Return for any sign of infection. F/U wound care discussed.    No orders of the defined types were placed in this encounter.    Beatrice Lecher, MD

## 2019-10-27 ENCOUNTER — Telehealth: Payer: Self-pay

## 2019-10-27 DIAGNOSIS — F410 Panic disorder [episodic paroxysmal anxiety] without agoraphobia: Secondary | ICD-10-CM | POA: Diagnosis not present

## 2019-10-27 DIAGNOSIS — F902 Attention-deficit hyperactivity disorder, combined type: Secondary | ICD-10-CM | POA: Diagnosis not present

## 2019-10-27 DIAGNOSIS — F422 Mixed obsessional thoughts and acts: Secondary | ICD-10-CM | POA: Diagnosis not present

## 2019-10-27 NOTE — Telephone Encounter (Signed)
Patient called stating that she is still using just band aide and regular Vaseline on the spot that was biopsied. She had had just one drop of blood on band aid daily until today, no blood. Wanting to know if she should continue with band aide and Vaseline or band aide with Mupirocin ointment.   She has been using Mupirocin on the spot on the front of her ankle and it has formed a "soft light colored scab". She is wanting to make sure it is OK for her to continue to use the ointment.   No hurting, itching, or redness around either of her spots.

## 2019-10-28 ENCOUNTER — Telehealth: Payer: Self-pay

## 2019-10-28 ENCOUNTER — Encounter: Payer: Self-pay | Admitting: Family Medicine

## 2019-10-28 NOTE — Telephone Encounter (Signed)
Received call from [place] with [facility] and she states that for the two specimens she received, both were in formalin. Requisition was asking for direct immunofluorescence to be done, but in order for that one specimen has to be placed in Michels fixative instead of formalin. Unable to do direct. Immuno.    [name]'s CB # [phone] [id number]

## 2019-10-28 NOTE — Telephone Encounter (Signed)
OK, thank you. I didn't know that

## 2019-10-28 NOTE — Telephone Encounter (Signed)
Please call patient and let her know that she no longer needs to cover the spot on her thigh with a Band-Aid especially if it is no longer draining just continue to apply the Vaseline twice a day for at least 2 additional weeks.  But it can certainly breathe.  In regards to the spot on her ankle okay to try switching to just plain Vaseline for that area as well but definitely save the tube of mupirocin in case we needed in the future.  Good if the scab has softened.

## 2019-10-28 NOTE — Telephone Encounter (Signed)
Patient advised of results and recommendations.  

## 2019-10-30 DIAGNOSIS — E039 Hypothyroidism, unspecified: Secondary | ICD-10-CM | POA: Diagnosis not present

## 2019-10-30 DIAGNOSIS — E7800 Pure hypercholesterolemia, unspecified: Secondary | ICD-10-CM | POA: Diagnosis not present

## 2019-10-30 DIAGNOSIS — R7301 Impaired fasting glucose: Secondary | ICD-10-CM | POA: Diagnosis not present

## 2019-10-30 LAB — BASIC METABOLIC PANEL
BUN: 19 (ref 4–21)
CO2: 27 — AB (ref 13–22)
Chloride: 103 (ref 99–108)
Creatinine: 0.7 (ref 0.5–1.1)
Glucose: 92
Potassium: 4.2 (ref 3.4–5.3)
Sodium: 138 (ref 137–147)

## 2019-10-30 LAB — LIPID PANEL
Cholesterol: 199 (ref 0–200)
HDL: 55 (ref 35–70)
LDL Cholesterol: 123
Triglycerides: 162 — AB (ref 40–160)

## 2019-10-30 LAB — HEPATIC FUNCTION PANEL
ALT: 38 — AB (ref 7–35)
AST: 19 (ref 13–35)
Bilirubin, Total: 0.4

## 2019-10-30 LAB — COMPREHENSIVE METABOLIC PANEL
Albumin: 3.9 (ref 3.5–5.0)
Calcium: 9.4 (ref 8.7–10.7)
GFR calc non Af Amer: 82

## 2019-10-30 LAB — TSH: TSH: 0.7 (ref 0.41–5.90)

## 2019-10-30 LAB — HEMOGLOBIN A1C: Hemoglobin A1C: 6.2

## 2019-11-02 ENCOUNTER — Telehealth: Payer: Self-pay | Admitting: Family Medicine

## 2019-11-02 NOTE — Telephone Encounter (Signed)
Message [phone] From  Jenene Slicker To  Gray Bernhardt Sent  11/02/2019 10:31 AM  "Dear Dr. Madilyn Fireman, I want to say first that I appreciate the medical attention you provided me when it came to the photos I sent and the follow-up visit with you .  Doing the biopsy was the first step in determining what was the root cause of the abrasions on my legs and arms.  When the lab results came back and identified that the cause was most likely  an autoimmune disease named Bulbous Pemphigoid,  I needed time to assimilate this information and do some research to try to understand how this happened .  I would like very much to talk to you regarding your recommendation to see a dermatologist .   I was wondering if you would consider recommending a holistic doctor that might use another approach than traditional medicine.  My need to know the causes of this disease and what actions I might take are most on my mind.  In the meantime, I have broken out with five new very itchy large bumps. I'm trying not to panic, but the ointment doesn't help the itch.    I look forward to seeing you in person.   Thank you,  [name]"

## 2019-11-02 NOTE — Telephone Encounter (Signed)
It sounds like she is wanting in person appointment.  Please schedule.

## 2019-11-03 ENCOUNTER — Ambulatory Visit: Payer: Medicare Other | Admitting: Family Medicine

## 2019-11-03 ENCOUNTER — Telehealth: Payer: Self-pay | Admitting: Family Medicine

## 2019-11-03 MED ORDER — CLOBETASOL PROPIONATE 0.05 % EX OINT: 1.0000 "application " | TOPICAL_OINTMENT | Freq: Two times a day (BID) | CUTANEOUS | 0 refills | Status: DC

## 2019-11-03 NOTE — Telephone Encounter (Signed)
There was a scheduling error.  Patient initially placed on the schedule today but was not contacted back to confirm.  We are able to get her in on Friday because unfortunately my schedule is already booked so we will go ahead and send in a stronger steroid cream for her to try over the next couple of days.

## 2019-11-05 DIAGNOSIS — E039 Hypothyroidism, unspecified: Secondary | ICD-10-CM | POA: Diagnosis not present

## 2019-11-05 DIAGNOSIS — E559 Vitamin D deficiency, unspecified: Secondary | ICD-10-CM | POA: Diagnosis not present

## 2019-11-05 DIAGNOSIS — L659 Nonscarring hair loss, unspecified: Secondary | ICD-10-CM | POA: Diagnosis not present

## 2019-11-05 DIAGNOSIS — R7301 Impaired fasting glucose: Secondary | ICD-10-CM | POA: Diagnosis not present

## 2019-11-05 DIAGNOSIS — I10 Essential (primary) hypertension: Secondary | ICD-10-CM | POA: Diagnosis not present

## 2019-11-05 DIAGNOSIS — R5382 Chronic fatigue, unspecified: Secondary | ICD-10-CM | POA: Diagnosis not present

## 2019-11-05 DIAGNOSIS — E7800 Pure hypercholesterolemia, unspecified: Secondary | ICD-10-CM | POA: Diagnosis not present

## 2019-11-06 ENCOUNTER — Encounter: Payer: Self-pay | Admitting: Family Medicine

## 2019-11-06 ENCOUNTER — Ambulatory Visit (INDEPENDENT_AMBULATORY_CARE_PROVIDER_SITE_OTHER): Payer: Medicare Other | Admitting: Family Medicine

## 2019-11-06 VITALS — BP 116/56 | HR 81 | Ht 62.0 in | Wt 184.0 lb

## 2019-11-06 DIAGNOSIS — L120 Bullous pemphigoid: Secondary | ICD-10-CM | POA: Insufficient documentation

## 2019-11-06 NOTE — Progress Notes (Signed)
Pt states that she is now doing a Paleo diet, gluten,dairy,and sugar free (no artificial sweeteners). She started this in hopes that it will help with the Bullae.

## 2019-11-06 NOTE — Progress Notes (Signed)
Established Patient Office Visit  Subjective:  Patient ID: [name], female    DOB: October 02, 1945  Age: 74 y.o. MRN: [id number]  CC:  Chief Complaint  Patient presents with  . Follow-up    HPI [name] presents for follow-up of skin lesions she is still gotten a few new lesions she has been trying the new clobetasol steroid ointment and says it actually has been helping much better than the previous steroid ointment that I had given her she is here today to go over the results and discuss possible bullous pemphigoid diagnosis.  Past Medical History:  Diagnosis Date  . Allergic rhinitis, cause unspecified   . Anxiety   . Benign paroxysmal positional vertigo   . Cancer ([place])    basal cell carcinoma per right side of nostril  . CHF (congestive heart failure) ([place]) 11/09/2016   had 2+ edema in legs with anxiety and SOB  . Complication of anesthesia    has 4  degenerative discs in neck.  . Depressive disorder, not elsewhere classified   . Esophageal reflux   . History of hiatal hernia   . Hypertension   . Localized osteoarthrosis not specified whether primary or secondary, lower leg   . Other and unspecified hyperlipidemia   . Other diseases of vocal cords   . Peptic ulcer, unspecified site, unspecified as acute or chronic, without mention of hemorrhage, perforation, or obstruction   . Pneumonia    bilat pneumonia 1987  . Pre-diabetes    no meds,just diet controll  . TMJ (temporomandibular joint disorder)   . Unspecified arthropathy, hand   . Unspecified asthma(493.90)    triggered with a virus   . Unspecified hypothyroidism     Past Surgical History:  Procedure Laterality Date  . BREAST BIOPSY  09/07/2011   High Risk Lesion  . BREAST EXCISIONAL BIOPSY Left   . BREAST SURGERY     LUMPECTOMY / LEFT 10/12/2011  . KNEE ARTHROSCOPY     bilat   . NASAL SINUS SURGERY    . ROTATOR CUFF REPAIR     right   . thumb surgery      left hand 45 years ago   . TOTAL  HIP ARTHROPLASTY Right 08/10/2014   Procedure: RIGHT TOTAL HIP ARTHROPLASTY ANTERIOR APPROACH;  Surgeon: Paralee Cancel, MD;  Location: [facility];  Service: Orthopedics;  Laterality: Right;  . TOTAL KNEE ARTHROPLASTY Left 08/26/2018   Procedure: TOTAL KNEE ARTHROPLASTY;  Surgeon: Paralee Cancel, MD;  Location: [facility];  Service: Orthopedics;  Laterality: Left;  70 mins    Family History  Problem Relation Age of Onset  . Heart disease Father   . Colon cancer Mother   . Liver cancer Brother   . Asthma Brother     Social History   Socioeconomic History  . Marital status: Married    Spouse name: [name]  . Number of children: 3  . Years of education: 16  . Highest education level: Bachelor's degree (e.g., BA, AB, BS)  Occupational History  . Occupation: Retired    Comment: Pharmacist, hospital  Tobacco Use  . Smoking status: Former Smoker    Packs/day: 0.30    Years: 2.00    Pack years: 0.60    Types: Cigarettes    Quit date: 02/12/1958    Years since quitting: 61.7  . Smokeless tobacco: Never Used  Vaping Use  . Vaping Use: Never used  Substance and Sexual Activity  . Alcohol use: Yes    Alcohol/week:  1.0 standard drink    Types: 1 Glasses of wine per week    Comment: wine socially  . Drug use: No  . Sexual activity: Yes  Other Topics Concern  . Not on file  Social History Narrative   Helps watch grand babys   Social Determinants of Health   Financial Resource Strain:   . Difficulty of Paying Living Expenses: Not on file  Food Insecurity:   . Worried About Charity fundraiser in the Last Year: Not on file  . Ran Out of Food in the Last Year: Not on file  Transportation Needs:   . Lack of Transportation (Medical): Not on file  . Lack of Transportation (Non-Medical): Not on file  Physical Activity:   . Days of Exercise per Week: Not on file  . Minutes of Exercise per Session: Not on file  Stress:   . Feeling of Stress : Not on file  Social Connections:   . Frequency of Communication  with Friends and Family: Not on file  . Frequency of Social Gatherings with Friends and Family: Not on file  . Attends Religious Services: Not on file  . Active Member of Clubs or Organizations: Not on file  . Attends Archivist Meetings: Not on file  . Marital Status: Not on file  Intimate Partner Violence:   . Fear of Current or Ex-Partner: Not on file  . Emotionally Abused: Not on file  . Physically Abused: Not on file  . Sexually Abused: Not on file    Outpatient Medications Prior to Visit  Medication Sig Dispense Refill  . albuterol (PROVENTIL HFA) 108 (90 Base) MCG/ACT inhaler Inhale 2 puffs into the lungs every 6 (six) hours as needed for shortness of breath. 6.7 g 5  . amoxicillin (AMOXIL) 500 MG tablet SMARTSIG:4 Tablet(s) By Mouth    . Ascorbic Acid (VITAMIN C WITH ROSE HIPS) 1000 MG tablet Take 1,000 mg by mouth daily.    Marland Kitchen atorvastatin (LIPITOR) 10 MG tablet Take 10 mg by mouth daily.    . budesonide-formoterol (SYMBICORT) 80-4.5 MCG/ACT inhaler Inhale 2 puffs into the lungs 2 (two) times daily. 30.6 Inhaler 3  . buPROPion (WELLBUTRIN XL) 150 MG 24 hr tablet Take 150 mg by mouth daily.     . Calcium Carb-Cholecalciferol (CALCIUM 600+D3 PO) Take 1 tablet by mouth 2 (two) times a day.    . Carboxymethylcell-Hypromellose (GENTEAL OP) Place 1-2 drops into both eyes 3 (three) times daily as needed (for dry eyes).    . Cholecalciferol (VITAMIN D3) 50 MCG (2000 UT) capsule Take 200 Units by mouth daily.     . clobetasol ointment (TEMOVATE) 6.38 % Apply 1 application topically 2 (two) times daily. To affect lesions on skin 30 g 0  . clonazePAM (KLONOPIN) 0.5 MG tablet Take 0.25 mg by mouth 2 (two) times daily as needed for anxiety.    . [employer] ANTISEPTIC SKIN CLEANSER 4 % SOLN Apply topically daily.    . famotidine (PEPCID) 20 MG tablet Take 20 mg by mouth at bedtime.     . fexofenadine (ALLEGRA) 180 MG tablet Take 180 mg by mouth at bedtime.    . folic acid (FOLVITE) 1 MG  tablet Take 1 mg by mouth 2 (two) times daily.      . Ginkgo Biloba 80 MG CAPS Take 400 mg by mouth daily.     . hydrochlorothiazide (HYDRODIURIL) 25 MG tablet TAKE 1 TABLET BY MOUTH EVERY DAY 90 tablet 2  .  L-Lysine HCl 500 MG TABS Take 500 mg by mouth daily.     Marland Kitchen levothyroxine (SYNTHROID, LEVOTHROID) 75 MCG tablet Take 75 mcg by mouth See admin instructions. Take 75 mcg by mouth daily on all days except Sunday    . levothyroxine (SYNTHROID, LEVOTHROID) 88 MCG tablet Take 88 mcg by mouth every Sunday.     . losartan (COZAAR) 50 MG tablet Take 1 tablet (50 mg total) by mouth daily. 90 tablet 3  . Magnesium 100 MG TABS Take 150 mg by mouth 2 (two) times a day.    . meloxicam (MOBIC) 15 MG tablet TAKE 1 TABLET (15 MG TOTAL) BY MOUTH EVERY MORNING. 90 tablet 1  . metroNIDAZOLE (METROGEL) 1 % gel Apply topically daily. (Patient taking differently: Apply 1 application topically as needed. ) 60 g 1  . minoxidil (ROGAINE) 2 % external solution Apply topically 2 (two) times daily.    . mupirocin ointment (BACTROBAN) 2 % Apply topically 2 (two) times daily. To open sores on skin 22 g 0  . OVER THE COUNTER MEDICATION Take 1 tablet by mouth daily. Super Joint Support Supplement    . pantoprazole (PROTONIX) 40 MG tablet TAKE 1 TABLET BY MOUTH EVERY DAY 90 tablet 3  . PARoxetine (PAXIL-CR) 12.5 MG 24 hr tablet Take 25 mg by mouth 2 (two) times daily.     Marland Kitchen triamcinolone cream (KENALOG) 0.1 % Apply 1 application topically 2 (two) times daily. To the closed itchy bumps on the skin 30 g 0  . chlorhexidine (HIBICLENS) 4 % external liquid cleanse wounds daily and pat dry. 120 mL 0   No facility-administered medications prior to visit.    Allergies  Allergen Reactions  . Astelin [Azelastine Hcl] Other (See Comments)    Headache  . Celebrex [Celecoxib] Other (See Comments)    Stomach issues  . Lisinopril Other (See Comments) and Cough    ACE cough  . Methotrexate Derivatives Other (See Comments)  .  Morphine Nausea Only  . Moxifloxacin Other (See Comments)    REACTION: confusion, dizziness, paranoia    ROS Review of Systems    Objective:    Physical Exam Vitals reviewed.  Constitutional:      Appearance: She is well-developed.  HENT:     Head: Normocephalic and atraumatic.  Eyes:     Conjunctiva/sclera: Conjunctivae normal.  Cardiovascular:     Rate and Rhythm: Normal rate.  Pulmonary:     Effort: Pulmonary effort is normal.  Skin:    General: Skin is dry.     Coloration: Skin is not pale.  Neurological:     Mental Status: She is alert and oriented to person, place, and time.  Psychiatric:        Behavior: Behavior normal.         BP (!) 116/56   Pulse 81   Ht 5\' 2"  (1.575 m)   Wt 184 lb (83.5 kg)   SpO2 98%   BMI 33.65 kg/m  Wt Readings from Last 3 Encounters:  11/06/19 184 lb (83.5 kg)  09/16/19 182 lb (82.6 kg)  09/09/19 183 lb (83 kg)     There are no preventive care reminders to display for this patient.  There are no preventive care reminders to display for this patient.  Lab Results  Component Value Date   TSH 0.48 08/07/2018   Lab Results  Component Value Date   WBC 9.2 09/16/2019   HGB 11.6 (L) 09/16/2019   HCT 34.9 (L) 09/16/2019  MCV 84.3 09/16/2019   PLT 406 (H) 09/16/2019   Lab Results  Component Value Date   NA 138 09/16/2019   K 4.1 09/16/2019   CO2 31 09/16/2019   GLUCOSE 112 09/16/2019   BUN 17 09/16/2019   CREATININE 0.73 09/16/2019   BILITOT 0.4 09/16/2019   ALKPHOS 57 08/07/2018   AST 12 09/16/2019   ALT 19 09/16/2019   PROT 6.2 09/16/2019   ALBUMIN 4.0 01/16/2016   CALCIUM 9.1 09/16/2019   ANIONGAP 9 08/27/2018   Lab Results  Component Value Date   CHOL 195 08/07/2018   Lab Results  Component Value Date   HDL 59 08/07/2018   Lab Results  Component Value Date   LDLCALC 126 08/07/2018   Lab Results  Component Value Date   TRIG 110 08/07/2018   Lab Results  Component Value Date   CHOLHDL 3.6  CALC 09/18/2006   Lab Results  Component Value Date   HGBA1C 5.9 07/02/2019      Assessment & Plan:   Problem List Items Addressed This Visit      Other   Bullous pemphigoid - Primary    Seen most consistent with possible bullous pemphigoid.  Discussed that I would like to refer her to dermatology for further evaluation just for confirmation.  I did send a second biopsy for DIF but unfortunately did not send it in the right medium so it could not be performed.  But her lesions are most characteristic.  She does seem to be responding to the stronger topical steroid cream which is very reassuring.  Given additional patient handout from up-to-date which I reviewed with her today.  She did want to let me know that she is cutting out dairy and gluten to reduce inflammation in her body to see if this helps.      Relevant Orders   Ambulatory referral to Dermatology      No orders of the defined types were placed in this encounter.   Follow-up: Return if symptoms worsen or fail to improve.   Time spent 30 min in encounter.    Beatrice Lecher, MD

## 2019-11-06 NOTE — Assessment & Plan Note (Signed)
Seen most consistent with possible bullous pemphigoid.  Discussed that I would like to refer her to dermatology for further evaluation just for confirmation.  I did send a second biopsy for DIF but unfortunately did not send it in the right medium so it could not be performed.  But her lesions are most characteristic.  She does seem to be responding to the stronger topical steroid cream which is very reassuring.  Given additional patient handout from up-to-date which I reviewed with her today.  She did want to let me know that she is cutting out dairy and gluten to reduce inflammation in her body to see if this helps.

## 2019-11-06 NOTE — Telephone Encounter (Signed)
Pt has an appt today.  

## 2019-11-09 ENCOUNTER — Encounter: Payer: Self-pay | Admitting: Family Medicine

## 2019-11-09 DIAGNOSIS — Z7189 Other specified counseling: Secondary | ICD-10-CM | POA: Insufficient documentation

## 2019-11-09 DIAGNOSIS — E118 Type 2 diabetes mellitus with unspecified complications: Secondary | ICD-10-CM | POA: Insufficient documentation

## 2019-11-09 NOTE — Progress Notes (Signed)
Cardiology Office Note   Date:  11/10/2019   ID:  [name], DOB [birth year], MRN [id number]  PCP:  Hali Marry, MD  Cardiologist:   Minus Breeding, MD   Chief Complaint  Patient presents with  . Dyslipidemia      History of Present Illness: [name] is a 74 y.o. female who presents for evaluation of chest pain.  In the past she has had dyspnea and chest pain.  However, echocardiogram was unremarkable in 2015. There is no evidence of ischemia on stress perfusion study in 2017.  She had a negative POET (Plain Old Exercise Treadmill) in 2019 secondary to these symptoms and risk factors.     Since I last saw her she has done okay.  She developed bullous pemphigoid and is changing her diet to see if this might help as well as seeing her dermatologist and is going to start steroid therapy. The patient denies any new symptoms such as chest discomfort, neck or arm discomfort. There has been no new shortness of breath, PND or orthopnea. There have been no reported palpitations, presyncope or syncope.   Past Medical History:  Diagnosis Date  . Allergic rhinitis, cause unspecified   . Anxiety   . Benign paroxysmal positional vertigo   . Cancer ([place])    basal cell carcinoma per right side of nostril  . CHF (congestive heart failure) ([place]) 11/09/2016   had 2+ edema in legs with anxiety and SOB  . Complication of anesthesia    has 4  degenerative discs in neck.  . Depressive disorder, not elsewhere classified   . Esophageal reflux   . History of hiatal hernia   . Hypertension   . Localized osteoarthrosis not specified whether primary or secondary, lower leg   . Other and unspecified hyperlipidemia   . Other diseases of vocal cords   . Peptic ulcer, unspecified site, unspecified as acute or chronic, without mention of hemorrhage, perforation, or obstruction   . Pneumonia    bilat pneumonia 1987  . Pre-diabetes    no meds,just diet controll  . TMJ  (temporomandibular joint disorder)   . Unspecified arthropathy, hand   . Unspecified asthma(493.90)    triggered with a virus   . Unspecified hypothyroidism     Past Surgical History:  Procedure Laterality Date  . BREAST BIOPSY  09/07/2011   High Risk Lesion  . BREAST EXCISIONAL BIOPSY Left   . BREAST SURGERY     LUMPECTOMY / LEFT 10/12/2011  . KNEE ARTHROSCOPY     bilat   . NASAL SINUS SURGERY    . ROTATOR CUFF REPAIR     right   . thumb surgery      left hand 45 years ago   . TOTAL HIP ARTHROPLASTY Right 08/10/2014   Procedure: RIGHT TOTAL HIP ARTHROPLASTY ANTERIOR APPROACH;  Surgeon: Paralee Cancel, MD;  Location: [facility];  Service: Orthopedics;  Laterality: Right;  . TOTAL KNEE ARTHROPLASTY Left 08/26/2018   Procedure: TOTAL KNEE ARTHROPLASTY;  Surgeon: Paralee Cancel, MD;  Location: [facility];  Service: Orthopedics;  Laterality: Left;  70 mins     Current Outpatient Medications  Medication Sig Dispense Refill  . albuterol (PROVENTIL HFA) 108 (90 Base) MCG/ACT inhaler Inhale 2 puffs into the lungs every 6 (six) hours as needed for shortness of breath. 6.7 g 5  . Ascorbic Acid (VITAMIN C WITH ROSE HIPS) 1000 MG tablet Take 1,000 mg by mouth daily.    Marland Kitchen atorvastatin (LIPITOR) 10  MG tablet Take 10 mg by mouth daily.    . budesonide-formoterol (SYMBICORT) 80-4.5 MCG/ACT inhaler Inhale 2 puffs into the lungs 2 (two) times daily. 30.6 Inhaler 3  . buPROPion (WELLBUTRIN XL) 150 MG 24 hr tablet Take 150 mg by mouth daily.     . Calcium Carb-Cholecalciferol (CALCIUM 600+D3 PO) Take 1 tablet by mouth 2 (two) times a day.    . Carboxymethylcell-Hypromellose (GENTEAL OP) Place 1-2 drops into both eyes 3 (three) times daily as needed (for dry eyes).    . Cholecalciferol (VITAMIN D3) 50 MCG (2000 UT) capsule Take 200 Units by mouth daily.     . clobetasol ointment (TEMOVATE) 2.53 % Apply 1 application topically 2 (two) times daily. To affect lesions on skin 30 g 0  . clonazePAM (KLONOPIN) 0.5 MG  tablet Take 0.25 mg by mouth 2 (two) times daily as needed for anxiety.    . CVS ANTISEPTIC SKIN CLEANSER 4 % SOLN Apply topically daily.    . famotidine (PEPCID) 20 MG tablet Take 20 mg by mouth at bedtime.     . fexofenadine (ALLEGRA) 180 MG tablet Take 180 mg by mouth at bedtime.    . folic acid (FOLVITE) 1 MG tablet Take 1 mg by mouth 2 (two) times daily.      . hydrochlorothiazide (HYDRODIURIL) 25 MG tablet TAKE 1 TABLET BY MOUTH EVERY DAY 90 tablet 2  . levothyroxine (SYNTHROID, LEVOTHROID) 75 MCG tablet Take 75 mcg by mouth See admin instructions. Take 75 mcg by mouth daily on all days except Sunday    . levothyroxine (SYNTHROID, LEVOTHROID) 88 MCG tablet Take 88 mcg by mouth every Sunday.     . losartan (COZAAR) 50 MG tablet Take 1 tablet (50 mg total) by mouth daily. 90 tablet 3  . Magnesium 100 MG TABS Take 150 mg by mouth 2 (two) times a day.    . meloxicam (MOBIC) 15 MG tablet TAKE 1 TABLET (15 MG TOTAL) BY MOUTH EVERY MORNING. 90 tablet 1  . metroNIDAZOLE (METROGEL) 1 % gel Apply topically daily. (Patient taking differently: Apply 1 application topically as needed. ) 60 g 1  . mupirocin ointment (BACTROBAN) 2 % Apply topically 2 (two) times daily. To open sores on skin 22 g 0  . OVER THE COUNTER MEDICATION Take 1 tablet by mouth daily. Super Joint Support Supplement    . pantoprazole (PROTONIX) 40 MG tablet TAKE 1 TABLET BY MOUTH EVERY DAY 90 tablet 3  . PARoxetine (PAXIL-CR) 12.5 MG 24 hr tablet Take 25 mg by mouth 2 (two) times daily.     Marland Kitchen triamcinolone cream (KENALOG) 0.1 % Apply 1 application topically 2 (two) times daily. To the closed itchy bumps on the skin 30 g 0   No current facility-administered medications for this visit.    Allergies:   Astelin [azelastine hcl], Celebrex [celecoxib], Lisinopril, Methotrexate derivatives, Morphine, and Moxifloxacin    ROS:  Please see the history of present illness.   Otherwise, review of systems are positive for none.   All other  systems are reviewed and negative.    PHYSICAL EXAM: VS:  BP 130/64   Pulse 77   Ht 5\' 2"  (1.575 m)   Wt 183 lb 12.8 oz (83.4 kg)   SpO2 96%   BMI 33.62 kg/m  , BMI Body mass index is 33.62 kg/m. GENERAL:  Well appearing NECK:  No jugular venous distention, waveform within normal limits, carotid upstroke brisk and symmetric, no bruits, no thyromegaly LUNGS:  Clear to auscultation bilaterally CHEST:  Unremarkable HEART:  PMI not displaced or sustained,S1 and S2 within normal limits, no S3, no S4, no clicks, no rubs, no murmurs ABD:  Flat, positive bowel sounds normal in frequency in pitch, no bruits, no rebound, no guarding, no midline pulsatile mass, no hepatomegaly, no splenomegaly EXT:  2 plus pulses throughout, no edema, no cyanosis no clubbing   EKG:  EKG is ordered today. The ekg ordered today demonstrates sinus rhythm, rate 77, left axis deviation, left anterior fascicular block, poor anterior R wave progression, no acute ST-T wave changes, no change from previous.   Recent Labs: 09/16/2019: Hemoglobin 11.6; Platelets 406 10/30/2019: ALT 38; BUN 19; Creatinine 0.7; Potassium 4.2; Sodium 138; TSH 0.70    Lipid Panel    Component Value Date/Time   CHOL 199 10/30/2019 0000   TRIG 162 (A) 10/30/2019 0000   HDL 55 10/30/2019 0000   CHOLHDL 3.6 CALC 09/18/2006 1225   VLDL 16 09/18/2006 1225   LDLCALC 123 10/30/2019 0000   LDLDIRECT 146.7 09/18/2006 1225      Wt Readings from Last 3 Encounters:  11/10/19 183 lb 12.8 oz (83.4 kg)  11/06/19 184 lb (83.5 kg)  09/16/19 182 lb (82.6 kg)      Other studies Reviewed: Additional studies/ records that were reviewed today include: Labs. Review of the above records demonstrates:  Please see elsewhere in the note.     ASSESSMENT AND PLAN:  CHEST PAIN:    She had negative stress test in 2019 no new symptoms since then.  No change in therapy.  DYSPNEA:     This is baseline and probably related slightly to weight.  No change  in therapy or further work-up.  AORTIC ATHEROSCLEROSIS:   This was noted on a previous CT and we are pursuing risk reduction.  DM:  Most recent A1C is 6.2.  I have asked her to have this rechecked as she is going to change her diet and is planning on doing a paleo diet.   DYSLIPIDEMIA:   This needs to be checked in about 3 months after she changes her diet.  I think the goal LDL should be less than 100.  COVID EDUCATION: She has been vaccinated.  Current medicines are reviewed at length with the patient today.  The patient does not have concerns regarding medicines.  The following changes have been made:  no change  Labs/ tests ordered today include: None  Orders Placed This Encounter  Procedures  . EKG 12-Lead     Disposition:   FU with me in 1 year.   Signed, Minus Breeding, MD  11/10/2019 2:57 PM    [facility]

## 2019-11-10 ENCOUNTER — Encounter: Payer: Self-pay | Admitting: Cardiology

## 2019-11-10 ENCOUNTER — Other Ambulatory Visit: Payer: Self-pay

## 2019-11-10 ENCOUNTER — Ambulatory Visit (INDEPENDENT_AMBULATORY_CARE_PROVIDER_SITE_OTHER): Payer: Medicare Other | Admitting: Cardiology

## 2019-11-10 VITALS — BP 130/64 | HR 77 | Ht 62.0 in | Wt 183.8 lb

## 2019-11-10 DIAGNOSIS — R0602 Shortness of breath: Secondary | ICD-10-CM | POA: Diagnosis not present

## 2019-11-10 DIAGNOSIS — Z7189 Other specified counseling: Secondary | ICD-10-CM

## 2019-11-10 DIAGNOSIS — E785 Hyperlipidemia, unspecified: Secondary | ICD-10-CM

## 2019-11-10 DIAGNOSIS — R072 Precordial pain: Secondary | ICD-10-CM

## 2019-11-10 DIAGNOSIS — E118 Type 2 diabetes mellitus with unspecified complications: Secondary | ICD-10-CM

## 2019-11-10 DIAGNOSIS — I700 Atherosclerosis of aorta: Secondary | ICD-10-CM | POA: Diagnosis not present

## 2019-11-10 NOTE — Patient Instructions (Signed)
Medication Instructions:  Your physician recommends that you continue on your current medications as directed. Please refer to the Current Medication list given to you today.  *If you need a refill on your cardiac medications before your next appointment, please call your pharmacy*  Lab Work: NONE ordered at this time of appointment   If you have labs (blood work) drawn today and your tests are completely normal, you will receive your results only by: MyChart Message (if you have MyChart) OR A paper copy in the mail If you have any lab test that is abnormal or we need to change your treatment, we will call you to review the results.  Testing/Procedures: NONE ordered at this time of appointment   Follow-Up: At [facility], you and your health needs are our priority.  As part of our continuing mission to provide you with exceptional heart care, we have created designated Provider Care Teams.  These Care Teams include your primary Cardiologist (physician) and Advanced Practice Providers (APPs -  Physician Assistants and Nurse Practitioners) who all work together to provide you with the care you need, when you need it.  Your next appointment:   1 year(s)  The format for your next appointment:   In Person  Provider:   James Hochrein, MD  Other Instructions   

## 2019-11-15 ENCOUNTER — Other Ambulatory Visit: Payer: Self-pay | Admitting: Family Medicine

## 2019-11-24 DIAGNOSIS — F410 Panic disorder [episodic paroxysmal anxiety] without agoraphobia: Secondary | ICD-10-CM | POA: Diagnosis not present

## 2019-11-24 DIAGNOSIS — F902 Attention-deficit hyperactivity disorder, combined type: Secondary | ICD-10-CM | POA: Diagnosis not present

## 2019-11-24 DIAGNOSIS — F422 Mixed obsessional thoughts and acts: Secondary | ICD-10-CM | POA: Diagnosis not present

## 2019-11-24 DIAGNOSIS — Z20822 Contact with and (suspected) exposure to covid-19: Secondary | ICD-10-CM | POA: Diagnosis not present

## 2019-12-15 DIAGNOSIS — F902 Attention-deficit hyperactivity disorder, combined type: Secondary | ICD-10-CM | POA: Diagnosis not present

## 2019-12-15 DIAGNOSIS — F422 Mixed obsessional thoughts and acts: Secondary | ICD-10-CM | POA: Diagnosis not present

## 2019-12-15 DIAGNOSIS — F410 Panic disorder [episodic paroxysmal anxiety] without agoraphobia: Secondary | ICD-10-CM | POA: Diagnosis not present

## 2019-12-25 ENCOUNTER — Ambulatory Visit: Payer: Medicare Other

## 2019-12-28 ENCOUNTER — Ambulatory Visit (INDEPENDENT_AMBULATORY_CARE_PROVIDER_SITE_OTHER): Payer: Medicare Other | Admitting: Physician Assistant

## 2019-12-28 ENCOUNTER — Encounter: Payer: Self-pay | Admitting: Physician Assistant

## 2019-12-28 ENCOUNTER — Other Ambulatory Visit: Payer: Self-pay

## 2019-12-28 VITALS — BP 142/77 | HR 74 | Ht 62.0 in | Wt 186.0 lb

## 2019-12-28 DIAGNOSIS — M47812 Spondylosis without myelopathy or radiculopathy, cervical region: Secondary | ICD-10-CM | POA: Diagnosis not present

## 2019-12-28 DIAGNOSIS — Z23 Encounter for immunization: Secondary | ICD-10-CM

## 2019-12-28 DIAGNOSIS — R202 Paresthesia of skin: Secondary | ICD-10-CM | POA: Diagnosis not present

## 2019-12-28 DIAGNOSIS — R200 Anesthesia of skin: Secondary | ICD-10-CM

## 2019-12-28 DIAGNOSIS — R519 Headache, unspecified: Secondary | ICD-10-CM | POA: Diagnosis not present

## 2019-12-28 NOTE — Patient Instructions (Signed)
[employer] [employer] [place].

## 2019-12-28 NOTE — Progress Notes (Signed)
Subjective:    Patient ID: [name], female    DOB: [birth year], 74 y.o.   MRN: [id number]  HPI  Patient is a 74 year old female with hypertension, OCD, spondylosis of cervical region who presents to the clinic with some numbness and tingling concerns.  For the last 4 weeks she has had some numbness coming from her right neck base and radiating up into the occipital region of her head.  When she palpates this area there is some tenderness.  She denies any vision changes, upper extremity strength changes, headache, speech changes or facial asymmetry.  No memory changes.  The numbness does not seem to be worsening.  Her neck does seem to be a bit more stiff.  She has ongoing issues with her neck pain.  She is seeing Dr. Dossie Der and he is tried injections.  She is done weeks of physical therapy and dry needling.  Her best regimen has been TENS unit, CBD oil, Biofreeze, heat.  After 4 weeks she is just got concerned that could be a stroke.   Active Ambulatory Problems    Diagnosis Date Noted  . Hypothyroidism 09/24/2006  . HYPERLIPIDEMIA 09/23/2006  . DEPRESSION 09/24/2006  . VERTIGO, BENIGN PAROXYSMAL POSITION 09/24/2006  . Allergic rhinitis 11/05/2006  . VOCAL CORD DISORDER 11/05/2006  . Asthma with allergic rhinitis 11/05/2006  . GERD 09/24/2006  . Rheumatoid arthritis with rheumatoid factor ([place]) 11/30/2008  . LOC OSTEOARTHROS NOT SPEC PRIM/SEC LOWER LEG 03/19/2007  . SOMNOLENCE 11/25/2009  . POSTMENOPAUSAL STATUS 11/30/2008  . Abnormal liver enzymes 10/25/2010  . CARRIER/SUSPECTED CARRIER GROUP B STREPTOCOCCUS 11/17/2010  . Obesity (BMI 30-39.9) 05/03/2011  . Abnormal mammogram with microcalcification 10/04/2011  . Atypical ductal hyperplasia of breast 10/29/2011  . OCD (obsessive compulsive disorder) 05/09/2012  . Oral herpes 06/23/2012  . Vitreous detachment 09/23/2012  . IFG (impaired fasting glucose) 01/25/2014  . Essential hypertension, benign 05/26/2014  . Primary  osteoarthritis of right hip 05/27/2014  . S/P right THA, AA 08/10/2014  . Venous stasis ulcer of left lower extremity ([place]) 03/03/2015  . Tendon nodule 01/16/2016  . Fatty liver 11/09/2016  . Vitamin D deficiency 06/19/2016  . Chronic fatigue 06/19/2016  . Alopecia 06/19/2016  . Aortic atherosclerosis ([place]) 09/26/2017  . S/P left TKA 08/26/2018  . Overweight (BMI 25.0-29.9) 08/27/2018  . Pain in right knee 01/03/2018  . Lumbar degenerative disc disease 11/07/2018  . Cervical pain 11/07/2018  . Drug-induced constipation 05/25/2019  . Diarrhea 05/25/2019  . Lower abdominal pain 05/25/2019  . Blood in stool 05/25/2019  . Internal hemorrhoids 05/25/2019  . Family history of colon cancer 05/25/2019  . Acute cystitis 09/09/2019  . Right renal stone 09/21/2019  . Bullous pemphigoid 11/06/2019  . Educated about COVID-19 virus infection 11/09/2019  . Type 2 diabetes mellitus with complication, without long-term current use of insulin ([place]) 11/09/2019  . Occipital pain 12/28/2019   Resolved Ambulatory Problems    Diagnosis Date Noted  . EUSTACHIAN TUBE DYSFUNCTION, LEFT 04/13/2009  . Acute maxillary sinusitis 01/19/2009  . PEPTIC ULCER DISEASE 11/05/2006  . ARTHRITIS, HANDS, BILATERAL 12/22/2007  . Precordial pain 06/23/2008  . Chest pain 10/25/2010  . Dyspnea 10/25/2010  . Dyspnea 06/26/2013  . ACE-inhibitor cough 05/26/2014  . Preoperative clearance 08/03/2014  . Acute sinusitis 04/12/2015  . Pain of left hand 05/22/2017   Past Medical History:  Diagnosis Date  . Allergic rhinitis, cause unspecified   . Anxiety   . Cancer ([place])   . CHF (congestive heart failure) (  [place]) 11/09/2016  . Complication of anesthesia   . History of hiatal hernia   . Hypertension   . Pneumonia   . Pre-diabetes   . TMJ (temporomandibular joint disorder)   . Unspecified asthma(493.90)   . Unspecified hypothyroidism      Review of Systems See HPI.     Objective:   Physical Exam Vitals  reviewed.  Constitutional:      Appearance: Normal appearance.  HENT:     Head: Normocephalic.     Comments: No scalp redness or rash.  Eyes:     Extraocular Movements: Extraocular movements intact.     Conjunctiva/sclera: Conjunctivae normal.     Pupils: Pupils are equal, round, and reactive to light.  Cardiovascular:     Rate and Rhythm: Normal rate and regular rhythm.     Pulses: Normal pulses.  Pulmonary:     Effort: Pulmonary effort is normal.  Musculoskeletal:     Cervical back: Normal range of motion.     Right lower leg: No edema.     Left lower leg: No edema.     Comments: Upper ext strength intact, bilaterally.  ROM of neck decreased because of pain and stiffness.  Tenderness along the cervical paraspinal muscles of neck, bilaterally but numbness from right neck base into occipital head.  Hand grip 5/5.   Neurological:     General: No focal deficit present.     Mental Status: She is alert and oriented to person, place, and time.     Cranial Nerves: No cranial nerve deficit.     Sensory: No sensory deficit.     Motor: No weakness.     Coordination: Coordination normal.     Gait: Gait normal.  Psychiatric:        Mood and Affect: Mood normal.           Assessment & Plan:  Marland KitchenMarland KitchenWandalene was seen today for numbness.  Diagnoses and all orders for this visit:  Occipital pain  Flu vaccine need -     Flu Vaccine QUAD High Dose(Fluad)  Numbness and tingling sensation of skin   No red flags on exam today.  Suspect occipital nerve inflamed. It could be coming from all her neck problems. She has not had massage in a while.  Encouraged to get neck massage. Continue tens unit, biofreeze, heating pads, CBD oil. Discussed using gabapentin. She stated she did not want to try.  No signs of rash to think shingles.  If symptoms persist change or worsen let us know.  I will send note to PcP for review and see if she would like to do any more work up. Follow up in few weeks  with PCP.   Spent 30 minutes with patient discussing plan and treatment.

## 2019-12-29 ENCOUNTER — Encounter: Payer: Self-pay | Admitting: Physician Assistant

## 2019-12-29 DIAGNOSIS — R202 Paresthesia of skin: Secondary | ICD-10-CM | POA: Insufficient documentation

## 2019-12-29 DIAGNOSIS — M47812 Spondylosis without myelopathy or radiculopathy, cervical region: Secondary | ICD-10-CM | POA: Insufficient documentation

## 2020-01-05 ENCOUNTER — Other Ambulatory Visit: Payer: Self-pay | Admitting: Family Medicine

## 2020-01-15 ENCOUNTER — Other Ambulatory Visit: Payer: Self-pay | Admitting: Family Medicine

## 2020-01-19 DIAGNOSIS — F902 Attention-deficit hyperactivity disorder, combined type: Secondary | ICD-10-CM | POA: Diagnosis not present

## 2020-01-19 DIAGNOSIS — F410 Panic disorder [episodic paroxysmal anxiety] without agoraphobia: Secondary | ICD-10-CM | POA: Diagnosis not present

## 2020-01-19 DIAGNOSIS — F422 Mixed obsessional thoughts and acts: Secondary | ICD-10-CM | POA: Diagnosis not present

## 2020-01-20 DIAGNOSIS — L139 Bullous disorder, unspecified: Secondary | ICD-10-CM | POA: Diagnosis not present

## 2020-01-20 DIAGNOSIS — L821 Other seborrheic keratosis: Secondary | ICD-10-CM | POA: Diagnosis not present

## 2020-01-20 DIAGNOSIS — Z85828 Personal history of other malignant neoplasm of skin: Secondary | ICD-10-CM | POA: Diagnosis not present

## 2020-01-20 DIAGNOSIS — L918 Other hypertrophic disorders of the skin: Secondary | ICD-10-CM | POA: Diagnosis not present

## 2020-01-20 DIAGNOSIS — D1801 Hemangioma of skin and subcutaneous tissue: Secondary | ICD-10-CM | POA: Diagnosis not present

## 2020-01-28 ENCOUNTER — Encounter: Payer: Self-pay | Admitting: Emergency Medicine

## 2020-01-28 ENCOUNTER — Other Ambulatory Visit: Payer: Self-pay

## 2020-01-28 ENCOUNTER — Emergency Department (INDEPENDENT_AMBULATORY_CARE_PROVIDER_SITE_OTHER)
Admission: EM | Admit: 2020-01-28 | Discharge: 2020-01-28 | Disposition: A | Discharge status: Home / Self Care | Payer: Medicare Other | Source: Home / Self Care

## 2020-01-28 DIAGNOSIS — R52 Pain, unspecified: Secondary | ICD-10-CM

## 2020-01-28 DIAGNOSIS — J029 Acute pharyngitis, unspecified: Secondary | ICD-10-CM | POA: Diagnosis not present

## 2020-01-28 LAB — POCT RAPID STREP A (OFFICE): Rapid Strep A Screen: NEGATIVE

## 2020-01-28 NOTE — Discharge Instructions (Addendum)
  You may take 500mg  acetaminophen every 4-6 hours or in combination with ibuprofen 400mg  every 6-8 hours as needed for pain, inflammation, and fever.  Be sure to well hydrated with clear liquids and get at least 8 hours of sleep at night, preferably more while sick.   Please follow up with family medicine in 1 week if needed.

## 2020-01-28 NOTE — ED Provider Notes (Signed)
[name]    CSN: [id number] Arrival date & time: 01/28/20  1532      History   Chief Complaint Chief Complaint  Patient presents with  . Sore Throat  . Generalized Body Aches    HPI [name] is a 74 y.o. female.   HPI  [name] is a 74 y.o. female presenting to UC with c/o gradually worsening sore throat that stated 2 days ago, she has now developed body aches and mild congestion with post-nasal drainage. Low-grade fever of 100.8*F tympanically.  Pt had a negative home COVID test the first night symptoms started. She received 2 doses of Pfizer COVID vaccine in February/March 2021 but states she will not get the booster due to a skin reaction.   Denies chest pain or SOB. No n/v/d. No known sick contacts. She received her flu vaccine over 2 weeks ago.   Past Medical History:  Diagnosis Date  . Allergic rhinitis, cause unspecified   . Anxiety   . Benign paroxysmal positional vertigo   . Cancer ([place])    basal cell carcinoma per right side of nostril  . CHF (congestive heart failure) ([place]) 11/09/2016   had 2+ edema in legs with anxiety and SOB  . Complication of anesthesia    has 4  degenerative discs in neck.  . Depressive disorder, not elsewhere classified   . Esophageal reflux   . History of hiatal hernia   . Hypertension   . Localized osteoarthrosis not specified whether primary or secondary, lower leg   . Other and unspecified hyperlipidemia   . Other diseases of vocal cords   . Peptic ulcer, unspecified site, unspecified as acute or chronic, without mention of hemorrhage, perforation, or obstruction   . Pneumonia    bilat pneumonia 1987  . Pre-diabetes    no meds,just diet controll  . TMJ (temporomandibular joint disorder)   . Unspecified arthropathy, hand   . Unspecified asthma(493.90)    triggered with a virus   . Unspecified hypothyroidism     Patient Active Problem List   Diagnosis Date Noted  . Numbness and tingling  sensation of skin 12/29/2019  . Spondylosis without myelopathy or radiculopathy, cervical region 12/29/2019  . Occipital pain 12/28/2019  . Educated about COVID-19 virus infection 11/09/2019  . Type 2 diabetes mellitus with complication, without long-term current use of insulin ([place]) 11/09/2019  . Bullous pemphigoid 11/06/2019  . Right renal stone 09/21/2019  . Acute cystitis 09/09/2019  . Drug-induced constipation 05/25/2019  . Diarrhea 05/25/2019  . Lower abdominal pain 05/25/2019  . Blood in stool 05/25/2019  . Internal hemorrhoids 05/25/2019  . Family history of colon cancer 05/25/2019  . Lumbar degenerative disc disease 11/07/2018  . Cervical pain 11/07/2018  . Overweight (BMI 25.0-29.9) 08/27/2018  . S/P left TKA 08/26/2018  . Pain in right knee 01/03/2018  . Aortic atherosclerosis ([place]) 09/26/2017  . Fatty liver 11/09/2016  . Vitamin D deficiency 06/19/2016  . Chronic fatigue 06/19/2016  . Alopecia 06/19/2016  . Tendon nodule 01/16/2016  . Venous stasis ulcer of left lower extremity ([place]) 03/03/2015  . S/P right THA, AA 08/10/2014  . Primary osteoarthritis of right hip 05/27/2014  . Essential hypertension, benign 05/26/2014  . IFG (impaired fasting glucose) 01/25/2014  . Vitreous detachment 09/23/2012  . Oral herpes 06/23/2012  . OCD (obsessive compulsive disorder) 05/09/2012  . Atypical ductal hyperplasia of breast 10/29/2011  . Abnormal mammogram with microcalcification 10/04/2011  . Obesity (BMI 30-39.9) 05/03/2011  . CARRIER/SUSPECTED  CARRIER GROUP B STREPTOCOCCUS 11/17/2010  . Abnormal liver enzymes 10/25/2010  . SOMNOLENCE 11/25/2009  . Rheumatoid arthritis with rheumatoid factor ([place]) 11/30/2008  . POSTMENOPAUSAL STATUS 11/30/2008  . LOC OSTEOARTHROS NOT SPEC PRIM/SEC LOWER LEG 03/19/2007  . Allergic rhinitis 11/05/2006  . VOCAL CORD DISORDER 11/05/2006  . Asthma with allergic rhinitis 11/05/2006  . Hypothyroidism 09/24/2006  . DEPRESSION 09/24/2006  .  VERTIGO, BENIGN PAROXYSMAL POSITION 09/24/2006  . GERD 09/24/2006  . HYPERLIPIDEMIA 09/23/2006    Past Surgical History:  Procedure Laterality Date  . BREAST BIOPSY  09/07/2011   High Risk Lesion  . BREAST EXCISIONAL BIOPSY Left   . BREAST SURGERY     LUMPECTOMY / LEFT 10/12/2011  . KNEE ARTHROSCOPY     bilat   . NASAL SINUS SURGERY    . ROTATOR CUFF REPAIR     right   . thumb surgery      left hand 45 years ago   . TOTAL HIP ARTHROPLASTY Right 08/10/2014   Procedure: RIGHT TOTAL HIP ARTHROPLASTY ANTERIOR APPROACH;  Surgeon: Paralee Cancel, MD;  Location: [facility];  Service: Orthopedics;  Laterality: Right;  . TOTAL KNEE ARTHROPLASTY Left 08/26/2018   Procedure: TOTAL KNEE ARTHROPLASTY;  Surgeon: Paralee Cancel, MD;  Location: [facility];  Service: Orthopedics;  Laterality: Left;  70 mins    OB History   No obstetric history on file.      Home Medications    Prior to Admission medications   Medication Sig Start Date End Date Taking? Authorizing Provider  albuterol (PROVENTIL HFA) 108 (90 Base) MCG/ACT inhaler Inhale 2 puffs into the lungs every 6 (six) hours as needed for shortness of breath. 05/05/19   Rigoberto Noel, MD  Ascorbic Acid (VITAMIN C WITH ROSE HIPS) 1000 MG tablet Take 1,000 mg by mouth daily.    [provider]  atorvastatin (LIPITOR) 10 MG tablet Take 10 mg by mouth daily. 10/11/19   [provider]  budesonide-formoterol (SYMBICORT) 80-4.5 MCG/ACT inhaler Inhale 2 puffs into the lungs 2 (two) times daily. 05/05/19   Rigoberto Noel, MD  buPROPion (WELLBUTRIN XL) 150 MG 24 hr tablet Take 150 mg by mouth daily.     [provider]  Calcium Carb-Cholecalciferol (CALCIUM 600+D3 PO) Take 1 tablet by mouth 2 (two) times a day.    [provider]  Carboxymethylcell-Hypromellose (GENTEAL OP) Place 1-2 drops into both eyes 3 (three) times daily as needed (for dry eyes).    [provider]  Cholecalciferol (VITAMIN D3) 50 MCG (2000 UT)  capsule Take 200 Units by mouth daily.     [provider]  clobetasol ointment (TEMOVATE) 3.76 % Apply 1 application topically 2 (two) times daily. To affect lesions on skin 11/03/19   Hali Marry, MD  clonazePAM (KLONOPIN) 0.5 MG tablet Take 0.25 mg by mouth 2 (two) times daily as needed for anxiety.    [provider]  [employer] ANTISEPTIC SKIN CLEANSER 4 % SOLN Apply topically daily. 10/22/19   [provider]  famotidine (PEPCID) 20 MG tablet Take 20 mg by mouth at bedtime.     [provider]  fexofenadine (ALLEGRA) 180 MG tablet Take 180 mg by mouth at bedtime.    [provider]  folic acid (FOLVITE) 1 MG tablet Take 1 mg by mouth 2 (two) times daily.      [provider]  hydrochlorothiazide (HYDRODIURIL) 25 MG tablet TAKE 1 TABLET BY MOUTH EVERY DAY 10/07/19   Beatrice Lecher  D, MD  levothyroxine (SYNTHROID, LEVOTHROID) 75 MCG tablet Take 75 mcg by mouth See admin instructions. Take 75 mcg by mouth daily on all days except Sunday    [provider]  levothyroxine (SYNTHROID, LEVOTHROID) 88 MCG tablet Take 88 mcg by mouth every Sunday.     [provider]  Lido-Menthol-Methyl Sal-Camph (CBD KINGS EX) Apply topically.    [provider]  losartan (COZAAR) 50 MG tablet TAKE 1 TABLET BY MOUTH EVERY DAY 11/16/19   Hali Marry, MD  Magnesium 100 MG TABS Take 150 mg by mouth 2 (two) times a day.    [provider]  meloxicam (MOBIC) 15 MG tablet TAKE 1 TABLET (15 MG TOTAL) BY MOUTH EVERY MORNING. 01/15/20   Hali Marry, MD  Menthol, Topical Analgesic, (BIOFREEZE EX) Apply topically.    [provider]  metroNIDAZOLE (METROGEL) 1 % gel Apply topically daily. Patient taking differently: Apply 1 application topically as needed.  09/22/18   Hali Marry, MD  mupirocin ointment (BACTROBAN) 2 % Apply topically 2 (two) times daily. To open sores on skin 10/22/19   Hali Marry, MD  OVER THE COUNTER MEDICATION Take 1 tablet by mouth daily. Super Joint Support Supplement    [provider]  pantoprazole (PROTONIX) 40 MG tablet TAKE 1 TABLET BY MOUTH EVERY DAY 01/05/20   Hali Marry, MD  PARoxetine (PAXIL-CR) 12.5 MG 24 hr tablet Take 25 mg by mouth 2 (two) times daily.     [provider]  triamcinolone cream (KENALOG) 0.1 % Apply 1 application topically 2 (two) times daily. To the closed itchy bumps on the skin 10/22/19   Hali Marry, MD    Family History Family History  Problem Relation Age of Onset  . Heart disease Father   . Colon cancer Mother   . Liver cancer Brother   . Asthma Brother     Social History Social History   Tobacco Use  . Smoking status: Former Smoker    Packs/day: 0.30    Years: 2.00    Pack years: 0.60    Types: Cigarettes    Quit date: 02/12/1958    Years since quitting: 62.0  . Smokeless tobacco: Never Used  Vaping Use  . Vaping Use: Never used  Substance Use Topics  . Alcohol use: Yes    Alcohol/week: 1.0 standard drink    Types: 1 Glasses of wine per week    Comment: wine socially  . Drug use: No     Allergies   Astelin [azelastine hcl], Celebrex [celecoxib], Lisinopril, Methotrexate derivatives, Morphine, and Moxifloxacin   Review of Systems Review of Systems  Constitutional: Negative for chills and fever.  HENT: Positive for congestion, postnasal drip and sore throat. Negative for ear pain, sinus pressure, trouble swallowing and voice change.   Respiratory: Negative for cough and shortness of breath.   Cardiovascular: Negative for chest pain and palpitations.  Gastrointestinal: Negative for abdominal pain, diarrhea, nausea and vomiting.  Musculoskeletal: Negative for arthralgias, back pain and myalgias.  Skin: Negative for rash.  All other systems reviewed and are negative.    Physical Exam Triage Vital Signs ED Triage Vitals  Enc Vitals Group     BP 01/28/20  1547 126/78     Pulse Rate 01/28/20 1547 89     Resp 01/28/20 1547 20     Temp 01/28/20 1547 99.2 F (37.3 C)     Temp Source 01/28/20 1547 Oral  SpO2 01/28/20 1547 97 %     Weight 01/28/20 1555 183 lb (83 kg)     Height 01/28/20 1555 5\' 2"  (1.575 m)     Head Circumference --      Peak Flow --      Pain Score 01/28/20 1554 5     Pain Loc --      Pain Edu? --      Excl. in [place]? --    No data found.  Updated Vital Signs BP 126/78 (BP Location: Right Arm)   Pulse 89   Temp 99.2 F (37.3 C) (Oral)   Resp 20   Ht 5\' 2"  (1.575 m)   Wt 183 lb (83 kg)   SpO2 97%   BMI 33.47 kg/m   Visual Acuity Right Eye Distance:   Left Eye Distance:   Bilateral Distance:    Right Eye Near:   Left Eye Near:    Bilateral Near:     Physical Exam Vitals and nursing note reviewed.  Constitutional:      General: She is not in acute distress.    Appearance: She is well-developed and well-nourished. She is not ill-appearing, toxic-appearing or diaphoretic.  HENT:     Head: Normocephalic and atraumatic.     Right Ear: Tympanic membrane and ear canal normal.     Left Ear: Tympanic membrane and ear canal normal.     Nose: Nose normal.     Right Sinus: No maxillary sinus tenderness or frontal sinus tenderness.     Left Sinus: No maxillary sinus tenderness or frontal sinus tenderness.     Mouth/Throat:     Lips: Pink.     Mouth: Mucous membranes are moist.     Pharynx: Oropharynx is clear. Uvula midline. Posterior oropharyngeal erythema present. No pharyngeal swelling, oropharyngeal exudate or uvula swelling.  Eyes:     Extraocular Movements: EOM normal.  Cardiovascular:     Rate and Rhythm: Normal rate and regular rhythm.  Pulmonary:     Effort: Pulmonary effort is normal. No respiratory distress.     Breath sounds: Normal breath sounds. No stridor. No wheezing, rhonchi or rales.  Musculoskeletal:        General: Normal range of motion.     Cervical back: Normal range of motion and  neck supple.  Lymphadenopathy:     Cervical: No cervical adenopathy.  Skin:    General: Skin is warm and dry.  Neurological:     Mental Status: She is alert and oriented to person, place, and time.  Psychiatric:        Mood and Affect: Mood and affect normal.        Behavior: Behavior normal.      UC Treatments / Results  Labs (all labs ordered are listed, but only abnormal results are displayed) Labs Reviewed  COVID-19, FLU A+B NAA  STREP A DNA PROBE  POCT RAPID STREP A (OFFICE)    EKG   Radiology No results found.  Procedures Procedures (including critical care time)  Medications Ordered in UC Medications - No data to display  Initial Impression / Assessment and Plan / UC Course  I have reviewed the triage vital signs and the nursing notes.  Pertinent labs & imaging results that were available during my care of the patient were reviewed by me and considered in my medical decision making (see chart for details).     Rapid strep: NEGATIVE Culture sent COVID/Flu test pending F/u with PCP next week as  needed AVS given   Final Clinical Impressions(s) / UC Diagnoses   Final diagnoses:  Acute pharyngitis, unspecified etiology  Generalized body aches     Discharge Instructions      You may take 500mg  acetaminophen every 4-6 hours or in combination with ibuprofen 400mg  every 6-8 hours as needed for pain, inflammation, and fever.  Be sure to well hydrated with clear liquids and get at least 8 hours of sleep at night, preferably more while sick.   Please follow up with family medicine in 1 week if needed.     ED Prescriptions    None     PDMP not reviewed this encounter.   Noe Gens, PA-C 01/28/20 1640

## 2020-01-28 NOTE — ED Triage Notes (Addendum)
Negative home covid test on Tuesday because she felt like something is wrong Sore throat last night Would like a strep test Tympanic temp at home was 100.8 COVID vaccine 3/21 Pt is not going to get a booster shot due to side effects with a skin breakout

## 2020-01-29 LAB — STREP A DNA PROBE: Group A Strep Probe: NOT DETECTED

## 2020-01-30 LAB — COVID-19, FLU A+B NAA
Influenza A, NAA: NOT DETECTED
Influenza B, NAA: NOT DETECTED
SARS-CoV-2, NAA: NOT DETECTED

## 2020-02-02 ENCOUNTER — Other Ambulatory Visit: Payer: Self-pay

## 2020-02-02 ENCOUNTER — Ambulatory Visit (INDEPENDENT_AMBULATORY_CARE_PROVIDER_SITE_OTHER): Payer: Medicare Other | Admitting: Physician Assistant

## 2020-02-02 ENCOUNTER — Encounter: Payer: Self-pay | Admitting: Physician Assistant

## 2020-02-02 VITALS — BP 131/71 | HR 77 | Temp 98.5°F | Ht 62.0 in | Wt 184.0 lb

## 2020-02-02 DIAGNOSIS — J0100 Acute maxillary sinusitis, unspecified: Secondary | ICD-10-CM | POA: Diagnosis not present

## 2020-02-02 MED ORDER — PREDNISONE 50 MG PO TABS: 50.0000 mg | ORAL_TABLET | Freq: Every day | ORAL | 0 refills | Status: DC

## 2020-02-02 MED ORDER — AMOXICILLIN-POT CLAVULANATE 875-125 MG PO TABS: 1.0000 | ORAL_TABLET | Freq: Two times a day (BID) | ORAL | 0 refills | Status: DC

## 2020-02-02 NOTE — Patient Instructions (Signed)

## 2020-02-02 NOTE — Progress Notes (Signed)
Subjective:    Patient ID: Jenene Slicker, female    DOB: Dec 04, 1945, 74 y.o.   MRN: [id number]  HPI  Pt is a 74 yo female with asthma who presents to the clinic with a little over a week of sinus pressure, ST, congestion, headache, cough and sinus drainage. She went to UC last Friday and was negative for flu, covid, strep. She has been taking OTC mucinex and cough syrup. She does not feel better and feels more congested. She took 2 more covid test that were negative. She has been vaccinated. She denies any GI symptoms, body aches, loss of smell or taste. She is a little winded but no significant SOB.   Marland Kitchen. Active Ambulatory Problems    Diagnosis Date Noted  . Hypothyroidism 09/24/2006  . HYPERLIPIDEMIA 09/23/2006  . DEPRESSION 09/24/2006  . VERTIGO, BENIGN PAROXYSMAL POSITION 09/24/2006  . Allergic rhinitis 11/05/2006  . VOCAL CORD DISORDER 11/05/2006  . Asthma with allergic rhinitis 11/05/2006  . GERD 09/24/2006  . Rheumatoid arthritis with rheumatoid factor ([place]) 11/30/2008  . LOC OSTEOARTHROS NOT SPEC PRIM/SEC LOWER LEG 03/19/2007  . SOMNOLENCE 11/25/2009  . POSTMENOPAUSAL STATUS 11/30/2008  . Abnormal liver enzymes 10/25/2010  . CARRIER/SUSPECTED CARRIER GROUP B STREPTOCOCCUS 11/17/2010  . Obesity (BMI 30-39.9) 05/03/2011  . Abnormal mammogram with microcalcification 10/04/2011  . Atypical ductal hyperplasia of breast 10/29/2011  . OCD (obsessive compulsive disorder) 05/09/2012  . Oral herpes 06/23/2012  . Vitreous detachment 09/23/2012  . IFG (impaired fasting glucose) 01/25/2014  . Essential hypertension, benign 05/26/2014  . Primary osteoarthritis of right hip 05/27/2014  . S/P right THA, AA 08/10/2014  . Venous stasis ulcer of left lower extremity ([place]) 03/03/2015  . Tendon nodule 01/16/2016  . Fatty liver 11/09/2016  . Vitamin D deficiency 06/19/2016  . Chronic fatigue 06/19/2016  . Alopecia 06/19/2016  . Aortic atherosclerosis ([place]) 09/26/2017  . S/P left TKA  08/26/2018  . Overweight (BMI 25.0-29.9) 08/27/2018  . Pain in right knee 01/03/2018  . Lumbar degenerative disc disease 11/07/2018  . Cervical pain 11/07/2018  . Drug-induced constipation 05/25/2019  . Diarrhea 05/25/2019  . Lower abdominal pain 05/25/2019  . Blood in stool 05/25/2019  . Internal hemorrhoids 05/25/2019  . Family history of colon cancer 05/25/2019  . Acute cystitis 09/09/2019  . Right renal stone 09/21/2019  . Bullous pemphigoid 11/06/2019  . Educated about COVID-19 virus infection 11/09/2019  . Type 2 diabetes mellitus with complication, without long-term current use of insulin ([place]) 11/09/2019  . Occipital pain 12/28/2019  . Numbness and tingling sensation of skin 12/29/2019  . Spondylosis without myelopathy or radiculopathy, cervical region 12/29/2019   Resolved Ambulatory Problems    Diagnosis Date Noted  . EUSTACHIAN TUBE DYSFUNCTION, LEFT 04/13/2009  . Acute maxillary sinusitis 01/19/2009  . PEPTIC ULCER DISEASE 11/05/2006  . ARTHRITIS, HANDS, BILATERAL 12/22/2007  . Precordial pain 06/23/2008  . Chest pain 10/25/2010  . Dyspnea 10/25/2010  . Dyspnea 06/26/2013  . ACE-inhibitor cough 05/26/2014  . Preoperative clearance 08/03/2014  . Acute sinusitis 04/12/2015  . Pain of left hand 05/22/2017   Past Medical History:  Diagnosis Date  . Allergic rhinitis, cause unspecified   . Anxiety   . Cancer ([place])   . CHF (congestive heart failure) ([place]) 11/09/2016  . Complication of anesthesia   . History of hiatal hernia   . Hypertension   . Pneumonia   . Pre-diabetes   . TMJ (temporomandibular joint disorder)   . Unspecified asthma(493.90)   . Unspecified hypothyroidism  Review of Systems  All other systems reviewed and are negative.      Objective:   Physical Exam        Assessment & Plan:  Marland KitchenMarland KitchenCady was seen today for nasal congestion.  Diagnoses and all orders for this visit:  Acute non-recurrent maxillary sinusitis -      amoxicillin-clavulanate (AUGMENTIN) 875-125 MG tablet; Take 1 tablet by mouth 2 (two) times daily for 10 days. -     predniSONE (DELTASONE) 50 MG tablet; Take 1 tablet (50 mg total) by mouth daily.   Sounds like virus that is turning into bacterial sinusitis. symptoms for over a week. Negative for strep/flu/covid. Treated with prednisone and augmentin. Rest and hydration. Ok to use flonase. Follow up as needed or if symptoms worsen.

## 2020-02-09 ENCOUNTER — Other Ambulatory Visit: Payer: Self-pay

## 2020-02-09 ENCOUNTER — Ambulatory Visit (INDEPENDENT_AMBULATORY_CARE_PROVIDER_SITE_OTHER): Payer: Medicare Other | Admitting: Family Medicine

## 2020-02-09 ENCOUNTER — Encounter: Payer: Self-pay | Admitting: Family Medicine

## 2020-02-09 VITALS — BP 119/60 | HR 90 | Temp 99.1°F | Ht 62.0 in | Wt 186.0 lb

## 2020-02-09 DIAGNOSIS — J069 Acute upper respiratory infection, unspecified: Secondary | ICD-10-CM | POA: Diagnosis not present

## 2020-02-09 DIAGNOSIS — E611 Iron deficiency: Secondary | ICD-10-CM | POA: Diagnosis not present

## 2020-02-09 DIAGNOSIS — J040 Acute laryngitis: Secondary | ICD-10-CM | POA: Diagnosis not present

## 2020-02-09 MED ORDER — NYSTATIN 100000 UNIT/ML MT SUSP: 5.0000 mL | Freq: Four times a day (QID) | OROMUCOSAL | 0 refills | Status: DC

## 2020-02-09 NOTE — Progress Notes (Signed)
Acute Office Visit  Subjective:    Patient ID: [name], female    DOB: 05-17-1945, 74 y.o.   MRN: [id number]  Chief Complaint  Patient presents with  . Sinusitis    HPI Patient is in today for still not feeling well.  She was initially seen in the urgent care for acute pharyngitis as well as generalized body aches.  She tested negative for Covid, flu and strep at that time.  She continued to not do well and developed significant sinus congestion, headache and cough.  She says overall the cough is much better she does not feel like she has a lot of chest symptoms.  The sinus congestion is better as well she still has hoarseness in her voice and says if she talks a lot her throat will feel little bit sore.  She still has about a day and a half left on the Augmentin that she was prescribed on December 21.  Though it sounds like she waited several days to actually start the medication she reports that she did have a low-grade fever last night.  Did test for Covid several times in between.  That she was really busy the week of Christmas and so just feels a little extra tired.  She denies any shortness of breath or chest symptoms today.  Past Medical History:  Diagnosis Date  . Allergic rhinitis, cause unspecified   . Anxiety   . Benign paroxysmal positional vertigo   . Cancer (HCC)    basal cell carcinoma per right side of nostril  . CHF (congestive heart failure) (HCC) 11/09/2016   had 2+ edema in legs with anxiety and SOB  . Complication of anesthesia    has 4  degenerative discs in neck.  . Depressive disorder, not elsewhere classified   . Esophageal reflux   . History of hiatal hernia   . Hypertension   . Localized osteoarthrosis not specified whether primary or secondary, lower leg   . Other and unspecified hyperlipidemia   . Other diseases of vocal cords   . Peptic ulcer, unspecified site, unspecified as acute or chronic, without mention of hemorrhage, perforation, or  obstruction   . Pneumonia    bilat pneumonia 1987  . Pre-diabetes    no meds,just diet controll  . TMJ (temporomandibular joint disorder)   . Unspecified arthropathy, hand   . Unspecified asthma(493.90)    triggered with a virus   . Unspecified hypothyroidism     Past Surgical History:  Procedure Laterality Date  . BREAST BIOPSY  09/07/2011   High Risk Lesion  . BREAST EXCISIONAL BIOPSY Left   . BREAST SURGERY     LUMPECTOMY / LEFT 10/12/2011  . KNEE ARTHROSCOPY     bilat   . NASAL SINUS SURGERY    . ROTATOR CUFF REPAIR     right   . thumb surgery      left hand 45 years ago   . TOTAL HIP ARTHROPLASTY Right 08/10/2014   Procedure: RIGHT TOTAL HIP ARTHROPLASTY ANTERIOR APPROACH;  Surgeon: Durene Romans, MD;  Location: [facility];  Service: Orthopedics;  Laterality: Right;  . TOTAL KNEE ARTHROPLASTY Left 08/26/2018   Procedure: TOTAL KNEE ARTHROPLASTY;  Surgeon: Durene Romans, MD;  Location: [facility];  Service: Orthopedics;  Laterality: Left;  70 mins    Family History  Problem Relation Age of Onset  . Heart disease Father   . Colon cancer Mother   . Liver cancer Brother   . Asthma Brother  Social History   Socioeconomic History  . Marital status: Married    Spouse name: [name]  . Number of children: 3  . Years of education: 16  . Highest education level: Bachelor's degree (e.g., BA, AB, BS)  Occupational History  . Occupation: Retired    Comment: Pharmacist, hospital  Tobacco Use  . Smoking status: Former Smoker    Packs/day: 0.30    Years: 2.00    Pack years: 0.60    Types: Cigarettes    Quit date: 02/12/1958    Years since quitting: 62.0  . Smokeless tobacco: Never Used  Vaping Use  . Vaping Use: Never used  Substance and Sexual Activity  . Alcohol use: Yes    Alcohol/week: 1.0 standard drink    Types: 1 Glasses of wine per week    Comment: wine socially  . Drug use: No  . Sexual activity: Yes  Other Topics Concern  . Not on file  Social History Narrative   Helps  watch grand babys   Social Determinants of Health   Financial Resource Strain: Not on file  Food Insecurity: Not on file  Transportation Needs: Not on file  Physical Activity: Not on file  Stress: Not on file  Social Connections: Not on file  Intimate Partner Violence: Not on file    Outpatient Medications Prior to Visit  Medication Sig Dispense Refill  . albuterol (PROVENTIL HFA) 108 (90 Base) MCG/ACT inhaler Inhale 2 puffs into the lungs every 6 (six) hours as needed for shortness of breath. 6.7 g 5  . Ascorbic Acid (VITAMIN C WITH ROSE HIPS) 1000 MG tablet Take 1,000 mg by mouth daily.    . budesonide-formoterol (SYMBICORT) 80-4.5 MCG/ACT inhaler Inhale 2 puffs into the lungs 2 (two) times daily. 30.6 Inhaler 3  . buPROPion (WELLBUTRIN XL) 150 MG 24 hr tablet Take 300 mg by mouth daily.    . Calcium Carb-Cholecalciferol (CALCIUM 600+D3 PO) Take 1 tablet by mouth 2 (two) times a day.    . Carboxymethylcell-Hypromellose (GENTEAL OP) Place 1-2 drops into both eyes 3 (three) times daily as needed (for dry eyes).    . Cholecalciferol (VITAMIN D3) 50 MCG (2000 UT) capsule Take 200 Units by mouth daily.     . clonazePAM (KLONOPIN) 0.5 MG tablet Take 0.25 mg by mouth 2 (two) times daily as needed for anxiety.    . famotidine (PEPCID) 20 MG tablet Take 20 mg by mouth at bedtime.     . fexofenadine (ALLEGRA) 180 MG tablet Take 180 mg by mouth at bedtime.    . folic acid (FOLVITE) 1 MG tablet Take 1 mg by mouth 2 (two) times daily.    . hydrochlorothiazide (HYDRODIURIL) 25 MG tablet TAKE 1 TABLET BY MOUTH EVERY DAY 90 tablet 2  . levothyroxine (SYNTHROID, LEVOTHROID) 75 MCG tablet Take 75 mcg by mouth See admin instructions. Take 75 mcg by mouth daily on all days except Sunday    . levothyroxine (SYNTHROID, LEVOTHROID) 88 MCG tablet Take 88 mcg by mouth every Sunday.     . Lido-Menthol-Methyl Sal-Camph (CBD KINGS EX) Apply topically.    Marland Kitchen losartan (COZAAR) 50 MG tablet TAKE 1 TABLET BY MOUTH  EVERY DAY 90 tablet 3  . Magnesium 100 MG TABS Take 150 mg by mouth 2 (two) times a day.    . meloxicam (MOBIC) 15 MG tablet TAKE 1 TABLET (15 MG TOTAL) BY MOUTH EVERY MORNING. 90 tablet 1  . Menthol, Topical Analgesic, (BIOFREEZE EX) Apply topically.    Marland Kitchen  metroNIDAZOLE (METROGEL) 1 % gel Apply topically daily. (Patient taking differently: Apply 1 application topically as needed.) 60 g 1  . pantoprazole (PROTONIX) 40 MG tablet TAKE 1 TABLET BY MOUTH EVERY DAY 90 tablet 3  . PARoxetine (PAXIL-CR) 12.5 MG 24 hr tablet Take 25 mg by mouth 2 (two) times daily.    Marland Kitchen amoxicillin-clavulanate (AUGMENTIN) 875-125 MG tablet Take 1 tablet by mouth 2 (two) times daily for 10 days. 20 tablet 0  . predniSONE (DELTASONE) 50 MG tablet Take 1 tablet (50 mg total) by mouth daily. 5 tablet 0   No facility-administered medications prior to visit.    Allergies  Allergen Reactions  . Astelin [Azelastine Hcl] Other (See Comments)    Headache  . Celebrex [Celecoxib] Other (See Comments)    Stomach issues  . Lisinopril Other (See Comments) and Cough    ACE cough  . Methotrexate Derivatives Other (See Comments)  . Morphine Nausea Only  . Moxifloxacin Other (See Comments)    REACTION: confusion, dizziness, paranoia    Review of Systems     Objective:    Physical Exam Constitutional:      Appearance: She is well-developed and well-nourished.  HENT:     Head: Normocephalic and atraumatic.     Right Ear: External ear normal.     Left Ear: External ear normal.     Nose: Nose normal.     Mouth/Throat:     Mouth: Oropharynx is clear and moist.      Comments: TMs and canals are clear. Eyes:     Extraocular Movements: EOM normal.     Conjunctiva/sclera: Conjunctivae normal.     Pupils: Pupils are equal, round, and reactive to light.  Neck:     Thyroid: No thyromegaly.  Cardiovascular:     Rate and Rhythm: Normal rate and regular rhythm.     Heart sounds: Normal heart sounds.  Pulmonary:     Effort:  Pulmonary effort is normal.     Breath sounds: Normal breath sounds. No wheezing.  Musculoskeletal:     Cervical back: Neck supple.  Lymphadenopathy:     Cervical: No cervical adenopathy.  Skin:    General: Skin is warm and dry.  Neurological:     Mental Status: She is alert and oriented to person, place, and time.  Psychiatric:        Mood and Affect: Mood and affect normal.     BP 119/60   Pulse 90   Temp 99.1 F (37.3 C)   Ht 5\' 2"  (1.575 m)   Wt 186 lb (84.4 kg)   SpO2 98%   BMI 34.02 kg/m  Wt Readings from Last 3 Encounters:  02/09/20 186 lb (84.4 kg)  02/02/20 184 lb (83.5 kg)  01/28/20 183 lb (83 kg)    Health Maintenance Due  Topic Date Due  . FOOT EXAM  Never done  . OPHTHALMOLOGY EXAM  Never done  . COVID-19 Vaccine (3 - Pfizer risk 4-dose series) 05/13/2019    There are no preventive care reminders to display for this patient.   Lab Results  Component Value Date   TSH 0.70 10/30/2019   Lab Results  Component Value Date   WBC 9.2 09/16/2019   HGB 11.6 (L) 09/16/2019   HCT 34.9 (L) 09/16/2019   MCV 84.3 09/16/2019   PLT 406 (H) 09/16/2019   Lab Results  Component Value Date   NA 138 10/30/2019   K 4.2 10/30/2019   CO2 27 (A) 10/30/2019  GLUCOSE 112 09/16/2019   BUN 19 10/30/2019   CREATININE 0.7 10/30/2019   BILITOT 0.4 09/16/2019   ALKPHOS 57 08/07/2018   AST 19 10/30/2019   ALT 38 (A) 10/30/2019   PROT 6.2 09/16/2019   ALBUMIN 3.9 10/30/2019   CALCIUM 9.4 10/30/2019   ANIONGAP 9 08/27/2018   Lab Results  Component Value Date   CHOL 199 10/30/2019   Lab Results  Component Value Date   HDL 55 10/30/2019   Lab Results  Component Value Date   LDLCALC 123 10/30/2019   Lab Results  Component Value Date   TRIG 162 (A) 10/30/2019   Lab Results  Component Value Date   CHOLHDL 3.6 CALC 09/18/2006   Lab Results  Component Value Date   HGBA1C 6.2 10/30/2019       Assessment & Plan:   Problem List Items Addressed This  Visit   None   Visit Diagnoses    Low serum iron    -  Primary   Relevant Orders   CBC   Fe+TIBC+Fer   Laryngitis       Viral upper respiratory tract infection       Relevant Medications   nystatin (MYCOSTATIN) 100000 UNIT/ML suspension     Upper respiratory infection-she is slightly getting better she is had symptoms for little over 2 weeks at this point.  Did encourage her to complete the antibiotics that she started she still has about another day and a half.  Also recommend a trial of nystatin swish and swallow for her laryngitis she has been on antibiotics as well as prednisone which does put her at increased risk of developing thrush.  Does like she is getting worse, develops increased fever, or does not improve over the next several days then please let us know.  Laryngitis-if it does not improve with nystatin swish and swallow over the next week or 2 then please let us know and will refer to ENT for further work-up.  Low serum iron-she never went back to the lab in August to have her levels rechecked.  We will do that today.  Meds ordered this encounter  Medications  . nystatin (MYCOSTATIN) 100000 UNIT/ML suspension    Sig: Take 5 mLs (500,000 Units total) by mouth 4 (four) times daily. X 1 week. Swish and hold in mouth for at least 2 minutes and then swallow. X 5 days    Dispense:  473 mL    Refill:  0     Beatrice Lecher, MD

## 2020-02-15 ENCOUNTER — Telehealth (INDEPENDENT_AMBULATORY_CARE_PROVIDER_SITE_OTHER): Payer: Medicare Other | Admitting: Physician Assistant

## 2020-02-15 VITALS — Temp 101.9°F

## 2020-02-15 DIAGNOSIS — E611 Iron deficiency: Secondary | ICD-10-CM | POA: Diagnosis not present

## 2020-02-15 DIAGNOSIS — J069 Acute upper respiratory infection, unspecified: Secondary | ICD-10-CM

## 2020-02-15 DIAGNOSIS — J040 Acute laryngitis: Secondary | ICD-10-CM

## 2020-02-15 DIAGNOSIS — J111 Influenza due to unidentified influenza virus with other respiratory manifestations: Secondary | ICD-10-CM

## 2020-02-15 DIAGNOSIS — R509 Fever, unspecified: Secondary | ICD-10-CM | POA: Diagnosis not present

## 2020-02-15 MED ORDER — OSELTAMIVIR PHOSPHATE 75 MG PO CAPS: 75.0000 mg | ORAL_CAPSULE | Freq: Two times a day (BID) | ORAL | 0 refills | Status: DC

## 2020-02-15 NOTE — Progress Notes (Signed)
Patient ID: [name], female   DOB: 1945-07-20, 75 y.o.   MRN: [id number]  .Marland KitchenVirtual Visit via Telephone Note  I connected with [name] on 02/15/2020 at  4:00 PM EST by telephone and verified that I am speaking with the correct person using two identifiers.  Location: Patient: home Provider: home  .Marland KitchenParticipating in visit:  Patient: [name] Provider: Tandy Gaw PA-C   I discussed the limitations, risks, security and privacy concerns of performing an evaluation and management service by telephone and the availability of in person appointments. I also discussed with the patient that there may be a patient responsible charge related to this service. The patient expressed understanding and agreed to proceed.   History of Present Illness; Pt is a 75 yo female with asthma who presents to the clinic with over a month of not feeling well but over the past 3 days a new fever. At its highest 101.9. she is taking motrin and tylenol which do help. She is weak and feels achy all over. She just finished augmentin and prednisone which she did feel better and was doing well until christmas. As soon as she started doing well her husband got sick. Apparently something different and then she got sick again. She still has hoarse voice. She has her covid and flu vaccine. She has had 5 negative covid test over the past 2 weeks. She wonders about her urine. She has had UTI at one point. No urine odor or symptoms. She would like tested.   .. Active Ambulatory Problems    Diagnosis Date Noted  . Hypothyroidism 09/24/2006  . HYPERLIPIDEMIA 09/23/2006  . DEPRESSION 09/24/2006  . VERTIGO, BENIGN PAROXYSMAL POSITION 09/24/2006  . Allergic rhinitis 11/05/2006  . VOCAL CORD DISORDER 11/05/2006  . Asthma with allergic rhinitis 11/05/2006  . GERD 09/24/2006  . Rheumatoid arthritis with rheumatoid factor (HCC) 11/30/2008  . LOC OSTEOARTHROS NOT SPEC PRIM/SEC LOWER LEG 03/19/2007  . SOMNOLENCE  11/25/2009  . POSTMENOPAUSAL STATUS 11/30/2008  . Abnormal liver enzymes 10/25/2010  . CARRIER/SUSPECTED CARRIER GROUP B STREPTOCOCCUS 11/17/2010  . Obesity (BMI 30-39.9) 05/03/2011  . Abnormal mammogram with microcalcification 10/04/2011  . Atypical ductal hyperplasia of breast 10/29/2011  . OCD (obsessive compulsive disorder) 05/09/2012  . Oral herpes 06/23/2012  . Vitreous detachment 09/23/2012  . IFG (impaired fasting glucose) 01/25/2014  . Essential hypertension, benign 05/26/2014  . Primary osteoarthritis of right hip 05/27/2014  . S/P right THA, AA 08/10/2014  . Venous stasis ulcer of left lower extremity (HCC) 03/03/2015  . Tendon nodule 01/16/2016  . Fatty liver 11/09/2016  . Vitamin D deficiency 06/19/2016  . Chronic fatigue 06/19/2016  . Alopecia 06/19/2016  . Aortic atherosclerosis (HCC) 09/26/2017  . S/P left TKA 08/26/2018  . Overweight (BMI 25.0-29.9) 08/27/2018  . Pain in right knee 01/03/2018  . Lumbar degenerative disc disease 11/07/2018  . Cervical pain 11/07/2018  . Drug-induced constipation 05/25/2019  . Diarrhea 05/25/2019  . Lower abdominal pain 05/25/2019  . Blood in stool 05/25/2019  . Internal hemorrhoids 05/25/2019  . Family history of colon cancer 05/25/2019  . Acute cystitis 09/09/2019  . Right renal stone 09/21/2019  . Bullous pemphigoid 11/06/2019  . Educated about COVID-19 virus infection 11/09/2019  . Type 2 diabetes mellitus with complication, without long-term current use of insulin (HCC) 11/09/2019  . Occipital pain 12/28/2019  . Numbness and tingling sensation of skin 12/29/2019  . Spondylosis without myelopathy or radiculopathy, cervical region 12/29/2019   Resolved Ambulatory Problems  Diagnosis Date Noted  . EUSTACHIAN TUBE DYSFUNCTION, LEFT 04/13/2009  . Acute maxillary sinusitis 01/19/2009  . PEPTIC ULCER DISEASE 11/05/2006  . ARTHRITIS, HANDS, BILATERAL 12/22/2007  . Precordial pain 06/23/2008  . Chest pain 10/25/2010  .  Dyspnea 10/25/2010  . Dyspnea 06/26/2013  . ACE-inhibitor cough 05/26/2014  . Preoperative clearance 08/03/2014  . Acute sinusitis 04/12/2015  . Pain of left hand 05/22/2017   Past Medical History:  Diagnosis Date  . Allergic rhinitis, cause unspecified   . Anxiety   . Cancer ([place])   . CHF (congestive heart failure) ([place]) 11/09/2016  . Complication of anesthesia   . History of hiatal hernia   . Hypertension   . Pneumonia   . Pre-diabetes   . TMJ (temporomandibular joint disorder)   . Unspecified asthma(493.90)   . Unspecified hypothyroidism    Reviewed med, allergy, problem list.      Observations/Objective: No acute distress Normal breathing and able to talk in sentences Dry cough  .Marland Kitchen Today's Vitals   02/15/20 1615  Temp: (!) 101.9 F (38.8 C)  TempSrc: Oral   There is no height or weight on file to calculate BMI.    Assessment and Plan: Marland KitchenMarland KitchenDiagnoses and all orders for this visit:  Fever and chills -     Urine Culture -     Urinalysis, microscopic only  Viral upper respiratory tract infection  Influenza-like illness -     oseltamivir (TAMIFLU) 75 MG capsule; Take 1 capsule (75 mg total) by mouth 2 (two) times daily.  Laryngitis   Symptoms do sound viral in nature. Unfortunately patient has had back to back viruses. She has had 5 negative covid test in the last 2 weeks. Her fever is new. Would like to go ahead and get a urine sample on her. Clinic is not able to test for flu right now. Will treat empirically with tamiflu. Symptomatic care given and encouraged to rest and hydrate. Pt just finished antibiotic and prednisone. I would like to avoid any further abx or prednisone treatment at this time. Call with any new symptoms.   Follow Up Instructions:    I discussed the assessment and treatment plan with the patient. The patient was provided an opportunity to ask questions and all were answered. The patient agreed with the plan and demonstrated an  understanding of the instructions.   The patient was advised to call back or seek an in-person evaluation if the symptoms worsen or if the condition fails to improve as anticipated.  I provided 20 minutes of non-face-to-face time during this encounter.   Iran Planas, PA-C

## 2020-02-16 ENCOUNTER — Telehealth: Payer: Self-pay

## 2020-02-16 ENCOUNTER — Encounter: Payer: Self-pay | Admitting: Physician Assistant

## 2020-02-16 LAB — CBC
HCT: 38 % (ref 35.0–45.0)
Hemoglobin: 12.9 g/dL (ref 11.7–15.5)
MCH: 28.9 pg (ref 27.0–33.0)
MCHC: 33.9 g/dL (ref 32.0–36.0)
MCV: 85 fL (ref 80.0–100.0)
MPV: 11.3 fL (ref 7.5–12.5)
Platelets: 319 10*3/uL (ref 140–400)
RBC: 4.47 10*6/uL (ref 3.80–5.10)
RDW: 12.8 % (ref 11.0–15.0)
WBC: 6.5 10*3/uL (ref 3.8–10.8)

## 2020-02-16 LAB — IRON,TIBC AND FERRITIN PANEL
%SAT: 8 % (calc) — ABNORMAL LOW (ref 16–45)
Ferritin: 111 ng/mL (ref 16–288)
Iron: 23 ug/dL — ABNORMAL LOW (ref 45–160)
TIBC: 302 mcg/dL (calc) (ref 250–450)

## 2020-02-16 NOTE — Progress Notes (Signed)
[place]eb[place],   No bacteria seen in urine. How are you feeling this morning?

## 2020-02-16 NOTE — Telephone Encounter (Signed)
[name] called and left a message requesting more information about her recent labs. She is concerned about the iron studies. Please advise.

## 2020-02-16 NOTE — Telephone Encounter (Signed)
[name] called and left a message stating she was to call back and report how she was feeling. She stated she still has a fever of 100.7-101.9 and feels about the same.

## 2020-02-17 LAB — URINE CULTURE
MICRO NUMBER:: 11377444
SPECIMEN QUALITY:: ADEQUATE

## 2020-02-17 LAB — URINALYSIS, MICROSCOPIC ONLY
Bacteria, UA: NONE SEEN /HPF
Hyaline Cast: NONE SEEN /LPF
RBC / HPF: NONE SEEN /HPF (ref 0–2)
Squamous Epithelial / HPF: NONE SEEN /HPF (ref <=5)

## 2020-02-17 MED ORDER — NITROFURANTOIN MONOHYD MACRO 100 MG PO CAPS: 100.0000 mg | ORAL_CAPSULE | Freq: Two times a day (BID) | ORAL | 0 refills | Status: DC

## 2020-02-17 NOTE — Progress Notes (Signed)
You do have UTI. E.coli found. I will send over macrobid.

## 2020-02-17 NOTE — Telephone Encounter (Signed)
Keep updated with any new symptoms or worsening breathing or chest pain.

## 2020-02-17 NOTE — Addendum Note (Signed)
Addended byJomarie Longs on: 02/17/2020 04:00 PM   Modules accepted: Orders

## 2020-02-17 NOTE — Telephone Encounter (Signed)
Spoke with patient and patient is aware to call us if her symptoms worsen.

## 2020-02-17 NOTE — Progress Notes (Signed)
She can stop tamiflu and just take macrobid.

## 2020-02-19 ENCOUNTER — Telehealth: Payer: Self-pay

## 2020-02-19 MED ORDER — DIPHENOXYLATE-ATROPINE 2.5-0.025 MG PO TABS: ORAL_TABLET | ORAL | 0 refills | Status: DC

## 2020-02-19 NOTE — Telephone Encounter (Signed)
Sent lomotil for diarrhea. Ok to take OTC probiotics.

## 2020-02-19 NOTE — Telephone Encounter (Signed)
Pt called stating the medication she taking is giving her stomach cramps and diarrhea. Would you like to change her medication or something for diarrhea.

## 2020-03-03 ENCOUNTER — Ambulatory Visit (INDEPENDENT_AMBULATORY_CARE_PROVIDER_SITE_OTHER): Payer: Medicare Other | Admitting: Family Medicine

## 2020-03-03 ENCOUNTER — Other Ambulatory Visit: Payer: Self-pay

## 2020-03-03 ENCOUNTER — Encounter: Payer: Self-pay | Admitting: Family Medicine

## 2020-03-03 VITALS — BP 122/60 | HR 71 | Ht 62.0 in | Wt 184.0 lb

## 2020-03-03 DIAGNOSIS — Z87440 Personal history of urinary (tract) infections: Secondary | ICD-10-CM | POA: Diagnosis not present

## 2020-03-03 DIAGNOSIS — R21 Rash and other nonspecific skin eruption: Secondary | ICD-10-CM

## 2020-03-03 DIAGNOSIS — N390 Urinary tract infection, site not specified: Secondary | ICD-10-CM

## 2020-03-03 LAB — POCT URINALYSIS DIP (CLINITEK)
Bilirubin, UA: NEGATIVE
Blood, UA: NEGATIVE
Glucose, UA: NEGATIVE mg/dL
Nitrite, UA: NEGATIVE
POC PROTEIN,UA: NEGATIVE
Spec Grav, UA: 1.02 (ref 1.010–1.025)
Urobilinogen, UA: 0.2 E.U./dL
pH, UA: 7 (ref 5.0–8.0)

## 2020-03-03 MED ORDER — PREMARIN 0.625 MG/GM VA CREA: TOPICAL_CREAM | VAGINAL | 2 refills | Status: DC

## 2020-03-03 NOTE — Addendum Note (Signed)
Addended by: Teddy Spike on: 03/03/2020 03:31 PM   Modules accepted: Orders

## 2020-03-03 NOTE — Progress Notes (Signed)
Established Patient Office Visit  Subjective:  Patient ID: [name], female    DOB: Jan 23, 1946  Age: 75 y.o. MRN: [id number]  CC:  Chief Complaint  Patient presents with  . Follow-up  . Elbow abrasion    Right elbow     HPI [name] presents to discuss recurring UTIs.  She had one in April, then in July and has done well since then up until recently when she had another 1. Her symptoms have resolved.  Today for follow-up just to make sure that everything has cleared up.  She says before this year and the 3 infections she has had this year she has not had one since 2016.  She does feel like at least 1 or 2 the UTIs were after intercourse.  She also has a red rash on her right elbow that is been there for quite some time at least since the summer she tends to sit upright to sleep for her reflux and for her neck and puts a lot of pressure on her right elbow she feels like it almost looks like a bedsore to her.  She is tried moisturizing it really well using antibacterial ointments and topical over-the-counter cortisone treatments and it just does not ever seem to really go away.  Past Medical History:  Diagnosis Date  . Allergic rhinitis, cause unspecified   . Anxiety   . Benign paroxysmal positional vertigo   . Cancer ([place])    basal cell carcinoma per right side of nostril  . CHF (congestive heart failure) ([place]) 11/09/2016   had 2+ edema in legs with anxiety and SOB  . Complication of anesthesia    has 4  degenerative discs in neck.  . Depressive disorder, not elsewhere classified   . Esophageal reflux   . History of hiatal hernia   . Hypertension   . Localized osteoarthrosis not specified whether primary or secondary, lower leg   . Other and unspecified hyperlipidemia   . Other diseases of vocal cords   . Peptic ulcer, unspecified site, unspecified as acute or chronic, without mention of hemorrhage, perforation, or obstruction   . Pneumonia    bilat pneumonia  1987  . Pre-diabetes    no meds,just diet controll  . TMJ (temporomandibular joint disorder)   . Unspecified arthropathy, hand   . Unspecified asthma(493.90)    triggered with a virus   . Unspecified hypothyroidism     Past Surgical History:  Procedure Laterality Date  . BREAST BIOPSY  09/07/2011   High Risk Lesion  . BREAST EXCISIONAL BIOPSY Left   . BREAST SURGERY     LUMPECTOMY / LEFT 10/12/2011  . KNEE ARTHROSCOPY     bilat   . NASAL SINUS SURGERY    . ROTATOR CUFF REPAIR     right   . thumb surgery      left hand 45 years ago   . TOTAL HIP ARTHROPLASTY Right 08/10/2014   Procedure: RIGHT TOTAL HIP ARTHROPLASTY ANTERIOR APPROACH;  Surgeon: Paralee Cancel, MD;  Location: [facility];  Service: Orthopedics;  Laterality: Right;  . TOTAL KNEE ARTHROPLASTY Left 08/26/2018   Procedure: TOTAL KNEE ARTHROPLASTY;  Surgeon: Paralee Cancel, MD;  Location: [facility];  Service: Orthopedics;  Laterality: Left;  70 mins    Family History  Problem Relation Age of Onset  . Heart disease Father   . Colon cancer Mother   . Liver cancer Brother   . Asthma Brother     Social History  Socioeconomic History  . Marital status: Married    Spouse name: [name]  . Number of children: 3  . Years of education: 16  . Highest education level: Bachelor's degree (e.g., BA, AB, BS)  Occupational History  . Occupation: Retired    Comment: Pharmacist, hospital  Tobacco Use  . Smoking status: Former Smoker    Packs/day: 0.30    Years: 2.00    Pack years: 0.60    Types: Cigarettes    Quit date: 02/12/1958    Years since quitting: 62.0  . Smokeless tobacco: Never Used  Vaping Use  . Vaping Use: Never used  Substance and Sexual Activity  . Alcohol use: Yes    Alcohol/week: 1.0 standard drink    Types: 1 Glasses of wine per week    Comment: wine socially  . Drug use: No  . Sexual activity: Yes  Other Topics Concern  . Not on file  Social History Narrative   Helps watch grand babys   Social Determinants of  Health   Financial Resource Strain: Not on file  Food Insecurity: Not on file  Transportation Needs: Not on file  Physical Activity: Not on file  Stress: Not on file  Social Connections: Not on file  Intimate Partner Violence: Not on file    Outpatient Medications Prior to Visit  Medication Sig Dispense Refill  . albuterol (PROVENTIL HFA) 108 (90 Base) MCG/ACT inhaler Inhale 2 puffs into the lungs every 6 (six) hours as needed for shortness of breath. 6.7 g 5  . Ascorbic Acid (VITAMIN C WITH ROSE HIPS) 1000 MG tablet Take 1,000 mg by mouth daily.    . budesonide-formoterol (SYMBICORT) 80-4.5 MCG/ACT inhaler Inhale 2 puffs into the lungs 2 (two) times daily. 30.6 Inhaler 3  . buPROPion (WELLBUTRIN XL) 150 MG 24 hr tablet Take 300 mg by mouth daily.    . Calcium Carb-Cholecalciferol (CALCIUM 600+D3 PO) Take 1 tablet by mouth 2 (two) times a day.    . Carboxymethylcell-Hypromellose (GENTEAL OP) Place 1-2 drops into both eyes 3 (three) times daily as needed (for dry eyes).    . Cholecalciferol (VITAMIN D3) 50 MCG (2000 UT) capsule Take 200 Units by mouth daily.     . clonazePAM (KLONOPIN) 0.5 MG tablet Take 0.25 mg by mouth 2 (two) times daily as needed for anxiety.    . diphenoxylate-atropine (LOMOTIL) 2.5-0.025 MG tablet One to 2 tablets by mouth 4 times a day as needed for diarrhea. 30 tablet 0  . famotidine (PEPCID) 20 MG tablet Take 20 mg by mouth at bedtime.     . fexofenadine (ALLEGRA) 180 MG tablet Take 180 mg by mouth at bedtime.    . folic acid (FOLVITE) 1 MG tablet Take 1 mg by mouth 2 (two) times daily.    . hydrochlorothiazide (HYDRODIURIL) 25 MG tablet TAKE 1 TABLET BY MOUTH EVERY DAY 90 tablet 2  . levothyroxine (SYNTHROID, LEVOTHROID) 75 MCG tablet Take 75 mcg by mouth See admin instructions. Take 75 mcg by mouth daily on all days except Sunday    . levothyroxine (SYNTHROID, LEVOTHROID) 88 MCG tablet Take 88 mcg by mouth every Sunday.     . Lido-Menthol-Methyl Sal-Camph (CBD  KINGS EX) Apply topically.    Marland Kitchen losartan (COZAAR) 50 MG tablet TAKE 1 TABLET BY MOUTH EVERY DAY 90 tablet 3  . Magnesium 100 MG TABS Take 150 mg by mouth 2 (two) times a day.    . meloxicam (MOBIC) 15 MG tablet TAKE 1 TABLET (15 MG  TOTAL) BY MOUTH EVERY MORNING. 90 tablet 1  . Menthol, Topical Analgesic, (BIOFREEZE EX) Apply topically.    . metroNIDAZOLE (METROGEL) 1 % gel Apply topically daily. (Patient taking differently: Apply 1 application topically as needed.) 60 g 1  . pantoprazole (PROTONIX) 40 MG tablet TAKE 1 TABLET BY MOUTH EVERY DAY 90 tablet 3  . PARoxetine (PAXIL-CR) 12.5 MG 24 hr tablet Take 25 mg by mouth 2 (two) times daily.    . nitrofurantoin, macrocrystal-monohydrate, (MACROBID) 100 MG capsule Take 1 capsule (100 mg total) by mouth 2 (two) times daily. 14 capsule 0  . nystatin (MYCOSTATIN) 100000 UNIT/ML suspension Take 5 mLs (500,000 Units total) by mouth 4 (four) times daily. X 1 week. Swish and hold in mouth for at least 2 minutes and then swallow. X 5 days 473 mL 0  . oseltamivir (TAMIFLU) 75 MG capsule Take 1 capsule (75 mg total) by mouth 2 (two) times daily. 10 capsule 0   No facility-administered medications prior to visit.    Allergies  Allergen Reactions  . Astelin [Azelastine Hcl] Other (See Comments)    Headache  . Celebrex [Celecoxib] Other (See Comments)    Stomach issues  . Lisinopril Other (See Comments) and Cough    ACE cough  . Methotrexate Derivatives Other (See Comments)  . Morphine Nausea Only  . Moxifloxacin Other (See Comments)    REACTION: confusion, dizziness, paranoia    ROS Review of Systems    Objective:    Physical Exam  BP 122/60   Pulse 71   Ht 5\' 2"  (1.575 m)   Wt 184 lb (83.5 kg)   SpO2 98%   BMI 33.65 kg/m  Wt Readings from Last 3 Encounters:  03/03/20 184 lb (83.5 kg)  02/09/20 186 lb (84.4 kg)  02/02/20 184 lb (83.5 kg)     Health Maintenance Due  Topic Date Due  . FOOT EXAM  Never done  . OPHTHALMOLOGY EXAM   Never done  . COVID-19 Vaccine (3 - Pfizer risk 4-dose series) 05/13/2019    There are no preventive care reminders to display for this patient.  Lab Results  Component Value Date   TSH 0.70 10/30/2019   Lab Results  Component Value Date   WBC 6.5 02/15/2020   HGB 12.9 02/15/2020   HCT 38.0 02/15/2020   MCV 85.0 02/15/2020   PLT 319 02/15/2020   Lab Results  Component Value Date   NA 138 10/30/2019   K 4.2 10/30/2019   CO2 27 (A) 10/30/2019   GLUCOSE 112 09/16/2019   BUN 19 10/30/2019   CREATININE 0.7 10/30/2019   BILITOT 0.4 09/16/2019   ALKPHOS 57 08/07/2018   AST 19 10/30/2019   ALT 38 (A) 10/30/2019   PROT 6.2 09/16/2019   ALBUMIN 3.9 10/30/2019   CALCIUM 9.4 10/30/2019   ANIONGAP 9 08/27/2018   Lab Results  Component Value Date   CHOL 199 10/30/2019   Lab Results  Component Value Date   HDL 55 10/30/2019   Lab Results  Component Value Date   LDLCALC 123 10/30/2019   Lab Results  Component Value Date   TRIG 162 (A) 10/30/2019   Lab Results  Component Value Date   CHOLHDL 3.6 CALC 09/18/2006   Lab Results  Component Value Date   HGBA1C 6.2 10/30/2019      Assessment & Plan:   Problem List Items Addressed This Visit   None   Visit Diagnoses    History of UTI    -  Primary   Relevant Orders   POCT URINALYSIS DIP (CLINITEK) (Completed)   Recurrent UTI       Relevant Medications   conjugated estrogens (PREMARIN) vaginal cream   Rash          Rash -Right elbow-it definitely looks like it is from pressure discussed the importance of getting pressure off of the area by using either some type of donut cushion.  Her she suggested maybe even lambswool is worth a try but ultimately she will need to get pressure off of the area.  In the meantime can try clobetasol cream which she should still have at home from a prior prescription earlier this year to try just a thin layer at bedtime with her moisturizer on top for 2 weeks and then give her skin a  break if is not improving over 4 to 4-week period of time please let us know.  Recurrent UTI-we discussed options including treating prophylactically after intercourse, or using improving cream to help with vaginal atrophy which can increase risk for recurrent UTIs especially in postmenopausal women.  She is most interested in using an estrogen cream at this point.  Give instructions on how to apply and prescription sent to pharmacy we will monitor over the next 4 to 6 months if she is doing well at that point we might even be able to drop down to twice a week treatment  Meds ordered this encounter  Medications  . conjugated estrogens (PREMARIN) vaginal cream    Sig: 0.5-1 gram plied around vaginal opening and urethral area nightly for 2 weeks.  Then okay to decrease down to application 3 days/week    Dispense:  60 g    Refill:  2    Follow-up: No follow-ups on file.    Beatrice Lecher, MD

## 2020-03-03 NOTE — Patient Instructions (Signed)
Okay to use the clobetasol cream/ointment on your elbow at bedtime nightly for about 2 weeks but then you will need to give your skin a break.  Just put your moisturizer on top.  Do not continue to use the Neosporin or antibacterial ointments.  To get pressure off of the area.

## 2020-03-05 LAB — URINE CULTURE
MICRO NUMBER:: 11441526
SPECIMEN QUALITY:: ADEQUATE

## 2020-03-07 MED ORDER — SULFAMETHOXAZOLE-TRIMETHOPRIM 800-160 MG PO TABS: 1.0000 | ORAL_TABLET | Freq: Two times a day (BID) | ORAL | 0 refills | Status: DC

## 2020-03-07 NOTE — Addendum Note (Signed)
Addended by: Beatrice Lecher D on: 03/07/2020 08:24 AM   Modules accepted: Orders

## 2020-03-15 ENCOUNTER — Telehealth: Payer: Self-pay

## 2020-03-15 DIAGNOSIS — F410 Panic disorder [episodic paroxysmal anxiety] without agoraphobia: Secondary | ICD-10-CM | POA: Diagnosis not present

## 2020-03-15 DIAGNOSIS — N390 Urinary tract infection, site not specified: Secondary | ICD-10-CM

## 2020-03-15 DIAGNOSIS — F902 Attention-deficit hyperactivity disorder, combined type: Secondary | ICD-10-CM | POA: Diagnosis not present

## 2020-03-15 DIAGNOSIS — F422 Mixed obsessional thoughts and acts: Secondary | ICD-10-CM | POA: Diagnosis not present

## 2020-03-15 NOTE — Telephone Encounter (Signed)
[name] called and left a message stating she would like a urine culture. I will sign the order. When would you like her to go have the urine culture? Please advise.

## 2020-03-15 NOTE — Telephone Encounter (Signed)
Preferable a week after completing her ABX

## 2020-03-15 NOTE — Telephone Encounter (Signed)
Order placed and patient advised 

## 2020-03-22 DIAGNOSIS — N390 Urinary tract infection, site not specified: Secondary | ICD-10-CM | POA: Diagnosis not present

## 2020-03-23 LAB — URINE CULTURE
MICRO NUMBER:: 11509995
SPECIMEN QUALITY:: ADEQUATE

## 2020-04-01 DIAGNOSIS — E559 Vitamin D deficiency, unspecified: Secondary | ICD-10-CM | POA: Diagnosis not present

## 2020-04-01 DIAGNOSIS — E039 Hypothyroidism, unspecified: Secondary | ICD-10-CM | POA: Diagnosis not present

## 2020-04-01 DIAGNOSIS — E7800 Pure hypercholesterolemia, unspecified: Secondary | ICD-10-CM | POA: Diagnosis not present

## 2020-04-01 DIAGNOSIS — R7301 Impaired fasting glucose: Secondary | ICD-10-CM | POA: Diagnosis not present

## 2020-04-01 DIAGNOSIS — I10 Essential (primary) hypertension: Secondary | ICD-10-CM | POA: Diagnosis not present

## 2020-04-06 DIAGNOSIS — E039 Hypothyroidism, unspecified: Secondary | ICD-10-CM | POA: Diagnosis not present

## 2020-04-06 DIAGNOSIS — I10 Essential (primary) hypertension: Secondary | ICD-10-CM | POA: Diagnosis not present

## 2020-04-06 DIAGNOSIS — E559 Vitamin D deficiency, unspecified: Secondary | ICD-10-CM | POA: Diagnosis not present

## 2020-04-06 DIAGNOSIS — R7301 Impaired fasting glucose: Secondary | ICD-10-CM | POA: Diagnosis not present

## 2020-04-06 DIAGNOSIS — R5382 Chronic fatigue, unspecified: Secondary | ICD-10-CM | POA: Diagnosis not present

## 2020-04-06 DIAGNOSIS — E7800 Pure hypercholesterolemia, unspecified: Secondary | ICD-10-CM | POA: Diagnosis not present

## 2020-04-06 DIAGNOSIS — L659 Nonscarring hair loss, unspecified: Secondary | ICD-10-CM | POA: Diagnosis not present

## 2020-04-12 DIAGNOSIS — F902 Attention-deficit hyperactivity disorder, combined type: Secondary | ICD-10-CM | POA: Diagnosis not present

## 2020-04-12 DIAGNOSIS — F410 Panic disorder [episodic paroxysmal anxiety] without agoraphobia: Secondary | ICD-10-CM | POA: Diagnosis not present

## 2020-04-12 DIAGNOSIS — F422 Mixed obsessional thoughts and acts: Secondary | ICD-10-CM | POA: Diagnosis not present

## 2020-04-26 ENCOUNTER — Encounter: Payer: Self-pay | Admitting: Family Medicine

## 2020-04-29 ENCOUNTER — Other Ambulatory Visit: Payer: Self-pay | Admitting: Family Medicine

## 2020-05-05 ENCOUNTER — Other Ambulatory Visit: Payer: Self-pay

## 2020-05-05 ENCOUNTER — Ambulatory Visit (INDEPENDENT_AMBULATORY_CARE_PROVIDER_SITE_OTHER): Payer: Medicare Other | Admitting: Adult Health

## 2020-05-05 ENCOUNTER — Encounter: Payer: Self-pay | Admitting: Adult Health

## 2020-05-05 DIAGNOSIS — J4530 Mild persistent asthma, uncomplicated: Secondary | ICD-10-CM | POA: Diagnosis not present

## 2020-05-05 DIAGNOSIS — J309 Allergic rhinitis, unspecified: Secondary | ICD-10-CM

## 2020-05-05 NOTE — Assessment & Plan Note (Signed)
Controlled on rx   Plan  Patient Instructions  Continue on Symbicort 2 puffs daily, rinse after use.  Saline nasal rinses As needed   Continue on Allegra daily  Flonase 1 puff daily for 1 week then As needed   Delsym 2 tsp Twice daily  As needed  Cough .  Please contact office for sooner follow up if symptoms do not improve or worsen or seek emergency care  Follow up Dr. Elsworth Soho  In  1 year and As needed

## 2020-05-05 NOTE — Progress Notes (Signed)
@Patient  ID: [name], female    DOB: 19-May-1945, 75 y.o.   MRN: [id number]  Chief Complaint  Patient presents with  . Follow-up    Referring provider: Hali Marry, *  HPI: 75 year old female former smoker followed for mild persistent asthma, allergic rhinitis and chronic cough with vocal cord dysfunction Medical hx of RA and OA . Previously on Methotrexate .    Significant tests/ events :  Spirometry 06/2013 ratio 63 FEV1 normal.                   05/05/2020 Follow up : Asthma and Allergic Rhinitis  Patient presents for a 1 year follow-up.  She has underlying mild asthma and allergic rhinitis.  Says overall breathing is doing well.  She is on Symbicort 2 puffs once daily.  Daily.    Uses Allegra everyday . Takes Flonase as needed. No flare in cough or wheezing  Remains active at home, lots of grandkids. Helps with them a lot . Has chronic back pain from RA and OA . Takes Mobic daily .  Chest xray 08/2019 was clear .                  Allergies  Allergen Reactions  . Astelin [Azelastine Hcl] Other (See Comments)    Headache  . Celebrex [Celecoxib] Other (See Comments)    Stomach issues  . Lisinopril Other (See Comments) and Cough    ACE cough  . Methotrexate Derivatives Other (See Comments)  . Morphine Nausea Only  . Moxifloxacin Other (See Comments)    REACTION: confusion, dizziness, paranoia    Immunization History  Administered Date(s) Administered  . Fluad Quad(high Dose 65+) 12/24/2018, 12/28/2019  . Influenza Split 11/07/2011, 01/12/2013  . Influenza Whole 02/12/2005, 12/02/2007, 11/24/2008  . Influenza, High Dose Seasonal PF 11/19/2016, 12/10/2017  . Influenza,inj,Quad PF,6+ Mos 01/11/2015  . Influenza-Unspecified 11/07/2011, 01/12/2013, 11/12/2013, 01/11/2015, 12/24/2018  . PFIZER(Purple Top)SARS-COV-2 Vaccination 03/20/2019, 04/15/2019  . Pneumococcal Conjugate-13 04/26/2014  . Pneumococcal Polysaccharide-23 02/12/2001, 11/27/2001,  11/28/2006, 11/07/2011  . Td 02/12/1998, 06/13/1998  . Tdap 09/23/2012    Past Medical History:  Diagnosis Date  . Allergic rhinitis, cause unspecified   . Anxiety   . Benign paroxysmal positional vertigo   . Cancer ([place])    basal cell carcinoma per right side of nostril  . CHF (congestive heart failure) ([place]) 11/09/2016   had 2+ edema in legs with anxiety and SOB  . Complication of anesthesia    has 4  degenerative discs in neck.  . Depressive disorder, not elsewhere classified   . Esophageal reflux   . History of hiatal hernia   . Hypertension   . Localized osteoarthrosis not specified whether primary or secondary, lower leg   . Other and unspecified hyperlipidemia   . Other diseases of vocal cords   . Peptic ulcer, unspecified site, unspecified as acute or chronic, without mention of hemorrhage, perforation, or obstruction   . Pneumonia    bilat pneumonia 1987  . Pre-diabetes    no meds,just diet controll  . TMJ (temporomandibular joint disorder)   . Unspecified arthropathy, hand   . Unspecified asthma(493.90)    triggered with a virus   . Unspecified hypothyroidism     Tobacco History: Social History   Tobacco Use  Smoking Status Former Smoker  . Packs/day: 0.30  . Years: 2.00  . Pack years: 0.60  . Types: Cigarettes  . Quit date: 02/12/1958  . Years since quitting: 28.2  Smokeless Tobacco Never Used   Counseling given: Not Answered   Outpatient Medications Prior to Visit  Medication Sig Dispense Refill  . albuterol (PROVENTIL HFA) 108 (90 Base) MCG/ACT inhaler Inhale 2 puffs into the lungs every 6 (six) hours as needed for shortness of breath. 6.7 g 5  . Ascorbic Acid (VITAMIN C WITH ROSE HIPS) 1000 MG tablet Take 1,000 mg by mouth daily.    . budesonide-formoterol (SYMBICORT) 80-4.5 MCG/ACT inhaler Inhale 2 puffs into the lungs 2 (two) times daily. 30.6 Inhaler 3  . buPROPion (WELLBUTRIN XL) 150 MG 24 hr tablet Take 300 mg by mouth daily. 2 in the am    .  Calcium Carb-Cholecalciferol (CALCIUM 600+D3 PO) Take 1 tablet by mouth 2 (two) times a day.    . Carboxymethylcell-Hypromellose (GENTEAL OP) Place 1-2 drops into both eyes 3 (three) times daily as needed (for dry eyes).    . Cholecalciferol (VITAMIN D3) 50 MCG (2000 UT) capsule Take 200 Units by mouth daily.     . clonazePAM (KLONOPIN) 0.5 MG tablet Take 0.25 mg by mouth 2 (two) times daily as needed for anxiety.    . conjugated estrogens (PREMARIN) vaginal cream 0.5-1 gram plied around vaginal opening and urethral area nightly for 2 weeks.  Then okay to decrease down to application 3 days/week 60 g 2  . famotidine (PEPCID) 20 MG tablet Take 20 mg by mouth at bedtime.     . fexofenadine (ALLEGRA) 180 MG tablet Take 180 mg by mouth at bedtime.    . folic acid (FOLVITE) 1 MG tablet Take 1 mg by mouth 2 (two) times daily.    . hydrochlorothiazide (HYDRODIURIL) 25 MG tablet TAKE 1 TABLET BY MOUTH EVERY DAY 90 tablet 2  . levothyroxine (SYNTHROID, LEVOTHROID) 75 MCG tablet Take 75 mcg by mouth See admin instructions. Take 75 mcg by mouth daily on all days except Sunday    . levothyroxine (SYNTHROID, LEVOTHROID) 88 MCG tablet Take 88 mcg by mouth every Sunday.     . Lido-Menthol-Methyl Sal-Camph (CBD KINGS EX) Apply topically.    Marland Kitchen losartan (COZAAR) 50 MG tablet TAKE 1 TABLET BY MOUTH EVERY DAY 90 tablet 3  . Magnesium 100 MG TABS Take 150 mg by mouth 2 (two) times a day.    . meloxicam (MOBIC) 15 MG tablet TAKE 1 TABLET (15 MG TOTAL) BY MOUTH EVERY MORNING. 90 tablet 1  . Menthol, Topical Analgesic, (BIOFREEZE EX) Apply topically.    . metroNIDAZOLE (METROGEL) 1 % gel Apply topically daily. (Patient taking differently: Apply 1 application topically as needed.) 60 g 1  . pantoprazole (PROTONIX) 40 MG tablet TAKE 1 TABLET BY MOUTH EVERY DAY 90 tablet 3  . PARoxetine (PAXIL-CR) 12.5 MG 24 hr tablet Take 25 mg by mouth 2 (two) times daily.    . diphenoxylate-atropine (LOMOTIL) 2.5-0.025 MG tablet One to 2  tablets by mouth 4 times a day as needed for diarrhea. (Patient not taking: Reported on 05/05/2020) 30 tablet 0  . sulfamethoxazole-trimethoprim (BACTRIM DS) 800-160 MG tablet Take 1 tablet by mouth 2 (two) times daily. (Patient not taking: Reported on 05/05/2020) 6 tablet 0  . triamcinolone (KENALOG) 0.1 % APPLY 1 APPLICATION TOPICALLY 2 (TWO) TIMES DAILY. TO THE CLOSED ITCHY BUMPS ON THE SKIN (Patient not taking: Reported on 05/05/2020) 30 g 0   No facility-administered medications prior to visit.     Review of Systems:   Constitutional:   No  weight loss, night sweats,  Fevers, chills, fatigue, or  lassitude.  HEENT:   No headaches,  Difficulty swallowing,  Tooth/dental problems, or  Sore throat,                No sneezing, itching, ear ache, nasal congestion, post nasal drip,   CV:  No chest pain,  Orthopnea, PND, swelling in lower extremities, anasarca, dizziness, palpitations, syncope.   GI  No heartburn, indigestion, abdominal pain, nausea, vomiting, diarrhea, change in bowel habits, loss of appetite, bloody stools.   Resp: No shortness of breath with exertion or at rest.  No excess mucus, no productive cough,  No non-productive cough,  No coughing up of blood.  No change in color of mucus.  No wheezing.  No chest wall deformity  Skin: no rash or lesions.  GU: no dysuria, change in color of urine, no urgency or frequency.  No flank pain, no hematuria   MS:  No joint pain or swelling.  No decreased range of motion.  No back pain.    Physical Exam  BP 120/60 (BP Location: Left Arm, Patient Position: Sitting, Cuff Size: Normal)   Pulse 91   Temp 97.7 F (36.5 C) (Temporal)   Ht 5\' 2"  (1.575 m)   Wt 187 lb 9.6 oz (85.1 kg)   SpO2 95%   BMI 34.31 kg/m   GEN: A/Ox3; pleasant , NAD, well nourished    HEENT:  [place]/AT,  EACs-clear, TMs-wnl, NOSE-clear, THROAT-clear, no lesions, no postnasal drip or exudate noted.   NECK:  Supple w/ fair ROM; no JVD; normal carotid impulses w/o  bruits; no thyromegaly or nodules palpated; no lymphadenopathy.    RESP  Clear  P & A; w/o, wheezes/ rales/ or rhonchi. no accessory muscle use, no dullness to percussion  CARD:  RRR, no m/r/g, no peripheral edema, pulses intact, no cyanosis or clubbing.  GI:   Soft & nt; nml bowel sounds; no organomegaly or masses detected.   Musco: Warm bil, no deformities or joint swelling noted.   Neuro: alert, no focal deficits noted.    Skin: Warm, no lesions or rashes    Lab Results:  CBC  BMET    Imaging: No results found.    No flowsheet data found.  No results found for: NITRICOXIDE      Assessment & Plan:   Asthma with allergic rhinitis Compensated on present regimen   Plan  Patient Instructions  Continue on Symbicort 2 puffs daily, rinse after use.  Saline nasal rinses As needed   Continue on Allegra daily  Flonase 1 puff daily for 1 week then As needed   Delsym 2 tsp Twice daily  As needed  Cough .  Please contact office for sooner follow up if symptoms do not improve or worsen or seek emergency care  Follow up Dr. Elsworth Soho  In  1 year and As needed       Allergic rhinitis Controlled on rx   Plan  Patient Instructions  Continue on Symbicort 2 puffs daily, rinse after use.  Saline nasal rinses As needed   Continue on Allegra daily  Flonase 1 puff daily for 1 week then As needed   Delsym 2 tsp Twice daily  As needed  Cough .  Please contact office for sooner follow up if symptoms do not improve or worsen or seek emergency care  Follow up Dr. Elsworth Soho  In  1 year and As needed          Rexene Edison, NP 05/05/2020

## 2020-05-05 NOTE — Assessment & Plan Note (Signed)
Compensated on present regimen   Plan  Patient Instructions  Continue on Symbicort 2 puffs daily, rinse after use.  Saline nasal rinses As needed   Continue on Allegra daily  Flonase 1 puff daily for 1 week then As needed   Delsym 2 tsp Twice daily  As needed  Cough .  Please contact office for sooner follow up if symptoms do not improve or worsen or seek emergency care  Follow up Dr. Elsworth Soho  In  1 year and As needed

## 2020-05-05 NOTE — Patient Instructions (Addendum)
Continue on Symbicort 2 puffs daily, rinse after use.  Saline nasal rinses As needed   Continue on Allegra daily  Flonase 1 puff daily for 1 week then As needed   Delsym 2 tsp Twice daily  As needed  Cough .  Please contact office for sooner follow up if symptoms do not improve or worsen or seek emergency care  Follow up Dr. Elsworth Soho  In  1 year and As needed

## 2020-05-10 ENCOUNTER — Encounter: Payer: Self-pay | Admitting: Family Medicine

## 2020-05-30 ENCOUNTER — Other Ambulatory Visit: Payer: Self-pay | Admitting: Family Medicine

## 2020-05-31 ENCOUNTER — Telehealth (INDEPENDENT_AMBULATORY_CARE_PROVIDER_SITE_OTHER): Payer: Medicare Other | Admitting: Family Medicine

## 2020-05-31 VITALS — Temp 99.3°F | Ht 62.0 in | Wt 183.0 lb

## 2020-05-31 DIAGNOSIS — F422 Mixed obsessional thoughts and acts: Secondary | ICD-10-CM | POA: Diagnosis not present

## 2020-05-31 DIAGNOSIS — J0110 Acute frontal sinusitis, unspecified: Secondary | ICD-10-CM

## 2020-05-31 DIAGNOSIS — F902 Attention-deficit hyperactivity disorder, combined type: Secondary | ICD-10-CM | POA: Diagnosis not present

## 2020-05-31 DIAGNOSIS — F410 Panic disorder [episodic paroxysmal anxiety] without agoraphobia: Secondary | ICD-10-CM | POA: Diagnosis not present

## 2020-05-31 MED ORDER — AMOXICILLIN-POT CLAVULANATE 875-125 MG PO TABS: 1.0000 | ORAL_TABLET | Freq: Two times a day (BID) | ORAL | 0 refills | Status: DC

## 2020-05-31 NOTE — Progress Notes (Signed)
Virtual Visit via Video Note  I connected with [name] on 05/31/20 at  9:50 AM EDT by a video enabled telemedicine application and verified that I am speaking with the correct person using two identifiers.   I discussed the limitations of evaluation and management by telemedicine and the availability of in person appointments. The patient expressed understanding and agreed to proceed.  Patient location:at home  Provider location: in office  Subjective:    CC: Sinus sxs   HPI: Last week - congestion started (had a lot of family over). Green mucous. Sore throat. worse at night. Low grade fever for a couple of days.  Fever 100.1 yesterday.  Had a neg COVID test on Sat.  Sleeping a lot.  Body aches. No cough, no headaches. Using simply saline, tylenol.  Having pressure in her forehead.  No nausea or loose stools.  Using her Allegra QD and Pepcid and Trieze.  Past medical history, Surgical history, Family history not pertinant except as noted below, Social history, Allergies, and medications have been entered into the medical record, reviewed, and corrections made.   Review of Systems: No fevers, chills, night sweats, weight loss, chest pain, or shortness of breath.   Objective:    General: Speaking clearly in complete sentences without any shortness of breath.  Alert and oriented x3.  Normal judgment. No apparent acute distress.    Impression and Recommendations:    No problem-specific Assessment & Plan notes found for this encounter.  Acute sinusitis- will treat with Augmentin. Call if not better in one week. Continue symptomatic care.      Time spent in encounter 18 minutes  I discussed the assessment and treatment plan with the patient. The patient was provided an opportunity to ask questions and all were answered. The patient agreed with the plan and demonstrated an understanding of the instructions.   The patient was advised to call back or seek an in-person  evaluation if the symptoms worsen or if the condition fails to improve as anticipated.   Beatrice Lecher, MD

## 2020-05-31 NOTE — Progress Notes (Signed)
Last week - congestion started (had a lot of family over) Green mucous Sore throat  Fever 100.1 yesterday Body aches  No cough, no headaches  Using simply saline, tylenol

## 2020-06-06 DIAGNOSIS — H5213 Myopia, bilateral: Secondary | ICD-10-CM | POA: Diagnosis not present

## 2020-06-06 DIAGNOSIS — H43813 Vitreous degeneration, bilateral: Secondary | ICD-10-CM | POA: Diagnosis not present

## 2020-06-06 DIAGNOSIS — H25813 Combined forms of age-related cataract, bilateral: Secondary | ICD-10-CM | POA: Diagnosis not present

## 2020-06-06 DIAGNOSIS — H04123 Dry eye syndrome of bilateral lacrimal glands: Secondary | ICD-10-CM | POA: Diagnosis not present

## 2020-06-08 ENCOUNTER — Telehealth: Payer: Self-pay | Admitting: *Deleted

## 2020-06-08 NOTE — Telephone Encounter (Signed)
Patient was here with her husband at his appointment to see dr Stanford Breed and would like to start coming to the [facility] office as it is very close to their home. Dr Stanford Breed is fine with seeing the patient. Will get approval from dr hochrein and then call and get her scheduled.

## 2020-06-08 NOTE — Telephone Encounter (Signed)
Follow up scheduled

## 2020-06-14 DIAGNOSIS — F422 Mixed obsessional thoughts and acts: Secondary | ICD-10-CM | POA: Diagnosis not present

## 2020-06-14 DIAGNOSIS — F902 Attention-deficit hyperactivity disorder, combined type: Secondary | ICD-10-CM | POA: Diagnosis not present

## 2020-06-14 DIAGNOSIS — F410 Panic disorder [episodic paroxysmal anxiety] without agoraphobia: Secondary | ICD-10-CM | POA: Diagnosis not present

## 2020-07-01 ENCOUNTER — Telehealth: Payer: Self-pay | Admitting: Cardiology

## 2020-07-01 NOTE — Telephone Encounter (Signed)
Spoke with pt, she reports increased SOB when climbing stairs in her home. She also reports racing of her heart that can last up to 20 minutes at its worst. She denies chest pain but sees stars in her eyes with this. She will get swelling if she eats a lot of sodium but that is usually gone by the next morning. She sleeps mainly sitting up in her reclining bed because of reflux but reports she can not lay flat and breathe. She has slept this way for sometime.  Aware if not willing to see APP there are no openings prior to her current appt. She is wondering if there is any testing we can do prior to the appointment. Will forward for dr Stanford Breed review

## 2020-07-01 NOTE — Telephone Encounter (Signed)
Pt c/o Shortness Of Breath: STAT if SOB developed within the last 24 hours or pt is noticeably SOB on the phone  1. Are you currently SOB (can you hear that pt is SOB on the phone)? Yes   2. How long have you been experiencing SOB? Awhile   3. Are you SOB when sitting or when up moving around? Moving around   4. Are you currently experiencing any other symptoms? Nausea

## 2020-07-01 NOTE — Telephone Encounter (Signed)
Received a call from patient she stated she has been sob for the past 3 to 4 months.Sob has been worse the past 1 month.Sob is with exertion.No chest pain.Stated she does get nauseated.Stated she only wants to see Dr.Crenshaw.She would like to see him at the [place] office.Advised I will send message to Dr.Crenshaw's RN.

## 2020-07-04 DIAGNOSIS — I10 Essential (primary) hypertension: Secondary | ICD-10-CM | POA: Diagnosis not present

## 2020-07-04 DIAGNOSIS — E7800 Pure hypercholesterolemia, unspecified: Secondary | ICD-10-CM | POA: Diagnosis not present

## 2020-07-04 DIAGNOSIS — E039 Hypothyroidism, unspecified: Secondary | ICD-10-CM | POA: Diagnosis not present

## 2020-07-04 DIAGNOSIS — R7301 Impaired fasting glucose: Secondary | ICD-10-CM | POA: Diagnosis not present

## 2020-07-04 NOTE — Telephone Encounter (Signed)
Left message for patient with Dr Youlanda Roys recommendations.  She is to call back if she would like to see the APP.

## 2020-07-05 ENCOUNTER — Encounter: Payer: Self-pay | Admitting: Physician Assistant

## 2020-07-05 ENCOUNTER — Other Ambulatory Visit: Payer: Self-pay

## 2020-07-05 ENCOUNTER — Ambulatory Visit (INDEPENDENT_AMBULATORY_CARE_PROVIDER_SITE_OTHER): Payer: Medicare Other | Admitting: Physician Assistant

## 2020-07-05 VITALS — BP 137/77 | HR 84 | Ht 62.0 in | Wt 189.0 lb

## 2020-07-05 DIAGNOSIS — H1132 Conjunctival hemorrhage, left eye: Secondary | ICD-10-CM

## 2020-07-05 NOTE — Progress Notes (Signed)
Subjective:    Patient ID: [name], female    DOB: 1945-02-26, 75 y.o.   MRN: [id number]  HPI  Pt is a 75 yo female who presents to the clinic with her left eye very red after waking up this morning. She admits she has lots of sinus issues that cause her to blow her nose, cough, sneeze a lot. She denies any eye pain or vision changes. She denies any itchy or eye discharge. No fever, chills, sinus pressure. She has ongoing nasal and sinus congestion.  She has done nothing to make better.   .. Active Ambulatory Problems    Diagnosis Date Noted  . Hypothyroidism 09/24/2006  . HYPERLIPIDEMIA 09/23/2006  . DEPRESSION 09/24/2006  . VERTIGO, BENIGN PAROXYSMAL POSITION 09/24/2006  . Allergic rhinitis 11/05/2006  . VOCAL CORD DISORDER 11/05/2006  . Asthma with allergic rhinitis 11/05/2006  . GERD 09/24/2006  . Rheumatoid arthritis with rheumatoid factor ([place]) 11/30/2008  . LOC OSTEOARTHROS NOT SPEC PRIM/SEC LOWER LEG 03/19/2007  . SOMNOLENCE 11/25/2009  . POSTMENOPAUSAL STATUS 11/30/2008  . Abnormal liver enzymes 10/25/2010  . CARRIER/SUSPECTED CARRIER GROUP B STREPTOCOCCUS 11/17/2010  . Obesity (BMI 30-39.9) 05/03/2011  . Abnormal mammogram with microcalcification 10/04/2011  . Atypical ductal hyperplasia of breast 10/29/2011  . OCD (obsessive compulsive disorder) 05/09/2012  . Oral herpes 06/23/2012  . Vitreous detachment 09/23/2012  . IFG (impaired fasting glucose) 01/25/2014  . Essential hypertension, benign 05/26/2014  . Primary osteoarthritis of right hip 05/27/2014  . S/P right THA, AA 08/10/2014  . Venous stasis ulcer of left lower extremity ([place]) 03/03/2015  . Tendon nodule 01/16/2016  . Fatty liver 11/09/2016  . Vitamin D deficiency 06/19/2016  . Chronic fatigue 06/19/2016  . Alopecia 06/19/2016  . Aortic atherosclerosis ([place]) 09/26/2017  . S/P left TKA 08/26/2018  . Overweight (BMI 25.0-29.9) 08/27/2018  . Pain in right knee 01/03/2018  . Lumbar degenerative  disc disease 11/07/2018  . Cervical pain 11/07/2018  . Drug-induced constipation 05/25/2019  . Diarrhea 05/25/2019  . Lower abdominal pain 05/25/2019  . Blood in stool 05/25/2019  . Internal hemorrhoids 05/25/2019  . Family history of colon cancer 05/25/2019  . Acute cystitis 09/09/2019  . Right renal stone 09/21/2019  . Bullous pemphigoid 11/06/2019  . Educated about COVID-19 virus infection 11/09/2019  . Type 2 diabetes mellitus with complication, without long-term current use of insulin ([place]) 11/09/2019  . Occipital pain 12/28/2019  . Numbness and tingling sensation of skin 12/29/2019  . Spondylosis without myelopathy or radiculopathy, cervical region 12/29/2019   Resolved Ambulatory Problems    Diagnosis Date Noted  . EUSTACHIAN TUBE DYSFUNCTION, LEFT 04/13/2009  . Acute maxillary sinusitis 01/19/2009  . PEPTIC ULCER DISEASE 11/05/2006  . ARTHRITIS, HANDS, BILATERAL 12/22/2007  . Precordial pain 06/23/2008  . Chest pain 10/25/2010  . Dyspnea 10/25/2010  . Dyspnea 06/26/2013  . ACE-inhibitor cough 05/26/2014  . Preoperative clearance 08/03/2014  . Acute sinusitis 04/12/2015  . Pain of left hand 05/22/2017   Past Medical History:  Diagnosis Date  . Allergic rhinitis, cause unspecified   . Anxiety   . Cancer ([place])   . CHF (congestive heart failure) ([place]) 11/09/2016  . Complication of anesthesia   . History of hiatal hernia   . Hypertension   . Pneumonia   . Pre-diabetes   . TMJ (temporomandibular joint disorder)   . Unspecified asthma(493.90)   . Unspecified hypothyroidism        Review of Systems  All other systems reviewed and are negative.  Objective:   Physical Exam Vitals reviewed.  Constitutional:      Appearance: Normal appearance.  HENT:     Head: Normocephalic.  Eyes:     Extraocular Movements: Extraocular movements intact.     Pupils: Pupils are equal, round, and reactive to light.     Comments: Left injected/hemorrhaged conjunctiva in  all areas. No discharge. No eyelid swelling.   Neurological:     Mental Status: She is alert.  Psychiatric:        Mood and Affect: Mood normal.           Assessment & Plan:  Marland KitchenMarland KitchenDerriona was seen today for eye problem.  Diagnoses and all orders for this visit:  Subconjunctival hemorrhage of left eye   Reassured patient. Likely caused by blowing nose. Cool compresses. Ok to use visine. Time will resolve. Call office if any vision changes, eye discharge other than watery, eye pain.

## 2020-07-05 NOTE — Patient Instructions (Signed)
Subconjunctival Hemorrhage Subconjunctival hemorrhage is bleeding that happens between the white part of your eye (sclera) and the clear membrane that covers the outside of your eye (conjunctiva). There are many tiny blood vessels near the surface of your eye. A subconjunctival hemorrhage happens when one or more of these vessels breaks and bleeds, causing a red patch to appear on your eye. This is similar to a bruise. Depending on the amount of bleeding, the red patch may only cover a small area of your eye or it may cover the entire visible part of the sclera. If a lot of blood collects under the conjunctiva, there may also be swelling. Subconjunctival hemorrhages do not affect your vision or cause pain, but your eye may feel irritated if there is swelling. Subconjunctival hemorrhages usually do not require treatment, and they usually disappear on their own within two weeks. What are the causes? This condition may be caused by:  Mild trauma, such as rubbing your eye too hard.  Blunt injuries, such as from playing sports or having contact with a deployed airbag.  Coughing, sneezing, or vomiting.  Straining, such as when lifting a heavy object.  High blood pressure.  Recent eye surgery.  Diabetes.  Certain medicines, especially blood thinners (anticoagulants).  Other conditions, such as eye tumors, bleeding disorders, or blood vessel abnormalities. Subconjunctival hemorrhages can also happen without an obvious cause. What are the signs or symptoms? Symptoms of this condition include:  A bright red or dark red patch on the white part of the eye. The red area may: ? Spread out to cover a larger area of the eye before it goes away. ? Turn brownish-yellow before it goes away.  Swelling around the eye.  Mild eye irritation.   How is this diagnosed? This condition is diagnosed with a physical exam. If your subconjunctival hemorrhage was caused by trauma, your health care provider may  refer you to an eye specialist (ophthalmologist) or another specialist to check for other injuries. You may have other tests, including:  An eye exam.  A blood pressure check.  Blood tests to check for bleeding disorders. If your subconjunctival hemorrhage was caused by trauma, X-rays or a CT scan may be done to check for other injuries. How is this treated? Usually, treatment is not needed for this condition. If you have discomfort, your health care provider may recommend eye drops or cold compresses. Follow these instructions at home:  Take over-the-counter and prescription medicines only as directed by your health care provider.  Use eye drops or cold compresses to help with discomfort as directed by your health care provider.  Avoid activities, things, and environments that may irritate or injure your eye.  Keep all follow-up visits as told by your health care provider. This is important. Contact a health care provider if:  You have pain in your eye.  The bleeding does not go away within 3 weeks.  You keep getting new subconjunctival hemorrhages. Get help right away if:  Your vision changes or you have difficulty seeing.  You suddenly develop severe sensitivity to light.  You develop a severe headache, persistent vomiting, confusion, or abnormal tiredness (lethargy).  Your eye seems to bulge or protrude from your eye socket.  You develop unexplained bruises on your body.  You have unexplained bleeding in another area of your body. Summary  Subconjunctival hemorrhage is bleeding that happens between the white part of your eye and the clear membrane that covers the outside of your eye.    This condition is similar to a bruise.  Subconjunctival hemorrhages usually do not require treatment, and they usually disappear on their own within two weeks.  Use eye drops or cold compresses to help with discomfort as directed by your health care provider. This information is not  intended to replace advice given to you by your health care provider. Make sure you discuss any questions you have with your health care provider. Document Revised: 07/16/2018 Document Reviewed: 10/30/2017 Elsevier Patient Education  2021 Elsevier Inc.  

## 2020-07-07 DIAGNOSIS — I10 Essential (primary) hypertension: Secondary | ICD-10-CM | POA: Diagnosis not present

## 2020-07-07 DIAGNOSIS — R5382 Chronic fatigue, unspecified: Secondary | ICD-10-CM | POA: Diagnosis not present

## 2020-07-07 DIAGNOSIS — E7800 Pure hypercholesterolemia, unspecified: Secondary | ICD-10-CM | POA: Diagnosis not present

## 2020-07-07 DIAGNOSIS — R7301 Impaired fasting glucose: Secondary | ICD-10-CM | POA: Diagnosis not present

## 2020-07-07 DIAGNOSIS — E039 Hypothyroidism, unspecified: Secondary | ICD-10-CM | POA: Diagnosis not present

## 2020-07-07 DIAGNOSIS — E559 Vitamin D deficiency, unspecified: Secondary | ICD-10-CM | POA: Diagnosis not present

## 2020-07-07 DIAGNOSIS — L659 Nonscarring hair loss, unspecified: Secondary | ICD-10-CM | POA: Diagnosis not present

## 2020-07-19 DIAGNOSIS — F422 Mixed obsessional thoughts and acts: Secondary | ICD-10-CM | POA: Diagnosis not present

## 2020-07-19 DIAGNOSIS — F902 Attention-deficit hyperactivity disorder, combined type: Secondary | ICD-10-CM | POA: Diagnosis not present

## 2020-07-19 DIAGNOSIS — F410 Panic disorder [episodic paroxysmal anxiety] without agoraphobia: Secondary | ICD-10-CM | POA: Diagnosis not present

## 2020-07-21 ENCOUNTER — Other Ambulatory Visit: Payer: Self-pay | Admitting: Pulmonary Disease

## 2020-08-23 DIAGNOSIS — F902 Attention-deficit hyperactivity disorder, combined type: Secondary | ICD-10-CM | POA: Diagnosis not present

## 2020-08-23 DIAGNOSIS — F422 Mixed obsessional thoughts and acts: Secondary | ICD-10-CM | POA: Diagnosis not present

## 2020-08-23 DIAGNOSIS — F410 Panic disorder [episodic paroxysmal anxiety] without agoraphobia: Secondary | ICD-10-CM | POA: Diagnosis not present

## 2020-09-13 DIAGNOSIS — F422 Mixed obsessional thoughts and acts: Secondary | ICD-10-CM | POA: Diagnosis not present

## 2020-09-13 DIAGNOSIS — F410 Panic disorder [episodic paroxysmal anxiety] without agoraphobia: Secondary | ICD-10-CM | POA: Diagnosis not present

## 2020-09-13 DIAGNOSIS — F902 Attention-deficit hyperactivity disorder, combined type: Secondary | ICD-10-CM | POA: Diagnosis not present

## 2020-09-14 NOTE — Progress Notes (Deleted)
HPI: Follow-up chest pain.  Previously followed by Dr. Percival Spanish.  She apparently has had problems with chest pain previously.  Echocardiogram May 2015 showed normal LV function, trace aortic insufficiency and mild left atrial enlargement.  Nuclear study August 2017 showed ejection fraction 72% and normal perfusion.  Exercise treadmill August 2019 felt to be negative adequate.  Abdominal CT in 8/21 demonstrated aortic atherosclerosis.  Since she was last seen  Current Outpatient Medications  Medication Sig Dispense Refill   albuterol (PROVENTIL HFA) 108 (90 Base) MCG/ACT inhaler Inhale 2 puffs into the lungs every 6 (six) hours as needed for shortness of breath. 6.7 g 5   Ascorbic Acid (VITAMIN C WITH ROSE HIPS) 1000 MG tablet Take 1,000 mg by mouth daily.     buPROPion (WELLBUTRIN XL) 150 MG 24 hr tablet Take 300 mg by mouth daily. 2 in the am     Calcium Carb-Cholecalciferol (CALCIUM 600+D3 PO) Take 1 tablet by mouth 2 (two) times a day.     Carboxymethylcell-Hypromellose (GENTEAL OP) Place 1-2 drops into both eyes 3 (three) times daily as needed (for dry eyes).     Cholecalciferol (VITAMIN D3) 50 MCG (2000 UT) capsule Take 200 Units by mouth daily.      clonazePAM (KLONOPIN) 0.5 MG tablet Take 0.25 mg by mouth 2 (two) times daily as needed for anxiety.     conjugated estrogens (PREMARIN) vaginal cream 0.5-1 gram plied around vaginal opening and urethral area nightly for 2 weeks.  Then okay to decrease down to application 3 days/week 60 g 2   diphenoxylate-atropine (LOMOTIL) 2.5-0.025 MG tablet One to 2 tablets by mouth 4 times a day as needed for diarrhea. 30 tablet 0   famotidine (PEPCID) 20 MG tablet Take 20 mg by mouth at bedtime.      fexofenadine (ALLEGRA) 180 MG tablet Take 180 mg by mouth at bedtime.     folic acid (FOLVITE) 1 MG tablet Take 1 mg by mouth 2 (two) times daily.     hydrochlorothiazide (HYDRODIURIL) 25 MG tablet TAKE 1 TABLET BY MOUTH EVERY DAY 90 tablet 2    levothyroxine (SYNTHROID, LEVOTHROID) 75 MCG tablet Take 75 mcg by mouth See admin instructions. Take 75 mcg by mouth daily on all days except Sunday     levothyroxine (SYNTHROID, LEVOTHROID) 88 MCG tablet Take 88 mcg by mouth every Sunday.      Lido-Menthol-Methyl Sal-Camph (CBD KINGS EX) Apply topically.     losartan (COZAAR) 50 MG tablet TAKE 1 TABLET BY MOUTH EVERY DAY 90 tablet 3   Magnesium 100 MG TABS Take 150 mg by mouth 2 (two) times a day.     meloxicam (MOBIC) 15 MG tablet TAKE 1 TABLET (15 MG TOTAL) BY MOUTH EVERY MORNING. 90 tablet 1   Menthol, Topical Analgesic, (BIOFREEZE EX) Apply topically.     metroNIDAZOLE (METROGEL) 1 % gel Apply topically daily. (Patient taking differently: Apply 1 application topically as needed.) 60 g 1   pantoprazole (PROTONIX) 40 MG tablet TAKE 1 TABLET BY MOUTH EVERY DAY 90 tablet 3   PARoxetine (PAXIL-CR) 12.5 MG 24 hr tablet Take 25 mg by mouth 2 (two) times daily.     SYMBICORT 80-4.5 MCG/ACT inhaler TAKE 2 PUFFS BY MOUTH TWICE A DAY 30.6 each 3   No current facility-administered medications for this visit.     Past Medical History:  Diagnosis Date   Allergic rhinitis, cause unspecified    Anxiety    Benign paroxysmal positional  vertigo    Cancer (HCC)    basal cell carcinoma per right side of nostril   CHF (congestive heart failure) ([place]) 11/09/2016   had 2+ edema in legs with anxiety and SOB   Complication of anesthesia    has 4  degenerative discs in neck.   Depressive disorder, not elsewhere classified    Esophageal reflux    History of hiatal hernia    Hypertension    Localized osteoarthrosis not specified whether primary or secondary, lower leg    Other and unspecified hyperlipidemia    Other diseases of vocal cords    Peptic ulcer, unspecified site, unspecified as acute or chronic, without mention of hemorrhage, perforation, or obstruction    Pneumonia    bilat pneumonia 1987   Pre-diabetes    no meds,just diet controll   TMJ  (temporomandibular joint disorder)    Unspecified arthropathy, hand    Unspecified asthma(493.90)    triggered with a virus    Unspecified hypothyroidism     Past Surgical History:  Procedure Laterality Date   BREAST BIOPSY  09/07/2011   High Risk Lesion   BREAST EXCISIONAL BIOPSY Left    BREAST SURGERY     LUMPECTOMY / LEFT 10/12/2011   KNEE ARTHROSCOPY     bilat    NASAL SINUS SURGERY     ROTATOR CUFF REPAIR     right    thumb surgery      left hand 45 years ago    TOTAL HIP ARTHROPLASTY Right 08/10/2014   Procedure: RIGHT TOTAL HIP ARTHROPLASTY ANTERIOR APPROACH;  Surgeon: Paralee Cancel, MD;  Location: [facility];  Service: Orthopedics;  Laterality: Right;   TOTAL KNEE ARTHROPLASTY Left 08/26/2018   Procedure: TOTAL KNEE ARTHROPLASTY;  Surgeon: Paralee Cancel, MD;  Location: [facility];  Service: Orthopedics;  Laterality: Left;  70 mins    Social History   Socioeconomic History   Marital status: Married    Spouse name: [name]   Number of children: 3   Years of education: 16   Highest education level: Bachelor's degree (e.g., BA, AB, BS)  Occupational History   Occupation: Retired    Comment: Pharmacist, hospital  Tobacco Use   Smoking status: Former    Packs/day: 0.30    Years: 2.00    Pack years: 0.60    Types: Cigarettes    Quit date: 02/12/1958    Years since quitting: 62.6   Smokeless tobacco: Never  Vaping Use   Vaping Use: Never used  Substance and Sexual Activity   Alcohol use: Yes    Alcohol/week: 1.0 standard drink    Types: 1 Glasses of wine per week    Comment: wine socially   Drug use: No   Sexual activity: Yes  Other Topics Concern   Not on file  Social History Narrative   Helps watch grand babys   Social Determinants of Health   Financial Resource Strain: Not on file  Food Insecurity: Not on file  Transportation Needs: Not on file  Physical Activity: Not on file  Stress: Not on file  Social Connections: Not on file  Intimate Partner Violence: Not on file     Family History  Problem Relation Age of Onset   Heart disease Father    Colon cancer Mother    Liver cancer Brother    Asthma Brother     ROS: no fevers or chills, productive cough, hemoptysis, dysphasia, odynophagia, melena, hematochezia, dysuria, hematuria, rash, seizure activity, orthopnea, PND, pedal edema, claudication.  Remaining systems are negative.  Physical Exam: Well-developed well-nourished in no acute distress.  Skin is warm and dry.  HEENT is normal.  Neck is supple.  Chest is clear to auscultation with normal expansion.  Cardiovascular exam is regular rate and rhythm.  Abdominal exam nontender or distended. No masses palpated. Extremities show no edema. neuro grossly intact  ECG- personally reviewed  A/P  1 chest pain-  2 dyspnea-  3 hypertension-  4 hyperlipidemia-  5 history of aortic atherosclerosis-  Kirk Ruths, MD

## 2020-09-21 ENCOUNTER — Ambulatory Visit: Payer: Medicare Other | Admitting: Cardiology

## 2020-09-29 DIAGNOSIS — E039 Hypothyroidism, unspecified: Secondary | ICD-10-CM | POA: Diagnosis not present

## 2020-09-29 DIAGNOSIS — I10 Essential (primary) hypertension: Secondary | ICD-10-CM | POA: Diagnosis not present

## 2020-09-29 DIAGNOSIS — R7301 Impaired fasting glucose: Secondary | ICD-10-CM | POA: Diagnosis not present

## 2020-09-29 DIAGNOSIS — E7800 Pure hypercholesterolemia, unspecified: Secondary | ICD-10-CM | POA: Diagnosis not present

## 2020-09-30 NOTE — Progress Notes (Signed)
HPI: Follow-up dyspnea and chest pain.  Previously followed by Dr. Percival Spanish but now presents to establish with me.  Echocardiogram May 2015 showed normal LV function, trace aortic insufficiency and mild left atrial enlargement.  Nuclear study August 2017 showed ejection fraction 72% with normal perfusion.  Exercise treadmill August 2019 negative adequate but poor exercise tolerance at 4 minutes and 41 seconds.  Abdominal CT August 2021 showed aortic atherosclerosis but no aneurysm.  Patient last seen by Dr. Percival Spanish September 2021.  Since then she does have dyspnea on exertion.  No orthopnea, PND, pedal edema or syncope.  She has chest pain that is diffuse in the upper chest and described as pressure.  It occurs with "stress" and is longstanding.  It does not occur with exertion.  Resolves spontaneously.  Current Outpatient Medications  Medication Sig Dispense Refill   albuterol (PROVENTIL HFA) 108 (90 Base) MCG/ACT inhaler Inhale 2 puffs into the lungs every 6 (six) hours as needed for shortness of breath. 6.7 g 5   Ascorbic Acid (VITAMIN C WITH ROSE HIPS) 1000 MG tablet Take 1,000 mg by mouth daily.     buPROPion (WELLBUTRIN XL) 300 MG 24 hr tablet      Calcium Carb-Cholecalciferol (CALCIUM 600+D3 PO) Take 1 tablet by mouth 2 (two) times a day.     Carboxymethylcell-Hypromellose (GENTEAL OP) Place 1-2 drops into both eyes 3 (three) times daily as needed (for dry eyes).     Cholecalciferol (VITAMIN D3) 50 MCG (2000 UT) capsule Take 200 Units by mouth daily.      clonazePAM (KLONOPIN) 0.5 MG tablet Take 0.25 mg by mouth 2 (two) times daily as needed for anxiety.     famotidine (PEPCID) 20 MG tablet Take 20 mg by mouth at bedtime.      fexofenadine (ALLEGRA) 180 MG tablet Take 180 mg by mouth at bedtime.     folic acid (FOLVITE) 1 MG tablet Take 1 mg by mouth 2 (two) times daily.     hydrochlorothiazide (HYDRODIURIL) 25 MG tablet TAKE 1 TABLET BY MOUTH EVERY DAY 90 tablet 2   levothyroxine  (SYNTHROID, LEVOTHROID) 75 MCG tablet Take 75 mcg by mouth See admin instructions. Take 75 mcg by mouth daily on all days except Sunday     levothyroxine (SYNTHROID, LEVOTHROID) 88 MCG tablet Take 88 mcg by mouth every Sunday.      Lido-Menthol-Methyl Sal-Camph (CBD KINGS EX) Apply topically.     losartan (COZAAR) 50 MG tablet TAKE 1 TABLET BY MOUTH EVERY DAY 90 tablet 3   Magnesium 100 MG TABS Take 150 mg by mouth 2 (two) times a day.     meloxicam (MOBIC) 15 MG tablet TAKE 1 TABLET (15 MG TOTAL) BY MOUTH EVERY MORNING. 90 tablet 1   Menthol, Topical Analgesic, (BIOFREEZE EX) Apply topically.     metroNIDAZOLE (METROGEL) 1 % gel Apply topically daily. (Patient taking differently: Apply 1 application topically as needed.) 60 g 1   pantoprazole (PROTONIX) 40 MG tablet TAKE 1 TABLET BY MOUTH EVERY DAY 90 tablet 3   PARoxetine (PAXIL-CR) 12.5 MG 24 hr tablet Take 25 mg by mouth 2 (two) times daily.     SYMBICORT 80-4.5 MCG/ACT inhaler TAKE 2 PUFFS BY MOUTH TWICE A DAY 30.6 each 3   No current facility-administered medications for this visit.     Past Medical History:  Diagnosis Date   Allergic rhinitis, cause unspecified    Anxiety    Benign paroxysmal positional vertigo  Cancer ([place])    basal cell carcinoma per right side of nostril   CHF (congestive heart failure) ([place]) 11/09/2016   had 2+ edema in legs with anxiety and SOB   Complication of anesthesia    has 4  degenerative discs in neck.   Depressive disorder, not elsewhere classified    Esophageal reflux    History of hiatal hernia    Hypertension    Localized osteoarthrosis not specified whether primary or secondary, lower leg    Other and unspecified hyperlipidemia    Other diseases of vocal cords    Peptic ulcer, unspecified site, unspecified as acute or chronic, without mention of hemorrhage, perforation, or obstruction    Pneumonia    bilat pneumonia 1987   Pre-diabetes    no meds,just diet controll   TMJ  (temporomandibular joint disorder)    Unspecified arthropathy, hand    Unspecified asthma(493.90)    triggered with a virus    Unspecified hypothyroidism     Past Surgical History:  Procedure Laterality Date   BREAST BIOPSY  09/07/2011   High Risk Lesion   BREAST EXCISIONAL BIOPSY Left    BREAST SURGERY     LUMPECTOMY / LEFT 10/12/2011   KNEE ARTHROSCOPY     bilat    NASAL SINUS SURGERY     ROTATOR CUFF REPAIR     right    thumb surgery      left hand 45 years ago    TOTAL HIP ARTHROPLASTY Right 08/10/2014   Procedure: RIGHT TOTAL HIP ARTHROPLASTY ANTERIOR APPROACH;  Surgeon: Paralee Cancel, MD;  Location: [facility];  Service: Orthopedics;  Laterality: Right;   TOTAL KNEE ARTHROPLASTY Left 08/26/2018   Procedure: TOTAL KNEE ARTHROPLASTY;  Surgeon: Paralee Cancel, MD;  Location: [facility];  Service: Orthopedics;  Laterality: Left;  70 mins    Social History   Socioeconomic History   Marital status: Married    Spouse name: [name]   Number of children: 3   Years of education: 16   Highest education level: Bachelor's degree (e.g., BA, AB, BS)  Occupational History   Occupation: Retired    Comment: Pharmacist, hospital  Tobacco Use   Smoking status: Former    Packs/day: 0.30    Years: 2.00    Pack years: 0.60    Types: Cigarettes    Quit date: 02/12/1958    Years since quitting: 62.7   Smokeless tobacco: Never  Vaping Use   Vaping Use: Never used  Substance and Sexual Activity   Alcohol use: Yes    Alcohol/week: 1.0 standard drink    Types: 1 Glasses of wine per week    Comment: wine socially   Drug use: No   Sexual activity: Yes  Other Topics Concern   Not on file  Social History Narrative   Helps watch grand babys   Social Determinants of Health   Financial Resource Strain: Not on file  Food Insecurity: Not on file  Transportation Needs: Not on file  Physical Activity: Not on file  Stress: Not on file  Social Connections: Not on file  Intimate Partner Violence: Not on file     Family History  Problem Relation Age of Onset   Heart disease Father    Colon cancer Mother    Liver cancer Brother    Asthma Brother     ROS: no fevers or chills, productive cough, hemoptysis, dysphasia, odynophagia, melena, hematochezia, dysuria, hematuria, rash, seizure activity, orthopnea, PND, pedal edema, claudication. Remaining systems are negative.  Physical Exam: Well-developed well-nourished in no acute distress.  Skin is warm and dry.  HEENT is normal.  Neck is supple.  Chest is clear to auscultation with normal expansion.  Cardiovascular exam is regular rate and rhythm.  Abdominal exam nontender or distended. No masses palpated. Extremities show no edema. neuro grossly intact  ECG-normal sinus rhythm at a rate of 76, RV conduction delay, left anterior fascicular block.  Personally reviewed  A/P  1 dyspnea-etiology unclear.  Not volume overloaded on examination.  I will arrange a [place] nuclear study to screen for ischemia.  2 chest pain-atypical.  We will arrange [place] nuclear study for risk stratification.  3 history of aortic atherosclerosis-continue low-dose statin.  4 hyperlipidemia-LDL not at goal.  She has not tolerated daily Lipitor in the past but does 2 to 3 days/week.  She will resume and we will check lipids and liver in 12 weeks.  If LDL not at goal we will add Zetia.  Kirk Ruths, MD

## 2020-10-04 DIAGNOSIS — E7800 Pure hypercholesterolemia, unspecified: Secondary | ICD-10-CM | POA: Diagnosis not present

## 2020-10-04 DIAGNOSIS — E039 Hypothyroidism, unspecified: Secondary | ICD-10-CM | POA: Diagnosis not present

## 2020-10-04 DIAGNOSIS — I10 Essential (primary) hypertension: Secondary | ICD-10-CM | POA: Diagnosis not present

## 2020-10-04 DIAGNOSIS — E559 Vitamin D deficiency, unspecified: Secondary | ICD-10-CM | POA: Diagnosis not present

## 2020-10-04 DIAGNOSIS — R7301 Impaired fasting glucose: Secondary | ICD-10-CM | POA: Diagnosis not present

## 2020-10-10 ENCOUNTER — Other Ambulatory Visit: Payer: Self-pay

## 2020-10-10 ENCOUNTER — Ambulatory Visit (INDEPENDENT_AMBULATORY_CARE_PROVIDER_SITE_OTHER): Payer: Medicare Other | Admitting: Cardiology

## 2020-10-10 ENCOUNTER — Encounter: Payer: Self-pay | Admitting: *Deleted

## 2020-10-10 ENCOUNTER — Encounter: Payer: Self-pay | Admitting: Cardiology

## 2020-10-10 VITALS — BP 110/62 | HR 76 | Ht 64.0 in | Wt 184.1 lb

## 2020-10-10 DIAGNOSIS — E785 Hyperlipidemia, unspecified: Secondary | ICD-10-CM | POA: Diagnosis not present

## 2020-10-10 DIAGNOSIS — R072 Precordial pain: Secondary | ICD-10-CM | POA: Diagnosis not present

## 2020-10-10 DIAGNOSIS — R0602 Shortness of breath: Secondary | ICD-10-CM

## 2020-10-10 NOTE — Patient Instructions (Signed)
  Lab Work:  Your physician recommends that you return for lab work in:12 [place]  If you have labs (blood work) drawn today and your tests are completely normal, you will receive your results only by: [place] (if you have [place]) OR A paper copy in the mail If you have any lab test that is abnormal or we need to change your treatment, we will call you to review the results.   Testing/Procedures:  Your physician has requested that you have a lexiscan myoview. For further information please visit [email or web address]. Please follow instruction sheet, as given. [place]  Follow-Up: At [facility], you and your health needs are our priority.  As part of our continuing mission to provide you with exceptional heart care, we have created designated Provider Care Teams.  These Care Teams include your primary Cardiologist (physician) and Advanced Practice Providers (APPs -  Physician Assistants and Nurse Practitioners) who all work together to provide you with the care you need, when you need it.  We recommend signing up for the patient portal called "MyChart".  Sign up information is provided on this After Visit Summary.  MyChart is used to connect with patients for Virtual Visits (Telemedicine).  Patients are able to view lab/test results, encounter notes, upcoming appointments, etc.  Non-urgent messages can be sent to your provider as well.   To learn more about what you can do with MyChart, go to [email or web address].    Your next appointment:   12 month(s)  The format for your next appointment:   In Person  Provider:   Kirk Ruths, MD

## 2020-10-12 ENCOUNTER — Other Ambulatory Visit: Payer: Self-pay | Admitting: Family Medicine

## 2020-10-13 ENCOUNTER — Telehealth (HOSPITAL_COMMUNITY): Payer: Self-pay

## 2020-10-13 NOTE — Telephone Encounter (Signed)
Detailed instructions left on the patient's answering machine. Asked to call back with any questions. S.Kaneesha Constantino EMTP 

## 2020-10-18 ENCOUNTER — Other Ambulatory Visit: Payer: Self-pay

## 2020-10-18 ENCOUNTER — Ambulatory Visit (HOSPITAL_COMMUNITY): Payer: Medicare Other | Attending: Cardiology

## 2020-10-18 DIAGNOSIS — R072 Precordial pain: Secondary | ICD-10-CM | POA: Diagnosis not present

## 2020-10-18 LAB — MYOCARDIAL PERFUSION IMAGING
Base ST Depression (mm): 0 mm
LV dias vol: 56 mL (ref 46–106)
LV sys vol: 15 mL — AB
Nuc Stress EF: 72 %
Peak HR: 89 {beats}/min
Rest HR: 75 {beats}/min
Rest Nuclear Isotope Dose: 10.3 mCi
SDS: 0
SRS: 0
SSS: 0
ST Depression (mm): 0 mm
Stress Nuclear Isotope Dose: 32.9 mCi
TID: 1.05

## 2020-10-18 MED ORDER — TECHNETIUM TC 99M TETROFOSMIN IV KIT
32.9000 | PACK | Freq: Once | INTRAVENOUS | Status: AC | PRN
  Administered 2020-10-18: 32.9 via INTRAVENOUS
  Filled 2020-10-18: qty 33

## 2020-10-18 MED ORDER — REGADENOSON 0.4 MG/5ML IV SOLN
0.4000 mg | Freq: Once | INTRAVENOUS | Status: AC
Start: 2020-10-18 — End: 2020-10-18
  Administered 2020-10-18: 0.4 mg via INTRAVENOUS

## 2020-10-18 MED ORDER — TECHNETIUM TC 99M TETROFOSMIN IV KIT
10.3000 | PACK | Freq: Once | INTRAVENOUS | Status: AC | PRN
  Administered 2020-10-18: 10.3 via INTRAVENOUS
  Filled 2020-10-18: qty 11

## 2020-10-25 DIAGNOSIS — F410 Panic disorder [episodic paroxysmal anxiety] without agoraphobia: Secondary | ICD-10-CM | POA: Diagnosis not present

## 2020-10-25 DIAGNOSIS — F422 Mixed obsessional thoughts and acts: Secondary | ICD-10-CM | POA: Diagnosis not present

## 2020-10-25 DIAGNOSIS — F902 Attention-deficit hyperactivity disorder, combined type: Secondary | ICD-10-CM | POA: Diagnosis not present

## 2020-10-28 ENCOUNTER — Encounter: Payer: Self-pay | Admitting: Family Medicine

## 2020-10-28 ENCOUNTER — Ambulatory Visit (INDEPENDENT_AMBULATORY_CARE_PROVIDER_SITE_OTHER): Payer: Medicare Other | Admitting: Family Medicine

## 2020-10-28 VITALS — BP 107/65 | HR 74 | Ht 64.0 in | Wt 183.0 lb

## 2020-10-28 DIAGNOSIS — E038 Other specified hypothyroidism: Secondary | ICD-10-CM | POA: Diagnosis not present

## 2020-10-28 DIAGNOSIS — R7301 Impaired fasting glucose: Secondary | ICD-10-CM | POA: Diagnosis not present

## 2020-10-28 DIAGNOSIS — I10 Essential (primary) hypertension: Secondary | ICD-10-CM | POA: Diagnosis not present

## 2020-10-28 DIAGNOSIS — I700 Atherosclerosis of aorta: Secondary | ICD-10-CM | POA: Diagnosis not present

## 2020-10-28 DIAGNOSIS — E559 Vitamin D deficiency, unspecified: Secondary | ICD-10-CM | POA: Diagnosis not present

## 2020-10-28 NOTE — Progress Notes (Signed)
Established Patient Office Visit  Subjective:  Patient ID: [name], female    DOB: 1946/01/16  Age: 75 y.o. MRN: [id number]  CC:  Chief Complaint  Patient presents with   Follow-up    HPI [name] presents for   Follow-up.  She was previously followed for diet with Dr. Letitia Neri  her endocrinologist for the last 24 years that he has officially retired.  He has been following her for prediabetes, hypothyroidism, vitamin D deficiency and fatigue.  He recommended that we take over her care for these particular conditions.  In regards to impaired fasting glucose she is actually been doing really well she brought her A1c down from 6.2 down to 6.0 about 4 weeks ago which is fantastic.  She says she has been trying to work on losing a little bit of weight and has lost a few pounds on her home scale.  She has been trying to work on a low carb diet.  Hypothyroidism-last TSH was around 0.44.  She is currently on levothyroxine 75 mcg daily except for Sundays when she takes an 88 mcg tab.  She has been doing well on this regimen.  She also wanted to let me know that her psychiatrist did adjust her Paxil they had increased it but was actually causing some morning somnolence so they went back down and increased her Wellbutrin instead and she feels like she is actually been doing well on that.  She just had her A1c, TSH, free T4 and free T3, CMP, and cholesterol levels done.  Her last LDL was 138, triglycerides 105 and HDL was 54.  He also saw cardiology recently and they recommended that she restart her statin which she usually takes 2 to 3 days/week.  Past Medical History:  Diagnosis Date   Allergic rhinitis, cause unspecified    Anxiety    Benign paroxysmal positional vertigo    Cancer ([facility])    basal cell carcinoma per right side of nostril   CHF (congestive heart failure) ([place]) 11/09/2016   had 2+ edema in legs with anxiety and SOB   Complication of anesthesia    has 4   degenerative discs in neck.   Depressive disorder, not elsewhere classified    Esophageal reflux    History of hiatal hernia    Hypertension    Localized osteoarthrosis not specified whether primary or secondary, lower leg    Other and unspecified hyperlipidemia    Other diseases of vocal cords    Peptic ulcer, unspecified site, unspecified as acute or chronic, without mention of hemorrhage, perforation, or obstruction    Pneumonia    bilat pneumonia 1987   Pre-diabetes    no meds,just diet controll   TMJ (temporomandibular joint disorder)    Unspecified arthropathy, hand    Unspecified asthma(493.90)    triggered with a virus    Unspecified hypothyroidism     Past Surgical History:  Procedure Laterality Date   BREAST BIOPSY  09/07/2011   High Risk Lesion   BREAST EXCISIONAL BIOPSY Left    BREAST SURGERY     LUMPECTOMY / LEFT 10/12/2011   KNEE ARTHROSCOPY     bilat    NASAL SINUS SURGERY     ROTATOR CUFF REPAIR     right    thumb surgery      left hand 45 years ago    TOTAL HIP ARTHROPLASTY Right 08/10/2014   Procedure: RIGHT TOTAL HIP ARTHROPLASTY ANTERIOR APPROACH;  Surgeon: Paralee Cancel, MD;  Location:  [facility];  Service: Orthopedics;  Laterality: Right;   TOTAL KNEE ARTHROPLASTY Left 08/26/2018   Procedure: TOTAL KNEE ARTHROPLASTY;  Surgeon: Paralee Cancel, MD;  Location: [facility];  Service: Orthopedics;  Laterality: Left;  70 mins    Family History  Problem Relation Age of Onset   Heart disease Father    Colon cancer Mother    Liver cancer Brother    Asthma Brother     Social History   Socioeconomic History   Marital status: Married    Spouse name: [name]   Number of children: 3   Years of education: 16   Highest education level: Bachelor's degree (e.g., BA, AB, BS)  Occupational History   Occupation: Retired    Comment: Pharmacist, hospital  Tobacco Use   Smoking status: Former    Packs/day: 0.30    Years: 2.00    Pack years: 0.60    Types: Cigarettes    Quit date:  02/12/1958    Years since quitting: 62.7   Smokeless tobacco: Never  Vaping Use   Vaping Use: Never used  Substance and Sexual Activity   Alcohol use: Yes    Alcohol/week: 1.0 standard drink    Types: 1 Glasses of wine per week    Comment: wine socially   Drug use: No   Sexual activity: Yes  Other Topics Concern   Not on file  Social History Narrative   Helps watch grand babys   Social Determinants of Health   Financial Resource Strain: Not on file  Food Insecurity: Not on file  Transportation Needs: Not on file  Physical Activity: Not on file  Stress: Not on file  Social Connections: Not on file  Intimate Partner Violence: Not on file    Outpatient Medications Prior to Visit  Medication Sig Dispense Refill   albuterol (PROVENTIL HFA) 108 (90 Base) MCG/ACT inhaler Inhale 2 puffs into the lungs every 6 (six) hours as needed for shortness of breath. 6.7 g 5   Ascorbic Acid (VITAMIN C WITH ROSE HIPS) 1000 MG tablet Take 1,000 mg by mouth daily.     buPROPion (WELLBUTRIN XL) 300 MG 24 hr tablet      Calcium Carb-Cholecalciferol (CALCIUM 600+D3 PO) Take 1 tablet by mouth 2 (two) times a day.     Carboxymethylcell-Hypromellose (GENTEAL OP) Place 1-2 drops into both eyes 3 (three) times daily as needed (for dry eyes).     Cholecalciferol (VITAMIN D3) 50 MCG (2000 UT) capsule Take 200 Units by mouth daily.      clonazePAM (KLONOPIN) 0.5 MG tablet Take 0.25 mg by mouth 2 (two) times daily as needed for anxiety.     famotidine (PEPCID) 20 MG tablet Take 20 mg by mouth at bedtime.      fexofenadine (ALLEGRA) 180 MG tablet Take 180 mg by mouth at bedtime.     folic acid (FOLVITE) 1 MG tablet Take 1 mg by mouth 2 (two) times daily.     hydrochlorothiazide (HYDRODIURIL) 25 MG tablet TAKE 1 TABLET BY MOUTH EVERY DAY 90 tablet 2   levothyroxine (SYNTHROID, LEVOTHROID) 75 MCG tablet Take 75 mcg by mouth See admin instructions. Take 75 mcg by mouth daily on all days except Sunday      levothyroxine (SYNTHROID, LEVOTHROID) 88 MCG tablet Take 88 mcg by mouth every Sunday.      Lido-Menthol-Methyl Sal-Camph (CBD KINGS EX) Apply topically.     losartan (COZAAR) 50 MG tablet TAKE 1 TABLET BY MOUTH EVERY DAY 90 tablet 3  Magnesium 100 MG TABS Take 150 mg by mouth 2 (two) times a day.     meloxicam (MOBIC) 15 MG tablet TAKE 1 TABLET (15 MG TOTAL) BY MOUTH EVERY MORNING. 90 tablet 1   Menthol, Topical Analgesic, (BIOFREEZE EX) Apply topically.     metroNIDAZOLE (METROGEL) 1 % gel Apply topically daily. (Patient taking differently: Apply 1 application topically as needed.) 60 g 1   pantoprazole (PROTONIX) 40 MG tablet TAKE 1 TABLET BY MOUTH EVERY DAY 90 tablet 3   PAXIL CR 25 MG 24 hr tablet Take 25 mg by mouth daily.     SYMBICORT 80-4.5 MCG/ACT inhaler TAKE 2 PUFFS BY MOUTH TWICE A DAY 30.6 each 3   PARoxetine (PAXIL-CR) 12.5 MG 24 hr tablet Take 25 mg by mouth 2 (two) times daily.     No facility-administered medications prior to visit.    Allergies  Allergen Reactions   Astelin [Azelastine Hcl] Other (See Comments)    Headache   Celebrex [Celecoxib] Other (See Comments)    Stomach issues   Lisinopril Other (See Comments) and Cough    ACE cough   Methotrexate Derivatives Other (See Comments)   Morphine Nausea Only   Moxifloxacin Other (See Comments)    REACTION: confusion, dizziness, paranoia    ROS Review of Systems    Objective:    Physical Exam Constitutional:      Appearance: Normal appearance. She is well-developed.  HENT:     Head: Normocephalic and atraumatic.  Cardiovascular:     Rate and Rhythm: Normal rate and regular rhythm.     Heart sounds: Normal heart sounds.  Pulmonary:     Effort: Pulmonary effort is normal.     Breath sounds: Normal breath sounds.  Skin:    General: Skin is warm and dry.  Neurological:     Mental Status: She is alert and oriented to person, place, and time.  Psychiatric:        Behavior: Behavior normal.    BP  107/65   Pulse 74   Ht '5\' 4"'$  (1.626 m)   Wt 183 lb (83 kg)   SpO2 98%   BMI 31.41 kg/m  Wt Readings from Last 3 Encounters:  10/28/20 183 lb (83 kg)  10/18/20 184 lb (83.5 kg)  10/10/20 184 lb 1.3 oz (83.5 kg)     Health Maintenance Due  Topic Date Due   FOOT EXAM  Never done   Zoster Vaccines- Shingrix (1 of 2) Never done   COVID-19 Vaccine (3 - Pfizer risk series) 05/13/2019   HEMOGLOBIN A1C  04/28/2020   OPHTHALMOLOGY EXAM  06/03/2020    There are no preventive care reminders to display for this patient.  Lab Results  Component Value Date   TSH 0.70 10/30/2019   Lab Results  Component Value Date   WBC 6.5 02/15/2020   HGB 12.9 02/15/2020   HCT 38.0 02/15/2020   MCV 85.0 02/15/2020   PLT 319 02/15/2020   Lab Results  Component Value Date   NA 138 10/30/2019   K 4.2 10/30/2019   CO2 27 (A) 10/30/2019   GLUCOSE 112 09/16/2019   BUN 19 10/30/2019   CREATININE 0.7 10/30/2019   BILITOT 0.4 09/16/2019   ALKPHOS 57 08/07/2018   AST 19 10/30/2019   ALT 38 (A) 10/30/2019   PROT 6.2 09/16/2019   ALBUMIN 3.9 10/30/2019   CALCIUM 9.4 10/30/2019   ANIONGAP 9 08/27/2018   Lab Results  Component Value Date   CHOL 199  10/30/2019   Lab Results  Component Value Date   HDL 55 10/30/2019   Lab Results  Component Value Date   LDLCALC 123 10/30/2019   Lab Results  Component Value Date   TRIG 162 (A) 10/30/2019   Lab Results  Component Value Date   CHOLHDL 3.6 CALC 09/18/2006   Lab Results  Component Value Date   HGBA1C 6.2 10/30/2019      Assessment & Plan:   Problem List Items Addressed This Visit       Cardiovascular and Mediastinum   Essential hypertension, benign    Blood pressure looks great today in fact is a little bit on the lower end we will keep an eye on this if it stays lower we may cut back the losartan to 25 mg.      Aortic atherosclerosis ([place])    Starting back on statin 2-3 days per week.         Endocrine   IFG (impaired  fasting glucose) - Primary    Last A1c was 6.0 about 4 weeks ago which is fantastic.  Plan to follow-up in about 5 months which will be 6 months from the last A1c.  Continue to work on [employer] and regular exercise.      Relevant Orders   CBC   Hypothyroidism    Last tsh a little suppressed. Recheck in 5 months.         Other   Vitamin D deficiency    Will monitor periodically       Encouraged to schedule her mammogram at her convenience.  No orders of the defined types were placed in this encounter.   Follow-up: Return in about 5 months (around 03/30/2021) for prediabetes and thyriod.    Beatrice Lecher, MD

## 2020-10-28 NOTE — Assessment & Plan Note (Signed)
Following with Cardiology. Plan to recheck lipids in 3 mo after restarting stsatin.

## 2020-10-28 NOTE — Assessment & Plan Note (Signed)
Last A1c was 6.0 about 4 weeks ago which is fantastic.  Plan to follow-up in about 5 months which will be 6 months from the last A1c.  Continue to work on [employer] and regular exercise.

## 2020-10-28 NOTE — Assessment & Plan Note (Signed)
Blood pressure looks great today in fact is a little bit on the lower end we will keep an eye on this if it stays lower we may cut back the losartan to 25 mg.

## 2020-10-28 NOTE — Assessment & Plan Note (Signed)
Will monitor periodically

## 2020-10-28 NOTE — Assessment & Plan Note (Signed)
Last tsh a little suppressed. Recheck in 5 months.

## 2020-10-28 NOTE — Assessment & Plan Note (Signed)
Starting back on statin 2-3 days per week.

## 2020-10-29 LAB — CBC
HCT: 40.6 % (ref 35.0–45.0)
Hemoglobin: 13.1 g/dL (ref 11.7–15.5)
MCH: 28.1 pg (ref 27.0–33.0)
MCHC: 32.3 g/dL (ref 32.0–36.0)
MCV: 87.1 fL (ref 80.0–100.0)
MPV: 11 fL (ref 7.5–12.5)
Platelets: 333 Thousand/uL (ref 140–400)
RBC: 4.66 Million/uL (ref 3.80–5.10)
RDW: 13 % (ref 11.0–15.0)
WBC: 7.2 Thousand/uL (ref 3.8–10.8)

## 2020-10-31 NOTE — Progress Notes (Signed)
Your lab work is within acceptable range and there are no concerning findings.   ?

## 2020-11-15 DIAGNOSIS — F422 Mixed obsessional thoughts and acts: Secondary | ICD-10-CM | POA: Diagnosis not present

## 2020-11-15 DIAGNOSIS — F902 Attention-deficit hyperactivity disorder, combined type: Secondary | ICD-10-CM | POA: Diagnosis not present

## 2020-11-15 DIAGNOSIS — F410 Panic disorder [episodic paroxysmal anxiety] without agoraphobia: Secondary | ICD-10-CM | POA: Diagnosis not present

## 2020-11-21 ENCOUNTER — Other Ambulatory Visit: Payer: Self-pay | Admitting: Family Medicine

## 2020-11-25 ENCOUNTER — Ambulatory Visit (INDEPENDENT_AMBULATORY_CARE_PROVIDER_SITE_OTHER): Payer: Medicare Other | Admitting: Family Medicine

## 2020-11-25 DIAGNOSIS — Z0000 Encounter for general adult medical examination without abnormal findings: Secondary | ICD-10-CM | POA: Diagnosis not present

## 2020-11-25 NOTE — Progress Notes (Addendum)
MEDICARE ANNUAL WELLNESS VISIT  11/25/2020  Telephone Visit Disclaimer This Medicare AWV was conducted by telephone due to national recommendations for restrictions regarding the COVID-19 Pandemic (e.g. social distancing).  I verified, using two identifiers, that I am speaking with [name] or their authorized healthcare agent. I discussed the limitations, risks, security, and privacy concerns of performing an evaluation and management service by telephone and the potential availability of an in-person appointment in the future. The patient expressed understanding and agreed to proceed.  Location of Patient: Home Location of Provider (nurse):  Provider home.    Subjective:    [name] is a 75 y.o. female patient of [place], Rene Kocher, Fry who had a Medicare Annual Wellness Visit today via telephone. [name] is Retired and lives with their spouse. she has 3 children. she reports that she is socially active and does interact with friends/family regularly. she is minimally physically active and enjoys spending time with .  Patient Care Team: [name] Marry, Fry as PCP - General (Family Medicine) [name] as PCP - Cardiology (Cardiology) [name] Stain, Fry as Attending Physician (Pulmonary Disease) Al[place]imer, Legrand Como, Fry as Attending Physician (Endocrinology) [name] Hough, Fry as Referring Physician (Ophthalmology)  Advanced Directives 11/25/2020 04/21/2019 08/26/2018 08/22/2018 10/22/2017 08/10/2014 08/10/2014  Does Patient Have a Medical Advance Directive? Yes Yes Yes Yes No Yes Yes  Type of Paramedic of [place];Living will [place];Living will [place];Living will [place];Living will - [place];Living will [place];Living will  Does patient want to make changes to medical advance directive? No - Patient declined No - Patient  declined No - Patient declined - - No - Patient declined -  Copy of [place] in Chart? No - copy requested No - copy requested No - copy requested - - No - copy requested No - copy requested  Would patient like information on creating a medical advance directive? - - - - No - Patient declined - [facility] Utilization Over the Past 12 Months: # of hospitalizations or ER visits: 0 # of surgeries: 0  Review of Systems    Patient reports that her overall health is unchanged compared to last year.  History obtained from chart review and the patient  Patient Reported Readings (BP, Pulse, CBG, Weight, etc) none  Pain Assessment Pain : No/denies pain     Current Medications & Allergies (verified) Allergies as of 11/25/2020       Reactions   Astelin [azelastine Hcl] Other (See Comments)   Headache   Celebrex [celecoxib] Other (See Comments)   Stomach issues   Lisinopril Other (See Comments), Cough   ACE cough   Methotrexate Derivatives Other (See Comments)   Morphine Nausea Only   Moxifloxacin Other (See Comments)   REACTION: confusion, dizziness, paranoia        Medication List        Accurate as of November 25, 2020  2:51 PM. If you have any questions, ask your nurse or doctor.          albuterol 108 (90 Base) MCG/ACT inhaler Commonly known as: Proventil HFA Inhale 2 puffs into the lungs every 6 (six) hours as needed for shortness of breath.   BIOFREEZE EX Apply topically.   buPROPion 300 MG 24 hr tablet Commonly known as: WELLBUTRIN XL   CALCIUM 600+D3 PO Take 1 tablet by mouth 2 (two) times  a day.   CBD KINGS EX Apply topically.   clonazePAM 0.5 MG tablet Commonly known as: KLONOPIN Take 0.25 mg by mouth 2 (two) times daily as needed for anxiety.   famotidine 20 MG tablet Commonly known as: PEPCID Take 20 mg by mouth at bedtime.   fexofenadine 180 MG tablet Commonly known as: ALLEGRA Take 180 mg by mouth at bedtime.    folic acid 1 MG tablet Commonly known as: FOLVITE Take 1 mg by mouth 2 (two) times daily.   GENTEAL OP Place 1-2 drops into both eyes 3 (three) times daily as needed (for dry eyes).   hydrochlorothiazide 25 MG tablet Commonly known as: HYDRODIURIL TAKE 1 TABLET BY MOUTH EVERY DAY   levothyroxine 75 MCG tablet Commonly known as: SYNTHROID Take 75 mcg by mouth See admin instructions. Take 75 mcg by mouth daily on all days except Sunday   levothyroxine 88 MCG tablet Commonly known as: SYNTHROID Take 88 mcg by mouth every Sunday.   losartan 50 MG tablet Commonly known as: COZAAR TAKE 1 TABLET BY MOUTH EVERY DAY   Magnesium 100 MG Tabs Take 150 mg by mouth 2 (two) times a day.   meloxicam 15 MG tablet Commonly known as: MOBIC TAKE 1 TABLET BY MOUTH EVERY MORNING.   metroNIDAZOLE 1 % gel Commonly known as: Metrogel Apply topically daily. What changed:  how much to take when to take this reasons to take this   pantoprazole 40 MG tablet Commonly known as: PROTONIX TAKE 1 TABLET BY MOUTH EVERY DAY   Paxil CR 25 MG 24 hr tablet Generic drug: PARoxetine Take 25 mg by mouth daily. Taking 12.5 mg- her counselor's recommendation   Symbicort 80-4.5 MCG/ACT inhaler Generic drug: budesonide-formoterol TAKE 2 PUFFS BY MOUTH TWICE A DAY   vitamin C with rose hips 1000 MG tablet Take 1,000 mg by mouth daily.   Vitamin D3 50 MCG (2000 UT) capsule Take 200 Units by mouth daily.        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   Depressive disorder, not elsewhere classified    Esophageal reflux    History of hiatal hernia     Hypertension    Localized osteoarthrosis not specified whether primary or secondary, lower leg    Other and unspecified hyperlipidemia    Other diseases of vocal cords    Peptic ulcer, unspecified site, unspecified as acute or chronic, without mention of hemorrhage, perforation, or obstruction    Pneumonia    bilat pneumonia 1987   Pre-diabetes    no meds,just diet controll   TMJ (temporomandibular joint disorder)    Unspecified arthropathy, hand    Unspecified asthma(493.90)    triggered with a virus    Unspecified hypothyroidism    Past Surgical History:  Procedure Laterality Date   BREAST BIOPSY  09/07/2011   High Risk Lesion   BREAST EXCISIONAL BIOPSY Left    BREAST SURGERY     LUMPECTOMY / LEFT 10/12/2011   KNEE ARTHROSCOPY     bilat    NASAL SINUS SURGERY     ROTATOR CUFF REPAIR  right    thumb surgery      left hand 45 years ago    [place] Right 08/10/2014   Procedure: RIGHT TOTAL HIP ARTHROPLASTY ANTERIOR APPROACH;  Surgeon: Paralee Cancel, Fry;  Location: [facility];  Service: Orthopedics;  Laterality: Right;   TOTAL KNEE ARTHROPLASTY Left 08/26/2018   Procedure: TOTAL KNEE ARTHROPLASTY;  Surgeon: Paralee Cancel, Fry;  Location: [facility];  Service: Orthopedics;  Laterality: Left;  70 mins   Family History  Problem Relation Age of Onset   Heart disease Father    Colon cancer Mother    Liver cancer Brother    Asthma Brother    Social History   Socioeconomic History   Marital status: Married    Spouse name: [name]   Number of children: 3   Years of education: 16   Highest education level: Bachelor's degree (e.g., BA, AB, BS)  Occupational History   Occupation: Retired    Comment: Pharmacist, hospital  Tobacco Use   Smoking status: Former    Packs/day: 0.30    Years: 2.00    Pack years: 0.60    Types: Cigarettes    Quit date: 02/12/1958    Years since quitting: 62.8   Smokeless tobacco: Never  Vaping Use   Vaping Use: Never used  Substance and Sexual Activity    Alcohol use: Yes    Alcohol/week: 1.0 standard drink    Types: 1 Glasses of wine per week    Comment: wine socially   Drug use: No   Sexual activity: Yes  Other Topics Concern   Not on file  Social History Narrative   Lives with her husband. She has three children. She enjoys spending time with her grandchildren and talking to her friends. When she does have some free time, she enjoys crocheting.    Social Determinants of Health   Financial Resource Strain: Low Risk    Difficulty of Paying Living Expenses: Not hard at all  Food Insecurity: No Food Insecurity   Worried About Charity fundraiser in the Last Year: Never true   [place] in the Last Year: Never true  Transportation Needs: No Transportation Needs   Lack of Transportation (Medical): No   Lack of Transportation (Non-Medical): No  Physical Activity: Inactive   Days of Exercise per Week: 0 days   Minutes of Exercise per Session: 0 min  Stress: No Stress Concern Present   Feeling of Stress : Not at all  Social Connections: Socially Integrated   Frequency of Communication with Friends and Family: More than three times a week   Frequency of Social Gatherings with Friends and Family: More than three times a week   Attends Religious Services: More than 4 times per year   Active Member of [employer] or Organizations: Yes   Attends Archivist Meetings: Never   Marital Status: Married    Activities of Daily Living In your present state of health, do you have any difficulty performing the following activities: 11/25/2020  Hearing? N  Vision? N  Difficulty concentrating or making decisions? N  Walking or climbing stairs? N  Dressing or bathing? N  Doing errands, shopping? N  Preparing Food and eating ? N  Using the Toilet? N  In the past six months, have you accidently leaked urine? N  Do you have problems with loss of bowel control? N  Managing your Medications? N  Managing your Finances? N  Housekeeping  or managing your Housekeeping? N  Some recent data might be hidden    Patient Education/ Literacy How often do you need to have someone help you when you read instructions, pamphlets, or other written materials from your doctor or pharmacy?: 1 - Never What is the last grade level you completed in school?: Bachelor's degree  Exercise Current Exercise Habits: Home exercise routine, Type of exercise: Other - see comments (bike), Time (Minutes): 20, Frequency (Times/Week): 1, Weekly Exercise (Minutes/Week): 20, Intensity: Mild, Exercise limited by: None identified  Diet Patient reports consuming 3 meals a day and 3 snack(s) a day Patient reports that her primary diet is: Regular Patient reports that she does have regular access to food.   Depression Screen PHQ 2/9 Scores 11/25/2020 04/21/2019 11/19/2016 04/05/2015 01/16/2013  PHQ - 2 Score 0 0 0 0 0  Exception Documentation - Medical reason - - -     Fall Risk Fall Risk  11/25/2020 04/21/2019 11/19/2016 09/03/2016 04/05/2015  Falls in the past year? 0 0 No No No  Comment - - - Emmi Telephone Survey: data to providers prior to load -  Number falls in past yr: 0 - - - -  Injury with Fall? 0 - - - -  Risk for fall due to : No Fall Risks Impaired balance/gait - - -  Follow up Falls evaluation completed Falls prevention discussed - - -     Objective:  [name] seemed alert and oriented and she participated appropriately during our telephone visit.  Blood Pressure Weight BMI  BP Readings from Last 3 Encounters:  10/28/20 107/65  10/10/20 110/62  07/05/20 137/77   Wt Readings from Last 3 Encounters:  10/28/20 183 lb (83 kg)  10/18/20 184 lb (83.5 kg)  10/10/20 184 lb 1.3 oz (83.5 kg)   BMI Readings from Last 1 Encounters:  10/28/20 31.41 kg/m    *Unable to obtain current vital signs, weight, and BMI due to telephone visit type  Hearing/Vision  Hoyle Sauer did not seem to have difficulty with hearing/understanding during the  telephone conversation Reports that she has had a formal eye exam by an eye care professional within the past year Reports that she has not had a formal hearing evaluation within the past year *Unable to fully assess hearing and vision during telephone visit type  Cognitive Function: 6CIT Screen 11/25/2020 04/21/2019  What Year? 0 points 0 points  What month? 0 points 0 points  What time? 0 points 0 points  Count back from 20 0 points 0 points  Months in reverse 0 points 0 points  Repeat phrase 0 points 0 points  Total Score 0 0   (Normal:0-7, Significant for Dysfunction: >8)  Normal Cognitive Function Screening: Yes   Immunization & Health Maintenance Record Immunization History  Administered Date(s) Administered   Fluad Quad(high Dose 65+) 12/24/2018, 12/28/2019   Influenza Split 11/07/2011, 01/12/2013   Influenza Whole 02/12/2005, 12/02/2007, 11/24/2008   Influenza, High Dose Seasonal PF 11/19/2016, 12/10/2017   Influenza,inj,Quad PF,6+ Mos 01/11/2015   Influenza-Unspecified 11/07/2011, 01/12/2013, 11/12/2013, 01/11/2015, 12/24/2018   PFIZER(Purple Top)SARS-COV-2 Vaccination 03/20/2019, 04/15/2019   Pneumococcal Conjugate-13 04/26/2014   Pneumococcal Polysaccharide-23 02/12/2001, 11/27/2001, 11/28/2006, 11/07/2011   Td 02/12/1998, 06/13/1998   Tdap 09/23/2012    Health Maintenance  Topic Date Due   COVID-19 Vaccine (3 - Pfizer risk series) 12/11/2020 (Originally 05/13/2019)   FOOT EXAM  12/12/2020 (Originally 11/17/1955)   Zoster Vaccines- Shingrix (1 of 2) 02/25/2021 (Originally 11/18/1964)   INFLUENZA VACCINE  05/12/2021 (Originally 09/12/2020)   HEMOGLOBIN A1C  12/13/2020   OPHTHALMOLOGY EXAM  06/12/2021   TETANUS/TDAP  09/24/2022   COLONOSCOPY (Pts 45-52yrs Insurance coverage will need to be confirmed)  08/05/2024   DEXA SCAN  Completed   Hepatitis C Screening  Completed   HPV VACCINES  Aged Out       Assessment  This is a routine wellness examination for [employer].  Health Maintenance: Due or Overdue There are no preventive care reminders to display for this patient.   [name] does not need a referral for Community Assistance: Care Management:   no Social Work:    no Prescription Assistance:  no Nutrition/Diabetes Education:  no   Plan:  Personalized Goals  Goals Addressed               This Visit's Progress     Patient Stated (pt-stated)        11/25/2020 AWV Goal: Exercise for General Health  Patient will verbalize understanding of the benefits of increased physical activity: Exercising regularly is important. It will improve your overall fitness, flexibility, and endurance. Regular exercise also will improve your overall health. It can help you control your weight, reduce stress, and improve your bone density. Over the next year, patient will increase physical activity as tolerated with a goal of at least 150 minutes of moderate physical activity per week.  You can tell that you are exercising at a moderate intensity if your heart starts beating faster and you start breathing faster but can still hold a conversation. Moderate-intensity exercise ideas include: Walking 1 mile (1.6 km) in about 15 minutes Biking Hiking Golfing Dancing Water aerobics Patient will verbalize understanding of everyday activities that increase physical activity by providing examples like the following: Yard work, such as: Sales promotion account executive Gardening Washing windows or floors Patient will be able to explain general safety guidelines for exercising:  Before you start a new exercise program, talk with your health care provider. Do not exercise so much that you hurt yourself, feel dizzy, or get very short of breath. Wear comfortable clothes and wear shoes with good support. Drink plenty of water while you exercise to prevent dehydration or heat  stroke. Work out until your breathing and your heartbeat get faster.        Personalized Health Maintenance & Screening Recommendations  Influenza vaccine Shingles vaccine  Lung Cancer Screening Recommended: no (Low Dose CT Chest recommended if Age 31-80 years, 30 pack-year currently smoking OR have quit w/in past 15 years) Hepatitis C Screening recommended: no HIV Screening recommended: no  Advanced Directives: Written information was not prepared per patient's request.  Referrals & Orders No orders of the defined types were placed in this encounter.   Follow-up Plan Follow-up with [name] Marry, Fry as planned Schedule your flu shot and shingles vaccine. Medicare wellness visit in one year. Patient will access AVS on my chart.   I have personally reviewed and noted the following in the patient's chart:   Medical and social history Use of alcohol, tobacco or illicit drugs  Current medications and supplements Functional ability and status Nutritional status Physical activity Advanced directives List of other physicians Hospitalizations, surgeries, and ER visits in previous 12 months Vitals Screenings to include cognitive, depression, and falls Referrals and appointments  In addition, I have reviewed and discussed with [name] certain preventive protocols, quality metrics, and best practice recommendations. A written personalized care plan for preventive  services as well as general preventive health recommendations is available and can be mailed to the patient at her request.      Tinnie Gens, RN  11/25/2020

## 2020-11-25 NOTE — Patient Instructions (Addendum)
[place] Maintenance Summary and Written Plan of Care  [name] ,  Thank you for allowing me to perform your Medicare Annual Wellness Visit and for your ongoing commitment to your health.   Health Maintenance & Immunization History Health Maintenance  Topic Date Due   COVID-19 Vaccine (3 - Pfizer risk series) 12/11/2020 (Originally 05/13/2019)   FOOT EXAM  12/12/2020 (Originally 11/17/1955)   Zoster Vaccines- Shingrix (1 of 2) 02/25/2021 (Originally 11/18/1964)   INFLUENZA VACCINE  05/12/2021 (Originally 09/12/2020)   HEMOGLOBIN A1C  12/13/2020   OPHTHALMOLOGY EXAM  06/12/2021   TETANUS/TDAP  09/24/2022   COLONOSCOPY (Pts 45-28yrs Insurance coverage will need to be confirmed)  08/05/2024   DEXA SCAN  Completed   Hepatitis C Screening  Completed   HPV VACCINES  Aged Out   Immunization History  Administered Date(s) Administered   Fluad Quad(high Dose 65+) 12/24/2018, 12/28/2019   Influenza Split 11/07/2011, 01/12/2013   Influenza Whole 02/12/2005, 12/02/2007, 11/24/2008   Influenza, High Dose Seasonal PF 11/19/2016, 12/10/2017   Influenza,inj,Quad PF,6+ Mos 01/11/2015   Influenza-Unspecified 11/07/2011, 01/12/2013, 11/12/2013, 01/11/2015, 12/24/2018   PFIZER(Purple Top)SARS-COV-2 Vaccination 03/20/2019, 04/15/2019   Pneumococcal Conjugate-13 04/26/2014   Pneumococcal Polysaccharide-23 02/12/2001, 11/27/2001, 11/28/2006, 11/07/2011   Td 02/12/1998, 06/13/1998   Tdap 09/23/2012    These are the patient goals that we discussed:  Goals Addressed               This Visit's Progress     Patient Stated (pt-stated)        11/25/2020 AWV Goal: Exercise for General Health  Patient will verbalize understanding of the benefits of increased physical activity: Exercising regularly is important. It will improve your overall fitness, flexibility, and endurance. Regular exercise also will improve your overall health. It can help you control your weight,  reduce stress, and improve your bone density. Over the next year, patient will increase physical activity as tolerated with a goal of at least 150 minutes of moderate physical activity per week.  You can tell that you are exercising at a moderate intensity if your heart starts beating faster and you start breathing faster but can still hold a conversation. Moderate-intensity exercise ideas include: Walking 1 mile (1.6 km) in about 15 minutes Biking Hiking Golfing Dancing Water aerobics Patient will verbalize understanding of everyday activities that increase physical activity by providing examples like the following: Yard work, such as: Sales promotion account executive Gardening Washing windows or floors Patient will be able to explain general safety guidelines for exercising:  Before you start a new exercise program, talk with your health care provider. Do not exercise so much that you hurt yourself, feel dizzy, or get very short of breath. Wear comfortable clothes and wear shoes with good support. Drink plenty of water while you exercise to prevent dehydration or heat stroke. Work out until your breathing and your heartbeat get faster.          This is a list of Health Maintenance Items that are overdue or due now: Influenza vaccine Shingles vaccine  Orders/Referrals Placed Today: No orders of the defined types were placed in this encounter.  (Contact our referral department at [phone] if you have not spoken with someone about your referral appointment within the next 5 days)    Follow-up Plan Follow-up with Hali Marry, MD as planned Schedule your flu shot and shingles vaccine. Medicare wellness visit in one  year. Patient will access AVS on my chart.      Health Maintenance, Female Adopting a healthy lifestyle and getting preventive care are important in promoting health and wellness.  Ask your health care provider about: The right schedule for you to have regular tests and exams. Things you can do on your own to prevent diseases and keep yourself healthy. What should I know about diet, weight, and exercise? Eat a healthy diet  Eat a diet that includes plenty of vegetables, fruits, low-fat dairy products, and lean protein. Do not eat a lot of foods that are high in solid fats, added sugars, or sodium. Maintain a healthy weight Body mass index (BMI) is used to identify weight problems. It estimates body fat based on height and weight. Your health care provider can help determine your BMI and help you achieve or maintain a healthy weight. Get regular exercise Get regular exercise. This is one of the most important things you can do for your health. Most adults should: Exercise for at least 150 minutes each week. The exercise should increase your heart rate and make you sweat (moderate-intensity exercise). Do strengthening exercises at least twice a week. This is in addition to the moderate-intensity exercise. Spend less time sitting. Even light physical activity can be beneficial. Watch cholesterol and blood lipids Have your blood tested for lipids and cholesterol at 77 years of age, then have this test every 5 years. Have your cholesterol levels checked more often if: Your lipid or cholesterol levels are high. You are older than 77 years of age. You are at high risk for heart disease. What should I know about cancer screening? Depending on your health history and family history, you may need to have cancer screening at various ages. This may include screening for: Breast cancer. Cervical cancer. Colorectal cancer. Skin cancer. Lung cancer. What should I know about heart disease, diabetes, and high blood pressure? Blood pressure and heart disease High blood pressure causes heart disease and increases the risk of stroke. This is more likely to develop in people who have  high blood pressure readings, are of African descent, or are overweight. Have your blood pressure checked: Every 3-5 years if you are 72-40 years of age. Every year if you are 66 years old or older. Diabetes Have regular diabetes screenings. This checks your fasting blood sugar level. Have the screening done: Once every three years after age 16 if you are at a normal weight and have a low risk for diabetes. More often and at a younger age if you are overweight or have a high risk for diabetes. What should I know about preventing infection? Hepatitis B If you have a higher risk for hepatitis B, you should be screened for this virus. Talk with your health care provider to find out if you are at risk for hepatitis B infection. Hepatitis C Testing is recommended for: Everyone born from [birth year] through 1967. Anyone with known risk factors for hepatitis C. Sexually transmitted infections (STIs) Get screened for STIs, including gonorrhea and chlamydia, if: You are sexually active and are younger than 77 years of age. You are older than 77 years of age and your health care provider tells you that you are at risk for this type of infection. Your sexual activity has changed since you were last screened, and you are at increased risk for chlamydia or gonorrhea. Ask your health care provider if you are at risk. Ask your health care provider about whether you are  at high risk for HIV. Your health care provider may recommend a prescription medicine to help prevent HIV infection. If you choose to take medicine to prevent HIV, you should first get tested for HIV. You should then be tested every 3 months for as long as you are taking the medicine. Pregnancy If you are about to stop having your period (premenopausal) and you may become pregnant, seek counseling before you get pregnant. Take 400 to 800 micrograms (mcg) of folic acid every day if you become pregnant. Ask for birth control (contraception) if you  want to prevent pregnancy. Osteoporosis and menopause Osteoporosis is a disease in which the bones lose minerals and strength with aging. This can result in bone fractures. If you are 37 years old or older, or if you are at risk for osteoporosis and fractures, ask your health care provider if you should: Be screened for bone loss. Take a calcium or vitamin D supplement to lower your risk of fractures. Be given hormone replacement therapy (HRT) to treat symptoms of menopause. Follow these instructions at home: Lifestyle Do not use any products that contain nicotine or tobacco, such as cigarettes, e-cigarettes, and chewing tobacco. If you need help quitting, ask your health care provider. Do not use street drugs. Do not share needles. Ask your health care provider for help if you need support or information about quitting drugs. Alcohol use Do not drink alcohol if: Your health care provider tells you not to drink. You are pregnant, may be pregnant, or are planning to become pregnant. If you drink alcohol: Limit how much you use to 0-1 drink a day. Limit intake if you are breastfeeding. Be aware of how much alcohol is in your drink. In the [place] one drink equals one 12 oz bottle of beer (355 mL), one 5 oz glass of wine (148 mL), or one 1 oz glass of hard liquor (44 mL). General instructions Schedule regular health, dental, and eye exams. Stay current with your vaccines. Tell your health care provider if: You often feel depressed. You have ever been abused or do not feel safe at home. Summary Adopting a healthy lifestyle and getting preventive care are important in promoting health and wellness. Follow your health care provider's instructions about healthy diet, exercising, and getting tested or screened for diseases. Follow your health care provider's instructions on monitoring your cholesterol and blood pressure. This information is not intended to replace advice given to you by your  health care provider. Make sure you discuss any questions you have with your health care provider. Document Revised: 04/08/2020 Document Reviewed: 01/22/2018 Elsevier Patient Education  2022 [employer].

## 2020-11-30 ENCOUNTER — Other Ambulatory Visit: Payer: Self-pay | Admitting: Pulmonary Disease

## 2020-12-01 ENCOUNTER — Telehealth: Payer: Self-pay

## 2020-12-01 NOTE — Telephone Encounter (Signed)
[name] called and states she has left side temporal arteritis. She complains of shooting pain on the left temporal. She states she was diagnosed a year ago or so by Dr Assunta [facility]. I advised she needs an appointment. We do not have any openings. She is requesting a round of steroids. She states she can not take ibuprofen or naproxen because it upsets her stomach.     Note from [facility] -    ?temporal arteritis.  Check CMP and sed rate.  CBC normal. ?early shingles (call if rash develops; would start Valtrex) Followup with Family Doctor in about 5 days.

## 2020-12-01 NOTE — Telephone Encounter (Signed)
Patient scheduled for the morning with Jade.

## 2020-12-02 ENCOUNTER — Telehealth: Payer: Self-pay | Admitting: Family Medicine

## 2020-12-02 ENCOUNTER — Other Ambulatory Visit: Payer: Self-pay

## 2020-12-02 ENCOUNTER — Encounter: Payer: Self-pay | Admitting: Physician Assistant

## 2020-12-02 ENCOUNTER — Ambulatory Visit (INDEPENDENT_AMBULATORY_CARE_PROVIDER_SITE_OTHER): Payer: Medicare Other | Admitting: Physician Assistant

## 2020-12-02 VITALS — BP 123/65 | HR 73 | Temp 98.7°F | Ht 64.0 in | Wt 182.0 lb

## 2020-12-02 DIAGNOSIS — R519 Headache, unspecified: Secondary | ICD-10-CM

## 2020-12-02 DIAGNOSIS — R5381 Other malaise: Secondary | ICD-10-CM

## 2020-12-02 LAB — POCT INFLUENZA A/B
Influenza A, POC: NEGATIVE
Influenza B, POC: NEGATIVE

## 2020-12-02 MED ORDER — PREDNISONE 20 MG PO TABS: ORAL_TABLET | ORAL | 0 refills | Status: DC

## 2020-12-02 NOTE — Telephone Encounter (Signed)
Pt called. Please send her scripts to [facility].  Thank you.

## 2020-12-02 NOTE — Progress Notes (Signed)
Subjective:    Patient ID: [name], female    DOB: 1946/01/15, 75 y.o.   MRN: [id number]  HPI Pt is a 75 yo female who presents to the clinic with left temple tenderness and general malaise. She has had the temple tenderness for 72 hours. Per pt hx of temporal arteritis in right temple. No vision changes, cough, ST. Her ears are popping some. No SOB or breathing issues. No fever or body aches.  .. Active Ambulatory Problems    Diagnosis Date Noted   Hypothyroidism 09/24/2006   HYPERLIPIDEMIA 09/23/2006   DEPRESSION 09/24/2006   VERTIGO, BENIGN PAROXYSMAL POSITION 09/24/2006   Allergic rhinitis 11/05/2006   VOCAL CORD DISORDER 11/05/2006   Asthma with allergic rhinitis 11/05/2006   GERD 09/24/2006   Rheumatoid arthritis with rheumatoid factor ([place]) 11/30/2008   LOC OSTEOARTHROS NOT SPEC PRIM/SEC LOWER LEG 03/19/2007   SOMNOLENCE 11/25/2009   POSTMENOPAUSAL STATUS 11/30/2008   Abnormal liver enzymes 10/25/2010   CARRIER/SUSPECTED CARRIER GROUP B STREPTOCOCCUS 11/17/2010   Obesity (BMI 30-39.9) 05/03/2011   Abnormal mammogram with microcalcification 10/04/2011   Atypical ductal hyperplasia of breast 10/29/2011   OCD (obsessive compulsive disorder) 05/09/2012   Oral herpes 06/23/2012   Vitreous detachment 09/23/2012   IFG (impaired fasting glucose) 01/25/2014   Essential hypertension, benign 05/26/2014   Primary osteoarthritis of right hip 05/27/2014   S/P right THA, AA 08/10/2014   Venous stasis ulcer of left lower extremity ([place]) 03/03/2015   Tendon nodule 01/16/2016   Fatty liver 11/09/2016   Vitamin D deficiency 06/19/2016   Chronic fatigue 06/19/2016   Alopecia 06/19/2016   Aortic atherosclerosis ([place]) 09/26/2017   S/P left TKA 08/26/2018   Overweight (BMI 25.0-29.9) 08/27/2018   Pain in right knee 01/03/2018   Lumbar degenerative disc disease 11/07/2018   Cervical pain 11/07/2018   Drug-induced constipation 05/25/2019   Diarrhea 05/25/2019   Lower abdominal  pain 05/25/2019   Blood in stool 05/25/2019   Internal hemorrhoids 05/25/2019   Family history of colon cancer 05/25/2019   Right renal stone 09/21/2019   Bullous pemphigoid 11/06/2019   Educated about COVID-19 virus infection 11/09/2019   Occipital pain 12/28/2019   Numbness and tingling sensation of skin 12/29/2019   Spondylosis without myelopathy or radiculopathy, cervical region 12/29/2019   Resolved Ambulatory Problems    Diagnosis Date Noted   EUSTACHIAN TUBE DYSFUNCTION, LEFT 04/13/2009   Acute maxillary sinusitis 01/19/2009   PEPTIC ULCER DISEASE 11/05/2006   ARTHRITIS, HANDS, BILATERAL 12/22/2007   Precordial pain 06/23/2008   Chest pain 10/25/2010   Dyspnea 10/25/2010   Dyspnea 06/26/2013   ACE-inhibitor cough 05/26/2014   Preoperative clearance 08/03/2014   Acute sinusitis 04/12/2015   Pain of left hand 05/22/2017   Acute cystitis 09/09/2019   Type 2 diabetes mellitus with complication, without long-term current use of insulin ([employer]) 11/09/2019   Past Medical History:  Diagnosis Date   Allergic rhinitis, cause unspecified    Anxiety    Cancer (HCC)    CHF (congestive heart failure) ([facility] [place] [address]) 53/64/6803   Complication of anesthesia    History of hiatal hernia    Hypertension    Pneumonia    Pre-diabetes    TMJ (temporomandibular joint disorder)    Unspecified asthma(493.90)    Unspecified hypothyroidism      Review of Systems See HPI.     Objective:   Physical Exam Vitals reviewed.  Constitutional:      Appearance: She is well-developed.  HENT:     Head: Normocephalic.  Comments: Left temple and into temporal parietal tenderness to palpation.     Mouth/Throat:     Mouth: Mucous membranes are moist.  Eyes:     General: No visual field deficit.    Extraocular Movements: Extraocular movements intact.     Pupils: Pupils are equal, round, and reactive to light.  Cardiovascular:     Rate and Rhythm: Normal rate and regular rhythm.  Pulmonary:      Effort: Pulmonary effort is normal.     Breath sounds: Normal breath sounds.  Musculoskeletal:     Cervical back: Normal range of motion and neck supple. No rigidity.  Lymphadenopathy:     Cervical: No cervical adenopathy.  Skin:    Coloration: Skin is pale.  Neurological:     Mental Status: She is alert and oriented to person, place, and time.     Cranial Nerves: No cranial nerve deficit, dysarthria or facial asymmetry.     Sensory: No sensory deficit.     Gait: Gait normal.  Psychiatric:        Mood and Affect: Mood normal.          Assessment & Plan:  Marland KitchenMarland KitchenHalena was seen today for headache.  Diagnoses and all orders for this visit:  Left temporal headache -     Sed Rate (ESR) -     C-reactive protein -     CBC with Differential/Platelet -     COMPLETE METABOLIC PANEL WITH GFR -     POCT Influenza A/B -     Novel Coronavirus, NAA ([facility]) -     predniSONE (DELTASONE) 20 MG tablet; Take 3 tablets for 7 days then 2 tablets for 7 days then 1 tablet for 7 days, then 1/2 tablet for 7 days.  Temple tenderness -     Sed Rate (ESR) -     C-reactive protein -     CBC with Differential/Platelet -     COMPLETE METABOLIC PANEL WITH GFR -     POCT Influenza A/B -     Novel Coronavirus, NAA ([facility]) -     predniSONE (DELTASONE) 20 MG tablet; Take 3 tablets for 7 days then 2 tablets for 7 days then 1 tablet for 7 days, then 1/2 tablet for 7 days.  Malaise -     POCT Influenza A/B -     Novel Coronavirus, NAA ([facility])  Per patient had hx of right temporal arteritis but I cannot find documentation of this in EMR.  No vision changes which is reassuring. Will get labs.  She does have left temple tenderness.  Will start prednisone. We can stop if no concern.  Consider temporal biopsy/us if labs abnormal.  Go to ED with any vision changes.  She does have a feeling of general malaise.  Flu negative Covid pending. She is vaccinated for Covid x2.

## 2020-12-02 NOTE — Progress Notes (Signed)
ESR is nice and low which makes the dangerous temporal arteritis very low. I sent prednisone but now I want you to take differently.  Take 48m twice a day for 5 days. That will help with your inflammation but you do not need the long duration that is needed for temporal arteritis.

## 2020-12-02 NOTE — Telephone Encounter (Signed)
Sent!

## 2020-12-03 LAB — COMPLETE METABOLIC PANEL WITH GFR
AG Ratio: 1.8 (calc) (ref 1.0–2.5)
ALT: 39 U/L — ABNORMAL HIGH (ref 6–29)
AST: 18 U/L (ref 10–35)
Albumin: 4.1 g/dL (ref 3.6–5.1)
Alkaline phosphatase (APISO): 54 U/L (ref 37–153)
BUN: 21 mg/dL (ref 7–25)
CO2: 28 mmol/L (ref 20–32)
Calcium: 9.7 mg/dL (ref 8.6–10.4)
Chloride: 103 mmol/L (ref 98–110)
Creat: 0.89 mg/dL (ref 0.60–1.00)
Globulin: 2.3 g/dL (calc) (ref 1.9–3.7)
Glucose, Bld: 103 mg/dL (ref 65–139)
Potassium: 4.2 mmol/L (ref 3.5–5.3)
Sodium: 138 mmol/L (ref 135–146)
Total Bilirubin: 0.5 mg/dL (ref 0.2–1.2)
Total Protein: 6.4 g/dL (ref 6.1–8.1)
eGFR: 67 mL/min/{1.73_m2} (ref >=60)

## 2020-12-03 LAB — CBC WITH DIFFERENTIAL/PLATELET
Absolute Monocytes: 610 cells/uL (ref 200–950)
Basophils Absolute: 67 cells/uL (ref 0–200)
Basophils Relative: 1.1 %
Eosinophils Absolute: 445 cells/uL (ref 15–500)
Eosinophils Relative: 7.3 %
HCT: 40 % (ref 35.0–45.0)
Hemoglobin: 13 g/dL (ref 11.7–15.5)
Lymphs Abs: 1531 cells/uL (ref 850–3900)
MCH: 28.1 pg (ref 27.0–33.0)
MCHC: 32.5 g/dL (ref 32.0–36.0)
MCV: 86.6 fL (ref 80.0–100.0)
MPV: 10.7 fL (ref 7.5–12.5)
Monocytes Relative: 10 %
Neutro Abs: 3447 cells/uL (ref 1500–7800)
Neutrophils Relative %: 56.5 %
Platelets: 330 10*3/uL (ref 140–400)
RBC: 4.62 10*6/uL (ref 3.80–5.10)
RDW: 13 % (ref 11.0–15.0)
Total Lymphocyte: 25.1 %
WBC: 6.1 10*3/uL (ref 3.8–10.8)

## 2020-12-03 LAB — SEDIMENTATION RATE: Sed Rate: 19 mm/h (ref 0–30)

## 2020-12-03 LAB — SARS-COV-2, NAA 2 DAY TAT

## 2020-12-03 LAB — C-REACTIVE PROTEIN: CRP: 6.4 mg/L (ref <8.0)

## 2020-12-03 LAB — NOVEL CORONAVIRUS, NAA: SARS-CoV-2, NAA: NOT DETECTED

## 2020-12-05 NOTE — Progress Notes (Signed)
Negative for covid. How are you feeling today?

## 2020-12-06 ENCOUNTER — Telehealth: Payer: Self-pay

## 2020-12-06 NOTE — Telephone Encounter (Signed)
Saw results and comments on MyChart.   "I'm feeling much better. The left temple is still very sensitive to touch." She thinks its from the clenching of her mouth guard at night.   "I'm pleased with all the results of my treatments and have spoken to my dentist."

## 2020-12-13 DIAGNOSIS — F410 Panic disorder [episodic paroxysmal anxiety] without agoraphobia: Secondary | ICD-10-CM | POA: Diagnosis not present

## 2020-12-13 DIAGNOSIS — F422 Mixed obsessional thoughts and acts: Secondary | ICD-10-CM | POA: Diagnosis not present

## 2020-12-13 DIAGNOSIS — F902 Attention-deficit hyperactivity disorder, combined type: Secondary | ICD-10-CM | POA: Diagnosis not present

## 2020-12-14 ENCOUNTER — Ambulatory Visit (INDEPENDENT_AMBULATORY_CARE_PROVIDER_SITE_OTHER): Payer: Medicare Other | Admitting: Family Medicine

## 2020-12-14 ENCOUNTER — Other Ambulatory Visit: Payer: Self-pay

## 2020-12-14 VITALS — Temp 98.6°F

## 2020-12-14 DIAGNOSIS — Z23 Encounter for immunization: Secondary | ICD-10-CM | POA: Diagnosis not present

## 2021-01-17 DIAGNOSIS — F902 Attention-deficit hyperactivity disorder, combined type: Secondary | ICD-10-CM | POA: Diagnosis not present

## 2021-01-17 DIAGNOSIS — F410 Panic disorder [episodic paroxysmal anxiety] without agoraphobia: Secondary | ICD-10-CM | POA: Diagnosis not present

## 2021-01-17 DIAGNOSIS — F422 Mixed obsessional thoughts and acts: Secondary | ICD-10-CM | POA: Diagnosis not present

## 2021-02-21 DIAGNOSIS — F902 Attention-deficit hyperactivity disorder, combined type: Secondary | ICD-10-CM | POA: Diagnosis not present

## 2021-02-21 DIAGNOSIS — F410 Panic disorder [episodic paroxysmal anxiety] without agoraphobia: Secondary | ICD-10-CM | POA: Diagnosis not present

## 2021-02-21 DIAGNOSIS — F422 Mixed obsessional thoughts and acts: Secondary | ICD-10-CM | POA: Diagnosis not present

## 2021-03-07 DIAGNOSIS — H524 Presbyopia: Secondary | ICD-10-CM | POA: Diagnosis not present

## 2021-03-07 DIAGNOSIS — H2513 Age-related nuclear cataract, bilateral: Secondary | ICD-10-CM | POA: Diagnosis not present

## 2021-03-07 DIAGNOSIS — H04123 Dry eye syndrome of bilateral lacrimal glands: Secondary | ICD-10-CM | POA: Diagnosis not present

## 2021-03-18 ENCOUNTER — Other Ambulatory Visit: Payer: Self-pay | Admitting: Family Medicine

## 2021-03-21 DIAGNOSIS — F410 Panic disorder [episodic paroxysmal anxiety] without agoraphobia: Secondary | ICD-10-CM | POA: Diagnosis not present

## 2021-03-21 DIAGNOSIS — F902 Attention-deficit hyperactivity disorder, combined type: Secondary | ICD-10-CM | POA: Diagnosis not present

## 2021-03-21 DIAGNOSIS — F422 Mixed obsessional thoughts and acts: Secondary | ICD-10-CM | POA: Diagnosis not present

## 2021-03-24 ENCOUNTER — Other Ambulatory Visit: Payer: Self-pay

## 2021-03-24 DIAGNOSIS — E038 Other specified hypothyroidism: Secondary | ICD-10-CM

## 2021-03-24 DIAGNOSIS — E669 Obesity, unspecified: Secondary | ICD-10-CM

## 2021-03-24 DIAGNOSIS — R7301 Impaired fasting glucose: Secondary | ICD-10-CM

## 2021-03-28 DIAGNOSIS — E669 Obesity, unspecified: Secondary | ICD-10-CM | POA: Diagnosis not present

## 2021-03-28 DIAGNOSIS — E038 Other specified hypothyroidism: Secondary | ICD-10-CM | POA: Diagnosis not present

## 2021-03-28 DIAGNOSIS — R7301 Impaired fasting glucose: Secondary | ICD-10-CM | POA: Diagnosis not present

## 2021-03-29 NOTE — Progress Notes (Signed)
Hi [name], your complete metabolic panel overall looks good.  The ALT liver enzyme which has been slightly elevated is still just borderline elevated but it is a little better.  Down to 64.  That is great.  We will plan to recheck the liver again in 6 months.  A1c is still in the prediabetes range.  Just continue to work on healthy diet eating more vegetables and less carbs and sugars.  LDL cholesterol slightly elevated at 127.  Please call the lab and add a TSH.

## 2021-03-30 ENCOUNTER — Encounter: Payer: Self-pay | Admitting: Family Medicine

## 2021-03-30 ENCOUNTER — Ambulatory Visit (INDEPENDENT_AMBULATORY_CARE_PROVIDER_SITE_OTHER): Payer: Medicare Other | Admitting: Family Medicine

## 2021-03-30 ENCOUNTER — Other Ambulatory Visit: Payer: Self-pay

## 2021-03-30 VITALS — BP 122/67 | HR 72 | Resp 18 | Ht 64.0 in | Wt 186.0 lb

## 2021-03-30 DIAGNOSIS — Z1231 Encounter for screening mammogram for malignant neoplasm of breast: Secondary | ICD-10-CM | POA: Diagnosis not present

## 2021-03-30 DIAGNOSIS — Z780 Asymptomatic menopausal state: Secondary | ICD-10-CM

## 2021-03-30 DIAGNOSIS — R5383 Other fatigue: Secondary | ICD-10-CM | POA: Diagnosis not present

## 2021-03-30 DIAGNOSIS — R7301 Impaired fasting glucose: Secondary | ICD-10-CM | POA: Diagnosis not present

## 2021-03-30 DIAGNOSIS — I10 Essential (primary) hypertension: Secondary | ICD-10-CM | POA: Diagnosis not present

## 2021-03-30 DIAGNOSIS — E038 Other specified hypothyroidism: Secondary | ICD-10-CM

## 2021-03-30 NOTE — Assessment & Plan Note (Signed)
TSH still pending on the labs that should hopefully be back to tomorrow so we can just make sure that it looks appropriate and its not contributing to any of her fatigue but she also has a history of chronic fatigue.

## 2021-03-30 NOTE — Assessment & Plan Note (Signed)
A1c looks great today.  Great job.  Continue current regimen.  Follow-up in 6 months.

## 2021-03-30 NOTE — Progress Notes (Signed)
Established Patient Office Visit  Subjective:  Patient ID: [name], female    DOB: 10-03-1945  Age: 76 y.o. MRN: [id number]  CC:  Chief Complaint  Patient presents with   Follow-up    5 month    Tingling    In hands and feet 2 months.    Fatigue    Sleeping a lot.     HPI Lucky Alverson presents for   Hypertension- Pt denies chest pain, SOB, dizziness, or heart palpitations.  Taking meds as directed w/o problems.  Denies medication side effects.    Impaired fasting glucose-no increased thirst or urination. No symptoms consistent with hypoglycemia.  A1c 2 days ago was 6.0 which was improved from last time.  Hypothyroidism - Taking medication regularly in the AM away from food and vitamins, etc. No recent change to skin, hair, or energy levels.  She has noticed some tingling in her hands and feet over the last couple months.  She also reports that she is just felt more tired.  She says at night she tends to get revved up and feels productive and has to make herself go to bed by 1 AM.  Said by the time she gets up in the morning she just feels like she is dragging and it is hard to get out of bed and she says she feels tired really until about lunchtime.  She does take about 6 of her medications including her 2 blood pressure pills first thing in the morning.  Past Medical History:  Diagnosis Date   Allergic rhinitis, cause unspecified    Anxiety    Benign paroxysmal positional vertigo    Cancer (HCC)    basal cell carcinoma per right side of nostril   CHF (congestive heart failure) ([place]) 11/09/2016   had 2+ edema in legs with anxiety and SOB   Complication of anesthesia    has 4  degenerative discs in neck.   Depressive disorder, not elsewhere classified    Esophageal reflux    History of hiatal hernia    Hypertension    Localized osteoarthrosis not specified whether primary or secondary, lower leg    Other and unspecified hyperlipidemia    Other diseases of  vocal cords    Peptic ulcer, unspecified site, unspecified as acute or chronic, without mention of hemorrhage, perforation, or obstruction    Pneumonia    bilat pneumonia 1987   Pre-diabetes    no meds,just diet controll   TMJ (temporomandibular joint disorder)    Unspecified arthropathy, hand    Unspecified asthma(493.90)    triggered with a virus    Unspecified hypothyroidism     Past Surgical History:  Procedure Laterality Date   BREAST BIOPSY  09/07/2011   High Risk Lesion   BREAST EXCISIONAL BIOPSY Left    BREAST SURGERY     LUMPECTOMY / LEFT 10/12/2011   KNEE ARTHROSCOPY     bilat    NASAL SINUS SURGERY     ROTATOR CUFF REPAIR     right    thumb surgery      left hand 45 years ago    TOTAL HIP ARTHROPLASTY Right 08/10/2014   Procedure: RIGHT TOTAL HIP ARTHROPLASTY ANTERIOR APPROACH;  Surgeon: Paralee Cancel, MD;  Location: [facility];  Service: Orthopedics;  Laterality: Right;   TOTAL KNEE ARTHROPLASTY Left 08/26/2018   Procedure: TOTAL KNEE ARTHROPLASTY;  Surgeon: Paralee Cancel, MD;  Location: [facility];  Service: Orthopedics;  Laterality: Left;  70 mins  Family History  °Problem Relation Age of Onset  ° Heart disease Father   ° Colon cancer Mother   ° Liver cancer Brother   ° Asthma Brother   ° ° °Social History  ° °Socioeconomic History  ° Marital status: Married  °  Spouse name: Peter  ° Number of children: 3  ° Years of education: 16  ° Highest education level: Bachelor's degree (e.g., BA, AB, BS)  °Occupational History  ° Occupation: Retired  °  Comment: teacher  °Tobacco Use  ° Smoking status: Former  °  Packs/day: 0.30  °  Years: 2.00  °  Pack years: 0.60  °  Types: Cigarettes  °  Quit date: 02/12/1958  °  Years since quitting: 63.1  ° Smokeless tobacco: Never  °Vaping Use  ° Vaping Use: Never used  °Substance and Sexual Activity  ° Alcohol use: Yes  °  Alcohol/week: 1.0 standard drink  °  Types: 1 Glasses of wine per week  °  Comment: wine socially  ° Drug use: No  ° Sexual  activity: Yes  °Other Topics Concern  ° Not on file  °Social History Narrative  ° Lives with her husband. She has three children. She enjoys spending time with her grandchildren and talking to her friends. When she does have some free time, she enjoys crocheting.   ° °Social Determinants of Health  ° °Financial Resource Strain: Low Risk   ° Difficulty of Paying Living Expenses: Not hard at all  °Food Insecurity: No Food Insecurity  ° Worried About Running Out of Food in the Last Year: Never true  ° Ran Out of Food in the Last Year: Never true  °Transportation Needs: No Transportation Needs  ° Lack of Transportation (Medical): No  ° Lack of Transportation (Non-Medical): No  °Physical Activity: Inactive  ° Days of Exercise per Week: 0 days  ° Minutes of Exercise per Session: 0 min  °Stress: No Stress Concern Present  ° Feeling of Stress : Not at all  °Social Connections: Socially Integrated  ° Frequency of Communication with Friends and Family: More than three times a week  ° Frequency of Social Gatherings with Friends and Family: More than three times a week  ° Attends Religious Services: More than 4 times per year  ° Active Member of Clubs or Organizations: Yes  ° Attends Club or Organization Meetings: Never  ° Marital Status: Married  °Intimate Partner Violence: Not At Risk  ° Fear of Current or Ex-Partner: No  ° Emotionally Abused: No  ° Physically Abused: No  ° Sexually Abused: No  ° ° °Outpatient Medications Prior to Visit  °Medication Sig Dispense Refill  ° albuterol (VENTOLIN HFA) 108 (90 Base) MCG/ACT inhaler INHALE 2 PUFFS INTO THE LUNGS EVERY 6 (SIX) HOURS AS NEEDED FOR SHORTNESS OF BREATH. 8.5 each 5  ° Ascorbic Acid (VITAMIN C WITH ROSE HIPS) 1000 MG tablet Take 1,000 mg by mouth daily.    ° buPROPion (WELLBUTRIN XL) 300 MG 24 hr tablet     ° Calcium Carb-Cholecalciferol (CALCIUM 600+D3 PO) Take 1 tablet by mouth 2 (two) times a day.    ° Carboxymethylcell-Hypromellose (GENTEAL OP) Place 1-2 drops into  both eyes 3 (three) times daily as needed (for dry eyes).    ° Cholecalciferol (VITAMIN D3) 50 MCG (2000 UT) capsule Take 200 Units by mouth daily.     ° clonazePAM (KLONOPIN) 0.5 MG tablet Take 0.25 mg by mouth 2 (two) times daily as needed for   anxiety.    ° famotidine (PEPCID) 20 MG tablet Take 20 mg by mouth at bedtime.     ° fexofenadine (ALLEGRA) 180 MG tablet Take 180 mg by mouth at bedtime.    ° folic acid (FOLVITE) 1 MG tablet Take 1 mg by mouth 2 (two) times daily.    ° hydrochlorothiazide (HYDRODIURIL) 25 MG tablet TAKE 1 TABLET BY MOUTH EVERY DAY 90 tablet 1  ° levothyroxine (SYNTHROID, LEVOTHROID) 75 MCG tablet Take 75 mcg by mouth See admin instructions. Take 75 mcg by mouth daily on all days except Sunday    ° levothyroxine (SYNTHROID, LEVOTHROID) 88 MCG tablet Take 88 mcg by mouth every Sunday.     ° Lido-Menthol-Methyl Sal-Camph (CBD KINGS EX) Apply topically.    ° losartan (COZAAR) 50 MG tablet TAKE 1 TABLET BY MOUTH EVERY DAY 90 tablet 3  ° Magnesium 100 MG TABS Take 150 mg by mouth 2 (two) times a day.    ° meloxicam (MOBIC) 15 MG tablet TAKE 1 TABLET BY MOUTH EVERY MORNING. 90 tablet 1  ° Menthol, Topical Analgesic, (BIOFREEZE EX) Apply topically.    ° metroNIDAZOLE (METROGEL) 1 % gel Apply topically daily. (Patient taking differently: Apply 1 application topically as needed.) 60 g 1  ° pantoprazole (PROTONIX) 40 MG tablet TAKE 1 TABLET BY MOUTH EVERY DAY 90 tablet 3  ° PAXIL CR 12.5 MG 24 hr tablet Take 12.5 mg by mouth daily.    ° SYMBICORT 80-4.5 MCG/ACT inhaler TAKE 2 PUFFS BY MOUTH TWICE A DAY 30.6 each 3  ° PAXIL CR 25 MG 24 hr tablet Take 12.5 mg by mouth daily. Taking 12.5 mg- her counselor's recommendation    ° predniSONE (DELTASONE) 20 MG tablet Take 3 tablets for 7 days then 2 tablets for 7 days then 1 tablet for 7 days, then 1/2 tablet for 7 days. 81 tablet 0  ° °No facility-administered medications prior to visit.  ° ° °Allergies  °Allergen Reactions  ° Astelin [Azelastine Hcl] Other  (See Comments)  °  Headache  ° Celebrex [Celecoxib] Other (See Comments)  °  Stomach issues  ° Lisinopril Other (See Comments) and Cough  °  ACE cough  ° Methotrexate Derivatives Other (See Comments)  ° Morphine Nausea Only  ° Moxifloxacin Other (See Comments)  °  REACTION: confusion, dizziness, paranoia  ° ° °ROS °Review of Systems ° °  °Objective:  °  °Physical Exam ° °BP 122/67    Pulse 72    Resp 18    Ht 5' 4" (1.626 m)    Wt 186 lb (84.4 kg)    SpO2 98%    BMI 31.93 kg/m²  °Wt Readings from Last 3 Encounters:  °03/30/21 186 lb (84.4 kg)  °12/02/20 182 lb (82.6 kg)  °10/28/20 183 lb (83 kg)  ° ° ° °Health Maintenance Due  °Topic Date Due  ° Zoster Vaccines- Shingrix (1 of 2) Never done  ° ° °There are no preventive care reminders to display for this patient. ° °Lab Results  °Component Value Date  ° TSH 0.70 10/30/2019  ° °Lab Results  °Component Value Date  ° WBC 6.1 12/02/2020  ° HGB 13.0 12/02/2020  ° HCT 40.0 12/02/2020  ° MCV 86.6 12/02/2020  ° PLT 330 12/02/2020  ° °Lab Results  °Component Value Date  ° NA 139 03/28/2021  ° K 4.2 03/28/2021  ° CO2 26 03/28/2021  ° GLUCOSE 95 03/28/2021  ° BUN 20 03/28/2021  ° CREATININE 0.84 03/28/2021  °   BILITOT 0.5 03/28/2021   ALKPHOS 57 08/07/2018   AST 17 03/28/2021   ALT 32 (H) 03/28/2021   PROT 6.3 03/28/2021   ALBUMIN 3.9 10/30/2019   CALCIUM 9.4 03/28/2021   ANIONGAP 9 08/27/2018   EGFR 72 03/28/2021   Lab Results  Component Value Date   CHOL 207 (H) 03/28/2021   Lab Results  Component Value Date   HDL 56 03/28/2021   Lab Results  Component Value Date   LDLCALC 127 (H) 03/28/2021   Lab Results  Component Value Date   TRIG 129 03/28/2021   Lab Results  Component Value Date   CHOLHDL 3.7 03/28/2021   Lab Results  Component Value Date   HGBA1C 6.0 (H) 03/28/2021      Assessment & Plan:   Problem List Items Addressed This Visit       Cardiovascular and Mediastinum   Essential hypertension, benign    Well controlled. Continue  current regimen.  We discussed maybe following her blood pressure at home to see if it could actually be going low since it has always looked good here.  I am also can have her move her losartan to the evening and separated from the HCTZ to see if that helps with some of the morning fatigue.        Endocrine   IFG (impaired fasting glucose) - Primary    A1c looks great today.  Great job.  Continue current regimen.  Follow-up in 6 months.      Hypothyroidism    TSH still pending on the labs that should hopefully be back to tomorrow so we can just make sure that it looks appropriate and its not contributing to any of her fatigue but she also has a history of chronic fatigue.      Other Visit Diagnoses     Encounter for screening mammogram for malignant neoplasm of breast       Relevant Orders   MM Digital Screening   Other fatigue       Post-menopausal       Relevant Orders   DG Bone Density       Fatigue-unclear etiology it mostly affects her in the morning.  I do not make sure that its not a medication side effect.  So I did encourage her to check her blood pressures at home they look great here today but it could be that she is actually running a little lower at home.  We also discussed moving her losartan to the evening and separating it from the HCTZ since she takes both of them in the morning just to see if that helps.  He says she tends to get a lot more energy at the end of the day in the evening and then it makes it hard for her to go to bed.  She tries to make herself go to bed around 1 AM.  Also due for repeat bone density in April she would like to have her mammogram scheduled at the same time.  No orders of the defined types were placed in this encounter.   Follow-up: Return in about 6 months (around 09/27/2021) for Hypertension.    Beatrice Lecher, MD

## 2021-03-30 NOTE — Patient Instructions (Addendum)
Please check your BP at home over the next couple of weeks sometime in the morning after you have sat for about 5 minutes.  If your blood pressures are running under 110 let me know.  I also want you to try moving your losartan to the evening and separating it from the hydrochlorothiazide to see if that makes a difference in how you feel.

## 2021-03-30 NOTE — Assessment & Plan Note (Addendum)
Well controlled. Continue current regimen.  We discussed maybe following her blood pressure at home to see if it could actually be going low since it has always looked good here.  I am also can have her move her losartan to the evening and separated from the HCTZ to see if that helps with some of the morning fatigue.

## 2021-03-31 LAB — LIPID PANEL
Cholesterol: 207 mg/dL — ABNORMAL HIGH (ref <200)
HDL: 56 mg/dL (ref >=50)
LDL Cholesterol (Calc): 127 mg/dL (calc) — ABNORMAL HIGH
Non-HDL Cholesterol (Calc): 151 mg/dL (calc) — ABNORMAL HIGH (ref <130)
Total CHOL/HDL Ratio: 3.7 (calc) (ref <5.0)
Triglycerides: 129 mg/dL (ref <150)

## 2021-03-31 LAB — COMPLETE METABOLIC PANEL WITHOUT GFR
AG Ratio: 1.7 (calc) (ref 1.0–2.5)
ALT: 32 U/L — ABNORMAL HIGH (ref 6–29)
AST: 17 U/L (ref 10–35)
Albumin: 4 g/dL (ref 3.6–5.1)
Alkaline phosphatase (APISO): 52 U/L (ref 37–153)
BUN: 20 mg/dL (ref 7–25)
CO2: 26 mmol/L (ref 20–32)
Calcium: 9.4 mg/dL (ref 8.6–10.4)
Chloride: 104 mmol/L (ref 98–110)
Creat: 0.84 mg/dL (ref 0.60–1.00)
Globulin: 2.3 g/dL (ref 1.9–3.7)
Glucose, Bld: 95 mg/dL (ref 65–99)
Potassium: 4.2 mmol/L (ref 3.5–5.3)
Sodium: 139 mmol/L (ref 135–146)
Total Bilirubin: 0.5 mg/dL (ref 0.2–1.2)
Total Protein: 6.3 g/dL (ref 6.1–8.1)
eGFR: 72 mL/min/{1.73_m2}

## 2021-03-31 LAB — HEMOGLOBIN A1C
Hgb A1c MFr Bld: 6 % of total Hgb — ABNORMAL HIGH (ref <5.7)
Mean Plasma Glucose: 126 mg/dL
eAG (mmol/L): 7 mmol/L

## 2021-03-31 LAB — TSH: TSH: 0.32 mIU/L — ABNORMAL LOW (ref 0.40–4.50)

## 2021-03-31 LAB — T4, FREE: Free T4: 1.2 ng/dL (ref 0.8–1.8)

## 2021-03-31 LAB — T3, FREE: T3, Free: 3 pg/mL (ref 2.3–4.2)

## 2021-03-31 NOTE — Progress Notes (Signed)
HI [name], TSH is shifted down just slightly.  We need to adjust your medication slightly.  It looks like you are actually a little overmedicated.  Suggest verify how you are taking your medication and we can make an adjustment.

## 2021-04-05 NOTE — Progress Notes (Signed)
Lets go back up to 88 mcg 5 days a week and 75 mcg 2 days a week.  Repeat each week.  And then plan to recheck your level again in 6 weeks.  If you need [place] to send an updated prescription just let us know.

## 2021-04-18 DIAGNOSIS — F410 Panic disorder [episodic paroxysmal anxiety] without agoraphobia: Secondary | ICD-10-CM | POA: Diagnosis not present

## 2021-04-18 DIAGNOSIS — F422 Mixed obsessional thoughts and acts: Secondary | ICD-10-CM | POA: Diagnosis not present

## 2021-04-18 DIAGNOSIS — F902 Attention-deficit hyperactivity disorder, combined type: Secondary | ICD-10-CM | POA: Diagnosis not present

## 2021-04-19 ENCOUNTER — Telehealth: Payer: Self-pay | Admitting: *Deleted

## 2021-04-19 NOTE — Telephone Encounter (Signed)
Called and lvm advising pt that we needed to move her appt time to 1120. Asked that she return call if she has any questions.  ?

## 2021-04-20 ENCOUNTER — Encounter: Payer: Self-pay | Admitting: Family Medicine

## 2021-04-20 ENCOUNTER — Ambulatory Visit: Payer: Medicare Other | Admitting: Family Medicine

## 2021-04-20 ENCOUNTER — Other Ambulatory Visit: Payer: Self-pay

## 2021-04-20 ENCOUNTER — Ambulatory Visit (INDEPENDENT_AMBULATORY_CARE_PROVIDER_SITE_OTHER): Payer: Medicare Other | Admitting: Family Medicine

## 2021-04-20 VITALS — BP 123/57 | HR 79 | Temp 98.1°F | Resp 20 | Ht 64.0 in | Wt 186.0 lb

## 2021-04-20 DIAGNOSIS — J40 Bronchitis, not specified as acute or chronic: Secondary | ICD-10-CM | POA: Diagnosis not present

## 2021-04-20 DIAGNOSIS — J329 Chronic sinusitis, unspecified: Secondary | ICD-10-CM | POA: Diagnosis not present

## 2021-04-20 MED ORDER — BENZONATATE 200 MG PO CAPS: 200.0000 mg | ORAL_CAPSULE | Freq: Two times a day (BID) | ORAL | 0 refills | Status: DC | PRN

## 2021-04-20 MED ORDER — AZITHROMYCIN 250 MG PO TABS: ORAL_TABLET | ORAL | 0 refills | Status: AC

## 2021-04-20 MED ORDER — PREDNISONE 20 MG PO TABS: 40.0000 mg | ORAL_TABLET | Freq: Every day | ORAL | 0 refills | Status: DC

## 2021-04-20 NOTE — Progress Notes (Signed)
Acute Office Visit  Subjective:    Patient ID: [name], female    DOB: 05-19-1945, 76 y.o.   MRN: [id number]  Chief Complaint  Patient presents with   Nasal Congestion    Sneezing, low grade fever, productive cough, headache for 5 days. Covid test on Wednesday Negative.     HPI Patient is in today for Sneezing, low grade fever, productive cough, headache for 5 days. Covid test on Wednesday Negative   Past Medical History:  Diagnosis Date   Allergic rhinitis, cause unspecified    Anxiety    Benign paroxysmal positional vertigo    Cancer (HCC)    basal cell carcinoma per right side of nostril   CHF (congestive heart failure) ([place]) 11/09/2016   had 2+ edema in legs with anxiety and SOB   Complication of anesthesia    has 4  degenerative discs in neck.   Depressive disorder, not elsewhere classified    Esophageal reflux    History of hiatal hernia    Hypertension    Localized osteoarthrosis not specified whether primary or secondary, lower leg    Other and unspecified hyperlipidemia    Other diseases of vocal cords    Peptic ulcer, unspecified site, unspecified as acute or chronic, without mention of hemorrhage, perforation, or obstruction    Pneumonia    bilat pneumonia 1987   Pre-diabetes    no meds,just diet controll   TMJ (temporomandibular joint disorder)    Unspecified arthropathy, hand    Unspecified asthma(493.90)    triggered with a virus    Unspecified hypothyroidism     Past Surgical History:  Procedure Laterality Date   BREAST BIOPSY  09/07/2011   High Risk Lesion   BREAST EXCISIONAL BIOPSY Left    BREAST SURGERY     LUMPECTOMY / LEFT 10/12/2011   KNEE ARTHROSCOPY     bilat    NASAL SINUS SURGERY     ROTATOR CUFF REPAIR     right    thumb surgery      left hand 45 years ago    TOTAL HIP ARTHROPLASTY Right 08/10/2014   Procedure: RIGHT TOTAL HIP ARTHROPLASTY ANTERIOR APPROACH;  Surgeon: Paralee Cancel, MD;  Location: [facility];  Service:  Orthopedics;  Laterality: Right;   TOTAL KNEE ARTHROPLASTY Left 08/26/2018   Procedure: TOTAL KNEE ARTHROPLASTY;  Surgeon: Paralee Cancel, MD;  Location: [facility];  Service: Orthopedics;  Laterality: Left;  70 mins    Family History  Problem Relation Age of Onset   Heart disease Father    Colon cancer Mother    Liver cancer Brother    Asthma Brother     Social History   Socioeconomic History   Marital status: Married    Spouse name: [name]   Number of children: 3   Years of education: 16   Highest education level: Bachelor's degree (e.g., BA, AB, BS)  Occupational History   Occupation: Retired    Comment: Pharmacist, hospital  Tobacco Use   Smoking status: Former    Packs/day: 0.30    Years: 2.00    Pack years: 0.60    Types: Cigarettes    Quit date: 02/12/1958    Years since quitting: 63.2   Smokeless tobacco: Never  Vaping Use   Vaping Use: Never used  Substance and Sexual Activity   Alcohol use: Yes    Alcohol/week: 1.0 standard drink    Types: 1 Glasses of wine per week    Comment: wine socially  Drug use: No   Sexual activity: Yes  Other Topics Concern   Not on file  Social History Narrative   Lives with her husband. She has three children. She enjoys spending time with her grandchildren and talking to her friends. When she does have some free time, she enjoys crocheting.    Social Determinants of Health   Financial Resource Strain: Low Risk    Difficulty of Paying Living Expenses: Not hard at all  Food Insecurity: No Food Insecurity   Worried About Charity fundraiser in the Last Year: Never true   [place] in the Last Year: Never true  Transportation Needs: No Transportation Needs   Lack of Transportation (Medical): No   Lack of Transportation (Non-Medical): No  Physical Activity: Inactive   Days of Exercise per Week: 0 days   Minutes of Exercise per Session: 0 min  Stress: No Stress Concern Present   Feeling of Stress : Not at all  Social Connections:  Socially Integrated   Frequency of Communication with Friends and Family: More than three times a week   Frequency of Social Gatherings with Friends and Family: More than three times a week   Attends Religious Services: More than 4 times per year   Active Member of [employer] or Organizations: Yes   Attends Archivist Meetings: Never   Marital Status: Married  Human resources officer Violence: Not At Risk   Fear of Current or Ex-Partner: No   Emotionally Abused: No   Physically Abused: No   Sexually Abused: No    Outpatient Medications Prior to Visit  Medication Sig Dispense Refill   albuterol (VENTOLIN HFA) 108 (90 Base) MCG/ACT inhaler INHALE 2 PUFFS INTO THE LUNGS EVERY 6 (SIX) HOURS AS NEEDED FOR SHORTNESS OF BREATH. 8.5 each 5   Ascorbic Acid (VITAMIN C WITH ROSE HIPS) 1000 MG tablet Take 1,000 mg by mouth daily.     buPROPion (WELLBUTRIN XL) 300 MG 24 hr tablet      Calcium Carb-Cholecalciferol (CALCIUM 600+D3 PO) Take 1 tablet by mouth 2 (two) times a day.     Carboxymethylcell-Hypromellose (GENTEAL OP) Place 1-2 drops into both eyes 3 (three) times daily as needed (for dry eyes).     Cholecalciferol (VITAMIN D3) 50 MCG (2000 UT) capsule Take 200 Units by mouth daily.      clonazePAM (KLONOPIN) 0.5 MG tablet Take 0.25 mg by mouth 2 (two) times daily as needed for anxiety.     famotidine (PEPCID) 20 MG tablet Take 20 mg by mouth at bedtime.      fexofenadine (ALLEGRA) 180 MG tablet Take 180 mg by mouth at bedtime.     folic acid (FOLVITE) 1 MG tablet Take 1 mg by mouth 2 (two) times daily.     hydrochlorothiazide (HYDRODIURIL) 25 MG tablet TAKE 1 TABLET BY MOUTH EVERY DAY 90 tablet 1   levothyroxine (SYNTHROID, LEVOTHROID) 75 MCG tablet Take 75 mcg by mouth See admin instructions. Take 75 mcg by mouth daily on all days except Sunday     levothyroxine (SYNTHROID, LEVOTHROID) 88 MCG tablet Take 88 mcg by mouth every Sunday.      Lido-Menthol-Methyl Sal-Camph (CBD KINGS EX) Apply  topically.     losartan (COZAAR) 50 MG tablet TAKE 1 TABLET BY MOUTH EVERY DAY 90 tablet 3   Magnesium 100 MG TABS Take 150 mg by mouth 2 (two) times a day.     meloxicam (MOBIC) 15 MG tablet TAKE 1 TABLET BY MOUTH  EVERY MORNING. 90 tablet 1   Menthol, Topical Analgesic, (BIOFREEZE EX) Apply topically.     metroNIDAZOLE (METROGEL) 1 % gel Apply topically daily. (Patient taking differently: Apply 1 application. topically as needed.) 60 g 1   pantoprazole (PROTONIX) 40 MG tablet TAKE 1 TABLET BY MOUTH EVERY DAY 90 tablet 3   PAXIL CR 12.5 MG 24 hr tablet Take 12.5 mg by mouth daily.     SYMBICORT 80-4.5 MCG/ACT inhaler TAKE 2 PUFFS BY MOUTH TWICE A DAY 30.6 each 3   No facility-administered medications prior to visit.    Allergies  Allergen Reactions   Astelin [Azelastine Hcl] Other (See Comments)    Headache   Celebrex [Celecoxib] Other (See Comments)    Stomach issues   Lisinopril Other (See Comments) and Cough    ACE cough   Methotrexate Derivatives Other (See Comments)   Morphine Nausea Only   Moxifloxacin Other (See Comments)    REACTION: confusion, dizziness, paranoia    Review of Systems     Objective:    Physical Exam Constitutional:      Appearance: She is well-developed.  HENT:     Head: Normocephalic and atraumatic.     Right Ear: External ear normal.     Left Ear: External ear normal.     Nose: Nose normal.  Eyes:     Conjunctiva/sclera: Conjunctivae normal.     Pupils: Pupils are equal, round, and reactive to light.  Neck:     Thyroid: No thyromegaly.  Cardiovascular:     Rate and Rhythm: Normal rate and regular rhythm.     Heart sounds: Normal heart sounds.  Pulmonary:     Effort: Pulmonary effort is normal. No respiratory distress.     Breath sounds: Wheezing present. No rhonchi.     Comments: Mild expiratory wheeze in both upper lungs. Musculoskeletal:     Cervical back: Neck supple.  Lymphadenopathy:     Cervical: No cervical adenopathy.  Skin:     General: Skin is warm and dry.  Neurological:     Mental Status: She is alert and oriented to person, place, and time.    BP (!) 123/57    Pulse 79    Temp 98.1 F (36.7 C) (Temporal)    Resp 20    Ht '5\' 4"'  (1.626 m)    Wt 186 lb (84.4 kg)    SpO2 96%    BMI 31.93 kg/m  Wt Readings from Last 3 Encounters:  04/20/21 186 lb (84.4 kg)  03/30/21 186 lb (84.4 kg)  12/02/20 182 lb (82.6 kg)    Health Maintenance Due  Topic Date Due   Zoster Vaccines- Shingrix (1 of 2) Never done   COVID-19 Vaccine (3 - Pfizer risk series) 05/13/2019    There are no preventive care reminders to display for this patient.   Lab Results  Component Value Date   TSH 0.32 (L) 03/28/2021   Lab Results  Component Value Date   WBC 6.1 12/02/2020   HGB 13.0 12/02/2020   HCT 40.0 12/02/2020   MCV 86.6 12/02/2020   PLT 330 12/02/2020   Lab Results  Component Value Date   NA 139 03/28/2021   K 4.2 03/28/2021   CO2 26 03/28/2021   GLUCOSE 95 03/28/2021   BUN 20 03/28/2021   CREATININE 0.84 03/28/2021   BILITOT 0.5 03/28/2021   ALKPHOS 57 08/07/2018   AST 17 03/28/2021   ALT 32 (H) 03/28/2021   PROT 6.3 03/28/2021  ALBUMIN 3.9 10/30/2019   CALCIUM 9.4 03/28/2021   ANIONGAP 9 08/27/2018   EGFR 72 03/28/2021   Lab Results  Component Value Date   CHOL 207 (H) 03/28/2021   Lab Results  Component Value Date   HDL 56 03/28/2021   Lab Results  Component Value Date   LDLCALC 127 (H) 03/28/2021   Lab Results  Component Value Date   TRIG 129 03/28/2021   Lab Results  Component Value Date   CHOLHDL 3.7 03/28/2021   Lab Results  Component Value Date   HGBA1C 6.0 (H) 03/28/2021       Assessment & Plan:   Problem List Items Addressed This Visit   None Visit Diagnoses     Sinobronchitis    -  Primary   Relevant Medications   predniSONE (DELTASONE) 20 MG tablet   benzonatate (TESSALON) 200 MG capsule   azithromycin (ZITHROMAX) 250 MG tablet      Sinobronchitis-we will treat  with prednisone and Tessalon Perles.  She is also taking Mucinex.  If she is not improving in the next couple days or if she feels like she is getting worse then okay to start the azithromycin.  She agrees to give it a couple days before starting the antibiotic.  Meds ordered this encounter  Medications   predniSONE (DELTASONE) 20 MG tablet    Sig: Take 2 tablets (40 mg total) by mouth daily with breakfast.    Dispense:  10 tablet    Refill:  0   benzonatate (TESSALON) 200 MG capsule    Sig: Take 1 capsule (200 mg total) by mouth 2 (two) times daily as needed for cough.    Dispense:  30 capsule    Refill:  0   azithromycin (ZITHROMAX) 250 MG tablet    Sig: 2 Ttabs PO on Day 1, then one a day x 4 days.    Dispense:  6 tablet    Refill:  0     Beatrice Lecher, MD

## 2021-04-27 ENCOUNTER — Ambulatory Visit (INDEPENDENT_AMBULATORY_CARE_PROVIDER_SITE_OTHER): Payer: Medicare Other

## 2021-04-27 ENCOUNTER — Other Ambulatory Visit: Payer: Self-pay

## 2021-04-27 DIAGNOSIS — Z1231 Encounter for screening mammogram for malignant neoplasm of breast: Secondary | ICD-10-CM

## 2021-05-08 ENCOUNTER — Other Ambulatory Visit: Payer: Self-pay | Admitting: Family Medicine

## 2021-05-12 ENCOUNTER — Emergency Department (INDEPENDENT_AMBULATORY_CARE_PROVIDER_SITE_OTHER)
Admission: EM | Admit: 2021-05-12 | Discharge: 2021-05-12 | Disposition: A | Discharge status: Home / Self Care | Payer: Medicare Other | Source: Home / Self Care | Attending: Family Medicine | Admitting: Family Medicine

## 2021-05-12 ENCOUNTER — Emergency Department (INDEPENDENT_AMBULATORY_CARE_PROVIDER_SITE_OTHER): Payer: Medicare Other

## 2021-05-12 ENCOUNTER — Encounter: Payer: Self-pay | Admitting: Emergency Medicine

## 2021-05-12 DIAGNOSIS — R059 Cough, unspecified: Secondary | ICD-10-CM | POA: Diagnosis not present

## 2021-05-12 DIAGNOSIS — R058 Other specified cough: Secondary | ICD-10-CM | POA: Diagnosis not present

## 2021-05-12 DIAGNOSIS — J40 Bronchitis, not specified as acute or chronic: Secondary | ICD-10-CM

## 2021-05-12 MED ORDER — PREDNISONE 20 MG PO TABS: ORAL_TABLET | ORAL | 0 refills | Status: DC

## 2021-05-12 MED ORDER — DOXYCYCLINE HYCLATE 100 MG PO CAPS: ORAL_CAPSULE | ORAL | 0 refills | Status: DC

## 2021-05-12 MED ORDER — BENZONATATE 200 MG PO CAPS: ORAL_CAPSULE | ORAL | 0 refills | Status: DC

## 2021-05-12 NOTE — Discharge Instructions (Signed)
Take plain guaifenesin ('1200mg'$  extended release tabs such as Mucinex) twice daily, with plenty of water, for cough and congestion. Get adequate rest.   ?Also recommend using saline nasal spray several times daily and saline nasal irrigation (AYR is a common brand).  Use Flonase nasal spray as needed. ?Stop all antihistamines (Allegra, etc) for now. ?  ?

## 2021-05-12 NOTE — ED Triage Notes (Signed)
Cough & congestion x 3 weeks  ?Dx w/ bronchitis  ?Z-pak did not help ?Prednisone & tessalon perles  ?Continues to cough - worse since Friday  ?Can't see pulmonologist until Tuesday ?Temperature was 100.7 last night  ?Hx of  asthma  ?

## 2021-05-12 NOTE — ED Provider Notes (Signed)
?[facility] ? ? ? ?CSN: [id number] ?Arrival date & time: 05/12/21  1228 ? ? ?  ? ?History   ?Chief Complaint ?Chief Complaint  ?Patient presents with  ? Cough  ? ? ?HPI ?[name] is a 76 y.o. female.  ? ?Patient was treated for sinobronchitis three weeks ago.  She was started on prednisone and Tessalon perles.  After not improving in several days she then started a Z-pak.  Her sinus congestion has generally resolved, but not the cough.  Her cough became worse about five days ago, but she denies pleuritic pain or significant shortness of breath.  Last night she developed a fever to 100.7.  Patient has asthma andcontinues to take daily Symbicort. ?She has seasonal rhinitis for which she takes Allegra, and has a past history of pneumonia. ? ?The history is provided by the patient.  ? ?Past Medical History:  ?Diagnosis Date  ? Allergic rhinitis, cause unspecified   ? Anxiety   ? Benign paroxysmal positional vertigo   ? Cancer [facility])   ? basal cell carcinoma per right side of nostril  ? CHF (congestive heart failure) ([place]) 11/09/2016  ? had 2+ edema in legs with anxiety and SOB  ? Complication of anesthesia   ? has 4  degenerative discs in neck.  ? Depressive disorder, not elsewhere classified   ? Esophageal reflux   ? History of hiatal hernia   ? Hypertension   ? Localized osteoarthrosis not specified whether primary or secondary, lower leg   ? Other and unspecified hyperlipidemia   ? Other diseases of vocal cords   ? Peptic ulcer, unspecified site, unspecified as acute or chronic, without mention of hemorrhage, perforation, or obstruction   ? Pneumonia   ? bilat pneumonia 1987  ? Pre-diabetes   ? no meds,just diet controll  ? TMJ (temporomandibular joint disorder)   ? Unspecified arthropathy, hand   ? Unspecified asthma(493.90)   ? triggered with a virus   ? Unspecified hypothyroidism   ? ? ?Patient Active Problem List  ? Diagnosis Date Noted  ? Numbness and tingling sensation of skin 12/29/2019  ?  Spondylosis without myelopathy or radiculopathy, cervical region 12/29/2019  ? Occipital pain 12/28/2019  ? Bullous pemphigoid 11/06/2019  ? Right renal stone 09/21/2019  ? Drug-induced constipation 05/25/2019  ? Blood in stool 05/25/2019  ? Internal hemorrhoids 05/25/2019  ? Family history of colon cancer 05/25/2019  ? Lumbar degenerative disc disease 11/07/2018  ? Cervical pain 11/07/2018  ? Overweight (BMI 25.0-29.9) 08/27/2018  ? S/P left TKA 08/26/2018  ? Pain in right knee 01/03/2018  ? Aortic atherosclerosis ([place]) 09/26/2017  ? Fatty liver 11/09/2016  ? Vitamin D deficiency 06/19/2016  ? Chronic fatigue 06/19/2016  ? Alopecia 06/19/2016  ? Tendon nodule 01/16/2016  ? Venous stasis ulcer of left lower extremity ([place]) 03/03/2015  ? S/P right THA, AA 08/10/2014  ? Primary osteoarthritis of right hip 05/27/2014  ? Essential hypertension, benign 05/26/2014  ? IFG (impaired fasting glucose) 01/25/2014  ? Vitreous detachment 09/23/2012  ? Oral herpes 06/23/2012  ? OCD (obsessive compulsive disorder) 05/09/2012  ? Atypical ductal hyperplasia of breast 10/29/2011  ? Abnormal mammogram with microcalcification 10/04/2011  ? Obesity (BMI 30-39.9) 05/03/2011  ? CARRIER/SUSPECTED CARRIER GROUP B STREPTOCOCCUS 11/17/2010  ? Abnormal liver enzymes 10/25/2010  ? SOMNOLENCE 11/25/2009  ? Rheumatoid arthritis with rheumatoid factor ([place]) 11/30/2008  ? POSTMENOPAUSAL STATUS 11/30/2008  ? LOC OSTEOARTHROS NOT SPEC PRIM/SEC LOWER LEG 03/19/2007  ? Allergic rhinitis  11/05/2006  ? VOCAL CORD DISORDER 11/05/2006  ? Asthma with allergic rhinitis 11/05/2006  ? Hypothyroidism 09/24/2006  ? DEPRESSION 09/24/2006  ? VERTIGO, BENIGN PAROXYSMAL POSITION 09/24/2006  ? GERD 09/24/2006  ? HYPERLIPIDEMIA 09/23/2006  ? ? ?Past Surgical History:  ?Procedure Laterality Date  ? BREAST BIOPSY  09/07/2011  ? High Risk Lesion  ? BREAST EXCISIONAL BIOPSY Left   ? BREAST SURGERY    ? LUMPECTOMY / LEFT 10/12/2011  ? KNEE ARTHROSCOPY    ? bilat   ? NASAL  SINUS SURGERY    ? ROTATOR CUFF REPAIR    ? right   ? thumb surgery     ? left hand 45 years ago   ? TOTAL HIP ARTHROPLASTY Right 08/10/2014  ? Procedure: RIGHT TOTAL HIP ARTHROPLASTY ANTERIOR APPROACH;  Surgeon: Paralee Cancel, MD;  Location: [facility];  Service: Orthopedics;  Laterality: Right;  ? TOTAL KNEE ARTHROPLASTY Left 08/26/2018  ? Procedure: TOTAL KNEE ARTHROPLASTY;  Surgeon: Paralee Cancel, MD;  Location: [facility];  Service: Orthopedics;  Laterality: Left;  70 mins  ? ? ?OB History   ?No obstetric history on file. ?  ? ? ? ?Home Medications   ? ?Prior to Admission medications   ?Medication Sig Start Date End Date Taking? Authorizing Provider  ?doxycycline (VIBRAMYCIN) 100 MG capsule Take one cap PO Q12hr with food. 05/12/21  Yes Kandra Nicolas, MD  ?albuterol (VENTOLIN HFA) 108 (90 Base) MCG/ACT inhaler INHALE 2 PUFFS INTO THE LUNGS EVERY 6 (SIX) HOURS AS NEEDED FOR SHORTNESS OF BREATH. 11/30/20   Rigoberto Noel, MD  ?Ascorbic Acid (VITAMIN C WITH ROSE HIPS) 1000 MG tablet Take 1,000 mg by mouth daily.    [provider]  ?benzonatate (TESSALON) 200 MG capsule Take one cap PO HS PRN cough 05/12/21   Kandra Nicolas, MD  ?buPROPion (WELLBUTRIN XL) 300 MG 24 hr tablet  08/25/20   [provider]  ?Calcium Carb-Cholecalciferol (CALCIUM 600+D3 PO) Take 1 tablet by mouth 2 (two) times a day.    [provider]  ?Carboxymethylcell-Hypromellose (GENTEAL OP) Place 1-2 drops into both eyes 3 (three) times daily as needed (for dry eyes). ?Patient not taking: Reported on 05/12/2021    [provider]  ?Cholecalciferol (VITAMIN D3) 50 MCG (2000 UT) capsule Take 200 Units by mouth daily.     [provider]  ?clonazePAM (KLONOPIN) 0.5 MG tablet Take 0.25 mg by mouth 2 (two) times daily as needed for anxiety. ?Patient not taking: Reported on 05/12/2021    [provider]  ?famotidine (PEPCID) 20 MG tablet Take 20 mg by mouth at bedtime.     [provider]   ?fexofenadine (ALLEGRA) 180 MG tablet Take 180 mg by mouth at bedtime.    [provider]  ?folic acid (FOLVITE) 1 MG tablet Take 1 mg by mouth 2 (two) times daily.    [provider]  ?hydrochlorothiazide (HYDRODIURIL) 25 MG tablet TAKE 1 TABLET BY MOUTH EVERY DAY 03/20/21   Hali Marry, MD  ?levothyroxine (SYNTHROID, LEVOTHROID) 75 MCG tablet Take 75 mcg by mouth See admin instructions. Take 75 mcg by mouth daily on all days except Sunday    [provider]  ?levothyroxine (SYNTHROID, LEVOTHROID) 88 MCG tablet Take 88 mcg by mouth every Sunday.     [provider]  ?Lido-Menthol-Methyl Sal-Camph (CBD KINGS EX) Apply topically.    [provider]  ?losartan (COZAAR) 50 MG tablet TAKE 1 TABLET BY MOUTH EVERY DAY 10/12/20  Hali Marry, MD  ?Magnesium 100 MG TABS Take 150 mg by mouth 2 (two) times a day.    [provider]  ?meloxicam (MOBIC) 15 MG tablet TAKE 1 TABLET BY MOUTH EVERY DAY IN THE MORNING 05/08/21   Hali Marry, MD  ?Menthol, Topical Analgesic, (BIOFREEZE EX) Apply topically.    [provider]  ?metroNIDAZOLE (METROGEL) 1 % gel Apply topically daily. ?Patient taking differently: Apply 1 application. topically as needed. 09/22/18   Hali Marry, MD  ?pantoprazole (PROTONIX) 40 MG tablet TAKE 1 TABLET BY MOUTH EVERY DAY 11/22/20   Hali Marry, MD  ?PAXIL CR 12.5 MG 24 hr tablet Take 12.5 mg by mouth daily. 03/02/21   [provider]  ?SYMBICORT 80-4.5 MCG/ACT inhaler TAKE 2 PUFFS BY MOUTH TWICE A DAY 07/21/20   Rigoberto Noel, MD  ? ? ?Family History ?Family History  ?Problem Relation Age of Onset  ? Heart disease Father   ? Colon cancer Mother   ? Liver cancer Brother   ? Asthma Brother   ? ? ?Social History ?Social History  ? ?Tobacco Use  ? Smoking status: Former  ?  Packs/day: 0.30  ?  Years: 2.00  ?  Pack years: 0.60  ?  Types: Cigarettes  ?  Quit date: 02/12/1958  ?  Years since quitting:  63.2  ? Smokeless tobacco: Never  ?Vaping Use  ? Vaping Use: Never used  ?Substance Use Topics  ? Alcohol use: Yes  ?  Alcohol/week: 1.0 standard drink  ?  Types: 1 Glasses of wine per week  ?  Comment: wine socia

## 2021-05-16 ENCOUNTER — Ambulatory Visit: Payer: Medicare Other | Admitting: Adult Health

## 2021-05-16 DIAGNOSIS — F410 Panic disorder [episodic paroxysmal anxiety] without agoraphobia: Secondary | ICD-10-CM | POA: Diagnosis not present

## 2021-05-16 DIAGNOSIS — F422 Mixed obsessional thoughts and acts: Secondary | ICD-10-CM | POA: Diagnosis not present

## 2021-05-16 DIAGNOSIS — F902 Attention-deficit hyperactivity disorder, combined type: Secondary | ICD-10-CM | POA: Diagnosis not present

## 2021-05-30 ENCOUNTER — Encounter: Payer: Self-pay | Admitting: Adult Health

## 2021-05-30 ENCOUNTER — Ambulatory Visit (INDEPENDENT_AMBULATORY_CARE_PROVIDER_SITE_OTHER): Payer: Medicare Other | Admitting: Adult Health

## 2021-05-30 DIAGNOSIS — J383 Other diseases of vocal cords: Secondary | ICD-10-CM | POA: Diagnosis not present

## 2021-05-30 DIAGNOSIS — J309 Allergic rhinitis, unspecified: Secondary | ICD-10-CM | POA: Diagnosis not present

## 2021-05-30 DIAGNOSIS — J4531 Mild persistent asthma with (acute) exacerbation: Secondary | ICD-10-CM | POA: Diagnosis not present

## 2021-05-30 MED ORDER — BENZONATATE 200 MG PO CAPS: 200.0000 mg | ORAL_CAPSULE | Freq: Three times a day (TID) | ORAL | 1 refills | Status: DC | PRN

## 2021-05-30 NOTE — Patient Instructions (Addendum)
Increase Symbicort 2 puffs daily, rinse after use.  ?Albuterol inhaler As needed   ?Saline nasal rinses As needed   ?Continue on Allegra daily  ?Add Chlor tab '4mg'$  At bedtime As needed  drainage . (Chlorpheniramine)  ?Delsym 2 tsp Twice daily  As needed  Cough .  ?Increase Tessalon Three times a day  for cough As needed   ?Continue on Protonix daily  ?Please contact office for sooner follow up if symptoms do not improve or worsen or seek emergency care  ?Follow up Dr. Elsworth Soho  or Atziry Baranski NP 6 weeks and As needed   ?Please contact office for sooner follow up if symptoms do not improve or worsen or seek emergency care  ? ?

## 2021-05-30 NOTE — Progress Notes (Signed)
? ?'@Patient'$  ID: [name], female    DOB: February 17, 1945, 76 y.o.   MRN: [id number] ? ?Chief Complaint  ?Patient presents with  ? Follow-up  ? ? ?Referring provider: ?Hali Marry, * ? ?HPI: ?76 yo female former smoker followed for mild persistent asthma, allergic rhinitis , and chronic cough with vocal cord dysfunction  ?Medical history of RA /OA , previously on methotrexate .  ? ?TEST/EVENTS :  ?Spirometry 06/2013 ratio 63 FEV1 normal.                  ? ?05/30/2021 Acute OV : Asthma  ?Patient presents for an acute office visit . Complains of 6 weeks of cough, congestion, initial fever, wheezing, Initially seen by PCP, started on Zpack and Prednisone taper.  Had no help . Symptoms persisted with ongoing cough . Chest xray on May 12, 2021 was negative. Went to ER on 3/31. Given Doxycycline and Prednisone taper. Some better but cough persists and is aggravating her.  She is taking Mucinex, Delsym and Tessalon with only minimal relief.  She also remains on Symbicort taking this once daily.   ?She denies any hemoptysis, chest pain, orthopnea, discolored mucus. ?Has some postnasal drainage.  She is taking Allegra daily. ?Patient says she was checked for COVID-19 and this was negative. ? ? ?Allergies  ?Allergen Reactions  ? Astelin [Azelastine Hcl] Other (See Comments)  ?  Headache  ? Celebrex [Celecoxib] Other (See Comments)  ?  Stomach issues  ? Lisinopril Other (See Comments) and Cough  ?  ACE cough  ? Methotrexate Derivatives Other (See Comments)  ? Morphine Nausea Only  ? Moxifloxacin Other (See Comments)  ?  REACTION: confusion, dizziness, paranoia  ? ? ?Immunization History  ?Administered Date(s) Administered  ? Fluad Quad(high Dose 65+) 12/24/2018, 12/28/2019, 12/14/2020  ? Influenza Split 11/07/2011, 01/12/2013  ? Influenza Whole 02/12/2005, 12/02/2007, 11/24/2008  ? Influenza, High Dose Seasonal PF 11/19/2016, 12/10/2017  ? Influenza,inj,Quad PF,6+ Mos 01/11/2015  ? Influenza-Unspecified 11/07/2011,  01/12/2013, 11/12/2013, 01/11/2015, 12/24/2018  ? PFIZER(Purple Top)SARS-COV-2 Vaccination 03/20/2019, 04/15/2019  ? Pneumococcal Conjugate-13 04/26/2014  ? Pneumococcal Polysaccharide-23 02/12/2001, 11/27/2001, 11/28/2006, 11/07/2011  ? Td 02/12/1998, 06/13/1998  ? Tdap 09/23/2012  ? ? ?Past Medical History:  ?Diagnosis Date  ? Allergic rhinitis, cause unspecified   ? Anxiety   ? Benign paroxysmal positional vertigo   ? Cancer [facility])   ? basal cell carcinoma per right side of nostril  ? CHF (congestive heart failure) ([place]) 11/09/2016  ? had 2+ edema in legs with anxiety and SOB  ? Complication of anesthesia   ? has 4  degenerative discs in neck.  ? Depressive disorder, not elsewhere classified   ? Esophageal reflux   ? History of hiatal hernia   ? Hypertension   ? Localized osteoarthrosis not specified whether primary or secondary, lower leg   ? Other and unspecified hyperlipidemia   ? Other diseases of vocal cords   ? Peptic ulcer, unspecified site, unspecified as acute or chronic, without mention of hemorrhage, perforation, or obstruction   ? Pneumonia   ? bilat pneumonia 1987  ? Pre-diabetes   ? no meds,just diet controll  ? TMJ (temporomandibular joint disorder)   ? Unspecified arthropathy, hand   ? Unspecified asthma(493.90)   ? triggered with a virus   ? Unspecified hypothyroidism   ? ? ?Tobacco History: ?Social History  ? ?Tobacco Use  ?Smoking Status Former  ? Packs/day: 0.30  ? Years: 2.00  ? Pack years: 0.60  ?  Types: Cigarettes  ? Quit date: 02/12/1958  ? Years since quitting: 63.3  ?Smokeless Tobacco Never  ? ?Counseling given: Not Answered ? ? ?Outpatient Medications Prior to Visit  ?Medication Sig Dispense Refill  ? albuterol (VENTOLIN HFA) 108 (90 Base) MCG/ACT inhaler INHALE 2 PUFFS INTO THE LUNGS EVERY 6 (SIX) HOURS AS NEEDED FOR SHORTNESS OF BREATH. 8.5 each 5  ? Ascorbic Acid (VITAMIN C WITH ROSE HIPS) 1000 MG tablet Take 1,000 mg by mouth daily.    ? benzonatate (TESSALON) 200 MG capsule Take one  cap PO HS PRN cough 20 capsule 0  ? buPROPion (WELLBUTRIN XL) 300 MG 24 hr tablet     ? Calcium Carb-Cholecalciferol (CALCIUM 600+D3 PO) Take 1 tablet by mouth 2 (two) times a day.    ? Carboxymethylcell-Hypromellose (GENTEAL OP) Place 1-2 drops into both eyes 3 (three) times daily as needed (for dry eyes).    ? Cholecalciferol (VITAMIN D3) 50 MCG (2000 UT) capsule Take 200 Units by mouth daily.     ? clonazePAM (KLONOPIN) 0.5 MG tablet Take 0.25 mg by mouth 2 (two) times daily as needed for anxiety.    ? famotidine (PEPCID) 20 MG tablet Take 20 mg by mouth at bedtime.     ? fexofenadine (ALLEGRA) 180 MG tablet Take 180 mg by mouth at bedtime.    ? folic acid (FOLVITE) 1 MG tablet Take 1 mg by mouth 2 (two) times daily.    ? hydrochlorothiazide (HYDRODIURIL) 25 MG tablet TAKE 1 TABLET BY MOUTH EVERY DAY 90 tablet 1  ? levothyroxine (SYNTHROID, LEVOTHROID) 88 MCG tablet Take 88 mcg by mouth every Sunday.     ? Lido-Menthol-Methyl Sal-Camph (CBD KINGS EX) Apply topically.    ? losartan (COZAAR) 50 MG tablet TAKE 1 TABLET BY MOUTH EVERY DAY 90 tablet 3  ? Magnesium 100 MG TABS Take 150 mg by mouth 2 (two) times a day.    ? meloxicam (MOBIC) 15 MG tablet TAKE 1 TABLET BY MOUTH EVERY DAY IN THE MORNING 90 tablet 0  ? Menthol, Topical Analgesic, (BIOFREEZE EX) Apply topically.    ? metroNIDAZOLE (METROGEL) 1 % gel Apply topically daily. (Patient taking differently: Apply 1 application. topically as needed.) 60 g 1  ? pantoprazole (PROTONIX) 40 MG tablet TAKE 1 TABLET BY MOUTH EVERY DAY 90 tablet 3  ? PAXIL CR 12.5 MG 24 hr tablet Take 12.5 mg by mouth daily.    ? SYMBICORT 80-4.5 MCG/ACT inhaler TAKE 2 PUFFS BY MOUTH TWICE A DAY 30.6 each 3  ? doxycycline (VIBRAMYCIN) 100 MG capsule Take one cap PO Q12hr with food. (Patient not taking: Reported on 05/30/2021) 14 capsule 0  ? levothyroxine (SYNTHROID, LEVOTHROID) 75 MCG tablet Take 75 mcg by mouth See admin instructions. Take 75 mcg by mouth daily on all days except Sunday  (Patient not taking: Reported on 05/30/2021)    ? predniSONE (DELTASONE) 20 MG tablet Take one tab by mouth twice daily for 4 days, then one daily for 3 days. Take with food. (Patient not taking: Reported on 05/30/2021) 11 tablet 0  ? ?No facility-administered medications prior to visit.  ? ? ? ?Review of Systems:  ? ?Constitutional:   No  weight loss, night sweats,  Fevers, chills, fatigue, or  lassitude. ? ?HEENT:   No headaches,  Difficulty swallowing,  Tooth/dental problems, or  Sore throat,  ?              No sneezing, itching, ear ache,  ?+nasal congestion,  post nasal drip,  ? ?CV:  No chest pain,  Orthopnea, PND, swelling in lower extremities, anasarca, dizziness, palpitations, syncope.  ? ?GI  No heartburn, indigestion, abdominal pain, nausea, vomiting, diarrhea, change in bowel habits, loss of appetite, bloody stools.  ? ?Resp:   No chest wall deformity ? ?Skin: no rash or lesions. ? ?GU: no dysuria, change in color of urine, no urgency or frequency.  No flank pain, no hematuria  ? ?MS:  No joint pain or swelling.  No decreased range of motion.  No back pain. ? ? ? ?Physical Exam ? ?BP 130/66 (BP Location: Left Arm, Patient Position: Sitting, Cuff Size: Normal)   Pulse 80   Temp 98.8 ?F (37.1 ?C) (Oral)   Ht '5\' 3"'$  (1.6 m)   Wt 186 lb 9.6 oz (84.6 kg)   SpO2 98%   BMI 33.05 kg/m?  ? ?GEN: A/Ox3; pleasant , NAD, well nourished  ?  ?HEENT:  [place]/AT,  NOSE-clear drainage  THROAT-clear, no lesions, no postnasal drip or exudate noted.  ? ?NECK:  Supple w/ fair ROM; no JVD; normal carotid impulses w/o bruits; no thyromegaly or nodules palpated; no lymphadenopathy.   ? ?RESP  Clear  P & A; w/o, wheezes/ rales/ or rhonchi. no accessory muscle use, no dullness to percussion ? ?CARD:  RRR, no m/r/g, no peripheral edema, pulses intact, no cyanosis or clubbing. ? ?GI:   Soft & nt; nml bowel sounds; no organomegaly or masses detected.  ? ?Musco: Warm bil, no deformities or joint swelling noted.  ? ?Neuro: alert, no  focal deficits noted.   ? ?Skin: Warm, no lesions or rashes ? ? ? ?Lab Results: ? ?CBC ?   ?Component Value Date/Time  ? WBC 6.1 12/02/2020 0903  ? RBC 4.62 12/02/2020 0903  ? HGB 13.0 12/02/2020 0903  ? HGB 1

## 2021-05-31 NOTE — Assessment & Plan Note (Signed)
Maximize her current regimen. ? ?Plan  ?Patient Instructions  ?Increase Symbicort 2 puffs daily, rinse after use.  ?Albuterol inhaler As needed   ?Saline nasal rinses As needed   ?Continue on Allegra daily  ?Add Chlor tab '4mg'$  At bedtime As needed  drainage . (Chlorpheniramine)  ?Delsym 2 tsp Twice daily  As needed  Cough .  ?Increase Tessalon Three times a day  for cough As needed   ?Continue on Protonix daily  ?Please contact office for sooner follow up if symptoms do not improve or worsen or seek emergency care  ?Follow up Dr. Elsworth Soho  or Norma Ignasiak NP 6 weeks and As needed   ?Please contact office for sooner follow up if symptoms do not improve or worsen or seek emergency care  ? ?  ? ?

## 2021-05-31 NOTE — Assessment & Plan Note (Signed)
Encouraged on trigger prevention.  Voice rest ?

## 2021-05-31 NOTE — Assessment & Plan Note (Signed)
Slow to resolve asthmatic bronchitic exacerbation.  Chest x-ray was unrevealing ?She has received 2 courses of antibiotics and steroids.  Currently not coughing up any mucus so unable to get a sputum culture.  ?We will maximize her asthma maintenance regimen.  Trigger prevention and add in cough control regimen ? ?Plan  ?Patient Instructions  ?Increase Symbicort 2 puffs daily, rinse after use.  ?Albuterol inhaler As needed   ?Saline nasal rinses As needed   ?Continue on Allegra daily  ?Add Chlor tab '4mg'$  At bedtime As needed  drainage . (Chlorpheniramine)  ?Delsym 2 tsp Twice daily  As needed  Cough .  ?Increase Tessalon Three times a day  for cough As needed   ?Continue on Protonix daily  ?Please contact office for sooner follow up if symptoms do not improve or worsen or seek emergency care  ?Follow up Dr. Elsworth Soho  or Mickeal Daws NP 6 weeks and As needed   ?Please contact office for sooner follow up if symptoms do not improve or worsen or seek emergency care  ? ?  ? ?

## 2021-06-13 DIAGNOSIS — F902 Attention-deficit hyperactivity disorder, combined type: Secondary | ICD-10-CM | POA: Diagnosis not present

## 2021-06-13 DIAGNOSIS — F422 Mixed obsessional thoughts and acts: Secondary | ICD-10-CM | POA: Diagnosis not present

## 2021-06-13 DIAGNOSIS — F410 Panic disorder [episodic paroxysmal anxiety] without agoraphobia: Secondary | ICD-10-CM | POA: Diagnosis not present

## 2021-06-14 ENCOUNTER — Other Ambulatory Visit: Payer: Medicare Other

## 2021-06-16 DIAGNOSIS — M1711 Unilateral primary osteoarthritis, right knee: Secondary | ICD-10-CM | POA: Diagnosis not present

## 2021-06-16 DIAGNOSIS — M25552 Pain in left hip: Secondary | ICD-10-CM | POA: Diagnosis not present

## 2021-06-16 DIAGNOSIS — M1612 Unilateral primary osteoarthritis, left hip: Secondary | ICD-10-CM | POA: Diagnosis not present

## 2021-06-16 DIAGNOSIS — M25561 Pain in right knee: Secondary | ICD-10-CM | POA: Diagnosis not present

## 2021-06-21 ENCOUNTER — Ambulatory Visit (INDEPENDENT_AMBULATORY_CARE_PROVIDER_SITE_OTHER): Payer: Medicare Other

## 2021-06-21 DIAGNOSIS — Z780 Asymptomatic menopausal state: Secondary | ICD-10-CM | POA: Diagnosis not present

## 2021-06-21 DIAGNOSIS — M85832 Other specified disorders of bone density and structure, left forearm: Secondary | ICD-10-CM | POA: Diagnosis not present

## 2021-06-22 ENCOUNTER — Telehealth: Payer: Self-pay | Admitting: Cardiology

## 2021-06-22 NOTE — Telephone Encounter (Signed)
? ?  Pre-operative Risk Assessment  ?  ?Patient Name: [name]  ?DOB: 09-10-1945 ?MRN: [id number]  ? ?  ? ?Request for Surgical Clearance   ? ?Procedure:   left total hip athroplasty ? ?Date of Surgery:  Clearance 08/22/21                              ?   ?Surgeon:  Dr. Paralee Cancel ?Surgeon's Group or Practice Name:  [facility] ?Phone number:  [phone] ?Fax number:  [phone] (attn: Or[facility] Slick) ?  ?Type of Clearance Requested:   ?- Medical  ?  ?Type of Anesthesia:  Spinal ?  ?Additional requests/questions:   n/a ? ?Signed, ?Sheral Apley M   ?06/22/2021, 1:36 PM  ? ?

## 2021-06-22 NOTE — Telephone Encounter (Signed)
Preoperative team, please contact this patient and set up a phone call appointment for further cardiac evaluation.  Thank you for your help. ? ?Jossie Ng. Cory Rama NP-C ? ?  ?06/22/2021, 3:25 PM ?[facility] ?[address] ?Office [phone] Fax [phone] ? ?

## 2021-06-22 NOTE — Telephone Encounter (Signed)
Left message to call back for tele pre op appt 

## 2021-06-23 NOTE — Telephone Encounter (Signed)
Left message x 2 for pt call back for tele pre op appt. Cardiologist is Dr. Stanford Breed.  ?

## 2021-06-23 NOTE — Progress Notes (Signed)
[name], your bone density test shows a T score of -1.2.  This puts you into the mildly thin category called osteopenia.  Please see current recommendations below for osteopenia treatment.  Plan to recheck bone density in 2 to 3 years ? ? ?The current recommendation for osteopenia (mildly thin bones) treatment includes:  ? ?#1 calcium-total of 1200 mg of calcium daily.  If you eat a very calcium rich diet you may be able to obtain that without a supplement.  If not, then I recommend calcium 500 mg twice a day.  There are several products over-the-counter such as Caltrate D and Viactiv chews which are great options that contain calcium and vitamin D. ?#2 vitamin D-recommend 800 international units daily. ?#3 exercise-recommend 30 minutes of weightbearing exercise 3 days a week.  Resistance training ,such as doing bands and light weights, can be particularly helpful. ? ?

## 2021-06-26 NOTE — Telephone Encounter (Signed)
Left message x 3 to call for tele pre op appt. I will send out a letter to the pt today to call the office. I will send FYI to requesting office unable to reach pt who will need to do a telephone visit with the pre op provider. Will remove from the pre op call back pool at this time.  ?

## 2021-06-26 NOTE — Telephone Encounter (Signed)
Patient is returning CMA's call.  ?

## 2021-06-26 NOTE — Telephone Encounter (Signed)
I s/w the pt today and she is agreeable to appt for pre op clearance. Pt states she is due to also see Dr. Stanford Breed in 09/2021 for yrly f/u. Pt asked instead of a tele pre op appt could she have in office appt. I s/w Luisa Dago. RN for Dr. Stanford Breed to confirm if could have an appt time slot that was held. Per RN ok to use what ever time slot needed. Pt has been scheduled to see Dr. Stanford Breed 07/11/21 @ 1:30 pm at the [facility] location. Pt is grateful for the call and the help. See previous notes earlier today. I have cancelled the letter to go out. I will update the requesting office as well as pt has appt 07/11/21.  ?

## 2021-06-27 ENCOUNTER — Other Ambulatory Visit: Payer: Self-pay | Admitting: Family Medicine

## 2021-06-30 ENCOUNTER — Telehealth: Payer: Self-pay | Admitting: Family Medicine

## 2021-06-30 DIAGNOSIS — Z01818 Encounter for other preprocedural examination: Secondary | ICD-10-CM

## 2021-06-30 DIAGNOSIS — R799 Abnormal finding of blood chemistry, unspecified: Secondary | ICD-10-CM

## 2021-06-30 DIAGNOSIS — M1909 Primary osteoarthritis, other specified site: Secondary | ICD-10-CM

## 2021-06-30 NOTE — Telephone Encounter (Signed)
Please call patient and let her know that we received a request for preoperative risk evaluation with a general physical.  We are happy to perform this for her if she would like it looks like she is also getting clearance from cardiology.  She may just want to verify with the orthopedist office if they need 1 for the medical and 1 for the cardiac.  Or if the one from cardiology will suffice?

## 2021-07-03 ENCOUNTER — Ambulatory Visit: Payer: Medicare Other | Admitting: Adult Health

## 2021-07-03 NOTE — Telephone Encounter (Signed)
Orders Placed This Encounter  Procedures   CBC   COMPLETE METABOLIC PANEL WITH GFR   INR/PT   Hemoglobin A1c

## 2021-07-03 NOTE — Telephone Encounter (Signed)
Pt scheduled for 08/03/2021 for presurgical clearance.  Pt would like to have labs drawn the week before so that the results are available when she comes in for this appt.  Please advise if this is acceptable and what labs you would like ordered.  Charyl Bigger, CMA

## 2021-07-03 NOTE — Progress Notes (Signed)
HPI: Follow-up dyspnea and chest pain. Echocardiogram May 2015 showed normal LV function, trace aortic insufficiency and mild left atrial enlargement. Exercise treadmill August 2019 negative adequate but poor exercise tolerance at 4 minutes and 41 seconds.  Abdominal CT August 2021 showed aortic atherosclerosis but no aneurysm.  Nuclear study September 2022 showed ejection fraction 72%, apical thinning but no ischemia.  Since last seen, she denies increased dyspnea on exertion, orthopnea, PND, pedal edema, chest pain or syncope.  She is scheduled to have hip replacement.  Current Outpatient Medications  Medication Sig Dispense Refill   albuterol (VENTOLIN HFA) 108 (90 Base) MCG/ACT inhaler INHALE 2 PUFFS INTO THE LUNGS EVERY 6 (SIX) HOURS AS NEEDED FOR SHORTNESS OF BREATH. 8.5 each 5   Ascorbic Acid (VITAMIN C WITH ROSE HIPS) 1000 MG tablet Take 1,000 mg by mouth daily.     benzonatate (TESSALON) 200 MG capsule Take 1 capsule (200 mg total) by mouth 3 (three) times daily as needed for cough. 45 capsule 1   buPROPion (WELLBUTRIN XL) 300 MG 24 hr tablet      Calcium Carb-Cholecalciferol (CALCIUM 600+D3 PO) Take 1 tablet by mouth 2 (two) times a day.     Carboxymethylcell-Hypromellose (GENTEAL OP) Place 1-2 drops into both eyes 3 (three) times daily as needed (for dry eyes).     Cholecalciferol (VITAMIN D3) 50 MCG (2000 UT) capsule Take 200 Units by mouth daily.      clonazePAM (KLONOPIN) 0.5 MG tablet Take 0.25 mg by mouth 2 (two) times daily as needed for anxiety.     famotidine (PEPCID) 20 MG tablet Take 20 mg by mouth at bedtime.      fexofenadine (ALLEGRA) 180 MG tablet Take 180 mg by mouth at bedtime.     folic acid (FOLVITE) 1 MG tablet Take 1 mg by mouth 2 (two) times daily.     Ginkgo Biloba (GNP GINGKO BILOBA EXTRACT PO) Take by mouth 3 (three) times daily.     hydrochlorothiazide (HYDRODIURIL) 25 MG tablet TAKE 1 TABLET BY MOUTH EVERY DAY 90 tablet 1   Lido-Menthol-Methyl Sal-Camph  (CBD KINGS EX) Apply topically.     losartan (COZAAR) 50 MG tablet TAKE 1 TABLET BY MOUTH EVERY DAY 90 tablet 3   Magnesium 100 MG TABS Take 150 mg by mouth 2 (two) times a day.     meloxicam (MOBIC) 15 MG tablet TAKE 1 TABLET BY MOUTH EVERY DAY IN THE MORNING 90 tablet 0   Menthol, Topical Analgesic, (BIOFREEZE EX) Apply topically.     metroNIDAZOLE (METROGEL) 1 % gel Apply topically daily. (Patient taking differently: Apply 1 application. topically as needed.) 60 g 1   pantoprazole (PROTONIX) 40 MG tablet TAKE 1 TABLET BY MOUTH EVERY DAY 90 tablet 3   PAXIL CR 12.5 MG 24 hr tablet Take 12.5 mg by mouth daily.     SYMBICORT 80-4.5 MCG/ACT inhaler TAKE 2 PUFFS BY MOUTH TWICE A DAY 30.6 each 3   SYNTHROID 75 MCG tablet TAKE 1 TABLET IN AM ON EMPTY STOMACH 6 DAYS PER WEEK FOR THYROID (NAME BRAND ONLY DAW) 72 tablet 4   No current facility-administered medications for this visit.     Past Medical History:  Diagnosis Date   Allergic rhinitis, cause unspecified    Anxiety    Benign paroxysmal positional vertigo    Cancer (HCC)    basal cell carcinoma per right side of nostril   CHF (congestive heart failure) ([facility]) 11/09/2016   had 2+  edema in legs with anxiety and SOB   Complication of anesthesia    has 4  degenerative discs in neck.   Depressive disorder, not elsewhere classified    Esophageal reflux    History of hiatal hernia    Hypertension    Localized osteoarthrosis not specified whether primary or secondary, lower leg    Other and unspecified hyperlipidemia    Other diseases of vocal cords    Peptic ulcer, unspecified site, unspecified as acute or chronic, without mention of hemorrhage, perforation, or obstruction    Pneumonia    bilat pneumonia 1987   Pre-diabetes    no meds,just diet controll   TMJ (temporomandibular joint disorder)    Unspecified arthropathy, hand    Unspecified asthma(493.90)    triggered with a virus    Unspecified hypothyroidism     Past Surgical  History:  Procedure Laterality Date   BREAST BIOPSY  09/07/2011   High Risk Lesion   BREAST EXCISIONAL BIOPSY Left    BREAST SURGERY     LUMPECTOMY / LEFT 10/12/2011   KNEE ARTHROSCOPY     bilat    NASAL SINUS SURGERY     ROTATOR CUFF REPAIR     right    thumb surgery      left hand 45 years ago    TOTAL HIP ARTHROPLASTY Right 08/10/2014   Procedure: RIGHT TOTAL HIP ARTHROPLASTY ANTERIOR APPROACH;  Surgeon: Paralee Cancel, MD;  Location: [facility];  Service: Orthopedics;  Laterality: Right;   TOTAL KNEE ARTHROPLASTY Left 08/26/2018   Procedure: TOTAL KNEE ARTHROPLASTY;  Surgeon: Paralee Cancel, MD;  Location: [facility];  Service: Orthopedics;  Laterality: Left;  70 mins    Social History   Socioeconomic History   Marital status: Married    Spouse name: [name]   Number of children: 3   Years of education: 16   Highest education level: Bachelor's degree (e.g., BA, AB, BS)  Occupational History   Occupation: Retired    Comment: Pharmacist, hospital  Tobacco Use   Smoking status: Former    Packs/day: 0.30    Years: 2.00    Pack years: 0.60    Types: Cigarettes    Quit date: 02/12/1958    Years since quitting: 63.4   Smokeless tobacco: Never  Vaping Use   Vaping Use: Never used  Substance and Sexual Activity   Alcohol use: Yes    Alcohol/week: 1.0 standard drink    Types: 1 Glasses of wine per week    Comment: wine socially   Drug use: No   Sexual activity: Yes  Other Topics Concern   Not on file  Social History Narrative   Lives with her husband. She has three children. She enjoys spending time with her grandchildren and talking to her friends. When she does have some free time, she enjoys crocheting.    Social Determinants of Health   Financial Resource Strain: Low Risk    Difficulty of Paying Living Expenses: Not hard at all  Food Insecurity: No Food Insecurity   Worried About Charity fundraiser in the Last Year: Never true   [place] in the Last Year: Never true   Transportation Needs: No Transportation Needs   Lack of Transportation (Medical): No   Lack of Transportation (Non-Medical): No  Physical Activity: Inactive   Days of Exercise per Week: 0 days   Minutes of Exercise per Session: 0 min  Stress: No Stress Concern Present   Feeling of Stress :  Not at all  Social Connections: Socially Integrated   Frequency of Communication with Friends and Family: More than three times a week   Frequency of Social Gatherings with Friends and Family: More than three times a week   Attends Religious Services: More than 4 times per year   Active Member of [employer] or Organizations: Yes   Attends Archivist Meetings: Never   Marital Status: Married  Human resources officer Violence: Not At Risk   Fear of Current or Ex-Partner: No   Emotionally Abused: No   Physically Abused: No   Sexually Abused: No    Family History  Problem Relation Age of Onset   Heart disease Father    Colon cancer Mother    Liver cancer Brother    Asthma Brother     ROS: Hip pain but no fevers or chills, productive cough, hemoptysis, dysphasia, odynophagia, melena, hematochezia, dysuria, hematuria, rash, seizure activity, orthopnea, PND, pedal edema, claudication. Remaining systems are negative.  Physical Exam: Well-developed well-nourished in no acute distress.  Skin is warm and dry.  HEENT is normal.  Neck is supple.  Chest is clear to auscultation with normal expansion.  Cardiovascular exam is regular rate and rhythm.  Abdominal exam nontender or distended. No masses palpated. Extremities show no edema. neuro grossly intact  ECG-normal sinus rhythm at a rate of 74, left anterior fascicular block.  Personally reviewed  A/P  1 chest pain-no recurrences.  Previous nuclear study negative.  3 hyperlipidemia-managed by primary care.  4 preoperative evaluation prior to hip replacement-patient denies chest pain and previous nuclear study showed no ischemia.  She may  proceed without further cardiac evaluation.  Kirk Ruths, MD

## 2021-07-04 ENCOUNTER — Other Ambulatory Visit: Payer: Self-pay | Admitting: Family Medicine

## 2021-07-04 NOTE — Telephone Encounter (Signed)
Patient advised.

## 2021-07-11 ENCOUNTER — Encounter: Payer: Self-pay | Admitting: Cardiology

## 2021-07-11 ENCOUNTER — Ambulatory Visit (INDEPENDENT_AMBULATORY_CARE_PROVIDER_SITE_OTHER): Payer: Medicare Other | Admitting: Cardiology

## 2021-07-11 VITALS — BP 126/74 | HR 74 | Ht 61.0 in | Wt 184.6 lb

## 2021-07-11 DIAGNOSIS — R072 Precordial pain: Secondary | ICD-10-CM | POA: Diagnosis not present

## 2021-07-11 DIAGNOSIS — Z01810 Encounter for preprocedural cardiovascular examination: Secondary | ICD-10-CM | POA: Diagnosis not present

## 2021-07-11 DIAGNOSIS — I700 Atherosclerosis of aorta: Secondary | ICD-10-CM | POA: Diagnosis not present

## 2021-07-11 DIAGNOSIS — E7800 Pure hypercholesterolemia, unspecified: Secondary | ICD-10-CM | POA: Diagnosis not present

## 2021-07-11 NOTE — Patient Instructions (Signed)

## 2021-07-14 ENCOUNTER — Encounter: Payer: Self-pay | Admitting: Adult Health

## 2021-07-14 ENCOUNTER — Ambulatory Visit (INDEPENDENT_AMBULATORY_CARE_PROVIDER_SITE_OTHER): Payer: Medicare Other | Admitting: Adult Health

## 2021-07-14 DIAGNOSIS — J4530 Mild persistent asthma, uncomplicated: Secondary | ICD-10-CM

## 2021-07-14 NOTE — Progress Notes (Signed)
$'@Patient'm$  ID: [name], female    DOB: [birth year], 76 y.o.   MRN: [id number]  Chief Complaint  Patient presents with   Follow-up    Referring provider: Hali Marry, *  HPI: 50 female former smoker followed for mild persistent asthma, allergic rhinitis , and chronic cough , vocal cord dysfunction  Medical history of RA/OA , previously on Methotrexate.   TEST/EVENTS :  Spirometry 06/2013 ratio 63 FEV1 normal.     Chest xray 05/12/21 clear .   07/14/2021 Follow up : Asthma  Patient presents for 6-week follow-up.  Last visit was having a slow to resolve asthmatic bronchitic exacerbation.  She was recommended to increase her Symbicort up to 2 puffs daily.  Added in chlor tab along with cough control regimen with Delsym and Tessalon Perles.  Patient says that her symptoms significantly improved and she returned back to baseline.  Patient says that last week did she did notice that she was having some increased sinus congestion and cough.  She did saline nasal rinses which seem to improve her sinus congestion.  She does have some lingering cough.  But overall feels much better.  She denies any hemoptysis, chest pain, orthopnea.  She is currently doing Symbicort 2 puffs daily.  Remains on Allegra daily.  Does take Protonix for underlying reflux. She denies any hemoptysis, orthopnea, edema.  She says she remains very active..        Allergies  Allergen Reactions   Astelin [Azelastine Hcl] Other (See Comments)    Headache   Celebrex [Celecoxib] Other (See Comments)    Stomach issues   Lisinopril Other (See Comments) and Cough    ACE cough   Methotrexate Derivatives Other (See Comments)   Morphine Nausea Only   Moxifloxacin Other (See Comments)    REACTION: confusion, dizziness, paranoia    Immunization History  Administered Date(s) Administered   Fluad Quad(high Dose 65+) 12/24/2018, 12/28/2019, 12/14/2020   Influenza Split 11/07/2011, 01/12/2013   Influenza Whole  02/12/2005, 12/02/2007, 11/24/2008   Influenza, High Dose Seasonal PF 11/19/2016, 12/10/2017   Influenza,inj,Quad PF,6+ Mos 01/11/2015   Influenza-Unspecified 11/07/2011, 01/12/2013, 11/12/2013, 01/11/2015, 12/24/2018   PFIZER(Purple Top)SARS-COV-2 Vaccination 03/20/2019, 04/15/2019   Pneumococcal Conjugate-13 04/26/2014   Pneumococcal Polysaccharide-23 02/12/2001, 11/27/2001, 11/28/2006, 11/07/2011   Td 02/12/1998, 06/13/1998   Tdap 09/23/2012    Past Medical History:  Diagnosis Date   Allergic rhinitis, cause unspecified    Anxiety    Benign paroxysmal positional vertigo    Cancer (HCC)    basal cell carcinoma per right side of nostril   CHF (congestive heart failure) ([place]) 11/09/2016   had 2+ edema in legs with anxiety and SOB   Complication of anesthesia    has 4  degenerative discs in neck.   Depressive disorder, not elsewhere classified    Esophageal reflux    History of hiatal hernia    Hypertension    Localized osteoarthrosis not specified whether primary or secondary, lower leg    Other and unspecified hyperlipidemia    Other diseases of vocal cords    Peptic ulcer, unspecified site, unspecified as acute or chronic, without mention of hemorrhage, perforation, or obstruction    Pneumonia    bilat pneumonia 1987   Pre-diabetes    no meds,just diet controll   TMJ (temporomandibular joint disorder)    Unspecified arthropathy, hand    Unspecified asthma(493.90)    triggered with a virus    Unspecified hypothyroidism     Tobacco History: Social  History   Tobacco Use  Smoking Status Former   Packs/day: 0.30   Years: 2.00   Pack years: 0.60   Types: Cigarettes   Quit date: 02/12/1958   Years since quitting: 63.4  Smokeless Tobacco Never   Counseling given: Not Answered   Outpatient Medications Prior to Visit  Medication Sig Dispense Refill   albuterol (VENTOLIN HFA) 108 (90 Base) MCG/ACT inhaler INHALE 2 PUFFS INTO THE LUNGS EVERY 6 (SIX) HOURS AS NEEDED FOR  SHORTNESS OF BREATH. 8.5 each 5   Ascorbic Acid (VITAMIN C WITH ROSE HIPS) 1000 MG tablet Take 1,000 mg by mouth daily.     benzonatate (TESSALON) 200 MG capsule Take 1 capsule (200 mg total) by mouth 3 (three) times daily as needed for cough. 45 capsule 1   buPROPion (WELLBUTRIN XL) 300 MG 24 hr tablet      Calcium Carb-Cholecalciferol (CALCIUM 600+D3 PO) Take 1 tablet by mouth 2 (two) times a day.     Carboxymethylcell-Hypromellose (GENTEAL OP) Place 1-2 drops into both eyes 3 (three) times daily as needed (for dry eyes).     Cholecalciferol (VITAMIN D3) 50 MCG (2000 UT) capsule Take 200 Units by mouth daily.      clonazePAM (KLONOPIN) 0.5 MG tablet Take 0.25 mg by mouth 2 (two) times daily as needed for anxiety.     famotidine (PEPCID) 20 MG tablet Take 20 mg by mouth at bedtime.      fexofenadine (ALLEGRA) 180 MG tablet Take 180 mg by mouth at bedtime.     folic acid (FOLVITE) 1 MG tablet Take 1 mg by mouth 2 (two) times daily.     Ginkgo Biloba (GNP GINGKO BILOBA EXTRACT PO) Take by mouth 3 (three) times daily.     hydrochlorothiazide (HYDRODIURIL) 25 MG tablet TAKE 1 TABLET BY MOUTH EVERY DAY 90 tablet 1   Lido-Menthol-Methyl Sal-Camph (CBD KINGS EX) Apply topically.     losartan (COZAAR) 50 MG tablet TAKE 1 TABLET BY MOUTH EVERY DAY 90 tablet 3   Magnesium 100 MG TABS Take 150 mg by mouth 2 (two) times a day.     meloxicam (MOBIC) 15 MG tablet TAKE 1 TABLET BY MOUTH EVERY DAY IN THE MORNING 90 tablet 0   Menthol, Topical Analgesic, (BIOFREEZE EX) Apply topically.     metroNIDAZOLE (METROGEL) 1 % gel Apply topically daily. (Patient taking differently: Apply 1 application. topically as needed.) 60 g 1   pantoprazole (PROTONIX) 40 MG tablet TAKE 1 TABLET BY MOUTH EVERY DAY 90 tablet 3   PAXIL CR 12.5 MG 24 hr tablet Take 12.5 mg by mouth daily.     SYMBICORT 80-4.5 MCG/ACT inhaler TAKE 2 PUFFS BY MOUTH TWICE A DAY 30.6 each 3   SYNTHROID 75 MCG tablet TAKE 1 TABLET IN AM ON EMPTY STOMACH 6  DAYS PER WEEK FOR THYROID (NAME BRAND ONLY DAW) 72 tablet 4   No facility-administered medications prior to visit.     Review of Systems:   Constitutional:   No  weight loss, night sweats,  Fevers, chills, fatigue, or  lassitude.  HEENT:   No headaches,  Difficulty swallowing,  Tooth/dental problems, or  Sore throat,                No sneezing, itching, ear ache,  +nasal congestion, post nasal drip,   CV:  No chest pain,  Orthopnea, PND, swelling in lower extremities, anasarca, dizziness, palpitations, syncope.   GI  No heartburn, indigestion, abdominal pain, nausea, vomiting, diarrhea,  change in bowel habits, loss of appetite, bloody stools.   Resp: No shortness of breath with exertion or at rest.  No excess mucus, no productive cough,  No non-productive cough,  No coughing up of blood.  No change in color of mucus.  No wheezing.  No chest wall deformity  Skin: no rash or lesions.  GU: no dysuria, change in color of urine, no urgency or frequency.  No flank pain, no hematuria   MS:  No joint pain or swelling.  No decreased range of motion.  No back pain.    Physical Exam  BP 128/70 (BP Location: Left Arm, Patient Position: Sitting, Cuff Size: Normal)   Pulse 90   Temp 98 F (36.7 C) (Oral)   Ht '5\' 1"'$  (1.549 m)   Wt 186 lb 12.8 oz (84.7 kg)   SpO2 97%   BMI 35.30 kg/m   GEN: A/Ox3; pleasant , NAD, well nourished    HEENT:  JAARS/AT,   NOSE-clear, THROAT-clear, no lesions, no postnasal drip or exudate noted.   NECK:  Supple w/ fair ROM; no JVD; normal carotid impulses w/o bruits; no thyromegaly or nodules palpated; no lymphadenopathy.    RESP  Clear  P & A; w/o, wheezes/ rales/ or rhonchi. no accessory muscle use, no dullness to percussion  CARD:  RRR, no m/r/g, no peripheral edema, pulses intact, no cyanosis or clubbing.  GI:   Soft & nt; nml bowel sounds; no organomegaly or masses detected.   Musco: Warm bil, no deformities or joint swelling noted.   Neuro: alert,  no focal deficits noted.    Skin: Warm, no lesions or rashes    Lab Results:       Imaging:        Assessment & Plan:   Asthma with allergic rhinitis Much improved on current regimen  Advised to increase Symbicort to twice daily.  For the next week or 2 then can resume daily dosing.  Encouraged on trigger prevention.  Continue on current maintenance regimen On return visit consider spirometry  Plan  Patient Instructions  Increase Symbicort 2 puffs Twice daily  for 2 weeks then can go back to 2 puffs daily ,  rinse after use.  Albuterol inhaler As needed   Saline nasal rinses As needed   Continue on Allegra daily  Chlor tab '4mg'$  At bedtime As needed  drainage . (Chlorpheniramine)  Delsym 2 tsp Twice daily  As needed  Cough .  Tessalon Three times a day  for cough As needed   Continue on Protonix daily  Please contact office for sooner follow up if symptoms do not improve or worsen or seek emergency care  Follow up Dr. Elsworth Soho  or Ardon Franklin NP 6 months and As needed   Please contact office for sooner follow up if symptoms do not improve or worsen or seek emergency care         Rexene Edison, NP 07/14/2021

## 2021-07-14 NOTE — Assessment & Plan Note (Signed)
Much improved on current regimen  Advised to increase Symbicort to twice daily.  For the next week or 2 then can resume daily dosing.  Encouraged on trigger prevention.  Continue on current maintenance regimen On return visit consider spirometry  Plan  Patient Instructions  Increase Symbicort 2 puffs Twice daily  for 2 weeks then can go back to 2 puffs daily ,  rinse after use.  Albuterol inhaler As needed   Saline nasal rinses As needed   Continue on Allegra daily  Chlor tab '4mg'$  At bedtime As needed  drainage . (Chlorpheniramine)  Delsym 2 tsp Twice daily  As needed  Cough .  Tessalon Three times a day  for cough As needed   Continue on Protonix daily  Please contact office for sooner follow up if symptoms do not improve or worsen or seek emergency care  Follow up Dr. Elsworth Soho  or Devi Hopman NP 6 months and As needed   Please contact office for sooner follow up if symptoms do not improve or worsen or seek emergency care

## 2021-07-14 NOTE — Patient Instructions (Signed)
Increase Symbicort 2 puffs Twice daily  for 2 weeks then can go back to 2 puffs daily ,  rinse after use.  Albuterol inhaler As needed   Saline nasal rinses As needed   Continue on Allegra daily  Chlor tab '4mg'$  At bedtime As needed  drainage . (Chlorpheniramine)  Delsym 2 tsp Twice daily  As needed  Cough .  Tessalon Three times a day  for cough As needed   Continue on Protonix daily  Please contact office for sooner follow up if symptoms do not improve or worsen or seek emergency care  Follow up Dr. Elsworth Soho  or Namita Yearwood NP 6 months and As needed   Please contact office for sooner follow up if symptoms do not improve or worsen or seek emergency care

## 2021-07-18 DIAGNOSIS — F410 Panic disorder [episodic paroxysmal anxiety] without agoraphobia: Secondary | ICD-10-CM | POA: Diagnosis not present

## 2021-07-18 DIAGNOSIS — F422 Mixed obsessional thoughts and acts: Secondary | ICD-10-CM | POA: Diagnosis not present

## 2021-07-18 DIAGNOSIS — F902 Attention-deficit hyperactivity disorder, combined type: Secondary | ICD-10-CM | POA: Diagnosis not present

## 2021-07-27 DIAGNOSIS — R799 Abnormal finding of blood chemistry, unspecified: Secondary | ICD-10-CM | POA: Diagnosis not present

## 2021-07-27 DIAGNOSIS — Z01818 Encounter for other preprocedural examination: Secondary | ICD-10-CM | POA: Diagnosis not present

## 2021-07-27 DIAGNOSIS — M1909 Primary osteoarthritis, other specified site: Secondary | ICD-10-CM | POA: Diagnosis not present

## 2021-07-28 LAB — CBC
HCT: 39 % (ref 35.0–45.0)
Hemoglobin: 12.8 g/dL (ref 11.7–15.5)
MCH: 28.1 pg (ref 27.0–33.0)
MCHC: 32.8 g/dL (ref 32.0–36.0)
MCV: 85.7 fL (ref 80.0–100.0)
MPV: 10.8 fL (ref 7.5–12.5)
Platelets: 345 10*3/uL (ref 140–400)
RBC: 4.55 10*6/uL (ref 3.80–5.10)
RDW: 13.7 % (ref 11.0–15.0)
WBC: 5.6 10*3/uL (ref 3.8–10.8)

## 2021-07-28 LAB — COMPLETE METABOLIC PANEL WITH GFR
AG Ratio: 1.6 (calc) (ref 1.0–2.5)
ALT: 31 U/L — ABNORMAL HIGH (ref 6–29)
AST: 15 U/L (ref 10–35)
Albumin: 3.9 g/dL (ref 3.6–5.1)
Alkaline phosphatase (APISO): 60 U/L (ref 37–153)
BUN: 18 mg/dL (ref 7–25)
CO2: 25 mmol/L (ref 20–32)
Calcium: 9.2 mg/dL (ref 8.6–10.4)
Chloride: 104 mmol/L (ref 98–110)
Creat: 0.74 mg/dL (ref 0.60–1.00)
Globulin: 2.4 g/dL (calc) (ref 1.9–3.7)
Glucose, Bld: 104 mg/dL — ABNORMAL HIGH (ref 65–99)
Potassium: 4.3 mmol/L (ref 3.5–5.3)
Sodium: 139 mmol/L (ref 135–146)
Total Bilirubin: 0.4 mg/dL (ref 0.2–1.2)
Total Protein: 6.3 g/dL (ref 6.1–8.1)
eGFR: 83 mL/min/{1.73_m2} (ref >=60)

## 2021-07-28 LAB — PROTIME-INR
INR: 0.9
Prothrombin Time: 10.1 s (ref 9.0–11.5)

## 2021-07-28 LAB — HEMOGLOBIN A1C
Hgb A1c MFr Bld: 6.1 % of total Hgb — ABNORMAL HIGH (ref <5.7)
Mean Plasma Glucose: 128 mg/dL
eAG (mmol/L): 7.1 mmol/L

## 2021-07-28 NOTE — Progress Notes (Signed)
No overt concerns in labs.  A1C stable.  Pt has pre-surgical clearance scheduled with Dr. Madilyn Fireman on 6/22.

## 2021-08-02 DIAGNOSIS — M25512 Pain in left shoulder: Secondary | ICD-10-CM | POA: Diagnosis not present

## 2021-08-03 ENCOUNTER — Telehealth: Payer: Self-pay | Admitting: *Deleted

## 2021-08-03 ENCOUNTER — Ambulatory Visit (INDEPENDENT_AMBULATORY_CARE_PROVIDER_SITE_OTHER): Payer: Medicare Other | Admitting: Family Medicine

## 2021-08-03 ENCOUNTER — Encounter: Payer: Self-pay | Admitting: Family Medicine

## 2021-08-03 VITALS — BP 125/66 | HR 67 | Resp 16 | Ht 61.0 in | Wt 184.0 lb

## 2021-08-03 DIAGNOSIS — Z01818 Encounter for other preprocedural examination: Secondary | ICD-10-CM

## 2021-08-03 DIAGNOSIS — E038 Other specified hypothyroidism: Secondary | ICD-10-CM

## 2021-08-03 LAB — POCT URINALYSIS DIP (CLINITEK)
Bilirubin, UA: NEGATIVE
Blood, UA: NEGATIVE
Glucose, UA: NEGATIVE mg/dL
Ketones, POC UA: NEGATIVE mg/dL
Leukocytes, UA: NEGATIVE
Nitrite, UA: NEGATIVE
POC PROTEIN,UA: NEGATIVE
Spec Grav, UA: 1.015 (ref 1.010–1.025)
Urobilinogen, UA: 0.2 E.U./dL
pH, UA: 6.5 (ref 5.0–8.0)

## 2021-08-03 MED ORDER — CLOBETASOL PROPIONATE 0.05 % EX OINT: 1.0000 | TOPICAL_OINTMENT | Freq: Two times a day (BID) | CUTANEOUS | 0 refills | Status: DC | PRN

## 2021-08-03 NOTE — Addendum Note (Signed)
Addended by: Beatrice Lecher D on: 08/03/2021 02:09 PM   Modules accepted: Orders

## 2021-08-03 NOTE — Telephone Encounter (Signed)
Pre op clearance forms faxed to Attn: Orson Slick [phone]

## 2021-08-03 NOTE — Addendum Note (Signed)
Addended by: Teddy Spike on: 08/03/2021 03:22 PM   Modules accepted: Orders

## 2021-08-04 LAB — TSH: TSH: 0.58 mIU/L (ref 0.40–4.50)

## 2021-08-05 ENCOUNTER — Other Ambulatory Visit: Payer: Self-pay | Admitting: Pulmonary Disease

## 2021-08-08 NOTE — Patient Instructions (Addendum)
DUE TO COVID-19 ONLY TWO VISITORS  (aged 78 and older)  IS ALLOWED TO COME WITH YOU AND STAY IN THE WAITING ROOM ONLY DURING PRE OP AND PROCEDURE.   **NO VISITORS ARE ALLOWED IN THE SHORT STAY AREA OR RECOVERY ROOM!!**  IF YOU WILL BE ADMITTED INTO THE HOSPITAL YOU ARE ALLOWED ONLY FOUR SUPPORT PEOPLE DURING VISITATION HOURS ONLY (7 AM -8PM)   The support person(s) must pass our screening, gel in and out Visitors GUEST BADGE MUST BE WORN VISIBLY  One adult visitor may remain with you overnight and MUST be in the room by 8 P.M.   You are not required to [employer] often Do NOT share personal items Notify your provider if you are in close contact with someone who has COVID or you develop fever 100.4 or greater, new onset of sneezing, cough, sore throat, shortness of breath or body aches.        Your procedure is scheduled on:  Tuesday 08-22-21   Report to [facility] Main Entrance    Report to admitting at 5:45 AM   Call this number if you have problems the morning of surgery [phone]   Do not eat food :After Midnight the night before surgery   After Midnight you may have the following liquids until 5:15 AM DAY OF SURGERY  Clear Liquid Diet Water Black Coffee (sugar ok, NO MILK/CREAM OR CREAMERS)  Tea (sugar ok, NO MILK/CREAM OR CREAMERS) regular and decaf                             Plain Jell-O (NO RED)                                           Fruit ices (not with fruit pulp, NO RED)                                     Popsicles (NO RED)                                                                  Juice: apple, WHITE grape, WHITE cranberry Sports drinks like Gatorade (NO RED) Clear broth(vegetable,chicken,beef)                    The day of surgery:  Drink ONE (1) Pre-Surgery G2 at 5:15 AM the morning of surgery. Drink in one sitting. Do not sip.  This drink was given to you during your hospital  pre-op appointment visit. Nothing else to  drink after completing the Pre-Surgery G2.          If you have questions, please contact your surgeon's office.   FOLLOW  ANY ADDITIONAL PRE OP INSTRUCTIONS YOU RECEIVED FROM YOUR SURGEON'S OFFICE!!!     Oral Hygiene is also important to reduce your risk of infection.  Remember - BRUSH YOUR TEETH THE MORNING OF SURGERY WITH YOUR REGULAR TOOTHPASTE   Take these medicines the morning of surgery with A SIP OF WATER: Wellbutrin, Clonazepam, Pantoprazole, Paxil, Synthroid.  Okay to use inhalers and eyedrops   DO NOT [place]. PHARMACY WILL DISPENSE MEDICATIONS LISTED ON YOUR MEDICATION LIST TO YOU DURING YOUR ADMISSION [place]!  DO NOT TAKE ANY ORAL DIABETIC MEDICATIONS DAY OF YOUR SURGERY                       You may not have any metal on your body including hair pins, jewelry, and body piercing             Do not wear make-up, lotions, powders, perfumes or deodorant  Do not wear nail polish including gel and S&S, artificial/acrylic nails, or any other type of covering on natural nails including finger and toenails. If you have artificial nails, gel coating, etc. that needs to be removed by a nail salon please have this removed prior to surgery or surgery may need to be canceled/ delayed if the surgeon/ anesthesia feels like they are unable to be safely monitored.   Do not shave  48 hours prior to surgery.    Bring small overnight bag day of surgery.  Do not bring valuables to the hospital. [place].  Please read over the following fact sheets you were given: IF YOU HAVE QUESTIONS ABOUT YOUR PRE-OP INSTRUCTIONS PLEASE CALL [facility] - Preparing for Surgery Before surgery, you can play an important role.  Because skin is not sterile, your skin needs to be as free of germs as possible.  You can reduce the number of germs on your skin by washing with CHG  (chlorahexidine gluconate) soap before surgery.  CHG is an antiseptic cleaner which kills germs and bonds with the skin to continue killing germs even after washing. Please DO NOT use if you have an allergy to CHG or antibacterial soaps.  If your skin becomes reddened/irritated stop using the CHG and inform your nurse when you arrive at Short Stay. Do not shave (including legs and underarms) for at least 48 hours prior to the first CHG shower.  You may shave your face/neck.  Please follow these instructions carefully:  1.  Shower with CHG Soap the night before surgery and the  morning of surgery.  2.  If you choose to wash your hair, wash your hair first as usual with your normal  shampoo.  3.  After you shampoo, rinse your hair and body thoroughly to remove the shampoo.                             4.  Use CHG as you would any other liquid soap.  You can apply chg directly to the skin and wash.  Gently with a scrungie or clean washcloth.  5.  Apply the CHG Soap to your body ONLY FROM THE NECK DOWN.   Do   not use on face/ open                           Wound or open sores. Avoid contact with eyes, ears mouth and   genitals (private parts).  Wash face,  Genitals (private parts) with your normal soap.             6.  Wash thoroughly, paying special attention to the area where your    surgery  will be performed.  7.  Thoroughly rinse your body with warm water from the neck down.  8.  DO NOT shower/wash with your normal soap after using and rinsing off the CHG Soap.                9.  Pat yourself dry with a clean towel.            10.  Wear clean pajamas.            11.  Place clean sheets on your bed the night of your first shower and do not  sleep with pets. Day of Surgery : Do not apply any lotions/deodorants the morning of surgery.  Please wear clean clothes to the hospital/surgery center.  FAILURE TO FOLLOW THESE INSTRUCTIONS MAY RESULT IN THE CANCELLATION OF YOUR  SURGERY  PATIENT SIGNATURE_________________________________  NURSE SIGNATURE__________________________________  ________________________________________________________________________     WHAT IS A BLOOD TRANSFUSION? Blood Transfusion Information  A transfusion is the replacement of blood or some of its parts. Blood is made up of multiple cells which provide different functions. Red blood cells carry oxygen and are used for blood loss replacement. White blood cells fight against infection. Platelets control bleeding. Plasma helps clot blood. Other blood products are available for specialized needs, such as hemophilia or other clotting disorders. BEFORE THE TRANSFUSION  Who gives blood for transfusions?  Healthy volunteers who are fully evaluated to make sure their blood is safe. This is blood bank blood. Transfusion therapy is the safest it has ever been in the practice of medicine. Before blood is taken from a donor, a complete history is taken to make sure that person has no history of diseases nor engages in risky social behavior (examples are intravenous drug use or sexual activity with multiple partners). The donor's travel history is screened to minimize risk of transmitting infections, such as malaria. The donated blood is tested for signs of infectious diseases, such as HIV and hepatitis. The blood is then tested to be sure it is compatible with you in order to minimize the chance of a transfusion reaction. If you or a relative donates blood, this is often done in anticipation of surgery and is not appropriate for emergency situations. It takes many days to process the donated blood. RISKS AND COMPLICATIONS Although transfusion therapy is very safe and saves many lives, the main dangers of transfusion include:  Getting an infectious disease. Developing a transfusion reaction. This is an allergic reaction to something in the blood you were given. Every precaution is taken to prevent  this. The decision to have a blood transfusion has been considered carefully by your caregiver before blood is given. Blood is not given unless the benefits outweigh the risks. AFTER THE TRANSFUSION Right after receiving a blood transfusion, you will usually feel much better and more energetic. This is especially true if your red blood cells have gotten low (anemic). The transfusion raises the level of the red blood cells which carry oxygen, and this usually causes an energy increase. The nurse administering the transfusion will monitor you carefully for complications. HOME CARE INSTRUCTIONS  No special instructions are needed after a transfusion. You may find your energy is better. Speak with your caregiver about any limitations on activity for  underlying diseases you may have. SEEK MEDICAL CARE IF:  Your condition is not improving after your transfusion. You develop redness or irritation at the intravenous (IV) site. SEEK IMMEDIATE MEDICAL CARE IF:  Any of the following symptoms occur over the next 12 hours: Shaking chills. You have a temperature by mouth above 102 F (38.9 C), not controlled by medicine. Chest, back, or muscle pain. People around you feel you are not acting correctly or are confused. Shortness of breath or difficulty breathing. Dizziness and fainting. You get a rash or develop hives. You have a decrease in urine output. Your urine turns a dark color or changes to pink, red, or brown. Any of the following symptoms occur over the next 10 days: You have a temperature by mouth above 102 F (38.9 C), not controlled by medicine. Shortness of breath. Weakness after normal activity. The white part of the eye turns yellow (jaundice). You have a decrease in the amount of urine or are urinating less often. Your urine turns a dark color or changes to pink, red, or brown. Document Released: 01/27/2000 Document Revised: 04/23/2011 Document Reviewed: 09/15/2007 [facility] Patient  Information 2014 [place].  Incentive Spirometer    An incentive spirometer is a tool that can help keep your lungs clear and active. This tool measures how well you are filling your lungs with each breath. Taking long deep breaths may help reverse or decrease the chance of developing breathing (pulmonary) problems (especially infection) following: A long period of time when you are unable to move or be active. BEFORE THE PROCEDURE  If the spirometer includes an indicator to show your best effort, your nurse or respiratory therapist will set it to a desired goal. If possible, sit up straight or lean slightly forward. Try not to slouch. Hold the incentive spirometer in an upright position. INSTRUCTIONS FOR USE  Sit on the edge of your bed if possible, or sit up as far as you can in bed or on a chair. Hold the incentive spirometer in an upright position. Breathe out normally. Place the mouthpiece in your mouth and seal your lips tightly around it. Breathe in slowly and as deeply as possible, raising the piston or the ball toward the top of the column. Hold your breath for 3-5 seconds or for as long as possible. Allow the piston or ball to fall to the bottom of the column. Remove the mouthpiece from your mouth and breathe out normally. Rest for a few seconds and repeat Steps 1 through 7 at least 10 times every 1-2 hours when you are awake. Take your time and take a few normal breaths between deep breaths. The spirometer may include an indicator to show your best effort. Use the indicator as a goal to work toward during each repetition. After each set of 10 deep breaths, practice coughing to be sure your lungs are clear. If you have an incision (the cut made at the time of surgery), support your incision when coughing by placing a pillow or rolled up towels firmly against it. Once you are able to get out of bed, walk around indoors and cough well. You may stop using the incentive spirometer  when instructed by your caregiver.  RISKS AND COMPLICATIONS Take your time so you do not get dizzy or light-headed. If you are in pain, you may need to take or ask for pain medication before doing incentive spirometry. It is harder to take a deep breath if you are having pain. AFTER USE  Rest and breathe slowly and easily. It can be helpful to keep track of a log of your progress. Your caregiver can provide you with a simple table to help with this. If you are using the spirometer at home, follow these instructions: [place] IF:  You are having difficultly using the spirometer. You have trouble using the spirometer as often as instructed. Your pain medication is not giving enough relief while using the spirometer. You develop fever of 100.5 F (38.1 C) or higher.                                                                                                    SEEK IMMEDIATE MEDICAL CARE IF:  You cough up bloody sputum that had not been present before. You develop fever of 102 F (38.9 C) or greater. You develop worsening pain at or near the incision site. MAKE SURE YOU:  Understand these instructions. Will watch your condition. Will get help right away if you are not doing well or get worse. Document Released: 06/11/2006 Document Revised: 04/23/2011 Document Reviewed: 08/12/2006 [facility] Patient Information 2014 [place], [place].   _______________________________________________________________________

## 2021-08-09 NOTE — Progress Notes (Signed)
COVID Vaccine received:  '[]'$  No '[x]'$  Yes  Date of any COVID positive Test in last 90 days:  PCP - Beatrice Lecher, MD      Medical Clearance in Epic dated 08-03-21 and is on Chart  Cardiologist - Minus Breeding, MD        Cardiac clearance in Epic dated 07-11-2021  [place], MD  Chest x-ray - 05-12-2021  Epic EKG -  07-11-21  Epic  Stress Test - 10-18-20  Epic ECHO - 2015  Epic Cardiac Cath - no  Pacemaker/ICD device last checked: Date:       '[]'$  N/A Spinal Cord Stimulator:'[]'$  No '[]'$  Yes   Other Implants:   History of Sleep Apnea? '[]'$  No '[x]'$  Yes  '[]'$  unknown Sleep Study Date:   CPAP used?- '[]'$  No '[]'$  Yes  (Instruct to bring their mask & Tubing)  Does the patient monitor blood sugar? '[]'$  No '[]'$  Yes  '[]'$  N/A Does patient have a [employer] or Dexacom? '[]'$  No '[]'$  Yes   Fasting Blood Sugar Ranges-  Checks Blood Sugar _____ times a day  Blood Thinner Instructions: Aspirin Instructions: Last Dose:  Activity level:  Can go up a flight of stairs and perform activities of daily living without stopping and without symptoms of chest pain or shortness of breath.'[]'$  No '[]'$  Yes  '[]'$  N/A  Able to do exercises without symptoms '[]'$  No '[]'$  Yes  '[]'$  N/A  Patient unable to complete ADLs without assistance '[]'$  No '[]'$  Yes  '[]'$  N/A    Comments:   Anesthesia review: CHF, Pre-DM, Dyspnea and chest pain.  Patient denies shortness of breath, fever, cough and chest pain at PAT appointment   Patient verbalized understanding and agreement to the Pre-Surgical Instructions that were given to them at this PAT appointment. Patient was also educated of the need to review these PAT instructions again prior to his/her surgery.I reviewed the appropriate phone numbers to call if they have any and questions or concerns.

## 2021-08-10 ENCOUNTER — Encounter (HOSPITAL_COMMUNITY): Payer: Self-pay

## 2021-08-10 ENCOUNTER — Encounter (HOSPITAL_COMMUNITY)
Admission: RE | Admit: 2021-08-10 | Discharge: 2021-08-10 | Disposition: A | Discharge status: Home / Self Care | Payer: Medicare Other | Source: Ambulatory Visit | Attending: Orthopedic Surgery | Admitting: Orthopedic Surgery

## 2021-08-10 ENCOUNTER — Other Ambulatory Visit: Payer: Self-pay

## 2021-08-10 VITALS — BP 124/71 | HR 75 | Temp 99.3°F | Resp 16 | Ht 61.0 in | Wt 182.0 lb

## 2021-08-10 DIAGNOSIS — M1612 Unilateral primary osteoarthritis, left hip: Secondary | ICD-10-CM | POA: Diagnosis not present

## 2021-08-10 DIAGNOSIS — R7303 Prediabetes: Secondary | ICD-10-CM | POA: Insufficient documentation

## 2021-08-10 DIAGNOSIS — Z01812 Encounter for preprocedural laboratory examination: Secondary | ICD-10-CM | POA: Insufficient documentation

## 2021-08-10 DIAGNOSIS — Z01818 Encounter for other preprocedural examination: Secondary | ICD-10-CM

## 2021-08-10 HISTORY — DX: Unspecified asthma, uncomplicated: J45.909

## 2021-08-10 LAB — TYPE AND SCREEN
ABO/RH(D): O POS
Antibody Screen: NEGATIVE

## 2021-08-10 LAB — GLUCOSE, CAPILLARY: Glucose-Capillary: 111 mg/dL — ABNORMAL HIGH (ref 70–99)

## 2021-08-10 LAB — SURGICAL PCR SCREEN
MRSA, PCR: NEGATIVE
Staphylococcus aureus: NEGATIVE

## 2021-08-21 NOTE — Anesthesia Preprocedure Evaluation (Signed)
Anesthesia Evaluation  Patient identified by MRN, date of birth, ID band Patient awake    Reviewed: Allergy & Precautions, NPO status , Patient's Chart, lab work & pertinent test results  History of Anesthesia Complications Negative for: history of anesthetic complications  Airway Mallampati: II  TM Distance: >3 FB Neck ROM: Full    Dental no notable dental hx. (+) Dental Advisory Given, Teeth Intact   Pulmonary asthma , pneumonia, former smoker,    Pulmonary exam normal breath sounds clear to auscultation       Cardiovascular hypertension, Pt. on medications +CHF   Rhythm:Regular Rate:Normal + Systolic murmurs Echo 3005 - Left ventricle: The cavity size was normal. Wall thickness was normal. Systolic function was normal. The estimated ejection fraction was in the range of 60% to 65%. Therewas an increased relative contribution of atrial contraction to ventricular filling.  - Aortic valve: Trivial regurgitation.  - Left atrium: The atrium was mildly dilated.  - Pericardium, extracardiac: A trivial pericardial effusion  was identified.     Neuro/Psych  Headaches, PSYCHIATRIC DISORDERS Anxiety Depression    GI/Hepatic Neg liver ROS, hiatal hernia, PUD, GERD  Medicated and Controlled,  Endo/Other  Hypothyroidism   Renal/GU Renal disease     Musculoskeletal  (+) Arthritis , Rheumatoid disorders,    Abdominal (+) + obese,   Peds  Hematology negative hematology ROS (+)   Anesthesia Other Findings Day of surgery medications reviewed with the patient.  Reproductive/Obstetrics                           Anesthesia Physical  Anesthesia Plan  ASA: 3  Anesthesia Plan: Spinal   Post-op Pain Management:  Regional for Post-op pain and Tylenol PO (pre-op)*   Induction:   PONV Risk Score and Plan: 3 and Treatment may vary due to age or medical condition, Ondansetron, Propofol infusion,  Dexamethasone and Midazolam  Airway Management Planned: Natural Airway and Simple Face Mask  Additional Equipment: None  Intra-op Plan:   Post-operative Plan:   Informed Consent: I have reviewed the patients History and Physical, chart, labs and discussed the procedure including the risks, benefits and alternatives for the proposed anesthesia with the patient or authorized representative who has indicated his/her understanding and acceptance.     Dental advisory given  Plan Discussed with: CRNA  Anesthesia Plan Comments:       Anesthesia Quick Evaluation

## 2021-08-22 ENCOUNTER — Observation Stay (HOSPITAL_COMMUNITY)
Admission: RE | Admit: 2021-08-22 | Discharge: 2021-08-23 | Disposition: A | Discharge status: Home / Self Care | Payer: Medicare Other | Attending: Orthopedic Surgery | Admitting: Orthopedic Surgery

## 2021-08-22 ENCOUNTER — Encounter (HOSPITAL_COMMUNITY): Payer: Self-pay | Admitting: Orthopedic Surgery

## 2021-08-22 ENCOUNTER — Ambulatory Visit (HOSPITAL_COMMUNITY): Payer: Medicare Other

## 2021-08-22 ENCOUNTER — Encounter (HOSPITAL_COMMUNITY)
Admission: RE | Disposition: A | Discharge status: Home / Self Care | Payer: Self-pay | Source: Home / Self Care | Attending: Orthopedic Surgery

## 2021-08-22 ENCOUNTER — Ambulatory Visit (HOSPITAL_BASED_OUTPATIENT_CLINIC_OR_DEPARTMENT_OTHER): Payer: Medicare Other | Admitting: Anesthesiology

## 2021-08-22 ENCOUNTER — Other Ambulatory Visit: Payer: Self-pay

## 2021-08-22 ENCOUNTER — Observation Stay (HOSPITAL_COMMUNITY): Payer: Medicare Other

## 2021-08-22 ENCOUNTER — Ambulatory Visit (HOSPITAL_COMMUNITY): Payer: Medicare Other | Admitting: Anesthesiology

## 2021-08-22 DIAGNOSIS — E039 Hypothyroidism, unspecified: Secondary | ICD-10-CM | POA: Insufficient documentation

## 2021-08-22 DIAGNOSIS — Z96652 Presence of left artificial knee joint: Secondary | ICD-10-CM | POA: Diagnosis not present

## 2021-08-22 DIAGNOSIS — M1612 Unilateral primary osteoarthritis, left hip: Secondary | ICD-10-CM

## 2021-08-22 DIAGNOSIS — Z87891 Personal history of nicotine dependence: Secondary | ICD-10-CM

## 2021-08-22 DIAGNOSIS — I509 Heart failure, unspecified: Secondary | ICD-10-CM | POA: Insufficient documentation

## 2021-08-22 DIAGNOSIS — Z96641 Presence of right artificial hip joint: Secondary | ICD-10-CM | POA: Diagnosis not present

## 2021-08-22 DIAGNOSIS — J45909 Unspecified asthma, uncomplicated: Secondary | ICD-10-CM | POA: Diagnosis not present

## 2021-08-22 DIAGNOSIS — J189 Pneumonia, unspecified organism: Secondary | ICD-10-CM | POA: Diagnosis not present

## 2021-08-22 DIAGNOSIS — Z96642 Presence of left artificial hip joint: Secondary | ICD-10-CM

## 2021-08-22 DIAGNOSIS — Z85828 Personal history of other malignant neoplasm of skin: Secondary | ICD-10-CM | POA: Insufficient documentation

## 2021-08-22 DIAGNOSIS — I110 Hypertensive heart disease with heart failure: Secondary | ICD-10-CM | POA: Insufficient documentation

## 2021-08-22 DIAGNOSIS — R7303 Prediabetes: Secondary | ICD-10-CM

## 2021-08-22 HISTORY — PX: TOTAL HIP ARTHROPLASTY: SHX124

## 2021-08-22 SURGERY — ARTHROPLASTY, HIP, TOTAL, ANTERIOR APPROACH
Anesthesia: Spinal | Site: Hip | Laterality: Left

## 2021-08-22 MED ORDER — ONDANSETRON HCL 4 MG/2ML IJ SOLN
INTRAMUSCULAR | Status: AC
  Filled 2021-08-22: qty 2

## 2021-08-22 MED ORDER — MENTHOL 3 MG MT LOZG: 1.0000 | LOZENGE | OROMUCOSAL | Status: DC | PRN

## 2021-08-22 MED ORDER — PAROXETINE HCL ER 12.5 MG PO TB24
12.5000 mg | ORAL_TABLET | Freq: Every day | ORAL | Status: DC
Start: 2021-08-22 — End: 2021-08-23
  Administered 2021-08-22 – 2021-08-23 (×2): 12.5 mg via ORAL
  Filled 2021-08-22 (×2): qty 1

## 2021-08-22 MED ORDER — HYDROMORPHONE HCL 1 MG/ML IJ SOLN
INTRAMUSCULAR | Status: AC
  Filled 2021-08-22: qty 1

## 2021-08-22 MED ORDER — ACETAMINOPHEN 500 MG PO TABS
1000.0000 mg | ORAL_TABLET | Freq: Once | ORAL | Status: AC
Start: 2021-08-22 — End: 2021-08-22
  Administered 2021-08-22: 1000 mg via ORAL
  Filled 2021-08-22: qty 2

## 2021-08-22 MED ORDER — BISACODYL 10 MG RE SUPP: 10.0000 mg | Freq: Every day | RECTAL | Status: DC | PRN

## 2021-08-22 MED ORDER — FAMOTIDINE 20 MG PO TABS
20.0000 mg | ORAL_TABLET | Freq: Every day | ORAL | Status: DC
  Administered 2021-08-22: 20 mg via ORAL
  Filled 2021-08-22: qty 1

## 2021-08-22 MED ORDER — ONDANSETRON HCL 4 MG/2ML IJ SOLN: 4.0000 mg | Freq: Four times a day (QID) | INTRAMUSCULAR | Status: DC | PRN

## 2021-08-22 MED ORDER — LOSARTAN POTASSIUM 50 MG PO TABS
50.0000 mg | ORAL_TABLET | Freq: Every day | ORAL | Status: DC
  Administered 2021-08-23: 50 mg via ORAL
  Filled 2021-08-22: qty 1

## 2021-08-22 MED ORDER — METHOCARBAMOL 500 MG IVPB - SIMPLE MED
INTRAVENOUS | Status: AC
  Filled 2021-08-22: qty 55

## 2021-08-22 MED ORDER — BUPROPION HCL ER (XL) 300 MG PO TB24
300.0000 mg | ORAL_TABLET | Freq: Every day | ORAL | Status: DC
  Administered 2021-08-22 – 2021-08-23 (×2): 300 mg via ORAL
  Filled 2021-08-22 (×2): qty 1

## 2021-08-22 MED ORDER — LACTATED RINGERS IV SOLN: INTRAVENOUS | Status: DC

## 2021-08-22 MED ORDER — ONDANSETRON HCL 4 MG/2ML IJ SOLN
INTRAMUSCULAR | Status: DC | PRN
  Administered 2021-08-22: 4 mg via INTRAVENOUS

## 2021-08-22 MED ORDER — HYDROCHLOROTHIAZIDE 25 MG PO TABS
25.0000 mg | ORAL_TABLET | Freq: Every day | ORAL | Status: DC
  Administered 2021-08-23: 25 mg via ORAL
  Filled 2021-08-22: qty 1

## 2021-08-22 MED ORDER — ASPIRIN 81 MG PO CHEW
81.0000 mg | CHEWABLE_TABLET | Freq: Two times a day (BID) | ORAL | Status: DC
  Administered 2021-08-22 – 2021-08-23 (×2): 81 mg via ORAL
  Filled 2021-08-22 (×2): qty 1

## 2021-08-22 MED ORDER — PROPOFOL 10 MG/ML IV BOLUS
INTRAVENOUS | Status: AC
  Filled 2021-08-22: qty 20

## 2021-08-22 MED ORDER — CHLORHEXIDINE GLUCONATE 0.12 % MT SOLN
15.0000 mL | Freq: Once | OROMUCOSAL | Status: AC
  Administered 2021-08-22: 15 mL via OROMUCOSAL

## 2021-08-22 MED ORDER — PROPOFOL 10 MG/ML IV BOLUS
INTRAVENOUS | Status: DC | PRN
  Administered 2021-08-22: 30 mg via INTRAVENOUS

## 2021-08-22 MED ORDER — DEXAMETHASONE SODIUM PHOSPHATE 10 MG/ML IJ SOLN
INTRAMUSCULAR | Status: AC
  Filled 2021-08-22: qty 1

## 2021-08-22 MED ORDER — TRANEXAMIC ACID-NACL 1000-0.7 MG/100ML-% IV SOLN
1000.0000 mg | Freq: Once | INTRAVENOUS | Status: AC
  Administered 2021-08-22: 1000 mg via INTRAVENOUS
  Filled 2021-08-22: qty 100

## 2021-08-22 MED ORDER — MOMETASONE FURO-FORMOTEROL FUM 100-5 MCG/ACT IN AERO
2.0000 | INHALATION_SPRAY | Freq: Two times a day (BID) | RESPIRATORY_TRACT | Status: DC
  Filled 2021-08-22: qty 8.8

## 2021-08-22 MED ORDER — DEXAMETHASONE SODIUM PHOSPHATE 10 MG/ML IJ SOLN
8.0000 mg | Freq: Once | INTRAMUSCULAR | Status: AC
  Administered 2021-08-22: 8 mg via INTRAVENOUS

## 2021-08-22 MED ORDER — ONDANSETRON HCL 4 MG PO TABS: 4.0000 mg | ORAL_TABLET | Freq: Four times a day (QID) | ORAL | Status: DC | PRN

## 2021-08-22 MED ORDER — BUPIVACAINE IN DEXTROSE 0.75-8.25 % IT SOLN
INTRATHECAL | Status: DC | PRN
  Administered 2021-08-22: 1.6 mL via INTRATHECAL

## 2021-08-22 MED ORDER — POVIDONE-IODINE 10 % EX SWAB
2.0000 | Freq: Once | CUTANEOUS | Status: AC
  Administered 2021-08-22: 2 via TOPICAL

## 2021-08-22 MED ORDER — OXYCODONE HCL 5 MG PO TABS
5.0000 mg | ORAL_TABLET | ORAL | Status: DC | PRN
  Administered 2021-08-22: 10 mg via ORAL
  Administered 2021-08-22: 5 mg via ORAL
  Administered 2021-08-23 (×2): 10 mg via ORAL
  Filled 2021-08-22 (×2): qty 2
  Filled 2021-08-22: qty 1

## 2021-08-22 MED ORDER — MELOXICAM 15 MG PO TABS
15.0000 mg | ORAL_TABLET | Freq: Every day | ORAL | Status: DC
  Administered 2021-08-23: 15 mg via ORAL
  Filled 2021-08-22: qty 1

## 2021-08-22 MED ORDER — HYDROMORPHONE HCL 1 MG/ML IJ SOLN
0.2500 mg | INTRAMUSCULAR | Status: DC | PRN
  Administered 2021-08-22: 0.5 mg via INTRAVENOUS

## 2021-08-22 MED ORDER — HYDROMORPHONE HCL 1 MG/ML IJ SOLN: 0.5000 mg | INTRAMUSCULAR | Status: DC | PRN

## 2021-08-22 MED ORDER — OXYCODONE HCL 5 MG PO TABS
10.0000 mg | ORAL_TABLET | ORAL | Status: DC | PRN
  Administered 2021-08-23: 10 mg via ORAL
  Filled 2021-08-22 (×2): qty 2

## 2021-08-22 MED ORDER — DOCUSATE SODIUM 100 MG PO CAPS
100.0000 mg | ORAL_CAPSULE | Freq: Two times a day (BID) | ORAL | Status: DC
  Administered 2021-08-22 – 2021-08-23 (×2): 100 mg via ORAL
  Filled 2021-08-22 (×2): qty 1

## 2021-08-22 MED ORDER — PHENOL 1.4 % MT LIQD: 1.0000 | OROMUCOSAL | Status: DC | PRN

## 2021-08-22 MED ORDER — MIDAZOLAM HCL 5 MG/5ML IJ SOLN
INTRAMUSCULAR | Status: DC | PRN
  Administered 2021-08-22: 2 mg via INTRAVENOUS

## 2021-08-22 MED ORDER — ORAL CARE MOUTH RINSE: 15.0000 mL | Freq: Once | OROMUCOSAL | Status: AC

## 2021-08-22 MED ORDER — PROPOFOL 1000 MG/100ML IV EMUL
INTRAVENOUS | Status: AC
  Filled 2021-08-22: qty 100

## 2021-08-22 MED ORDER — PHENYLEPHRINE HCL-NACL 20-0.9 MG/250ML-% IV SOLN
INTRAVENOUS | Status: DC | PRN
  Administered 2021-08-22: 50 ug/min via INTRAVENOUS

## 2021-08-22 MED ORDER — DEXAMETHASONE SODIUM PHOSPHATE 10 MG/ML IJ SOLN
INTRAMUSCULAR | Status: AC
Start: 2021-08-22 — End: ?
  Filled 2021-08-22: qty 1

## 2021-08-22 MED ORDER — PANTOPRAZOLE SODIUM 40 MG PO TBEC
40.0000 mg | DELAYED_RELEASE_TABLET | Freq: Every day | ORAL | Status: DC
  Administered 2021-08-23: 40 mg via ORAL
  Filled 2021-08-22: qty 1

## 2021-08-22 MED ORDER — LEVOTHYROXINE SODIUM 75 MCG PO TABS
75.0000 ug | ORAL_TABLET | Freq: Every day | ORAL | Status: DC
  Administered 2021-08-23: 75 ug via ORAL
  Filled 2021-08-22: qty 1

## 2021-08-22 MED ORDER — PHENYLEPHRINE HCL (PRESSORS) 10 MG/ML IV SOLN
INTRAVENOUS | Status: AC
  Filled 2021-08-22: qty 1

## 2021-08-22 MED ORDER — DEXAMETHASONE SODIUM PHOSPHATE 10 MG/ML IJ SOLN
10.0000 mg | Freq: Once | INTRAMUSCULAR | Status: AC
  Administered 2021-08-23: 10 mg via INTRAVENOUS
  Filled 2021-08-22: qty 1

## 2021-08-22 MED ORDER — PROPOFOL 500 MG/50ML IV EMUL
INTRAVENOUS | Status: DC | PRN
  Administered 2021-08-22: 75 ug/kg/min via INTRAVENOUS

## 2021-08-22 MED ORDER — SODIUM CHLORIDE 0.9 % IR SOLN
Status: DC | PRN
  Administered 2021-08-22: 1000 mL

## 2021-08-22 MED ORDER — CEFAZOLIN SODIUM-DEXTROSE 2-4 GM/100ML-% IV SOLN
2.0000 g | INTRAVENOUS | Status: AC
  Administered 2021-08-22: 2 g via INTRAVENOUS
  Filled 2021-08-22: qty 100

## 2021-08-22 MED ORDER — MIDAZOLAM HCL 2 MG/2ML IJ SOLN
INTRAMUSCULAR | Status: AC
  Filled 2021-08-22: qty 2

## 2021-08-22 MED ORDER — PROMETHAZINE HCL 25 MG/ML IJ SOLN: 6.2500 mg | INTRAMUSCULAR | Status: DC | PRN

## 2021-08-22 MED ORDER — ALBUTEROL SULFATE (2.5 MG/3ML) 0.083% IN NEBU: 3.0000 mL | INHALATION_SOLUTION | Freq: Four times a day (QID) | RESPIRATORY_TRACT | Status: DC | PRN

## 2021-08-22 MED ORDER — METOCLOPRAMIDE HCL 5 MG PO TABS: 5.0000 mg | ORAL_TABLET | Freq: Three times a day (TID) | ORAL | Status: DC | PRN

## 2021-08-22 MED ORDER — TRANEXAMIC ACID-NACL 1000-0.7 MG/100ML-% IV SOLN
1000.0000 mg | INTRAVENOUS | Status: AC
  Administered 2021-08-22: 1000 mg via INTRAVENOUS
  Filled 2021-08-22: qty 100

## 2021-08-22 MED ORDER — POLYETHYLENE GLYCOL 3350 17 G PO PACK: 17.0000 g | PACK | Freq: Every day | ORAL | Status: DC | PRN

## 2021-08-22 MED ORDER — SODIUM CHLORIDE 0.9 % IV SOLN: INTRAVENOUS | Status: DC

## 2021-08-22 MED ORDER — METHOCARBAMOL 500 MG PO TABS
500.0000 mg | ORAL_TABLET | Freq: Four times a day (QID) | ORAL | Status: DC | PRN
  Filled 2021-08-22: qty 1

## 2021-08-22 MED ORDER — METOCLOPRAMIDE HCL 5 MG/ML IJ SOLN: 5.0000 mg | Freq: Three times a day (TID) | INTRAMUSCULAR | Status: DC | PRN

## 2021-08-22 MED ORDER — BENZONATATE 100 MG PO CAPS: 200.0000 mg | ORAL_CAPSULE | Freq: Three times a day (TID) | ORAL | Status: DC | PRN

## 2021-08-22 MED ORDER — METHOCARBAMOL 500 MG IVPB - SIMPLE MED
500.0000 mg | Freq: Four times a day (QID) | INTRAVENOUS | Status: DC | PRN
  Administered 2021-08-22: 500 mg via INTRAVENOUS

## 2021-08-22 MED ORDER — CEFAZOLIN SODIUM-DEXTROSE 2-4 GM/100ML-% IV SOLN
2.0000 g | Freq: Four times a day (QID) | INTRAVENOUS | Status: AC
  Administered 2021-08-22 (×2): 2 g via INTRAVENOUS
  Filled 2021-08-22 (×2): qty 100

## 2021-08-22 MED ORDER — LORATADINE 10 MG PO TABS
10.0000 mg | ORAL_TABLET | Freq: Every day | ORAL | Status: DC
  Filled 2021-08-22: qty 1

## 2021-08-22 MED ORDER — ACETAMINOPHEN 500 MG PO TABS
1000.0000 mg | ORAL_TABLET | Freq: Four times a day (QID) | ORAL | Status: DC
  Administered 2021-08-22 – 2021-08-23 (×4): 1000 mg via ORAL
  Filled 2021-08-22 (×5): qty 2

## 2021-08-22 SURGICAL SUPPLY — 42 items
BAG COUNTER SPONGE SURGICOUNT (BAG) ×1 IMPLANT
BAG DECANTER FOR FLEXI CONT (MISCELLANEOUS) IMPLANT
BAG ZIPLOCK 12X15 (MISCELLANEOUS) IMPLANT
BLADE SAG 18X100X1.27 (BLADE) ×2 IMPLANT
COVER PERINEAL POST (MISCELLANEOUS) ×2 IMPLANT
COVER SURGICAL LIGHT HANDLE (MISCELLANEOUS) ×2 IMPLANT
CUP ACETBLR 52 OD PINNACLE (Hips) ×1 IMPLANT
DERMABOND ADVANCED (GAUZE/BANDAGES/DRESSINGS) ×1
DERMABOND ADVANCED .7 DNX12 (GAUZE/BANDAGES/DRESSINGS) ×1 IMPLANT
DRAPE FOOT SWITCH (DRAPES) ×2 IMPLANT
DRAPE STERI IOBAN 125X83 (DRAPES) ×2 IMPLANT
DRAPE U-SHAPE 47X51 STRL (DRAPES) ×4 IMPLANT
DRESSING AQUACEL AG SP 3.5X10 (GAUZE/BANDAGES/DRESSINGS) ×1 IMPLANT
DRSG AQUACEL AG SP 3.5X10 (GAUZE/BANDAGES/DRESSINGS) ×2
DURAPREP 26ML APPLICATOR (WOUND CARE) ×2 IMPLANT
ELECT REM PT RETURN 15FT ADLT (MISCELLANEOUS) ×2 IMPLANT
ELIMINATOR HOLE APEX DEPUY (Hips) ×1 IMPLANT
GLOVE BIO SURGEON STRL SZ 6 (GLOVE) ×2 IMPLANT
GLOVE BIOGEL PI IND STRL 6.5 (GLOVE) ×1 IMPLANT
GLOVE BIOGEL PI IND STRL 7.5 (GLOVE) ×1 IMPLANT
GLOVE BIOGEL PI INDICATOR 6.5 (GLOVE) ×1
GLOVE BIOGEL PI INDICATOR 7.5 (GLOVE) ×1
GLOVE ORTHO TXT STRL SZ7.5 (GLOVE) ×4 IMPLANT
GOWN STRL REUS W/ TWL LRG LVL3 (GOWN DISPOSABLE) ×3 IMPLANT
GOWN STRL REUS W/TWL LRG LVL3 (GOWN DISPOSABLE) ×6
HEAD M SROM 36MM PLUS 1.5 (Hips) IMPLANT
HOLDER FOLEY CATH W/STRAP (MISCELLANEOUS) ×2 IMPLANT
KIT TURNOVER KIT A (KITS) ×1 IMPLANT
LINER NEUTRAL 52X36MM PLUS 4 (Liner) ×1 IMPLANT
PACK ANTERIOR HIP CUSTOM (KITS) ×2 IMPLANT
SCREW 6.5MMX30MM (Screw) ×1 IMPLANT
SROM M HEAD 36MM PLUS 1.5 (Hips) ×2 IMPLANT
STEM FEM ACTIS HIGH SZ3 (Stem) ×1 IMPLANT
SUT MNCRL AB 4-0 PS2 18 (SUTURE) ×2 IMPLANT
SUT STRATAFIX 0 PDS 27 VIOLET (SUTURE) ×2
SUT VIC AB 1 CT1 36 (SUTURE) ×6 IMPLANT
SUT VIC AB 2-0 CT1 27 (SUTURE) ×4
SUT VIC AB 2-0 CT1 TAPERPNT 27 (SUTURE) ×2 IMPLANT
SUTURE STRATFX 0 PDS 27 VIOLET (SUTURE) ×1 IMPLANT
TRAY FOLEY MTR SLVR 16FR STAT (SET/KITS/TRAYS/PACK) ×1 IMPLANT
TUBE SUCTION HIGH CAP CLEAR NV (SUCTIONS) ×2 IMPLANT
WATER STERILE IRR 1000ML POUR (IV SOLUTION) ×2 IMPLANT

## 2021-08-22 NOTE — Anesthesia Procedure Notes (Signed)
Spinal  Patient location during procedure: OR Start time: 08/22/2021 8:01 AM End time: 08/22/2021 8:03 AM Reason for block: surgical anesthesia Staffing Performed: resident/CRNA  Resident/CRNA: [place] Srihari Shellhammer C, CRNA Performed by: [place] Manus Rudd, CRNA Authorized by: Nolon Nations, MD   Preanesthetic Checklist Completed: patient identified, IV checked, site marked, risks and benefits discussed, surgical consent, monitors and equipment checked, pre-op evaluation and timeout performed Spinal Block Patient position: sitting Prep: DuraPrep and site prepped and draped Patient monitoring: heart rate, cardiac monitor, continuous pulse ox and blood pressure Approach: midline Location: L3-4 Injection technique: single-shot Needle Needle type: Pencan  Needle gauge: 24 G Needle length: 9 cm Assessment Sensory level: T4 Events: CSF return Additional Notes IV functioning, monitors applied to pt. Expiration date of kit checked and confirmed to be in date. Sterile prep and drape, hand hygiene and sterile gloved used. Pt was positioned and spine was prepped in sterile fashion. Skin was anesthetized with lidocaine. Free flow of clear CSF obtained prior to injecting local anesthetic into CSF x 1 attempt. Spinal needle aspirated freely following injection. Needle was carefully withdrawn, and pt tolerated procedure well. Loss of motor and sensory on exam post injection.

## 2021-08-22 NOTE — Care Plan (Signed)
Ortho Bundle Case Management Note  Patient Details  Name: [name] MRN: [id number] Date of Birth: May 15, 1945                  L THA on 08-22-21 DCP: Home with dtr and husband DME: No needs, has a RW and 3-in-1 PT: HEP   DME Arranged:  N/A DME Agency:     HH Arranged:    [place] Agency:     Additional Comments: Please contact me with any questions of if this plan should need to change.  Marianne Sofia, RN,CCM [facility]  [phone] 08/22/2021, 12:21 PM

## 2021-08-22 NOTE — Anesthesia Postprocedure Evaluation (Signed)
Anesthesia Post Note  Patient: [name]  Procedure(s) Performed: TOTAL HIP ARTHROPLASTY ANTERIOR APPROACH (Left: Hip)     Patient location during evaluation: PACU Anesthesia Type: Spinal Level of consciousness: oriented and awake and alert Pain management: pain level controlled Vital Signs Assessment: post-procedure vital signs reviewed and stable Respiratory status: spontaneous breathing, respiratory function stable and patient connected to nasal cannula oxygen Cardiovascular status: blood pressure returned to baseline and stable Postop Assessment: no headache, no backache and no apparent nausea or vomiting Anesthetic complications: no   No notable events documented.  Last Vitals:  Vitals:   08/22/21 1045 08/22/21 1134  BP: 134/71 124/66  Pulse: 67 72  Resp: 14 16  Temp: 36.4 C   SpO2: 98% 98%    Last Pain:  Vitals:   08/22/21 1045  TempSrc:   PainSc: [place]

## 2021-08-22 NOTE — Transfer of Care (Signed)
Immediate Anesthesia Transfer of Care Note  Patient: [name]  Procedure(s) Performed: TOTAL HIP ARTHROPLASTY ANTERIOR APPROACH (Left: Hip)  Patient Location: PACU  Anesthesia Type:Spinal  Level of Consciousness: awake, alert  and oriented  Airway & Oxygen Therapy: Patient Spontanous Breathing and Patient connected to face mask oxygen  Post-op Assessment: Report given to RN and Post -op Vital signs reviewed and stable  Post vital signs: Reviewed and stable  Last Vitals:  Vitals Value Taken Time  BP 98/51 08/22/21 0940  Temp    Pulse 56 08/22/21 0942  Resp 14 08/22/21 0942  SpO2 100 % 08/22/21 0942  Vitals shown include unvalidated device data.  Last Pain:  Vitals:   08/22/21 0627  TempSrc:   PainSc: 1       Patients Stated Pain Goal: 4 (58/25/18 9842)  Complications: No notable events documented.

## 2021-08-22 NOTE — H&P (Signed)
TOTAL HIP ADMISSION H&P  Patient is admitted for left total hip arthroplasty.  Subjective:  Chief Complaint: left hip pain  HPI: [name], 76 y.o. female, has a history of pain and functional disability in the left hip(s) due to arthritis and patient has failed non-surgical conservative treatments for greater than 12 weeks to include NSAID's and/or analgesics and activity modification.  Onset of symptoms was gradual starting 2 years ago with gradually worsening course since that time.The patient noted no past surgery on the left hip(s).  Patient currently rates pain in the left hip at 8 out of 10 with activity. Patient has worsening of pain with activity and weight bearing, pain that interfers with activities of daily living, and pain with passive range of motion. Patient has evidence of joint space narrowing by imaging studies. This condition presents safety issues increasing the risk of falls. \  There is no current active infection.  Patient Active Problem List   Diagnosis Date Noted   Numbness and tingling sensation of skin 12/29/2019   Spondylosis without myelopathy or radiculopathy, cervical region 12/29/2019   Occipital pain 12/28/2019   Bullous pemphigoid 11/06/2019   Right renal stone 09/21/2019   Drug-induced constipation 05/25/2019   Blood in stool 05/25/2019   Internal hemorrhoids 05/25/2019   Family history of colon cancer 05/25/2019   Lumbar degenerative disc disease 11/07/2018   Cervical pain 11/07/2018   Overweight (BMI 25.0-29.9) 08/27/2018   S/P left TKA 08/26/2018   Pain in right knee 01/03/2018   Aortic atherosclerosis ([place]) 09/26/2017   Fatty liver 11/09/2016   Vitamin D deficiency 06/19/2016   Chronic fatigue 06/19/2016   Alopecia 06/19/2016   Tendon nodule 01/16/2016   Venous stasis ulcer of left lower extremity ([place]) 03/03/2015   S/P right THA, AA 08/10/2014   Primary osteoarthritis of right hip 05/27/2014   Essential hypertension, benign 05/26/2014    IFG (impaired fasting glucose) 01/25/2014   Vitreous detachment 09/23/2012   Oral herpes 06/23/2012   OCD (obsessive compulsive disorder) 05/09/2012   Atypical ductal hyperplasia of breast 10/29/2011   Abnormal mammogram with microcalcification 10/04/2011   Obesity (BMI 30-39.9) 05/03/2011   CARRIER/SUSPECTED CARRIER GROUP B STREPTOCOCCUS 11/17/2010   Abnormal liver enzymes 10/25/2010   SOMNOLENCE 11/25/2009   Rheumatoid arthritis with rheumatoid factor ([place]) 11/30/2008   POSTMENOPAUSAL STATUS 11/30/2008   LOC OSTEOARTHROS NOT SPEC PRIM/SEC LOWER LEG 03/19/2007   Allergic rhinitis 11/05/2006   VOCAL CORD DISORDER 11/05/2006   Asthma with allergic rhinitis 11/05/2006   Hypothyroidism 09/24/2006   DEPRESSION 09/24/2006   VERTIGO, BENIGN PAROXYSMAL POSITION 09/24/2006   GERD 09/24/2006   HYPERLIPIDEMIA 09/23/2006   Past Medical History:  Diagnosis Date   Allergic rhinitis, cause unspecified    Anxiety    Asthma    Benign paroxysmal positional vertigo    Cancer ([place])    basal cell carcinoma per right side of nostril   CHF (congestive heart failure) ([place]) 11/09/2016   had 2+ edema in legs with anxiety and SOB   Complication of anesthesia    has 4  degenerative discs in neck.   Depressive disorder, not elsewhere classified    Esophageal reflux    History of hiatal hernia    Hypertension    Localized osteoarthrosis not specified whether primary or secondary, lower leg    Other and unspecified hyperlipidemia    Other diseases of vocal cords    Peptic ulcer, unspecified site, unspecified as acute or chronic, without mention of hemorrhage, perforation, or obstruction  Pneumonia    bilat pneumonia 1987   Pre-diabetes    no meds,just diet controll   TMJ (temporomandibular joint disorder)    Unspecified arthropathy, hand    Unspecified asthma(493.90)    triggered with a virus    Unspecified hypothyroidism     Past Surgical History:  Procedure Laterality Date   BREAST  BIOPSY  09/07/2011   High Risk Lesion   BREAST EXCISIONAL BIOPSY Left    BREAST SURGERY     LUMPECTOMY / LEFT 10/12/2011   KNEE ARTHROSCOPY     bilat    NASAL SINUS SURGERY     ROTATOR CUFF REPAIR     right    thumb surgery      left hand 45 years ago    TOTAL HIP ARTHROPLASTY Right 08/10/2014   Procedure: RIGHT TOTAL HIP ARTHROPLASTY ANTERIOR APPROACH;  Surgeon: Paralee Cancel, MD;  Location: [facility];  Service: Orthopedics;  Laterality: Right;   TOTAL KNEE ARTHROPLASTY Left 08/26/2018   Procedure: TOTAL KNEE ARTHROPLASTY;  Surgeon: Paralee Cancel, MD;  Location: [facility];  Service: Orthopedics;  Laterality: Left;  70 mins    Current Facility-Administered Medications  Medication Dose Route Frequency Provider Last Rate Last Admin   ceFAZolin (ANCEF) IVPB 2g/100 mL premix  2 g Intravenous On Call to OR Irving Copas, PA-C       dexamethasone (DECADRON) injection 8 mg  8 mg Intravenous Once Irving Copas, PA-C       lactated ringers infusion   Intravenous Continuous Irving Copas, PA-C       lactated ringers infusion   Intravenous Continuous Myrtie Soman, MD 10 mL/hr at 08/22/21 0634 New Bag at 08/22/21 0634   tranexamic acid (CYKLOKAPRON) IVPB 1,000 mg  1,000 mg Intravenous To OR Irving Copas, PA-C       Allergies  Allergen Reactions   Astelin [Azelastine Hcl] Other (See Comments)    Headache   Celebrex [Celecoxib] Other (See Comments)    Stomach issues   Lisinopril Other (See Comments) and Cough    ACE cough   Methotrexate Derivatives Other (See Comments)   Morphine Nausea Only   Moxifloxacin Other (See Comments)    REACTION: confusion, dizziness, paranoia    Social History   Tobacco Use   Smoking status: Former    Packs/day: 0.30    Years: 2.00    Total pack years: 0.60    Types: Cigarettes    Quit date: 02/12/1958    Years since quitting: 63.5   Smokeless tobacco: Never  Substance Use Topics   Alcohol use: Yes    Alcohol/week: 1.0 standard drink of alcohol     Types: 1 Glasses of wine per week    Comment: wine socially    Family History  Problem Relation Age of Onset   Heart disease Father    Colon cancer Mother    Liver cancer Brother    Asthma Brother      Review of Systems  Constitutional:  Negative for chills and fever.  Respiratory:  Negative for cough and shortness of breath.   Cardiovascular:  Negative for chest pain.  Gastrointestinal:  Negative for nausea and vomiting.  Musculoskeletal:  Positive for arthralgias.     Objective:  Physical Exam Well nourished and well developed. General: Alert and oriented x3, cooperative and pleasant, no acute distress. Head: normocephalic, atraumatic, neck supple. Eyes: EOMI.  Musculoskeletal: Left hip exam: Painful and limited hip flexion internal rotation to just 5 degrees with  external rotation of 20 degrees reproducing pain over the anterior and lateral aspect of the hip Right hip exam reveals fluid range of motion without reproducible pain from 20 degrees of internal to 30 degrees of external rotation Active hip flexion on the right side over 120 degrees with 5/5 strength   Calves soft and nontender. Motor function intact in LE. Strength 5/5 LE bilaterally. Neuro: Distal pulses 2+. Sensation to light touch intact in LE.  Vital signs in last 24 hours: Temp:  [98 F (36.7 C)] 98 F (36.7 C) (07/11 0614) Pulse Rate:  [76] 76 (07/11 0614) Resp:  [16] 16 (07/11 0614) BP: (137)/(78) 137/78 (07/11 0614) SpO2:  [96 %] 96 % (07/11 0614) Weight:  [82.6 kg] 82.6 kg (07/11 0627)  Labs:   Estimated body mass index is 34.39 kg/m as calculated from the following:   Height as of this encounter: '5\' 1"'$  (1.549 m).   Weight as of this encounter: 82.6 kg.   Imaging Review Plain radiographs demonstrate severe degenerative joint disease of the left hip(s). The bone quality appears to be adequate for age and reported activity level.      Assessment/Plan:  End stage arthritis, left  hip(s)  The patient history, physical examination, clinical judgement of the provider and imaging studies are consistent with end stage degenerative joint disease of the left hip(s) and total hip arthroplasty is deemed medically necessary. The treatment options including medical management, injection therapy, arthroscopy and arthroplasty were discussed at length. The risks and benefits of total hip arthroplasty were presented and reviewed. The risks due to aseptic loosening, infection, stiffness, dislocation/subluxation,  thromboembolic complications and other imponderables were discussed.  The patient acknowledged the explanation, agreed to proceed with the plan and consent was signed. Patient is being admitted for inpatient treatment for surgery, pain control, PT, OT, prophylactic antibiotics, VTE prophylaxis, progressive ambulation and ADL's and discharge planning.The patient is planning to be discharged  home.  Therapy Plans: HEP Disposition: Home with husband Planned DVT Prophylaxis: aspirin '81mg'$  BID DME needed: none PCP: Dr. Suzi Roots, clearance received Rheumatologist: Dr. Ouida Sills - prior (rheumatoid arthritis) TXA: IV Allergies: lisinopril - cough, morphine - N/V, moxifloxacin - confusion , astelin - Anesthesia Concerns: none BMI: 34.2 Last HgbA1c: Not diabetic   Other: - Foley - but send Abx in case of UTI - Hx of asthma - Oxycodone, robaxin, tylenol, meloxicam   Costella Hatcher, PA-C Orthopedic Surgery [facility] [place] [phone]

## 2021-08-22 NOTE — Op Note (Signed)
NAME:  [name]                ACCOUNT NO.: [id number]      MEDICAL RECORD NO.: [id number]      FACILITY:  [facility]      PHYSICIAN:  Mauri Pole  DATE OF BIRTH:  [birth year]     DATE OF PROCEDURE:  08/22/2021                                 OPERATIVE REPORT         PREOPERATIVE DIAGNOSIS: Left  hip osteoarthritis.      POSTOPERATIVE DIAGNOSIS:  Left hip osteoarthritis.      PROCEDURE:  Left total hip replacement through an anterior approach   utilizing DePuy THR system, component size 52 mm pinnacle cup, a size 36+4 neutral   Altrex liner, a size 3 Hi Actis stem with a 36+1.5 Articuleze metal head ball.      SURGEON:  Pietro Cassis. Alvan Dame, M.D.      ASSISTANT:  Costella Hatcher, PA-C     ANESTHESIA:  Spinal.      SPECIMENS:  None.      COMPLICATIONS:  None.      BLOOD LOSS:  450 cc     DRAINS:  None.      INDICATION OF THE PROCEDURE:  [name] is a 76 y.o. female who had   presented to office for evaluation of left hip pain.  Radiographs revealed   progressive degenerative changes with bone-on-bone   articulation of the  hip joint, including subchondral cystic changes and osteophytes.  The patient had painful limited range of   motion significantly affecting their overall quality of life and function.  The patient was failing to    respond to conservative measures including medications and/or injections and activity modification and at this point was ready   to proceed with more definitive measures.  Consent was obtained for   benefit of pain relief.  Specific risks of infection, DVT, component   failure, dislocation, neurovascular injury, and need for revision surgery were reviewed in the office.     PROCEDURE IN DETAIL:  The patient was brought to operative theater.   Once adequate anesthesia, preoperative antibiotics, 2 gm of Ancef, 1 gm of Tranexamic Acid, and 10 mg of Decadron were administered, the patient was positioned supine on  the [employer] table.  Once the patient was safely positioned with adequate padding of boney prominences we predraped out the hip, and used fluoroscopy to confirm orientation of the pelvis.      The left hip was then prepped and draped from proximal iliac crest to   mid thigh with a shower curtain technique.      Time-out was performed identifying the patient, planned procedure, and the appropriate extremity.     An incision was then made 2 cm lateral to the   anterior superior iliac spine extending over the orientation of the   tensor fascia lata muscle and sharp dissection was carried down to the   fascia of the muscle.      The fascia was then incised.  The muscle belly was identified and swept   laterally and retractor placed along the superior neck.  Following   cauterization of the circumflex vessels and removing some pericapsular   fat, a second cobra retractor was placed on the inferior neck.  A  T-capsulotomy was made along the line of the   superior neck to the trochanteric fossa, then extended proximally and   distally.  Tag sutures were placed and the retractors were then placed   intracapsular.  We then identified the trochanteric fossa and   orientation of my neck cut and then made a neck osteotomy with the femur on traction.  The femoral   head was removed without difficulty or complication.  Traction was let   off and retractors were placed posterior and anterior around the   acetabulum.      The labrum and foveal tissue were debrided.  I began reaming with a 45 mm   reamer and reamed up to 51 mm reamer with good bony bed preparation and a 52 mm  cup was chosen.  The final 52 mm Pinnacle cup was then impacted under fluoroscopy to confirm the depth of penetration and orientation with respect to   Abduction and forward flexion.  A screw was placed into the ilium followed by the hole eliminator.  The final   36+4 neutral Altrex liner was impacted with good visualized rim fit.   The cup was positioned anatomically within the acetabular portion of the pelvis.      At this point, the femur was rolled to 100 degrees.  Further capsule was   released off the inferior aspect of the femoral neck.  I then   released the superior capsule proximally.  With the leg in a neutral position the hook was placed laterally   along the femur under the vastus lateralis origin and elevated manually and then held in position using the hook attachment on the bed.  The leg was then extended and adducted with the leg rolled to 100   degrees of external rotation.  Retractors were placed along the medial calcar and posteriorly over the greater trochanter.  Once the proximal femur was fully   exposed, I used a box osteotome to set orientation.  I then began   broaching with the starting chili pepper broach and passed this by hand and then broached up to 3.  With the 3 broach in place I chose a high offset neck and did several trial reductions.  The offset was appropriate, leg lengths   appeared to be equal best matched with the +1.5 head ball trial confirmed radiographically.   Given these findings, I went ahead and dislocated the hip, repositioned all   retractors and positioned the right hip in the extended and abducted position.  The final 3 Hi Tri Lock stem was   chosen and it was impacted down to the level of neck cut.  Based on this   and the trial reductions, a final 36+1.5 Articuleze metal head ball was chosen and   impacted onto a clean and dry trunnion, and the hip was reduced.  The   hip had been irrigated throughout the case again at this point.  I did   reapproximate the superior capsular leaflet to the anterior leaflet   using #1 Vicryl.  The fascia of the   tensor fascia lata muscle was then reapproximated using #1 Vicryl and #0 Stratafix sutures.  The   remaining wound was closed with 2-0 Vicryl and running 4-0 Monocryl.   The hip was cleaned, dried, and dressed sterilely using  Dermabond and   Aquacel dressing.  The patient was then brought   to recovery room in stable condition tolerating the procedure well.    Costella Hatcher,  PA-C was present for the entirety of the case involved from   preoperative positioning, perioperative retractor management, general   facilitation of the case, as well as primary wound closure as assistant.            Pietro Cassis Alvan Dame, M.D.        08/22/2021 9:26 AM

## 2021-08-22 NOTE — Evaluation (Signed)
Physical Therapy Evaluation Patient Details Name: [name] MRN: [id number] DOB: 1945/04/07 Today's Date: 08/22/2021  History of Present Illness  76 yo female s/p L THA DA 08/22/21. Hx of L TKA 08/2018, R THA, OCD, RA, DM, R RTC repair  Clinical Impression  On eval POD 0, pt was Min guard A for mobility. She walked ~150 feet with a RW. Minimal pain with activity. She tolerated activity well. Plan is for d/c home on tomorrow as long as pt continues to progress well. Will issue HEP on tomorrow.        Recommendations for follow up therapy are one component of a multi-disciplinary discharge planning process, led by the attending physician.  Recommendations may be updated based on patient status, additional functional criteria and insurance authorization.  Follow Up Recommendations Follow physician's recommendations for discharge plan and follow up therapies      Assistance Recommended at Discharge PRN  Patient can return home with the following  A little help with walking and/or transfers;A little help with bathing/dressing/bathroom;Assist for transportation;Assistance with cooking/housework;Help with stairs or ramp for entrance    Equipment Recommendations None recommended by PT  Recommendations for Other Services       Functional Status Assessment Patient has had a recent decline in their functional status and demonstrates the ability to make significant improvements in function in a reasonable and predictable amount of time.     Precautions / Restrictions Precautions Precautions: None Restrictions Weight Bearing Restrictions: No Other Position/Activity Restrictions: WBAT      Mobility  Bed Mobility Overal bed mobility: Needs Assistance Bed Mobility: Supine to Sit     Supine to sit: Min guard, HOB elevated     General bed mobility comments: Min guard for safety. Increased time. Cues provided.    Transfers Overall transfer level: Needs assistance Equipment used:  Rolling walker (2 wheels) Transfers: Sit to/from Stand Sit to Stand: Min guard           General transfer comment: Min guard for safety. Cues for safety, hand placement.    Ambulation/Gait Ambulation/Gait assistance: Min guard Gait Distance (Feet): 150 Feet Assistive device: Rolling walker (2 wheels) Gait Pattern/deviations: Step-through pattern, Decreased stride length       General Gait Details: Min guard for safety. Slow steady gait. Pt denied dizziness.  Stairs            Wheelchair Mobility    Modified Rankin (Stroke Patients Only)       Balance Overall balance assessment: Mild deficits observed, not formally tested                                           Pertinent Vitals/Pain      Home Living Family/patient expects to be discharged to:: Private residence Living Arrangements: Spouse/significant other Available Help at Discharge: Family;Available 24 hours/day Type of Home: House Home Access: Stairs to enter Entrance Stairs-Rails: None Entrance Stairs-Number of Steps: 1   Home Layout: Multi-level;Able to live on main level with bedroom/bathroom Home Equipment: Rolling Walker (2 wheels);BSC/3in1;Cane - single point;Shower seat      Prior Function Prior Level of Function : Independent/Modified Independent             Mobility Comments: using cane       Hand Dominance        Extremity/Trunk Assessment   Upper Extremity Assessment Upper Extremity Assessment: Overall [facility]  for tasks assessed    Lower Extremity Assessment Lower Extremity Assessment: Generalized weakness    Cervical / Trunk Assessment Cervical / Trunk Assessment: Normal  Communication   Communication: No difficulties  Cognition Arousal/Alertness: Awake/alert Behavior During Therapy: WFL for tasks assessed/performed, Anxious Overall Cognitive Status: Within Functional Limits for tasks assessed                                 General  Comments: mild anxiety        General Comments      Exercises     Assessment/Plan    PT Assessment Patient needs continued PT services  PT Problem List Decreased strength;Decreased balance;Decreased range of motion;Decreased activity tolerance;Decreased mobility;Decreased knowledge of use of DME;Pain       PT Treatment Interventions DME instruction;Gait training;Functional mobility training;Therapeutic activities;Balance training;Patient/family education;Therapeutic exercise;Stair training    PT Goals (Current goals can be found in the Care Plan section)  Acute Rehab PT Goals Patient Stated Goal: home tomorrow PT Goal Formulation: With patient/family Time For Goal Achievement: 09/05/21 Potential to Achieve Goals: Good    Frequency 7X/week     Co-evaluation               AM-PAC PT "6 Clicks" Mobility  Outcome Measure Help needed turning from your back to your side while in a flat bed without using bedrails?: A Little Help needed moving from lying on your back to sitting on the side of a flat bed without using bedrails?: A Little Help needed moving to and from a bed to a chair (including a wheelchair)?: A Little Help needed standing up from a chair using your arms (e.g., wheelchair or bedside chair)?: A Little Help needed to walk in hospital room?: A Little Help needed climbing 3-5 steps with a railing? : A Little 6 Click Score: 18    End of Session Equipment Utilized During Treatment: Gait belt Activity Tolerance: Patient tolerated treatment well Patient left: in chair;with call bell/phone within reach;with family/visitor present   PT Visit Diagnosis: Pain;Other abnormalities of gait and mobility (R26.89) Pain - Right/Left: Left Pain - part of body: Hip    Time: 1411-1435 PT Time Calculation (min) (ACUTE ONLY): 24 min   Charges:   PT Evaluation $PT Eval Low Complexity: 1 Low PT Treatments $Gait Training: 8-22 mins      Doreatha Massed, PT Acute  Rehabilitation  Office: [phone] Pager: [phone]

## 2021-08-22 NOTE — Discharge Instructions (Signed)

## 2021-08-23 DIAGNOSIS — I110 Hypertensive heart disease with heart failure: Secondary | ICD-10-CM | POA: Diagnosis not present

## 2021-08-23 DIAGNOSIS — Z85828 Personal history of other malignant neoplasm of skin: Secondary | ICD-10-CM | POA: Diagnosis not present

## 2021-08-23 DIAGNOSIS — M1612 Unilateral primary osteoarthritis, left hip: Secondary | ICD-10-CM | POA: Diagnosis not present

## 2021-08-23 DIAGNOSIS — J45909 Unspecified asthma, uncomplicated: Secondary | ICD-10-CM | POA: Diagnosis not present

## 2021-08-23 DIAGNOSIS — I509 Heart failure, unspecified: Secondary | ICD-10-CM | POA: Diagnosis not present

## 2021-08-23 DIAGNOSIS — E039 Hypothyroidism, unspecified: Secondary | ICD-10-CM | POA: Diagnosis not present

## 2021-08-23 LAB — CBC
HCT: 32.2 % — ABNORMAL LOW (ref 36.0–46.0)
Hemoglobin: 10.3 g/dL — ABNORMAL LOW (ref 12.0–15.0)
MCH: 28.7 pg (ref 26.0–34.0)
MCHC: 32 g/dL (ref 30.0–36.0)
MCV: 89.7 fL (ref 80.0–100.0)
Platelets: 267 10*3/uL (ref 150–400)
RBC: 3.59 MIL/uL — ABNORMAL LOW (ref 3.87–5.11)
RDW: 13.7 % (ref 11.5–15.5)
WBC: 11.4 10*3/uL — ABNORMAL HIGH (ref 4.0–10.5)
nRBC: 0 % (ref 0.0–0.2)

## 2021-08-23 LAB — BASIC METABOLIC PANEL
Anion gap: 6 (ref 5–15)
BUN: 14 mg/dL (ref 8–23)
CO2: 26 mmol/L (ref 22–32)
Calcium: 8.3 mg/dL — ABNORMAL LOW (ref 8.9–10.3)
Chloride: 107 mmol/L (ref 98–111)
Creatinine, Ser: 0.65 mg/dL (ref 0.44–1.00)
GFR, Estimated: >60 mL/min (ref >60)
Glucose, Bld: 130 mg/dL — ABNORMAL HIGH (ref 70–99)
Potassium: 3.8 mmol/L (ref 3.5–5.1)
Sodium: 139 mmol/L (ref 135–145)

## 2021-08-23 MED ORDER — METHOCARBAMOL 500 MG PO TABS: 500.0000 mg | ORAL_TABLET | Freq: Four times a day (QID) | ORAL | 0 refills | Status: DC | PRN

## 2021-08-23 MED ORDER — ACETAMINOPHEN 500 MG PO TABS: 1000.0000 mg | ORAL_TABLET | Freq: Four times a day (QID) | ORAL | 0 refills | Status: DC

## 2021-08-23 MED ORDER — ASPIRIN 81 MG PO CHEW: 81.0000 mg | CHEWABLE_TABLET | Freq: Two times a day (BID) | ORAL | 0 refills | Status: AC

## 2021-08-23 MED ORDER — NITROFURANTOIN MONOHYD MACRO 100 MG PO CAPS: 100.0000 mg | ORAL_CAPSULE | Freq: Two times a day (BID) | ORAL | 0 refills | Status: AC

## 2021-08-23 MED ORDER — POLYETHYLENE GLYCOL 3350 17 G PO PACK: 17.0000 g | PACK | Freq: Every day | ORAL | 0 refills | Status: AC | PRN

## 2021-08-23 MED ORDER — OXYCODONE HCL 5 MG PO TABS
5.0000 mg | ORAL_TABLET | ORAL | 0 refills | Status: DC | PRN
Start: 2021-08-23 — End: 2021-09-19

## 2021-08-23 NOTE — Progress Notes (Signed)
Subjective: 1 Day Post-Op Procedure(s) (LRB): TOTAL HIP ARTHROPLASTY ANTERIOR APPROACH (Left) Patient reports pain as mild.   Patient seen in rounds for Dr. Alvan Dame. Patient is resting in bed on exam this morning. She is in good spirits, and tells me she has had a good experience with all of her staff here. No acute events overnight, but she tells me she did have increased pain overnight. Ambulated 150 feet with PT yesterday.  We will start therapy today.   Objective: Vital signs in last 24 hours: Temp:  [96.2 F (35.7 C)-98.5 F (36.9 C)] 98.2 F (36.8 C) (07/12 0623) Pulse Rate:  [56-86] 81 (07/12 0623) Resp:  [14-18] 18 (07/12 0623) BP: (98-140)/(51-73) 132/66 (07/12 0623) SpO2:  [96 %-100 %] 98 % (07/12 0741)  Intake/Output from previous day:  Intake/Output Summary (Last 24 hours) at 08/23/2021 0815 Last data filed at 08/23/2021 0600 Gross per 24 hour  Intake 4221.41 ml  Output 4250 ml  Net -28.59 ml     Intake/Output this shift: No intake/output data recorded.  Labs: Recent Labs    08/23/21 0354  HGB 10.3*   Recent Labs    08/23/21 0354  WBC 11.4*  RBC 3.59*  HCT 32.2*  PLT 267   Recent Labs    08/23/21 0354  NA 139  K 3.8  CL 107  CO2 26  BUN 14  CREATININE 0.65  GLUCOSE 130*  CALCIUM 8.3*   No results for input(s): "LABPT", "INR" in the last 72 hours.  Exam: General - Patient is Alert and Oriented Extremity - Neurologically intact Sensation intact distally Intact pulses distally Dorsiflexion/Plantar flexion intact Dressing - dressing C/D/I Motor Function - intact, moving foot and toes well on exam.   Past Medical History:  Diagnosis Date   Allergic rhinitis, cause unspecified    Anxiety    Asthma    Benign paroxysmal positional vertigo    Cancer (HCC)    basal cell carcinoma per right side of nostril   CHF (congestive heart failure) ([place]) 11/09/2016   had 2+ edema in legs with anxiety and SOB   Complication of anesthesia    has 4   degenerative discs in neck.   Depressive disorder, not elsewhere classified    Esophageal reflux    History of hiatal hernia    Hypertension    Localized osteoarthrosis not specified whether primary or secondary, lower leg    Other and unspecified hyperlipidemia    Other diseases of vocal cords    Peptic ulcer, unspecified site, unspecified as acute or chronic, without mention of hemorrhage, perforation, or obstruction    Pneumonia    bilat pneumonia 1987   Pre-diabetes    no meds,just diet controll   TMJ (temporomandibular joint disorder)    Unspecified arthropathy, hand    Unspecified asthma(493.90)    triggered with a virus    Unspecified hypothyroidism     Assessment/Plan: 1 Day Post-Op Procedure(s) (LRB): TOTAL HIP ARTHROPLASTY ANTERIOR APPROACH (Left) Principal Problem:   S/P total left hip arthroplasty  Estimated body mass index is 34.39 kg/m as calculated from the following:   Height as of this encounter: '5\' 1"'$  (1.549 m).   Weight as of this encounter: 82.6 kg. Advance diet Up with therapy D/C IV fluids  DVT Prophylaxis - Aspirin Weight bearing as tolerated.  Hgb stable at 10.3 this AM.  Plan is to go Home after hospital stay. Plan for discharge today after meeting goals with therapy. Follow up in the office  in 2 weeks.   Griffith Citron, PA-C Orthopedic Surgery [phone] 08/23/2021, 8:15 AM

## 2021-08-23 NOTE — TOC Transition Note (Signed)
Transition of Care [facility]) - CM/SW Discharge Note   Patient Details  Name: [name] MRN: [id number] Date of Birth: 1945/07/31  [facility] of [facility]) CM/SW Contact:  Lennart Pall, LCSW Phone Number: 08/23/2021, 11:08 AM   Clinical Narrative:     Met briefly with pt and confirming plans for HEP and has all needed DME at home.  No TOC needs.  Final next level of care: Home/Self Care Barriers to Discharge: No Barriers Identified   Patient Goals and CMS Choice Patient states their goals for this hospitalization and ongoing recovery are:: return home      Discharge Placement                       Discharge Plan and Services                DME Arranged: N/A                    Social Determinants of Health (SDOH) Interventions     Readmission Risk Interventions     No data to display

## 2021-08-23 NOTE — Progress Notes (Signed)
Physical Therapy Treatment Patient Details Name: [name] MRN: [id number] DOB: 13-Dec-1945 Today's Date: 08/23/2021   History of Present Illness 76 yo female s/p L THA DA 08/22/21. Hx of L TKA 08/2018, R THA, OCD, RA, DM, R RTC repair    PT Comments    Progressing with mobility. All education completed. Encouraged pt to try to ambulate every hour and to follow HEP 2x/day as tolerated.    Recommendations for follow up therapy are one component of a multi-disciplinary discharge planning process, led by the attending physician.  Recommendations may be updated based on patient status, additional functional criteria and insurance authorization.  Follow Up Recommendations  Follow physician's recommendations for discharge plan and follow up therapies     Assistance Recommended at Discharge PRN  Patient can return home with the following A little help with walking and/or transfers;A little help with bathing/dressing/bathroom;Assist for transportation;Assistance with cooking/housework;Help with stairs or ramp for entrance   Equipment Recommendations  None recommended by PT    Recommendations for Other Services       Precautions / Restrictions Precautions Precautions: None Restrictions Weight Bearing Restrictions: No LLE Weight Bearing: Weight bearing as tolerated     Mobility  Bed Mobility               General bed mobility comments: oob in recliner    Transfers Overall transfer level: Needs assistance Equipment used: Rolling walker (2 wheels) Transfers: Sit to/from Stand Sit to Stand: Min guard           General transfer comment: Min guard for safety. Cues for safety, hand placement. Increased time.    Ambulation/Gait Ambulation/Gait assistance: Min guard Gait Distance (Feet): 200 Feet Assistive device: Rolling walker (2 wheels) Gait Pattern/deviations: Step-through pattern, Decreased stride length       General Gait Details: Min guard for safety. Slow  steady gait. Pt denied dizziness.   Stairs             Wheelchair Mobility    Modified Rankin (Stroke Patients Only)       Balance Overall balance assessment: Mild deficits observed, not formally tested                                          Cognition Arousal/Alertness: Awake/alert Behavior During Therapy: WFL for tasks assessed/performed Overall Cognitive Status: Within Functional Limits for tasks assessed                                          Exercises Total Joint Exercises Ankle Circles/Pumps: AROM, Both, 10 reps Quad Sets: AROM, Both, 10 reps Heel Slides: AAROM, Left, 10 reps (pt used gait belt) Hip ABduction/ADduction: AAROM, Left, 10 reps (pt used gait belt) Knee Flexion: AROM, Left, 10 reps, Standing Marching in Standing: AROM, Both, 10 reps, Standing    General Comments        Pertinent Vitals/Pain Pain Assessment Pain Assessment: 0-10 Pain Score: 6  Pain Location: L thigh Pain Descriptors / Indicators: Discomfort, Sore Pain Intervention(s): Limited activity within patient's tolerance, Monitored during session    Home Living                          Prior Function  PT Goals (current goals can now be found in the care plan section) Progress towards PT goals: Progressing toward goals    Frequency    7X/week      PT Plan Current plan remains appropriate    Co-evaluation              AM-PAC PT "6 Clicks" Mobility   Outcome Measure  Help needed turning from your back to your side while in a flat bed without using bedrails?: A Little Help needed moving from lying on your back to sitting on the side of a flat bed without using bedrails?: A Little Help needed moving to and from a bed to a chair (including a wheelchair)?: A Little Help needed standing up from a chair using your arms (e.g., wheelchair or bedside chair)?: A Little Help needed to walk in hospital room?: A  Little Help needed climbing 3-5 steps with a railing? : A Little 6 Click Score: 18    End of Session Equipment Utilized During Treatment: Gait belt Activity Tolerance: Patient tolerated treatment well Patient left: in chair   PT Visit Diagnosis: Pain;Other abnormalities of gait and mobility (R26.89) Pain - Right/Left: Left Pain - part of body: Hip     Time: 1350-1415 PT Time Calculation (min) (ACUTE ONLY): 25 min  Charges:  $Gait Training: 23-37 mins $Therapeutic Exercise: 8-22 mins                        Doreatha Massed, PT Acute Rehabilitation  Office: [phone] Pager: [phone]

## 2021-08-23 NOTE — Plan of Care (Signed)
  Problem: Education: Goal: Knowledge of General Education information will improve Description: Including pain rating scale, medication(s)/side effects and non-pharmacologic comfort measures Outcome: Progressing   Problem: Activity: Goal: Risk for activity intolerance will decrease Outcome: Progressing   Problem: Pain Managment: Goal: General experience of comfort will improve Outcome: Progressing   

## 2021-08-23 NOTE — Progress Notes (Signed)
Physical Therapy Treatment Patient Details Name: [name] MRN: [id number] DOB: 10/26/1945 Today's Date: 08/23/2021   History of Present Illness 76 yo female s/p L THA DA 08/22/21. Hx of L TKA 08/2018, R THA, OCD, RA, DM, R RTC repair    PT Comments    Progressing with mobility. Pt reports increased pain on today compared to yesterday. Reviewed and practiced exercises from HEP. Will plan to have a 2nd session prior to d/c home.    Recommendations for follow up therapy are one component of a multi-disciplinary discharge planning process, led by the attending physician.  Recommendations may be updated based on patient status, additional functional criteria and insurance authorization.  Follow Up Recommendations  Follow physician's recommendations for discharge plan and follow up therapies     Assistance Recommended at Discharge PRN  Patient can return home with the following A little help with walking and/or transfers;A little help with bathing/dressing/bathroom;Assist for transportation;Assistance with cooking/housework;Help with stairs or ramp for entrance   Equipment Recommendations  None recommended by PT    Recommendations for Other Services       Precautions / Restrictions Precautions Precautions: None Restrictions Weight Bearing Restrictions: No LLE Weight Bearing: Weight bearing as tolerated     Mobility  Bed Mobility               General bed mobility comments: oob in recliner    Transfers Overall transfer level: Needs assistance Equipment used: Rolling walker (2 wheels) Transfers: Sit to/from Stand Sit to Stand: Min guard           General transfer comment: Min guard for safety. Cues for safety, hand placement. Increased time.    Ambulation/Gait Ambulation/Gait assistance: Min guard Gait Distance (Feet): 200 Feet Assistive device: Rolling walker (2 wheels) Gait Pattern/deviations: Step-through pattern, Decreased stride length        General Gait Details: Min guard for safety. Slow steady gait. Pt denied dizziness.   Stairs             Wheelchair Mobility    Modified Rankin (Stroke Patients Only)       Balance Overall balance assessment: Mild deficits observed, not formally tested                                          Cognition Arousal/Alertness: Awake/alert Behavior During Therapy: WFL for tasks assessed/performed Overall Cognitive Status: Within Functional Limits for tasks assessed                                          Exercises Total Joint Exercises Ankle Circles/Pumps: AROM, Both, 10 reps Quad Sets: AROM, Both, 10 reps Heel Slides: AAROM, Left, 10 reps (pt used gait belt) Hip ABduction/ADduction: AAROM, Left, 10 reps (pt used gait belt) Knee Flexion: AROM, Left, 10 reps, Standing Marching in Standing: AROM, Both, 10 reps, Standing    General Comments        Pertinent Vitals/Pain Pain Assessment Pain Assessment: 0-10 Pain Score: 6  Pain Location: L thigh Pain Descriptors / Indicators: Discomfort, Sore Pain Intervention(s): Limited activity within patient's tolerance, Monitored during session, Repositioned, Ice applied    Home Living  Prior Function            PT Goals (current goals can now be found in the care plan section) Progress towards PT goals: Progressing toward goals    Frequency    7X/week      PT Plan Current plan remains appropriate    Co-evaluation              AM-PAC PT "6 Clicks" Mobility   Outcome Measure  Help needed turning from your back to your side while in a flat bed without using bedrails?: A Little Help needed moving from lying on your back to sitting on the side of a flat bed without using bedrails?: A Little Help needed moving to and from a bed to a chair (including a wheelchair)?: A Little Help needed standing up from a chair using your arms (e.g.,  wheelchair or bedside chair)?: A Little Help needed to walk in hospital room?: A Little Help needed climbing 3-5 steps with a railing? : A Little 6 Click Score: 18    End of Session Equipment Utilized During Treatment: Gait belt Activity Tolerance: Patient tolerated treatment well Patient left: in chair;with call bell/phone within reach   PT Visit Diagnosis: Pain;Other abnormalities of gait and mobility (R26.89) Pain - Right/Left: Left Pain - part of body: Hip     Time: 4734-0370 PT Time Calculation (min) (ACUTE ONLY): 27 min  Charges:  $Gait Training: 8-22 mins $Therapeutic Exercise: 8-22 mins                        Doreatha Massed, PT Acute Rehabilitation  Office: [phone] Pager: [phone]

## 2021-08-24 ENCOUNTER — Encounter (HOSPITAL_COMMUNITY): Payer: Self-pay | Admitting: Orthopedic Surgery

## 2021-08-31 NOTE — Discharge Summary (Signed)
Patient ID: Alsha Meland MRN: [id number] DOB/AGE: 78-Sep-1947 78 y.o.  Admit date: 08/22/2021 Discharge date: 08/23/2021  Admission Diagnoses:  Left hip osteoarthritis  Discharge Diagnoses:  Principal Problem:   S/P total left hip arthroplasty   Past Medical History:  Diagnosis Date   Allergic rhinitis, cause unspecified    Anxiety    Asthma    Benign paroxysmal positional vertigo    Cancer (HCC)    basal cell carcinoma per right side of nostril   CHF (congestive heart failure) ([place]) 11/09/2016   had 2+ edema in legs with anxiety and SOB   Complication of anesthesia    has 4  degenerative discs in neck.   Depressive disorder, not elsewhere classified    Esophageal reflux    History of hiatal hernia    Hypertension    Localized osteoarthrosis not specified whether primary or secondary, lower leg    Other and unspecified hyperlipidemia    Other diseases of vocal cords    Peptic ulcer, unspecified site, unspecified as acute or chronic, without mention of hemorrhage, perforation, or obstruction    Pneumonia    bilat pneumonia 1987   Pre-diabetes    no meds,just diet controll   TMJ (temporomandibular joint disorder)    Unspecified arthropathy, hand    Unspecified asthma(493.90)    triggered with a virus    Unspecified hypothyroidism     Surgeries: Procedure(s): TOTAL HIP ARTHROPLASTY ANTERIOR APPROACH on 08/22/2021   Consultants:   Discharged Condition: Improved  Hospital Course: Kierra Jezewski is an 76 y.o. female who was admitted 08/22/2021 for operative treatment ofS/P total left hip arthroplasty. Patient has severe unremitting pain that affects sleep, daily activities, and work/hobbies. After pre-op clearance the patient was taken to the operating room on 08/22/2021 and underwent  Procedure(s): TOTAL HIP ARTHROPLASTY ANTERIOR APPROACH.    Patient was given perioperative antibiotics:  Anti-infectives (From admission, onward)    Start     Dose/Rate Route  Frequency Ordered Stop   08/23/21 0000  nitrofurantoin, macrocrystal-monohydrate, (MACROBID) 100 MG capsule        100 mg Oral 2 times daily 08/23/21 0819 08/28/21 2359   08/22/21 1400  ceFAZolin (ANCEF) IVPB 2g/100 mL premix        2 g 200 mL/hr over 30 Minutes Intravenous Every 6 hours 08/22/21 1122 08/22/21 2152   08/22/21 0615  ceFAZolin (ANCEF) IVPB 2g/100 mL premix        2 g 200 mL/hr over 30 Minutes Intravenous On call to O.R. 08/22/21 8546 08/22/21 0810        Patient was given sequential compression devices, early ambulation, and chemoprophylaxis to prevent DVT. Patient worked with PT and was meeting their goals regarding safe ambulation and transfers.  Patient benefited maximally from hospital stay and there were no complications.    Recent vital signs: No data found.   Recent laboratory studies: No results for input(s): "WBC", "HGB", "HCT", "PLT", "NA", "K", "CL", "CO2", "BUN", "CREATININE", "GLUCOSE", "INR", "CALCIUM" in the last 72 hours.  Invalid input(s): "PT", "2"   Discharge Medications:   Allergies as of 08/23/2021       Reactions   Astelin [azelastine Hcl] Other (See Comments)   Headache   Celebrex [celecoxib] Other (See Comments)   Stomach issues   Lisinopril Other (See Comments), Cough   ACE cough   Methotrexate Derivatives Other (See Comments)   Morphine Nausea Only   Moxifloxacin Other (See Comments)   REACTION: confusion, dizziness, paranoia  Medication List     TAKE these medications    acetaminophen 500 MG tablet Commonly known as: TYLENOL Take 2 tablets (1,000 mg total) by mouth every 6 (six) hours.   albuterol 108 (90 Base) MCG/ACT inhaler Commonly known as: VENTOLIN HFA INHALE 2 PUFFS INTO THE LUNGS EVERY 6 (SIX) HOURS AS NEEDED FOR SHORTNESS OF BREATH.   aspirin 81 MG chewable tablet Chew 1 tablet (81 mg total) by mouth 2 (two) times daily for 28 days.   benzonatate 200 MG capsule Commonly known as: TESSALON Take 1  capsule (200 mg total) by mouth 3 (three) times daily as needed for cough.   BIOFREEZE EX Apply 1 Application topically daily as needed (pain).   buPROPion 300 MG 24 hr tablet Commonly known as: WELLBUTRIN XL Take 300 mg by mouth daily.   CALCIUM 600+D3 PO Take 1 tablet by mouth daily.   CBD KINGS EX Apply 1 Application topically in the morning and at bedtime.   clobetasol ointment 0.05 % Commonly known as: TEMOVATE Apply 1 Application topically 2 (two) times daily as needed.   clonazePAM 0.5 MG tablet Commonly known as: KLONOPIN Take 0.125 mg by mouth daily as needed for anxiety.   famotidine 20 MG tablet Commonly known as: PEPCID Take 20 mg by mouth at bedtime.   fexofenadine 180 MG tablet Commonly known as: ALLEGRA Take 180 mg by mouth at bedtime.   folic acid 1 MG tablet Commonly known as: FOLVITE Take 1 mg by mouth 2 (two) times daily.   GENTEAL OP Place 1-2 drops into both eyes 3 (three) times daily as needed (for dry eyes).   GNP GINGKO BILOBA EXTRACT PO Take 1 tablet by mouth 3 (three) times daily with meals.   hydrochlorothiazide 25 MG tablet Commonly known as: HYDRODIURIL TAKE 1 TABLET BY MOUTH EVERY DAY   losartan 50 MG tablet Commonly known as: COZAAR TAKE 1 TABLET BY MOUTH EVERY DAY   Magnesium 100 MG Tabs Take 100 mg by mouth 2 (two) times a day.   meloxicam 15 MG tablet Commonly known as: MOBIC TAKE 1 TABLET BY MOUTH EVERY DAY IN THE MORNING   methocarbamol 500 MG tablet Commonly known as: ROBAXIN Take 1 tablet (500 mg total) by mouth every 6 (six) hours as needed for muscle spasms.   metroNIDAZOLE 1 % gel Commonly known as: Metrogel Apply topically daily. What changed:  how much to take when to take this reasons to take this   Mucinex Maximum Strength 1200 MG Tb12 Generic drug: Guaifenesin Take 1,200 mg by mouth in the morning and at bedtime.   oxyCODONE 5 MG immediate release tablet Commonly known as: Oxy IR/ROXICODONE Take 1  tablet (5 mg total) by mouth every 4 (four) hours as needed for severe pain.   pantoprazole 40 MG tablet Commonly known as: PROTONIX TAKE 1 TABLET BY MOUTH EVERY DAY What changed: when to take this   Paxil CR 12.5 MG 24 hr tablet Generic drug: PARoxetine Take 12.5 mg by mouth daily.   polyethylene glycol 17 g packet Commonly known as: MIRALAX / GLYCOLAX Take 17 g by mouth daily as needed for mild constipation.   Symbicort 80-4.5 MCG/ACT inhaler Generic drug: budesonide-formoterol TAKE 2 PUFFS BY MOUTH TWICE A DAY   Synthroid 75 MCG tablet Generic drug: levothyroxine TAKE 1 TABLET IN AM ON EMPTY STOMACH 6 DAYS PER WEEK FOR THYROID (NAME BRAND ONLY DAW) What changed: See the new instructions.   SYSTANE ULTRA PF OP Place 1 drop  into both eyes daily as needed (dry eyes).   vitamin C with rose hips 1000 MG tablet Take 1,000 mg by mouth daily.   Vitamin D3 50 MCG (2000 UT) capsule Take 2,000 Units by mouth 3 (three) times a week.       ASK your doctor about these medications    nitrofurantoin (macrocrystal-monohydrate) 100 MG capsule Commonly known as: Macrobid Take 1 capsule (100 mg total) by mouth 2 (two) times daily for 5 days. Take if needed for UTI following foley catheterization Ask about: Should I take this medication?               Discharge Care Instructions  (From admission, onward)           Start     Ordered   08/23/21 0000  Change dressing       Comments: Maintain surgical dressing until follow up in the clinic. If the edges start to pull up, may reinforce with tape. If the dressing is no longer working, may remove and cover with gauze and tape, but must keep the area dry and clean.  Call with any questions or concerns.   08/23/21 0819            Diagnostic Studies: DG Pelvis Portable  Result Date: 08/22/2021 CLINICAL DATA:  Status post left total hip replacement EXAM: PORTABLE PELVIS 1 VIEWS COMPARISON:  Radiograph dated May 03, 2011;  same day fluoroscopic images dated August 23, 2011 FINDINGS: Interval postsurgical changes from left total hip arthroplasty. Arthroplasty components appear in their expected alignment. No periprosthetic fracture is identified. Expected postoperative changes within the overlying soft tissues. Prior right total hip replacement. IMPRESSION: Postsurgical changes from left total hip replacement. Electronically Signed   By: Yetta Glassman M.D.   On: 08/22/2021 10:48   DG HIP UNILAT WITH PELVIS 1V LEFT  Result Date: 08/22/2021 CLINICAL DATA:  [id number] C-arm fluoroscopic imaging was provided for the left hip replacement EXAM: DG HIP (WITH OR WITHOUT PELVIS) 1V*L* COMPARISON:  None Available. FINDINGS: Eight intraoperative fluoroscopic spot images of the left hip are available for interpretation. Native left hip x-rays demonstrate moderate degenerative changes with some narrowing of the hip joint space. Post left hip replacement images demonstrate total left hip prosthesis and the prosthetic components are in near anatomical alignment. Left-sided soft tissue emphysema. There are right total hip replacement changes seen as well in the partially visualized right hip. IMPRESSION: Status post left total hip replacement and the prosthetic components are in near anatomical alignment. Soft tissue emphysema. Electronically Signed   By: Frazier Richards M.D.   On: 08/22/2021 10:30   DG C-Arm 1-60 Min-No Report  Result Date: 08/22/2021 Fluoroscopy was utilized by the requesting physician.  No radiographic interpretation.   DG C-Arm 1-60 Min-No Report  Result Date: 08/22/2021 Fluoroscopy was utilized by the requesting physician.  No radiographic interpretation.    Disposition: Discharge disposition: 01-Home or Self Care       Discharge Instructions     Call MD / Call 911   Complete by: As directed    If you experience chest pain or shortness of breath, CALL 911 and be transported to the hospital emergency room.  If  you develope a fever above 101 F, pus (white drainage) or increased drainage or redness at the wound, or calf pain, call your surgeon's office.   Change dressing   Complete by: As directed    Maintain surgical dressing until follow up in the clinic. If the  edges start to pull up, may reinforce with tape. If the dressing is no longer working, may remove and cover with gauze and tape, but must keep the area dry and clean.  Call with any questions or concerns.   Constipation Prevention   Complete by: As directed    Drink plenty of fluids.  Prune juice may be helpful.  You may use a stool softener, such as Colace (over the counter) 100 mg twice a day.  Use MiraLax (over the counter) for constipation as needed.   Diet - low sodium heart healthy   Complete by: As directed    Increase activity slowly as tolerated   Complete by: As directed    Weight bearing as tolerated with assist device (walker, cane, etc) as directed, use it as long as suggested by your surgeon or therapist, typically at least 4-6 weeks.   Post-operative opioid taper instructions:   Complete by: As directed    POST-OPERATIVE OPIOID TAPER INSTRUCTIONS: It is important to wean off of your opioid medication as soon as possible. If you do not need pain medication after your surgery it is ok to stop day one. Opioids include: Codeine, Hydrocodone(Norco, Vicodin), Oxycodone(Percocet, oxycontin) and hydromorphone amongst others.  Long term and even short term use of opiods can cause: Increased pain response Dependence Constipation Depression Respiratory depression And more.  Withdrawal symptoms can include Flu like symptoms Nausea, vomiting And more Techniques to manage these symptoms Hydrate well Eat regular healthy meals Stay active Use relaxation techniques(deep breathing, meditating, yoga) Do Not substitute Alcohol to help with tapering If you have been on opioids for less than two weeks and do not have pain than it is ok  to stop all together.  Plan to wean off of opioids This plan should start within one week post op of your joint replacement. Maintain the same interval or time between taking each dose and first decrease the dose.  Cut the total daily intake of opioids by one tablet each day Next start to increase the time between doses. The last dose that should be eliminated is the evening dose.      TED hose   Complete by: As directed    Use stockings (TED hose) for 2 weeks on both leg(s).  You may remove them at night for sleeping.        Follow-up Information     Paralee Cancel, MD. Go on 09/06/2021.   Specialty: Orthopedic Surgery Why: You are scheduled for first post op appointment on Wednesday July 26th at 2:15pm. Contact information: [address] [place] [address] [phone]                  Signed: Irving Copas 08/31/2021, 6:55 AM

## 2021-09-12 DIAGNOSIS — F902 Attention-deficit hyperactivity disorder, combined type: Secondary | ICD-10-CM | POA: Diagnosis not present

## 2021-09-12 DIAGNOSIS — F422 Mixed obsessional thoughts and acts: Secondary | ICD-10-CM | POA: Diagnosis not present

## 2021-09-12 DIAGNOSIS — F410 Panic disorder [episodic paroxysmal anxiety] without agoraphobia: Secondary | ICD-10-CM | POA: Diagnosis not present

## 2021-09-18 ENCOUNTER — Telehealth: Payer: Self-pay

## 2021-09-18 DIAGNOSIS — D649 Anemia, unspecified: Secondary | ICD-10-CM

## 2021-09-18 NOTE — Telephone Encounter (Signed)
Orders Placed This Encounter  Procedures   COMPLETE METABOLIC PANEL WITH GFR   CBC

## 2021-09-19 ENCOUNTER — Telehealth (INDEPENDENT_AMBULATORY_CARE_PROVIDER_SITE_OTHER): Payer: Medicare Other | Admitting: Family Medicine

## 2021-09-19 VITALS — Ht 61.0 in | Wt 182.0 lb

## 2021-09-19 DIAGNOSIS — R748 Abnormal levels of other serum enzymes: Secondary | ICD-10-CM | POA: Diagnosis not present

## 2021-09-19 DIAGNOSIS — D649 Anemia, unspecified: Secondary | ICD-10-CM

## 2021-09-19 NOTE — Progress Notes (Signed)
Virtual Visit via Video Note  I connected with Jenene Slicker on 09/19/21 at  1:00 PM EDT by a video enabled telemedicine application and verified that I am speaking with the correct person using two identifiers.   I discussed the limitations of evaluation and management by telemedicine and the availability of in person appointments. The patient expressed understanding and agreed to proceed.  Patient location: at home Provider location: in office  Subjective:    CC:   Chief Complaint  Patient presents with   Follow-up    HPI: Hemoglobin was low at 10.3 the night  after the hip replacement surgery. Started the iron and colace the weekend after. She has been taking miralax nightly as well to help with the constipation from the iron.  Has been walking well since her surgery.   Questions about bleeding risk with meloxicam. She does take gingko but doesn't twant to come off.  She takes 3 of those tabs daily.  Had some questions about her CBC.  In particular regarding the low WBC and low RBC.  She also had a question about her liver enzymes.  They have been elevated over the last year but trending downwards and she wants to know if any of her medications could be causing an issue.  She is doing weight watchers on line. She has 7 lbs. is quite excited about the changes that she is made.  Past medical history, Surgical history, Family history not pertinant except as noted below, Social history, Allergies, and medications have been entered into the medical record, reviewed, and corrections made.    Objective:    General: Speaking clearly in complete sentences without any shortness of breath.  Alert and oriented x3.  Normal judgment. No apparent acute distress.    Impression and Recommendations:    Problem List Items Addressed This Visit   None Visit Diagnoses     Anemia following surgery    -  Primary   Relevant Orders   CBC   COMPLETE METABOLIC PANEL WITH GFR    Fe+TIBC+Fer   Elevated liver enzymes       Relevant Orders   CBC   COMPLETE METABOLIC PANEL WITH GFR   Fe+TIBC+Fer      Anemia after surgery-we did discuss that it should rebound after surgery it has been almost 4 weeks.  Plans to get labs done this afternoon we will check iron level as well to make sure that she is not deficient.  But again it should normalize.  Elevated liver enzymes plan to recheck again.  It does seem to be trending down I do not see any medications that should be causing a significant issue she rarely takes Tylenol.  We discussed the most important thing being healthy diet and regular exercise she feels like she does a good job with her diet in regards to eating a lot of vegetables and lean proteins.  Discussed tapering off the meloxieam has been on it daily for years but now that she has had both hips replaced I think it would be prudent to try to taper off the medication and use it infrequently.  We discussed that it can be very hard on the kidneys to take this daily for long periods of time.  Orders Placed This Encounter  Procedures   CBC   COMPLETE METABOLIC PANEL WITH GFR   Fe+TIBC+Fer    No orders of the defined types were placed in this encounter.    I discussed the assessment  and treatment plan with the patient. The patient was provided an opportunity to ask questions and all were answered. The patient agreed with the plan and demonstrated an understanding of the instructions.   The patient was advised to call back or seek an in-person evaluation if the symptoms worsen or if the condition fails to improve as anticipated.   I spent 25 minutes on the day of the encounter to include pre-visit record review, face-to-face time with the patient and post visit ordering of test.  Beatrice Lecher, MD

## 2021-09-19 NOTE — Progress Notes (Signed)
Pt would like to discuss possible iron infusions and labs

## 2021-09-20 LAB — CBC
HCT: 36.4 % (ref 35.0–45.0)
Hemoglobin: 11.8 g/dL (ref 11.7–15.5)
MCH: 28.4 pg (ref 27.0–33.0)
MCHC: 32.4 g/dL (ref 32.0–36.0)
MCV: 87.5 fL (ref 80.0–100.0)
MPV: 10.6 fL (ref 7.5–12.5)
Platelets: 348 10*3/uL (ref 140–400)
RBC: 4.16 10*6/uL (ref 3.80–5.10)
RDW: 13.3 % (ref 11.0–15.0)
WBC: 6.8 10*3/uL (ref 3.8–10.8)

## 2021-09-20 LAB — COMPLETE METABOLIC PANEL WITH GFR
AG Ratio: 1.6 (calc) (ref 1.0–2.5)
ALT: 16 U/L (ref 6–29)
AST: 13 U/L (ref 10–35)
Albumin: 3.9 g/dL (ref 3.6–5.1)
Alkaline phosphatase (APISO): 71 U/L (ref 37–153)
BUN: 16 mg/dL (ref 7–25)
CO2: 28 mmol/L (ref 20–32)
Calcium: 9.5 mg/dL (ref 8.6–10.4)
Chloride: 101 mmol/L (ref 98–110)
Creat: 0.87 mg/dL (ref 0.60–1.00)
Globulin: 2.4 g/dL (calc) (ref 1.9–3.7)
Glucose, Bld: 98 mg/dL (ref 65–99)
Potassium: 4.5 mmol/L (ref 3.5–5.3)
Sodium: 137 mmol/L (ref 135–146)
Total Bilirubin: 0.3 mg/dL (ref 0.2–1.2)
Total Protein: 6.3 g/dL (ref 6.1–8.1)
eGFR: 69 mL/min/{1.73_m2} (ref >=60)

## 2021-09-20 LAB — IRON,TIBC AND FERRITIN PANEL
%SAT: 10 % (calc) — ABNORMAL LOW (ref 16–45)
Ferritin: 38 ng/mL (ref 16–288)
Iron: 33 ug/dL — ABNORMAL LOW (ref 45–160)
TIBC: 342 mcg/dL (calc) (ref 250–450)

## 2021-09-20 NOTE — Telephone Encounter (Signed)
Task completed. Patient completed lab order on 09/19/21.

## 2021-09-20 NOTE — Progress Notes (Signed)
Hi [name], iron levels are still on the low side.  Continue taking the extra iron for the next month.  The good news is that your hemoglobin looks great it has bounced back to 11.8.  It is not quite at 12.8 which is where it was a month ago, but it is trending up which is fantastic.  We can always recheck your hemoglobin in 2 months.  Metabolic panel is normal.

## 2021-09-28 ENCOUNTER — Ambulatory Visit: Payer: Medicare Other | Admitting: Family Medicine

## 2021-10-11 DIAGNOSIS — Z96652 Presence of left artificial knee joint: Secondary | ICD-10-CM | POA: Diagnosis not present

## 2021-10-11 DIAGNOSIS — Z471 Aftercare following joint replacement surgery: Secondary | ICD-10-CM | POA: Diagnosis not present

## 2021-10-19 ENCOUNTER — Other Ambulatory Visit: Payer: Self-pay | Admitting: Family Medicine

## 2021-10-30 ENCOUNTER — Encounter: Payer: Self-pay | Admitting: General Practice

## 2021-11-03 ENCOUNTER — Ambulatory Visit (INDEPENDENT_AMBULATORY_CARE_PROVIDER_SITE_OTHER): Payer: Medicare Other | Admitting: Family Medicine

## 2021-11-03 VITALS — BP 126/61 | HR 82 | Ht 61.0 in | Wt 179.0 lb

## 2021-11-03 DIAGNOSIS — R7301 Impaired fasting glucose: Secondary | ICD-10-CM | POA: Diagnosis not present

## 2021-11-03 DIAGNOSIS — I10 Essential (primary) hypertension: Secondary | ICD-10-CM

## 2021-11-03 DIAGNOSIS — E038 Other specified hypothyroidism: Secondary | ICD-10-CM

## 2021-11-03 DIAGNOSIS — E611 Iron deficiency: Secondary | ICD-10-CM | POA: Diagnosis not present

## 2021-11-03 LAB — POCT GLYCOSYLATED HEMOGLOBIN (HGB A1C): HbA1c POC (<> result, manual entry): 5.7 % (ref 4.0–5.6)

## 2021-11-03 NOTE — Progress Notes (Unsigned)
   Established Patient Office Visit  Subjective   Patient ID: [name], female    DOB: [birth year]  Age: 76 y.o. MRN: [id number]  Chief Complaint  Patient presents with   Follow-up    HPI  Hypertension- Pt denies chest pain, SOB, dizziness, or heart palpitations.  Taking meds as directed w/o problems.  Denies medication side effects.   Impaired fasting glucose-no increased thirst or urination. No symptoms consistent with hypoglycemia.  Has been using weight watchers.  Hypothyroidism - Taking medication regularly in the AM away from food and vitamins, etc. No recent change to skin, hair, or energy levels.  Labs were drawn in June when she had a preoperative physical.  Last A1c 6.1.   Lab Results  Component Value Date   HGBA1C 5.7 11/03/2021     {History (Optional):23778}  ROS    Objective:     BP 126/61   Pulse 82   Ht '5\' 1"'$  (1.549 m)   Wt 179 lb (81.2 kg)   SpO2 97%   BMI 33.82 kg/m  {Vitals History (Optional):23777}  Physical Exam Vitals and nursing note reviewed.  Constitutional:      Appearance: She is well-developed.  HENT:     Head: Normocephalic and atraumatic.  Cardiovascular:     Rate and Rhythm: Normal rate and regular rhythm.     Heart sounds: Normal heart sounds.  Pulmonary:     Effort: Pulmonary effort is normal.     Breath sounds: Normal breath sounds.  Skin:    General: Skin is warm and dry.  Neurological:     Mental Status: She is alert and oriented to person, place, and time.  Psychiatric:        Behavior: Behavior normal.      Results for orders placed or performed in visit on 11/03/21  POCT HgB A1C  Result Value Ref Range   Hemoglobin A1C     HbA1c POC (<> result, manual entry) 5.7 4.0 - 5.6 %   HbA1c, POC (prediabetic range)     HbA1c, POC (controlled diabetic range)      {Labs (Optional):23779}  The 10-year ASCVD risk score (Arnett DK, et al., 2019) is: 42.8%    Assessment & Plan:   Problem List Items Addressed  This Visit       Cardiovascular and Mediastinum   Essential hypertension, benign   Relevant Orders   Fe+TIBC+Fer   TSH   COMPLETE METABOLIC PANEL WITH GFR     Endocrine   IFG (impaired fasting glucose) - Primary   Relevant Orders   POCT HgB A1C (Completed)   Fe+TIBC+Fer   TSH   COMPLETE METABOLIC PANEL WITH GFR   Hypothyroidism   Other Visit Diagnoses     Iron deficiency       Relevant Orders   Fe+TIBC+Fer   TSH   COMPLETE METABOLIC PANEL WITH GFR   CBC       No follow-ups on file.    Beatrice Lecher, MD

## 2021-11-06 ENCOUNTER — Encounter: Payer: Self-pay | Admitting: Family Medicine

## 2021-11-06 DIAGNOSIS — E611 Iron deficiency: Secondary | ICD-10-CM | POA: Insufficient documentation

## 2021-11-06 NOTE — Assessment & Plan Note (Signed)
1C looks fantastic today.  Follow-up in 6 months continue to work on healthy food choices and staying active.  Lab Results  Component Value Date   HGBA1C 5.7 11/03/2021

## 2021-11-06 NOTE — Assessment & Plan Note (Signed)
Well controlled. Continue current regimen. Follow up in  6 mo  

## 2021-11-06 NOTE — Assessment & Plan Note (Signed)
Due to recheck iron levels.

## 2021-11-06 NOTE — Assessment & Plan Note (Signed)
Due to recheck thyroid levels today to make sure appropriate.

## 2021-11-07 ENCOUNTER — Other Ambulatory Visit: Payer: Self-pay | Admitting: Family Medicine

## 2021-11-08 DIAGNOSIS — I10 Essential (primary) hypertension: Secondary | ICD-10-CM | POA: Diagnosis not present

## 2021-11-08 DIAGNOSIS — E611 Iron deficiency: Secondary | ICD-10-CM | POA: Diagnosis not present

## 2021-11-08 DIAGNOSIS — R7301 Impaired fasting glucose: Secondary | ICD-10-CM | POA: Diagnosis not present

## 2021-11-09 LAB — CBC
HCT: 36.5 % (ref 35.0–45.0)
Hemoglobin: 11.9 g/dL (ref 11.7–15.5)
MCH: 27.5 pg (ref 27.0–33.0)
MCHC: 32.6 g/dL (ref 32.0–36.0)
MCV: 84.5 fL (ref 80.0–100.0)
MPV: 11.7 fL (ref 7.5–12.5)
Platelets: 347 10*3/uL (ref 140–400)
RBC: 4.32 10*6/uL (ref 3.80–5.10)
RDW: 13 % (ref 11.0–15.0)
WBC: 6.4 10*3/uL (ref 3.8–10.8)

## 2021-11-09 LAB — TSH: TSH: 0.45 mIU/L (ref 0.40–4.50)

## 2021-11-09 LAB — COMPLETE METABOLIC PANEL WITH GFR
AG Ratio: 1.8 (calc) (ref 1.0–2.5)
ALT: 16 U/L (ref 6–29)
AST: 14 U/L (ref 10–35)
Albumin: 4.1 g/dL (ref 3.6–5.1)
Alkaline phosphatase (APISO): 64 U/L (ref 37–153)
BUN: 20 mg/dL (ref 7–25)
CO2: 29 mmol/L (ref 20–32)
Calcium: 9.4 mg/dL (ref 8.6–10.4)
Chloride: 100 mmol/L (ref 98–110)
Creat: 0.96 mg/dL (ref 0.60–1.00)
Globulin: 2.3 g/dL (calc) (ref 1.9–3.7)
Glucose, Bld: 100 mg/dL — ABNORMAL HIGH (ref 65–99)
Potassium: 4.2 mmol/L (ref 3.5–5.3)
Sodium: 137 mmol/L (ref 135–146)
Total Bilirubin: 0.3 mg/dL (ref 0.2–1.2)
Total Protein: 6.4 g/dL (ref 6.1–8.1)
eGFR: 61 mL/min/{1.73_m2} (ref >=60)

## 2021-11-09 LAB — IRON,TIBC AND FERRITIN PANEL
%SAT: 14 % (calc) — ABNORMAL LOW (ref 16–45)
Ferritin: 21 ng/mL (ref 16–288)
Iron: 53 ug/dL (ref 45–160)
TIBC: 376 mcg/dL (calc) (ref 250–450)

## 2021-11-09 NOTE — Progress Notes (Signed)
Hi [name], iron levels are on the low end of normal.  Your total iron is up compared to last month but your ferritin is a little bit on the low end still.  Are you still taking your extra iron?  If you are then we may need to try to increase to twice a day again you may need a stool softener with it.Your metabolic panel and thyroid is normal. Blood count is normal.

## 2021-11-10 ENCOUNTER — Other Ambulatory Visit: Payer: Self-pay

## 2021-11-10 DIAGNOSIS — E611 Iron deficiency: Secondary | ICD-10-CM

## 2021-11-10 NOTE — Progress Notes (Signed)
OK let refer her to hemo/onc for consideration of iron infusion.

## 2021-11-10 NOTE — Progress Notes (Signed)
Referral placed per result note.

## 2021-11-21 DIAGNOSIS — R0789 Other chest pain: Secondary | ICD-10-CM | POA: Diagnosis not present

## 2021-11-21 DIAGNOSIS — I10 Essential (primary) hypertension: Secondary | ICD-10-CM | POA: Diagnosis not present

## 2021-11-21 DIAGNOSIS — Z885 Allergy status to narcotic agent status: Secondary | ICD-10-CM | POA: Diagnosis not present

## 2021-11-21 DIAGNOSIS — E079 Disorder of thyroid, unspecified: Secondary | ICD-10-CM | POA: Diagnosis not present

## 2021-11-21 DIAGNOSIS — R404 Transient alteration of awareness: Secondary | ICD-10-CM | POA: Diagnosis not present

## 2021-11-21 DIAGNOSIS — R0689 Other abnormalities of breathing: Secondary | ICD-10-CM | POA: Diagnosis not present

## 2021-11-21 DIAGNOSIS — R079 Chest pain, unspecified: Secondary | ICD-10-CM | POA: Diagnosis not present

## 2021-11-21 DIAGNOSIS — Z743 Need for continuous supervision: Secondary | ICD-10-CM | POA: Diagnosis not present

## 2021-11-21 DIAGNOSIS — R6889 Other general symptoms and signs: Secondary | ICD-10-CM | POA: Diagnosis not present

## 2021-11-21 DIAGNOSIS — Z888 Allergy status to other drugs, medicaments and biological substances status: Secondary | ICD-10-CM | POA: Diagnosis not present

## 2021-11-23 ENCOUNTER — Telehealth: Payer: Self-pay | Admitting: Neurology

## 2021-11-23 NOTE — Progress Notes (Signed)
Cardiology Clinic Note   Patient Name: [name] Date of Encounter: 11/24/2021  Primary Care Provider:  Hali Marry, MD Primary Cardiologist:  Kirk Ruths, MD  Patient Profile    [name] 76 year old female presents to the clinic today for follow-up evaluation of her chest discomfort.  Past Medical History    Past Medical History:  Diagnosis Date   Allergic rhinitis, cause unspecified    Anxiety    Asthma    Benign paroxysmal positional vertigo    Cancer (HCC)    basal cell carcinoma per right side of nostril   CHF (congestive heart failure) ([place]) 11/09/2016   had 2+ edema in legs with anxiety and SOB   Complication of anesthesia    has 4  degenerative discs in neck.   Depressive disorder, not elsewhere classified    Esophageal reflux    History of hiatal hernia    Hypertension    Localized osteoarthrosis not specified whether primary or secondary, lower leg    Other and unspecified hyperlipidemia    Other diseases of vocal cords    Peptic ulcer, unspecified site, unspecified as acute or chronic, without mention of hemorrhage, perforation, or obstruction    Pneumonia    bilat pneumonia 1987   Pre-diabetes    no meds,just diet controll   TMJ (temporomandibular joint disorder)    Unspecified arthropathy, hand    Unspecified asthma(493.90)    triggered with a virus    Unspecified hypothyroidism    Past Surgical History:  Procedure Laterality Date   BREAST BIOPSY  09/07/2011   High Risk Lesion   BREAST EXCISIONAL BIOPSY Left    BREAST SURGERY     LUMPECTOMY / LEFT 10/12/2011   KNEE ARTHROSCOPY     bilat    NASAL SINUS SURGERY     ROTATOR CUFF REPAIR     right    thumb surgery      left hand 45 years ago    TOTAL HIP ARTHROPLASTY Right 08/10/2014   Procedure: RIGHT TOTAL HIP ARTHROPLASTY ANTERIOR APPROACH;  Surgeon: Paralee Cancel, MD;  Location: [facility];  Service: Orthopedics;  Laterality: Right;   TOTAL HIP ARTHROPLASTY Left  08/22/2021   Procedure: TOTAL HIP ARTHROPLASTY ANTERIOR APPROACH;  Surgeon: Paralee Cancel, MD;  Location: [facility];  Service: Orthopedics;  Laterality: Left;   TOTAL KNEE ARTHROPLASTY Left 08/26/2018   Procedure: TOTAL KNEE ARTHROPLASTY;  Surgeon: Paralee Cancel, MD;  Location: [facility];  Service: Orthopedics;  Laterality: Left;  70 mins    Allergies  Allergies  Allergen Reactions   Astelin [Azelastine Hcl] Other (See Comments)    Headache   Celebrex [Celecoxib] Other (See Comments)    Stomach issues   Lisinopril Other (See Comments) and Cough    ACE cough   Methotrexate Derivatives Other (See Comments)   Morphine Nausea Only   Moxifloxacin Other (See Comments)    REACTION: confusion, dizziness, paranoia    History of Present Illness    Shakira Los is a PMH of aortic atherosclerosis, hyperlipidemia, and precordial chest discomfort.  Her echocardiogram 5/15 showed normal LV function with trace aortic insufficiency and mild mitral enlargement.  Her treadmill stress test 8/19 was negative however, she did have poor exercise tolerance at 4 minutes and 40 seconds.  Her abdominal CT 8/21 showed aortic atherosclerosis but no aneurysm.  Nuclear stress test 9/22 showed normal EF 72%, apical thinning but no ischemia.  She was seen in follow-up by Dr. Stanford Breed on 07/11/2021.  During  that time she denied increased dyspnea with exertion.  She denied orthopnea, PND, lower extremity swelling, chest pain, and syncope.  She was in the process of having hip replacement surgery.  She was seen for evaluation of chest discomfort on 11/21/2021.  She was evaluated at [facility].  She felt that it was related to anxiety dealing with her grandson.  Her EKG is normal.  Her ALT was slightly elevated at 47.  Chest x-ray showed no acute cardiopulmonary disease.  Her cardiac troponin was noted to be 15.  She presents to the clinic today for follow-up evaluation and states she noted intense chest pain and soreness  the day after her episode.  We reviewed her trip to the emergency room at [facility].  We reviewed her chest x-ray and cardiac enzymes.  She expressed understanding.  She indicates that she had increased anxiety related to her grandson who is a very energetic that day.  She also reports continued anxiety related to spousal issues.  She is taking Paxil and Wellbutrin.  She was reassured that her discomfort was related to musculoskeletal type discomfort.  We also reviewed her previous stress testing and she expressed understanding.  I will continue her current medication regimen, have her review mindfulness stress reduction sheet and plan follow-up in 4 months.    Today She denies chest pain, shortness of breath, lower extremity edema, fatigue, palpitations, melena, hematuria, hemoptysis, diaphoresis, weakness, presyncope, syncope, orthopnea, and PND.   Home Medications    Prior to Admission medications   Medication Sig Start Date End Date Taking? Authorizing Provider  albuterol (VENTOLIN HFA) 108 (90 Base) MCG/ACT inhaler INHALE 2 PUFFS INTO THE LUNGS EVERY 6 (SIX) HOURS AS NEEDED FOR SHORTNESS OF BREATH. 11/30/20   Rigoberto Noel, MD  Ascorbic Acid (VITAMIN C WITH ROSE HIPS) 1000 MG tablet Take 1,000 mg by mouth daily.    [provider]  benzonatate (TESSALON) 200 MG capsule Take 1 capsule (200 mg total) by mouth 3 (three) times daily as needed for cough. 05/30/21 05/30/22  Parrett, Fonnie Mu, NP  buPROPion (WELLBUTRIN XL) 300 MG 24 hr tablet Take 300 mg by mouth daily. 08/25/20   [provider]  Calcium Carb-Cholecalciferol (CALCIUM 600+D3 PO) Take 1 tablet by mouth daily.    [provider]  Carboxymethylcell-Hypromellose (GENTEAL OP) Place 1-2 drops into both eyes 3 (three) times daily as needed (for dry eyes).    [provider]  Cholecalciferol (VITAMIN D3) 50 MCG (2000 UT) capsule Take 2,000 Units by mouth 3 (three) times a week.    [provider]   clobetasol ointment (TEMOVATE) 1.82 % Apply 1 Application topically 2 (two) times daily as needed. 08/03/21   Hali Marry, MD  clonazePAM (KLONOPIN) 0.5 MG tablet Take 0.125 mg by mouth daily as needed for anxiety.    [provider]  famotidine (PEPCID) 20 MG tablet Take 20 mg by mouth at bedtime.     [provider]  fexofenadine (ALLEGRA) 180 MG tablet Take 180 mg by mouth at bedtime.    [provider]  folic acid (FOLVITE) 1 MG tablet Take 1 mg by mouth 2 (two) times daily.    [provider]  Ginkgo Biloba (GNP GINGKO BILOBA EXTRACT PO) Take 1 tablet by mouth 3 (three) times daily with meals.    [provider]  Guaifenesin (MUCINEX MAXIMUM STRENGTH) 1200 MG TB12 Take 1,200 mg by mouth in the morning and at bedtime.    [provider]  hydrochlorothiazide (HYDRODIURIL) 25 MG tablet TAKE 1 TABLET BY MOUTH EVERY DAY 06/28/21   Hali Marry, MD  Lido-Menthol-Methyl Sal-Camph (CBD KINGS EX) Apply 1 Application topically in the morning and at bedtime.    [provider]  losartan (COZAAR) 50 MG tablet TAKE 1 TABLET BY MOUTH EVERY DAY 10/20/21   Hali Marry, MD  Magnesium 100 MG TABS Take 100 mg by mouth 2 (two) times a day.    [provider]  meloxicam (MOBIC) 15 MG tablet TAKE 1 TABLET BY MOUTH EVERY DAY IN THE MORNING 11/07/21   Hali Marry, MD  Menthol, Topical Analgesic, (BIOFREEZE EX) Apply 1 Application topically daily as needed (pain).    [provider]  methocarbamol (ROBAXIN) 500 MG tablet Take 1 tablet (500 mg total) by mouth every 6 (six) hours as needed for muscle spasms. 08/23/21   Irving Copas, PA-C  metroNIDAZOLE (METROGEL) 1 % gel Apply topically daily. Patient taking differently: Apply 1 application  topically daily as needed (facial outbreaks). 09/22/18   Hali Marry, MD  pantoprazole (PROTONIX) 40 MG tablet Take 1 tablet (40 mg total) by mouth daily  before breakfast. 11/07/21   Hali Marry, MD  PAXIL CR 12.5 MG 24 hr tablet Take 12.5 mg by mouth daily. 03/02/21   [provider]  Polyethyl Glycol-Propyl Glycol (SYSTANE ULTRA PF OP) Place 1 drop into both eyes daily as needed (dry eyes).    [provider]  polyethylene glycol (MIRALAX / GLYCOLAX) 17 g packet Take 17 g by mouth daily as needed for mild constipation. 08/23/21   Irving Copas, PA-C  SYMBICORT 80-4.5 MCG/ACT inhaler TAKE 2 PUFFS BY MOUTH TWICE A DAY 08/08/21   Rigoberto Noel, MD  SYNTHROID 75 MCG tablet TAKE 1 TABLET IN AM ON EMPTY STOMACH 6 DAYS PER WEEK FOR THYROID (NAME BRAND ONLY DAW) Patient taking differently: Take 75 mcg by mouth daily before breakfast. 07/04/21   Hali Marry, MD    Family History    Family History  Problem Relation Age of Onset   Heart disease Father    Colon cancer Mother    Liver cancer Brother    Asthma Brother    She indicated that her mother is deceased. She indicated that her father is deceased.  Social History    Social History   Socioeconomic History   Marital status: Married    Spouse name: [name]   Number of children: 3   Years of education: 16   Highest education level: Bachelor's degree (e.g., BA, AB, BS)  Occupational History   Occupation: Retired    Comment: Pharmacist, hospital  Tobacco Use   Smoking status: Former    Packs/day: 0.30    Years: 2.00    Total pack years: 0.60    Types: Cigarettes    Quit date: 02/12/1958    Years since quitting: 63.8   Smokeless tobacco: Never  Vaping Use   Vaping Use: Never used  Substance and Sexual Activity   Alcohol use: Yes    Alcohol/week: 1.0 standard drink of alcohol    Types: 1 Glasses of wine per week    Comment: wine socially   Drug use: No   Sexual activity: Yes  Other Topics Concern   Not on file  Social History Narrative   Lives with her husband. She has three children. She enjoys spending time with her grandchildren and talking to her  friends. When she does have some  free time, she enjoys crocheting.    Social Determinants of Health   Financial Resource Strain: Low Risk  (11/25/2020)   Overall Financial Resource Strain (CARDIA)    Difficulty of Paying Living Expenses: Not hard at all  Food Insecurity: No Food Insecurity (11/25/2020)   Hunger Vital Sign    Worried About Running Out of Food in the Last Year: Never true    Ran Out of Food in the Last Year: Never true  Transportation Needs: No Transportation Needs (11/25/2020)   PRAPARE - Hydrologist (Medical): No    Lack of Transportation (Non-Medical): No  Physical Activity: Inactive (11/25/2020)   Exercise Vital Sign    Days of Exercise per Week: 0 days    Minutes of Exercise per Session: 0 min  Stress: No Stress Concern Present (11/25/2020)   [place]    Feeling of Stress : Not at all  Social Connections: [place] (11/25/2020)   Social Connection and Isolation Panel [NHANES]    Frequency of Communication with Friends and Family: More than three times a week    Frequency of Social Gatherings with Friends and Family: More than three times a week    Attends Religious Services: More than 4 times per year    Active Member of [employer] or Organizations: Yes    Attends Archivist Meetings: Never    Marital Status: Married  Human resources officer Violence: Not At Risk (11/25/2020)   Humiliation, Afraid, Rape, and Kick questionnaire    Fear of Current or Ex-Partner: No    Emotionally Abused: No    Physically Abused: No    Sexually Abused: No     Review of Systems    General:  No chills, fever, night sweats or weight changes.  Cardiovascular:  No chest pain, dyspnea on exertion, edema, orthopnea, palpitations, paroxysmal nocturnal dyspnea. Dermatological: No rash, lesions/masses Respiratory: No cough, dyspnea Urologic: No hematuria, dysuria Abdominal:   No  nausea, vomiting, diarrhea, bright red blood per rectum, melena, or hematemesis Neurologic:  No visual changes, wkns, changes in mental status. All other systems reviewed and are otherwise negative except as noted above.  Physical Exam    VS:  BP (!) 108/56   Pulse 70   Ht '5\' 2"'$  (1.575 m)   Wt 177 lb (80.3 kg)   SpO2 96%   BMI 32.37 kg/m  , BMI Body mass index is 32.37 kg/m. GEN: Well nourished, well developed, in no acute distress. HEENT: normal. Neck: Supple, no JVD, carotid bruits, or masses. Cardiac: RRR, no murmurs, rubs, or gallops. No clubbing, cyanosis, edema.  Radials/DP/PT 2+ and equal bilaterally.  Respiratory:  Respirations regular and unlabored, clear to auscultation bilaterally. GI: Soft, nontender, nondistended, BS + x 4. MS: no deformity or atrophy. Skin: warm and dry, no rash. Neuro:  Strength and sensation are intact. Psych: Normal affect.  Accessory Clinical Findings    Recent Labs: 11/08/2021: ALT 16; BUN 20; Creat 0.96; Hemoglobin 11.9; Platelets 347; Potassium 4.2; Sodium 137; TSH 0.45   Recent Lipid Panel    Component Value Date/Time   CHOL 207 (H) 03/28/2021 0000   TRIG 129 03/28/2021 0000   HDL 56 03/28/2021 0000   CHOLHDL 3.7 03/28/2021 0000   VLDL 16 09/18/2006 1225   LDLCALC 127 (H) 03/28/2021 0000   LDLDIRECT 146.7 09/18/2006 1225         ECG personally reviewed by me today-none today.  Echocardiogram 06/23/2013  Study Conclusions   - Left ventricle: The cavity size was normal. Wall thickness    was normal. Systolic function was normal. The estimated    ejection fraction was in the range of 60% to 65%. There    was an increased relative contribution of atrial    contraction to ventricular filling.  - Aortic valve: Trivial regurgitation.  - Left atrium: The atrium was mildly dilated.  - Pericardium, extracardiac: A trivial pericardial effusion    was identified.  Transthoracic echocardiography.  M-mode, complete 2D,  spectral  Doppler, and color Doppler.  Height:  Height:  160cm. Height: 63in.  Weight:  Weight: 79.4kg. Weight:  174.6lb.  Body mass index:  BMI: 31kg/m^2.  Body surface  area:    BSA: 1.65m2.  Blood pressure:     142/84.  Patient  status:  Outpatient.  Location:  Echo laboratory.    Nuclear stress test 10/18/2020    The study is normal. The study is low risk.   No ST deviation was noted.   LV perfusion is normal. There is no evidence of ischemia. There is no evidence of infarction.   Left ventricular function is normal. Nuclear stress EF: 72 %. The left ventricular ejection fraction is hyperdynamic (>65%). End diastolic cavity size is normal. End systolic cavity size is normal.   Prior study available for comparison from 10/08/2017.   Fixed apical perfusion defect with normal wall motion, consistent with artifact Low risk study  Assessment & Plan   1.  Chest discomfort-no chest pain today.  [facility]  EKG showed no ischemia.  Troponin was noted to be 15.  This was in the setting of stress related to her grandson.  She underwent nuclear stress test 9/22 which showed an EF of 72%, apical thinning and no ischemia. Apperars to be MSK in nature Continue to monitor. Heart healthy low-sodium diet-salty 6 given Increase physical activity as tolerated Mindfulness stress reduction And plans for ischemic evaluation at this time.  Hyperlipidemia-LDL 127 03/28/21 Heart healthy low-sodium high-fiber diet Increase physical activity as tolerated Order carotid dopplers   Aortic atherosclerosis-noted on abdominal CT 8/21. Heart healthy low-sodium high-fiber diet Increase physical activity as tolerated  Precordial chest pain-no chest pain today.  Nuclear stress test 9/22 showed an EF of 72% with apical thinning and no ischemia.  Denies exertional chest discomfort. Continue current medical therapy  Disposition: Follow-up with Dr. CStanford Breedin 6-9  months.   JJossie Ng Gabrielle Mester NP-C     11/24/2021, 3:32 PM [place] Office [phone] [phone] Notice: This dictation was prepared with Dragon dictation along with smaller phrase technology. Any transcriptional errors that result from this process are unintentional and may not be corrected upon review.  I spent 14 minutes examining this patient, reviewing medications, and using patient centered shared decision making involving her cardiac care.  Prior to her visit I spent greater than 20 minutes reviewing her past medical history,  medications, and prior cardiac tests.

## 2021-11-23 NOTE — Telephone Encounter (Signed)
Patient left vm yesterday wanting you to know she went to [facility] with heart attack symptoms. Blood work was okay and has an appt scheduled with Dr. Stanford Breed on Friday. She just wanted you aware. FYI

## 2021-11-24 ENCOUNTER — Encounter: Payer: Self-pay | Admitting: General Practice

## 2021-11-24 ENCOUNTER — Other Ambulatory Visit: Payer: Self-pay | Admitting: *Deleted

## 2021-11-24 ENCOUNTER — Ambulatory Visit: Payer: Medicare Other | Attending: General Practice | Admitting: General Practice

## 2021-11-24 VITALS — BP 108/56 | HR 70 | Ht 62.0 in | Wt 177.0 lb

## 2021-11-24 DIAGNOSIS — I700 Atherosclerosis of aorta: Secondary | ICD-10-CM | POA: Insufficient documentation

## 2021-11-24 DIAGNOSIS — E7800 Pure hypercholesterolemia, unspecified: Secondary | ICD-10-CM

## 2021-11-24 DIAGNOSIS — R0789 Other chest pain: Secondary | ICD-10-CM | POA: Diagnosis not present

## 2021-11-24 DIAGNOSIS — R072 Precordial pain: Secondary | ICD-10-CM

## 2021-11-24 NOTE — Patient Instructions (Signed)
Medication Instructions:  The current medical regimen is effective;  continue present plan and medications as directed. Please refer to the Current Medication list given to you today.  *If you need a refill on your cardiac medications before your next appointment, please call your pharmacy*   Lab Work: NONE  Other Instructions: SEE ATTACHED MINDFUL STRESS REDUCTION TIPS ATTACHED  Follow-Up: At [facility], you and your health needs are our priority.  As part of our continuing mission to provide you with exceptional heart care, we have created designated Provider Care Teams.  These Care Teams include your primary Cardiologist (physician) and Advanced Practice Providers (APPs -  Physician Assistants and Nurse Practitioners) who all work together to provide you with the care you need, when you need it.  Your next appointment:   4 month(s)  The format for your next appointment:   In Person  Provider:   Kirk Ruths, MD  or Coletta Memos, FNP        Important Information About Sugar       Mindfulness-Based Stress Reduction Mindfulness-based stress reduction (MBSR) is a program that helps people learn to practice mindfulness. Mindfulness is the practice of consciously paying attention to the present moment. MBSR focuses on developing self-awareness, which lets you respond to life stress without judgment or negative feelings. It can be learned and practiced through techniques such as education, breathing exercises, meditation, and yoga. MBSR includes several mindfulness techniques in one program. MBSR works best when you understand the treatment, are willing to try new things, and can commit to spending time practicing what you learn. MBSR training may include learning about: How your feelings, thoughts, and reactions affect your body. New ways to respond to things that cause negative thoughts to start (triggers). How to notice your thoughts and let go of them. Practicing  awareness of everyday things that you normally do without thinking. The techniques and goals of different types of meditation. What are the benefits of MBSR? MBSR can have many benefits, which include helping you to: Develop self-awareness. This means knowing and understanding yourself. Learn skills and attitudes that help you to take part in your own health care. Learn new ways to care for yourself. Be more accepting about how things are, and let things go. Be less judgmental and approach things with an open mind. Be patient with yourself and trust yourself more. MBSR has also been shown to: Reduce negative emotions, such as sadness, overwhelm, and worry. Improve memory and focus. Change how you sense and react to pain. Boost your body's ability to fight infections. Help you connect better with other people. Improve your sense of well-being. How to practice mindfulness To do a basic awareness exercise: Find a comfortable place to sit. Pay attention to the present moment. Notice your thoughts, feelings, and surroundings just as they are. Avoid judging yourself, your feelings, or your surroundings. Make note of any judgment that comes up and let it go. Your mind may wander, and that is okay. Make note of when your thoughts drift, and return your attention to the present moment. To do basic mindfulness meditation: Find a comfortable place to sit. This may include a stable chair or a firm floor cushion. Sit upright with your back straight. Let your arms fall next to your sides, with your hands resting on your legs. If you are sitting in a chair, rest your feet flat on the floor. If you are sitting on a cushion, cross your legs in front of  you. Keep your head in a neutral position with your chin dropped slightly. Relax your jaw and rest the tip of your tongue on the roof of your mouth. Drop your gaze to the floor or close your eyes. Breathe normally and pay attention to your breath. Feel the  air moving in and out of your nose. Feel your belly expanding and relaxing with each breath. Your mind may wander, and that is okay. Make note of when your thoughts drift, and return your attention to your breath. Avoid judging yourself, your feelings, or your surroundings. Make note of any judgment or feelings that come up, let them go, and bring your attention back to your breath. When you are ready, lift your gaze or open your eyes. Pay attention to how your body feels after the meditation. Follow these instructions at home:  Find a local in-person or online MBSR program. Set aside some time regularly for mindfulness practice. Practice every day if you can. Even 10 minutes of practice is helpful. Find a mindfulness practice that works best for you. This may include one or more of the following: Meditation. This involves focusing your mind on a certain thought or activity. Breathing awareness exercises. These help you to stay present by focusing on your breath. Body scan. For this practice, you lie down and pay attention to each part of your body from head to toe. You can identify tension and soreness and consciously relax parts of your body. Yoga. Yoga involves stretching and breathing, and it can improve your ability to move and be flexible. It can also help you to test your body's limits, which can help you release stress. Mindful eating. This way of eating involves focusing on the taste, texture, color, and smell of each bite of food. This slows down eating and helps you feel full sooner. For this reason, it can be an important part of a weight loss plan. Find a podcast or recording that provides guidance for breathing awareness, body scan, or meditation exercises. You can listen to these any time when you have a free moment to rest without distractions. Follow your treatment plan as told by your health care provider. This may include taking regular medicines and making changes to your diet or  lifestyle as recommended. Where to find more information You can find more information about MBSR from: Your health care provider. Community-based meditation centers or programs. Programs offered near you. Summary Mindfulness-based stress reduction (MBSR) is a program that teaches you how to consciously pay attention to the present moment. It is used to help you deal better with daily stress, feelings, and pain. MBSR focuses on developing self-awareness, which allows you to respond to life stress without judgment or negative feelings. MBSR programs may involve learning different mindfulness practices, such as breathing exercises, meditation, yoga, body scan, or mindful eating. Find a mindfulness practice that works best for you, and set aside time for it on a regular basis. This information is not intended to replace advice given to you by your health care provider. Make sure you discuss any questions you have with your health care provider. Document Revised: 09/08/2020 Document Reviewed: 09/08/2020 Elsevier Patient Education  [place].

## 2021-11-27 ENCOUNTER — Other Ambulatory Visit: Payer: Self-pay

## 2021-11-27 ENCOUNTER — Inpatient Hospital Stay: Payer: Medicare Other | Attending: Hematology & Oncology

## 2021-11-27 ENCOUNTER — Other Ambulatory Visit: Payer: Self-pay | Admitting: Family

## 2021-11-27 ENCOUNTER — Encounter: Payer: Self-pay | Admitting: Family

## 2021-11-27 ENCOUNTER — Inpatient Hospital Stay (HOSPITAL_BASED_OUTPATIENT_CLINIC_OR_DEPARTMENT_OTHER): Payer: Medicare Other | Admitting: Family

## 2021-11-27 VITALS — BP 119/91 | HR 71 | Temp 97.9°F | Resp 18 | Ht 61.0 in | Wt 176.4 lb

## 2021-11-27 DIAGNOSIS — D509 Iron deficiency anemia, unspecified: Secondary | ICD-10-CM | POA: Diagnosis not present

## 2021-11-27 DIAGNOSIS — Z808 Family history of malignant neoplasm of other organs or systems: Secondary | ICD-10-CM | POA: Diagnosis not present

## 2021-11-27 DIAGNOSIS — D649 Anemia, unspecified: Secondary | ICD-10-CM

## 2021-11-27 DIAGNOSIS — Z800 Family history of malignant neoplasm of digestive organs: Secondary | ICD-10-CM | POA: Diagnosis not present

## 2021-11-27 DIAGNOSIS — Z96642 Presence of left artificial hip joint: Secondary | ICD-10-CM | POA: Diagnosis not present

## 2021-11-27 DIAGNOSIS — E611 Iron deficiency: Secondary | ICD-10-CM

## 2021-11-27 DIAGNOSIS — Z87891 Personal history of nicotine dependence: Secondary | ICD-10-CM | POA: Insufficient documentation

## 2021-11-27 DIAGNOSIS — Z79899 Other long term (current) drug therapy: Secondary | ICD-10-CM | POA: Diagnosis not present

## 2021-11-27 DIAGNOSIS — E039 Hypothyroidism, unspecified: Secondary | ICD-10-CM | POA: Diagnosis not present

## 2021-11-27 LAB — CMP (CANCER CENTER ONLY)
ALT: 20 U/L (ref 0–44)
AST: 15 U/L (ref 15–41)
Albumin: 4.1 g/dL (ref 3.5–5.0)
Alkaline Phosphatase: 52 U/L (ref 38–126)
Anion gap: 7 (ref 5–15)
BUN: 21 mg/dL (ref 8–23)
CO2: 30 mmol/L (ref 22–32)
Calcium: 10 mg/dL (ref 8.9–10.3)
Chloride: 101 mmol/L (ref 98–111)
Creatinine: 0.86 mg/dL (ref 0.44–1.00)
GFR, Estimated: >60 mL/min (ref >60)
Glucose, Bld: 87 mg/dL (ref 70–99)
Potassium: 4.4 mmol/L (ref 3.5–5.1)
Sodium: 138 mmol/L (ref 135–145)
Total Bilirubin: 0.4 mg/dL (ref 0.3–1.2)
Total Protein: 6.6 g/dL (ref 6.5–8.1)

## 2021-11-27 LAB — SAVE SMEAR(SSMR), FOR PROVIDER SLIDE REVIEW

## 2021-11-27 LAB — CBC WITH DIFFERENTIAL (CANCER CENTER ONLY)
Abs Immature Granulocytes: 0.07 10*3/uL (ref 0.00–0.07)
Basophils Absolute: 0.1 10*3/uL (ref 0.0–0.1)
Basophils Relative: 1 %
Eosinophils Absolute: 0.3 10*3/uL (ref 0.0–0.5)
Eosinophils Relative: 5 %
HCT: 39.1 % (ref 36.0–46.0)
Hemoglobin: 12.5 g/dL (ref 12.0–15.0)
Immature Granulocytes: 1 %
Lymphocytes Relative: 23 %
Lymphs Abs: 1.5 10*3/uL (ref 0.7–4.0)
MCH: 27.1 pg (ref 26.0–34.0)
MCHC: 32 g/dL (ref 30.0–36.0)
MCV: 84.8 fL (ref 80.0–100.0)
Monocytes Absolute: 0.7 10*3/uL (ref 0.1–1.0)
Monocytes Relative: 10 %
Neutro Abs: 4 10*3/uL (ref 1.7–7.7)
Neutrophils Relative %: 60 %
Platelet Count: 321 10*3/uL (ref 150–400)
RBC: 4.61 MIL/uL (ref 3.87–5.11)
RDW: 14.4 % (ref 11.5–15.5)
WBC Count: 6.5 10*3/uL (ref 4.0–10.5)
nRBC: 0 % (ref 0.0–0.2)

## 2021-11-27 LAB — RETICULOCYTES
Immature Retic Fract: 8.5 % (ref 2.3–15.9)
RBC.: 4.6 MIL/uL (ref 3.87–5.11)
Retic Count, Absolute: 58.9 10*3/uL (ref 19.0–186.0)
Retic Ct Pct: 1.3 % (ref 0.4–3.1)

## 2021-11-27 LAB — FERRITIN: Ferritin: 25 ng/mL (ref 11–307)

## 2021-11-27 LAB — LACTATE DEHYDROGENASE: LDH: 147 U/L (ref 98–192)

## 2021-11-27 NOTE — Progress Notes (Unsigned)
Hematology/Oncology Consultation   Name: [name]      MRN: [id number]    Location: Room/bed info not found  Date: 11/27/2021 Time:3:33 PM   REFERRING PHYSICIAN: Beatrice Lecher, MD    REASON FOR CONSULT:  Iron deficiency anemia    DIAGNOSIS: Iron deficiency anemia   HISTORY OF PRESENT ILLNESS:  [name] is a very pleasant 76 yo caucasian female with history of iron deficiency anemia post surgery.  She had a total left hip replacement on 08/22/2021 and has recuperated nicely otherwise.  Iron saturation a few weeks ago was only 14% and ferritin 21.  Hgb today is 12.5, MCV 84, platelets 321 and WBC count 6.5.   She has tried taking an oral iron supplement but this causes GI upset and constipation as well as not giving any real benefit.  She has not noted any obvious blood loss. No abnormal bruising. No petechiae.  She had her most recent colonoscopy in June 2021. She had 2 benign polyps removed and was noted to have large internal hemorrhoids at that time.  She was borderline diabetic and has controlled with her diet and exercise.  She takes synthroid for hypothyroidism. She states that this has been well controlled.  She has remote history of atypical ductal hyperplasia of the left breast with lumpectomy on 10/12/2011.  She had a basal cell carcinoma removed from the right nostril.  No fever, chills, n/v, cough, rash, palpitations, abdominal pain or changes in bladder habits.  She has history of rheumatoid arthritis.  She does have positional tingling and numbness in the right hands that comes and goes. 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  No falls or syncope reported.  No smoking or recreational drug use.  She enjoys the occasional glass of wine.  Appetite and hydration are good.  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 She now enjoys spending time with her 7 sweet grandchildren!  ROS: All other 10 point review of systems is negative.    PAST MEDICAL HISTORY:   Past Medical History:  Diagnosis Date   Allergic rhinitis, cause unspecified    Anxiety    Asthma    Benign paroxysmal positional vertigo    Cancer (HCC)    basal cell carcinoma per right side of nostril   CHF (congestive heart failure) ([facility]) 11/09/2016   had 2+ edema in legs with anxiety and SOB   Complication of anesthesia    has 4  degenerative discs in neck.   Depressive disorder, not elsewhere classified    Esophageal reflux    History of hiatal hernia    Hypertension    Localized osteoarthrosis not specified whether primary or secondary, lower leg    Other and unspecified hyperlipidemia    Other diseases of vocal cords    Peptic ulcer, unspecified site, unspecified as acute or chronic, without mention of hemorrhage, perforation, or obstruction    Pneumonia    bilat pneumonia 1987   Pre-diabetes    no meds,just diet controll   TMJ (temporomandibular joint disorder)    Unspecified arthropathy, hand    Unspecified asthma(493.90)    triggered with a virus    Unspecified hypothyroidism     ALLERGIES: Allergies  Allergen Reactions   Astelin [Azelastine Hcl] Other (See Comments)    Headache   Celebrex [Celecoxib] Other (See Comments)    Stomach issues   Lisinopril Other (See Comments) and Cough    ACE cough   Methotrexate Derivatives  Other (See Comments)   Morphine Nausea Only   Moxifloxacin Other (See Comments)    REACTION: confusion, dizziness, paranoia      MEDICATIONS:  Current Outpatient Medications on File Prior to Visit  Medication Sig Dispense Refill   albuterol (VENTOLIN HFA) 108 (90 Base) MCG/ACT inhaler INHALE 2 PUFFS INTO THE LUNGS EVERY 6 (SIX) HOURS AS NEEDED FOR SHORTNESS OF BREATH. 8.5 each 5   Ascorbic Acid (VITAMIN C WITH ROSE HIPS) 1000 MG tablet Take 1,000 mg by mouth daily.     benzonatate (TESSALON) 200 MG capsule Take 1 capsule (200 mg total) by mouth 3 (three) times daily as needed for cough. 45 capsule 1    buPROPion (WELLBUTRIN XL) 300 MG 24 hr tablet Take 300 mg by mouth daily.     Calcium Carb-Cholecalciferol (CALCIUM 600+D3 PO) Take 1 tablet by mouth daily.     Carboxymethylcell-Hypromellose (GENTEAL OP) Place 1-2 drops into both eyes 3 (three) times daily as needed (for dry eyes).     Cholecalciferol (VITAMIN D3) 50 MCG (2000 UT) capsule Take 2,000 Units by mouth 3 (three) times a week.     clobetasol ointment (TEMOVATE) 6.57 % Apply 1 Application topically 2 (two) times daily as needed. 30 g 0   clonazePAM (KLONOPIN) 0.5 MG tablet Take 0.125 mg by mouth daily as needed for anxiety.     famotidine (PEPCID) 20 MG tablet Take 20 mg by mouth at bedtime.      fexofenadine (ALLEGRA) 180 MG tablet Take 180 mg by mouth at bedtime.     folic acid (FOLVITE) 1 MG tablet Take 1 mg by mouth 2 (two) times daily.     Ginkgo Biloba (GNP GINGKO BILOBA EXTRACT PO) Take 1 tablet by mouth 3 (three) times daily with meals.     Guaifenesin (MUCINEX MAXIMUM STRENGTH) 1200 MG TB12 Take 1,200 mg by mouth in the morning and at bedtime.     hydrochlorothiazide (HYDRODIURIL) 25 MG tablet TAKE 1 TABLET BY MOUTH EVERY DAY 90 tablet 1   Lido-Menthol-Methyl Sal-Camph (CBD KINGS EX) Apply 1 Application topically in the morning and at bedtime.     losartan (COZAAR) 50 MG tablet TAKE 1 TABLET BY MOUTH EVERY DAY 90 tablet 3   Magnesium 100 MG TABS Take 100 mg by mouth 2 (two) times a day.     meloxicam (MOBIC) 15 MG tablet TAKE 1 TABLET BY MOUTH EVERY DAY IN THE MORNING 90 tablet 0   Menthol, Topical Analgesic, (BIOFREEZE EX) Apply 1 Application topically daily as needed (pain).     methocarbamol (ROBAXIN) 500 MG tablet Take 1 tablet (500 mg total) by mouth every 6 (six) hours as needed for muscle spasms. 40 tablet 0   metroNIDAZOLE (METROGEL) 1 % gel Apply topically daily. (Patient taking differently: Apply 1 application  topically daily as needed (facial outbreaks).) 60 g 1   pantoprazole (PROTONIX) 40 MG tablet Take 1 tablet  (40 mg total) by mouth daily before breakfast. 90 tablet 3   PAXIL CR 12.5 MG 24 hr tablet Take 12.5 mg by mouth daily.     Polyethyl Glycol-Propyl Glycol (SYSTANE ULTRA PF OP) Place 1 drop into both eyes daily as needed (dry eyes).     polyethylene glycol (MIRALAX / GLYCOLAX) 17 g packet Take 17 g by mouth daily as needed for mild constipation. 14 each 0   SYMBICORT 80-4.5 MCG/ACT inhaler TAKE 2 PUFFS BY MOUTH TWICE A DAY 30.6 each 3   SYNTHROID 75 MCG tablet TAKE 1  TABLET IN AM ON EMPTY STOMACH 6 DAYS PER WEEK FOR THYROID (NAME BRAND ONLY DAW) (Patient taking differently: Take 75 mcg by mouth daily before breakfast.) 72 tablet 4   No current facility-administered medications on file prior to visit.     PAST SURGICAL HISTORY Past Surgical History:  Procedure Laterality Date   BREAST BIOPSY  09/07/2011   High Risk Lesion   BREAST EXCISIONAL BIOPSY Left    BREAST SURGERY     LUMPECTOMY / LEFT 10/12/2011   KNEE ARTHROSCOPY     bilat    NASAL SINUS SURGERY     ROTATOR CUFF REPAIR     right    thumb surgery      left hand 45 years ago    TOTAL HIP ARTHROPLASTY Right 08/10/2014   Procedure: RIGHT TOTAL HIP ARTHROPLASTY ANTERIOR APPROACH;  Surgeon: Paralee Cancel, MD;  Location: [facility];  Service: Orthopedics;  Laterality: Right;   TOTAL HIP ARTHROPLASTY Left 08/22/2021   Procedure: TOTAL HIP ARTHROPLASTY ANTERIOR APPROACH;  Surgeon: Paralee Cancel, MD;  Location: [facility];  Service: Orthopedics;  Laterality: Left;   TOTAL KNEE ARTHROPLASTY Left 08/26/2018   Procedure: TOTAL KNEE ARTHROPLASTY;  Surgeon: Paralee Cancel, MD;  Location: [facility];  Service: Orthopedics;  Laterality: Left;  70 mins    FAMILY HISTORY: Family History  Problem Relation Age of Onset   Heart disease Father    Colon cancer Mother    Liver cancer Brother    Asthma Brother     SOCIAL HISTORY:  reports that she quit smoking about 63 years ago. Her smoking use included cigarettes. She has a 0.60 pack-year smoking history.  She has never used smokeless tobacco. She reports current alcohol use of about 1.0 standard drink of alcohol per week. She reports that she does not use drugs.  PERFORMANCE STATUS: The patient's performance status is 1 - Symptomatic but completely ambulatory  PHYSICAL EXAM: Most Recent Vital Signs: There were no vitals taken for this visit. There were no vitals taken for this visit.  General Appearance:    Alert, cooperative, no distress, appears stated age  Head:    Normocephalic, without obvious abnormality, atraumatic  Eyes:    PERRL, conjunctiva/corneas clear, EOM's intact, fundi    benign, both eyes        Throat:   Lips, mucosa, and tongue normal; teeth and gums normal  Neck:   Supple, symmetrical, trachea midline, no adenopathy;    thyroid:  no enlargement/tenderness/nodules; no carotid   bruit or JVD  Back:     Symmetric, no curvature, ROM normal, no CVA tenderness  Lungs:     Clear to auscultation bilaterally, respirations unlabored  Chest Wall:    No tenderness or deformity   Heart:    Regular rate and rhythm, S1 and S2 normal, no murmur, rub   or gallop     Abdomen:     Soft, non-tender, bowel sounds active all four quadrants,    no masses, no organomegaly        Extremities:   Extremities normal, atraumatic, no cyanosis or edema  Pulses:   2+ and symmetric all extremities  Skin:   Skin color, texture, turgor normal, no rashes or lesions  Lymph nodes:   Cervical, supraclavicular, and axillary nodes normal  Neurologic:   CNII-XII intact, normal strength, sensation and reflexes    throughout    LABORATORY DATA:  Results for orders placed or performed in visit on 11/27/21 (from the past 48 hour(s))  CBC with Differential (Cancer Center Only)     Status: None   Collection Time: 11/27/21  3:07 PM  Result Value Ref Range   WBC Count 6.5 4.0 - 10.5 K/uL   RBC 4.61 3.87 - 5.11 MIL/uL   Hemoglobin 12.5 12.0 - 15.0 g/dL   HCT 39.1 36.0 - 46.0 %   MCV 84.8 80.0 - 100.0 fL    MCH 27.1 26.0 - 34.0 pg   MCHC 32.0 30.0 - 36.0 g/dL   RDW 14.4 11.5 - 15.5 %   Platelet Count 321 150 - 400 K/uL   nRBC 0.0 0.0 - 0.2 %   Neutrophils Relative % 60 %   Neutro Abs 4.0 1.7 - 7.7 K/uL   Lymphocytes Relative 23 %   Lymphs Abs 1.5 0.7 - 4.0 K/uL   Monocytes Relative 10 %   Monocytes Absolute 0.7 0.1 - 1.0 K/uL   Eosinophils Relative 5 %   Eosinophils Absolute 0.3 0.0 - 0.5 K/uL   Basophils Relative 1 %   Basophils Absolute 0.1 0.0 - 0.1 K/uL   Immature Granulocytes 1 %   Abs Immature Granulocytes 0.07 0.00 - 0.07 K/uL    Comment: Performed at [facility] at [facility], [address], [place], [place] [address]  Reticulocytes     Status: None   Collection Time: 11/27/21  3:07 PM  Result Value Ref Range   Retic Ct Pct 1.3 0.4 - 3.1 %   RBC. 4.60 3.87 - 5.11 MIL/uL   Retic Count, Absolute 58.9 19.0 - 186.0 K/uL   Immature Retic Fract 8.5 2.3 - 15.9 %    Comment: Performed at [facility] at [facility], [address] [place], [place] [address]  Save Smear for Provider Slide Review     Status: None   Collection Time: 11/27/21  3:07 PM  Result Value Ref Range   Smear Review SMEAR STAINED AND AVAILABLE FOR REVIEW     Comment: Performed at [facility] at [facility], [address] [place], [place] [address]      RADIOGRAPHY: No results found.     PATHOLOGY: None  ASSESSMENT/PLAN: [name] is a very pleasant 76 yo caucasian female with history of iron deficiency anemia post total left hip replacement surgery.  Iron saturation is down to 10% and ferritin 25.  We will get her set up for IV iron starting this week if possible.  Follow-up in 8 weeks.   All questions were answered. The patient knows to call the clinic with any problems, questions or concerns. We can certainly see the patient much sooner if necessary.  The patient was discussed with Dr. Marin Olp and he is in  agreement with the aforementioned.   Lottie Dawson, NP

## 2021-11-28 ENCOUNTER — Encounter: Payer: Self-pay | Admitting: Family

## 2021-11-28 LAB — IRON AND IRON BINDING CAPACITY (CC-WL,HP ONLY)
Iron: 38 ug/dL (ref 28–170)
Saturation Ratios: 10 % — ABNORMAL LOW (ref 10.4–31.8)
TIBC: 395 ug/dL (ref 250–450)
UIBC: 357 ug/dL (ref 148–442)

## 2021-11-28 LAB — ERYTHROPOIETIN: Erythropoietin: 10.3 m[IU]/mL (ref 2.6–18.5)

## 2021-12-01 ENCOUNTER — Telehealth: Payer: Self-pay | Admitting: *Deleted

## 2021-12-01 ENCOUNTER — Other Ambulatory Visit: Payer: Self-pay | Admitting: Pulmonary Disease

## 2021-12-01 NOTE — Telephone Encounter (Signed)
Message received from patient requesting lab results from Monday, 11/27/21.  Call placed back to patient and patient notified per order of S. Eulas Post NP that she will need one dose of IV iron.  Pt is appreciative of call and has no questions or concerns at this time.

## 2021-12-04 ENCOUNTER — Other Ambulatory Visit: Payer: Self-pay | Admitting: General Practice

## 2021-12-04 ENCOUNTER — Ambulatory Visit (HOSPITAL_COMMUNITY)
Admission: RE | Admit: 2021-12-04 | Discharge: 2021-12-04 | Disposition: A | Discharge status: Home / Self Care | Payer: Medicare Other | Source: Ambulatory Visit | Attending: General Practice | Admitting: General Practice

## 2021-12-04 DIAGNOSIS — I700 Atherosclerosis of aorta: Secondary | ICD-10-CM | POA: Diagnosis not present

## 2021-12-04 DIAGNOSIS — R42 Dizziness and giddiness: Secondary | ICD-10-CM

## 2021-12-04 DIAGNOSIS — E7800 Pure hypercholesterolemia, unspecified: Secondary | ICD-10-CM | POA: Insufficient documentation

## 2021-12-06 ENCOUNTER — Ambulatory Visit: Payer: PRIVATE HEALTH INSURANCE

## 2021-12-13 DIAGNOSIS — M25512 Pain in left shoulder: Secondary | ICD-10-CM | POA: Diagnosis not present

## 2021-12-14 ENCOUNTER — Inpatient Hospital Stay: Payer: Medicare Other | Attending: Hematology & Oncology

## 2021-12-14 VITALS — BP 131/69 | HR 70 | Temp 97.9°F | Resp 18

## 2021-12-14 DIAGNOSIS — D509 Iron deficiency anemia, unspecified: Secondary | ICD-10-CM | POA: Insufficient documentation

## 2021-12-14 DIAGNOSIS — E611 Iron deficiency: Secondary | ICD-10-CM

## 2021-12-14 MED ORDER — SODIUM CHLORIDE 0.9 % IV SOLN: Freq: Once | INTRAVENOUS | Status: AC

## 2021-12-14 MED ORDER — SODIUM CHLORIDE 0.9 % IV SOLN
1000.0000 mg | Freq: Once | INTRAVENOUS | Status: AC
  Administered 2021-12-14: 1000 mg via INTRAVENOUS
  Filled 2021-12-14: qty 10

## 2021-12-14 NOTE — Patient Instructions (Signed)

## 2021-12-19 DIAGNOSIS — F422 Mixed obsessional thoughts and acts: Secondary | ICD-10-CM | POA: Diagnosis not present

## 2021-12-19 DIAGNOSIS — F410 Panic disorder [episodic paroxysmal anxiety] without agoraphobia: Secondary | ICD-10-CM | POA: Diagnosis not present

## 2021-12-19 DIAGNOSIS — F902 Attention-deficit hyperactivity disorder, combined type: Secondary | ICD-10-CM | POA: Diagnosis not present

## 2021-12-25 ENCOUNTER — Other Ambulatory Visit: Payer: Self-pay | Admitting: Family Medicine

## 2022-01-10 ENCOUNTER — Encounter: Payer: Self-pay | Admitting: Family Medicine

## 2022-01-10 ENCOUNTER — Telehealth: Payer: Self-pay | Admitting: *Deleted

## 2022-01-10 ENCOUNTER — Ambulatory Visit (INDEPENDENT_AMBULATORY_CARE_PROVIDER_SITE_OTHER): Payer: Medicare Other | Admitting: Family Medicine

## 2022-01-10 VITALS — BP 131/57 | HR 76 | Temp 98.1°F

## 2022-01-10 DIAGNOSIS — R0981 Nasal congestion: Secondary | ICD-10-CM | POA: Diagnosis not present

## 2022-01-10 DIAGNOSIS — J019 Acute sinusitis, unspecified: Secondary | ICD-10-CM

## 2022-01-10 DIAGNOSIS — E038 Other specified hypothyroidism: Secondary | ICD-10-CM

## 2022-01-10 LAB — POC COVID19 BINAXNOW: SARS Coronavirus 2 Ag: NEGATIVE

## 2022-01-10 MED ORDER — AMOXICILLIN 875 MG PO TABS: 875.0000 mg | ORAL_TABLET | Freq: Two times a day (BID) | ORAL | 0 refills | Status: AC

## 2022-01-10 NOTE — Assessment & Plan Note (Signed)
Taking 75 mcg daily.  We will plan to recheck again in March.  If TSH is still less than 1 then we will try to decrease her medication.  Lab Results  Component Value Date   TSH 0.45 11/08/2021

## 2022-01-10 NOTE — Telephone Encounter (Signed)
Pt wants to know if her taking the Synthroid 75 mcg this whole time has caused her to have alopecia?  When her TSH was checked in February it was 0.32 and she was told to  go back up to 88 mcg 5 days a week and 75 mcg 2 days a week   When the TSH was rechecked in June it was 0.58 she was advised to continue with the current regimen and recheck in 6 months.   In September her TSH was 0.45 the thyroid was normal.   Pt stated that she had NEVER started taking the 88 mcg 5 days a week she has and continues to take 75 mcg.   Will fwd to pcp for advice.

## 2022-01-10 NOTE — Progress Notes (Signed)
Acute Office Visit  Subjective:     Patient ID: [name], female    DOB: [birth year], 76 y.o.   MRN: [id number]  Chief Complaint  Patient presents with   Sinusitis    HPI Patient is in today for his old congestion.  She says she traveled out of state and was staying in a hotel where she immediately started to feel some congestion and sneezing which started about 9 days ago.  Symptoms were fairly mild until about 3 days ago when she felt like she got worse and she felt like the mucus was turning more thick and yellow.  Now her ears are hurting she actually had to fly back into [place] last night and she thought her head was going to explode.  No fevers or chills.  No one else has been sick on the trip.  No sore throat.  No cough.   He also had some questions in regards to her thyroid medication as below:  Pt wants to know if her taking the Synthroid 75 mcg this whole time has caused her to have alopecia?   When her TSH was checked in February it was 0.32 and she was told to  go back up to 88 mcg 5 days a week and 75 mcg 2 days a week    When the TSH was rechecked in June it was 0.58 she was advised to continue with the current regimen and recheck in 6 months.    In September her TSH was 0.45 the thyroid was normal.    Pt stated that she had NEVER started taking the 88 mcg 5 days a week she has and continues to take 75 mcg.      ROS      Objective:    BP (!) 131/57   Pulse 76   Temp 98.1 F (36.7 C)   SpO2 97%    Physical Exam Constitutional:      Appearance: She is well-developed.  HENT:     Head: Normocephalic and atraumatic.     Right Ear: External ear normal.     Left Ear: External ear normal.     Nose: Nose normal.  Eyes:     Conjunctiva/sclera: Conjunctivae normal.     Pupils: Pupils are equal, round, and reactive to light.  Neck:     Thyroid: No thyromegaly.  Cardiovascular:     Rate and Rhythm: Normal rate and regular rhythm.     Heart  sounds: Normal heart sounds.  Pulmonary:     Effort: Pulmonary effort is normal.     Breath sounds: Normal breath sounds. No wheezing.  Musculoskeletal:     Cervical back: Neck supple.  Lymphadenopathy:     Cervical: No cervical adenopathy.  Skin:    General: Skin is warm and dry.  Neurological:     Mental Status: She is alert and oriented to person, place, and time.     Results for orders placed or performed in visit on 01/10/22  POC COVID-19  Result Value Ref Range   SARS Coronavirus 2 Ag Negative Negative        Assessment & Plan:   Problem List Items Addressed This Visit       Endocrine   Hypothyroidism    Taking 75 mcg daily.  We will plan to recheck again in March.  If TSH is still less than 1 then we will try to decrease her medication.  Lab Results  Component Value Date   TSH  0.45 11/08/2021        Other Visit Diagnoses     Nasal congestion    -  Primary   Relevant Orders   POC COVID-19 (Completed)   Acute non-recurrent sinusitis, unspecified location       Relevant Medications   amoxicillin (AMOXIL) 875 MG tablet       Acute sinusitis-I did go ahead and swab for COVID since she was traveling and has gotten worse in the last 3 days to make sure that she does not have COVID on top of some allergic symptoms.  COVID was negative.  We will go ahead and treat with amoxicillin.  Call if not better in 1 week.  Okay to continue symptomatic care.  Meds ordered this encounter  Medications   amoxicillin (AMOXIL) 875 MG tablet    Sig: Take 1 tablet (875 mg total) by mouth 2 (two) times daily for 10 days.    Dispense:  14 tablet    Refill:  0    Return if symptoms worsen or fail to improve.  Beatrice Lecher, MD

## 2022-01-10 NOTE — Progress Notes (Signed)
Pt reports that her sxs started 9 days ago but got worse the past 3 days. She has nasal congestion, ear pressure, sinus drainage. She has only taken Tylenol

## 2022-01-12 ENCOUNTER — Telehealth: Payer: Self-pay

## 2022-01-12 MED ORDER — OSELTAMIVIR PHOSPHATE 75 MG PO CAPS: 75.0000 mg | ORAL_CAPSULE | Freq: Two times a day (BID) | ORAL | 0 refills | Status: DC

## 2022-01-12 NOTE — Telephone Encounter (Signed)
I only put her on a 7 days course, as she had only had sxs for a week.   Meds ordered this encounter  Medications   oseltamivir (TAMIFLU) 75 MG capsule    Sig: Take 1 capsule (75 mg total) by mouth 2 (two) times daily. X 10 days    Dispense:  10 capsule    Refill:  0

## 2022-01-12 NOTE — Telephone Encounter (Signed)
[name] called and states she only received 14 tablets instead of 20 for a 10 day course of the Amoxicillin.    She also reports a temp of 102.6 with terrible body aches. She thinks she has the flu and would like medication for the flu sent to the pharmacy.

## 2022-01-15 NOTE — Telephone Encounter (Signed)
Patient advised.

## 2022-01-16 DIAGNOSIS — F422 Mixed obsessional thoughts and acts: Secondary | ICD-10-CM | POA: Diagnosis not present

## 2022-01-16 DIAGNOSIS — F902 Attention-deficit hyperactivity disorder, combined type: Secondary | ICD-10-CM | POA: Diagnosis not present

## 2022-01-16 DIAGNOSIS — F410 Panic disorder [episodic paroxysmal anxiety] without agoraphobia: Secondary | ICD-10-CM | POA: Diagnosis not present

## 2022-01-19 ENCOUNTER — Encounter: Payer: Self-pay | Admitting: Family Medicine

## 2022-01-20 DIAGNOSIS — I080 Rheumatic disorders of both mitral and aortic valves: Secondary | ICD-10-CM | POA: Diagnosis not present

## 2022-01-20 DIAGNOSIS — F419 Anxiety disorder, unspecified: Secondary | ICD-10-CM | POA: Diagnosis present

## 2022-01-20 DIAGNOSIS — R918 Other nonspecific abnormal finding of lung field: Secondary | ICD-10-CM | POA: Diagnosis not present

## 2022-01-20 DIAGNOSIS — Z515 Encounter for palliative care: Secondary | ICD-10-CM | POA: Diagnosis not present

## 2022-01-20 DIAGNOSIS — R079 Chest pain, unspecified: Secondary | ICD-10-CM | POA: Diagnosis not present

## 2022-01-20 DIAGNOSIS — F32A Depression, unspecified: Secondary | ICD-10-CM | POA: Diagnosis present

## 2022-01-20 DIAGNOSIS — Z6831 Body mass index (BMI) 31.0-31.9, adult: Secondary | ICD-10-CM | POA: Diagnosis not present

## 2022-01-20 DIAGNOSIS — R0789 Other chest pain: Secondary | ICD-10-CM | POA: Diagnosis not present

## 2022-01-20 DIAGNOSIS — Z950 Presence of cardiac pacemaker: Secondary | ICD-10-CM | POA: Diagnosis not present

## 2022-01-20 DIAGNOSIS — K219 Gastro-esophageal reflux disease without esophagitis: Secondary | ICD-10-CM | POA: Diagnosis not present

## 2022-01-20 DIAGNOSIS — J302 Other seasonal allergic rhinitis: Secondary | ICD-10-CM | POA: Diagnosis present

## 2022-01-20 DIAGNOSIS — I517 Cardiomegaly: Secondary | ICD-10-CM | POA: Diagnosis not present

## 2022-01-20 DIAGNOSIS — I495 Sick sinus syndrome: Secondary | ICD-10-CM | POA: Diagnosis not present

## 2022-01-20 DIAGNOSIS — J9811 Atelectasis: Secondary | ICD-10-CM | POA: Diagnosis not present

## 2022-01-20 DIAGNOSIS — I498 Other specified cardiac arrhythmias: Secondary | ICD-10-CM | POA: Diagnosis not present

## 2022-01-20 DIAGNOSIS — Z79899 Other long term (current) drug therapy: Secondary | ICD-10-CM | POA: Diagnosis not present

## 2022-01-20 DIAGNOSIS — J811 Chronic pulmonary edema: Secondary | ICD-10-CM | POA: Diagnosis not present

## 2022-01-20 DIAGNOSIS — M069 Rheumatoid arthritis, unspecified: Secondary | ICD-10-CM | POA: Diagnosis present

## 2022-01-20 DIAGNOSIS — Z66 Do not resuscitate: Secondary | ICD-10-CM | POA: Diagnosis present

## 2022-01-20 DIAGNOSIS — Z888 Allergy status to other drugs, medicaments and biological substances status: Secondary | ICD-10-CM | POA: Diagnosis not present

## 2022-01-20 DIAGNOSIS — Z87891 Personal history of nicotine dependence: Secondary | ICD-10-CM | POA: Diagnosis not present

## 2022-01-20 DIAGNOSIS — Z79890 Hormone replacement therapy (postmenopausal): Secondary | ICD-10-CM | POA: Diagnosis not present

## 2022-01-20 DIAGNOSIS — Z7189 Other specified counseling: Secondary | ICD-10-CM | POA: Diagnosis not present

## 2022-01-20 DIAGNOSIS — E669 Obesity, unspecified: Secondary | ICD-10-CM | POA: Diagnosis present

## 2022-01-20 DIAGNOSIS — E039 Hypothyroidism, unspecified: Secondary | ICD-10-CM | POA: Diagnosis present

## 2022-01-20 DIAGNOSIS — I471 Supraventricular tachycardia, unspecified: Secondary | ICD-10-CM | POA: Diagnosis not present

## 2022-01-20 DIAGNOSIS — I10 Essential (primary) hypertension: Secondary | ICD-10-CM | POA: Diagnosis present

## 2022-01-20 DIAGNOSIS — Z8709 Personal history of other diseases of the respiratory system: Secondary | ICD-10-CM | POA: Diagnosis not present

## 2022-01-20 DIAGNOSIS — Z9889 Other specified postprocedural states: Secondary | ICD-10-CM | POA: Diagnosis not present

## 2022-01-21 DIAGNOSIS — I471 Supraventricular tachycardia, unspecified: Secondary | ICD-10-CM | POA: Diagnosis present

## 2022-01-21 DIAGNOSIS — J302 Other seasonal allergic rhinitis: Secondary | ICD-10-CM | POA: Diagnosis present

## 2022-01-21 DIAGNOSIS — I498 Other specified cardiac arrhythmias: Secondary | ICD-10-CM | POA: Diagnosis not present

## 2022-01-21 DIAGNOSIS — R079 Chest pain, unspecified: Secondary | ICD-10-CM | POA: Diagnosis not present

## 2022-01-21 DIAGNOSIS — E039 Hypothyroidism, unspecified: Secondary | ICD-10-CM | POA: Diagnosis present

## 2022-01-21 DIAGNOSIS — I10 Essential (primary) hypertension: Secondary | ICD-10-CM | POA: Diagnosis present

## 2022-01-21 DIAGNOSIS — Z888 Allergy status to other drugs, medicaments and biological substances status: Secondary | ICD-10-CM | POA: Diagnosis not present

## 2022-01-21 DIAGNOSIS — F32A Depression, unspecified: Secondary | ICD-10-CM | POA: Diagnosis not present

## 2022-01-21 DIAGNOSIS — E669 Obesity, unspecified: Secondary | ICD-10-CM | POA: Diagnosis present

## 2022-01-21 DIAGNOSIS — M069 Rheumatoid arthritis, unspecified: Secondary | ICD-10-CM | POA: Diagnosis present

## 2022-01-21 DIAGNOSIS — I080 Rheumatic disorders of both mitral and aortic valves: Secondary | ICD-10-CM | POA: Diagnosis not present

## 2022-01-21 DIAGNOSIS — Z950 Presence of cardiac pacemaker: Secondary | ICD-10-CM | POA: Diagnosis not present

## 2022-01-21 DIAGNOSIS — J811 Chronic pulmonary edema: Secondary | ICD-10-CM | POA: Diagnosis not present

## 2022-01-21 DIAGNOSIS — Z515 Encounter for palliative care: Secondary | ICD-10-CM | POA: Diagnosis not present

## 2022-01-21 DIAGNOSIS — R918 Other nonspecific abnormal finding of lung field: Secondary | ICD-10-CM | POA: Diagnosis not present

## 2022-01-21 DIAGNOSIS — Z7189 Other specified counseling: Secondary | ICD-10-CM | POA: Diagnosis not present

## 2022-01-21 DIAGNOSIS — Z8709 Personal history of other diseases of the respiratory system: Secondary | ICD-10-CM | POA: Diagnosis not present

## 2022-01-21 DIAGNOSIS — Z87891 Personal history of nicotine dependence: Secondary | ICD-10-CM | POA: Diagnosis not present

## 2022-01-21 DIAGNOSIS — F419 Anxiety disorder, unspecified: Secondary | ICD-10-CM | POA: Diagnosis not present

## 2022-01-21 DIAGNOSIS — Z6831 Body mass index (BMI) 31.0-31.9, adult: Secondary | ICD-10-CM | POA: Diagnosis not present

## 2022-01-21 DIAGNOSIS — Z66 Do not resuscitate: Secondary | ICD-10-CM | POA: Diagnosis present

## 2022-01-21 DIAGNOSIS — Z79890 Hormone replacement therapy (postmenopausal): Secondary | ICD-10-CM | POA: Diagnosis not present

## 2022-01-21 DIAGNOSIS — Z9889 Other specified postprocedural states: Secondary | ICD-10-CM | POA: Diagnosis not present

## 2022-01-21 DIAGNOSIS — Z79899 Other long term (current) drug therapy: Secondary | ICD-10-CM | POA: Diagnosis not present

## 2022-01-21 DIAGNOSIS — I517 Cardiomegaly: Secondary | ICD-10-CM | POA: Diagnosis not present

## 2022-01-21 DIAGNOSIS — K219 Gastro-esophageal reflux disease without esophagitis: Secondary | ICD-10-CM | POA: Diagnosis present

## 2022-01-21 DIAGNOSIS — J9811 Atelectasis: Secondary | ICD-10-CM | POA: Diagnosis not present

## 2022-01-21 DIAGNOSIS — I495 Sick sinus syndrome: Secondary | ICD-10-CM | POA: Diagnosis not present

## 2022-01-22 ENCOUNTER — Inpatient Hospital Stay: Payer: Medicare Other

## 2022-01-22 ENCOUNTER — Inpatient Hospital Stay: Payer: Medicare Other | Admitting: Family

## 2022-01-22 MED ORDER — AZITHROMYCIN 250 MG PO TABS: ORAL_TABLET | ORAL | 0 refills | Status: AC

## 2022-01-22 NOTE — Telephone Encounter (Signed)
Meds ordered this encounter  ?Medications  ? azithromycin (ZITHROMAX) 250 MG tablet  ?  Sig: 2 Ttabs PO on Day 1, then one a day x 4 days.  ?  Dispense:  6 tablet  ?  Refill:  0  ? ? ?

## 2022-01-24 ENCOUNTER — Other Ambulatory Visit: Payer: Self-pay | Admitting: Family

## 2022-01-25 ENCOUNTER — Encounter: Payer: Self-pay | Admitting: *Deleted

## 2022-01-25 ENCOUNTER — Telehealth: Payer: Self-pay | Admitting: *Deleted

## 2022-01-25 NOTE — Patient Outreach (Signed)
  Care Coordination TOC Note Transition Care Management Unsuccessful Follow-up Telephone Call  Date of discharge and from where:  Wednesday, 01/24/22 [facility]; SVT with subsequent junctional heart rhythm  Attempts:  1st Attempt  Reason for unsuccessful TCM follow-up call:  Left voice message  Oneta Rack, RN, BSN, CCRN Alumnus RN CM Care Coordination/ Transition of [place] Management [phone]: direct office

## 2022-01-26 ENCOUNTER — Encounter: Payer: Self-pay | Admitting: *Deleted

## 2022-01-26 ENCOUNTER — Telehealth: Payer: Self-pay | Admitting: *Deleted

## 2022-01-26 NOTE — Patient Outreach (Signed)
  Care Coordination TOC Note Transition Care Management Follow-up Telephone Call Date of discharge and from where: Wednesday, 01/24/22 [facility]; SVT with subsequent junctional rhythm/ permanent pacemaker insertion How have you been since you were released from the hospital? "I am doing overall better, but I am going to call Dr. Suzi Roots when we are done talking-- I am starting to have an occasional cough and I want her to see me as soon as she can, I don't want to have any more problems if I can help it.  My family is taking great care of me and I am taking my time in recuperating" Any questions or concerns? Yes- reports has developed occasional/ intermittent, self-limiting cough post-hospital discharge: verbalizes pro-active plans to contact Dr. Suzi Roots about this after our phone call today and this was encouraged-- patient declines need for assistance in placing call to PCP  Items Reviewed: Did the pt receive and understand the discharge instructions provided? Yes  Medications obtained and verified? Yes  Other? No  Any new allergies since your discharge? No  Dietary orders reviewed? Yes Do you have support at home? Yes  family assisting as indicated; patient reports she is essentially independent in self-care  Home Care and Equipment/Supplies: Were home health services ordered? no If so, what is the name of the agency? N/A  Has the agency set up a time to come to the patient's home? not applicable Were any new equipment or medical supplies ordered?  No What is the name of the medical supply agency? N/A Were you able to get the supplies/equipment? not applicable Do you have any questions related to the use of the equipment or supplies? No N/A  Functional Questionnaire: (I = Independent and D = Dependent) ADLs: I  family assisting as indicated  Bathing/Dressing- I  family assisting as indicated  Meal Prep- I  family assisting as indicated  Eating- I  Maintaining continence-  I  Transferring/Ambulation- I  Managing Meds- I  family assisting as indicated  Follow up appointments reviewed:  PCP Hospital f/u appt confirmed? No  Scheduled to see - on - @ - patient declines need for assistance in scheduling appointment, states she prefers to schedule on her own [facility] f/u appt confirmed? Yes  Scheduled to see cardiology provider, post-recent PPM insertion on Thursday 02/01/22 @ "afternoon" Are transportation arrangements needed? No  If their condition worsens, is the pt aware to call PCP or go to the Emergency Dept.? Yes Was the patient provided with contact information for the PCP's office or ED? No- declined; reports already has contact information for all care providers Was to pt encouraged to call back with questions or concerns? Yes- confirmed patient has my direct phone number should needs arise in the future  SDOH assessments and interventions completed:   Yes SDOH Interventions Today    Flowsheet Row Most Recent Value  SDOH Interventions   Food Insecurity Interventions Intervention Not Indicated  Transportation Interventions Intervention Not Indicated  [husband providing transportation after recent hospitalization]      Care Coordination Interventions:  No Care Coordination interventions needed at this time.   Encounter Outcome:  Pt. Visit Completed    Oneta Rack, RN, BSN, CCRN Alumnus RN CM Care Coordination/ Transition of [place] Management [phone]: direct office

## 2022-01-29 ENCOUNTER — Ambulatory Visit (INDEPENDENT_AMBULATORY_CARE_PROVIDER_SITE_OTHER): Payer: Medicare Other | Admitting: Family Medicine

## 2022-01-29 ENCOUNTER — Encounter: Payer: Self-pay | Admitting: Family Medicine

## 2022-01-29 VITALS — BP 137/84 | HR 62 | Ht 61.0 in | Wt 174.0 lb

## 2022-01-29 DIAGNOSIS — Z23 Encounter for immunization: Secondary | ICD-10-CM | POA: Diagnosis not present

## 2022-01-29 DIAGNOSIS — I10 Essential (primary) hypertension: Secondary | ICD-10-CM | POA: Diagnosis not present

## 2022-01-29 DIAGNOSIS — Z950 Presence of cardiac pacemaker: Secondary | ICD-10-CM | POA: Diagnosis not present

## 2022-01-29 DIAGNOSIS — R052 Subacute cough: Secondary | ICD-10-CM | POA: Diagnosis not present

## 2022-01-29 NOTE — Progress Notes (Signed)
Established Patient Office Visit  Subjective   Patient ID: [name], female    DOB: 1945-02-28  Age: 76 y.o. MRN: [id number]  Chief Complaint  Patient presents with   Follow-up    HPI  Here for hospital follow-up today.  She presented initially with shortness of breath, heart racing and chest pain. She has been sick and ws taking Tamiflu.   She was found to be in SVT.  He was started on a diltiazem drip.  Cardiology was consulted and she underwent cardiac catheterization because of elevated troponin levels.   Hospital: [facility] date: 01/20/2022 Discharge date: 01/24/2022 Hospital Days: 3 days  Active Hospital Problems Diagnosis Date Noted POA *SVT (supraventricular tachycardia) 01/21/2022 Yes Junctional rhythm 01/21/2022 Yes Essential hypertension 01/21/2022 Yes Anxiety and depression 01/21/2022 Yes Hypothyroidism 01/21/2022 Yes History of asthma 44/31/5400 Not Applicable Seasonal allergies 01/21/2022 Yes GERD (gastroesophageal reflux disease) 01/21/2022 Yes  Still feeling well and trying to get adjusted.  Has cards f/u on Friday.  Has been urinating a lot since she has gotten home but she is also been drinking a lot more fluid she denies any swelling.  Still has just a little residual cough from when she was sick previously with occasional sputum she does take Mucinex she would like me to listen to her chest today.  She never actually took the azithromycin that was prescribed.  She would like to get her flu vaccine.  Hospital notes reviewed.    ROS    Objective:     BP 137/84   Pulse 62   Ht '5\' 1"'$  (1.549 m)   Wt 174 lb (78.9 kg)   SpO2 99%   BMI 32.88 kg/m    Physical Exam Vitals and nursing note reviewed.  Constitutional:      Appearance: She is well-developed.  HENT:     Head: Normocephalic and atraumatic.     Right Ear: External ear normal.     Left Ear: External ear normal.     Nose: Nose normal.  Eyes:     Conjunctiva/sclera:  Conjunctivae normal.     Pupils: Pupils are equal, round, and reactive to light.  Neck:     Thyroid: No thyromegaly.  Cardiovascular:     Rate and Rhythm: Normal rate and regular rhythm.     Heart sounds: Normal heart sounds.  Pulmonary:     Effort: Pulmonary effort is normal.     Breath sounds: Normal breath sounds. No wheezing.  Musculoskeletal:     Cervical back: Neck supple.  Lymphadenopathy:     Cervical: No cervical adenopathy.  Skin:    General: Skin is warm and dry.  Neurological:     Mental Status: She is alert and oriented to person, place, and time.  Psychiatric:        Behavior: Behavior normal.      No results found for any visits on 01/29/22.    The 10-year ASCVD risk score (Arnett DK, et al., 2019) is: 52.3%    Assessment & Plan:   Problem List Items Addressed This Visit       Cardiovascular and Mediastinum   Essential hypertension, benign    Pressure is at goal today.        Other   S/P placement of cardiac pacemaker - Primary    As she is doing really well incision looks great.  There is still a fair moderate bruising but again the incision is clean dry and intact and still has a  lot of the glue on the surface.  Follow-up with the cardiologist nurse on Thursday to check it.  Dr. Elonda Husky put the pacemaker in.      Other Visit Diagnoses     Subacute cough       Need for influenza vaccination       Relevant Orders   Flu Vaccine QUAD High Dose(Fluad) (Completed)      Cough-chest sounds clear is reassuring on exam it does sound like overall she is much better.  Do suspect postinfectious cough.  Return in about 4 months (around 05/31/2022) for Hypertension.    Beatrice Lecher, MD

## 2022-01-29 NOTE — Assessment & Plan Note (Signed)
Pressure is at goal today.

## 2022-01-29 NOTE — Assessment & Plan Note (Signed)
As she is doing really well incision looks great.  There is still a fair moderate bruising but again the incision is clean dry and intact and still has a lot of the glue on the surface.  Follow-up with the cardiologist nurse on Thursday to check it.  Dr. Elonda Husky put the pacemaker in.

## 2022-01-30 ENCOUNTER — Ambulatory Visit (INDEPENDENT_AMBULATORY_CARE_PROVIDER_SITE_OTHER): Payer: Medicare Other | Admitting: Family Medicine

## 2022-01-30 DIAGNOSIS — Z0000 Encounter for general adult medical examination without abnormal findings: Secondary | ICD-10-CM

## 2022-01-30 NOTE — Patient Instructions (Signed)
[facility] Maintenance Summary and Written Plan of Care  [name] ,  Thank you for allowing me to perform your Medicare Annual Wellness Visit and for your ongoing commitment to your health.   Health Maintenance & Immunization History Health Maintenance  Topic Date Due   COVID-19 Vaccine (3 - Pfizer risk series) 01/27/2023 (Originally 05/13/2019)   Zoster Vaccines- Shingrix (1 of 2) 02/10/2023 (Originally 11/18/1964)   DTaP/Tdap/Td (4 - Td or Tdap) 09/24/2022   Medicare Annual Wellness (AWV)  01/31/2023   COLONOSCOPY (Pts 45-66yr Insurance coverage will need to be confirmed)  08/05/2024   Pneumonia Vaccine 76 Years old  Completed   INFLUENZA VACCINE  Completed   DEXA SCAN  Completed   Hepatitis C Screening  Completed   HPV VACCINES  Aged Out   Immunization History  Administered Date(s) Administered   Fluad Quad(high Dose 65+) 12/24/2018, 12/28/2019, 12/14/2020, 01/29/2022   Influenza Split 11/07/2011, 01/12/2013   Influenza Whole 02/12/2005, 12/02/2007, 11/24/2008   Influenza, High Dose Seasonal PF 11/19/2016, 12/10/2017   Influenza,inj,Quad PF,6+ Mos 01/11/2015   Influenza-Unspecified 11/07/2011, 01/12/2013, 11/12/2013, 01/11/2015, 12/24/2018   PFIZER(Purple Top)SARS-COV-2 Vaccination 03/20/2019, 04/15/2019   Pneumococcal Conjugate-13 04/26/2014   Pneumococcal Polysaccharide-23 02/12/2001, 11/27/2001, 11/28/2006, 11/07/2011   Td 02/12/1998, 06/13/1998   Tdap 09/23/2012    These are the patient goals that we discussed:  Goals Addressed               This Visit's Progress     Patient Stated (pt-stated)        Patient would like to get stronger and build her strength.         This is a list of Health Maintenance Items that are overdue or due now: Shingles vaccine    Orders/Referrals Placed Today: No orders of the defined types were placed in this encounter.  (Contact our referral department at [phone] you have not spoken  with someone about your referral appointment within the next 5 days)    Follow-up Plan Follow-up with MHali Marry MD as planned Schedule shingles vaccine at the pharmacy. Medicare wellness visit in one year. Patient will access AVS on my chart.     Health Maintenance, Female Adopting a healthy lifestyle and getting preventive care are important in promoting health and wellness. Ask your health care provider about: The right schedule for you to have regular tests and exams. Things you can do on your own to prevent diseases and keep yourself healthy. What should I know about diet, weight, and exercise? Eat a healthy diet  Eat a diet that includes plenty of vegetables, fruits, low-fat dairy products, and lean protein. Do not eat a lot of foods that are high in solid fats, added sugars, or sodium. Maintain a healthy weight Body mass index (BMI) is used to identify weight problems. It estimates body fat based on height and weight. Your health care provider can help determine your BMI and help you achieve or maintain a healthy weight. Get regular exercise Get regular exercise. This is one of the most important things you can do for your health. Most adults should: Exercise for at least 150 minutes each week. The exercise should increase your heart rate and make you sweat (moderate-intensity exercise). Do strengthening exercises at least twice a week. This is in addition to the moderate-intensity exercise. Spend less time sitting. Even light physical activity can be beneficial. Watch cholesterol and blood lipids Have your blood tested for lipids and cholesterol at 78  years of age, then have this test every 5 years. Have your cholesterol levels checked more often if: Your lipid or cholesterol levels are high. You are older than 78 years of age. You are at high risk for heart disease. What should I know about cancer screening? Depending on your health history and family history,  you may need to have cancer screening at various ages. This may include screening for: Breast cancer. Cervical cancer. Colorectal cancer. Skin cancer. Lung cancer. What should I know about heart disease, diabetes, and high blood pressure? Blood pressure and heart disease High blood pressure causes heart disease and increases the risk of stroke. This is more likely to develop in people who have high blood pressure readings or are overweight. Have your blood pressure checked: Every 3-5 years if you are 52-62 years of age. Every year if you are 32 years old or older. Diabetes Have regular diabetes screenings. This checks your fasting blood sugar level. Have the screening done: Once every three years after age 56 if you are at a normal weight and have a low risk for diabetes. More often and at a younger age if you are overweight or have a high risk for diabetes. What should I know about preventing infection? Hepatitis B If you have a higher risk for hepatitis B, you should be screened for this virus. Talk with your health care provider to find out if you are at risk for hepatitis B infection. Hepatitis C Testing is recommended for: Everyone born from [birth year] through 1967. Anyone with known risk factors for hepatitis C. Sexually transmitted infections (STIs) Get screened for STIs, including gonorrhea and chlamydia, if: You are sexually active and are younger than 78 years of age. You are older than 78 years of age and your health care provider tells you that you are at risk for this type of infection. Your sexual activity has changed since you were last screened, and you are at increased risk for chlamydia or gonorrhea. Ask your health care provider if you are at risk. Ask your health care provider about whether you are at high risk for HIV. Your health care provider may recommend a prescription medicine to help prevent HIV infection. If you choose to take medicine to prevent HIV, you should  first get tested for HIV. You should then be tested every 3 months for as long as you are taking the medicine. Pregnancy If you are about to stop having your period (premenopausal) and you may become pregnant, seek counseling before you get pregnant. Take 400 to 800 micrograms (mcg) of folic acid every day if you become pregnant. Ask for birth control (contraception) if you want to prevent pregnancy. Osteoporosis and menopause Osteoporosis is a disease in which the bones lose minerals and strength with aging. This can result in bone fractures. If you are 80 years old or older, or if you are at risk for osteoporosis and fractures, ask your health care provider if you should: Be screened for bone loss. Take a calcium or vitamin D supplement to lower your risk of fractures. Be given hormone replacement therapy (HRT) to treat symptoms of menopause. Follow these instructions at home: Alcohol use Do not drink alcohol if: Your health care provider tells you not to drink. You are pregnant, may be pregnant, or are planning to become pregnant. If you drink alcohol: Limit how much you have to: 0-1 drink a day. Know how much alcohol is in your drink. In the [place] one drink equals  one 12 oz bottle of beer (355 mL), one 5 oz glass of wine (148 mL), or one 1 oz glass of hard liquor (44 mL). Lifestyle Do not use any products that contain nicotine or tobacco. These products include cigarettes, chewing tobacco, and vaping devices, such as e-cigarettes. If you need help quitting, ask your health care provider. Do not use street drugs. Do not share needles. Ask your health care provider for help if you need support or information about quitting drugs. General instructions Schedule regular health, dental, and eye exams. Stay current with your vaccines. Tell your health care provider if: You often feel depressed. You have ever been abused or do not feel safe at home. Summary Adopting a healthy lifestyle  and getting preventive care are important in promoting health and wellness. Follow your health care provider's instructions about healthy diet, exercising, and getting tested or screened for diseases. Follow your health care provider's instructions on monitoring your cholesterol and blood pressure. This information is not intended to replace advice given to you by your health care provider. Make sure you discuss any questions you have with your health care provider. Document Revised: 06/20/2020 Document Reviewed: 06/20/2020 Elsevier Patient Education  [place].

## 2022-01-30 NOTE — Progress Notes (Signed)
MEDICARE ANNUAL WELLNESS VISIT  01/30/2022  Telephone Visit Disclaimer This Medicare AWV was conducted by telephone due to national recommendations for restrictions regarding the COVID-19 Pandemic (e.g. social distancing).  I verified, using two identifiers, that I am speaking with [name] or their authorized healthcare agent. I discussed the limitations, risks, security, and privacy concerns of performing an evaluation and management service by telephone and the potential availability of an in-person appointment in the future. The patient expressed understanding and agreed to proceed.  Location of Patient: Home Location of Provider (nurse):  In the office.  Subjective:    [name] is a 76 y.o. female patient of Metheney, Rene Kocher, MD who had a Medicare Annual Wellness Visit today via telephone. [name] is Retired and lives with their spouse. she has 3 children. she reports that she is socially active and does interact with friends/family regularly. she is minimally physically active and enjoys crocheting, spending time with grandchildren and reading.  Patient Care Team: Hali Marry, MD as PCP - General (Family Medicine) Stanford Breed Denice Bors, MD as PCP - Cardiology (Cardiology) Elsie Stain, MD as Attending Physician (Pulmonary Disease) Altheimer, Legrand Como, MD as Attending Physician (Endocrinology) Shon Hough, MD as Referring Physician (Ophthalmology) Lelon Perla, MD as Consulting Physician (Cardiology)     01/30/2022    1:30 PM 11/27/2021    3:48 PM 08/22/2021   11:22 AM 08/10/2021    2:42 PM 11/25/2020    2:03 PM 04/21/2019    2:23 PM 08/26/2018    7:59 AM  Advanced Directives  Does Patient Have a Medical Advance Directive? Yes Yes Yes Yes Yes Yes Yes  Type of Advance Directive Living will [place];Living will [place];Living will  [place];Living will [place];Living will [place];Living will  Does patient want to make changes to medical advance directive? No - Patient declined No - Patient declined No - Patient declined No - Patient declined No - Patient declined No - Patient declined No - Patient declined  Copy of [place] in Chart?  No - copy requested No - copy requested  No - copy requested No - copy requested No - copy requested    Hospital Utilization Over the Past 12 Months: # of hospitalizations or ER visits: 3 # of surgeries: 1  Review of Systems    Patient reports that her overall health is unchanged compared to last year.  History obtained from chart review and the patient  Patient Reported Readings (BP, Pulse, CBG, Weight, etc) none  Pain Assessment Pain : No/denies pain     Current Medications & Allergies (verified) Allergies as of 01/30/2022       Reactions   Moxifloxacin Other (See Comments)   REACTION: confusion, dizziness, paranoia   Astelin [azelastine Hcl] Other (See Comments)   Headache   Celebrex [celecoxib] Other (See Comments)   Stomach issues   Lisinopril Other (See Comments), Cough   Methotrexate Derivatives Other (See Comments)   Morphine Nausea Only        Medication List        Accurate as of January 30, 2022  1:45 PM. If you have any questions, ask your nurse or doctor.          albuterol 108 (90 Base) MCG/ACT inhaler Commonly known as: VENTOLIN HFA INHALE 2 PUFFS INTO THE LUNGS EVERY 6 HOURS AS NEEDED FOR SHORTNESS OF BREATH   buPROPion  300 MG 24 hr tablet Commonly known as: WELLBUTRIN XL Take 300 mg by mouth daily.   CALCIUM 600+D3 PO Take 1 tablet by mouth daily.   CBD KINGS EX Apply 1 Application topically in the morning and at bedtime.   clonazePAM 0.5 MG tablet Commonly known as: KLONOPIN Take 0.125 mg by mouth daily as needed for anxiety.   famotidine 20 MG tablet Commonly known as: PEPCID Take 20 mg by mouth at  bedtime.   fexofenadine 180 MG tablet Commonly known as: ALLEGRA Take 180 mg by mouth at bedtime.   folic acid 1 MG tablet Commonly known as: FOLVITE Take 1 mg by mouth 2 (two) times daily.   GENTEAL OP Place 1-2 drops into both eyes 3 (three) times daily as needed (for dry eyes).   GNP GINGKO BILOBA EXTRACT PO Take 1 tablet by mouth 3 (three) times daily with meals.   hydrochlorothiazide 25 MG tablet Commonly known as: HYDRODIURIL TAKE 1 TABLET BY MOUTH EVERY DAY   losartan 50 MG tablet Commonly known as: COZAAR TAKE 1 TABLET BY MOUTH EVERY DAY   Magnesium 100 MG Tabs Take 100 mg by mouth 2 (two) times a day.   meloxicam 15 MG tablet Commonly known as: MOBIC TAKE 1 TABLET BY MOUTH EVERY DAY IN THE MORNING   metroNIDAZOLE 1 % gel Commonly known as: Metrogel Apply topically daily. What changed:  how much to take when to take this reasons to take this   Paxil CR 12.5 MG 24 hr tablet Generic drug: PARoxetine Take 12.5 mg by mouth daily.   polyethylene glycol 17 g packet Commonly known as: MIRALAX / GLYCOLAX Take 17 g by mouth daily as needed for mild constipation.   Symbicort 80-4.5 MCG/ACT inhaler Generic drug: budesonide-formoterol TAKE 2 PUFFS BY MOUTH TWICE A DAY   Synthroid 75 MCG tablet Generic drug: levothyroxine TAKE 1 TABLET IN AM ON EMPTY STOMACH 6 DAYS PER WEEK FOR THYROID (NAME BRAND ONLY DAW) What changed: See the new instructions.   SYSTANE ULTRA PF OP Place 1 drop into both eyes daily as needed (dry eyes).   vitamin C with rose hips 1000 MG tablet Take 1,000 mg by mouth daily.   Vitamin D3 50 MCG (2000 UT) capsule Take 2,000 Units by mouth 3 (three) times a week.        History (reviewed): Past Medical History:  Diagnosis Date   Allergic rhinitis, cause unspecified    Anxiety    Asthma    Benign paroxysmal positional vertigo    Cancer (HCC)    basal cell carcinoma per right side of nostril   CHF (congestive heart failure) ([place])  11/09/2016   had 2+ edema in legs with anxiety and SOB   Complication of anesthesia    has 4  degenerative discs in neck.   Depressive disorder, not elsewhere classified    Esophageal reflux    History of hiatal hernia    Hypertension    Localized osteoarthrosis not specified whether primary or secondary, lower leg    Other and unspecified hyperlipidemia    Other diseases of vocal cords    Peptic ulcer, unspecified site, unspecified as acute or chronic, without mention of hemorrhage, perforation, or obstruction    Pneumonia    bilat pneumonia 1987   Pre-diabetes    no meds,just diet controll   TMJ (temporomandibular joint disorder)    Unspecified arthropathy, hand    Unspecified asthma(493.90)    triggered with a virus  Unspecified hypothyroidism    Past Surgical History:  Procedure Laterality Date   BREAST BIOPSY  09/07/2011   High Risk Lesion   BREAST EXCISIONAL BIOPSY Left    BREAST SURGERY     LUMPECTOMY / LEFT 10/12/2011   KNEE ARTHROSCOPY     bilat    NASAL SINUS SURGERY     ROTATOR CUFF REPAIR     right    thumb surgery      left hand 45 years ago    TOTAL HIP ARTHROPLASTY Right 08/10/2014   Procedure: RIGHT TOTAL HIP ARTHROPLASTY ANTERIOR APPROACH;  Surgeon: Paralee Cancel, MD;  Location: [facility];  Service: Orthopedics;  Laterality: Right;   TOTAL HIP ARTHROPLASTY Left 08/22/2021   Procedure: TOTAL HIP ARTHROPLASTY ANTERIOR APPROACH;  Surgeon: Paralee Cancel, MD;  Location: [facility];  Service: Orthopedics;  Laterality: Left;   TOTAL KNEE ARTHROPLASTY Left 08/26/2018   Procedure: TOTAL KNEE ARTHROPLASTY;  Surgeon: Paralee Cancel, MD;  Location: [facility];  Service: Orthopedics;  Laterality: Left;  70 mins   Family History  Problem Relation Age of Onset   Heart disease Father    Colon cancer Mother    Liver cancer Brother    Asthma Brother    Social History   Socioeconomic History   Marital status: Married    Spouse name: [name]   Number of children: 3   Years of  education: 16   Highest education level: Bachelor's degree (e.g., BA, AB, BS)  Occupational History   Occupation: Retired    Comment: Pharmacist, hospital  Tobacco Use   Smoking status: Former    Packs/day: 0.30    Years: 2.00    Total pack years: 0.60    Types: Cigarettes    Quit date: 02/12/1958    Years since quitting: 64.0   Smokeless tobacco: Never  Vaping Use   Vaping Use: Never used  Substance and Sexual Activity   Alcohol use: Yes    Alcohol/week: 1.0 standard drink of alcohol    Types: 1 Glasses of wine per week    Comment: wine socially   Drug use: No   Sexual activity: Yes  Other Topics Concern   Not on file  Social History Narrative   Lives with her husband. She has three children. She enjoys spending time with her grandchildren and talking to her friends. When she does have some free time, she enjoys crocheting.    Social Determinants of Health   Financial Resource Strain: Low Risk  (01/30/2022)   Overall Financial Resource Strain (CARDIA)    Difficulty of Paying Living Expenses: Not hard at all  Food Insecurity: No Food Insecurity (01/30/2022)   Hunger Vital Sign    Worried About Running Out of Food in the Last Year: Never true    Ran Out of Food in the Last Year: Never true  Transportation Needs: No Transportation Needs (01/30/2022)   PRAPARE - Hydrologist (Medical): No    Lack of Transportation (Non-Medical): No  Physical Activity: Inactive (01/30/2022)   Exercise Vital Sign    Days of Exercise per Week: 0 days    Minutes of Exercise per Session: 0 min  Stress: No Stress Concern Present (01/30/2022)   [place]    Feeling of Stress : Not at all  Social Connections: [place] (01/30/2022)   Social Connection and Isolation Panel [NHANES]    Frequency of Communication with Friends and Family: More than three  times a week    Frequency of Social Gatherings with  Friends and Family: More than three times a week    Attends Religious Services: More than 4 times per year    Active Member of [employer] or Organizations: Yes    Attends Archivist Meetings: Never    Marital Status: Married    Activities of Daily Living    01/30/2022    1:36 PM 08/22/2021   11:22 AM  In your present state of health, do you have any difficulty performing the following activities:  Hearing? 0 0  Vision? 0 0  Difficulty concentrating or making decisions? 1 0  Comment sometimes instant recall is an issue   Walking or climbing stairs? 1 0  Comment her husband is help her temporarily until released by cardiologist   Dressing or bathing? 1 0  Comment her husband is help her temporarily   Doing errands, shopping? 1 0  Comment her husband is help her temporarily until released by cardiologist.   Preparing Food and eating ? Y   Comment her husband is help her temporarily until released cardiologist   Using the Toilet? N   In the past six months, have you accidently leaked urine? N   Do you have problems with loss of bowel control? N   Managing your Medications? N   Managing your Finances? N   Housekeeping or managing your Housekeeping? Y   Comment she has a cleaning lady that comes twice a month.     Patient Education/ Literacy How often do you need to have someone help you when you read instructions, pamphlets, or other written materials from your doctor or pharmacy?: 1 - Never What is the last grade level you completed in school?: Bachelor's degree  Exercise Current Exercise Habits: The patient does not participate in regular exercise at present, Exercise limited by: cardiac condition(s)  Diet Patient reports consuming 3 meals a day and 1-2 snack(s) a day Patient reports that her primary diet is: Regular Patient reports that she does have regular access to food.   Depression Screen    01/30/2022    1:32 PM 11/25/2020    2:06 PM 04/21/2019    2:25 PM  11/19/2016    3:44 PM 04/05/2015    1:45 PM 01/16/2013    3:36 PM  PHQ 2/9 Scores  PHQ - 2 Score 0 0 0 0 0 0  Exception Documentation   Medical reason        Fall Risk    01/30/2022    1:32 PM 08/03/2021    1:26 PM 11/25/2020    2:03 PM 04/21/2019    2:24 PM 11/19/2016    3:44 PM  [place] in the past year? 0 0 0 0 No  Number falls in past yr: 0 0 0    Injury with Fall? 0 0 0    Risk for fall due to : No Fall Risks Impaired balance/gait No Fall Risks Impaired balance/gait   Follow up Falls evaluation completed Falls prevention discussed;Falls evaluation completed Falls evaluation completed Falls prevention discussed      Objective:  [name] seemed alert and oriented and she participated appropriately during our telephone visit.  Blood Pressure Weight BMI  BP Readings from Last 3 Encounters:  01/29/22 137/84  01/10/22 (!) 131/57  12/14/21 131/69   Wt Readings from Last 3 Encounters:  01/29/22 174 lb (78.9 kg)  11/27/21 176 lb 6.4 oz (80 kg)  11/24/21  177 lb (80.3 kg)   BMI Readings from Last 1 Encounters:  01/29/22 32.88 kg/m    *Unable to obtain current vital signs, weight, and BMI due to telephone visit type  Hearing/Vision  [name] did not seem to have difficulty with hearing/understanding during the telephone conversation Reports that she has had a formal eye exam by an eye care professional within the past year Reports that she has not had a formal hearing evaluation within the past year *Unable to fully assess hearing and vision during telephone visit type  Cognitive Function:    01/30/2022    1:41 PM 11/25/2020    2:32 PM 04/21/2019    2:28 PM  6CIT Screen  What Year? 0 points 0 points 0 points  What month? 0 points 0 points 0 points  What time? 0 points 0 points 0 points  Count back from 20 0 points 0 points 0 points  Months in reverse 0 points 0 points 0 points  Repeat phrase 0 points 0 points 0 points  Total Score 0 points 0 points 0  points   (Normal:0-7, Significant for Dysfunction: >8)  Normal Cognitive Function Screening: Yes   Immunization & Health Maintenance Record Immunization History  Administered Date(s) Administered   Fluad Quad(high Dose 65+) 12/24/2018, 12/28/2019, 12/14/2020, 01/29/2022   Influenza Split 11/07/2011, 01/12/2013   Influenza Whole 02/12/2005, 12/02/2007, 11/24/2008   Influenza, High Dose Seasonal PF 11/19/2016, 12/10/2017   Influenza,inj,Quad PF,6+ Mos 01/11/2015   Influenza-Unspecified 11/07/2011, 01/12/2013, 11/12/2013, 01/11/2015, 12/24/2018   PFIZER(Purple Top)SARS-COV-2 Vaccination 03/20/2019, 04/15/2019   Pneumococcal Conjugate-13 04/26/2014   Pneumococcal Polysaccharide-23 02/12/2001, 11/27/2001, 11/28/2006, 11/07/2011   Td 02/12/1998, 06/13/1998   Tdap 09/23/2012    Health Maintenance  Topic Date Due   COVID-19 Vaccine (3 - Pfizer risk series) 01/27/2023 (Originally 05/13/2019)   Zoster Vaccines- Shingrix (1 of 2) 02/10/2023 (Originally 11/18/1964)   DTaP/Tdap/Td (4 - Td or Tdap) 09/24/2022   Medicare Annual Wellness (AWV)  01/31/2023   COLONOSCOPY (Pts 45-50yr Insurance coverage will need to be confirmed)  08/05/2024   Pneumonia Vaccine 76 Years old  Completed   INFLUENZA VACCINE  Completed   DEXA SCAN  Completed   Hepatitis C Screening  Completed   HPV VACCINES  Aged Out       Assessment  This is a routine wellness examination for [employer]  Health Maintenance: Due or Overdue There are no preventive care reminders to display for this patient.   [name] Slickerdoes not need a referral for Community Assistance: Care Management:   no Social Work:    no Prescription Assistance:  no Nutrition/Diabetes Education:  no   Plan:  Personalized Goals  Goals Addressed               This Visit's Progress     Patient Stated (pt-stated)        Patient would like to get stronger and build her strength.       Personalized Health Maintenance &  Screening Recommendations  Shingles vaccine  Lung Cancer Screening Recommended: no (Low Dose CT Chest recommended if Age 78-80years, 30 pack-year currently smoking OR have quit w/in past 15 years) Hepatitis C Screening recommended: no HIV Screening recommended: no  Advanced Directives: Written information was not prepared per patient's request.  Referrals & Orders No orders of the defined types were placed in this encounter.   Follow-up Plan Follow-up with MHali Marry MD as planned Schedule shingles vaccine at the pharmacy. Medicare wellness visit in one  year. Patient will access AVS on my chart.   I have personally reviewed and noted the following in the patient's chart:   Medical and social history Use of alcohol, tobacco or illicit drugs  Current medications and supplements Functional ability and status Nutritional status Physical activity Advanced directives List of other physicians Hospitalizations, surgeries, and ER visits in previous 12 months Vitals Screenings to include cognitive, depression, and falls Referrals and appointments  In addition, I have reviewed and discussed with [name] certain preventive protocols, quality metrics, and best practice recommendations. A written personalized care plan for preventive services as well as general preventive health recommendations is available and can be mailed to the patient at her request.      [name] Gens, RN BSN  01/30/2022

## 2022-02-01 ENCOUNTER — Telehealth: Payer: Self-pay | Admitting: Cardiology

## 2022-02-01 NOTE — Telephone Encounter (Signed)
FYI: Patient wanted Dr. Stanford Breed to know that she was in the ED at [facility], recently. She had cath then pacer insertion. She stated it took a while to recover form it , but she is feeling better now.

## 2022-02-01 NOTE — Telephone Encounter (Signed)
Patient wanted to let Dr. Stanford Breed know she had to go to the ER earlier this month and she ended up getting a pacemaker placed.

## 2022-02-02 NOTE — Telephone Encounter (Signed)
Spoke with pt, aware we can see all of those records. She saw dt tyson for wound care and everything was fine.

## 2022-02-07 ENCOUNTER — Inpatient Hospital Stay (HOSPITAL_BASED_OUTPATIENT_CLINIC_OR_DEPARTMENT_OTHER): Payer: Medicare Other | Admitting: Family

## 2022-02-07 ENCOUNTER — Encounter: Payer: Self-pay | Admitting: Family

## 2022-02-07 ENCOUNTER — Other Ambulatory Visit: Payer: Self-pay

## 2022-02-07 ENCOUNTER — Inpatient Hospital Stay: Payer: Medicare Other | Attending: Hematology & Oncology

## 2022-02-07 VITALS — BP 123/53 | HR 85 | Temp 98.3°F | Resp 19 | Ht 61.0 in | Wt 174.4 lb

## 2022-02-07 DIAGNOSIS — E611 Iron deficiency: Secondary | ICD-10-CM | POA: Diagnosis not present

## 2022-02-07 DIAGNOSIS — Z96642 Presence of left artificial hip joint: Secondary | ICD-10-CM | POA: Insufficient documentation

## 2022-02-07 DIAGNOSIS — Z950 Presence of cardiac pacemaker: Secondary | ICD-10-CM | POA: Diagnosis not present

## 2022-02-07 DIAGNOSIS — D509 Iron deficiency anemia, unspecified: Secondary | ICD-10-CM | POA: Insufficient documentation

## 2022-02-07 LAB — FERRITIN: Ferritin: 218 ng/mL (ref 11–307)

## 2022-02-07 LAB — RETICULOCYTES
Immature Retic Fract: 12.3 % (ref 2.3–15.9)
RBC.: 4.44 MIL/uL (ref 3.87–5.11)
Retic Count, Absolute: 95.5 10*3/uL (ref 19.0–186.0)
Retic Ct Pct: 2.2 % (ref 0.4–3.1)

## 2022-02-07 LAB — CBC WITH DIFFERENTIAL (CANCER CENTER ONLY)
Abs Immature Granulocytes: 0.07 10*3/uL (ref 0.00–0.07)
Basophils Absolute: 0.1 10*3/uL (ref 0.0–0.1)
Basophils Relative: 1 %
Eosinophils Absolute: 0.9 10*3/uL — ABNORMAL HIGH (ref 0.0–0.5)
Eosinophils Relative: 10 %
HCT: 38.9 % (ref 36.0–46.0)
Hemoglobin: 12.5 g/dL (ref 12.0–15.0)
Immature Granulocytes: 1 %
Lymphocytes Relative: 16 %
Lymphs Abs: 1.4 10*3/uL (ref 0.7–4.0)
MCH: 28.3 pg (ref 26.0–34.0)
MCHC: 32.1 g/dL (ref 30.0–36.0)
MCV: 88 fL (ref 80.0–100.0)
Monocytes Absolute: 0.8 10*3/uL (ref 0.1–1.0)
Monocytes Relative: 9 %
Neutro Abs: 5.6 10*3/uL (ref 1.7–7.7)
Neutrophils Relative %: 63 %
Platelet Count: 310 10*3/uL (ref 150–400)
RBC: 4.42 MIL/uL (ref 3.87–5.11)
RDW: 15.9 % — ABNORMAL HIGH (ref 11.5–15.5)
WBC Count: 8.7 10*3/uL (ref 4.0–10.5)
nRBC: 0 % (ref 0.0–0.2)

## 2022-02-07 NOTE — Progress Notes (Signed)
Hematology and Oncology Follow Up Visit  [name] [id number] 10/18/1945 76 y.o. 02/07/2022   Principle Diagnosis:  Iron deficiency anemia   Current Therapy:   IV iron as indicated    Interim History:  [name] is here today for follow-up. She is doing quite well and has no complaints at this time.  She was hospitalized earlier this month for SVT and junctional rhythm. Heart cath was negative and she had a pacemaker placed on 01/23/2022 by Dr. Elonda Husky.  No blood loss noted. No bruising or petechiae.  No fever, chills, n/v, cough, rash, dizziness, SOB, chest pain, palpitations, abdominal pain or changes in bowel or bladder habits.  No swelling, tenderness in her extremities. Numbness and tingling in her fingertips comes and goes. She has chronic neck issues.  No falls or syncope.  Appetite and hydration are good. Weight is stable at 174 lbs.   ECOG Performance Status: 1 - Symptomatic but completely ambulatory  Medications:  Allergies as of 02/07/2022       Reactions   Moxifloxacin Other (See Comments)   REACTION: confusion, dizziness, paranoia   Astelin [azelastine Hcl] Other (See Comments)   Headache   Celebrex [celecoxib] Other (See Comments)   Stomach issues   Lisinopril Other (See Comments), Cough   Methotrexate Derivatives Other (See Comments)   Morphine Nausea Only        Medication List        Accurate as of February 07, 2022  2:47 PM. If you have any questions, ask your nurse or doctor.          albuterol 108 (90 Base) MCG/ACT inhaler Commonly known as: VENTOLIN HFA INHALE 2 PUFFS INTO THE LUNGS EVERY 6 HOURS AS NEEDED FOR SHORTNESS OF BREATH   buPROPion 300 MG 24 hr tablet Commonly known as: WELLBUTRIN XL Take 300 mg by mouth daily.   CALCIUM 600+D3 PO Take 1 tablet by mouth daily.   CBD KINGS EX Apply 1 Application topically in the morning and at bedtime.   clonazePAM 0.5 MG tablet Commonly known as: KLONOPIN Take 0.125 mg by mouth  daily as needed for anxiety.   famotidine 20 MG tablet Commonly known as: PEPCID Take 20 mg by mouth at bedtime.   fexofenadine 180 MG tablet Commonly known as: ALLEGRA Take 180 mg by mouth at bedtime.   folic acid 1 MG tablet Commonly known as: FOLVITE Take 1 mg by mouth 2 (two) times daily.   GENTEAL OP Place 1-2 drops into both eyes 3 (three) times daily as needed (for dry eyes).   GNP GINGKO BILOBA EXTRACT PO Take 1 tablet by mouth 3 (three) times daily with meals.   hydrochlorothiazide 25 MG tablet Commonly known as: HYDRODIURIL TAKE 1 TABLET BY MOUTH EVERY DAY   losartan 50 MG tablet Commonly known as: COZAAR TAKE 1 TABLET BY MOUTH EVERY DAY   Magnesium 100 MG Tabs Take 100 mg by mouth 2 (two) times a day.   meloxicam 15 MG tablet Commonly known as: MOBIC TAKE 1 TABLET BY MOUTH EVERY DAY IN THE MORNING   metroNIDAZOLE 1 % gel Commonly known as: Metrogel Apply topically daily. What changed:  how much to take when to take this reasons to take this   Paxil CR 12.5 MG 24 hr tablet Generic drug: PARoxetine Take 12.5 mg by mouth daily.   polyethylene glycol 17 g packet Commonly known as: MIRALAX / GLYCOLAX Take 17 g by mouth daily as needed for mild constipation.   Symbicort  80-4.5 MCG/ACT inhaler Generic drug: budesonide-formoterol TAKE 2 PUFFS BY MOUTH TWICE A DAY   Synthroid 75 MCG tablet Generic drug: levothyroxine TAKE 1 TABLET IN AM ON EMPTY STOMACH 6 DAYS PER WEEK FOR THYROID (NAME BRAND ONLY DAW) What changed: See the new instructions.   SYSTANE ULTRA PF OP Place 1 drop into both eyes daily as needed (dry eyes).   vitamin C with rose hips 1000 MG tablet Take 1,000 mg by mouth daily.   Vitamin D3 50 MCG (2000 UT) capsule Take 2,000 Units by mouth 3 (three) times a week.        Allergies:  Allergies  Allergen Reactions   Moxifloxacin Other (See Comments)    REACTION: confusion, dizziness, paranoia   Astelin [Azelastine Hcl] Other  (See Comments)    Headache   Celebrex [Celecoxib] Other (See Comments)    Stomach issues   Lisinopril Other (See Comments) and Cough   Methotrexate Derivatives Other (See Comments)   Morphine Nausea Only    Past Medical History, Surgical history, Social history, and Family History were reviewed and updated.  Review of Systems: All other 10 point review of systems is negative.   Physical Exam:  vitals were not taken for this visit.   Wt Readings from Last 3 Encounters:  01/29/22 174 lb (78.9 kg)  11/27/21 176 lb 6.4 oz (80 kg)  11/24/21 177 lb (80.3 kg)    Ocular: Sclerae unicteric, pupils equal, round and reactive to light Ear-nose-throat: Oropharynx clear, dentition fair Lymphatic: No cervical or supraclavicular adenopathy Lungs no rales or rhonchi, good excursion bilaterally Heart regular rate and rhythm, no murmur appreciated Abd soft, nontender, positive bowel sounds MSK no focal spinal tenderness, no joint edema Neuro: non-focal, well-oriented, appropriate affect Breasts: Deferred   Lab Results  Component Value Date   WBC 6.5 11/27/2021   HGB 12.5 11/27/2021   HCT 39.1 11/27/2021   MCV 84.8 11/27/2021   PLT 321 11/27/2021   Lab Results  Component Value Date   FERRITIN 25 11/27/2021   IRON 38 11/27/2021   TIBC 395 11/27/2021   UIBC 357 11/27/2021   IRONPCTSAT 10 (L) 11/27/2021   Lab Results  Component Value Date   RETICCTPCT 1.3 11/27/2021   RBC 4.61 11/27/2021   RBC 4.60 11/27/2021   No results found for: "KPAFRELGTCHN", "LAMBDASER", "KAPLAMBRATIO" No results found for: "IGGSERUM", "IGA", "IGMSERUM" No results found for: "TOTALPROTELP", "ALBUMINELP", "A1GS", "A2GS", "BETS", "BETA2SER", "GAMS", "MSPIKE", "SPEI"   Chemistry      Component Value Date/Time   NA 138 11/27/2021 1507   NA 138 10/30/2019 0000   NA 143 04/17/2012 1445   K 4.4 11/27/2021 1507   K 3.8 04/17/2012 1445   CL 101 11/27/2021 1507   CL 106 04/17/2012 1445   CO2 30 11/27/2021  1507   CO2 27 04/17/2012 1445   BUN 21 11/27/2021 1507   BUN 19 10/30/2019 0000   BUN 15.6 04/17/2012 1445   CREATININE 0.86 11/27/2021 1507   CREATININE 0.96 11/08/2021 0000   CREATININE 0.8 04/17/2012 1445   GLU 92 10/30/2019 0000      Component Value Date/Time   CALCIUM 10.0 11/27/2021 1507   CALCIUM 9.2 04/17/2012 1445   ALKPHOS 52 11/27/2021 1507   ALKPHOS 75 04/17/2012 1445   AST 15 11/27/2021 1507   AST 18 04/17/2012 1445   ALT 20 11/27/2021 1507   ALT 40 04/17/2012 1445   BILITOT 0.4 11/27/2021 1507   BILITOT 0.31 04/17/2012 1445  Impression and Plan: [name] is a very pleasant 76 yo caucasian female with history of iron deficiency anemia post total left hip replacement surgery.  Iron studies are pending.  Follow-up in 4 months.   Lottie Dawson, NP 12/27/20232:47 PM

## 2022-02-08 LAB — IRON AND IRON BINDING CAPACITY (CC-WL,HP ONLY)
Iron: 69 ug/dL (ref 28–170)
Saturation Ratios: 24 % (ref 10.4–31.8)
TIBC: 283 ug/dL (ref 250–450)
UIBC: 214 ug/dL (ref 148–442)

## 2022-02-13 DIAGNOSIS — F422 Mixed obsessional thoughts and acts: Secondary | ICD-10-CM | POA: Diagnosis not present

## 2022-02-13 DIAGNOSIS — F410 Panic disorder [episodic paroxysmal anxiety] without agoraphobia: Secondary | ICD-10-CM | POA: Diagnosis not present

## 2022-02-13 DIAGNOSIS — F902 Attention-deficit hyperactivity disorder, combined type: Secondary | ICD-10-CM | POA: Diagnosis not present

## 2022-03-08 DIAGNOSIS — H524 Presbyopia: Secondary | ICD-10-CM | POA: Diagnosis not present

## 2022-03-08 DIAGNOSIS — H2513 Age-related nuclear cataract, bilateral: Secondary | ICD-10-CM | POA: Diagnosis not present

## 2022-03-09 ENCOUNTER — Encounter: Payer: Self-pay | Admitting: Family Medicine

## 2022-03-09 ENCOUNTER — Other Ambulatory Visit: Payer: Self-pay | Admitting: Family Medicine

## 2022-03-09 MED ORDER — SYNTHROID 75 MCG PO TABS: ORAL_TABLET | ORAL | 4 refills | Status: DC

## 2022-03-15 MED ORDER — SYNTHROID 75 MCG PO TABS: 75.0000 ug | ORAL_TABLET | Freq: Every day | ORAL | 1 refills | Status: DC

## 2022-03-15 NOTE — Telephone Encounter (Signed)
Meds ordered this encounter  Medications   DISCONTD: SYNTHROID 75 MCG tablet    Sig: TAKE 1 TABLET IN AM ON EMPTY STOMACH 6 DAYS PER WEEK FOR THYROID (NAME BRAND ONLY DAW)    Dispense:  72 tablet    Refill:  4    PATIENT REQUESTING NEW RX   SYNTHROID 75 MCG tablet    Sig: Take 1 tablet (75 mcg total) by mouth daily before breakfast.    Dispense:  90 tablet    Refill:  1    PATIENT REQUESTING NEW RX

## 2022-03-27 ENCOUNTER — Ambulatory Visit: Payer: PRIVATE HEALTH INSURANCE | Admitting: General Practice

## 2022-03-29 ENCOUNTER — Other Ambulatory Visit: Payer: Self-pay | Admitting: Family Medicine

## 2022-03-29 DIAGNOSIS — Z1231 Encounter for screening mammogram for malignant neoplasm of breast: Secondary | ICD-10-CM

## 2022-04-02 DIAGNOSIS — I495 Sick sinus syndrome: Secondary | ICD-10-CM | POA: Diagnosis not present

## 2022-04-02 DIAGNOSIS — I700 Atherosclerosis of aorta: Secondary | ICD-10-CM | POA: Diagnosis not present

## 2022-04-02 DIAGNOSIS — I471 Supraventricular tachycardia, unspecified: Secondary | ICD-10-CM | POA: Diagnosis not present

## 2022-04-02 DIAGNOSIS — I10 Essential (primary) hypertension: Secondary | ICD-10-CM | POA: Diagnosis not present

## 2022-04-02 DIAGNOSIS — I498 Other specified cardiac arrhythmias: Secondary | ICD-10-CM | POA: Diagnosis not present

## 2022-04-17 DIAGNOSIS — F902 Attention-deficit hyperactivity disorder, combined type: Secondary | ICD-10-CM | POA: Diagnosis not present

## 2022-04-17 DIAGNOSIS — F410 Panic disorder [episodic paroxysmal anxiety] without agoraphobia: Secondary | ICD-10-CM | POA: Diagnosis not present

## 2022-04-17 DIAGNOSIS — F422 Mixed obsessional thoughts and acts: Secondary | ICD-10-CM | POA: Diagnosis not present

## 2022-04-23 ENCOUNTER — Ambulatory Visit (HOSPITAL_BASED_OUTPATIENT_CLINIC_OR_DEPARTMENT_OTHER): Payer: PRIVATE HEALTH INSURANCE | Admitting: Pulmonary Disease

## 2022-04-27 DIAGNOSIS — M26623 Arthralgia of bilateral temporomandibular joint: Secondary | ICD-10-CM | POA: Diagnosis not present

## 2022-05-02 ENCOUNTER — Encounter: Payer: Self-pay | Admitting: Family

## 2022-05-02 ENCOUNTER — Ambulatory Visit: Payer: Medicare Other

## 2022-05-02 DIAGNOSIS — Z1231 Encounter for screening mammogram for malignant neoplasm of breast: Secondary | ICD-10-CM | POA: Diagnosis not present

## 2022-05-03 ENCOUNTER — Ambulatory Visit (HOSPITAL_BASED_OUTPATIENT_CLINIC_OR_DEPARTMENT_OTHER): Payer: Medicare Other | Admitting: Pulmonary Disease

## 2022-05-03 ENCOUNTER — Encounter (HOSPITAL_BASED_OUTPATIENT_CLINIC_OR_DEPARTMENT_OTHER): Payer: Self-pay | Admitting: Pulmonary Disease

## 2022-05-03 VITALS — BP 116/74 | HR 87 | Ht 61.0 in | Wt 177.0 lb

## 2022-05-03 DIAGNOSIS — J4530 Mild persistent asthma, uncomplicated: Secondary | ICD-10-CM | POA: Diagnosis not present

## 2022-05-03 NOTE — Assessment & Plan Note (Signed)
Well-controlled on current dose of Symbicort. We had stepdown to 80 dosing, she only takes this once daily Continue on Allegra perennial, take breaks on Mucinex she will continue to use Flonase as needed for nasal congestion We discussed action plan for asthma exacerbation

## 2022-05-03 NOTE — Progress Notes (Signed)
   Subjective:    Patient ID: [name], female    DOB: 1945/10/03, 77 y.o.   MRN: [id number]  HPI  77  yo woman for FU mild persistent asthma, allergic rhinitis and chronic cough with vocal cord dysfunction Symptoms dramatically improved after she got rid of her cats   PMH - RA/OA , previously on Methotrexate.  PPM Meds  -Flonase took her sense of smell away   Chief Complaint  Patient presents with   Follow-up    Pt states she has been doing okay since last visit and denies any complaints.   I am seeing her after 3 years, last seen by APP 9 months ago. She has been doing well.  She has to use Allegra only around.  She also uses Mucinex daily. She had a pacemaker placed for junctional bradycardia. She reports reflux symptoms and sleeps with head of bed elevated. She stays busy with her grandchildren. She has expensive filters in her house. She admits to using Symbicort once daily instead of twice but this seems to control her symptoms.  She has not had an exacerbation in the last year.  She wonders on the effect long-term of inhaled steroids  Reviewed cardiology visit -she was started on metoprolol nightly for SVT  Significant tests/ events reviewed  Spirometry 06/2013 ratio 63 FEV1 normal.   Review of Systems neg for any significant sore throat, dysphagia, itching, sneezing, nasal congestion or excess/ purulent secretions, fever, chills, sweats, unintended wt loss, pleuritic or exertional cp, hempoptysis, orthopnea pnd or change in chronic leg swelling. Also denies presyncope, palpitations, heartburn, abdominal pain, nausea, vomiting, diarrhea or change in bowel or urinary habits, dysuria,hematuria, rash, arthralgias, visual complaints, headache, numbness weakness or ataxia.     Objective:   Physical Exam  Gen. Pleasant, well-nourished, in no distress ENT - no thrush, no pallor/icterus,no post nasal drip Neck: No JVD, no thyromegaly, no carotid bruits Lungs: no use of  accessory muscles, no dullness to percussion, clear without rales or rhonchi  Cardiovascular: Rhythm regular, heart sounds  normal, no murmurs or gallops, no peripheral edema Musculoskeletal: No deformities, no cyanosis or clubbing        Assessment & Plan:   SVT -started on beta-blockade and tolerating well

## 2022-05-03 NOTE — Patient Instructions (Signed)
Glad you are doing well !  Refills as needed

## 2022-05-04 NOTE — Progress Notes (Signed)
Please call patient. Normal mammogram.  Repeat in 1 year.  

## 2022-05-15 DIAGNOSIS — F410 Panic disorder [episodic paroxysmal anxiety] without agoraphobia: Secondary | ICD-10-CM | POA: Diagnosis not present

## 2022-05-15 DIAGNOSIS — F422 Mixed obsessional thoughts and acts: Secondary | ICD-10-CM | POA: Diagnosis not present

## 2022-05-15 DIAGNOSIS — F902 Attention-deficit hyperactivity disorder, combined type: Secondary | ICD-10-CM | POA: Diagnosis not present

## 2022-05-21 ENCOUNTER — Telehealth: Payer: Self-pay | Admitting: Family Medicine

## 2022-05-21 DIAGNOSIS — R7301 Impaired fasting glucose: Secondary | ICD-10-CM

## 2022-05-21 DIAGNOSIS — E038 Other specified hypothyroidism: Secondary | ICD-10-CM

## 2022-05-21 DIAGNOSIS — I700 Atherosclerosis of aorta: Secondary | ICD-10-CM

## 2022-05-21 NOTE — Telephone Encounter (Signed)
Called to inform pt of labs being ordered

## 2022-05-21 NOTE — Telephone Encounter (Signed)
Patient called she is requesting labs been ordered prior to her appointment  on 06-14-22 so that you can go over the results while she is here

## 2022-05-22 ENCOUNTER — Ambulatory Visit: Payer: PRIVATE HEALTH INSURANCE | Admitting: Family Medicine

## 2022-05-29 ENCOUNTER — Ambulatory Visit (INDEPENDENT_AMBULATORY_CARE_PROVIDER_SITE_OTHER): Payer: Medicare Other

## 2022-05-29 ENCOUNTER — Encounter: Payer: Self-pay | Admitting: Family Medicine

## 2022-05-29 ENCOUNTER — Ambulatory Visit (INDEPENDENT_AMBULATORY_CARE_PROVIDER_SITE_OTHER): Payer: Medicare Other | Admitting: Family Medicine

## 2022-05-29 VITALS — BP 127/53 | HR 71 | Temp 98.6°F

## 2022-05-29 DIAGNOSIS — R509 Fever, unspecified: Secondary | ICD-10-CM

## 2022-05-29 DIAGNOSIS — R059 Cough, unspecified: Secondary | ICD-10-CM | POA: Diagnosis not present

## 2022-05-29 DIAGNOSIS — R0989 Other specified symptoms and signs involving the circulatory and respiratory systems: Secondary | ICD-10-CM

## 2022-05-29 LAB — POC COVID19 BINAXNOW: SARS Coronavirus 2 Ag: NEGATIVE

## 2022-05-29 LAB — POCT INFLUENZA A/B
Influenza A, POC: NEGATIVE
Influenza B, POC: NEGATIVE

## 2022-05-29 MED ORDER — AMOXICILLIN-POT CLAVULANATE 875-125 MG PO TABS: 1.0000 | ORAL_TABLET | Freq: Two times a day (BID) | ORAL | 0 refills | Status: DC

## 2022-05-29 NOTE — Progress Notes (Signed)
Acute Office Visit  Subjective:     Patient ID: [name], female    DOB: [birth year], 77 y.o.   MRN: [id number]  Chief Complaint  Patient presents with   Cough   Fever    HPI Patient is in today for fever, cough, bodyaches.  He says starting about 8 to 9 days ago she had a mild cough.  She said she really did not think much about it she did not physically feel bad but then on Thursday a few days later she actually threw out her back.  By Saturday, 4 days ago she actually started to physically feel bad she was feeling body aches fatigue.  No real sinus symptoms.  No sore throat.  This morning she felt like her chest was tight.   ROS      Objective:    BP (!) 127/53   Pulse 71   Temp 98.6 F (37 C)   SpO2 95%    Physical Exam Constitutional:      Appearance: She is well-developed.  HENT:     Head: Normocephalic and atraumatic.     Right Ear: External ear normal.     Left Ear: External ear normal.     Nose: Nose normal.  Eyes:     Conjunctiva/sclera: Conjunctivae normal.     Pupils: Pupils are equal, round, and reactive to light.  Neck:     Thyroid: No thyromegaly.  Cardiovascular:     Rate and Rhythm: Normal rate and regular rhythm.     Heart sounds: Normal heart sounds.  Pulmonary:     Effort: Pulmonary effort is normal.     Breath sounds: Rhonchi present. No wheezing.     Comments: Diffuse rhonchi.  Musculoskeletal:     Cervical back: Neck supple.  Lymphadenopathy:     Cervical: No cervical adenopathy.  Skin:    General: Skin is warm and dry.  Neurological:     Mental Status: She is alert and oriented to person, place, and time.     Results for orders placed or performed in visit on 05/29/22  POC COVID-19  Result Value Ref Range   SARS Coronavirus 2 Ag Negative Negative  POCT Influenza A/B  Result Value Ref Range   Influenza A, POC Negative Negative   Influenza B, POC Negative Negative        Assessment & Plan:   Problem List Items  Addressed This Visit   None Visit Diagnoses     Cough, unspecified type    -  Primary   Relevant Orders   POC COVID-19 (Completed)   POCT Influenza A/B (Completed)   DG Chest 2 View (Completed)   Fever, unspecified fever cause       Relevant Orders   POC COVID-19 (Completed)   POCT Influenza A/B (Completed)   DG Chest 2 View (Completed)      Cough/fever-Unclear if viral or bacterial at this point symptoms have been going on for about 8 to 9 days and fever started 4 days ago during mid illness.  Will quad and start Augmentin we will get a chest x-ray today.  Pulse ox is reassuring that this morning she said she felt extremely short of breath.  History of underlying asthma.  Use albuterol as needed.  Meds ordered this encounter  Medications   amoxicillin-clavulanate (AUGMENTIN) 875-125 MG tablet    Sig: Take 1 tablet by mouth 2 (two) times daily.    Dispense:  14 tablet    Refill:  0    No follow-ups on file.  Nani Gasser, MD

## 2022-05-29 NOTE — Progress Notes (Signed)
Hi [name], chest x-ray showed a questionable area in that right lower lobe it could be an early pneumonia so definitely pick up the antibiotic and start that.  Also want you to use your albuterol 2 to 3 puffs 3 times a day for the next several days to keep your chest open.  If you feel like you are getting worse or developing new or worsening symptoms then please let us know.  Like to recheck your chest in 1 week.

## 2022-05-31 ENCOUNTER — Ambulatory Visit: Payer: PRIVATE HEALTH INSURANCE | Admitting: Family Medicine

## 2022-05-31 ENCOUNTER — Encounter: Payer: Self-pay | Admitting: Family Medicine

## 2022-06-01 MED ORDER — PREDNISONE 20 MG PO TABS: 40.0000 mg | ORAL_TABLET | Freq: Every day | ORAL | 0 refills | Status: DC

## 2022-06-04 ENCOUNTER — Other Ambulatory Visit: Payer: Self-pay

## 2022-06-04 ENCOUNTER — Encounter: Payer: Self-pay | Admitting: Family

## 2022-06-04 ENCOUNTER — Inpatient Hospital Stay: Payer: Medicare Other | Attending: Hematology & Oncology

## 2022-06-04 ENCOUNTER — Inpatient Hospital Stay (HOSPITAL_BASED_OUTPATIENT_CLINIC_OR_DEPARTMENT_OTHER): Payer: Medicare Other | Admitting: Family

## 2022-06-04 VITALS — BP 126/58 | HR 82 | Temp 98.1°F | Resp 20 | Ht 61.0 in | Wt 178.1 lb

## 2022-06-04 DIAGNOSIS — Z96642 Presence of left artificial hip joint: Secondary | ICD-10-CM | POA: Insufficient documentation

## 2022-06-04 DIAGNOSIS — D509 Iron deficiency anemia, unspecified: Secondary | ICD-10-CM | POA: Diagnosis not present

## 2022-06-04 DIAGNOSIS — E611 Iron deficiency: Secondary | ICD-10-CM

## 2022-06-04 LAB — CBC WITH DIFFERENTIAL (CANCER CENTER ONLY)
Abs Immature Granulocytes: 0.21 10*3/uL — ABNORMAL HIGH (ref 0.00–0.07)
Basophils Absolute: 0.1 10*3/uL (ref 0.0–0.1)
Basophils Relative: 1 %
Eosinophils Absolute: 0.1 10*3/uL (ref 0.0–0.5)
Eosinophils Relative: 1 %
HCT: 38.7 % (ref 36.0–46.0)
Hemoglobin: 12.9 g/dL (ref 12.0–15.0)
Immature Granulocytes: 2 %
Lymphocytes Relative: 24 %
Lymphs Abs: 2.4 10*3/uL (ref 0.7–4.0)
MCH: 29.4 pg (ref 26.0–34.0)
MCHC: 33.3 g/dL (ref 30.0–36.0)
MCV: 88.2 fL (ref 80.0–100.0)
Monocytes Absolute: 1 10*3/uL (ref 0.1–1.0)
Monocytes Relative: 10 %
Neutro Abs: 6.4 10*3/uL (ref 1.7–7.7)
Neutrophils Relative %: 62 %
Platelet Count: 319 10*3/uL (ref 150–400)
RBC: 4.39 MIL/uL (ref 3.87–5.11)
RDW: 12.9 % (ref 11.5–15.5)
WBC Count: 10.1 10*3/uL (ref 4.0–10.5)
nRBC: 0 % (ref 0.0–0.2)

## 2022-06-04 LAB — FERRITIN: Ferritin: 192 ng/mL (ref 11–307)

## 2022-06-04 LAB — RETICULOCYTES
Immature Retic Fract: 15.9 % (ref 2.3–15.9)
RBC.: 4.37 MIL/uL (ref 3.87–5.11)
Retic Count, Absolute: 87.8 10*3/uL (ref 19.0–186.0)
Retic Ct Pct: 2 % (ref 0.4–3.1)

## 2022-06-04 NOTE — Progress Notes (Signed)
Hematology and Oncology Follow Up Visit  [name] [id number] 01/21/1946 77 y.o. 06/04/2022   Principle Diagnosis:  Iron deficiency anemia    Current Therapy:        IV iron as indicated    Interim History:  [name] is here today for follow-up. She is recuperating from a bout with pneumonia. She states that she became symptomatic while visiting her brother in [place]. She finishes her Augmentin today and has a couple more days left on her steroid taper.  She is feeling better but still notes fatigue.  SOB and dizziness have improved.  No fever, chills, n/v, cough, rash, chest pain, palpitations, abdominal pain or changes in bowel or bladder habits.  No swelling, tenderness, numbness or tingling in her extremities at this time.  No falls or syncope.  Appetite and hydration are improving. Her weight is stable at 178 lbs.   ECOG Performance Status: 1 - Symptomatic but completely ambulatory  Medications:  Allergies as of 06/04/2022       Reactions   Moxifloxacin Other (See Comments)   REACTION: confusion, dizziness, paranoia   Astelin [azelastine Hcl] Other (See Comments)   Headache   Celebrex [celecoxib] Other (See Comments)   Stomach issues   Lisinopril Cough   Methotrexate Derivatives Other (See Comments)   Morphine Nausea Only        Medication List        Accurate as of June 04, 2022 12:58 PM. If you have any questions, ask your nurse or doctor.          albuterol 108 (90 Base) MCG/ACT inhaler Commonly known as: VENTOLIN HFA INHALE 2 PUFFS INTO THE LUNGS EVERY 6 HOURS AS NEEDED FOR SHORTNESS OF BREATH   amoxicillin-clavulanate 875-125 MG tablet Commonly known as: AUGMENTIN Take 1 tablet by mouth 2 (two) times daily.   buPROPion 300 MG 24 hr tablet Commonly known as: WELLBUTRIN XL Take 300 mg by mouth daily.   CALCIUM 600+D3 PO Take 1 tablet by mouth daily.   CBD KINGS EX Apply 1 Application topically in the morning and at bedtime.    clonazePAM 0.5 MG tablet Commonly known as: KLONOPIN Take 0.125 mg by mouth daily as needed for anxiety.   famotidine 20 MG tablet Commonly known as: PEPCID Take 20 mg by mouth at bedtime.   fexofenadine 180 MG tablet Commonly known as: ALLEGRA Take 180 mg by mouth at bedtime.   folic acid 1 MG tablet Commonly known as: FOLVITE Take 1 mg by mouth daily.   GENTEAL OP Place 1-2 drops into both eyes 3 (three) times daily as needed (for dry eyes).   GNP GINGKO BILOBA EXTRACT PO Take 1 tablet by mouth 3 (three) times daily with meals.   hydrochlorothiazide 25 MG tablet Commonly known as: HYDRODIURIL TAKE 1 TABLET BY MOUTH EVERY DAY   losartan 50 MG tablet Commonly known as: COZAAR TAKE 1 TABLET BY MOUTH EVERY DAY   Magnesium 100 MG Tabs Take 100 mg by mouth 2 (two) times a day.   meloxicam 15 MG tablet Commonly known as: MOBIC TAKE 1 TABLET BY MOUTH EVERY DAY IN THE MORNING   metroNIDAZOLE 1 % gel Commonly known as: Metrogel Apply topically daily.   Paxil CR 12.5 MG 24 hr tablet Generic drug: PARoxetine Take 12.5 mg by mouth daily.   polyethylene glycol 17 g packet Commonly known as: MIRALAX / GLYCOLAX Take 17 g by mouth daily as needed for mild constipation.   predniSONE 20 MG tablet  Commonly known as: DELTASONE Take 2 tablets (40 mg total) by mouth daily with breakfast.   Symbicort 80-4.5 MCG/ACT inhaler Generic drug: budesonide-formoterol TAKE 2 PUFFS BY MOUTH TWICE A DAY   Synthroid 75 MCG tablet Generic drug: levothyroxine Take 1 tablet (75 mcg total) by mouth daily before breakfast.   SYSTANE ULTRA PF OP Place 1 drop into both eyes daily as needed (dry eyes).   vitamin C with rose hips 1000 MG tablet Take 1,000 mg by mouth daily.   Vitamin D3 50 MCG (2000 UT) capsule Take 2,000 Units by mouth 3 (three) times a week.        Allergies:  Allergies  Allergen Reactions   Moxifloxacin Other (See Comments)    REACTION: confusion, dizziness,  paranoia   Astelin [Azelastine Hcl] Other (See Comments)    Headache   Celebrex [Celecoxib] Other (See Comments)    Stomach issues   Lisinopril Cough   Methotrexate Derivatives Other (See Comments)   Morphine Nausea Only    Past Medical History, Surgical history, Social history, and Family History were reviewed and updated.  Review of Systems: All other 10 point review of systems is negative.   Physical Exam:  vitals were not taken for this visit.   Wt Readings from Last 3 Encounters:  05/03/22 177 lb (80.3 kg)  02/07/22 174 lb 6.4 oz (79.1 kg)  01/29/22 174 lb (78.9 kg)    Ocular: Sclerae unicteric, pupils equal, round and reactive to light Ear-nose-throat: Oropharynx clear, dentition fair Lymphatic: No cervical or supraclavicular adenopathy Lungs no rales or rhonchi, good excursion bilaterally Heart regular rate and rhythm, no murmur appreciated Abd soft, nontender, positive bowel sounds MSK no focal spinal tenderness, no joint edema Neuro: non-focal, well-oriented, appropriate affect Breasts: Deferred   Lab Results  Component Value Date   WBC 8.7 02/07/2022   HGB 12.5 02/07/2022   HCT 38.9 02/07/2022   MCV 88.0 02/07/2022   PLT 310 02/07/2022   Lab Results  Component Value Date   FERRITIN 218 02/07/2022   IRON 69 02/07/2022   TIBC 283 02/07/2022   UIBC 214 02/07/2022   IRONPCTSAT 24 02/07/2022   Lab Results  Component Value Date   RETICCTPCT 2.2 02/07/2022   RBC 4.44 02/07/2022   No results found for: "KPAFRELGTCHN", "LAMBDASER", "KAPLAMBRATIO" No results found for: "IGGSERUM", "IGA", "IGMSERUM" No results found for: "TOTALPROTELP", "ALBUMINELP", "A1GS", "A2GS", "BETS", "BETA2SER", "GAMS", "MSPIKE", "SPEI"   Chemistry      Component Value Date/Time   NA 138 11/27/2021 1507   NA 138 10/30/2019 0000   NA 143 04/17/2012 1445   K 4.4 11/27/2021 1507   K 3.8 04/17/2012 1445   CL 101 11/27/2021 1507   CL 106 04/17/2012 1445   CO2 30 11/27/2021 1507    CO2 27 04/17/2012 1445   BUN 21 11/27/2021 1507   BUN 19 10/30/2019 0000   BUN 15.6 04/17/2012 1445   CREATININE 0.86 11/27/2021 1507   CREATININE 0.96 11/08/2021 0000   CREATININE 0.8 04/17/2012 1445   GLU 92 10/30/2019 0000      Component Value Date/Time   CALCIUM 10.0 11/27/2021 1507   CALCIUM 9.2 04/17/2012 1445   ALKPHOS 52 11/27/2021 1507   ALKPHOS 75 04/17/2012 1445   AST 15 11/27/2021 1507   AST 18 04/17/2012 1445   ALT 20 11/27/2021 1507   ALT 40 04/17/2012 1445   BILITOT 0.4 11/27/2021 1507   BILITOT 0.31 04/17/2012 1445       Impression and Plan:  Ms. Angelyn Punt is a very pleasant 77 yo caucasian female with history of iron deficiency anemia post total left hip replacement surgery.  Iron studies are pending.  Follow-up in 6 months.   Eileen Stanford, NP 4/22/202412:58 PM

## 2022-06-05 ENCOUNTER — Encounter: Payer: Self-pay | Admitting: Family Medicine

## 2022-06-05 ENCOUNTER — Ambulatory Visit (INDEPENDENT_AMBULATORY_CARE_PROVIDER_SITE_OTHER): Payer: Medicare Other | Admitting: Family Medicine

## 2022-06-05 VITALS — BP 122/78 | HR 93 | Ht 61.0 in | Wt 177.0 lb

## 2022-06-05 DIAGNOSIS — I10 Essential (primary) hypertension: Secondary | ICD-10-CM

## 2022-06-05 DIAGNOSIS — M47812 Spondylosis without myelopathy or radiculopathy, cervical region: Secondary | ICD-10-CM | POA: Diagnosis not present

## 2022-06-05 DIAGNOSIS — J189 Pneumonia, unspecified organism: Secondary | ICD-10-CM

## 2022-06-05 LAB — IRON AND IRON BINDING CAPACITY (CC-WL,HP ONLY)
Iron: 107 ug/dL (ref 28–170)
Saturation Ratios: 33 % — ABNORMAL HIGH (ref 10.4–31.8)
TIBC: 326 ug/dL (ref 250–450)
UIBC: 219 ug/dL (ref 148–442)

## 2022-06-05 MED ORDER — HYDROCHLOROTHIAZIDE 25 MG PO TABS: 25.0000 mg | ORAL_TABLET | Freq: Every day | ORAL | 3 refills | Status: DC

## 2022-06-05 MED ORDER — LIDOCAINE 5 % EX PTCH
1.0000 | MEDICATED_PATCH | CUTANEOUS | 1 refills | Status: DC
Start: 2022-06-05 — End: 2023-07-10

## 2022-06-05 NOTE — Progress Notes (Signed)
   Established Patient Office Visit  Subjective   Patient ID: [name], female    DOB: [birth year]  Age: 77 y.o. MRN: [id number]  Chief Complaint  Patient presents with   Follow-up         HPI  Is here for follow-up right-sided pneumonia.  She just completed the steroids this morning she feels about 95% better.  Still just a little bit of cough but energy levels are coming back that she did lay down taken out yesterday which is a little bit unusual for her.  She also had follow-up  with hematologist and her hemoglobin is up to 12.9 which is great so her iron infusions have worked well.    ROS    Objective:     BP 122/78   Pulse 93   Ht  (1.549 m)   Wt 177 lb (80.3 kg)   SpO2 95%   BMI 33.44 kg/m    Physical Exam Vitals and nursing note reviewed.  Constitutional:      Appearance: She is well-developed.  HENT:     Head: Normocephalic and atraumatic.  Cardiovascular:     Rate and Rhythm: Normal rate and regular rhythm.     Heart sounds: Normal heart sounds.  Pulmonary:     Effort: Pulmonary effort is normal.     Breath sounds: Normal breath sounds.  Skin:    General: Skin is warm and dry.  Neurological:     Mental Status: She is alert and oriented to person, place, and time.  Psychiatric:        Behavior: Behavior normal.      No results found for any visits on 06/05/22.    The 10-year ASCVD risk score (Arnett DK, et al., 2019) is: 44.3%    Assessment & Plan:   Problem List Items Addressed This Visit       Cardiovascular and Mediastinum   Essential hypertension, benign    Would [facility] she is trying to get all of her medications switched over one by one.  She is not due for the losartan yet but this summer would like to [facility] [address] [facility].      Relevant Medications   hydrochlorothiazide (HYDRODIURIL) 25 MG tablet     Musculoskeletal and Integument   Spondylosis without  myelopathy or radiculopathy, cervical region    She gets good relief with topical lidocaine patches and would like to have an updated prescription.      Relevant Medications   lidocaine (LIDODERM) 5 %   Other Visit Diagnoses     Pneumonia of right lung due to infectious organism, unspecified part of lung    -  Primary       Pneumonia-she is significantly better.  She still has a little bit of rhonchi bilaterally on exam with a little bit of mucus when she coughs.  But this should hopefully improve over the next week or 2.  If not then please let us know.  No follow-ups on file.    Nani Gasser, MD

## 2022-06-05 NOTE — Assessment & Plan Note (Signed)
She gets good relief with topical lidocaine patches and would like to have an updated prescription.

## 2022-06-05 NOTE — Assessment & Plan Note (Signed)
Would like the [facility] she is trying to get all of her medications switched over one by one.  She is not due for the losartan yet but this summer would like to have that [facility] [address] [facility].

## 2022-06-08 ENCOUNTER — Other Ambulatory Visit: Payer: Self-pay | Admitting: Family Medicine

## 2022-06-08 ENCOUNTER — Ambulatory Visit: Payer: PRIVATE HEALTH INSURANCE | Admitting: Family

## 2022-06-08 ENCOUNTER — Other Ambulatory Visit: Payer: PRIVATE HEALTH INSURANCE

## 2022-06-11 ENCOUNTER — Encounter (HOSPITAL_BASED_OUTPATIENT_CLINIC_OR_DEPARTMENT_OTHER): Payer: Self-pay | Admitting: Pulmonary Disease

## 2022-06-11 ENCOUNTER — Ambulatory Visit (INDEPENDENT_AMBULATORY_CARE_PROVIDER_SITE_OTHER): Payer: Medicare Other | Admitting: Pulmonary Disease

## 2022-06-11 ENCOUNTER — Ambulatory Visit (INDEPENDENT_AMBULATORY_CARE_PROVIDER_SITE_OTHER): Payer: Medicare Other

## 2022-06-11 VITALS — BP 114/70 | HR 91 | Temp 98.1°F | Ht 62.0 in | Wt 176.2 lb

## 2022-06-11 DIAGNOSIS — J189 Pneumonia, unspecified organism: Secondary | ICD-10-CM | POA: Diagnosis not present

## 2022-06-11 DIAGNOSIS — J4530 Mild persistent asthma, uncomplicated: Secondary | ICD-10-CM

## 2022-06-11 DIAGNOSIS — I509 Heart failure, unspecified: Secondary | ICD-10-CM | POA: Diagnosis not present

## 2022-06-11 NOTE — Patient Instructions (Signed)
x chest x-ray to follow-up on pneumonia

## 2022-06-11 NOTE — Assessment & Plan Note (Signed)
Slight exacerbation related to viral pneumonia Use Symbicort 2 puffs twice daily and then can switch to as needed once symptoms resolve

## 2022-06-11 NOTE — Progress Notes (Signed)
   Subjective:    Patient ID: [name], female    DOB: 1945/08/31, 77 y.o.   MRN: [id number]  HPI  77  yo woman for FU mild persistent asthma, allergic rhinitis and chronic cough with vocal cord dysfunction Symptoms dramatically improved after she got rid of her cats     PMH - RA/OA , previously on Methotrexate.  PPM Iron deficiency anemia Meds  -Flonase took her sense of smell away   Last visit with [place] was 3/21. She went to [place] for [employer].  Just before leaving for the air, she started developing generalized weakness, body aches and then developed fever of 103, cough which lasted for 7 to 8 days.  Saw PCP CXR 4/16 RLL infx Given Augmentin and course of prednisone for about a week.  Labs showed hemoglobin improved to 12.9 after iron infusions  She still feels weak, sputum was initially green and is now turned white.  She denies chest pain or wheezing.  She was taking Symbicort regularly during this episode and is now decreased to 2 puffs once daily  Significant tests/ events reviewed   Spirometry 06/2013 ratio 63 FEV1 normal  Review of Systems neg for any significant sore throat, dysphagia, itching, sneezing, nasal congestion or excess/ purulent secretions, fever, chills, sweats, unintended wt loss, pleuritic or exertional cp, hempoptysis, orthopnea pnd or change in chronic leg swelling. Also denies presyncope, palpitations, heartburn, abdominal pain, nausea, vomiting, diarrhea or change in bowel or urinary habits, dysuria,hematuria, rash, arthralgias, visual complaints, headache, numbness weakness or ataxia.     Objective:   Physical Exam  Gen. Pleasant, well-nourished, in no distress ENT - no thrush, no pallor/icterus,no post nasal drip Neck: No JVD, no thyromegaly, no carotid bruits Lungs: no use of accessory muscles, no dullness to percussion, clear without rales or rhonchi  Cardiovascular: Rhythm regular, heart sounds  normal, no murmurs or  gallops, no peripheral edema Musculoskeletal: No deformities, no cyanosis or clubbing         Assessment & Plan:   Community-acquired pneumonia -right lower lobe, chest x-ray reviewed. Symptomatically improved but still has some low-grade persistent symptoms. Obtain chest x-ray to follow-up

## 2022-06-14 ENCOUNTER — Ambulatory Visit: Payer: PRIVATE HEALTH INSURANCE | Admitting: Family Medicine

## 2022-06-15 DIAGNOSIS — M1711 Unilateral primary osteoarthritis, right knee: Secondary | ICD-10-CM | POA: Diagnosis not present

## 2022-06-19 ENCOUNTER — Telehealth: Payer: Self-pay | Admitting: *Deleted

## 2022-06-19 DIAGNOSIS — F422 Mixed obsessional thoughts and acts: Secondary | ICD-10-CM | POA: Diagnosis not present

## 2022-06-19 DIAGNOSIS — F410 Panic disorder [episodic paroxysmal anxiety] without agoraphobia: Secondary | ICD-10-CM | POA: Diagnosis not present

## 2022-06-19 DIAGNOSIS — F902 Attention-deficit hyperactivity disorder, combined type: Secondary | ICD-10-CM | POA: Diagnosis not present

## 2022-06-19 NOTE — Telephone Encounter (Signed)
   Name: [name]  DOB: [birth year]  MRN: [id number]  Primary Cardiologist: Olga Millers, MD   Preoperative team, please contact this patient and set up a phone call appointment for further preoperative risk assessment. Please obtain consent and complete medication review. Thank you for your help.  I confirm that guidance regarding antiplatelet and oral anticoagulation therapy has been completed and, if necessary, noted below.  None requested.    Carlos Levering, NP 06/19/2022, 3:29 PM [facility]

## 2022-06-19 NOTE — Telephone Encounter (Signed)
   Pre-operative Risk Assessment    Patient Name: [name]  DOB: 11-May-1945 MRN: [id number]      Request for Surgical Clearance    Procedure:   RIGHT TOTAL KNEE ARTHROPLASTY  Date of Surgery:  Clearance 07/31/22                                 Surgeon:  DR. MATTHEW OLIN Surgeon's Group or Practice Name:  [facility] Phone number:  [phone] ATTN: Rosalva Ferron Fax number:  [phone]   Type of Clearance Requested:   - Medical ; NO MEDICATIONS LISTED AS NEEDING TO BE HELD   Type of Anesthesia:  Spinal   Additional requests/questions:    Elpidio Anis   06/19/2022, 9:28 AM

## 2022-06-20 ENCOUNTER — Telehealth: Payer: Self-pay | Admitting: *Deleted

## 2022-06-20 NOTE — Telephone Encounter (Signed)
S/w pt is scheduled telephone clearance appt.    Patient Consent for Virtual Visit         [name] has provided verbal consent on 06/20/2022 for a virtual visit (video or telephone).   CONSENT FOR VIRTUAL VISIT FOR:  [name]  By participating in this virtual visit I agree to the following:  I hereby voluntarily request, consent and authorize [facility] and its employed or contracted physicians, physician assistants, nurse practitioners or other licensed health care professionals (the Practitioner), to provide me with telemedicine health care services (the "Services") as deemed necessary by the treating Practitioner. I acknowledge and consent to receive the Services by the Practitioner via telemedicine. I understand that the telemedicine visit will involve communicating with the Practitioner through live audiovisual communication technology and the disclosure of certain medical information by electronic transmission. I acknowledge that I have been given the opportunity to request an in-person assessment or other available alternative prior to the telemedicine visit and am voluntarily participating in the telemedicine visit.  I understand that I have the right to withhold or withdraw my consent to the use of telemedicine in the course of my care at any time, without affecting my right to future care or treatment, and that the Practitioner or I may terminate the telemedicine visit at any time. I understand that I have the right to inspect all information obtained and/or recorded in the course of the telemedicine visit and may receive copies of available information for a reasonable fee.  I understand that some of the potential risks of receiving the Services via telemedicine include:  Delay or interruption in medical evaluation due to technological equipment failure or disruption; Information transmitted may not be sufficient (e.g. poor resolution of images) to allow for  appropriate medical decision making by the Practitioner; and/or  In rare instances, security protocols could fail, causing a breach of personal health information.  Furthermore, I acknowledge that it is my responsibility to provide information about my medical history, conditions and care that is complete and accurate to the best of my ability. I acknowledge that Practitioner's advice, recommendations, and/or decision may be based on factors not within their control, such as incomplete or inaccurate data provided by me or distortions of diagnostic images or specimens that may result from electronic transmissions. I understand that the practice of medicine is not an exact science and that Practitioner makes no warranties or guarantees regarding treatment outcomes. I acknowledge that a copy of this consent can be made available to me via my patient portal [facility] MyChart), or I can request a printed copy by calling the office of  [facility].    I understand that my insurance will be billed for this visit.   I have read or had this consent read to me. I understand the contents of this consent, which adequately explains the benefits and risks of the Services being provided via telemedicine.  I have been provided ample opportunity to ask questions regarding this consent and the Services and have had my questions answered to my satisfaction. I give my informed consent for the services to be provided through the use of telemedicine in my medical care

## 2022-06-22 ENCOUNTER — Telehealth: Payer: Self-pay | Admitting: Family Medicine

## 2022-06-22 NOTE — Telephone Encounter (Signed)
Would she consider going up to the 25 mg dose we could see if they have that in stock.  She has done well with this 1 so I would suspect she would do well with the slightly increased dose.  Or if she would prefer that I just change it that is fine to.

## 2022-06-22 NOTE — Telephone Encounter (Addendum)
Pt  called. Her pharmacy out of Paxil 12.5 mg. She has called around to other phmarmacies and they don't have the med either. She has tried the generic brand but it didn't work well for her. [facility] will not have med for a month. She wants to know if Dr. Linford Arnold can change the med.

## 2022-06-25 MED ORDER — PAROXETINE HCL ER 12.5 MG PO TB24: 12.5000 mg | ORAL_TABLET | Freq: Every day | ORAL | 1 refills | Status: DC

## 2022-06-25 NOTE — Telephone Encounter (Signed)
She states she has tried the 25 mg in the past and she was sleeping all the time. She states she will try the generic 12.5 mg. The pharmacy doesn't have the Brand in stock. [facility]

## 2022-06-25 NOTE — Telephone Encounter (Signed)
Meds ordered this encounter  Medications   PARoxetine (PAXIL CR) 12.5 MG 24 hr tablet    Sig: Take 1 tablet (12.5 mg total) by mouth daily.    Dispense:  90 tablet    Refill:  1    OK for generic

## 2022-06-27 NOTE — Telephone Encounter (Signed)
Patient advised.

## 2022-06-28 ENCOUNTER — Encounter: Payer: Self-pay | Admitting: Family Medicine

## 2022-06-28 DIAGNOSIS — I700 Atherosclerosis of aorta: Secondary | ICD-10-CM | POA: Diagnosis not present

## 2022-06-28 DIAGNOSIS — E038 Other specified hypothyroidism: Secondary | ICD-10-CM | POA: Diagnosis not present

## 2022-06-28 DIAGNOSIS — R7301 Impaired fasting glucose: Secondary | ICD-10-CM | POA: Diagnosis not present

## 2022-06-29 LAB — LIPID PANEL
Cholesterol: 203 mg/dL — ABNORMAL HIGH (ref <200)
HDL: 60 mg/dL (ref >=50)
LDL Cholesterol (Calc): 115 mg/dL (calc) — ABNORMAL HIGH
Non-HDL Cholesterol (Calc): 143 mg/dL (calc) — ABNORMAL HIGH (ref <130)
Total CHOL/HDL Ratio: 3.4 (calc) (ref <5.0)
Triglycerides: 167 mg/dL — ABNORMAL HIGH (ref <150)

## 2022-06-29 LAB — HEMOGLOBIN A1C
Hgb A1c MFr Bld: 6.3 % of total Hgb — ABNORMAL HIGH (ref <5.7)
Mean Plasma Glucose: 134 mg/dL
eAG (mmol/L): 7.4 mmol/L

## 2022-06-29 LAB — TSH: TSH: 0.66 mIU/L (ref 0.40–4.50)

## 2022-06-29 NOTE — Progress Notes (Signed)
A1c is up just a little bit from last time it was 6.3.  Overall though, that still under 6.5 which is great.  Continue to work on [employer] and regular exercise.  LDL cholesterol looks a little bit better this time.  Thyroid looks good as well.

## 2022-06-30 ENCOUNTER — Telehealth: Payer: Self-pay

## 2022-06-30 NOTE — Telephone Encounter (Signed)
Initiated Prior authorization WUJ:[id number] 5% patches Via: Covermymeds Case/Key:[id number] Status: Pending as of 06/30/22 Reason: Notified Pt via: Mychart

## 2022-07-03 ENCOUNTER — Ambulatory Visit (INDEPENDENT_AMBULATORY_CARE_PROVIDER_SITE_OTHER): Payer: Medicare Other | Admitting: Family Medicine

## 2022-07-03 ENCOUNTER — Encounter: Payer: Self-pay | Admitting: Family Medicine

## 2022-07-03 VITALS — BP 104/50 | HR 71 | Temp 98.5°F | Ht 61.0 in | Wt 177.0 lb

## 2022-07-03 DIAGNOSIS — R509 Fever, unspecified: Secondary | ICD-10-CM

## 2022-07-03 DIAGNOSIS — N3000 Acute cystitis without hematuria: Secondary | ICD-10-CM | POA: Diagnosis not present

## 2022-07-03 DIAGNOSIS — R3915 Urgency of urination: Secondary | ICD-10-CM | POA: Diagnosis not present

## 2022-07-03 DIAGNOSIS — I10 Essential (primary) hypertension: Secondary | ICD-10-CM

## 2022-07-03 DIAGNOSIS — R051 Acute cough: Secondary | ICD-10-CM | POA: Diagnosis not present

## 2022-07-03 LAB — POCT INFLUENZA A/B
Influenza A, POC: NEGATIVE
Influenza B, POC: NEGATIVE

## 2022-07-03 LAB — POCT URINALYSIS DIP (CLINITEK)
Bilirubin, UA: NEGATIVE
Blood, UA: NEGATIVE
Glucose, UA: NEGATIVE mg/dL
Ketones, POC UA: NEGATIVE mg/dL
Nitrite, UA: POSITIVE — AB
POC PROTEIN,UA: NEGATIVE
Spec Grav, UA: 1.015 (ref 1.010–1.025)
Urobilinogen, UA: 0.2 E.U./dL
pH, UA: 6 (ref 5.0–8.0)

## 2022-07-03 MED ORDER — AMOXICILLIN-POT CLAVULANATE 875-125 MG PO TABS: 1.0000 | ORAL_TABLET | Freq: Two times a day (BID) | ORAL | 0 refills | Status: DC

## 2022-07-03 NOTE — Assessment & Plan Note (Signed)
Blood pressure looks great today. 

## 2022-07-03 NOTE — Progress Notes (Signed)
Established Patient Office Visit  Subjective   Patient ID: [name], female    DOB: 1945-08-17  Age: 77 y.o. MRN: [id number]  Chief Complaint  Patient presents with   Cough   Fever   Urinary Frequency    HPI  Cough, fever and ST x 6 days.  Highest fever was over the weekend was 102.9.  Her sore throat was extremely severe at first now it is less so.  But now she is losing her voice.  Last fever was 100.2 this AM.  Bilat ear pain.  Also c/o of urinary urgency. No blood in the urine.    Hypertension- Pt denies chest pain, SOB, dizziness, or heart palpitations.  Taking meds as directed w/o problems.  Denies medication side effects.       ROS    Objective:     BP (!) 104/50   Pulse 71   Temp 98.5 F (36.9 C)   Ht 5\' 1"  (1.549 m)   Wt 177 lb (80.3 kg)   SpO2 95%   BMI 33.44 kg/m    Physical Exam Constitutional:      Appearance: She is well-developed.  HENT:     Head: Normocephalic and atraumatic.     Right Ear: External ear normal.     Left Ear: External ear normal.     Nose: Nose normal.  Eyes:     Conjunctiva/sclera: Conjunctivae normal.     Pupils: Pupils are equal, round, and reactive to light.  Neck:     Thyroid: No thyromegaly.  Cardiovascular:     Rate and Rhythm: Normal rate and regular rhythm.     Heart sounds: Normal heart sounds.  Pulmonary:     Effort: Pulmonary effort is normal.     Breath sounds: Normal breath sounds. No wheezing.  Musculoskeletal:     Cervical back: Neck supple.  Lymphadenopathy:     Cervical: No cervical adenopathy.  Skin:    General: Skin is warm and dry.  Neurological:     Mental Status: She is alert and oriented to person, place, and time.      Results for orders placed or performed in visit on 07/03/22  POCT URINALYSIS DIP (CLINITEK)  Result Value Ref Range   Color, UA yellow yellow   Clarity, UA cloudy (A) clear   Glucose, UA negative negative mg/dL   Bilirubin, UA negative negative   Ketones, POC  UA negative negative mg/dL   Spec Grav, UA 4.098 1.191 - 1.025   Blood, UA negative negative   pH, UA 6.0 5.0 - 8.0   POC PROTEIN,UA negative negative, trace   Urobilinogen, UA 0.2 0.2 or 1.0 E.U./dL   Nitrite, UA Positive (A) Negative   Leukocytes, UA Small (1+) (A) Negative  POCT Influenza A/B  Result Value Ref Range   Influenza A, POC Negative Negative   Influenza B, POC Negative Negative      The 10-year ASCVD risk score (Arnett DK, et al., 2019) is: 34.8%    Assessment & Plan:   Problem List Items Addressed This Visit       Cardiovascular and Mediastinum   Essential hypertension, benign - Primary    Blood pressure looks great today.      Other Visit Diagnoses     Fever, unspecified fever cause       Relevant Orders   POCT Influenza A/B (Completed)   Acute cough       Relevant Orders   POCT Influenza A/B (Completed)  Urinary urgency       Relevant Orders   POCT URINALYSIS DIP (CLINITEK) (Completed)   Urine Culture   Acute cystitis without hematuria       Relevant Orders   Urine Culture      Upper respiratory infection-negative for COVID today but she is still running a low-grade fever which is concerning to medical head and treat her for sinusitis with persistent fever.  UTI-urinalysis positive for nitrites and leukocytes.  Will send for culture.  Will go and treat with Augmentin.  Call if any problems concerns or not improving.  Call back if developing new or worsening symptoms.  Continue to stay well-hydrated.   No follow-ups on file.    Nani Gasser, MD

## 2022-07-05 NOTE — Progress Notes (Signed)
Preliminary report is back so awaiting the sensitivities for the culture.

## 2022-07-06 ENCOUNTER — Ambulatory Visit (INDEPENDENT_AMBULATORY_CARE_PROVIDER_SITE_OTHER): Payer: Medicare Other

## 2022-07-06 ENCOUNTER — Telehealth: Payer: Self-pay | Admitting: Family Medicine

## 2022-07-06 DIAGNOSIS — R509 Fever, unspecified: Secondary | ICD-10-CM | POA: Diagnosis not present

## 2022-07-06 DIAGNOSIS — R051 Acute cough: Secondary | ICD-10-CM

## 2022-07-06 DIAGNOSIS — R059 Cough, unspecified: Secondary | ICD-10-CM | POA: Diagnosis not present

## 2022-07-06 LAB — CBC WITH DIFFERENTIAL/PLATELET
Absolute Monocytes: 1428 cells/uL — ABNORMAL HIGH (ref 200–950)
Basophils Absolute: 95 cells/uL (ref 0–200)
Basophils Relative: 0.7 %
Eosinophils Absolute: 286 cells/uL (ref 15–500)
Eosinophils Relative: 2.1 %
HCT: 36.7 % (ref 35.0–45.0)
Hemoglobin: 12.2 g/dL (ref 11.7–15.5)
Lymphs Abs: 1890 cells/uL (ref 850–3900)
MCH: 29.3 pg (ref 27.0–33.0)
MCHC: 33.2 g/dL (ref 32.0–36.0)
MCV: 88 fL (ref 80.0–100.0)
MPV: 11.6 fL (ref 7.5–12.5)
Monocytes Relative: 10.5 %
Neutro Abs: 9901 cells/uL — ABNORMAL HIGH (ref 1500–7800)
Neutrophils Relative %: 72.8 %
Platelets: 347 10*3/uL (ref 140–400)
RBC: 4.17 10*6/uL (ref 3.80–5.10)
RDW: 12.5 % (ref 11.0–15.0)
Total Lymphocyte: 13.9 %
WBC: 13.6 10*3/uL — ABNORMAL HIGH (ref 3.8–10.8)

## 2022-07-06 LAB — URINE CULTURE
MICRO NUMBER:: 14984563
SPECIMEN QUALITY:: ADEQUATE

## 2022-07-06 NOTE — Telephone Encounter (Signed)
Spoke to patient. Informed as recommended . She will come today for Cxray and CBC.

## 2022-07-06 NOTE — Telephone Encounter (Signed)
She come by for chest x-ray and a CBC?  We did get her urine culture back and it did come back positive but the antibiotic that I put her on should have worked.  So the fever is more likely coming from the cough.  So lets go ahead and get a chest x-ray.  Orders already placed.  Fevers is still high like they were when I saw her or they a little bit better and low-grade?

## 2022-07-06 NOTE — Progress Notes (Signed)
Carlise, the culture was positive for bacteria called Klebsiella.  Augmentin that we use to help treat your sinuses will also work to treat this particular back to the area.  So hopefully you are noticing improvement and feeling much better.  Please let us know if you are still experiencing fevers.

## 2022-07-06 NOTE — Telephone Encounter (Signed)
Patient called she is coughing and can't stop and has a fever

## 2022-07-09 DIAGNOSIS — I495 Sick sinus syndrome: Secondary | ICD-10-CM | POA: Diagnosis not present

## 2022-07-10 ENCOUNTER — Telehealth: Payer: Self-pay | Admitting: Family Medicine

## 2022-07-10 NOTE — Telephone Encounter (Signed)
Patient called has no fever but still coughing up green phlegm on last day of antibiotics and waiting to hear about her recent x-ray please advise

## 2022-07-10 NOTE — Progress Notes (Signed)
Call patient and see how she is feeling this morning.

## 2022-07-10 NOTE — Telephone Encounter (Signed)
Unfortunately the chest x-ray results are not back yet.  Will have to call the imaging department and see if they can push those over.

## 2022-07-11 ENCOUNTER — Ambulatory Visit: Payer: Medicare Other | Attending: Cardiology

## 2022-07-11 ENCOUNTER — Encounter (HOSPITAL_COMMUNITY): Payer: Self-pay

## 2022-07-11 ENCOUNTER — Encounter: Payer: Self-pay | Admitting: Family

## 2022-07-11 DIAGNOSIS — Z01810 Encounter for preprocedural cardiovascular examination: Secondary | ICD-10-CM

## 2022-07-11 NOTE — Progress Notes (Signed)
Virtual Visit via Telephone Note   Because of [name] co-morbid illnesses, she is at least at moderate risk for complications without adequate follow up.  This format is felt to be most appropriate for this patient at this time.  The patient did not have access to video technology/had technical difficulties with video requiring transitioning to audio format only (telephone).  All issues noted in this document were discussed and addressed.  No physical exam could be performed with this format.  Please refer to the patient's chart for her consent to telehealth for [facility].  Evaluation Performed:  Preoperative cardiovascular risk assessment _____________   Date:  07/11/2022   Patient ID:  [name], DOB 01-May-1945, MRN [id number] Patient Location:  Home Provider location:   Office  Primary Care Provider:  Agapito Games, MD Primary Cardiologist:  Olga Millers, MD  Chief Complaint / Patient Profile   77 y.o. y/o female with a h/o DM, HTN, depression who is pending right total knee arthroplasty and presents today for telephonic preoperative cardiovascular risk assessment.  History of Present Illness    [name] is a 77 y.o. female who presents via audio/video conferencing for a telehealth visit today.  Pt was last seen in cardiology clinic on 11/24/2021 by Edd Fabian, NP for surgical clearance.  At that time Britiny Fraijo was doing well and reported using stationary bike and working in her yard.  Blood pressure was stable on current medications..  The patient is now pending procedure as outlined above. Since her last visit, she has been doing well from a card perspective with no new complaints since her visit in October.  She has been taking it easy but does still complete her ADLs and works in her yard from time to time.    She denies chest pain, shortness of breath, lower extremity edema, fatigue, palpitations, melena, hematuria,  hemoptysis, diaphoresis, weakness, presyncope, syncope, orthopnea, and PND.   No medications requested  Past Medical History    Past Medical History:  Diagnosis Date   Allergic rhinitis, cause unspecified    Anxiety    Asthma    Benign paroxysmal positional vertigo    Cancer (HCC)    basal cell carcinoma per right side of nostril   CHF (congestive heart failure) (HCC) 11/09/2016   had 2+ edema in legs with anxiety and SOB   Complication of anesthesia    has 4  degenerative discs in neck.   Depressive disorder, not elsewhere classified    Esophageal reflux    History of hiatal hernia    Hypertension    Localized osteoarthrosis not specified whether primary or secondary, lower leg    Other and unspecified hyperlipidemia    Other diseases of vocal cords    Peptic ulcer, unspecified site, unspecified as acute or chronic, without mention of hemorrhage, perforation, or obstruction    Pneumonia    bilat pneumonia 1987   Pre-diabetes    no meds,just diet controll   TMJ (temporomandibular joint disorder)    Unspecified arthropathy, hand    Unspecified asthma(493.90)    triggered with a virus    Unspecified hypothyroidism    Past Surgical History:  Procedure Laterality Date   BREAST BIOPSY  09/07/2011   High Risk Lesion   BREAST EXCISIONAL BIOPSY Left    BREAST SURGERY     LUMPECTOMY / LEFT 10/12/2011   KNEE ARTHROSCOPY     bilat    NASAL SINUS SURGERY     ROTATOR  CUFF REPAIR     right    thumb surgery      left hand 45 years ago    TOTAL HIP ARTHROPLASTY Right 08/10/2014   Procedure: RIGHT TOTAL HIP ARTHROPLASTY ANTERIOR APPROACH;  Surgeon: Durene Romans, MD;  Location: [facility];  Service: Orthopedics;  Laterality: Right;   TOTAL HIP ARTHROPLASTY Left 08/22/2021   Procedure: TOTAL HIP ARTHROPLASTY ANTERIOR APPROACH;  Surgeon: Durene Romans, MD;  Location: [facility];  Service: Orthopedics;  Laterality: Left;   TOTAL KNEE ARTHROPLASTY Left 08/26/2018   Procedure: TOTAL KNEE  ARTHROPLASTY;  Surgeon: Durene Romans, MD;  Location: [facility];  Service: Orthopedics;  Laterality: Left;  70 mins    Allergies  Allergies  Allergen Reactions   Moxifloxacin Other (See Comments)    REACTION: confusion, dizziness, paranoia   Astelin [Azelastine Hcl] Other (See Comments)    Headache   Celebrex [Celecoxib] Other (See Comments)    Stomach issues   Lisinopril Cough   Methotrexate Derivatives Other (See Comments)   Morphine Nausea Only    Home Medications    Prior to Admission medications   Medication Sig Start Date End Date Taking? Authorizing Provider  albuterol (VENTOLIN HFA) 108 (90 Base) MCG/ACT inhaler INHALE 2 PUFFS INTO THE LUNGS EVERY 6 HOURS AS NEEDED FOR SHORTNESS OF BREATH 12/04/21   Oretha Milch, MD  amoxicillin-clavulanate (AUGMENTIN) 875-125 MG tablet Take 1 tablet by mouth 2 (two) times daily. 07/03/22   Agapito Games, MD  Ascorbic Acid (VITAMIN C WITH ROSE HIPS) 1000 MG tablet Take 1,000 mg by mouth daily.    [provider]  buPROPion (WELLBUTRIN XL) 300 MG 24 hr tablet Take 300 mg by mouth daily. 08/25/20   [provider]  Calcium Carb-Cholecalciferol (CALCIUM 600+D3 PO) Take 1 tablet by mouth daily.    [provider]  Carboxymethylcell-Hypromellose (GENTEAL OP) Place 1-2 drops into both eyes 3 (three) times daily as needed (for dry eyes).    [provider]  Cholecalciferol (VITAMIN D3) 50 MCG (2000 UT) capsule Take 2,000 Units by mouth daily.    [provider]  clonazePAM (KLONOPIN) 0.5 MG tablet Take 0.125 mg by mouth daily as needed for anxiety.    [provider]  famotidine (PEPCID) 20 MG tablet Take 20 mg by mouth at bedtime.     [provider]  fexofenadine (ALLEGRA) 180 MG tablet Take 180 mg by mouth at bedtime.    [provider]  folic acid (FOLVITE) 1 MG tablet Take 1 mg by mouth daily.    [provider]  Ginkgo Biloba (GNP GINGKO BILOBA EXTRACT PO)  Take 1 tablet by mouth 3 (three) times daily with meals.    [provider]  hydrochlorothiazide (HYDRODIURIL) 25 MG tablet Take 1 tablet (25 mg total) by mouth daily. 06/05/22   Agapito Games, MD  Lido-Menthol-Methyl Sal-Camph (CBD KINGS EX) Apply 1 Application topically in the morning and at bedtime.    [provider]  lidocaine (LIDODERM) 5 % Place 1 patch onto the skin daily. Remove & Discard patch within 12 hours or as directed by MD 06/05/22   Agapito Games, MD  losartan (COZAAR) 50 MG tablet TAKE 1 TABLET BY MOUTH EVERY DAY 10/20/21   Agapito Games, MD  Magnesium 100 MG TABS Take 100 mg by mouth 2 (two) times a day.    [provider]  meloxicam (MOBIC) 15 MG tablet TAKE 1 TABLET BY MOUTH EVERY DAY IN THE MORNING  06/08/22   Agapito Games, MD  metoprolol succinate (TOPROL-XL) 25 MG 24 hr tablet Take 1 tablet by mouth daily.    [provider]  metroNIDAZOLE (METROGEL) 1 % gel Apply topically daily. 09/22/18   Agapito Games, MD  PARoxetine (PAXIL CR) 12.5 MG 24 hr tablet Take 1 tablet (12.5 mg total) by mouth daily. 06/25/22   Agapito Games, MD  Polyethyl Glycol-Propyl Glycol (SYSTANE ULTRA PF OP) Place 1 drop into both eyes daily as needed (dry eyes).    [provider]  polyethylene glycol (MIRALAX / GLYCOLAX) 17 g packet Take 17 g by mouth daily as needed for mild constipation. 08/23/21   Cassandria Anger, PA-C  SYMBICORT 80-4.5 MCG/ACT inhaler TAKE 2 PUFFS BY MOUTH TWICE A DAY Patient taking differently: 2 puffs daily. 08/08/21   Oretha Milch, MD  SYNTHROID 75 MCG tablet Take 1 tablet (75 mcg total) by mouth daily before breakfast. 03/15/22   Agapito Games, MD  TART CHERRY PO Take 2 capsules by mouth daily.    [provider]    Physical Exam    Vital Signs:  Dyanni Battistini does not have vital signs available for review today.  Given telephonic nature of communication, physical exam  is limited. AAOx3. NAD. Normal affect.  Speech and respirations are unlabored.  Accessory Clinical Findings    None  Assessment & Plan    1.  Preoperative Cardiovascular Risk Assessment:  Patient's RCRI is 0.9%  The patient affirms she has been doing well without any new cardiac symptoms. They are able to achieve 5 METS without cardiac limitations. Therefore, based on ACC/AHA guidelines, the patient would be at acceptable risk for the planned procedure without further cardiovascular testing. The patient was advised that if she develops new symptoms prior to surgery to contact our office to arrange for a follow-up visit, and she verbalized understanding.   The patient was advised that if she develops new symptoms prior to surgery to contact our office to arrange for a follow-up visit, and she verbalized understanding.  None Requested  A copy of this note will be routed to requesting surgeon.  Time:   Today, I have spent 6 minutes with the patient with telehealth technology discussing medical history, symptoms, and management plan.     Napoleon Form, Leodis Rains, NP  07/11/2022, 7:25 AM

## 2022-07-11 NOTE — Telephone Encounter (Signed)
X-ray results are in chart now. Dr. Linford Arnold has reviewed them and responded : Hi [name], great news!  Chest x-ray looks good no sign of pneumonia which is really reassuring.  Unfortunately I just think this is taking a while to really improve and get better.  Do you feel like your urinary symptoms are resolved?

## 2022-07-11 NOTE — Progress Notes (Signed)
Hi [name], great news!  Chest x-ray looks good no sign of pneumonia which is really reassuring.  Unfortunately I just think this is taking a while to really improve and get better.  Do you feel like your urinary symptoms are resolved?

## 2022-07-11 NOTE — Telephone Encounter (Signed)
Patient informed. 

## 2022-07-16 ENCOUNTER — Other Ambulatory Visit: Payer: Self-pay | Admitting: Adult Health

## 2022-07-17 DIAGNOSIS — F410 Panic disorder [episodic paroxysmal anxiety] without agoraphobia: Secondary | ICD-10-CM | POA: Diagnosis not present

## 2022-07-17 DIAGNOSIS — F902 Attention-deficit hyperactivity disorder, combined type: Secondary | ICD-10-CM | POA: Diagnosis not present

## 2022-07-17 DIAGNOSIS — F422 Mixed obsessional thoughts and acts: Secondary | ICD-10-CM | POA: Diagnosis not present

## 2022-07-18 ENCOUNTER — Other Ambulatory Visit (HOSPITAL_COMMUNITY): Payer: Medicare Other

## 2022-07-18 DIAGNOSIS — M1711 Unilateral primary osteoarthritis, right knee: Secondary | ICD-10-CM | POA: Diagnosis not present

## 2022-07-18 DIAGNOSIS — M25561 Pain in right knee: Secondary | ICD-10-CM | POA: Diagnosis not present

## 2022-07-20 NOTE — Progress Notes (Signed)
COVID Vaccine Completed: yes  Date of COVID positive in last 21 days:no  PCP - Nani Gasser, MD Cardiologist -  Olga Millers, MD and Dyane Dustman in [place]  Pulmonologist- Cyril Mourning, MD  Cardiac clearance by Robin Searing, NP 07/11/22 in Epic  Chest x-ray - 07/06/22 Epic EKG - 04/02/22 [facility] Stress Test - 10/18/20 Epic ECHO - 10/08/17 Epic Cardiac Cath - 2023 Pacemaker/ICD device last checked: 07/12/22 [facility] Spinal Cord Stimulator: no  Bowel Prep - no  Sleep Study - yes, negative per pt CPAP -   Fasting Blood Sugar - preDM Checks Blood Sugar no checks at home  Last dose of GLP1 agonist-  N/A GLP1 instructions:  N/A   Last dose of SGLT-2 inhibitors-  N/A SGLT-2 instructions: N/A   Blood Thinner Instructions:  n/a Aspirin Instructions: Last Dose:  Activity level: Can go up a flight of stairs and perform activities of daily living without stopping and without symptoms of chest pain. SOB due to hx of asthma   Anesthesia review: HTN, aortic atherosclerosis, asthma, fatty liver, CHF, SSS, sleeps in incline position due to GERD  Patient denies shortness of breath, fever, cough and chest pain at PAT appointment  Patient verbalized understanding of instructions that were given to them at the PAT appointment. Patient was also instructed that they will need to review over the PAT instructions again at home before surgery.

## 2022-07-23 ENCOUNTER — Encounter: Payer: Self-pay | Admitting: Family

## 2022-07-23 ENCOUNTER — Encounter (HOSPITAL_COMMUNITY)
Admission: RE | Admit: 2022-07-23 | Discharge: 2022-07-23 | Disposition: A | Discharge status: Home / Self Care | Payer: Medicare Other | Source: Ambulatory Visit | Attending: Orthopedic Surgery | Admitting: Orthopedic Surgery

## 2022-07-23 ENCOUNTER — Encounter (HOSPITAL_COMMUNITY): Payer: Self-pay

## 2022-07-23 ENCOUNTER — Other Ambulatory Visit: Payer: Self-pay

## 2022-07-23 VITALS — BP 118/80 | HR 87 | Temp 98.2°F | Resp 14 | Ht 64.0 in | Wt 176.0 lb

## 2022-07-23 DIAGNOSIS — Z87891 Personal history of nicotine dependence: Secondary | ICD-10-CM | POA: Diagnosis not present

## 2022-07-23 DIAGNOSIS — K219 Gastro-esophageal reflux disease without esophagitis: Secondary | ICD-10-CM | POA: Insufficient documentation

## 2022-07-23 DIAGNOSIS — Z950 Presence of cardiac pacemaker: Secondary | ICD-10-CM | POA: Diagnosis not present

## 2022-07-23 DIAGNOSIS — I10 Essential (primary) hypertension: Secondary | ICD-10-CM

## 2022-07-23 DIAGNOSIS — I509 Heart failure, unspecified: Secondary | ICD-10-CM | POA: Diagnosis not present

## 2022-07-23 DIAGNOSIS — Z01812 Encounter for preprocedural laboratory examination: Secondary | ICD-10-CM | POA: Insufficient documentation

## 2022-07-23 DIAGNOSIS — I110 Hypertensive heart disease with heart failure: Secondary | ICD-10-CM | POA: Insufficient documentation

## 2022-07-23 DIAGNOSIS — M1711 Unilateral primary osteoarthritis, right knee: Secondary | ICD-10-CM | POA: Insufficient documentation

## 2022-07-23 DIAGNOSIS — R7303 Prediabetes: Secondary | ICD-10-CM

## 2022-07-23 DIAGNOSIS — Z01818 Encounter for other preprocedural examination: Secondary | ICD-10-CM

## 2022-07-23 HISTORY — DX: Anemia, unspecified: D64.9

## 2022-07-23 LAB — BASIC METABOLIC PANEL
Anion gap: 7 (ref 5–15)
BUN: 22 mg/dL (ref 8–23)
CO2: 27 mmol/L (ref 22–32)
Calcium: 9.1 mg/dL (ref 8.9–10.3)
Chloride: 103 mmol/L (ref 98–111)
Creatinine, Ser: 0.72 mg/dL (ref 0.44–1.00)
GFR, Estimated: >60 mL/min (ref >60)
Glucose, Bld: 100 mg/dL — ABNORMAL HIGH (ref 70–99)
Potassium: 4.1 mmol/L (ref 3.5–5.1)
Sodium: 137 mmol/L (ref 135–145)

## 2022-07-23 LAB — SURGICAL PCR SCREEN
MRSA, PCR: NEGATIVE
Staphylococcus aureus: NEGATIVE

## 2022-07-23 NOTE — Patient Instructions (Addendum)
SURGICAL WAITING ROOM VISITATION  Patients having surgery or a procedure may have no more than 2 support people in the waiting area - these visitors may rotate.    Children under the age of 69 must have an adult with them who is not the patient.  Due to an increase in RSV and influenza rates and associated hospitalizations, children ages 40 and under may not visit patients in [facility] hospitals.  If the patient needs to stay at the hospital during part of their recovery, the visitor guidelines for inpatient rooms apply. Pre-op nurse will coordinate an appropriate time for 1 support person to accompany patient in pre-op.  This support person may not rotate.    Please refer to the [facility] website for the visitor guidelines for Inpatients (after your surgery is over and you are in a regular room).    Your procedure is scheduled on: 07/31/22   Report to [facility] Main Entrance    Report to admitting at 11:50 AM   Call this number if you have problems the morning of surgery [phone]   Do not eat food :After Midnight.   After Midnight you may have the following liquids until 11:20 AM DAY OF SURGERY  Water Non-Citrus Juices (without pulp, NO RED-Apple, White grape, White cranberry) Black Coffee (NO MILK/CREAM OR CREAMERS, sugar ok)  Clear Tea (NO MILK/CREAM OR CREAMERS, sugar ok) regular and decaf                             Plain Jell-O (NO RED)                                           Fruit ices (not with fruit pulp, NO RED)                                     Popsicles (NO RED)                                                               Sports drinks like Gatorade (NO RED)                 The day of surgery:  Drink ONE (1) Pre-Surgery G2 at 11:20 AM the morning of surgery. Drink in one sitting. Do not sip.  This drink was given to you during your hospital  pre-op appointment visit. Nothing else to drink after completing the  Pre-Surgery G2.          If you  have questions, please contact your surgeon's office.   FOLLOW BOWEL PREP AND ANY ADDITIONAL PRE OP INSTRUCTIONS YOU RECEIVED FROM YOUR SURGEON'S OFFICE!!!     Oral Hygiene is also important to reduce your risk of infection.                                    Remember - BRUSH YOUR TEETH THE MORNING OF SURGERY WITH YOUR REGULAR TOOTHPASTE  DENTURES WILL BE REMOVED  PRIOR TO SURGERY PLEASE DO NOT APPLY "Poly grip" OR ADHESIVES!!!   Take these medicines the morning of surgery with A SIP OF WATER: Inhalers, Wellbutrin, Clonazepam, Paxil, Synthroid   How to Manage Your Diabetes Before and After Surgery  Why is it important to control my blood sugar before and after surgery? Improving blood sugar levels before and after surgery helps healing and can limit problems. A way of improving blood sugar control is eating a healthy diet by:  Eating less sugar and carbohydrates  Increasing activity/exercise  Talking with your doctor about reaching your blood sugar goals High blood sugars (greater than 180 mg/dL) can raise your risk of infections and slow your recovery, so you will need to focus on controlling your diabetes during the weeks before surgery. Make sure that the doctor who takes care of your diabetes knows about your planned surgery including the date and location.  How do I manage my blood sugar before surgery? Check your blood sugar at least 4 times a day, starting 2 days before surgery, to make sure that the level is not too high or low. Check your blood sugar the morning of your surgery when you wake up and every 2 hours until you get to the Short Stay unit. If your blood sugar is less than 70 mg/dL, you will need to treat for low blood sugar: Do not take insulin. Treat a low blood sugar (less than 70 mg/dL) with  cup of clear juice (cranberry or apple), 4 glucose tablets, OR glucose gel. Recheck blood sugar in 15 minutes after treatment (to make sure it is greater than 70 mg/dL). If  your blood sugar is not greater than 70 mg/dL on recheck, call [phone] for further instructions. Report your blood sugar to the short stay nurse when you get to Short Stay.  If you are admitted to the hospital after surgery: Your blood sugar will be checked by the staff and you will probably be given insulin after surgery (instead of oral diabetes medicines) to make sure you have good blood sugar levels. The goal for blood sugar control after surgery is 80-180 mg/dL.  Reviewed and Endorsed by [facility] Patient Education Committee, August 2015                              You may not have any metal on your body including hair pins, jewelry, and body piercing             Do not wear make-up, lotions, powders, perfumes, or deodorant  Do not wear nail polish including gel and S&S, artificial/acrylic nails, or any other type of covering on natural nails including finger and toenails. If you have artificial nails, gel coating, etc. that needs to be removed by a nail salon please have this removed prior to surgery or surgery may need to be canceled/ delayed if the surgeon/ anesthesia feels like they are unable to be safely monitored.   Do not shave  48 hours prior to surgery.    Do not bring valuables to the hospital. [place] IS NOT             RESPONSIBLE   FOR VALUABLES.   Contacts, glasses, dentures or bridgework may not be worn into surgery.   Bring small overnight bag day of surgery.   DO NOT BRING YOUR HOME MEDICATIONS TO THE HOSPITAL. PHARMACY WILL DISPENSE MEDICATIONS LISTED ON YOUR MEDICATION LIST TO YOU  DURING YOUR ADMISSION IN THE HOSPITAL!   Special Instructions: Bring a copy of your healthcare power of attorney and living will documents the day of surgery if you haven't scanned them before.              Please read over the following fact sheets you were given: IF YOU HAVE QUESTIONS ABOUT YOUR PRE-OP INSTRUCTIONS PLEASE CALL [phone]   If you received a  COVID test during your pre-op visit  it is requested that you wear a mask when out in public, stay away from anyone that may not be feeling well and notify your surgeon if you develop symptoms. If you test positive for Covid or have been in contact with anyone that has tested positive in the last 10 days please notify you surgeon.      Pre-operative 5 CHG Bath Instructions   You can play a key role in reducing the risk of infection after surgery. Your skin needs to be as free of germs as possible. You can reduce the number of germs on your skin by washing with CHG (chlorhexidine gluconate) soap before surgery. CHG is an antiseptic soap that kills germs and continues to kill germs even after washing.   DO NOT use if you have an allergy to chlorhexidine/CHG or antibacterial soaps. If your skin becomes reddened or irritated, stop using the CHG and notify one of our RNs at [phone].   Please shower with the CHG soap starting 4 days before surgery using the following schedule:     Please keep in mind the following:  DO NOT shave, including legs and underarms, starting the day of your first shower.   You may shave your face at any point before/day of surgery.  Place clean sheets on your bed the day you start using CHG soap. Use a clean washcloth (not used since being washed) for each shower. DO NOT sleep with pets once you start using the CHG.   CHG Shower Instructions:  If you choose to wash your hair and private area, wash first with your normal shampoo/soap.  After you use shampoo/soap, rinse your hair and body thoroughly to remove shampoo/soap residue.  Turn the water OFF and apply about 3 tablespoons (45 ml) of CHG soap to a CLEAN washcloth.  Apply CHG soap ONLY FROM YOUR NECK DOWN TO YOUR TOES (washing for 3-5 minutes)  DO NOT use CHG soap on face, private areas, open wounds, or sores.  Pay special attention to the area where your surgery is being performed.  If you are having back  surgery, having someone wash your back for you may be helpful. Wait 2 minutes after CHG soap is applied, then you may rinse off the CHG soap.  Pat dry with a clean towel  Put on clean clothes/pajamas   If you choose to wear lotion, please use ONLY the CHG-compatible lotions on the back of this paper.     Additional instructions for the day of surgery: DO NOT APPLY any lotions, deodorants, cologne, or perfumes.   Put on clean/comfortable clothes.  Brush your teeth.  Ask your nurse before applying any prescription medications to the skin.      CHG Compatible Lotions   Aveeno Moisturizing lotion  Cetaphil Moisturizing Cream  Cetaphil Moisturizing Lotion  Clairol Herbal Essence Moisturizing Lotion, Dry Skin  Clairol Herbal Essence Moisturizing Lotion, Extra Dry Skin  Clairol Herbal Essence Moisturizing Lotion, Normal Skin  Curel Age Defying Therapeutic Moisturizing Lotion with Alpha Hydroxy  Curel Extreme Care Body Lotion  Curel Soothing Hands Moisturizing Hand Lotion  Curel Therapeutic Moisturizing Cream, Fragrance-Free  Curel Therapeutic Moisturizing Lotion, Fragrance-Free  Curel Therapeutic Moisturizing Lotion, Original Formula  Eucerin Daily Replenishing Lotion  Eucerin Dry Skin Therapy Plus Alpha Hydroxy Crme  Eucerin Dry Skin Therapy Plus Alpha Hydroxy Lotion  Eucerin Original Crme  Eucerin Original Lotion  Eucerin Plus Crme Eucerin Plus Lotion  Eucerin TriLipid Replenishing Lotion  Keri Anti-Bacterial Hand Lotion  Keri Deep Conditioning Original Lotion Dry Skin Formula Softly Scented  Keri Deep Conditioning Original Lotion, Fragrance Free Sensitive Skin Formula  Keri Lotion Fast Absorbing Fragrance Free Sensitive Skin Formula  Keri Lotion Fast Absorbing Softly Scented Dry Skin Formula  Keri Original Lotion  Keri Skin Renewal Lotion Keri Silky Smooth Lotion  Keri Silky Smooth Sensitive Skin Lotion  Nivea Body Creamy Conditioning Oil  Nivea Body Extra Enriched  Lotion  Nivea Body Original Lotion  Nivea Body Sheer Moisturizing Lotion Nivea Crme  Nivea Skin Firming Lotion  NutraDerm 30 Skin Lotion  NutraDerm Skin Lotion  NutraDerm Therapeutic Skin Cream  NutraDerm Therapeutic Skin Lotion  ProShield Protective Hand Cream  Provon moisturizing lotion   Incentive Spirometer  An incentive spirometer is a tool that can help keep your lungs clear and active. This tool measures how well you are filling your lungs with each breath. Taking long deep breaths may help reverse or decrease the chance of developing breathing (pulmonary) problems (especially infection) following: A long period of time when you are unable to move or be active. BEFORE THE PROCEDURE  If the spirometer includes an indicator to show your best effort, your nurse or respiratory therapist will set it to a desired goal. If possible, sit up straight or lean slightly forward. Try not to slouch. Hold the incentive spirometer in an upright position. INSTRUCTIONS FOR USE  Sit on the edge of your bed if possible, or sit up as far as you can in bed or on a chair. Hold the incentive spirometer in an upright position. Breathe out normally. Place the mouthpiece in your mouth and seal your lips tightly around it. Breathe in slowly and as deeply as possible, raising the piston or the ball toward the top of the column. Hold your breath for 3-5 seconds or for as long as possible. Allow the piston or ball to fall to the bottom of the column. Remove the mouthpiece from your mouth and breathe out normally. Rest for a few seconds and repeat Steps 1 through 7 at least 10 times every 1-2 hours when you are awake. Take your time and take a few normal breaths between deep breaths. The spirometer may include an indicator to show your best effort. Use the indicator as a goal to work toward during each repetition. After each set of 10 deep breaths, practice coughing to be sure your lungs are clear. If you have  an incision (the cut made at the time of surgery), support your incision when coughing by placing a pillow or rolled up towels firmly against it. Once you are able to get out of bed, walk around indoors and cough well. You may stop using the incentive spirometer when instructed by your caregiver.  RISKS AND COMPLICATIONS Take your time so you do not get dizzy or light-headed. If you are in pain, you may need to take or ask for pain medication before doing incentive spirometry. It is harder to take a deep breath if you are having pain. AFTER USE Rest  and breathe slowly and easily. It can be helpful to keep track of a log of your progress. Your caregiver can provide you with a simple table to help with this. If you are using the spirometer at home, follow these instructions: SEEK MEDICAL CARE IF:  You are having difficultly using the spirometer. You have trouble using the spirometer as often as instructed. Your pain medication is not giving enough relief while using the spirometer. You develop fever of 100.5 F (38.1 C) or higher. SEEK IMMEDIATE MEDICAL CARE IF:  You cough up bloody sputum that had not been present before. You develop fever of 102 F (38.9 C) or greater. You develop worsening pain at or near the incision site. MAKE SURE YOU:  Understand these instructions. Will watch your condition. Will get help right away if you are not doing well or get worse. Document Released: 06/11/2006 Document Revised: 04/23/2011 Document Reviewed: 08/12/2006 [facility] Patient Information 2014 [place], [place].   ________________________________________________________________________

## 2022-07-24 NOTE — Progress Notes (Signed)
Anesthesia Chart Review   Case: [id number] Date/Time: 07/31/22 1405   Procedure: TOTAL KNEE ARTHROPLASTY (Right: Knee)   Anesthesia type: Spinal   Pre-op diagnosis: Right knee osteoarthritis   Location: [facility] ROOM 10 / [facility]   Surgeons: Durene Romans, MD       DISCUSSION:77 y.o. former smoker with h/o HTN, GERD, CHF, pacemaker in place (device orders requested, pending), right knee OA scheduled for above procedure 06/30/2022 with Dr. Durene Romans.   Per cardiology preoperative evaluation 07/11/2022, "Patient's RCRI is 0.9%   The patient affirms she has been doing well without any new cardiac symptoms. They are able to achieve 5 METS without cardiac limitations. Therefore, based on ACC/AHA guidelines, the patient would be at acceptable risk for the planned procedure without further cardiovascular testing. The patient was advised that if she develops new symptoms prior to surgery to contact our office to arrange for a follow-up visit, and she verbalized understanding."  Anticipate pt can proceed with planned procedure barring acute status change.   VS: BP 118/80   Pulse 87   Temp 36.8 C (Oral)   Resp 14   Ht 5\' 4"  (1.626 m)   Wt 79.8 kg   SpO2 100%   BMI 30.21 kg/m   PROVIDERS: Agapito Games, MD is PCP   Cardiologist -  Olga Millers, MD  LABS: Labs reviewed: Acceptable for surgery. (all labs ordered are listed, but only abnormal results are displayed)  Labs Reviewed  BASIC METABOLIC PANEL - Abnormal; Notable for the following components:      Result Value   Glucose, Bld 100 (*)    All other components within normal limits  SURGICAL PCR SCREEN     IMAGES:   EKG:   CV: Myocardial Perfusion 10/18/2020   The study is normal. The study is low risk.   No ST deviation was noted.   LV perfusion is normal. There is no evidence of ischemia. There is no evidence of infarction.   Left ventricular function is normal. Nuclear stress EF: 72 %. The left ventricular ejection  fraction is hyperdynamic (>65%). End diastolic cavity size is normal. End systolic cavity size is normal.   Prior study available for comparison from 10/08/2017.   Fixed apical perfusion defect with normal wall motion, consistent with artifact Low risk study  Echo 06/23/2013 - Left ventricle: The cavity size was normal. Wall thickness    was normal. Systolic function was normal. The estimated    ejection fraction was in the range of 60% to 65%. There    was an increased relative contribution of atrial    contraction to ventricular filling.  - Aortic valve: Trivial regurgitation.  - Left atrium: The atrium was mildly dilated.  - Pericardium, extracardiac: A trivial pericardial effusion    was identified.  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   Depressive disorder, not elsewhere classified    Esophageal reflux    History of hiatal hernia    Hypertension    Localized osteoarthrosis not specified whether primary or secondary, lower  leg    Other and unspecified hyperlipidemia    Other diseases of vocal cords    Peptic ulcer, unspecified site, unspecified as acute or chronic, without mention of hemorrhage, perforation, or obstruction    Pneumonia    bilat pneumonia 1987   Pre-diabetes    no meds,just diet controll   TMJ (temporomandibular joint disorder)    Unspecified arthropathy, hand    Unspecified asthma(493.90)    triggered with a virus    Unspecified hypothyroidism     Past Surgical History:  Procedure Laterality Date   BREAST BIOPSY  09/07/2011   High Risk Lesion   BREAST EXCISIONAL BIOPSY Left    BREAST SURGERY      LUMPECTOMY / LEFT 10/12/2011   KNEE ARTHROSCOPY     bilat    NASAL SINUS SURGERY     ROTATOR CUFF REPAIR     right    thumb surgery      left hand 45 years ago    TOTAL HIP ARTHROPLASTY Right 08/10/2014   Procedure: RIGHT TOTAL HIP ARTHROPLASTY ANTERIOR APPROACH;  Surgeon: Durene Romans, MD;  Location: [facility];  Service: Orthopedics;  Laterality: Right;   TOTAL HIP ARTHROPLASTY Left 08/22/2021   Procedure: TOTAL HIP ARTHROPLASTY ANTERIOR APPROACH;  Surgeon: Durene Romans, MD;  Location: [facility];  Service: Orthopedics;  Laterality: Left;   TOTAL KNEE ARTHROPLASTY Left 08/26/2018   Procedure: TOTAL KNEE ARTHROPLASTY;  Surgeon: Durene Romans, MD;  Location: [facility];  Service: Orthopedics;  Laterality: Left;  70 mins    MEDICATIONS:  albuterol (VENTOLIN HFA) 108 (90 Base) MCG/ACT inhaler   amoxicillin-clavulanate (AUGMENTIN) 875-125 MG tablet   Ascorbic Acid (VITAMIN C WITH ROSE HIPS) 1000 MG tablet   buPROPion (WELLBUTRIN XL) 300 MG 24 hr tablet   Calcium Carb-Cholecalciferol (CALCIUM 600+D3 PO)   Cholecalciferol (VITAMIN D3) 50 MCG (2000 UT) capsule   clonazePAM (KLONOPIN) 0.5 MG tablet   famotidine (PEPCID) 20 MG tablet   fexofenadine (ALLEGRA) 180 MG tablet   folic acid (FOLVITE) 1 MG tablet   Ginkgo Biloba (GNP GINGKO BILOBA EXTRACT PO)   Guaifenesin (MUCINEX MAXIMUM STRENGTH) 1200 MG TB12   hydrochlorothiazide (HYDRODIURIL) 25 MG tablet   lidocaine (LIDODERM) 5 %   losartan (COZAAR) 50 MG tablet   Magnesium 100 MG TABS   meloxicam (MOBIC) 15 MG tablet   metoprolol succinate (TOPROL-XL) 25 MG 24 hr tablet   OVER THE COUNTER MEDICATION   PARoxetine (PAXIL CR) 12.5 MG 24 hr tablet   polyethylene glycol (MIRALAX / GLYCOLAX) 17 g packet   Propylene Glycol, PF, (SYSTANE COMPLETE PF) 0.6 % SOLN   SYMBICORT 80-4.5 MCG/ACT inhaler   SYNTHROID 75 MCG tablet   TART CHERRY PO   No current facility-administered medications for this encounter.   Jodell Cipro Ward, PA-C [facility]-Surgical  Testing [phone]

## 2022-07-30 NOTE — H&P (Signed)
TOTAL KNEE ADMISSION H&P  Patient is being admitted for right total knee arthroplasty.  Subjective:  Chief Complaint:right knee pain.  HPI: [name], 77 y.o. female, has a history of pain and functional disability in the right knee due to arthritis and has failed non-surgical conservative treatments for greater than 12 weeks to includeNSAID's and/or analgesics, corticosteriod injections, and activity modification.  Onset of symptoms was gradual, starting 2 years ago with gradually worsening course since that time. The patient noted no past surgery on the right knee(s).  Patient currently rates pain in the right knee(s) at 8 out of 10 with activity. Patient has worsening of pain with activity and weight bearing, pain that interferes with activities of daily living, and pain with passive range of motion.  Patient has evidence of joint space narrowing by imaging studies.  There is no active infection.  Patient Active Problem List   Diagnosis Date Noted   S/P placement of cardiac pacemaker 01/29/2022   Iron deficiency 11/06/2021   S/P total left hip arthroplasty 08/22/2021   Spondylosis without myelopathy or radiculopathy, cervical region 12/29/2019   Occipital pain 12/28/2019   Bullous pemphigoid 11/06/2019   Right renal stone 09/21/2019   Drug-induced constipation 05/25/2019   Blood in stool 05/25/2019   Internal hemorrhoids 05/25/2019   Family history of colon cancer 05/25/2019   Lumbar degenerative disc disease 11/07/2018   Cervical pain 11/07/2018   Overweight (BMI 25.0-29.9) 08/27/2018   S/P left TKA 08/26/2018   Pain in right knee 01/03/2018   Aortic atherosclerosis (HCC) 09/26/2017   Fatty liver 11/09/2016   Vitamin D deficiency 06/19/2016   Chronic fatigue 06/19/2016   Alopecia 06/19/2016   Tendon nodule 01/16/2016   Venous stasis ulcer of left lower extremity (HCC) 03/03/2015   S/P right THA, AA 08/10/2014   Primary osteoarthritis of right hip 05/27/2014   Essential  hypertension, benign 05/26/2014   IFG (impaired fasting glucose) 01/25/2014   Vitreous detachment 09/23/2012   Oral herpes 06/23/2012   OCD (obsessive compulsive disorder) 05/09/2012   Atypical ductal hyperplasia of breast 10/29/2011   Abnormal mammogram with microcalcification 10/04/2011   Obesity (BMI 30-39.9) 05/03/2011   CARRIER/SUSPECTED CARRIER GROUP B STREPTOCOCCUS 11/17/2010   Abnormal liver enzymes 10/25/2010   SOMNOLENCE 11/25/2009   Rheumatoid arthritis with rheumatoid factor (HCC) 11/30/2008   POSTMENOPAUSAL STATUS 11/30/2008   LOC OSTEOARTHROS NOT SPEC PRIM/SEC LOWER LEG 03/19/2007   Allergic rhinitis 11/05/2006   VOCAL CORD DISORDER 11/05/2006   Asthma with allergic rhinitis 11/05/2006   Hypothyroidism 09/24/2006   DEPRESSION 09/24/2006   VERTIGO, BENIGN PAROXYSMAL POSITION 09/24/2006   GERD 09/24/2006   HYPERLIPIDEMIA 09/23/2006   Past Medical History:  Diagnosis Date   Allergic rhinitis, cause unspecified    Anemia    iron   Anxiety    Asthma    Benign paroxysmal positional vertigo    Cancer (HCC)    basal cell carcinoma per right side of nostril   CHF (congestive heart failure) (HCC) 11/09/2016   had 2+ edema in legs with anxiety and SOB   Complication of anesthesia    has 4  degenerative discs in neck.   Depressive disorder, not elsewhere classified    Esophageal reflux    History of hiatal hernia    Hypertension    Localized osteoarthrosis not specified whether primary or secondary, lower leg    Other and unspecified hyperlipidemia    Other diseases of vocal cords    Peptic ulcer, unspecified site, unspecified as acute or  chronic, without mention of hemorrhage, perforation, or obstruction    Pneumonia    bilat pneumonia 1987   Pre-diabetes    no meds,just diet controll   TMJ (temporomandibular joint disorder)    Unspecified arthropathy, hand    Unspecified asthma(493.90)    triggered with a virus    Unspecified hypothyroidism     Past Surgical  History:  Procedure Laterality Date   BREAST BIOPSY  09/07/2011   High Risk Lesion   BREAST EXCISIONAL BIOPSY Left    BREAST SURGERY     LUMPECTOMY / LEFT 10/12/2011   KNEE ARTHROSCOPY     bilat    NASAL SINUS SURGERY     ROTATOR CUFF REPAIR     right    thumb surgery      left hand 45 years ago    TOTAL HIP ARTHROPLASTY Right 08/10/2014   Procedure: RIGHT TOTAL HIP ARTHROPLASTY ANTERIOR APPROACH;  Surgeon: Durene Romans, MD;  Location: [facility];  Service: Orthopedics;  Laterality: Right;   TOTAL HIP ARTHROPLASTY Left 08/22/2021   Procedure: TOTAL HIP ARTHROPLASTY ANTERIOR APPROACH;  Surgeon: Durene Romans, MD;  Location: [facility];  Service: Orthopedics;  Laterality: Left;   TOTAL KNEE ARTHROPLASTY Left 08/26/2018   Procedure: TOTAL KNEE ARTHROPLASTY;  Surgeon: Durene Romans, MD;  Location: [facility];  Service: Orthopedics;  Laterality: Left;  70 mins    No current facility-administered medications for this encounter.   Current Outpatient Medications  Medication Sig Dispense Refill Last Dose   albuterol (VENTOLIN HFA) 108 (90 Base) MCG/ACT inhaler INHALE 2 PUFFS INTO THE LUNGS EVERY 6 HOURS AS NEEDED FOR SHORTNESS OF BREATH 8.5 each 5    Ascorbic Acid (VITAMIN C WITH ROSE HIPS) 1000 MG tablet Take 1,000 mg by mouth daily.      buPROPion (WELLBUTRIN XL) 300 MG 24 hr tablet Take 300 mg by mouth daily.      Calcium Carb-Cholecalciferol (CALCIUM 600+D3 PO) Take 1 tablet by mouth 2 (two) times daily.      Cholecalciferol (VITAMIN D3) 50 MCG (2000 UT) capsule Take 2,000 Units by mouth daily.      clonazePAM (KLONOPIN) 0.5 MG tablet Take 0.125-0.25 mg by mouth daily as needed for anxiety.      famotidine (PEPCID) 20 MG tablet Take 20 mg by mouth at bedtime.       fexofenadine (ALLEGRA) 180 MG tablet Take 180 mg by mouth at bedtime.      folic acid (FOLVITE) 1 MG tablet Take 1 mg by mouth daily.      Ginkgo Biloba (GNP GINGKO BILOBA EXTRACT PO) Take 1 tablet by mouth 3 (three) times daily with meals.       Guaifenesin (MUCINEX MAXIMUM STRENGTH) 1200 MG TB12 Take 1,200 mg by mouth 2 (two) times daily.      hydrochlorothiazide (HYDRODIURIL) 25 MG tablet Take 1 tablet (25 mg total) by mouth daily. 90 tablet 3    lidocaine (LIDODERM) 5 % Place 1 patch onto the skin daily. Remove & Discard patch within 12 hours or as directed by MD (Patient taking differently: Place 1 patch onto the skin daily as needed (pain). Remove & Discard patch within 12 hours or as directed by MD) 90 patch 1    losartan (COZAAR) 50 MG tablet TAKE 1 TABLET BY MOUTH EVERY DAY 90 tablet 3    Magnesium 100 MG TABS Take 100 mg by mouth 2 (two) times a day.      meloxicam (MOBIC) 15 MG tablet TAKE 1  TABLET BY MOUTH EVERY DAY IN THE MORNING 90 tablet 0    metoprolol succinate (TOPROL-XL) 25 MG 24 hr tablet Take 1 tablet by mouth daily.      OVER THE COUNTER MEDICATION Apply 1 Application topically 2 (two) times daily. CBD roll on pain reliever      PARoxetine (PAXIL CR) 12.5 MG 24 hr tablet Take 1 tablet (12.5 mg total) by mouth daily. 90 tablet 1    polyethylene glycol (MIRALAX / GLYCOLAX) 17 g packet Take 17 g by mouth daily as needed for mild constipation. (Patient taking differently: Take 8.5 g by mouth daily as needed for mild constipation.) 14 each 0    Propylene Glycol, PF, (SYSTANE COMPLETE PF) 0.6 % SOLN Place 1 drop into both eyes 2 (two) times daily.      SYMBICORT 80-4.5 MCG/ACT inhaler TAKE 2 PUFFS BY MOUTH TWICE A DAY 30.6 each 3    SYNTHROID 75 MCG tablet Take 1 tablet (75 mcg total) by mouth daily before breakfast. 90 tablet 1    TART CHERRY PO Take 2 capsules by mouth daily.      amoxicillin-clavulanate (AUGMENTIN) 875-125 MG tablet Take 1 tablet by mouth 2 (two) times daily. (Patient not taking: Reported on 07/19/2022) 14 tablet 0 Completed Course   Allergies  Allergen Reactions   Moxifloxacin Other (See Comments)    confusion, dizziness, paranoia   Other     Black pepper -stomach pain   Astelin [Azelastine Hcl]  Other (See Comments)    Headache   Celebrex [Celecoxib] Other (See Comments)    Stomach issues   Lisinopril Cough   Methotrexate Derivatives Other (See Comments)    Flu like symptoms    Morphine Nausea And Vomiting    Social History   Tobacco Use   Smoking status: Former    Packs/day: 0.30    Years: 2.00    Additional pack years: 0.00    Total pack years: 0.60    Types: Cigarettes    Quit date: 02/12/1958    Years since quitting: 64.5   Smokeless tobacco: Never  Substance Use Topics   Alcohol use: Yes    Alcohol/week: 1.0 standard drink of alcohol    Types: 1 Glasses of wine per week    Comment: wine socially    Family History  Problem Relation Age of Onset   Heart disease Father    Colon cancer Mother    Liver cancer Brother    Asthma Brother      Review of Systems  Constitutional:  Negative for chills and fever.  Respiratory:  Negative for cough and shortness of breath.   Cardiovascular:  Negative for chest pain.  Gastrointestinal:  Negative for nausea and vomiting.  Musculoskeletal:  Positive for arthralgias.     Objective:  Physical Exam Well nourished and well developed. General: Alert and oriented x3, cooperative and pleasant, no acute distress. Head: normocephalic, atraumatic, neck supple. Eyes: EOMI.  Musculoskeletal: Right knee exam: No palpable effusion, warmth erythema No significant flexion contracture with flexion of 110 degrees Tenderness mainly over the medial and anterior aspect knee.   Calves soft and nontender. Motor function intact in LE. Strength 5/5 LE bilaterally. Neuro: Distal pulses 2+. Sensation to light touch intact in LE.  Vital signs in last 24 hours:    Labs:   Estimated body mass index is 30.21 kg/m as calculated from the following:   Height as of 07/23/22: 5\' 4"  (1.626 m).   Weight as of 07/23/22: 79.8  kg.   Imaging Review Plain radiographs demonstrate severe degenerative joint disease of the right knee(s). The  overall alignment isneutral. The bone quality appears to be adequate for age and reported activity level.      Assessment/Plan:  End stage arthritis, right knee   The patient history, physical examination, clinical judgment of the provider and imaging studies are consistent with end stage degenerative joint disease of the right knee(s) and total knee arthroplasty is deemed medically necessary. The treatment options including medical management, injection therapy arthroscopy and arthroplasty were discussed at length. The risks and benefits of total knee arthroplasty were presented and reviewed. The risks due to aseptic loosening, infection, stiffness, patella tracking problems, thromboembolic complications and other imponderables were discussed. The patient acknowledged the explanation, agreed to proceed with the plan and consent was signed. Patient is being admitted for inpatient treatment for surgery, pain control, PT, OT, prophylactic antibiotics, VTE prophylaxis, progressive ambulation and ADL's and discharge planning. The patient is planning to be discharged  home.  Therapy Plans: outpatient therapy at [facility] Disposition: Home with husband and daughter Planned DVT Prophylaxis: aspirin 81mg  BID DME needed: CPM PCP: Dr. Linford Arnold, Cardio: Edd Fabian, clearance received Pulmonologist: Hx of scarring in her lungs TXA: IV Allergies: lisinopril - cough, morphine - N/V, moxifloxacin - confusion , astelin - Anesthesia Concerns: none BMI: 32.3 Last HgbA1c: Not diabetic   Other:  - hx of UTI with foley - send with abx ahead - Prefers foley catheter despite UTI issues previously, will make a note that this can be d/c evening of POD #0 if she would like  - Hx of asthma - Oxycodone, robaxin, tylenol, meloxicam - Hx of prior TKA in 2020, bilateral hips - Has a pacemaker >> placed in December - hx of iron infusions - No hx of VTE or cancer - **prop head up to avoid issues with  GERD**    Patient's anticipated LOS is less than 2 midnights, meeting these requirements: - Younger than 18 - Lives within 1 hour of care - Has a competent adult at home to recover with post-op recover - NO history of  - Chronic pain requiring opiods  - Diabetes  - Coronary Artery Disease  - Heart failure  - Heart attack  - Stroke  - DVT/VTE  - Cardiac arrhythmia  - Respiratory Failure/COPD  - Renal failure  - Anemia  - Advanced Liver disease  Rosalene Billings, PA-C Orthopedic Surgery [facility] [place] [phone]

## 2022-07-31 ENCOUNTER — Encounter (HOSPITAL_COMMUNITY)
Admission: RE | Disposition: A | Discharge status: Home / Self Care | Payer: Self-pay | Source: Ambulatory Visit | Attending: Orthopedic Surgery

## 2022-07-31 ENCOUNTER — Observation Stay (HOSPITAL_COMMUNITY)
Admission: RE | Admit: 2022-07-31 | Discharge: 2022-08-01 | Disposition: A | Discharge status: Home / Self Care | Payer: Medicare Other | Source: Ambulatory Visit | Attending: Orthopedic Surgery | Admitting: Orthopedic Surgery

## 2022-07-31 ENCOUNTER — Encounter (HOSPITAL_COMMUNITY): Payer: Self-pay | Admitting: Orthopedic Surgery

## 2022-07-31 ENCOUNTER — Ambulatory Visit (HOSPITAL_BASED_OUTPATIENT_CLINIC_OR_DEPARTMENT_OTHER): Payer: Medicare Other | Admitting: Anesthesiology

## 2022-07-31 ENCOUNTER — Ambulatory Visit (HOSPITAL_COMMUNITY): Payer: Medicare Other | Admitting: Physician Assistant

## 2022-07-31 ENCOUNTER — Other Ambulatory Visit: Payer: Self-pay

## 2022-07-31 DIAGNOSIS — Z96643 Presence of artificial hip joint, bilateral: Secondary | ICD-10-CM | POA: Insufficient documentation

## 2022-07-31 DIAGNOSIS — E039 Hypothyroidism, unspecified: Secondary | ICD-10-CM | POA: Diagnosis not present

## 2022-07-31 DIAGNOSIS — Z96652 Presence of left artificial knee joint: Secondary | ICD-10-CM | POA: Diagnosis not present

## 2022-07-31 DIAGNOSIS — Z950 Presence of cardiac pacemaker: Secondary | ICD-10-CM | POA: Insufficient documentation

## 2022-07-31 DIAGNOSIS — Z85828 Personal history of other malignant neoplasm of skin: Secondary | ICD-10-CM | POA: Insufficient documentation

## 2022-07-31 DIAGNOSIS — I110 Hypertensive heart disease with heart failure: Secondary | ICD-10-CM | POA: Insufficient documentation

## 2022-07-31 DIAGNOSIS — Z79899 Other long term (current) drug therapy: Secondary | ICD-10-CM | POA: Insufficient documentation

## 2022-07-31 DIAGNOSIS — J45909 Unspecified asthma, uncomplicated: Secondary | ICD-10-CM | POA: Insufficient documentation

## 2022-07-31 DIAGNOSIS — Z87891 Personal history of nicotine dependence: Secondary | ICD-10-CM

## 2022-07-31 DIAGNOSIS — M1711 Unilateral primary osteoarthritis, right knee: Secondary | ICD-10-CM | POA: Diagnosis not present

## 2022-07-31 DIAGNOSIS — Z96651 Presence of right artificial knee joint: Secondary | ICD-10-CM

## 2022-07-31 DIAGNOSIS — I10 Essential (primary) hypertension: Secondary | ICD-10-CM | POA: Diagnosis not present

## 2022-07-31 DIAGNOSIS — G8918 Other acute postprocedural pain: Secondary | ICD-10-CM | POA: Diagnosis not present

## 2022-07-31 DIAGNOSIS — I509 Heart failure, unspecified: Secondary | ICD-10-CM | POA: Diagnosis not present

## 2022-07-31 HISTORY — PX: TOTAL KNEE ARTHROPLASTY: SHX125

## 2022-07-31 LAB — GLUCOSE, CAPILLARY: Glucose-Capillary: 126 mg/dL — ABNORMAL HIGH (ref 70–99)

## 2022-07-31 SURGERY — ARTHROPLASTY, KNEE, TOTAL
Anesthesia: Spinal | Site: Knee | Laterality: Right

## 2022-07-31 MED ORDER — CEFAZOLIN SODIUM-DEXTROSE 2-4 GM/100ML-% IV SOLN
2.0000 g | INTRAVENOUS | Status: AC
  Administered 2022-07-31: 2 g via INTRAVENOUS
  Filled 2022-07-31: qty 100

## 2022-07-31 MED ORDER — ROPIVACAINE HCL 7.5 MG/ML IJ SOLN
INTRAMUSCULAR | Status: DC | PRN
  Administered 2022-07-31: 20 mL via PERINEURAL

## 2022-07-31 MED ORDER — DEXAMETHASONE SODIUM PHOSPHATE 10 MG/ML IJ SOLN
8.0000 mg | Freq: Once | INTRAMUSCULAR | Status: AC
  Administered 2022-07-31: 8 mg via INTRAVENOUS

## 2022-07-31 MED ORDER — SODIUM CHLORIDE 0.9 % IR SOLN
Status: DC | PRN
  Administered 2022-07-31: 1000 mL

## 2022-07-31 MED ORDER — DIPHENHYDRAMINE HCL 12.5 MG/5ML PO ELIX: 12.5000 mg | ORAL_SOLUTION | ORAL | Status: DC | PRN

## 2022-07-31 MED ORDER — SODIUM CHLORIDE (PF) 0.9 % IJ SOLN
INTRAMUSCULAR | Status: DC | PRN
  Administered 2022-07-31: 30 mL

## 2022-07-31 MED ORDER — BUPROPION HCL ER (XL) 300 MG PO TB24
300.0000 mg | ORAL_TABLET | Freq: Every day | ORAL | Status: DC
  Administered 2022-08-01: 300 mg via ORAL
  Filled 2022-07-31: qty 1

## 2022-07-31 MED ORDER — DEXAMETHASONE SODIUM PHOSPHATE 10 MG/ML IJ SOLN
INTRAMUSCULAR | Status: AC
  Filled 2022-07-31: qty 1

## 2022-07-31 MED ORDER — ALBUTEROL SULFATE (2.5 MG/3ML) 0.083% IN NEBU: 3.0000 mL | INHALATION_SOLUTION | Freq: Four times a day (QID) | RESPIRATORY_TRACT | Status: DC | PRN

## 2022-07-31 MED ORDER — MENTHOL 3 MG MT LOZG: 1.0000 | LOZENGE | OROMUCOSAL | Status: DC | PRN

## 2022-07-31 MED ORDER — LEVOTHYROXINE SODIUM 75 MCG PO TABS
75.0000 ug | ORAL_TABLET | Freq: Every day | ORAL | Status: DC
  Administered 2022-08-01: 75 ug via ORAL
  Filled 2022-07-31: qty 1

## 2022-07-31 MED ORDER — BUPIVACAINE HCL (PF) 0.25 % IJ SOLN
INTRAMUSCULAR | Status: AC
  Filled 2022-07-31: qty 30

## 2022-07-31 MED ORDER — METOPROLOL SUCCINATE ER 25 MG PO TB24
25.0000 mg | ORAL_TABLET | Freq: Every day | ORAL | Status: DC
  Administered 2022-07-31: 25 mg via ORAL
  Filled 2022-07-31: qty 1

## 2022-07-31 MED ORDER — CHLORHEXIDINE GLUCONATE 0.12 % MT SOLN
15.0000 mL | Freq: Once | OROMUCOSAL | Status: AC
  Administered 2022-07-31: 15 mL via OROMUCOSAL

## 2022-07-31 MED ORDER — LOSARTAN POTASSIUM 50 MG PO TABS
50.0000 mg | ORAL_TABLET | Freq: Every day | ORAL | Status: DC
  Filled 2022-07-31: qty 1

## 2022-07-31 MED ORDER — ONDANSETRON HCL 4 MG PO TABS: 4.0000 mg | ORAL_TABLET | Freq: Four times a day (QID) | ORAL | Status: DC | PRN

## 2022-07-31 MED ORDER — OXYCODONE HCL 5 MG PO TABS
5.0000 mg | ORAL_TABLET | ORAL | Status: DC | PRN
  Administered 2022-08-01: 5 mg via ORAL
  Filled 2022-07-31 (×2): qty 1

## 2022-07-31 MED ORDER — KETOROLAC TROMETHAMINE 30 MG/ML IJ SOLN
INTRAMUSCULAR | Status: DC | PRN
  Administered 2022-07-31: 30 mg via INTRAMUSCULAR

## 2022-07-31 MED ORDER — ONDANSETRON HCL 4 MG/2ML IJ SOLN: 4.0000 mg | Freq: Once | INTRAMUSCULAR | Status: DC | PRN

## 2022-07-31 MED ORDER — METOCLOPRAMIDE HCL 5 MG/ML IJ SOLN: 5.0000 mg | Freq: Three times a day (TID) | INTRAMUSCULAR | Status: DC | PRN

## 2022-07-31 MED ORDER — BUPIVACAINE IN DEXTROSE 0.75-8.25 % IT SOLN
INTRATHECAL | Status: DC | PRN
  Administered 2022-07-31: 1.4 mL via INTRATHECAL

## 2022-07-31 MED ORDER — METHOCARBAMOL 500 MG IVPB - SIMPLE MED
INTRAVENOUS | Status: AC
  Administered 2022-07-31: 500 mg via INTRAVENOUS
  Filled 2022-07-31: qty 55

## 2022-07-31 MED ORDER — DOCUSATE SODIUM 100 MG PO CAPS
100.0000 mg | ORAL_CAPSULE | Freq: Two times a day (BID) | ORAL | Status: DC
  Administered 2022-07-31 – 2022-08-01 (×2): 100 mg via ORAL
  Filled 2022-07-31 (×2): qty 1

## 2022-07-31 MED ORDER — FENTANYL CITRATE PF 50 MCG/ML IJ SOSY
PREFILLED_SYRINGE | INTRAMUSCULAR | Status: AC
  Administered 2022-07-31: 50 ug via INTRAVENOUS
  Filled 2022-07-31: qty 2

## 2022-07-31 MED ORDER — FENTANYL CITRATE PF 50 MCG/ML IJ SOSY
25.0000 ug | PREFILLED_SYRINGE | INTRAMUSCULAR | Status: DC | PRN
  Administered 2022-07-31: 50 ug via INTRAVENOUS

## 2022-07-31 MED ORDER — FAMOTIDINE 20 MG PO TABS
20.0000 mg | ORAL_TABLET | Freq: Every day | ORAL | Status: DC
  Administered 2022-07-31: 20 mg via ORAL
  Filled 2022-07-31: qty 1

## 2022-07-31 MED ORDER — LACTATED RINGERS IV SOLN: INTRAVENOUS | Status: DC

## 2022-07-31 MED ORDER — ASPIRIN 81 MG PO CHEW
81.0000 mg | CHEWABLE_TABLET | Freq: Two times a day (BID) | ORAL | Status: DC
  Administered 2022-07-31 – 2022-08-01 (×2): 81 mg via ORAL
  Filled 2022-07-31 (×2): qty 1

## 2022-07-31 MED ORDER — HYDROCHLOROTHIAZIDE 25 MG PO TABS
25.0000 mg | ORAL_TABLET | Freq: Every day | ORAL | Status: DC
  Administered 2022-08-01: 25 mg via ORAL
  Filled 2022-07-31: qty 1

## 2022-07-31 MED ORDER — POVIDONE-IODINE 10 % EX SWAB: 2.0000 | Freq: Once | CUTANEOUS | Status: DC

## 2022-07-31 MED ORDER — FENTANYL CITRATE PF 50 MCG/ML IJ SOSY
100.0000 ug | PREFILLED_SYRINGE | Freq: Once | INTRAMUSCULAR | Status: AC
  Administered 2022-07-31: 50 ug via INTRAVENOUS
  Filled 2022-07-31: qty 2

## 2022-07-31 MED ORDER — PROPOFOL 500 MG/50ML IV EMUL
INTRAVENOUS | Status: DC | PRN
  Administered 2022-07-31: 50 ug/kg/min via INTRAVENOUS

## 2022-07-31 MED ORDER — OXYCODONE HCL 5 MG PO TABS: 5.0000 mg | ORAL_TABLET | Freq: Once | ORAL | Status: DC | PRN

## 2022-07-31 MED ORDER — OXYCODONE HCL 5 MG/5ML PO SOLN: 5.0000 mg | Freq: Once | ORAL | Status: DC | PRN

## 2022-07-31 MED ORDER — SODIUM CHLORIDE 0.9 % IV SOLN: INTRAVENOUS | Status: DC

## 2022-07-31 MED ORDER — PHENOL 1.4 % MT LIQD: 1.0000 | OROMUCOSAL | Status: DC | PRN

## 2022-07-31 MED ORDER — PAROXETINE HCL ER 12.5 MG PO TB24
12.5000 mg | ORAL_TABLET | Freq: Every day | ORAL | Status: DC
  Administered 2022-08-01: 12.5 mg via ORAL
  Filled 2022-07-31: qty 1

## 2022-07-31 MED ORDER — FEXOFENADINE HCL 180 MG PO TABS
180.0000 mg | ORAL_TABLET | Freq: Every day | ORAL | Status: DC
  Administered 2022-07-31: 180 mg via ORAL
  Filled 2022-07-31: qty 1

## 2022-07-31 MED ORDER — SODIUM CHLORIDE (PF) 0.9 % IJ SOLN
INTRAMUSCULAR | Status: AC
  Filled 2022-07-31: qty 30

## 2022-07-31 MED ORDER — POLYETHYLENE GLYCOL 3350 17 G PO PACK
17.0000 g | PACK | Freq: Two times a day (BID) | ORAL | Status: DC
  Administered 2022-07-31 – 2022-08-01 (×2): 17 g via ORAL
  Filled 2022-07-31 (×2): qty 1

## 2022-07-31 MED ORDER — TRANEXAMIC ACID-NACL 1000-0.7 MG/100ML-% IV SOLN
1000.0000 mg | INTRAVENOUS | Status: AC
  Administered 2022-07-31: 1000 mg via INTRAVENOUS
  Filled 2022-07-31: qty 100

## 2022-07-31 MED ORDER — ACETAMINOPHEN 500 MG PO TABS
1000.0000 mg | ORAL_TABLET | Freq: Once | ORAL | Status: AC
  Administered 2022-07-31: 1000 mg via ORAL
  Filled 2022-07-31: qty 2

## 2022-07-31 MED ORDER — ONDANSETRON HCL 4 MG/2ML IJ SOLN
INTRAMUSCULAR | Status: DC | PRN
  Administered 2022-07-31: 4 mg via INTRAVENOUS

## 2022-07-31 MED ORDER — MELOXICAM 15 MG PO TABS
15.0000 mg | ORAL_TABLET | Freq: Every day | ORAL | Status: DC
  Administered 2022-07-31 – 2022-08-01 (×2): 15 mg via ORAL
  Filled 2022-07-31 (×2): qty 1

## 2022-07-31 MED ORDER — TRANEXAMIC ACID-NACL 1000-0.7 MG/100ML-% IV SOLN
1000.0000 mg | Freq: Once | INTRAVENOUS | Status: AC
  Administered 2022-07-31: 1000 mg via INTRAVENOUS
  Filled 2022-07-31: qty 100

## 2022-07-31 MED ORDER — BUPIVACAINE-EPINEPHRINE (PF) 0.25% -1:200000 IJ SOLN
INTRAMUSCULAR | Status: DC | PRN
  Administered 2022-07-31: 30 mL

## 2022-07-31 MED ORDER — METHOCARBAMOL 500 MG PO TABS: 500.0000 mg | ORAL_TABLET | Freq: Four times a day (QID) | ORAL | Status: DC | PRN

## 2022-07-31 MED ORDER — ONDANSETRON HCL 4 MG/2ML IJ SOLN
INTRAMUSCULAR | Status: AC
  Filled 2022-07-31: qty 2

## 2022-07-31 MED ORDER — SENNA 8.6 MG PO TABS
2.0000 | ORAL_TABLET | Freq: Every day | ORAL | Status: DC
  Administered 2022-07-31: 17.2 mg via ORAL
  Filled 2022-07-31: qty 2

## 2022-07-31 MED ORDER — 0.9 % SODIUM CHLORIDE (POUR BTL) OPTIME
TOPICAL | Status: DC | PRN
  Administered 2022-07-31: 1000 mL

## 2022-07-31 MED ORDER — HYDROMORPHONE HCL 1 MG/ML IJ SOLN: 0.5000 mg | INTRAMUSCULAR | Status: DC | PRN

## 2022-07-31 MED ORDER — KETOROLAC TROMETHAMINE 30 MG/ML IJ SOLN
INTRAMUSCULAR | Status: AC
  Filled 2022-07-31: qty 1

## 2022-07-31 MED ORDER — PHENYLEPHRINE HCL-NACL 20-0.9 MG/250ML-% IV SOLN
INTRAVENOUS | Status: DC | PRN
  Administered 2022-07-31: 25 ug/min via INTRAVENOUS

## 2022-07-31 MED ORDER — CEFAZOLIN SODIUM-DEXTROSE 2-4 GM/100ML-% IV SOLN
2.0000 g | Freq: Four times a day (QID) | INTRAVENOUS | Status: AC
  Administered 2022-07-31 – 2022-08-01 (×2): 2 g via INTRAVENOUS
  Filled 2022-07-31 (×2): qty 100

## 2022-07-31 MED ORDER — EPINEPHRINE PF 1 MG/ML IJ SOLN
INTRAMUSCULAR | Status: AC
  Filled 2022-07-31: qty 1

## 2022-07-31 MED ORDER — MIDAZOLAM HCL 2 MG/2ML IJ SOLN: 2.0000 mg | Freq: Once | INTRAMUSCULAR | Status: DC

## 2022-07-31 MED ORDER — DEXAMETHASONE SODIUM PHOSPHATE 10 MG/ML IJ SOLN
10.0000 mg | Freq: Once | INTRAMUSCULAR | Status: AC
  Administered 2022-08-01: 10 mg via INTRAVENOUS
  Filled 2022-07-31: qty 1

## 2022-07-31 MED ORDER — METHOCARBAMOL 500 MG IVPB - SIMPLE MED: 500.0000 mg | Freq: Four times a day (QID) | INTRAVENOUS | Status: DC | PRN

## 2022-07-31 MED ORDER — BISACODYL 10 MG RE SUPP: 10.0000 mg | Freq: Every day | RECTAL | Status: DC | PRN

## 2022-07-31 MED ORDER — ONDANSETRON HCL 4 MG/2ML IJ SOLN: 4.0000 mg | Freq: Four times a day (QID) | INTRAMUSCULAR | Status: DC | PRN

## 2022-07-31 MED ORDER — MOMETASONE FURO-FORMOTEROL FUM 100-5 MCG/ACT IN AERO
2.0000 | INHALATION_SPRAY | Freq: Two times a day (BID) | RESPIRATORY_TRACT | Status: DC
  Filled 2022-07-31: qty 8.8

## 2022-07-31 MED ORDER — LACTATED RINGERS IV SOLN: INTRAVENOUS | Status: DC | PRN

## 2022-07-31 MED ORDER — OXYCODONE HCL 5 MG PO TABS
10.0000 mg | ORAL_TABLET | ORAL | Status: DC | PRN
  Administered 2022-07-31: 10 mg via ORAL
  Filled 2022-07-31: qty 2

## 2022-07-31 MED ORDER — METOCLOPRAMIDE HCL 5 MG PO TABS: 5.0000 mg | ORAL_TABLET | Freq: Three times a day (TID) | ORAL | Status: DC | PRN

## 2022-07-31 MED ORDER — PROPOFOL 10 MG/ML IV BOLUS
INTRAVENOUS | Status: AC
  Filled 2022-07-31: qty 20

## 2022-07-31 MED ORDER — ACETAMINOPHEN 500 MG PO TABS
1000.0000 mg | ORAL_TABLET | Freq: Four times a day (QID) | ORAL | Status: DC
  Administered 2022-07-31 – 2022-08-01 (×3): 1000 mg via ORAL
  Filled 2022-07-31 (×3): qty 2

## 2022-07-31 SURGICAL SUPPLY — 58 items
ADH SKN CLS APL DERMABOND .7 (GAUZE/BANDAGES/DRESSINGS) ×1
ATTUNE MED ANAT PAT 35 KNEE (Knees) IMPLANT
BAG COUNTER SPONGE SURGICOUNT (BAG) IMPLANT
BAG SPEC THK2 15X12 ZIP CLS (MISCELLANEOUS)
BAG SPNG CNTER NS LX DISP (BAG)
BAG ZIPLOCK 12X15 (MISCELLANEOUS) IMPLANT
BASEPLATE TIB CMT FB PCKT SZ4 (Stem) IMPLANT
BLADE SAW SGTL 11.0X1.19X90.0M (BLADE) IMPLANT
BLADE SAW SGTL 13.0X1.19X90.0M (BLADE) ×1 IMPLANT
BNDG CMPR 5X62 HK CLSR LF (GAUZE/BANDAGES/DRESSINGS) ×1
BNDG CMPR MED 10X6 ELC LF (GAUZE/BANDAGES/DRESSINGS) ×1
BNDG ELASTIC 6INX 5YD STR LF (GAUZE/BANDAGES/DRESSINGS) ×1 IMPLANT
BNDG ELASTIC 6X10 VLCR STRL LF (GAUZE/BANDAGES/DRESSINGS) IMPLANT
BOWL SMART MIX CTS (DISPOSABLE) ×1 IMPLANT
BSPLAT TIB 4 CMNT FX BRNG STRL (Stem) ×1 IMPLANT
CEMENT HV SMART SET (Cement) IMPLANT
COMP FEM CMT ATTUNE NRW 5 RT (Joint) ×1 IMPLANT
COMPONENT FEM CMT ATTN NRW 5RT (Joint) IMPLANT
COVER SURGICAL LIGHT HANDLE (MISCELLANEOUS) ×1 IMPLANT
CUFF TOURN SGL QUICK 34 (TOURNIQUET CUFF) ×1
CUFF TRNQT CYL 34X4.125X (TOURNIQUET CUFF) ×1 IMPLANT
DERMABOND ADVANCED .7 DNX12 (GAUZE/BANDAGES/DRESSINGS) ×1 IMPLANT
DRAPE U-SHAPE 47X51 STRL (DRAPES) ×1 IMPLANT
DRESSING AQUACEL AG SP 3.5X10 (GAUZE/BANDAGES/DRESSINGS) ×1 IMPLANT
DRSG AQUACEL AG ADV 3.5X10 (GAUZE/BANDAGES/DRESSINGS) IMPLANT
DRSG AQUACEL AG SP 3.5X10 (GAUZE/BANDAGES/DRESSINGS) ×1
DURAPREP 26ML APPLICATOR (WOUND CARE) ×2 IMPLANT
ELECT REM PT RETURN 15FT ADLT (MISCELLANEOUS) ×1 IMPLANT
GLOVE BIO SURGEON STRL SZ 6 (GLOVE) ×1 IMPLANT
GLOVE BIOGEL PI IND STRL 6.5 (GLOVE) ×1 IMPLANT
GLOVE BIOGEL PI IND STRL 7.5 (GLOVE) ×1 IMPLANT
GLOVE ORTHO TXT STRL SZ7.5 (GLOVE) ×2 IMPLANT
GOWN STRL REUS W/ TWL LRG LVL3 (GOWN DISPOSABLE) ×2 IMPLANT
GOWN STRL REUS W/TWL LRG LVL3 (GOWN DISPOSABLE) ×2
HANDPIECE INTERPULSE COAX TIP (DISPOSABLE) ×1
HOLDER FOLEY CATH W/STRAP (MISCELLANEOUS) IMPLANT
INSERT TIB ATTUNE KNEE 5 RT (Insert) IMPLANT
KIT TURNOVER KIT A (KITS) IMPLANT
MANIFOLD NEPTUNE II (INSTRUMENTS) ×1 IMPLANT
NDL SAFETY ECLIP 18X1.5 (MISCELLANEOUS) IMPLANT
NS IRRIG 1000ML POUR BTL (IV SOLUTION) ×1 IMPLANT
PACK TOTAL KNEE CUSTOM (KITS) ×1 IMPLANT
PIN FIX SIGMA LCS THRD HI (PIN) IMPLANT
PROTECTOR NERVE ULNAR (MISCELLANEOUS) ×1 IMPLANT
SET HNDPC FAN SPRY TIP SCT (DISPOSABLE) ×1 IMPLANT
SET PAD KNEE POSITIONER (MISCELLANEOUS) ×1 IMPLANT
SPIKE FLUID TRANSFER (MISCELLANEOUS) ×2 IMPLANT
SUT MNCRL AB 4-0 PS2 18 (SUTURE) ×1 IMPLANT
SUT STRATAFIX PDS+ 0 24IN (SUTURE) ×1 IMPLANT
SUT VIC AB 1 CT1 36 (SUTURE) ×1 IMPLANT
SUT VIC AB 2-0 CT1 27 (SUTURE) ×2
SUT VIC AB 2-0 CT1 TAPERPNT 27 (SUTURE) ×2 IMPLANT
SYR 3ML LL SCALE MARK (SYRINGE) ×1 IMPLANT
TOWEL GREEN STERILE FF (TOWEL DISPOSABLE) ×1 IMPLANT
TRAY FOLEY MTR SLVR 16FR STAT (SET/KITS/TRAYS/PACK) ×1 IMPLANT
TUBE SUCTION HIGH CAP CLEAR NV (SUCTIONS) ×1 IMPLANT
WATER STERILE IRR 1000ML POUR (IV SOLUTION) ×2 IMPLANT
WRAP KNEE MAXI GEL POST OP (GAUZE/BANDAGES/DRESSINGS) ×1 IMPLANT

## 2022-07-31 NOTE — Anesthesia Postprocedure Evaluation (Signed)
Anesthesia Post Note  Patient: [name]  Procedure(s) Performed: TOTAL KNEE ARTHROPLASTY (Right: Knee)     Patient location during evaluation: PACU Anesthesia Type: Spinal Level of consciousness: awake and alert Pain management: pain level controlled Vital Signs Assessment: post-procedure vital signs reviewed and stable Respiratory status: spontaneous breathing and respiratory function stable Cardiovascular status: blood pressure returned to baseline and stable Postop Assessment: spinal receding and no apparent nausea or vomiting Anesthetic complications: no   No notable events documented.  Last Vitals:  Vitals:   07/31/22 1810 07/31/22 1820  BP: 111/65 107/79  Pulse: 70 69  Resp: 13 16  Temp:  36.6 C  SpO2: 98% 96%    Last Pain:  Vitals:   07/31/22 1820  TempSrc: Oral  PainSc: 0-No pain                 Beryle Lathe

## 2022-07-31 NOTE — Plan of Care (Signed)

## 2022-07-31 NOTE — Op Note (Addendum)
NAME:  [name]                      MEDICAL RECORD NO.:  [id number]                             FACILITY:  [facility]      PHYSICIAN:  Madlyn Frankel. Charlann Boxer, M.D.  DATE OF BIRTH:  1945-09-13      DATE OF PROCEDURE:  07/31/2022                                     OPERATIVE REPORT         PREOPERATIVE DIAGNOSIS:  Right knee osteoarthritis.      POSTOPERATIVE DIAGNOSIS:  Right knee osteoarthritis.      FINDINGS:  The patient was noted to have complete loss of cartilage and   bone-on-bone arthritis with associated osteophytes in the medial and patellofemoral compartments of   the knee with a significant synovitis and associated effusion.  The patient had failed months of conservative treatment including medications, injection therapy, activity modification.     PROCEDURE:  Right total knee replacement.      COMPONENTS USED:  DePuy Attune FB CR MS knee   system, a size 5N femur, 4 tibia, size 5 mm CR MS AOX insert, and 32 anatomic patellar   button.      SURGEON:  Madlyn Frankel. Charlann Boxer, M.D.      ASSISTANT:  Rosalene Billings, PA-C.      ANESTHESIA:  Regional and Spinal.      SPECIMENS:  None.      COMPLICATION:  None.      DRAINS:  None.  EBL: <100 cc      TOURNIQUET TIME:   Total Tourniquet Time Documented: Thigh (Right) - 29 minutes Total: Thigh (Right) - 29 minutes  .      The patient was stable to the recovery room.      INDICATION FOR PROCEDURE:  [name] is a 77 y.o. female patient of   mine.  The patient had been seen, evaluated, and treated for months conservatively in the   office with medication, activity modification, and injections.  The patient had   radiographic changes of bone-on-bone arthritis with endplate sclerosis and osteophytes noted.  Based on the radiographic changes and failed conservative measures, the patient   decided to proceed with definitive treatment, total knee replacement.  Risks of infection, DVT, component failure, need for revision  surgery, neurovascular injury were reviewed in the office setting.  The postop course was reviewed stressing the efforts to maximize post-operative satisfaction and function.  Consent was obtained for benefit of pain   relief.      PROCEDURE IN DETAIL:  The patient was brought to the operative theater.   Once adequate anesthesia, preoperative antibiotics, 2 gm of Ancef,1 gm of Tranexamic Acid, and 10 mg of Decadron administered, the patient was positioned supine with a right thigh tourniquet placed.  The  right lower extremity was prepped and draped in sterile fashion.  A time-   out was performed identifying the patient, planned procedure, and the appropriate extremity.      The right lower extremity was placed in the [facility] leg holder.  The leg was   exsanguinated, tourniquet elevated to 250 mmHg.  A midline incision was   made followed  by median parapatellar arthrotomy.  Following initial   exposure, attention was first directed to the patella.  Precut   measurement was noted to be 22 mm.  I resected down to 13-14 mm and used a   32 anatomic patellar button to restore patellar height as well as cover the cut surface.      The lug holes were drilled and a metal shim was placed to protect the   patella from retractors and saw blade during the procedure.      At this point, attention was now directed to the femur.  The femoral   canal was opened with a drill, irrigated to try to prevent fat emboli.  An   intramedullary rod was passed at 3 degrees valgus, 11 mm of bone was   resected off the distal femur due to pre-operative flexion contracture.  Following this resection, the tibia was   subluxated anteriorly.  Using the extramedullary guide, 2 mm of bone was resected off   the proximal medial tibia.  We confirmed the gap would be   stable medially and laterally with a size 5 spacer block as well as confirmed that the tibial cut was perpendicular in the coronal plane, checking with an  alignment rod.      Once this was done, I sized the femur to be a size 5 in the anterior-   posterior dimension, chose a narrow component based on medial and   lateral dimension.  The size 5 rotation block was then pinned in   position anterior referenced using the C-clamp to set rotation.  The   anterior, posterior, and  chamfer cuts were made without difficulty nor   notching making certain that I was along the anterior cortex to help   with flexion gap stability.      The final shim cut was made off the lateral aspect of distal femur.      At this point, the tibia was sized to be a size 4.  The size 4 tray was   then pinned in position through the medial third of the tubercle,   drilled, and keel punched.  Trial reduction was now carried with a 5 femur,  4 tibia, a size 5 mm CR MS insert, and the 32 anatomic patella botton.  The knee was brought to full extension with good flexion stability with the patella   tracking through the trochlea without application of pressure.  Given   all these findings the trial components removed.  Final components were   opened and cement was mixed.  The knee was irrigated with normal saline solution and pulse lavage.  The synovial lining was   then injected with 30 cc of 0.25% Marcaine with epinephrine, 1 cc of Toradol and 30 cc of NS for a total of 61 cc.     Final implants were then cemented onto cleaned and dried cut surfaces of bone with the knee brought to extension with a size 5 mm CR MS trial insert.      Once the cement had fully cured, excess cement was removed   throughout the knee.  I confirmed that I was satisfied with the range of   motion and stability, and the final size 5 mm CR MS AOX insert was chosen.  It was   placed into the knee.      The tourniquet had been let down at 29 minutes.  No significant   hemostasis was required.  The extensor mechanism  was then reapproximated using #1 Vicryl and #1 Stratafix sutures with the knee   in  flexion.  The   remaining wound was closed with 2-0 Vicryl and running 4-0 Monocryl.   The knee was cleaned, dried, dressed sterilely using Dermabond and   Aquacel dressing.  The patient was then   brought to recovery room in stable condition, tolerating the procedure   well.   Please note that Physician Assistant, Rosalene Billings, PA-C was present for the entirety of the case, and was utilized for pre-operative positioning, peri-operative retractor management, general facilitation of the procedure and for primary wound closure at the end of the case.              Madlyn Frankel Charlann Boxer, M.D.    07/31/2022 4:31 PM

## 2022-07-31 NOTE — Anesthesia Preprocedure Evaluation (Addendum)
Anesthesia Evaluation  Patient identified by MRN, date of birth, ID band Patient awake    Reviewed: Allergy & Precautions, NPO status , Patient's Chart, lab work & pertinent test results  History of Anesthesia Complications Negative for: history of anesthetic complications  Airway Mallampati: III  TM Distance: >3 FB Neck ROM: Full    Dental  (+) Dental Advisory Given, Teeth Intact   Pulmonary asthma , former smoker   Pulmonary exam normal        Cardiovascular hypertension, Pt. on medications and Pt. on home beta blockers Normal cardiovascular exam+ pacemaker    '22 Myoperfusion - The study is normal. The study is low risk.   No ST deviation was noted.   LV perfusion is normal. There is no evidence of ischemia. There is no evidence of infarction.   Left ventricular function is normal. Nuclear stress EF: 72 %. The left ventricular ejection fraction is hyperdynamic (>65%). End diastolic cavity size is normal. End systolic cavity size is normal.     Neuro/Psych  Headaches PSYCHIATRIC DISORDERS Anxiety Depression     OCD  BPPV     GI/Hepatic Neg liver ROS, hiatal hernia, PUD,GERD  Medicated,,  Endo/Other  Hypothyroidism   Pre-DM   Renal/GU negative Renal ROS     Musculoskeletal  (+) Arthritis , Osteoarthritis and Rheumatoid disorders,   TMJ disorder    Abdominal   Peds  Hematology negative hematology ROS (+)   Anesthesia Other Findings Vocal cord disorder   Reproductive/Obstetrics                             Anesthesia Physical Anesthesia Plan  ASA: 3  Anesthesia Plan: Spinal   Post-op Pain Management: Regional block* and Tylenol PO (pre-op)*   Induction:   PONV Risk Score and Plan: 2 and Treatment may vary due to age or medical condition, Propofol infusion and Ondansetron  Airway Management Planned: Natural Airway and Simple Face Mask  Additional Equipment:  None  Intra-op Plan:   Post-operative Plan:   Informed Consent: I have reviewed the patients History and Physical, chart, labs and discussed the procedure including the risks, benefits and alternatives for the proposed anesthesia with the patient or authorized representative who has indicated his/her understanding and acceptance.       Plan Discussed with: CRNA and Anesthesiologist  Anesthesia Plan Comments:        Anesthesia Quick Evaluation

## 2022-07-31 NOTE — Discharge Instructions (Signed)

## 2022-07-31 NOTE — Anesthesia Procedure Notes (Signed)
Anesthesia Regional Block: Adductor canal block   Pre-Anesthetic Checklist: , timeout performed,  Correct Patient, Correct Site, Correct Laterality,  Correct Procedure, Correct Position, site marked,  Risks and benefits discussed,  Surgical consent,  Pre-op evaluation,  At surgeon's request and post-op pain management  Laterality: Right  Prep: chloraprep       Needles:  Injection technique: Single-shot  Needle Type: Echogenic Needle     Needle Length: 10cm  Needle Gauge: 21     Additional Needles:   Narrative:  Start time: 07/31/2022 2:13 PM End time: 07/31/2022 2:16 PM Injection made incrementally with aspirations every 5 mL.  Performed by: Personally  Anesthesiologist: Beryle Lathe, MD  Additional Notes: No pain on injection. No increased resistance to injection. Injection made in 5cc increments. Good needle visualization. Patient tolerated the procedure well.

## 2022-07-31 NOTE — Interval H&P Note (Signed)
History and Physical Interval Note:  07/31/2022 11:45 AM  [name]  has presented today for surgery, with the diagnosis of Right knee osteoarthritis.  The various methods of treatment have been discussed with the patient and family. After consideration of risks, benefits and other options for treatment, the patient has consented to  Procedure(s): TOTAL KNEE ARTHROPLASTY (Right) as a surgical intervention.  The patient's history has been reviewed, patient examined, no change in status, stable for surgery.  I have reviewed the patient's chart and labs.  Questions were answered to the patient's satisfaction.     Shelda Pal

## 2022-07-31 NOTE — Anesthesia Procedure Notes (Signed)
Spinal  Patient location during procedure: OR Start time: 07/31/2022 3:12 PM End time: 07/31/2022 3:16 PM Reason for block: surgical anesthesia Staffing Performed: anesthesiologist  Anesthesiologist: Beryle Lathe, MD Performed by: Beryle Lathe, MD Authorized by: Beryle Lathe, MD   Preanesthetic Checklist Completed: patient identified, IV checked, risks and benefits discussed, surgical consent, monitors and equipment checked, pre-op evaluation and timeout performed Spinal Block Patient position: sitting Prep: DuraPrep Patient monitoring: heart rate, cardiac monitor, continuous pulse ox and blood pressure Approach: midline Location: L3-4 Injection technique: single-shot Needle Needle type: Quincke  Needle gauge: 22 G Additional Notes Consent was obtained prior to the procedure with all questions answered and concerns addressed. Risks including, but not limited to, bleeding, infection, nerve damage, paralysis, failed block, inadequate analgesia, allergic reaction, high spinal, itching, and headache were discussed and the patient wished to proceed. Functioning IV was confirmed and monitors were applied. Sterile prep and drape, including hand hygiene, mask, and sterile gloves were used. The patient was positioned and the spine was prepped. The skin was anesthetized with lidocaine. Free flow of clear CSF was obtained prior to injecting local anesthetic into the CSF. The spinal needle aspirated freely following injection. The needle was carefully withdrawn. The patient tolerated the procedure well.   Leslye Peer, MD

## 2022-07-31 NOTE — Care Plan (Signed)
Ortho Bundle Case Management Note  Patient Details  Name: [name] MRN: [id number] Date of Birth: [birth year]                  R TKA on 07/31/22.  DCP: Home with husband and daughter.  DME: Has RW. CPM ordered through [employer].  PT: EO 6/21   DME Arranged:  CPM DME Agency:  [employer]    Additional Comments: Please contact me with any questions of if this plan should need to change.    Despina Pole, Case Manager  [facility]  [phone] 07/31/2022, 11:18 AM

## 2022-07-31 NOTE — Transfer of Care (Signed)
Immediate Anesthesia Transfer of Care Note  Patient: [name]  Procedure(s) Performed: TOTAL KNEE ARTHROPLASTY (Right: Knee)  Patient Location: PACU  Anesthesia Type:MAC combined with regional for post-op pain  Level of Consciousness: awake and alert   Airway & Oxygen Therapy: Patient Spontanous Breathing and Patient connected to nasal cannula oxygen  Post-op Assessment: Report given to RN and Post -op Vital signs reviewed and stable  Post vital signs: Reviewed and stable  Last Vitals:  Vitals Value Taken Time  BP 110/68 07/31/22 1653  Temp    Pulse 70 07/31/22 1655  Resp 20 07/31/22 1655  SpO2 93 % 07/31/22 1655  Vitals shown include unvalidated device data.  Last Pain:  Vitals:   07/31/22 1450  TempSrc:   PainSc: 0-No pain         Complications: No notable events documented.

## 2022-08-01 ENCOUNTER — Encounter (HOSPITAL_COMMUNITY): Payer: Self-pay | Admitting: Orthopedic Surgery

## 2022-08-01 DIAGNOSIS — I509 Heart failure, unspecified: Secondary | ICD-10-CM | POA: Diagnosis not present

## 2022-08-01 DIAGNOSIS — Z85828 Personal history of other malignant neoplasm of skin: Secondary | ICD-10-CM | POA: Diagnosis not present

## 2022-08-01 DIAGNOSIS — J45909 Unspecified asthma, uncomplicated: Secondary | ICD-10-CM | POA: Diagnosis not present

## 2022-08-01 DIAGNOSIS — I110 Hypertensive heart disease with heart failure: Secondary | ICD-10-CM | POA: Diagnosis not present

## 2022-08-01 DIAGNOSIS — E039 Hypothyroidism, unspecified: Secondary | ICD-10-CM | POA: Diagnosis not present

## 2022-08-01 DIAGNOSIS — M1711 Unilateral primary osteoarthritis, right knee: Secondary | ICD-10-CM | POA: Diagnosis not present

## 2022-08-01 LAB — BASIC METABOLIC PANEL
Anion gap: 8 (ref 5–15)
BUN: 17 mg/dL (ref 8–23)
CO2: 24 mmol/L (ref 22–32)
Calcium: 8.2 mg/dL — ABNORMAL LOW (ref 8.9–10.3)
Chloride: 101 mmol/L (ref 98–111)
Creatinine, Ser: 0.73 mg/dL (ref 0.44–1.00)
GFR, Estimated: >60 mL/min (ref >60)
Glucose, Bld: 151 mg/dL — ABNORMAL HIGH (ref 70–99)
Potassium: 4.1 mmol/L (ref 3.5–5.1)
Sodium: 133 mmol/L — ABNORMAL LOW (ref 135–145)

## 2022-08-01 LAB — CBC
HCT: 33 % — ABNORMAL LOW (ref 36.0–46.0)
Hemoglobin: 10.7 g/dL — ABNORMAL LOW (ref 12.0–15.0)
MCH: 29.8 pg (ref 26.0–34.0)
MCHC: 32.4 g/dL (ref 30.0–36.0)
MCV: 91.9 fL (ref 80.0–100.0)
Platelets: 237 10*3/uL (ref 150–400)
RBC: 3.59 MIL/uL — ABNORMAL LOW (ref 3.87–5.11)
RDW: 13.3 % (ref 11.5–15.5)
WBC: 10.2 10*3/uL (ref 4.0–10.5)
nRBC: 0 % (ref 0.0–0.2)

## 2022-08-01 MED ORDER — METHOCARBAMOL 500 MG PO TABS: 500.0000 mg | ORAL_TABLET | Freq: Four times a day (QID) | ORAL | 2 refills | Status: DC | PRN

## 2022-08-01 MED ORDER — SENNA 8.6 MG PO TABS: 2.0000 | ORAL_TABLET | Freq: Every day | ORAL | 0 refills | Status: DC

## 2022-08-01 MED ORDER — NITROFURANTOIN MONOHYD MACRO 100 MG PO CAPS: 100.0000 mg | ORAL_CAPSULE | Freq: Two times a day (BID) | ORAL | 0 refills | Status: AC

## 2022-08-01 MED ORDER — ASPIRIN 81 MG PO CHEW: 81.0000 mg | CHEWABLE_TABLET | Freq: Two times a day (BID) | ORAL | 0 refills | Status: AC

## 2022-08-01 MED ORDER — OXYCODONE HCL 5 MG PO TABS: 5.0000 mg | ORAL_TABLET | ORAL | 0 refills | Status: DC | PRN

## 2022-08-01 NOTE — TOC Transition Note (Signed)
Transition of Care [facility]) - CM/SW Discharge Note   Patient Details  Name: [name] MRN: [id number] Date of Birth: 07/15/1945  [facility] CM/SW Contact:  Amada Jupiter, LCSW Phone Number: 08/01/2022, 10:21 AM   Clinical Narrative:     Met with pt who confirms she has needed DME in the home.  OPPT already arranged with [facility]. No TOC needs.   Final next level of care: OP Rehab Barriers to Discharge: No Barriers Identified   Patient Goals and CMS Choice      Discharge Placement                         Discharge Plan and Services Additional resources added to the After Visit Summary for                  DME Arranged: CPM DME Agency: [employer]                  Social Determinants of Health (SDOH) Interventions SDOH Screenings   Food Insecurity: No Food Insecurity (07/31/2022)  Housing: Low Risk  (07/31/2022)  Transportation Needs: No Transportation Needs (07/31/2022)  Utilities: Not At Risk (07/31/2022)  Alcohol Screen: Low Risk  (01/30/2022)  Depression (PHQ2-9): Low Risk  (01/30/2022)  Financial Resource Strain: Low Risk  (01/30/2022)  Physical Activity: Inactive (01/30/2022)  Social Connections: Socially Integrated (01/30/2022)  Stress: No Stress Concern Present (01/30/2022)  Tobacco Use: Medium Risk (07/31/2022)     Readmission Risk Interventions     No data to display

## 2022-08-01 NOTE — Progress Notes (Signed)
Subjective: 1 Day Post-Op Procedure(s) (LRB): TOTAL KNEE ARTHROPLASTY (Right) Patient reports pain as mild.   Patient seen in rounds by Dr. Charlann Boxer. Patient is well, and has had no acute complaints or problems. No acute events overnight. Foley catheter removed. Patient has not been up with PT yet.  We will start therapy today.   Objective: Vital signs in last 24 hours: Temp:  [97.5 F (36.4 C)-98.5 F (36.9 C)] 98.4 F (36.9 C) (06/19 0620) Pulse Rate:  [65-75] 75 (06/19 0620) Resp:  [13-20] 17 (06/19 0620) BP: (101-134)/(46-79) 104/46 (06/19 0620) SpO2:  [94 %-100 %] 98 % (06/19 0620) Weight:  [79.8 kg] 79.8 kg (06/18 1152)  Intake/Output from previous day:  Intake/Output Summary (Last 24 hours) at 08/01/2022 0747 Last data filed at 08/01/2022 0600 Gross per 24 hour  Intake 2935.36 ml  Output 1670 ml  Net 1265.36 ml     Intake/Output this shift: No intake/output data recorded.  Labs: Recent Labs    08/01/22 0353  HGB 10.7*   Recent Labs    08/01/22 0353  WBC 10.2  RBC 3.59*  HCT 33.0*  PLT 237   Recent Labs    08/01/22 0353  NA 133*  K 4.1  CL 101  CO2 24  BUN 17  CREATININE 0.73  GLUCOSE 151*  CALCIUM 8.2*   No results for input(s): "LABPT", "INR" in the last 72 hours.  Exam: General - Patient is Alert and Oriented Extremity - Neurologically intact Sensation intact distally Intact pulses distally Dorsiflexion/Plantar flexion intact Dressing - dressing C/D/I Motor Function - intact, moving foot and toes well on exam.   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   Depressive disorder, not elsewhere  classified    Esophageal reflux    History of hiatal hernia    Hypertension    Localized osteoarthrosis not specified whether primary or secondary, lower leg    Other and unspecified hyperlipidemia    Other diseases of vocal cords    Peptic ulcer, unspecified site, unspecified as acute or chronic, without mention of hemorrhage, perforation, or obstruction    Pneumonia    bilat pneumonia 1987   Pre-diabetes    no meds,just diet controll   TMJ (temporomandibular joint disorder)    Unspecified arthropathy, hand    Unspecified asthma(493.90)    triggered with a virus    Unspecified hypothyroidism     Assessment/Plan: 1 Day Post-Op Procedure(s) (LRB): TOTAL KNEE ARTHROPLASTY (Right) Principal Problem:   S/P total knee arthroplasty, right  Estimated body mass index is 30.21 kg/m as calculated from the following:   Height as of this encounter: 5\' 4"  (1.626 m).   Weight as of this encounter: 79.8 kg. Advance diet Up with therapy D/C IV fluids   Patient's anticipated LOS is less than 2 midnights, meeting these requirements: - Younger than 67 - Lives within 1 hour of care - Has a competent adult at home to recover with post-op recover - NO history of  - Chronic pain requiring opiods  - Diabetes  -  Coronary Artery Disease  - Heart failure  - Heart attack  - Stroke  - DVT/VTE  - Cardiac arrhythmia  - Respiratory Failure/COPD  - Renal failure  - Anemia  - Advanced Liver disease     DVT Prophylaxis - Aspirin Weight bearing as tolerated.  Hgb stable at 10.7 this AM. Patient has CPM at home per her request.  Plan is to go Home after hospital stay. Plan for discharge today following 1-2 sessions of PT as long as they are meeting their goals. Patient is scheduled for OPPT. Follow up in the office in 2 weeks.   Dennie Bible, PA-C Orthopedic Surgery [phone] 08/01/2022, 7:47 AM

## 2022-08-01 NOTE — Plan of Care (Signed)
  Problem: Education: Goal: Knowledge of General Education information will improve Description: Including pain rating scale, medication(s)/side effects and non-pharmacologic comfort measures Outcome: Progressing   Problem: Activity: Goal: Risk for activity intolerance will decrease Outcome: Progressing   Problem: Pain Managment: Goal: General experience of comfort will improve Outcome: Progressing   

## 2022-08-01 NOTE — Evaluation (Signed)
Physical Therapy Evaluation Patient Details Name: [name] MRN: [id number] DOB: [birth year] Today's Date: 08/01/2022  History of Present Illness  77 yo female s/p R TKA 07/31/22. Hx of L TKA 2020, L THA 2023, R THA 2016, R RCR, vertigo, RA, DM  Clinical Impression  On eval, pt was Supv-Min guard A for mobility. She walked ~125 feet with a RW, and ascended/descended 1 curb step. Reviewed/practiced knee exercises-issued handout at pt's request. Daughter present during session. She is well versed with process and will be assisting pt throughout her rehab. Plan is for OPPT follow-up. All PT education completed.      Recommendations for follow up therapy are one component of a multi-disciplinary discharge planning process, led by the attending physician.  Recommendations may be updated based on patient status, additional functional criteria and insurance authorization.  Follow Up Recommendations       Assistance Recommended at Discharge PRN  Patient can return home with the following  Assistance with cooking/housework;Assist for transportation;Help with stairs or ramp for entrance    Equipment Recommendations None recommended by PT  Recommendations for Other Services       Functional Status Assessment Patient has had a recent decline in their functional status and demonstrates the ability to make significant improvements in function in a reasonable and predictable amount of time.     Precautions / Restrictions Precautions Precautions: Fall Restrictions Weight Bearing Restrictions: No RLE Weight Bearing: Weight bearing as tolerated      Mobility  Bed Mobility Overal bed mobility: Modified Independent                  Transfers Overall transfer level: Needs assistance Equipment used: Rolling walker (2 wheels) Transfers: Sit to/from Stand Sit to Stand: Supervision           General transfer comment: Supv for safety, hand placement.     Ambulation/Gait Ambulation/Gait assistance: Supervision Gait Distance (Feet): 125 Feet Assistive device: Rolling walker (2 wheels) Gait Pattern/deviations: Step-through pattern, Decreased stride length       General Gait Details: Supv for safety. Mild lightheadedness reported. No LOB. Pt tolerated distance well.  Stairs Stairs: Yes  Min guard A   Number of Stairs: 1 General stair comments: Cues for safety, technique, sequence. Min guard A. Family present to observe.  Wheelchair Mobility    Modified Rankin (Stroke Patients Only)       Balance Overall balance assessment: Needs assistance         Standing balance support: During functional activity, Bilateral upper extremity supported Standing balance-Leahy Scale: Fair                               Pertinent Vitals/Pain Pain Assessment Pain Assessment: 0-10 Pain Score: 3  Pain Location: R knee Pain Descriptors / Indicators: Discomfort, Sore Pain Intervention(s): Monitored during session, Ice applied, Repositioned    Home Living Family/patient expects to be discharged to:: Private residence Living Arrangements: Spouse/significant other Available Help at Discharge: Family;Available 24 hours/day Type of Home: House Home Access: Stairs to enter Entrance Stairs-Rails: None Entrance Stairs-Number of Steps: 1   Home Layout: Multi-level;Able to live on main level with bedroom/bathroom Home Equipment: Rolling Walker (2 wheels);BSC/3in1;Cane - single point;Shower seat      Prior Function Prior Level of Function : Independent/Modified Independent             Mobility Comments: using cane       Hand Dominance  Dominant Hand: Right    Extremity/Trunk Assessment   Upper Extremity Assessment Upper Extremity Assessment: Overall WFL for tasks assessed    Lower Extremity Assessment Lower Extremity Assessment: Generalized weakness    Cervical / Trunk Assessment Cervical / Trunk Assessment:  Normal  Communication   Communication: No difficulties  Cognition Arousal/Alertness: Awake/alert Behavior During Therapy: WFL for tasks assessed/performed Overall Cognitive Status: Within Functional Limits for tasks assessed                                          General Comments      Exercises Total Joint Exercises Ankle Circles/Pumps: AROM, Both, 10 reps Quad Sets: AROM, Both, 10 reps Hip ABduction/ADduction: AROM, Right, 10 reps Straight Leg Raises: AROM, Right, 10 reps Long Arc Quad: AROM, Right, 10 reps, Seated Knee Flexion: AROM, Right, 10 reps, Seated Goniometric ROM: ~5-95 degrees   Assessment/Plan    PT Assessment Patient needs continued PT services  PT Problem List Decreased strength;Decreased range of motion;Decreased activity tolerance;Decreased balance;Decreased mobility;Decreased knowledge of use of DME;Pain       PT Treatment Interventions DME instruction;Gait training;Therapeutic exercise;Balance training;Stair training;Functional mobility training;Therapeutic activities;Patient/family education    PT Goals (Current goals can be found in the Care Plan section)  Acute Rehab PT Goals Patient Stated Goal: home asap PT Goal Formulation: With patient/family Time For Goal Achievement: 08/15/22 Potential to Achieve Goals: Good    Frequency 7X/week     Co-evaluation               AM-PAC PT "6 Clicks" Mobility  Outcome Measure Help needed turning from your back to your side while in a flat bed without using bedrails?: None Help needed moving from lying on your back to sitting on the side of a flat bed without using bedrails?: None Help needed moving to and from a bed to a chair (including a wheelchair)?: None Help needed standing up from a chair using your arms (e.g., wheelchair or bedside chair)?: None Help needed to walk in hospital room?: A Little Help needed climbing 3-5 steps with a railing? : A Little 6 Click Score: 22     End of Session Equipment Utilized During Treatment: Gait belt Activity Tolerance: Patient tolerated treatment well Patient left: in chair;with call bell/phone within reach;with family/visitor present   PT Visit Diagnosis: Pain;Other abnormalities of gait and mobility (R26.89) Pain - Right/Left: Right Pain - part of body: Knee    Time: 0927-0953 PT Time Calculation (min) (ACUTE ONLY): 26 min   Charges:   PT Evaluation $PT Eval Low Complexity: 1 Low PT Treatments $Gait Training: 8-22 mins         Faye Ramsay, PT Acute Rehabilitation  Office: [phone]

## 2022-08-03 DIAGNOSIS — M25661 Stiffness of right knee, not elsewhere classified: Secondary | ICD-10-CM | POA: Diagnosis not present

## 2022-08-06 ENCOUNTER — Other Ambulatory Visit: Payer: Self-pay | Admitting: Family Medicine

## 2022-08-06 DIAGNOSIS — M25561 Pain in right knee: Secondary | ICD-10-CM | POA: Diagnosis not present

## 2022-08-06 NOTE — Discharge Summary (Signed)
Patient ID: [name] MRN: [id number] DOB/AGE: [birth year] 77 y.o.  Admit date: 07/31/2022 Discharge date: 08/01/2022  Admission Diagnoses:  Right knee osteoarthritis  Discharge Diagnoses:  Principal Problem:   S/P total knee arthroplasty, right   Past Medical History:  Diagnosis Date   Allergic rhinitis, cause unspecified    Anemia    iron   Anxiety    Asthma    Benign paroxysmal positional vertigo    Cancer (HCC)    basal cell carcinoma per right side of nostril   CHF (congestive heart failure) (HCC) 11/09/2016   had 2+ edema in legs with anxiety and SOB   Complication of anesthesia    has 4  degenerative discs in neck.   Depressive disorder, not elsewhere classified    Esophageal reflux    History of hiatal hernia    Hypertension    Localized osteoarthrosis not specified whether primary or secondary, lower leg    Other and unspecified hyperlipidemia    Other diseases of vocal cords    Peptic ulcer, unspecified site, unspecified as acute or chronic, without mention of hemorrhage, perforation, or obstruction    Pneumonia    bilat pneumonia 1987   Pre-diabetes    no meds,just diet controll   TMJ (temporomandibular joint disorder)    Unspecified arthropathy, hand    Unspecified asthma(493.90)    triggered with a virus    Unspecified hypothyroidism     Surgeries: Procedure(s): TOTAL KNEE ARTHROPLASTY on 07/31/2022   Consultants:   Discharged Condition: Improved  Hospital Course: [name] is an 78 y.o. female who was admitted 07/31/2022 for operative treatment ofS/P total knee arthroplasty, right. Patient has severe unremitting pain that affects sleep, daily activities, and work/hobbies. After pre-op clearance the patient was taken to the operating room on 07/31/2022 and underwent  Procedure(s): TOTAL KNEE ARTHROPLASTY.    Patient was given perioperative antibiotics:  Anti-infectives (From admission, onward)    Start     Dose/Rate Route Frequency  Ordered Stop   08/01/22 0000  nitrofurantoin, macrocrystal-monohydrate, (MACROBID) 100 MG capsule        100 mg Oral 2 times daily 08/01/22 0752 08/06/22 2359   07/31/22 2200  ceFAZolin (ANCEF) IVPB 2g/100 mL premix        2 g 200 mL/hr over 30 Minutes Intravenous Every 6 hours 07/31/22 1816 08/01/22 0500   07/31/22 1200  ceFAZolin (ANCEF) IVPB 2g/100 mL premix        2 g 200 mL/hr over 30 Minutes Intravenous On call to O.R. 07/31/22 1148 07/31/22 1557        Patient was given sequential compression devices, early ambulation, and chemoprophylaxis to prevent DVT. Patient worked with PT and was meeting their goals regarding safe ambulation and transfers.  Patient benefited maximally from hospital stay and there were no complications.    Recent vital signs: No data found.   Recent laboratory studies: No results for input(s): "WBC", "HGB", "HCT", "PLT", "NA", "K", "CL", "CO2", "BUN", "CREATININE", "GLUCOSE", "INR", "CALCIUM" in the last 72 hours.  Invalid input(s): "PT", "2"   Discharge Medications:   Allergies as of 08/01/2022       Reactions   Moxifloxacin Other (See Comments)   confusion, dizziness, paranoia   Other    Black pepper -stomach pain   Astelin [azelastine Hcl] Other (See Comments)   Headache   Celebrex [celecoxib] Other (See Comments)   Stomach issues   Lisinopril Cough   Methotrexate Derivatives Other (See Comments)   Flu like symptoms  Morphine Nausea And Vomiting        Medication List     STOP taking these medications    amoxicillin-clavulanate 875-125 MG tablet Commonly known as: AUGMENTIN       TAKE these medications    albuterol 108 (90 Base) MCG/ACT inhaler Commonly known as: VENTOLIN HFA INHALE 2 PUFFS INTO THE LUNGS EVERY 6 HOURS AS NEEDED FOR SHORTNESS OF BREATH   aspirin 81 MG chewable tablet Chew 1 tablet (81 mg total) by mouth 2 (two) times daily for 28 days.   buPROPion 300 MG 24 hr tablet Commonly known as: WELLBUTRIN  XL Take 300 mg by mouth daily.   CALCIUM 600+D3 PO Take 1 tablet by mouth 2 (two) times daily.   clonazePAM 0.5 MG tablet Commonly known as: KLONOPIN Take 0.125-0.25 mg by mouth daily as needed for anxiety.   famotidine 20 MG tablet Commonly known as: PEPCID Take 20 mg by mouth at bedtime.   fexofenadine 180 MG tablet Commonly known as: ALLEGRA Take 180 mg by mouth at bedtime.   GNP GINGKO BILOBA EXTRACT PO Take 1 tablet by mouth 3 (three) times daily with meals.   hydrochlorothiazide 25 MG tablet Commonly known as: HYDRODIURIL Take 1 tablet (25 mg total) by mouth daily.   lidocaine 5 % Commonly known as: Lidoderm Place 1 patch onto the skin daily. Remove & Discard patch within 12 hours or as directed by MD What changed:  when to take this reasons to take this   losartan 50 MG tablet Commonly known as: COZAAR TAKE 1 TABLET BY MOUTH EVERY DAY   Magnesium 100 MG Tabs Take 100 mg by mouth 2 (two) times a day.   meloxicam 15 MG tablet Commonly known as: MOBIC TAKE 1 TABLET BY MOUTH EVERY DAY IN THE MORNING   methocarbamol 500 MG tablet Commonly known as: ROBAXIN Take 1 tablet (500 mg total) by mouth every 6 (six) hours as needed for muscle spasms.   metoprolol succinate 25 MG 24 hr tablet Commonly known as: TOPROL-XL Take 1 tablet by mouth daily.   Mucinex Maximum Strength 1200 MG Tb12 Generic drug: Guaifenesin Take 1,200 mg by mouth 2 (two) times daily.   nitrofurantoin (macrocrystal-monohydrate) 100 MG capsule Commonly known as: Macrobid Take 1 capsule (100 mg total) by mouth 2 (two) times daily for 5 days. For UTI   OVER THE COUNTER MEDICATION Apply 1 Application topically 2 (two) times daily. CBD roll on pain reliever   oxyCODONE 5 MG immediate release tablet Commonly known as: Oxy IR/ROXICODONE Take 1 tablet (5 mg total) by mouth every 4 (four) hours as needed for severe pain.   PARoxetine 12.5 MG 24 hr tablet Commonly known as: Paxil CR Take 1  tablet (12.5 mg total) by mouth daily.   polyethylene glycol 17 g packet Commonly known as: MIRALAX / GLYCOLAX Take 17 g by mouth daily as needed for mild constipation. What changed: how much to take   senna 8.6 MG Tabs tablet Commonly known as: SENOKOT Take 2 tablets (17.2 mg total) by mouth at bedtime.   Symbicort 80-4.5 MCG/ACT inhaler Generic drug: budesonide-formoterol TAKE 2 PUFFS BY MOUTH TWICE A DAY   Synthroid 75 MCG tablet Generic drug: levothyroxine Take 1 tablet (75 mcg total) by mouth daily before breakfast.   Systane Complete PF 0.6 % Soln Generic drug: Propylene Glycol (PF) Place 1 drop into both eyes 2 (two) times daily.   TART CHERRY PO Take 2 capsules by mouth daily.   vitamin  C with rose hips 1000 MG tablet Take 1,000 mg by mouth daily.   Vitamin D3 50 MCG (2000 UT) capsule Take 2,000 Units by mouth daily.               Discharge Care Instructions  (From admission, onward)           Start     Ordered   08/01/22 0000  Change dressing       Comments: Maintain surgical dressing until follow up in the clinic. If the edges start to pull up, may reinforce with tape. If the dressing is no longer working, may remove and cover with gauze and tape, but must keep the area dry and clean.  Call with any questions or concerns.   08/01/22 0752            Diagnostic Studies: No results found.  Disposition: Discharge disposition: 01-Home or Self Care       Discharge Instructions     Call MD / Call 911   Complete by: As directed    If you experience chest pain or shortness of breath, CALL 911 and be transported to the hospital emergency room.  If you develope a fever above 101 F, pus (white drainage) or increased drainage or redness at the wound, or calf pain, call your surgeon's office.   Change dressing   Complete by: As directed    Maintain surgical dressing until follow up in the clinic. If the edges start to pull up, may reinforce with  tape. If the dressing is no longer working, may remove and cover with gauze and tape, but must keep the area dry and clean.  Call with any questions or concerns.   Constipation Prevention   Complete by: As directed    Drink plenty of fluids.  Prune juice may be helpful.  You may use a stool softener, such as Colace (over the counter) 100 mg twice a day.  Use MiraLax (over the counter) for constipation as needed.   Diet - low sodium heart healthy   Complete by: As directed    Increase activity slowly as tolerated   Complete by: As directed    Weight bearing as tolerated with assist device (walker, cane, etc) as directed, use it as long as suggested by your surgeon or therapist, typically at least 4-6 weeks.   Post-operative opioid taper instructions:   Complete by: As directed    POST-OPERATIVE OPIOID TAPER INSTRUCTIONS: It is important to wean off of your opioid medication as soon as possible. If you do not need pain medication after your surgery it is ok to stop day one. Opioids include: Codeine, Hydrocodone(Norco, Vicodin), Oxycodone(Percocet, oxycontin) and hydromorphone amongst others.  Long term and even short term use of opiods can cause: Increased pain response Dependence Constipation Depression Respiratory depression And more.  Withdrawal symptoms can include Flu like symptoms Nausea, vomiting And more Techniques to manage these symptoms Hydrate well Eat regular healthy meals Stay active Use relaxation techniques(deep breathing, meditating, yoga) Do Not substitute Alcohol to help with tapering If you have been on opioids for less than two weeks and do not have pain than it is ok to stop all together.  Plan to wean off of opioids This plan should start within one week post op of your joint replacement. Maintain the same interval or time between taking each dose and first decrease the dose.  Cut the total daily intake of opioids by one tablet each day Next start to  increase  the time between doses. The last dose that should be eliminated is the evening dose.      TED hose   Complete by: As directed    Use stockings (TED hose) for 2 weeks on both leg(s).  You may remove them at night for sleeping.        Follow-up Information     Durene Romans, MD. Go on 08/20/2022.   Specialty: Orthopedic Surgery Why: You are scheduled for first post op appt on Monday July 8 at 10:45am. Contact information: [address] [place] [address] [phone]                  Signed: Cassandria Anger 08/06/2022, 1:42 PM

## 2022-08-08 DIAGNOSIS — M25561 Pain in right knee: Secondary | ICD-10-CM | POA: Diagnosis not present

## 2022-08-09 ENCOUNTER — Ambulatory Visit (INDEPENDENT_AMBULATORY_CARE_PROVIDER_SITE_OTHER): Payer: Medicare Other | Admitting: Family Medicine

## 2022-08-09 DIAGNOSIS — N3000 Acute cystitis without hematuria: Secondary | ICD-10-CM | POA: Diagnosis not present

## 2022-08-09 DIAGNOSIS — R3915 Urgency of urination: Secondary | ICD-10-CM | POA: Diagnosis not present

## 2022-08-09 LAB — POCT URINALYSIS DIP (CLINITEK)
Bilirubin, UA: NEGATIVE
Blood, UA: NEGATIVE
Glucose, UA: NEGATIVE mg/dL
Nitrite, UA: NEGATIVE
POC PROTEIN,UA: 100 — AB
Spec Grav, UA: >=1.03 — AB (ref 1.010–1.025)
Urobilinogen, UA: 0.2 E.U./dL
pH, UA: 5.5 (ref 5.0–8.0)

## 2022-08-09 MED ORDER — SULFAMETHOXAZOLE-TRIMETHOPRIM 800-160 MG PO TABS
1.0000 | ORAL_TABLET | Freq: Two times a day (BID) | ORAL | 0 refills | Status: DC
Start: 2022-08-09 — End: 2022-08-24

## 2022-08-09 NOTE — Progress Notes (Signed)
Acute Office Visit  Subjective:     Patient ID: [name], female    DOB: 18-Jul-1945, 77 y.o.   MRN: [id number]  Chief Complaint  Patient presents with   Urinary Tract Infection    HPI Patient is in today for follow-up recent UTI.  Urine culture on May 21 was positive for Klebsiella.  Mostly pansensitive.  Treated with Augmentin.  She then underwent right knee replacement on June 18 and had a catheter in place during surgery.  Started having symptoms after surgery and was treated by Dr. Constance Goltz with an antibiotic, Macrobid.  She was experiencing urinary frequency urgency and a fever up to 102.  She still having some low-grade fevers around 99 but not as high as they were the urinary frequency has improved but not completely resolved she normally gets up once at night and she is been getting up more frequently.  Just wants to make sure infection has cleared.    ROS      Objective:    There were no vitals taken for this visit.   Physical Exam Vitals reviewed.  Constitutional:      Appearance: She is well-developed.  HENT:     Head: Normocephalic and atraumatic.  Eyes:     Conjunctiva/sclera: Conjunctivae normal.  Cardiovascular:     Rate and Rhythm: Normal rate.  Pulmonary:     Effort: Pulmonary effort is normal.  Skin:    General: Skin is dry.     Coloration: Skin is not pale.     Comments: Is have significant swelling and some mild erythema laterally around her right knee.  She has a lot of bruising over the lower thigh.  She does have her compression stockings in place.  Neurological:     Mental Status: She is alert and oriented to person, place, and time.  Psychiatric:        Behavior: Behavior normal.     Results for orders placed or performed in visit on 08/09/22  POCT URINALYSIS DIP (CLINITEK)  Result Value Ref Range   Color, UA yellow yellow   Clarity, UA clear clear   Glucose, UA negative negative mg/dL   Bilirubin, UA negative negative   Ketones,  POC UA trace (5) (A) negative mg/dL   Spec Grav, UA >=7.829 (A) 1.010 - 1.025   Blood, UA negative negative   pH, UA 5.5 5.0 - 8.0   POC PROTEIN,UA =100 (A) negative, trace   Urobilinogen, UA 0.2 0.2 or 1.0 E.U./dL   Nitrite, UA Negative Negative   Leukocytes, UA Trace (A) Negative        Assessment & Plan:   Problem List Items Addressed This Visit   None Visit Diagnoses     Urinary urgency    -  Primary   Relevant Medications   sulfamethoxazole-trimethoprim (BACTRIM DS) 800-160 MG tablet   Other Relevant Orders   POCT URINALYSIS DIP (CLINITEK) (Completed)   Urine Culture   Acute cystitis without hematuria       Relevant Medications   sulfamethoxazole-trimethoprim (BACTRIM DS) 800-160 MG tablet   Other Relevant Orders   Urine Culture      Acute cystitis-urinalysis today is still showing protein and leukocytes but negative for nitrites but she is still having some low-grade symptoms some going to go ahead and put her on Bactrim while awaiting a culture.  Will call with results once available.  Call if any new or worsening symptoms.  Meds ordered this encounter  Medications  sulfamethoxazole-trimethoprim (BACTRIM DS) 800-160 MG tablet    Sig: Take 1 tablet by mouth 2 (two) times daily.    Dispense:  10 tablet    Refill:  0    No follow-ups on file.  Nani Gasser, MD

## 2022-08-10 DIAGNOSIS — M25561 Pain in right knee: Secondary | ICD-10-CM | POA: Diagnosis not present

## 2022-08-10 DIAGNOSIS — M25661 Stiffness of right knee, not elsewhere classified: Secondary | ICD-10-CM | POA: Diagnosis not present

## 2022-08-13 ENCOUNTER — Telehealth: Payer: Self-pay | Admitting: Family Medicine

## 2022-08-13 DIAGNOSIS — M25561 Pain in right knee: Secondary | ICD-10-CM | POA: Diagnosis not present

## 2022-08-13 NOTE — Telephone Encounter (Signed)
OK, that sounds perfect.!!!!

## 2022-08-13 NOTE — Telephone Encounter (Signed)
If she is still having symptoms then yes we need to get a urine sample I am not sure why she was not able to drop it off on Friday.  But she is welcome to drop it off any day this week except for Thursday when we are closed.  See office visit note from last week.

## 2022-08-13 NOTE — Telephone Encounter (Signed)
Patient called and ask if she needs to send over urine sample she hasn't started her antibiotics yet please advise

## 2022-08-14 DIAGNOSIS — F410 Panic disorder [episodic paroxysmal anxiety] without agoraphobia: Secondary | ICD-10-CM | POA: Diagnosis not present

## 2022-08-14 DIAGNOSIS — F422 Mixed obsessional thoughts and acts: Secondary | ICD-10-CM | POA: Diagnosis not present

## 2022-08-14 DIAGNOSIS — R3915 Urgency of urination: Secondary | ICD-10-CM | POA: Diagnosis not present

## 2022-08-14 DIAGNOSIS — N3000 Acute cystitis without hematuria: Secondary | ICD-10-CM | POA: Diagnosis not present

## 2022-08-14 DIAGNOSIS — F902 Attention-deficit hyperactivity disorder, combined type: Secondary | ICD-10-CM | POA: Diagnosis not present

## 2022-08-15 DIAGNOSIS — M25661 Stiffness of right knee, not elsewhere classified: Secondary | ICD-10-CM | POA: Diagnosis not present

## 2022-08-16 LAB — URINE CULTURE
MICRO NUMBER:: 15157448
Result:: NO GROWTH
SPECIMEN QUALITY:: ADEQUATE

## 2022-08-17 DIAGNOSIS — M25661 Stiffness of right knee, not elsewhere classified: Secondary | ICD-10-CM | POA: Diagnosis not present

## 2022-08-17 NOTE — Progress Notes (Signed)
HI [name] news!!!!  Urine culture is negative so the infection has cleared up.  I hope you are doing well.

## 2022-08-20 DIAGNOSIS — M25661 Stiffness of right knee, not elsewhere classified: Secondary | ICD-10-CM | POA: Diagnosis not present

## 2022-08-22 ENCOUNTER — Telehealth: Payer: Self-pay | Admitting: Adult Health

## 2022-08-22 NOTE — Telephone Encounter (Signed)
Pt has a naggy cough and states she takes the tessalon pearls and when went to get refill the pharmacy said they couldn't fill it

## 2022-08-23 ENCOUNTER — Telehealth: Payer: Self-pay

## 2022-08-23 ENCOUNTER — Other Ambulatory Visit: Payer: Self-pay

## 2022-08-23 DIAGNOSIS — M25661 Stiffness of right knee, not elsewhere classified: Secondary | ICD-10-CM | POA: Diagnosis not present

## 2022-08-23 DIAGNOSIS — E611 Iron deficiency: Secondary | ICD-10-CM

## 2022-08-23 NOTE — Telephone Encounter (Signed)
Received phone call from patient who stated that since her knee surgery she has been having fatigue. Pt requesting to have labs drawn to check her iron levels. Orders placed and pt aware scheduling will call to set her up for a labs only appointment. Upon seeing her results a determination will be made to see if patient needs IV iron.  Pt verbalized understanding and had no further questions.

## 2022-08-24 ENCOUNTER — Encounter: Payer: Self-pay | Admitting: Family Medicine

## 2022-08-24 ENCOUNTER — Ambulatory Visit (INDEPENDENT_AMBULATORY_CARE_PROVIDER_SITE_OTHER): Payer: Medicare Other | Admitting: Family Medicine

## 2022-08-24 VITALS — BP 105/52 | HR 76 | Temp 98.5°F | Ht 61.0 in | Wt 177.0 lb

## 2022-08-24 DIAGNOSIS — R051 Acute cough: Secondary | ICD-10-CM

## 2022-08-24 DIAGNOSIS — F422 Mixed obsessional thoughts and acts: Secondary | ICD-10-CM

## 2022-08-24 DIAGNOSIS — U071 COVID-19: Secondary | ICD-10-CM

## 2022-08-24 LAB — POC COVID19 BINAXNOW: SARS Coronavirus 2 Ag: POSITIVE — AB

## 2022-08-24 MED ORDER — PAXIL CR 25 MG PO TB24: 25.0000 mg | ORAL_TABLET | Freq: Every day | ORAL | 3 refills | Status: DC

## 2022-08-24 MED ORDER — NIRMATRELVIR/RITONAVIR (PAXLOVID)TABLET: 3.0000 | ORAL_TABLET | Freq: Two times a day (BID) | ORAL | 0 refills | Status: AC

## 2022-08-24 NOTE — Progress Notes (Signed)
   Established Patient Office Visit  Subjective   Patient ID: [name], female    DOB: January 29, 1946  Age: 77 y.o. MRN: [id number]  No chief complaint on file.   HPI  Pt would like to be tested for Covid.   She reports that she has been achy and has had a cough with a temp of 101.7 last night. Her husband was her a few days ago and tested positive.   Her main reason for the visit today is to get her Paxil covered. She would like for Dr. Linford Arnold to change her prescription to 25 mg. She said that she was on this strength years ago and would like to try it again.  He says that the generic just does not work as well she feels like she still has significant exacerbations of her anxiety on the generic and would like to go back to the brand which she is always done really well with.      ROS    Objective:     BP (!) 105/52   Pulse 76   Temp 98.5 F (36.9 C)   Ht 5\' 1"  (1.549 m)   Wt 177 lb (80.3 kg)   SpO2 99%   BMI 33.44 kg/m    Physical Exam Vitals reviewed.  Constitutional:      Appearance: She is well-developed.  HENT:     Head: Normocephalic and atraumatic.  Eyes:     Conjunctiva/sclera: Conjunctivae normal.  Cardiovascular:     Rate and Rhythm: Normal rate.  Pulmonary:     Effort: Pulmonary effort is normal.  Skin:    General: Skin is dry.     Coloration: Skin is not pale.  Neurological:     Mental Status: She is alert and oriented to person, place, and time.  Psychiatric:        Behavior: Behavior normal.      Results for orders placed or performed in visit on 08/24/22  POC COVID-19  Result Value Ref Range   SARS Coronavirus 2 Ag Positive (A) Negative      The 10-year ASCVD risk score (Arnett DK, et al., 2019) is: 35.4%    Assessment & Plan:   Problem List Items Addressed This Visit       Other   OCD (obsessive compulsive disorder)    Her OCD and anxiety will switch to brand Paxil the generic has just not been nearly as effective.  New  prescription sent to mail order I do suspect it may require prior authorization so just encouraged her to let us know if that information is communicated to her so that we can reach out to the insurance company.      Other Visit Diagnoses     Acute cough    -  Primary   Relevant Orders   POC COVID-19 (Completed)   COVID-19       Relevant Medications   nirmatrelvir/ritonavir (PAXLOVID) 20 x 150 MG & 10 x 100MG  TABS      COVID-19-will treat with Paxlovid.  This is actually the first time that she has had COVID.  Treat symptomatic care call if any new or worsening symptoms.  She is able to speak without any difficulty breathing.    No follow-ups on file.    Nani Gasser, MD

## 2022-08-24 NOTE — Patient Instructions (Signed)
If you hear from the pharmacy that your Paxil needs any type of extra authorization please let us know and we will contact your insurance company.

## 2022-08-24 NOTE — Progress Notes (Signed)
Pt would like to be tested for Covid.  She reports that she has been achy and has had a cough with a temp of 101.7 last night. Her husband was her a few days ago and tested positive.   Her main reason for the visit today is to get her Paxil covered. She would like for Dr. Linford Arnold to change her prescription to 25 mg. She said that she was on this strength years ago and would like to try it again.

## 2022-08-24 NOTE — Addendum Note (Signed)
Addended by: Nani Gasser D on: 08/24/2022 04:36 PM   Modules accepted: Orders

## 2022-08-24 NOTE — Telephone Encounter (Signed)
PT calling and needs Symbicort as well as the tesslon pearls  [facility] [address] in [place].

## 2022-08-24 NOTE — Assessment & Plan Note (Signed)
Her OCD and anxiety will switch to brand Paxil the generic has just not been nearly as effective.  New prescription sent to mail order I do suspect it may require prior authorization so just encouraged her to let us know if that information is communicated to her so that we can reach out to the insurance company.

## 2022-08-27 ENCOUNTER — Other Ambulatory Visit: Payer: Self-pay

## 2022-08-27 ENCOUNTER — Other Ambulatory Visit (HOSPITAL_BASED_OUTPATIENT_CLINIC_OR_DEPARTMENT_OTHER): Payer: Self-pay | Admitting: Pulmonary Disease

## 2022-08-27 MED ORDER — BUDESONIDE-FORMOTEROL FUMARATE 80-4.5 MCG/ACT IN AERO: 2.0000 | INHALATION_SPRAY | Freq: Two times a day (BID) | RESPIRATORY_TRACT | 3 refills | Status: DC

## 2022-08-27 MED ORDER — BENZONATATE 200 MG PO CAPS: 200.0000 mg | ORAL_CAPSULE | Freq: Three times a day (TID) | ORAL | 1 refills | Status: DC | PRN

## 2022-08-27 NOTE — Telephone Encounter (Signed)
Pt. Calling back  she till having cough and needs her SYMBICORT 80-4.5 MCG/ACT inhaler  and tessalon pearls called  [facility] [address]

## 2022-08-27 NOTE — Telephone Encounter (Signed)
Spoke with patient. Advised Symbicort and Tessalon have been sent to pharmacy. NFN

## 2022-08-29 ENCOUNTER — Telehealth: Payer: Self-pay | Admitting: Pulmonary Disease

## 2022-08-29 NOTE — Telephone Encounter (Signed)
Patient states Symbicort needs prior authorization. Pharmacy is [facility] [place]. Patient phone number is [phone].

## 2022-08-30 ENCOUNTER — Telehealth: Payer: Self-pay

## 2022-08-30 ENCOUNTER — Other Ambulatory Visit (HOSPITAL_COMMUNITY): Payer: Self-pay

## 2022-08-30 ENCOUNTER — Encounter: Payer: Self-pay | Admitting: Family

## 2022-08-30 NOTE — Telephone Encounter (Signed)
PA sent to plan and has been approved!

## 2022-08-30 NOTE — Telephone Encounter (Signed)
*  Pulm  PA request received for Budesonide-Formoterol Fumarate 80-4.5MCG/ACT aerosol  PA submitted to [facility] via CMM and has been APPROVED from 08/30/2022-08/30/2023  Key: [id number]

## 2022-08-30 NOTE — Telephone Encounter (Signed)
Can pt get a prior auth for symbicort

## 2022-08-31 ENCOUNTER — Encounter: Payer: Self-pay | Admitting: *Deleted

## 2022-08-31 DIAGNOSIS — M25661 Stiffness of right knee, not elsewhere classified: Secondary | ICD-10-CM | POA: Diagnosis not present

## 2022-08-31 NOTE — Telephone Encounter (Signed)
Mychart msg to pt letting her know

## 2022-09-03 ENCOUNTER — Inpatient Hospital Stay: Payer: Medicare Other | Attending: Hematology & Oncology

## 2022-09-03 DIAGNOSIS — E611 Iron deficiency: Secondary | ICD-10-CM

## 2022-09-03 DIAGNOSIS — D509 Iron deficiency anemia, unspecified: Secondary | ICD-10-CM | POA: Insufficient documentation

## 2022-09-03 DIAGNOSIS — M25661 Stiffness of right knee, not elsewhere classified: Secondary | ICD-10-CM | POA: Diagnosis not present

## 2022-09-03 LAB — CBC WITH DIFFERENTIAL (CANCER CENTER ONLY)
Abs Immature Granulocytes: 0.07 10*3/uL (ref 0.00–0.07)
Basophils Absolute: 0 10*3/uL (ref 0.0–0.1)
Basophils Relative: 1 %
Eosinophils Absolute: 0.2 10*3/uL (ref 0.0–0.5)
Eosinophils Relative: 3 %
HCT: 37.1 % (ref 36.0–46.0)
Hemoglobin: 11.9 g/dL — ABNORMAL LOW (ref 12.0–15.0)
Immature Granulocytes: 1 %
Lymphocytes Relative: 19 %
Lymphs Abs: 1.2 10*3/uL (ref 0.7–4.0)
MCH: 29.2 pg (ref 26.0–34.0)
MCHC: 32.1 g/dL (ref 30.0–36.0)
MCV: 90.9 fL (ref 80.0–100.0)
Monocytes Absolute: 0.6 10*3/uL (ref 0.1–1.0)
Monocytes Relative: 10 %
Neutro Abs: 4.2 10*3/uL (ref 1.7–7.7)
Neutrophils Relative %: 66 %
Platelet Count: 310 10*3/uL (ref 150–400)
RBC: 4.08 MIL/uL (ref 3.87–5.11)
RDW: 13.6 % (ref 11.5–15.5)
WBC Count: 6.4 10*3/uL (ref 4.0–10.5)
nRBC: 0 % (ref 0.0–0.2)

## 2022-09-03 LAB — RETICULOCYTES
Immature Retic Fract: 9.7 % (ref 2.3–15.9)
RBC.: 4.13 MIL/uL (ref 3.87–5.11)
Retic Count, Absolute: 94.2 10*3/uL (ref 19.0–186.0)
Retic Ct Pct: 2.3 % (ref 0.4–3.1)

## 2022-09-03 LAB — IRON AND IRON BINDING CAPACITY (CC-WL,HP ONLY)
Iron: 75 ug/dL (ref 28–170)
Saturation Ratios: 23 % (ref 10.4–31.8)
TIBC: 332 ug/dL (ref 250–450)
UIBC: 257 ug/dL (ref 148–442)

## 2022-09-03 LAB — FERRITIN: Ferritin: 218 ng/mL (ref 11–307)

## 2022-09-06 DIAGNOSIS — M25661 Stiffness of right knee, not elsewhere classified: Secondary | ICD-10-CM | POA: Diagnosis not present

## 2022-09-07 ENCOUNTER — Other Ambulatory Visit: Payer: Self-pay | Admitting: Family Medicine

## 2022-09-10 DIAGNOSIS — M25661 Stiffness of right knee, not elsewhere classified: Secondary | ICD-10-CM | POA: Diagnosis not present

## 2022-09-12 DIAGNOSIS — Z96651 Presence of right artificial knee joint: Secondary | ICD-10-CM | POA: Diagnosis not present

## 2022-09-12 DIAGNOSIS — Z471 Aftercare following joint replacement surgery: Secondary | ICD-10-CM | POA: Diagnosis not present

## 2022-09-12 DIAGNOSIS — M25661 Stiffness of right knee, not elsewhere classified: Secondary | ICD-10-CM | POA: Diagnosis not present

## 2022-09-13 ENCOUNTER — Telehealth: Payer: Self-pay | Admitting: Family Medicine

## 2022-09-13 NOTE — Telephone Encounter (Signed)
Patient called she says the pharmacy can't find her order for Paxil Cr25mg  she is requesting to reorder it and send to  [facility] she only 5 days left of the other medication Phone [phone] Please call patient to confirm

## 2022-09-17 DIAGNOSIS — M25561 Pain in right knee: Secondary | ICD-10-CM | POA: Diagnosis not present

## 2022-09-17 NOTE — Telephone Encounter (Signed)
FYI: Pt called. She states "out of the blue" Paxil CR 12.5mg  came via mail order after telling her they could not get it.  She apologizes for getting Dr Linford Arnold involved because script was prescribed by her counselor.

## 2022-09-18 DIAGNOSIS — F410 Panic disorder [episodic paroxysmal anxiety] without agoraphobia: Secondary | ICD-10-CM | POA: Diagnosis not present

## 2022-09-18 DIAGNOSIS — F902 Attention-deficit hyperactivity disorder, combined type: Secondary | ICD-10-CM | POA: Diagnosis not present

## 2022-09-18 DIAGNOSIS — F422 Mixed obsessional thoughts and acts: Secondary | ICD-10-CM | POA: Diagnosis not present

## 2022-09-20 ENCOUNTER — Ambulatory Visit (INDEPENDENT_AMBULATORY_CARE_PROVIDER_SITE_OTHER): Payer: Medicare Other | Admitting: Family Medicine

## 2022-09-20 ENCOUNTER — Encounter: Payer: Self-pay | Admitting: Family Medicine

## 2022-09-20 VITALS — BP 134/71 | HR 88 | Ht 62.0 in | Wt 175.0 lb

## 2022-09-20 DIAGNOSIS — M5431 Sciatica, right side: Secondary | ICD-10-CM

## 2022-09-20 MED ORDER — PREDNISONE 20 MG PO TABS
40.0000 mg | ORAL_TABLET | Freq: Every day | ORAL | 0 refills | Status: DC
Start: 2022-09-20 — End: 2022-11-07

## 2022-09-20 NOTE — Progress Notes (Signed)
Acute Office Visit  Subjective:     Patient ID: [name], female    DOB: [birth year], 77 y.o.   MRN: [id number]  Chief Complaint  Patient presents with   Sciatica    Pt c/o R sided sciatica. She said that it started after she was seen by her chiropractor about 1 week ago. The pain goes from her low back to her R buttock travels to the front of her R thigh down the lateral side of her calf to her ankle. Pt had surgery on her R knee and has continued to do PT and doesn't feel that this pain has anything to do with the recent surgery.     HPI Patient is in today for   Pt c/o R sided sciatica. She said that it started after she was seen by her chiropractor about 1 week ago. The pain goes from her low back to her R buttock travels to the front of her R thigh down the lateral side of her calf to her ankle. Pt had surgery on her R knee and has continued to do PT and doesn't feel that this pain has anything to do with the recent surgery..  She says she has had sciatica before and it does feel similar though this is a little bit worse.  She is using a lidocaine patch which helps a little bit on her back.  She tried to cut up another lidocaine patch and put it on her leg but it would not stick.  She says Tylenol is not helping at all and the ibuprofen makes her stomach hurt.  She did take 1 with some food but does not really want to take it anymore.      ROS      Objective:    BP 134/71   Pulse 88   Ht 5\' 2"  (1.575 m)   Wt 175 lb (79.4 kg)   SpO2 99%   BMI 32.01 kg/m    Physical Exam Vitals reviewed.  Constitutional:      Appearance: She is well-developed.  HENT:     Head: Normocephalic and atraumatic.  Eyes:     Conjunctiva/sclera: Conjunctivae normal.  Cardiovascular:     Rate and Rhythm: Normal rate.  Pulmonary:     Effort: Pulmonary effort is normal.  Musculoskeletal:     Comments: Tender to the right of the lumbar spine near the SI joint.  Tender towards the outer  hip.  Hip, knee, ankle strength is 5 out of 5 bilaterally.  Absent patellar reflex but she has had right knee replacement.  2+ on the left.  Skin:    General: Skin is dry.     Coloration: Skin is not pale.  Neurological:     Mental Status: She is alert and oriented to person, place, and time.  Psychiatric:        Behavior: Behavior normal.     No results found for any visits on 09/20/22.      Assessment & Plan:   Problem List Items Addressed This Visit   None Visit Diagnoses     Right sided sciatica    -  Primary      Low back pain with right-sided sciatica-will treat with acute dose of prednisone for 5 days.  Gave her exercises to do in particular the cat and the camel.  If not improving please let us know.  Continue with lidocaine patch heat and/or ice.   Meds ordered this encounter  Medications  predniSONE (DELTASONE) 20 MG tablet    Sig: Take 2 tablets (40 mg total) by mouth daily with breakfast.    Dispense:  10 tablet    Refill:  0    Return in about 5 weeks (around 10/26/2022) for Thyroid and A1C .  Nani Gasser, MD

## 2022-09-24 DIAGNOSIS — M25661 Stiffness of right knee, not elsewhere classified: Secondary | ICD-10-CM | POA: Diagnosis not present

## 2022-10-01 DIAGNOSIS — Z950 Presence of cardiac pacemaker: Secondary | ICD-10-CM | POA: Diagnosis not present

## 2022-10-01 DIAGNOSIS — I10 Essential (primary) hypertension: Secondary | ICD-10-CM | POA: Diagnosis not present

## 2022-10-01 DIAGNOSIS — I498 Other specified cardiac arrhythmias: Secondary | ICD-10-CM | POA: Diagnosis not present

## 2022-10-01 DIAGNOSIS — I471 Supraventricular tachycardia, unspecified: Secondary | ICD-10-CM | POA: Diagnosis not present

## 2022-10-02 DIAGNOSIS — M25561 Pain in right knee: Secondary | ICD-10-CM | POA: Diagnosis not present

## 2022-10-16 DIAGNOSIS — F902 Attention-deficit hyperactivity disorder, combined type: Secondary | ICD-10-CM | POA: Diagnosis not present

## 2022-10-16 DIAGNOSIS — F422 Mixed obsessional thoughts and acts: Secondary | ICD-10-CM | POA: Diagnosis not present

## 2022-10-16 DIAGNOSIS — F410 Panic disorder [episodic paroxysmal anxiety] without agoraphobia: Secondary | ICD-10-CM | POA: Diagnosis not present

## 2022-11-01 ENCOUNTER — Encounter: Payer: Self-pay | Admitting: Adult Health

## 2022-11-01 ENCOUNTER — Ambulatory Visit (INDEPENDENT_AMBULATORY_CARE_PROVIDER_SITE_OTHER): Payer: Medicare Other | Admitting: Adult Health

## 2022-11-01 VITALS — BP 108/76 | HR 87 | Temp 98.1°F | Ht 62.0 in | Wt 178.4 lb

## 2022-11-01 DIAGNOSIS — J4530 Mild persistent asthma, uncomplicated: Secondary | ICD-10-CM | POA: Diagnosis not present

## 2022-11-01 DIAGNOSIS — J309 Allergic rhinitis, unspecified: Secondary | ICD-10-CM

## 2022-11-01 NOTE — Patient Instructions (Addendum)
Symbicort 2 puffs daily, rinse after use.  Albuterol inhaler As needed   Saline nasal rinses As needed   Continue on Allegra daily  Delsym 2 tsp Twice daily  As needed  Cough .  Tessalon Three times a day  for cough As needed   Flu shot this fall as planned.  Follow up Dr. Vassie Loll  or Allaina Brotzman NP 6 months PFT and As needed   Please contact office for sooner follow up if symptoms do not improve or worsen or seek emergency care

## 2022-11-01 NOTE — Progress Notes (Signed)
@Patient  ID: [name], female    DOB: [birth year], 77 y.o.   MRN: [id number]  Chief Complaint  Patient presents with   Follow-up    Referring provider: Agapito Games, *  HPI: 77 year old female followed for asthma, allergic rhinitis and chronic cough Medical history significant for rheumatoid arthritis and osteoarthritis previously on methotrexate.  She has iron deficiency anemia.  TEST/EVENTS :  Spirometry 06/2013 ratio 63 FEV1 normal   11/01/2022 Follow up : Asthma , [place] , Chronic cough  Patient returns for a 62-month follow-up.  Patient was seen last visit for slow to resolve community-acquired pneumonia and asthmatic bronchitis.Marland Kitchen  She was treated with empiric antibiotics and prednisone.  Patient says she is doing better.  She is back to baseline.  Now is back to using Symbicort 2 puffs once daily.  She denies any flare of cough or wheezing.  Follow-up chest x-ray Jul 06, 2022 showed clearance of her pneumonia. She says she remains active with her family.  Helps with her grandchildren. She continues on Allegra daily.  Feels that this is working with her allergies.  She is getting her flu shot next month.     Allergies  Allergen Reactions   Moxifloxacin Other (See Comments)    confusion, dizziness, paranoia   Other     Black pepper -stomach pain   Astelin [Azelastine Hcl] Other (See Comments)    Headache   Celebrex [Celecoxib] Other (See Comments)    Stomach issues   Lisinopril Cough   Methotrexate Derivatives Other (See Comments)    Flu like symptoms    Morphine Nausea And Vomiting    Immunization History  Administered Date(s) Administered   Fluad Quad(high Dose 65+) 12/24/2018, 12/28/2019, 12/14/2020, 01/29/2022   Influenza Split 11/07/2011, 01/12/2013   Influenza Whole 02/12/2005, 12/02/2007, 11/24/2008   Influenza, High Dose Seasonal PF 11/19/2016, 12/10/2017   Influenza,inj,Quad PF,6+ Mos 01/11/2015   Influenza-Unspecified 11/07/2011, 01/12/2013,  11/12/2013, 01/11/2015, 12/24/2018   PFIZER(Purple Top)SARS-COV-2 Vaccination 03/20/2019, 04/15/2019   Pneumococcal Conjugate-13 04/26/2014   Pneumococcal Polysaccharide-23 02/12/2001, 11/27/2001, 11/28/2006, 11/07/2011   Td 02/12/1998, 06/13/1998   Tdap 09/23/2012    Past Medical History:  Diagnosis Date   Allergic rhinitis, cause unspecified    Anemia    iron   Anxiety    Asthma    Benign paroxysmal positional vertigo    Cancer (HCC)    basal cell carcinoma per right side of nostril   CHF (congestive heart failure) (HCC) 11/09/2016   had 2+ edema in legs with anxiety and SOB   Complication of anesthesia    has 4  degenerative discs in neck.   Depressive disorder, not elsewhere classified    Esophageal reflux    History of hiatal hernia    Hypertension    Localized osteoarthrosis not specified whether primary or secondary, lower leg    Other and unspecified hyperlipidemia    Other diseases of vocal cords    Peptic ulcer, unspecified site, unspecified as acute or chronic, without mention of hemorrhage, perforation, or obstruction    Pneumonia    bilat pneumonia 1987   Pre-diabetes    no meds,just diet controll   TMJ (temporomandibular joint disorder)    Unspecified arthropathy, hand    Unspecified asthma(493.90)    triggered with a virus    Unspecified hypothyroidism     Tobacco History: Social History   Tobacco Use  Smoking Status Former   Current packs/day: 0.00   Average packs/day: 0.3 packs/day for 2.0 years (0.6  ttl pk-yrs)   Types: Cigarettes   Start date: 02/13/1956   Quit date: 02/12/1958   Years since quitting: 64.7  Smokeless Tobacco Never   Counseling given: Not Answered   Outpatient Medications Prior to Visit  Medication Sig Dispense Refill   albuterol (VENTOLIN HFA) 108 (90 Base) MCG/ACT inhaler INHALE 2 PUFFS INTO THE LUNGS EVERY 6 HOURS AS NEEDED FOR SHORTNESS OF BREATH 8.5 each 5   Ascorbic Acid (VITAMIN C WITH ROSE HIPS) 1000 MG tablet Take  1,000 mg by mouth daily.     benzonatate (TESSALON) 200 MG capsule Take 1 capsule (200 mg total) by mouth 3 (three) times daily as needed for cough. 45 capsule 1   budesonide-formoterol (SYMBICORT) 80-4.5 MCG/ACT inhaler Inhale 2 puffs into the lungs in the morning and at bedtime. (Patient taking differently: Inhale 2 puffs into the lungs in the morning and at bedtime. Using daily.) 30.6 each 3   buPROPion (WELLBUTRIN XL) 300 MG 24 hr tablet Take 300 mg by mouth daily.     Calcium Carb-Cholecalciferol (CALCIUM 600+D3 PO) Take 1 tablet by mouth 2 (two) times daily.     Cholecalciferol (VITAMIN D3) 50 MCG (2000 UT) capsule Take 2,000 Units by mouth daily.     famotidine (PEPCID) 20 MG tablet Take 20 mg by mouth at bedtime.      fexofenadine (ALLEGRA) 180 MG tablet Take 180 mg by mouth at bedtime.     folic acid (FOLVITE) 1 MG tablet TAKE 2 TABLETS BY MOUTH EVERY DAY 180 tablet 3   Ginkgo Biloba (GNP GINGKO BILOBA EXTRACT PO) Take 1 tablet by mouth 3 (three) times daily with meals.     hydrochlorothiazide (HYDRODIURIL) 25 MG tablet Take 1 tablet (25 mg total) by mouth daily. 90 tablet 3   lidocaine (LIDODERM) 5 % Place 1 patch onto the skin daily. Remove & Discard patch within 12 hours or as directed by MD (Patient taking differently: Place 1 patch onto the skin daily as needed (pain). Remove & Discard patch within 12 hours or as directed by MD) 90 patch 1   losartan (COZAAR) 50 MG tablet TAKE 1 TABLET BY MOUTH EVERY DAY 90 tablet 3   Magnesium 100 MG TABS Take 100 mg by mouth 2 (two) times a day.     meloxicam (MOBIC) 15 MG tablet TAKE 1 TABLET BY MOUTH EVERY DAY IN THE MORNING 90 tablet 0   metoprolol succinate (TOPROL-XL) 25 MG 24 hr tablet Take 1 tablet by mouth daily.     OVER THE COUNTER MEDICATION Apply 1 Application topically 2 (two) times daily. CBD roll on pain reliever     PAXIL CR 25 MG 24 hr tablet Take 1 tablet (25 mg total) by mouth daily. 90 tablet 3   polyethylene glycol (MIRALAX /  GLYCOLAX) 17 g packet Take 17 g by mouth daily as needed for mild constipation. (Patient taking differently: Take 8.5 g by mouth daily as needed for mild constipation.) 14 each 0   predniSONE (DELTASONE) 20 MG tablet Take 2 tablets (40 mg total) by mouth daily with breakfast. 10 tablet 0   Propylene Glycol, PF, (SYSTANE COMPLETE PF) 0.6 % SOLN Place 1 drop into both eyes 2 (two) times daily.     SYNTHROID 75 MCG tablet Take 1 tablet (75 mcg total) by mouth daily before breakfast. 90 tablet 1   TART CHERRY PO Take 2 capsules by mouth daily.     No facility-administered medications prior to visit.  Review of Systems:   Constitutional:   No  weight loss, night sweats,  Fevers, chills, fatigue, or  lassitude.  HEENT:   No headaches,  Difficulty swallowing,  Tooth/dental problems, or  Sore throat,                No sneezing, itching, ear ache,  +nasal congestion, post nasal drip,   CV:  No chest pain,  Orthopnea, PND, swelling in lower extremities, anasarca, dizziness, palpitations, syncope.   GI  No heartburn, indigestion, abdominal pain, nausea, vomiting, diarrhea, change in bowel habits, loss of appetite, bloody stools.   Resp: No shortness of breath with exertion or at rest.  No excess mucus, no productive cough,  No non-productive cough,  No coughing up of blood.  No change in color of mucus.  No wheezing.  No chest wall deformity  Skin: no rash or lesions.  GU: no dysuria, change in color of urine, no urgency or frequency.  No flank pain, no hematuria   MS:  No joint pain or swelling.  No decreased range of motion.  No back pain.    Physical Exam  BP 108/76 (BP Location: Left Arm, Patient Position: Sitting, Cuff Size: Normal)   Pulse 87   Temp 98.1 F (36.7 C) (Oral)   Ht 5\' 2"  (1.575 m)   Wt 178 lb 6.4 oz (80.9 kg)   SpO2 98%   BMI 32.63 kg/m   GEN: A/Ox3; pleasant , NAD, well nourished    HEENT:  [place]/AT,  , NOSE-clear, THROAT-clear, no lesions, no postnasal drip or  exudate noted.   NECK:  Supple w/ fair ROM; no JVD; normal carotid impulses w/o bruits; no thyromegaly or nodules palpated; no lymphadenopathy.    RESP  Clear  P & A; w/o, wheezes/ rales/ or rhonchi. no accessory muscle use, no dullness to percussion  CARD:  RRR, no m/r/g, tr peripheral edema, pulses intact, no cyanosis or clubbing.  GI:   Soft & nt; nml bowel sounds; no organomegaly or masses detected.   Musco: Warm bil, no deformities or joint swelling noted.   Neuro: alert, no focal deficits noted.    Skin: Warm, no lesions or rashes    Lab Results:    BMET   BNP    Imaging: No results found.  Administration History     None           No data to display          No results found for: "NITRICOXIDE"      Assessment & Plan:   Asthma with allergic rhinitis Mild to moderate persistent asthma currently doing well on Symbicort.  Asthma action plan discussed.  Check PFTs on return.  Continue with trigger prevention  Plan  Patient Instructions  Symbicort 2 puffs daily, rinse after use.  Albuterol inhaler As needed   Saline nasal rinses As needed   Continue on Allegra daily  Delsym 2 tsp Twice daily  As needed  Cough .  Tessalon Three times a day  for cough As needed   Flu shot this fall as planned.  Follow up Dr. Vassie Loll  or Shaheer Bonfield NP 6 months PFT and As needed   Please contact office for sooner follow up if symptoms do not improve or worsen or seek emergency care     Allergic rhinitis Continue on Allegra daily     Rubye Oaks, NP 11/01/2022

## 2022-11-01 NOTE — Assessment & Plan Note (Signed)
Continue on Allegra daily

## 2022-11-01 NOTE — Assessment & Plan Note (Signed)
Mild to moderate persistent asthma currently doing well on Symbicort.  Asthma action plan discussed.  Check PFTs on return.  Continue with trigger prevention  Plan  Patient Instructions  Symbicort 2 puffs daily, rinse after use.  Albuterol inhaler As needed   Saline nasal rinses As needed   Continue on Allegra daily  Delsym 2 tsp Twice daily  As needed  Cough .  Tessalon Three times a day  for cough As needed   Flu shot this fall as planned.  Follow up Dr. Vassie Loll  or Deshan Hemmelgarn NP 6 months PFT and As needed   Please contact office for sooner follow up if symptoms do not improve or worsen or seek emergency care

## 2022-11-02 ENCOUNTER — Ambulatory Visit: Payer: Medicare Other | Admitting: Adult Health

## 2022-11-07 ENCOUNTER — Ambulatory Visit (INDEPENDENT_AMBULATORY_CARE_PROVIDER_SITE_OTHER): Payer: Medicare Other | Admitting: Family Medicine

## 2022-11-07 ENCOUNTER — Encounter: Payer: Self-pay | Admitting: Family Medicine

## 2022-11-07 VITALS — BP 113/70 | HR 86 | Ht 62.0 in | Wt 176.0 lb

## 2022-11-07 DIAGNOSIS — L120 Bullous pemphigoid: Secondary | ICD-10-CM

## 2022-11-07 MED ORDER — CLOBETASOL PROPIONATE 0.05 % EX OINT: 1.0000 | TOPICAL_OINTMENT | Freq: Two times a day (BID) | CUTANEOUS | 1 refills | Status: AC | PRN

## 2022-11-07 MED ORDER — PREDNISONE 20 MG PO TABS: ORAL_TABLET | ORAL | 0 refills | Status: AC

## 2022-11-07 NOTE — Assessment & Plan Note (Signed)
Sent new prescription for clobetasol was the one that she has is about to expire.  I also wrote for a course of steroids in case she starts getting more lesions over the weekend and it seems to be ramping up.  Call us and let us know if she is having any problems or concerns.  We did not recommend rebiopsying the lesions.  Right now there are no apparent bullae but she said that there were yesterday and Monday she has been icing them aggressively and she says that usually flattens the fluid-filled sacs.

## 2022-11-07 NOTE — Progress Notes (Signed)
Acute Office Visit  Subjective:     Patient ID: [name], female    DOB: March 01, 1945, 77 y.o.   MRN: [id number]  No chief complaint on file.   HPI Patient is in today for recurrence of the lesions.  She had bullous pemphigoid back in 2021.  Diagnosed by biopsy.  It pretty much resolved until this past weekend she was at her grandson's birthday party and sat out in the sun for several hours which she does not normally do and then by the time she got home she had noticed a bump on her left wrist initially she thought it was just a mosquito bite so did not think much of it it was very itchy then that night while she was in bed she started to get intense itching and almost a burning sensation across her anterior shins.  She said the skin felt bumpy most like hives.  She put ice on it and then by the next morning she noticed a couple lesions on her left anterior shin that also had fluid in them.  She has been aggressively icing the areas which really helped she says it helps with itching and also helps flatten the bulla.  She still had an old prescription of topical clobetasol which she had used previously and has started using that as well.  ROS      Objective:    BP 113/70   Pulse 86   Ht 5\' 2"  (1.575 m)   Wt 176 lb (79.8 kg)   SpO2 96%   BMI 32.19 kg/m    Physical Exam    No results found for any visits on 11/07/22. The above is of her left lower shin.  She also has a larger lesion on her right outer ankle and 1 on her left wrist.     Assessment & Plan:   Problem List Items Addressed This Visit       Musculoskeletal and Integument   Bullous pemphigoid - Primary    Sent new prescription for clobetasol was the one that she has is about to expire.  I also wrote for a course of steroids in case she starts getting more lesions over the weekend and it seems to be ramping up.  Call us and let us know if she is having any problems or concerns.  We did not recommend  rebiopsying the lesions.  Right now there are no apparent bullae but she said that there were yesterday and Monday she has been icing them aggressively and she says that usually flattens the fluid-filled sacs.       Meds ordered this encounter  Medications   clobetasol ointment (TEMOVATE) 0.05 %    Sig: Apply 1 Application topically 2 (two) times daily as needed.    Dispense:  30 g    Refill:  1   predniSONE (DELTASONE) 20 MG tablet    Sig: Take 2 tablets (40 mg total) by mouth daily with breakfast for 5 days, THEN 1 tablet (20 mg total) daily with breakfast for 5 days, THEN 0.5 tablets (10 mg total) daily with breakfast for 5 days.    Dispense:  18 tablet    Refill:  0    Pt will call if needed.    No follow-ups on file.  Nani Gasser, MD

## 2022-11-17 ENCOUNTER — Other Ambulatory Visit: Payer: Self-pay | Admitting: Family Medicine

## 2022-11-20 ENCOUNTER — Telehealth: Payer: Self-pay | Admitting: Family Medicine

## 2022-11-20 DIAGNOSIS — R7301 Impaired fasting glucose: Secondary | ICD-10-CM

## 2022-11-20 DIAGNOSIS — I10 Essential (primary) hypertension: Secondary | ICD-10-CM

## 2022-11-20 NOTE — Telephone Encounter (Signed)
Pt called and advised that she can get labs done

## 2022-11-20 NOTE — Telephone Encounter (Signed)
Patient is requesting lab orders including liver enzymes

## 2022-11-20 NOTE — Telephone Encounter (Signed)
Orders Placed This Encounter  Procedures   CMP14+EGFR   Hemoglobin A1c

## 2022-11-21 ENCOUNTER — Other Ambulatory Visit: Payer: Self-pay | Admitting: Family Medicine

## 2022-11-25 ENCOUNTER — Other Ambulatory Visit: Payer: Self-pay | Admitting: Family Medicine

## 2022-12-04 ENCOUNTER — Other Ambulatory Visit: Payer: Medicare Other

## 2022-12-04 ENCOUNTER — Ambulatory Visit: Payer: Medicare Other | Admitting: Family

## 2022-12-05 ENCOUNTER — Inpatient Hospital Stay: Payer: Medicare Other

## 2022-12-05 ENCOUNTER — Inpatient Hospital Stay: Payer: Medicare Other | Admitting: Medical Oncology

## 2022-12-11 ENCOUNTER — Other Ambulatory Visit: Payer: Self-pay | Admitting: Family Medicine

## 2022-12-12 ENCOUNTER — Inpatient Hospital Stay (HOSPITAL_BASED_OUTPATIENT_CLINIC_OR_DEPARTMENT_OTHER): Payer: Medicare Other | Admitting: Medical Oncology

## 2022-12-12 ENCOUNTER — Inpatient Hospital Stay: Payer: Medicare Other | Attending: Hematology & Oncology

## 2022-12-12 ENCOUNTER — Encounter: Payer: Self-pay | Admitting: Medical Oncology

## 2022-12-12 VITALS — BP 112/76 | HR 84 | Temp 98.1°F | Resp 17 | Wt 178.1 lb

## 2022-12-12 DIAGNOSIS — E611 Iron deficiency: Secondary | ICD-10-CM

## 2022-12-12 DIAGNOSIS — D509 Iron deficiency anemia, unspecified: Secondary | ICD-10-CM | POA: Insufficient documentation

## 2022-12-12 DIAGNOSIS — Z96642 Presence of left artificial hip joint: Secondary | ICD-10-CM | POA: Insufficient documentation

## 2022-12-12 DIAGNOSIS — Z79899 Other long term (current) drug therapy: Secondary | ICD-10-CM | POA: Diagnosis not present

## 2022-12-12 LAB — CBC WITH DIFFERENTIAL (CANCER CENTER ONLY)
Abs Immature Granulocytes: 0.03 10*3/uL (ref 0.00–0.07)
Basophils Absolute: 0.1 10*3/uL (ref 0.0–0.1)
Basophils Relative: 1 %
Eosinophils Absolute: 0.4 10*3/uL (ref 0.0–0.5)
Eosinophils Relative: 6 %
HCT: 39.2 % (ref 36.0–46.0)
Hemoglobin: 12.8 g/dL (ref 12.0–15.0)
Immature Granulocytes: 1 %
Lymphocytes Relative: 21 %
Lymphs Abs: 1.4 10*3/uL (ref 0.7–4.0)
MCH: 28.8 pg (ref 26.0–34.0)
MCHC: 32.7 g/dL (ref 30.0–36.0)
MCV: 88.3 fL (ref 80.0–100.0)
Monocytes Absolute: 0.7 10*3/uL (ref 0.1–1.0)
Monocytes Relative: 10 %
Neutro Abs: 4 10*3/uL (ref 1.7–7.7)
Neutrophils Relative %: 61 %
Platelet Count: 279 10*3/uL (ref 150–400)
RBC: 4.44 MIL/uL (ref 3.87–5.11)
RDW: 13.5 % (ref 11.5–15.5)
WBC Count: 6.6 10*3/uL (ref 4.0–10.5)
nRBC: 0 % (ref 0.0–0.2)

## 2022-12-12 LAB — FERRITIN: Ferritin: 108 ng/mL (ref 11–307)

## 2022-12-12 NOTE — Progress Notes (Signed)
Hematology and Oncology Follow Up Visit  [name] [id number] [birth year] 77 y.o. 12/12/2022   Principle Diagnosis:  Iron deficiency anemia    Current Therapy:        IV iron as indicated- Monoferric- 12/14/2021   Interim History:  [name] is here today for follow-up.   She states that she is doing well. Does have some fatigue.  Does not tolerate oral iron.  SOB and dizziness have improved.  No fever, chills, n/v, cough, rash, chest pain, palpitations, abdominal pain or changes in bowel or bladder habits.  No swelling, tenderness, numbness or tingling in her extremities at this time.  No falls or syncope.  Appetite and hydration are improving Wt Readings from Last 3 Encounters:  12/12/22 178 lb 1.3 oz (80.8 kg)  11/07/22 176 lb (79.8 kg)  11/01/22 178 lb 6.4 oz (80.9 kg)   ECOG Performance Status: 1 - Symptomatic but completely ambulatory  Medications:  Allergies as of 12/12/2022       Reactions   Moxifloxacin Other (See Comments)   confusion, dizziness, paranoia   Other    Black pepper -stomach pain   Astelin [azelastine Hcl] Other (See Comments)   Headache   Celebrex [celecoxib] Other (See Comments)   Stomach issues   Lisinopril Cough   Methotrexate Derivatives Other (See Comments)   Flu like symptoms    Morphine Nausea And Vomiting        Medication List        Accurate as of December 12, 2022  2:54 PM. If you have any questions, ask your nurse or doctor.          albuterol 108 (90 Base) MCG/ACT inhaler Commonly known as: VENTOLIN HFA INHALE 2 PUFFS INTO THE LUNGS EVERY 6 HOURS AS NEEDED FOR SHORTNESS OF BREATH   budesonide-formoterol 80-4.5 MCG/ACT inhaler Commonly known as: Symbicort Inhale 2 puffs into the lungs in the morning and at bedtime. What changed: additional instructions   buPROPion 300 MG 24 hr tablet Commonly known as: WELLBUTRIN XL Take 300 mg by mouth daily.   CALCIUM 600+D3 PO Take 1 tablet by mouth 2 (two) times  daily.   clobetasol cream 0.05 % Commonly known as: TEMOVATE Apply 1 Application topically 2 (two) times daily.   clobetasol ointment 0.05 % Commonly known as: TEMOVATE Apply 1 Application topically 2 (two) times daily as needed.   famotidine 20 MG tablet Commonly known as: PEPCID Take 20 mg by mouth at bedtime.   fexofenadine 180 MG tablet Commonly known as: ALLEGRA Take 180 mg by mouth at bedtime.   folic acid 1 MG tablet Commonly known as: FOLVITE TAKE 2 TABLETS BY MOUTH EVERY DAY What changed: how much to take   GNP GINGKO BILOBA EXTRACT PO Take 1 tablet by mouth 3 (three) times daily with meals.   hydrochlorothiazide 25 MG tablet Commonly known as: HYDRODIURIL TAKE 1 TABLET BY MOUTH EVERY DAY   lidocaine 5 % Commonly known as: Lidoderm Place 1 patch onto the skin daily. Remove & Discard patch within 12 hours or as directed by MD What changed:  when to take this reasons to take this   losartan 50 MG tablet Commonly known as: COZAAR TAKE 1 TABLET BY MOUTH EVERY DAY   Magnesium 100 MG Tabs Take 100 mg by mouth 2 (two) times a day.   meloxicam 15 MG tablet Commonly known as: MOBIC TAKE 1 TABLET BY MOUTH EVERY DAY IN THE MORNING   metoprolol succinate 25 MG 24 hr  tablet Commonly known as: TOPROL-XL Take 1 tablet by mouth daily.   OVER THE COUNTER MEDICATION Apply 1 Application topically 2 (two) times daily. CBD roll on pain reliever   Paxil CR 25 MG 24 hr tablet Generic drug: PARoxetine Take 1 tablet (25 mg total) by mouth daily. What changed: how much to take   polyethylene glycol 17 g packet Commonly known as: MIRALAX / GLYCOLAX Take 17 g by mouth daily as needed for mild constipation. What changed: how much to take   Synthroid 75 MCG tablet Generic drug: levothyroxine TAKE 1 TABLET BY MOUTH DAILY BEFORE BREAKFAST.   Systane Complete PF 0.6 % Soln Generic drug: Propylene Glycol (PF) Place 1 drop into both eyes 2 (two) times daily.   TART  CHERRY PO Take 2 capsules by mouth daily.   vitamin C with rose hips 1000 MG tablet Take 1,000 mg by mouth daily.   Vitamin D3 50 MCG (2000 UT) capsule Take 2,000 Units by mouth daily.        Allergies:  Allergies  Allergen Reactions   Moxifloxacin Other (See Comments)    confusion, dizziness, paranoia   Other     Black pepper -stomach pain   Astelin [Azelastine Hcl] Other (See Comments)    Headache   Celebrex [Celecoxib] Other (See Comments)    Stomach issues   Lisinopril Cough   Methotrexate Derivatives Other (See Comments)    Flu like symptoms    Morphine Nausea And Vomiting    Past Medical History, Surgical history, Social history, and Family History were reviewed and updated.  Review of Systems: All other 10 point review of systems is negative.   Physical Exam:  weight is 178 lb 1.3 oz (80.8 kg). Her oral temperature is 98.1 F (36.7 C). Her blood pressure is 112/76 and her pulse is 84. Her respiration is 17 and oxygen saturation is 100%.   Wt Readings from Last 3 Encounters:  12/12/22 178 lb 1.3 oz (80.8 kg)  11/07/22 176 lb (79.8 kg)  11/01/22 178 lb 6.4 oz (80.9 kg)    Ocular: Sclerae unicteric, pupils equal, round and reactive to light Ear-nose-throat: Oropharynx clear, dentition fair Lymphatic: No cervical or supraclavicular adenopathy Lungs no rales or rhonchi, good excursion bilaterally Heart regular rate and rhythm, no murmur appreciated MSK no focal spinal tenderness, no joint edema Neuro: non-focal, well-oriented, appropriate affect  Lab Results  Component Value Date   WBC 6.6 12/12/2022   HGB 12.8 12/12/2022   HCT 39.2 12/12/2022   MCV 88.3 12/12/2022   PLT 279 12/12/2022   Lab Results  Component Value Date   FERRITIN 218 09/03/2022   IRON 75 09/03/2022   TIBC 332 09/03/2022   UIBC 257 09/03/2022   IRONPCTSAT 23 09/03/2022   Lab Results  Component Value Date   RETICCTPCT 2.3 09/03/2022   RBC 4.44 12/12/2022   No results found  for: "KPAFRELGTCHN", "LAMBDASER", "KAPLAMBRATIO" No results found for: "IGGSERUM", "IGA", "IGMSERUM" No results found for: "TOTALPROTELP", "ALBUMINELP", "A1GS", "A2GS", "BETS", "BETA2SER", "GAMS", "MSPIKE", "SPEI"   Chemistry      Component Value Date/Time   NA 133 (L) 08/01/2022 0353   NA 138 10/30/2019 0000   NA 143 04/17/2012 1445   K 4.1 08/01/2022 0353   K 3.8 04/17/2012 1445   CL 101 08/01/2022 0353   CL 106 04/17/2012 1445   CO2 24 08/01/2022 0353   CO2 27 04/17/2012 1445   BUN 17 08/01/2022 0353   BUN 19 10/30/2019 0000  BUN 15.6 04/17/2012 1445   CREATININE 0.73 08/01/2022 0353   CREATININE 0.86 11/27/2021 1507   CREATININE 0.96 11/08/2021 0000   CREATININE 0.8 04/17/2012 1445   GLU 92 10/30/2019 0000      Component Value Date/Time   CALCIUM 8.2 (L) 08/01/2022 0353   CALCIUM 9.2 04/17/2012 1445   ALKPHOS 52 11/27/2021 1507   ALKPHOS 75 04/17/2012 1445   AST 15 11/27/2021 1507   AST 18 04/17/2012 1445   ALT 20 11/27/2021 1507   ALT 40 04/17/2012 1445   BILITOT 0.4 11/27/2021 1507   BILITOT 0.31 04/17/2012 1445     Encounter Diagnosis  Name Primary?   Iron deficiency Yes    Impression and Plan: Ms. Angelyn Punt is a very pleasant 77 yo caucasian female with history of iron deficiency anemia post total left hip replacement surgery.  Iron studies are pending. Will replace if needed.  RTC 6 months APP, labs (Cbc, iron, ferritin)-Millard  Rushie Chestnut, PA-C 10/30/20242:54 PM

## 2022-12-13 LAB — IRON AND IRON BINDING CAPACITY (CC-WL,HP ONLY)
Iron: 59 ug/dL (ref 28–170)
Saturation Ratios: 17 % (ref 10.4–31.8)
TIBC: 354 ug/dL (ref 250–450)
UIBC: 295 ug/dL (ref 148–442)

## 2022-12-18 ENCOUNTER — Ambulatory Visit: Payer: Medicare Other | Admitting: Family Medicine

## 2022-12-18 DIAGNOSIS — F422 Mixed obsessional thoughts and acts: Secondary | ICD-10-CM | POA: Diagnosis not present

## 2022-12-18 DIAGNOSIS — F902 Attention-deficit hyperactivity disorder, combined type: Secondary | ICD-10-CM | POA: Diagnosis not present

## 2022-12-18 DIAGNOSIS — F410 Panic disorder [episodic paroxysmal anxiety] without agoraphobia: Secondary | ICD-10-CM | POA: Diagnosis not present

## 2022-12-20 ENCOUNTER — Ambulatory Visit (INDEPENDENT_AMBULATORY_CARE_PROVIDER_SITE_OTHER): Payer: Medicare Other

## 2022-12-20 DIAGNOSIS — Z23 Encounter for immunization: Secondary | ICD-10-CM | POA: Diagnosis not present

## 2022-12-20 DIAGNOSIS — R7301 Impaired fasting glucose: Secondary | ICD-10-CM | POA: Diagnosis not present

## 2022-12-20 DIAGNOSIS — I10 Essential (primary) hypertension: Secondary | ICD-10-CM | POA: Diagnosis not present

## 2022-12-21 LAB — HEMOGLOBIN A1C
Est. average glucose Bld gHb Est-mCnc: 131 mg/dL
Hgb A1c MFr Bld: 6.2 % — ABNORMAL HIGH (ref 4.8–5.6)

## 2022-12-21 LAB — CMP14+EGFR
ALT: 14 [IU]/L (ref 0–32)
AST: 12 [IU]/L (ref 0–40)
Albumin: 4.2 g/dL (ref 3.8–4.8)
Alkaline Phosphatase: 60 [IU]/L (ref 44–121)
BUN/Creatinine Ratio: 25 (ref 12–28)
BUN: 20 mg/dL (ref 8–27)
Bilirubin Total: 0.4 mg/dL (ref 0.0–1.2)
CO2: 24 mmol/L (ref 20–29)
Calcium: 9.7 mg/dL (ref 8.7–10.3)
Chloride: 105 mmol/L (ref 96–106)
Creatinine, Ser: 0.81 mg/dL (ref 0.57–1.00)
Globulin, Total: 2.3 g/dL (ref 1.5–4.5)
Glucose: 90 mg/dL (ref 70–99)
Potassium: 4.4 mmol/L (ref 3.5–5.2)
Sodium: 144 mmol/L (ref 134–144)
Total Protein: 6.5 g/dL (ref 6.0–8.5)
eGFR: 74 mL/min/{1.73_m2} (ref >59)

## 2022-12-24 ENCOUNTER — Telehealth: Payer: Self-pay

## 2022-12-24 ENCOUNTER — Ambulatory Visit: Payer: Medicare Other

## 2022-12-24 ENCOUNTER — Ambulatory Visit
Admission: EM | Admit: 2022-12-24 | Discharge: 2022-12-24 | Disposition: A | Discharge status: Home / Self Care | Payer: Medicare Other | Attending: Family Medicine | Admitting: Family Medicine

## 2022-12-24 DIAGNOSIS — W1830XA Fall on same level, unspecified, initial encounter: Secondary | ICD-10-CM | POA: Diagnosis not present

## 2022-12-24 DIAGNOSIS — W19XXXA Unspecified fall, initial encounter: Secondary | ICD-10-CM

## 2022-12-24 DIAGNOSIS — S4992XA Unspecified injury of left shoulder and upper arm, initial encounter: Secondary | ICD-10-CM

## 2022-12-24 DIAGNOSIS — Z043 Encounter for examination and observation following other accident: Secondary | ICD-10-CM | POA: Diagnosis not present

## 2022-12-24 DIAGNOSIS — Z950 Presence of cardiac pacemaker: Secondary | ICD-10-CM | POA: Diagnosis not present

## 2022-12-24 DIAGNOSIS — S0083XA Contusion of other part of head, initial encounter: Secondary | ICD-10-CM | POA: Diagnosis not present

## 2022-12-24 DIAGNOSIS — S0993XA Unspecified injury of face, initial encounter: Secondary | ICD-10-CM | POA: Diagnosis not present

## 2022-12-24 NOTE — Discharge Instructions (Addendum)
Advised patient may take OTC Tylenol 1 g every 8 hours for facial contusion and/or left shoulder pain.  Encouraged to increase daily water intake while taking these medications.  Advised we will follow-up with x-ray results once received.  Advised if symptoms worsen and/or unresolved please follow-up with PCP or [facility] for further evaluation.

## 2022-12-24 NOTE — ED Triage Notes (Signed)
[facility] fall last night. Pt reports she was trying to clean up crumbs on the floor and her slippers stuck and she fell forward and hit her face/ left arm/ cheek and eye. Pt reports she took tylenol and ice.

## 2022-12-24 NOTE — Progress Notes (Signed)
Hi [name], A1c is 6.2 still in the prediabetes range which is great.  Your metabolic panel looks great the sodium and potassium levels are actually back to normal.  Please let us know, if you have had your tetanus shot updated at the pharmacy.

## 2022-12-24 NOTE — ED Provider Notes (Signed)
[name]    CSN: [id number] Arrival date & time: 12/24/22  1346      History   Chief Complaint Chief Complaint  Patient presents with   Fall    HPI [name] is a 77 y.o. female.   HPI 77 year old female presents with left facial contusion and left shoulder pain secondary to fall at home last night.  Patient reports was cleaning up crumbs on the floor when slipper got stuck causing her to fall forward.  PMH significant for CHF (S/P cardiac pacemaker), BCC, and HTN.  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   Depressive disorder, not elsewhere classified    Esophageal reflux    History of hiatal hernia    Hypertension    Localized osteoarthrosis not specified whether primary or secondary, lower leg    Other and unspecified hyperlipidemia    Other diseases of vocal cords    Peptic ulcer, unspecified site, unspecified as acute or chronic, without mention of hemorrhage, perforation, or obstruction    Pneumonia    bilat pneumonia 1987   Pre-diabetes    no meds,just diet controll   TMJ (temporomandibular joint disorder)    Unspecified arthropathy, hand    Unspecified asthma(493.90)    triggered with a virus    Unspecified hypothyroidism     Patient Active Problem List   Diagnosis Date Noted   S/P total knee arthroplasty, right 07/31/2022   S/P placement of cardiac pacemaker 01/29/2022   Iron deficiency 11/06/2021   S/P total left hip arthroplasty 08/22/2021   Spondylosis without myelopathy or radiculopathy, cervical region 12/29/2019   Occipital pain 12/28/2019   Bullous pemphigoid 11/06/2019   Right  renal stone 09/21/2019   Drug-induced constipation 05/25/2019   Blood in stool 05/25/2019   Internal hemorrhoids 05/25/2019   Family history of colon cancer 05/25/2019   Lumbar degenerative disc disease 11/07/2018   Cervical pain 11/07/2018   Overweight (BMI 25.0-29.9) 08/27/2018   S/P left TKA 08/26/2018   Pain in right knee 01/03/2018   Aortic atherosclerosis (HCC) 09/26/2017   Fatty liver 11/09/2016   Vitamin D deficiency 06/19/2016   Chronic fatigue 06/19/2016   Alopecia 06/19/2016   Tendon nodule 01/16/2016   Venous stasis ulcer of left lower extremity (HCC) 03/03/2015   S/P right THA, AA 08/10/2014   Primary osteoarthritis of right hip 05/27/2014   Essential hypertension, benign 05/26/2014   IFG (impaired fasting glucose) 01/25/2014   Vitreous detachment 09/23/2012   Oral herpes 06/23/2012   OCD (obsessive compulsive disorder) 05/09/2012   Atypical ductal hyperplasia of breast 10/29/2011   Abnormal mammogram with microcalcification 10/04/2011   Obesity (BMI 30-39.9) 05/03/2011   CARRIER/SUSPECTED CARRIER GROUP B STREPTOCOCCUS 11/17/2010   Abnormal liver enzymes 10/25/2010   SOMNOLENCE 11/25/2009   Rheumatoid arthritis with rheumatoid factor (HCC) 11/30/2008   Asymptomatic postmenopausal status 11/30/2008   Localized osteoarthrosis, lower leg 03/19/2007   Allergic rhinitis 11/05/2006   VOCAL CORD DISORDER 11/05/2006   Asthma with allergic rhinitis 11/05/2006   Hypothyroidism 09/24/2006   DEPRESSION 09/24/2006  VERTIGO, BENIGN PAROXYSMAL POSITION 09/24/2006   GERD 09/24/2006   HYPERLIPIDEMIA 09/23/2006    Past Surgical History:  Procedure Laterality Date   BREAST BIOPSY  09/07/2011   High Risk Lesion   BREAST EXCISIONAL BIOPSY Left    BREAST SURGERY     LUMPECTOMY / LEFT 10/12/2011   KNEE ARTHROSCOPY     bilat    NASAL SINUS SURGERY     ROTATOR CUFF REPAIR     right    thumb surgery      left hand 45 years ago    TOTAL HIP ARTHROPLASTY Right 08/10/2014    Procedure: RIGHT TOTAL HIP ARTHROPLASTY ANTERIOR APPROACH;  Surgeon: Durene Romans, MD;  Location: [facility];  Service: Orthopedics;  Laterality: Right;   TOTAL HIP ARTHROPLASTY Left 08/22/2021   Procedure: TOTAL HIP ARTHROPLASTY ANTERIOR APPROACH;  Surgeon: Durene Romans, MD;  Location: [facility];  Service: Orthopedics;  Laterality: Left;   TOTAL KNEE ARTHROPLASTY Left 08/26/2018   Procedure: TOTAL KNEE ARTHROPLASTY;  Surgeon: Durene Romans, MD;  Location: [facility];  Service: Orthopedics;  Laterality: Left;  70 mins   TOTAL KNEE ARTHROPLASTY Right 07/31/2022   Procedure: TOTAL KNEE ARTHROPLASTY;  Surgeon: Durene Romans, MD;  Location: [facility];  Service: Orthopedics;  Laterality: Right;    OB History   No obstetric history on file.      Home Medications    Prior to Admission medications   Medication Sig Start Date End Date Taking? Authorizing Provider  albuterol (VENTOLIN HFA) 108 (90 Base) MCG/ACT inhaler INHALE 2 PUFFS INTO THE LUNGS EVERY 6 HOURS AS NEEDED FOR SHORTNESS OF BREATH 12/04/21   Oretha Milch, MD  Ascorbic Acid (VITAMIN C WITH ROSE HIPS) 1000 MG tablet Take 1,000 mg by mouth daily.    [provider]  budesonide-formoterol (SYMBICORT) 80-4.5 MCG/ACT inhaler Inhale 2 puffs into the lungs in the morning and at bedtime. Patient taking differently: Inhale 2 puffs into the lungs in the morning and at bedtime. Using daily. 08/27/22   Oretha Milch, MD  buPROPion (WELLBUTRIN XL) 300 MG 24 hr tablet Take 300 mg by mouth daily. 08/25/20   [provider]  Calcium Carb-Cholecalciferol (CALCIUM 600+D3 PO) Take 1 tablet by mouth 2 (two) times daily.    [provider]  Cholecalciferol (VITAMIN D3) 50 MCG (2000 UT) capsule Take 2,000 Units by mouth daily.    [provider]  clobetasol cream (TEMOVATE) 0.05 % Apply 1 Application topically 2 (two) times daily.    [provider]  clobetasol ointment (TEMOVATE) 0.05 % Apply 1 Application topically 2 (two)  times daily as needed. 11/07/22   Agapito Games, MD  famotidine (PEPCID) 20 MG tablet Take 20 mg by mouth at bedtime.     [provider]  fexofenadine (ALLEGRA) 180 MG tablet Take 180 mg by mouth at bedtime.    [provider]  folic acid (FOLVITE) 1 MG tablet TAKE 2 TABLETS BY MOUTH EVERY DAY Patient taking differently: Take 1 mg by mouth daily. 08/06/22   Agapito Games, MD  Ginkgo Biloba (GNP GINGKO BILOBA EXTRACT PO) Take 1 tablet by mouth 3 (three) times daily with meals.    [provider]  hydrochlorothiazide (HYDRODIURIL) 25 MG tablet TAKE 1 TABLET BY MOUTH EVERY DAY 11/29/22   Agapito Games, MD  lidocaine (LIDODERM) 5 % Place 1 patch onto the skin daily. Remove & Discard patch within 12 hours or as directed by MD Patient taking differently: Place 1  patch onto the skin daily as needed (pain). Remove & Discard patch within 12 hours or as directed by MD 06/05/22   Agapito Games, MD  losartan (COZAAR) 50 MG tablet TAKE 1 TABLET BY MOUTH EVERY DAY 11/19/22   Agapito Games, MD  Magnesium 100 MG TABS Take 100 mg by mouth 2 (two) times a day.    [provider]  meloxicam (MOBIC) 15 MG tablet TAKE 1 TABLET BY MOUTH EVERY DAY IN THE MORNING 12/11/22   Agapito Games, MD  metoprolol succinate (TOPROL-XL) 25 MG 24 hr tablet Take 1 tablet by mouth daily.    [provider]  OVER THE COUNTER MEDICATION Apply 1 Application topically 2 (two) times daily. CBD roll on pain reliever    [provider]  PAXIL CR 25 MG 24 hr tablet Take 1 tablet (25 mg total) by mouth daily. Patient taking differently: Take 12.5 mg by mouth daily. 08/24/22   Agapito Games, MD  polyethylene glycol (MIRALAX / GLYCOLAX) 17 g packet Take 17 g by mouth daily as needed for mild constipation. Patient taking differently: Take 8.5 g by mouth daily as needed for mild constipation. 08/23/21   Cassandria Anger, PA-C  Propylene Glycol,  PF, (SYSTANE COMPLETE PF) 0.6 % SOLN Place 1 drop into both eyes 2 (two) times daily.    [provider]  SYNTHROID 75 MCG tablet TAKE 1 TABLET BY MOUTH DAILY BEFORE BREAKFAST. 11/21/22   Agapito Games, MD  TART CHERRY PO Take 2 capsules by mouth daily.    [provider]    Family History Family History  Problem Relation Age of Onset   Heart disease Father    Colon cancer Mother    Liver cancer Brother    Asthma Brother     Social History Social History   Tobacco Use   Smoking status: Former    Current packs/day: 0.00    Average packs/day: 0.3 packs/day for 2.0 years (0.6 ttl pk-yrs)    Types: Cigarettes    Start date: 02/13/1956    Quit date: 02/12/1958    Years since quitting: 64.9   Smokeless tobacco: Never  Vaping Use   Vaping status: Never Used  Substance Use Topics   Alcohol use: Yes    Alcohol/week: 1.0 standard drink of alcohol    Types: 1 Glasses of wine per week    Comment: wine socially   Drug use: No     Allergies   Moxifloxacin, Other, Astelin [azelastine hcl], Celebrex [celecoxib], Lisinopril, Methotrexate derivatives, and Morphine   Review of Systems Review of Systems  HENT:         Contusion of left sided face secondary to fall  Musculoskeletal:        Left shoulder pain secondary to fall     Physical Exam Triage Vital Signs ED Triage Vitals  Encounter Vitals Group     BP 12/24/22 1431 127/74     Systolic BP Percentile --      Diastolic BP Percentile --      Pulse --      Resp 12/24/22 1429 16     Temp 12/24/22 1429 98.2 F (36.8 C)     Temp Source 12/24/22 1429 Oral     SpO2 12/24/22 1429 98 %     Weight --      Height --      Head Circumference --      Peak Flow --  Pain Score 12/24/22 1428 3     Pain Loc --      Pain Education --      Exclude from Growth Chart --    No data found.  Updated Vital Signs BP 127/74   Temp 98.2 F (36.8 C) (Oral)   Resp 16   SpO2 98%   Physical Exam Vitals and  nursing note reviewed.  Constitutional:      Appearance: Normal appearance. She is normal weight.  HENT:     Head: Normocephalic and atraumatic.     Mouth/Throat:     Mouth: Mucous membranes are moist.     Pharynx: Oropharynx is clear.  Eyes:     Extraocular Movements: Extraocular movements intact.     Conjunctiva/sclera: Conjunctivae normal.     Pupils: Pupils are equal, round, and reactive to light.  Cardiovascular:     Rate and Rhythm: Normal rate and regular rhythm.     Pulses: Normal pulses.     Heart sounds: Normal heart sounds.  Pulmonary:     Effort: Pulmonary effort is normal.     Breath sounds: Normal breath sounds. No wheezing, rhonchi or rales.  Musculoskeletal:        General: Normal range of motion.     Cervical back: Normal range of motion and neck supple.  Skin:    General: Skin is warm and dry.  Neurological:     General: No focal deficit present.     Mental Status: She is alert and oriented to person, place, and time. Mental status is at baseline.  Psychiatric:        Mood and Affect: Mood normal.        Behavior: Behavior normal.        Thought Content: Thought content normal.      UC Treatments / Results  Labs (all labs ordered are listed, but only abnormal results are displayed) Labs Reviewed - No data to display  EKG   Radiology No results found.  Procedures Procedures (including critical care time)  Medications Ordered in UC Medications - No data to display  Initial Impression / Assessment and Plan / UC Course  I have reviewed the triage vital signs and the nursing notes.  Pertinent labs & imaging results that were available during my care of the patient were reviewed by me and considered in my medical decision making (see chart for details).     MDM: 1.  Contusion of face, initial encounter-note x-ray results from facial bones revealed above no abnormality; 2.  Injury of left shoulder initial encounter left shoulder x-ray reveals  above; 3.  Fall, initial encounter at home last night. Advised patient may take OTC Tylenol 1 g every 8 hours for facial contusion and/or left shoulder pain.  Encouraged to increase daily water intake while taking these medications.  Advised we will follow-up with x-ray results once received.  Advised if symptoms worsen and/or unresolved please follow-up with PCP or  orthopedics for further evaluation. Final Clinical Impressions(s) / UC Diagnoses   Final diagnoses:  Fall, initial encounter  Injury of left shoulder, initial encounter  Contusion of face, initial encounter     Discharge Instructions      Advised patient may take OTC Tylenol 1 g every 8 hours for facial contusion and/or left shoulder pain.  Encouraged to increase daily water intake while taking these medications.  Advised we will follow-up with x-ray results once received.  Advised if symptoms worsen and/or unresolved please follow-up  with PCP or [facility] for further evaluation.     ED Prescriptions   None    I have reviewed the PDMP during this encounter.   Trevor Iha, FNP 12/24/22 [phone]

## 2023-01-06 DIAGNOSIS — I495 Sick sinus syndrome: Secondary | ICD-10-CM | POA: Diagnosis not present

## 2023-01-07 DIAGNOSIS — Z950 Presence of cardiac pacemaker: Secondary | ICD-10-CM | POA: Diagnosis not present

## 2023-01-07 DIAGNOSIS — Z4889 Encounter for other specified surgical aftercare: Secondary | ICD-10-CM | POA: Diagnosis not present

## 2023-01-07 DIAGNOSIS — I495 Sick sinus syndrome: Secondary | ICD-10-CM | POA: Diagnosis not present

## 2023-01-07 DIAGNOSIS — I498 Other specified cardiac arrhythmias: Secondary | ICD-10-CM | POA: Diagnosis not present

## 2023-01-07 DIAGNOSIS — I471 Supraventricular tachycardia, unspecified: Secondary | ICD-10-CM | POA: Diagnosis not present

## 2023-01-07 DIAGNOSIS — I700 Atherosclerosis of aorta: Secondary | ICD-10-CM | POA: Diagnosis not present

## 2023-01-07 DIAGNOSIS — I10 Essential (primary) hypertension: Secondary | ICD-10-CM | POA: Diagnosis not present

## 2023-01-08 ENCOUNTER — Ambulatory Visit: Payer: Medicare Other | Admitting: Family Medicine

## 2023-01-15 DIAGNOSIS — F422 Mixed obsessional thoughts and acts: Secondary | ICD-10-CM | POA: Diagnosis not present

## 2023-01-15 DIAGNOSIS — F902 Attention-deficit hyperactivity disorder, combined type: Secondary | ICD-10-CM | POA: Diagnosis not present

## 2023-01-15 DIAGNOSIS — F410 Panic disorder [episodic paroxysmal anxiety] without agoraphobia: Secondary | ICD-10-CM | POA: Diagnosis not present

## 2023-01-25 DIAGNOSIS — H2513 Age-related nuclear cataract, bilateral: Secondary | ICD-10-CM | POA: Diagnosis not present

## 2023-01-25 DIAGNOSIS — H524 Presbyopia: Secondary | ICD-10-CM | POA: Diagnosis not present

## 2023-01-31 ENCOUNTER — Ambulatory Visit: Payer: Medicare Other | Admitting: Family Medicine

## 2023-02-01 ENCOUNTER — Telehealth: Payer: Self-pay | Admitting: Family Medicine

## 2023-02-01 ENCOUNTER — Ambulatory Visit (INDEPENDENT_AMBULATORY_CARE_PROVIDER_SITE_OTHER): Payer: Medicare Other | Admitting: Family Medicine

## 2023-02-01 ENCOUNTER — Ambulatory Visit: Payer: Medicare Other | Admitting: Family Medicine

## 2023-02-01 ENCOUNTER — Encounter: Payer: Self-pay | Admitting: Family Medicine

## 2023-02-01 VITALS — BP 112/76 | HR 89 | Ht 62.0 in | Wt 181.0 lb

## 2023-02-01 DIAGNOSIS — R7301 Impaired fasting glucose: Secondary | ICD-10-CM

## 2023-02-01 DIAGNOSIS — I10 Essential (primary) hypertension: Secondary | ICD-10-CM | POA: Diagnosis not present

## 2023-02-01 DIAGNOSIS — M05741 Rheumatoid arthritis with rheumatoid factor of right hand without organ or systems involvement: Secondary | ICD-10-CM

## 2023-02-01 DIAGNOSIS — R202 Paresthesia of skin: Secondary | ICD-10-CM

## 2023-02-01 DIAGNOSIS — M05742 Rheumatoid arthritis with rheumatoid factor of left hand without organ or systems involvement: Secondary | ICD-10-CM

## 2023-02-01 DIAGNOSIS — M47812 Spondylosis without myelopathy or radiculopathy, cervical region: Secondary | ICD-10-CM

## 2023-02-01 DIAGNOSIS — E038 Other specified hypothyroidism: Secondary | ICD-10-CM | POA: Diagnosis not present

## 2023-02-01 NOTE — Assessment & Plan Note (Signed)
Declines to be on DMARDs

## 2023-02-01 NOTE — Telephone Encounter (Signed)
Call pt: we can consider 2 options for her hand numbness. We can try to have her wear a cock-up splint at night on her wrist for 4 weeks and see if sxs improve or we can refer for a nerve study with a neurologist to see exactly where the nerve might be pinched causing the numbness I her hands.

## 2023-02-01 NOTE — Assessment & Plan Note (Signed)
Well controlled. Continue current regimen. Follow up in  6 mo  

## 2023-02-01 NOTE — Progress Notes (Signed)
   Established Patient Office Visit  Subjective  Patient ID: [name], female    DOB: [birth year]  Age: 77 y.o. MRN: [id number]  Chief Complaint  Patient presents with   Medical Management of Chronic Issues    HPI  Noticed HR drops on stationary bike. Did call Dr. Marcille Blanco office and they were not concerned.  She was worried enough she stopped exercising on the bike  Saw Heme-onc in Oct for anemia followup. .    Has upcoming ortho appt for left shoulder injury/dec ROM after a fall around 12/24/23.   For her neck pain, she does see a chiropractor and uses CBD oil and it has really helped her.   Also c/o of right hand tingling on and off, mostly when her arm is raised.  She has hx of OA in neck but pain has been fairly well controlled lately.  No sxs at night.  Occ tingling in her feet as well. Says has been happening more lately.     ROS    Objective:     BP 112/76   Pulse 89   Ht 5\' 2"  (1.575 m)   Wt 181 lb (82.1 kg)   SpO2 95%   BMI 33.11 kg/m    Physical Exam Vitals and nursing note reviewed.  Constitutional:      Appearance: Normal appearance.  HENT:     Head: Normocephalic and atraumatic.  Eyes:     Conjunctiva/sclera: Conjunctivae normal.  Cardiovascular:     Rate and Rhythm: Normal rate and regular rhythm.  Pulmonary:     Effort: Pulmonary effort is normal.     Breath sounds: Normal breath sounds.  Skin:    General: Skin is warm and dry.  Neurological:     Mental Status: She is alert.  Psychiatric:        Mood and Affect: Mood normal.      No results found for any visits on 02/01/23.    The 10-year ASCVD risk score (Arnett DK, et al., 2019) is: 25.1%    Assessment & Plan:   Problem List Items Addressed This Visit       Cardiovascular and Mediastinum   Essential hypertension, benign - Primary   Well controlled. Continue current regimen. Follow up in  6 mo         Endocrine   IFG (impaired fasting glucose)   Lab Results   Component Value Date   HGBA1C 6.2 (H) 12/20/2022   Well controlled. Continue current regimen. Follow up in  40mo       Hypothyroidism   Due to recheck TSH      Relevant Orders   TSH     Musculoskeletal and Integument   Rheumatoid arthritis with rheumatoid factor (HCC) (Chronic)   Declines to be on DMARDs      Spondylosis without myelopathy or radiculopathy, cervical region   Could contribute to tingling in right hand. Consider nerve conduction study.       Other Visit Diagnoses       Right hand paresthesia          Right hand paresthesia - consider nerve conduction testing for further workup vs cock up split for possible carpal tunnel. She says it is her whole hand.  .   No follow-ups on file.    Nani Gasser, MD

## 2023-02-01 NOTE — Assessment & Plan Note (Signed)
Lab Results  Component Value Date   HGBA1C 6.2 (H) 12/20/2022   Well controlled. Continue current regimen. Follow up in  20mo

## 2023-02-01 NOTE — Assessment & Plan Note (Signed)
Could contribute to tingling in right hand. Consider nerve conduction study.

## 2023-02-01 NOTE — Assessment & Plan Note (Signed)
Due to recheck TSH. 

## 2023-02-02 LAB — TSH: TSH: 0.559 u[IU]/mL (ref 0.450–4.500)

## 2023-02-04 ENCOUNTER — Encounter: Payer: PRIVATE HEALTH INSURANCE | Admitting: Family Medicine

## 2023-02-04 ENCOUNTER — Other Ambulatory Visit: Payer: Self-pay

## 2023-02-04 ENCOUNTER — Encounter: Payer: Self-pay | Admitting: Family Medicine

## 2023-02-04 ENCOUNTER — Other Ambulatory Visit: Payer: Self-pay | Admitting: *Deleted

## 2023-02-04 DIAGNOSIS — E038 Other specified hypothyroidism: Secondary | ICD-10-CM

## 2023-02-04 NOTE — Telephone Encounter (Signed)
Spoke with patient -  She has chosen referral to neurology ( pended referral )  Patient states she is wondering if it is circulatory problem. States the numbness is in bilateral hands  and only when raising her hands upward.

## 2023-02-04 NOTE — Progress Notes (Signed)
[name] I want a make a slight adjustment on to your thyroid medication.  I think you are taking a whole tablet daily I would like for you to take just a half of a tablet on Sundays and a whole tablet Monday through Saturday.  That we were only decreasing your total dose for the week by a half of a tab.  Then plan to recheck level in 8 weeks.

## 2023-02-04 NOTE — Telephone Encounter (Signed)
Orders Placed This Encounter  Procedures   Ambulatory referral to Neurology    Referral Priority:   Routine    Referral Type:   Consultation    Referral Reason:   Specialty Services Required    Requested Specialty:   Neurology    Number of Visits Requested:   1    

## 2023-02-04 NOTE — Telephone Encounter (Signed)
Attempted call to patient. Left detailed voice mail message ( allowed on DPR) - requesting a return call with which options she would prefer to proceed with.

## 2023-02-08 NOTE — Progress Notes (Deleted)
 HPI: Follow-up pacemaker, SVT, dyspnea and chest pain. Carotid Dopplers October 2023 showed 1 to 39% right and no stenosis on the left.  Patient apparently presented to the emergency room at [facility] with SVT.  She then developed junctional rhythm and bradycardia and had pacemaker placed.  Cardiac catheterization December 2023 at [facility] showed no coronary disease and normal LVEDP with normal LV function.  Echocardiogram December 2023 showed normal LV function, mild left atrial enlargement.  She has been followed by Dr. Chales Abrahams.  Since last seen,   Current Outpatient Medications  Medication Sig Dispense Refill   albuterol (VENTOLIN HFA) 108 (90 Base) MCG/ACT inhaler INHALE 2 PUFFS INTO THE LUNGS EVERY 6 HOURS AS NEEDED FOR SHORTNESS OF BREATH 8.5 each 5   Ascorbic Acid (VITAMIN C WITH ROSE HIPS) 1000 MG tablet Take 1,000 mg by mouth daily.     budesonide-formoterol (SYMBICORT) 80-4.5 MCG/ACT inhaler Inhale 2 puffs into the lungs in the morning and at bedtime. (Patient taking differently: Inhale 2 puffs into the lungs in the morning and at bedtime. Using daily.) 30.6 each 3   buPROPion (WELLBUTRIN XL) 300 MG 24 hr tablet Take 300 mg by mouth daily.     Calcium Carb-Cholecalciferol (CALCIUM 600+D3 PO) Take 1 tablet by mouth 2 (two) times daily.     Cholecalciferol (VITAMIN D3) 50 MCG (2000 UT) capsule Take 2,000 Units by mouth daily.     clobetasol ointment (TEMOVATE) 0.05 % Apply 1 Application topically 2 (two) times daily as needed. 30 g 1   famotidine (PEPCID) 20 MG tablet Take 20 mg by mouth at bedtime.      fexofenadine (ALLEGRA) 180 MG tablet Take 180 mg by mouth at bedtime.     folic acid (FOLVITE) 1 MG tablet TAKE 2 TABLETS BY MOUTH EVERY DAY (Patient taking differently: Take 1 mg by mouth daily.) 180 tablet 3   Ginkgo Biloba (GNP GINGKO BILOBA EXTRACT PO) Take 1 tablet by mouth 3 (three) times daily with meals.     hydrochlorothiazide (HYDRODIURIL) 25 MG tablet TAKE 1 TABLET BY MOUTH EVERY  DAY 90 tablet 3   lidocaine (LIDODERM) 5 % Place 1 patch onto the skin daily. Remove & Discard patch within 12 hours or as directed by MD (Patient taking differently: Place 1 patch onto the skin daily as needed (pain). Remove & Discard patch within 12 hours or as directed by MD) 90 patch 1   losartan (COZAAR) 50 MG tablet TAKE 1 TABLET BY MOUTH EVERY DAY 90 tablet 3   Magnesium 100 MG TABS Take 100 mg by mouth 2 (two) times a day.     meloxicam (MOBIC) 15 MG tablet TAKE 1 TABLET BY MOUTH EVERY DAY IN THE MORNING 90 tablet 0   metoprolol succinate (TOPROL-XL) 25 MG 24 hr tablet Take 1 tablet by mouth daily.     OVER THE COUNTER MEDICATION Apply 1 Application topically 2 (two) times daily. CBD roll on pain reliever     PAXIL CR 25 MG 24 hr tablet Take 1 tablet (25 mg total) by mouth daily. (Patient taking differently: Take 12.5 mg by mouth daily.) 90 tablet 3   polyethylene glycol (MIRALAX / GLYCOLAX) 17 g packet Take 17 g by mouth daily as needed for mild constipation. (Patient taking differently: Take 8.5 g by mouth daily as needed for mild constipation.) 14 each 0   Propylene Glycol, PF, (SYSTANE COMPLETE PF) 0.6 % SOLN Place 1 drop into both eyes 2 (two) times daily.  SYNTHROID 75 MCG tablet TAKE 1 TABLET BY MOUTH DAILY BEFORE BREAKFAST. 90 tablet 1   TART CHERRY PO Take 2 capsules by mouth daily.     No current facility-administered medications for this visit.     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   Depressive disorder, not elsewhere classified    Esophageal reflux    History of hiatal hernia    Hypertension    Localized  osteoarthrosis not specified whether primary or secondary, lower leg    Other and unspecified hyperlipidemia    Other diseases of vocal cords    Peptic ulcer, unspecified site, unspecified as acute or chronic, without mention of hemorrhage, perforation, or obstruction    Pneumonia    bilat pneumonia 1987   Pre-diabetes    no meds,just diet controll   TMJ (temporomandibular joint disorder)    Unspecified arthropathy, hand    Unspecified asthma(493.90)    triggered with a virus    Unspecified hypothyroidism    Venous stasis ulcer of left lower extremity (HCC) 03/03/2015   By 1/17 biopsy      Past Surgical History:  Procedure Laterality Date   BREAST BIOPSY  09/07/2011   High Risk Lesion   BREAST EXCISIONAL BIOPSY Left    BREAST SURGERY     LUMPECTOMY / LEFT 10/12/2011   KNEE ARTHROSCOPY     bilat    NASAL SINUS SURGERY     ROTATOR CUFF REPAIR     right    thumb surgery      left hand 45 years ago    TOTAL HIP ARTHROPLASTY Right 08/10/2014   Procedure: RIGHT TOTAL HIP ARTHROPLASTY ANTERIOR APPROACH;  Surgeon: Durene Romans, MD;  Location: [facility];  Service: Orthopedics;  Laterality: Right;   TOTAL HIP ARTHROPLASTY Left 08/22/2021   Procedure: TOTAL HIP ARTHROPLASTY ANTERIOR APPROACH;  Surgeon: Durene Romans, MD;  Location: [facility];  Service: Orthopedics;  Laterality: Left;   TOTAL KNEE ARTHROPLASTY Left 08/26/2018   Procedure: TOTAL KNEE ARTHROPLASTY;  Surgeon: Durene Romans, MD;  Location: [facility];  Service: Orthopedics;  Laterality: Left;  70 mins   TOTAL KNEE ARTHROPLASTY Right 07/31/2022   Procedure: TOTAL KNEE ARTHROPLASTY;  Surgeon: Durene Romans, MD;  Location: [facility];  Service: Orthopedics;  Laterality: Right;    Social History   Socioeconomic History   Marital status: Married    Spouse name: Theron Arista   Number of children: 3   Years of education: 16   Highest education level: Bachelor's degree (e.g., BA, AB, BS)  Occupational History   Occupation: Retired    Comment:  Runner, broadcasting/film/video  Tobacco Use   Smoking status: Former    Current packs/day: 0.00    Average packs/day: 0.3 packs/day for 2.0 years (0.6 ttl pk-yrs)    Types: Cigarettes    Start date: 02/13/1956    Quit date: 02/12/1958    Years since quitting: 65.0   Smokeless tobacco: Never  Vaping Use   Vaping status: Never Used  Substance and Sexual Activity   Alcohol use: Yes  Alcohol/week: 1.0 standard drink of alcohol    Types: 1 Glasses of wine per week    Comment: wine socially   Drug use: No   Sexual activity: Yes  Other Topics Concern   Not on file  Social History Narrative   Lives with her husband. She has three children. She enjoys spending time with her grandchildren and talking to her friends. When she does have some free time, she enjoys crocheting.    Social Drivers of Corporate investment banker Strain: Low Risk  (04/02/2022)   Received from [facility], [facility]   Overall Financial Resource Strain (CARDIA)    Difficulty of Paying Living Expenses: Not hard at all  Food Insecurity: No Food Insecurity (07/31/2022)   Hunger Vital Sign    Worried About Running Out of Food in the Last Year: Never true    Ran Out of Food in the Last Year: Never true  Transportation Needs: No Transportation Needs (07/31/2022)   PRAPARE - Administrator, Civil Service (Medical): No    Lack of Transportation (Non-Medical): No  Physical Activity: Inactive (01/30/2022)   Exercise Vital Sign    Days of Exercise per Week: 0 days    Minutes of Exercise per Session: 0 min  Stress: No Stress Concern Present (01/30/2022)   [employer] of Occupational Health - Occupational Stress Questionnaire    Feeling of Stress : Not at all  Social Connections: Socially Integrated (01/30/2022)   Social Connection and Isolation Panel [NHANES]    Frequency of Communication with Friends and Family: More than three times a week    Frequency of Social Gatherings with Friends and Family: More than three times a  week    Attends Religious Services: More than 4 times per year    Active Member of [employer] or Organizations: Yes    Attends Banker Meetings: Never    Marital Status: Married  Catering manager Violence: Not At Risk (07/31/2022)   Humiliation, Afraid, Rape, and Kick questionnaire    Fear of Current or Ex-Partner: No    Emotionally Abused: No    Physically Abused: No    Sexually Abused: No    Family History  Problem Relation Age of Onset   Heart disease Father    Colon cancer Mother    Liver cancer Brother    Asthma Brother     ROS: no fevers or chills, productive cough, hemoptysis, dysphasia, odynophagia, melena, hematochezia, dysuria, hematuria, rash, seizure activity, orthopnea, PND, pedal edema, claudication. Remaining systems are negative.  Physical Exam: Well-developed well-nourished in no acute distress.  Skin is warm and dry.  HEENT is normal.  Neck is supple.  Chest is clear to auscultation with normal expansion.  Cardiovascular exam is regular rate and rhythm.  Abdominal exam nontender or distended. No masses palpated. Extremities show no edema. neuro grossly intact  ECG- personally reviewed  A/P  1 status post pacemaker-  2 history of SVT-  3 history of chest pain-last catheterization revealed no coronary disease.  4 hyperlipidemia-  5 hypertension-  Olga Millers, MD

## 2023-02-18 ENCOUNTER — Ambulatory Visit (INDEPENDENT_AMBULATORY_CARE_PROVIDER_SITE_OTHER): Payer: Medicare Other | Admitting: Family Medicine

## 2023-02-18 ENCOUNTER — Encounter: Payer: Self-pay | Admitting: Family Medicine

## 2023-02-18 VITALS — BP 127/72 | HR 89 | Temp 98.1°F | Ht 62.0 in | Wt 179.0 lb

## 2023-02-18 DIAGNOSIS — J0110 Acute frontal sinusitis, unspecified: Secondary | ICD-10-CM

## 2023-02-18 MED ORDER — AMOXICILLIN 875 MG PO TABS: 875.0000 mg | ORAL_TABLET | Freq: Two times a day (BID) | ORAL | 0 refills | Status: DC

## 2023-02-18 NOTE — Progress Notes (Signed)
   Acute Office Visit  Subjective:     Patient ID: [name], female    DOB: [birth year], 78 y.o.   MRN: [id number]  Chief Complaint  Patient presents with   Sinusitis    HPI Patient is in today for not feeling well for a little over a week.  She says it started with a sore throat and then developed nasal congestion and productive cough.  She is pretty sure that she got it from her daughter who was visiting for the holidays that she had similar symptoms.  No fevers or chills.  She thought maybe she would just get better but she actually feels like she is getting a little worse so came in today.  She ran a low-grade fever as well.  She has been using her Symbicort  and has had to use her albuterol  a couple of times.  He has a frontal headache and fatigue  ROS      Objective:    BP 127/72   Pulse 89   Temp 98.1 F (36.7 C)   Ht 5' 2 (1.575 m)   Wt 179 lb (81.2 kg)   SpO2 97%   BMI 32.74 kg/m    Physical Exam Constitutional:      Appearance: Normal appearance.  HENT:     Head: Normocephalic and atraumatic.     Right Ear: Tympanic membrane, ear canal and external ear normal. There is no impacted cerumen.     Left Ear: Tympanic membrane, ear canal and external ear normal. There is no impacted cerumen.     Nose: Nose normal.     Mouth/Throat:     Pharynx: Oropharynx is clear.  Eyes:     Conjunctiva/sclera: Conjunctivae normal.  Cardiovascular:     Rate and Rhythm: Normal rate and regular rhythm.  Pulmonary:     Effort: Pulmonary effort is normal.     Breath sounds: Normal breath sounds.  Musculoskeletal:     Cervical back: Neck supple. No tenderness.  Lymphadenopathy:     Cervical: No cervical adenopathy.  Skin:    General: Skin is warm and dry.  Neurological:     Mental Status: She is alert and oriented to person, place, and time.  Psychiatric:        Mood and Affect: Mood normal.     No results found for any visits on 02/18/23.      Assessment &  Plan:   Problem List Items Addressed This Visit   None Visit Diagnoses       Acute non-recurrent frontal sinusitis    -  Primary   Relevant Medications   amoxicillin  (AMOXIL ) 875 MG tablet     Acute sinusitis-we will treat with amoxicillin  continue symptomatic care continue to use albuterol  as needed.  Chest is clear on exam.  Continue current regimen and call if not better in 1 week.  Meds ordered this encounter  Medications   amoxicillin  (AMOXIL ) 875 MG tablet    Sig: Take 1 tablet (875 mg total) by mouth 2 (two) times daily.    Dispense:  14 tablet    Refill:  0    No follow-ups on file.  Dorothyann Byars, MD

## 2023-02-19 DIAGNOSIS — F410 Panic disorder [episodic paroxysmal anxiety] without agoraphobia: Secondary | ICD-10-CM | POA: Diagnosis not present

## 2023-02-19 DIAGNOSIS — F422 Mixed obsessional thoughts and acts: Secondary | ICD-10-CM | POA: Diagnosis not present

## 2023-02-19 DIAGNOSIS — F902 Attention-deficit hyperactivity disorder, combined type: Secondary | ICD-10-CM | POA: Diagnosis not present

## 2023-02-20 ENCOUNTER — Ambulatory Visit: Payer: Medicare Other | Admitting: Cardiology

## 2023-02-20 ENCOUNTER — Ambulatory Visit: Payer: Medicare Other | Attending: Cardiology | Admitting: Cardiology

## 2023-02-20 ENCOUNTER — Encounter: Payer: Self-pay | Admitting: Cardiology

## 2023-02-20 VITALS — BP 102/58 | HR 93 | Ht 62.0 in | Wt 180.0 lb

## 2023-02-20 DIAGNOSIS — R0600 Dyspnea, unspecified: Secondary | ICD-10-CM | POA: Diagnosis not present

## 2023-02-20 DIAGNOSIS — I700 Atherosclerosis of aorta: Secondary | ICD-10-CM | POA: Diagnosis not present

## 2023-02-20 DIAGNOSIS — R0789 Other chest pain: Secondary | ICD-10-CM | POA: Diagnosis not present

## 2023-02-20 NOTE — Patient Instructions (Signed)
   Follow-Up: At [facility], you and your health needs are our priority.  As part of our continuing mission to provide you with exceptional heart care, we have created designated Provider Care Teams.  These Care Teams include your primary Cardiologist (physician) and Advanced Practice Providers (APPs -  Physician Assistants and Nurse Practitioners) who all work together to provide you with the care you need, when you need it.    Your next appointment:   6 month(s)  Provider:   Olga Millers, MD

## 2023-02-20 NOTE — Progress Notes (Signed)
 HPI: Follow-up dyspnea and chest pain.  Abdominal CT August 2021 showed aortic atherosclerosis but no aneurysm. Carotid Dopplers October 2023 showed 1 to 39% on the right and no stenosis on the left.  Cardiac catheterization December 2023 showed normal LV function and no coronary disease.  Echocardiogram December 2023 showed normal LV function.  Patient had pacemaker placement at that time.  Since since last seen, she has chronic dyspnea unchanged.  She has occasional chest pain which she feels anxious but otherwise no symptoms.  She denies syncope.  She is concerned about her pacemaker.  Current Outpatient Medications  Medication Sig Dispense Refill   albuterol  (VENTOLIN  HFA) 108 (90 Base) MCG/ACT inhaler INHALE 2 PUFFS INTO THE LUNGS EVERY 6 HOURS AS NEEDED FOR SHORTNESS OF BREATH 8.5 each 5   amoxicillin  (AMOXIL ) 875 MG tablet Take 1 tablet (875 mg total) by mouth 2 (two) times daily. 14 tablet 0   Ascorbic Acid (VITAMIN C WITH ROSE HIPS) 1000 MG tablet Take 1,000 mg by mouth daily.     budesonide -formoterol  (SYMBICORT ) 80-4.5 MCG/ACT inhaler Inhale 2 puffs into the lungs in the morning and at bedtime. (Patient taking differently: Inhale 2 puffs into the lungs in the morning and at bedtime. Using daily.) 30.6 each 3   buPROPion  (WELLBUTRIN  XL) 300 MG 24 hr tablet Take 300 mg by mouth daily.     Calcium  Carb-Cholecalciferol (CALCIUM  600+D3 PO) Take 1 tablet by mouth 2 (two) times daily.     Cholecalciferol (VITAMIN D3) 50 MCG (2000 UT) capsule Take 2,000 Units by mouth daily.     clobetasol  ointment (TEMOVATE ) 0.05 % Apply 1 Application topically 2 (two) times daily as needed. 30 g 1   famotidine  (PEPCID ) 20 MG tablet Take 20 mg by mouth at bedtime.      fexofenadine  (ALLEGRA ) 180 MG tablet Take 180 mg by mouth at bedtime.     folic acid  (FOLVITE ) 1 MG tablet TAKE 2 TABLETS BY MOUTH EVERY DAY (Patient taking differently: Take 1 mg by mouth daily.) 180 tablet 3   Ginkgo Biloba (GNP GINGKO  BILOBA EXTRACT PO) Take 1 tablet by mouth 3 (three) times daily with meals.     hydrochlorothiazide  (HYDRODIURIL ) 25 MG tablet TAKE 1 TABLET BY MOUTH EVERY DAY 90 tablet 3   lidocaine  (LIDODERM ) 5 % Place 1 patch onto the skin daily. Remove & Discard patch within 12 hours or as directed by MD (Patient taking differently: Place 1 patch onto the skin daily as needed (pain). Remove & Discard patch within 12 hours or as directed by MD) 90 patch 1   losartan  (COZAAR ) 50 MG tablet TAKE 1 TABLET BY MOUTH EVERY DAY 90 tablet 3   Magnesium  100 MG TABS Take 100 mg by mouth 2 (two) times a day.     meloxicam  (MOBIC ) 15 MG tablet TAKE 1 TABLET BY MOUTH EVERY DAY IN THE MORNING 90 tablet 0   metoprolol  succinate (TOPROL -XL) 25 MG 24 hr tablet Take 1 tablet by mouth daily.     OVER THE COUNTER MEDICATION Apply 1 Application topically 2 (two) times daily. CBD roll on pain reliever     PAXIL  CR 25 MG 24 hr tablet Take 1 tablet (25 mg total) by mouth daily. (Patient taking differently: Take 12.5 mg by mouth daily.) 90 tablet 3   polyethylene glycol (MIRALAX  / GLYCOLAX ) 17 g packet Take 17 g by mouth daily as needed for mild constipation. (Patient taking differently: Take 8.5 g by mouth daily  as needed for mild constipation.) 14 each 0   Propylene Glycol, PF, (SYSTANE COMPLETE PF) 0.6 % SOLN Place 1 drop into both eyes 2 (two) times daily.     SYNTHROID  75 MCG tablet TAKE 1 TABLET BY MOUTH DAILY BEFORE BREAKFAST. 90 tablet 1   TART CHERRY PO Take 2 capsules by mouth daily.     No current facility-administered medications for this visit.     Past Medical History:  Diagnosis Date   Allergic rhinitis, cause unspecified    Anemia    iron   Anxiety    Asthma    Benign paroxysmal positional vertigo    Cancer (HCC)    basal cell carcinoma per right side of nostril   CHF (congestive heart failure) (HCC) 11/09/2016   had 2+ edema in legs with anxiety and SOB   Complication of anesthesia    has 4  degenerative  discs in neck.   Depressive disorder, not elsewhere classified    Esophageal reflux    History of hiatal hernia    Hypertension    Localized osteoarthrosis not specified whether primary or secondary, lower leg    Other and unspecified hyperlipidemia    Other diseases of vocal cords    Peptic ulcer, unspecified site, unspecified as acute or chronic, without mention of hemorrhage, perforation, or obstruction    Pneumonia    bilat pneumonia 1987   Pre-diabetes    no meds,just diet controll   TMJ (temporomandibular joint disorder)    Unspecified arthropathy, hand    Unspecified asthma(493.90)    triggered with a virus    Unspecified hypothyroidism    Venous stasis ulcer of left lower extremity (HCC) 03/03/2015   By 1/17 biopsy      Past Surgical History:  Procedure Laterality Date   BREAST BIOPSY  09/07/2011   High Risk Lesion   BREAST EXCISIONAL BIOPSY Left    BREAST SURGERY     LUMPECTOMY / LEFT 10/12/2011   KNEE ARTHROSCOPY     bilat    NASAL SINUS SURGERY     ROTATOR CUFF REPAIR     right    thumb surgery      left hand 45 years ago    TOTAL HIP ARTHROPLASTY Right 08/10/2014   Procedure: RIGHT TOTAL HIP ARTHROPLASTY ANTERIOR APPROACH;  Surgeon: Donnice Car, MD;  Location: [facility];  Service: Orthopedics;  Laterality: Right;   TOTAL HIP ARTHROPLASTY Left 08/22/2021   Procedure: TOTAL HIP ARTHROPLASTY ANTERIOR APPROACH;  Surgeon: Car Donnice, MD;  Location: [facility];  Service: Orthopedics;  Laterality: Left;   TOTAL KNEE ARTHROPLASTY Left 08/26/2018   Procedure: TOTAL KNEE ARTHROPLASTY;  Surgeon: Car Donnice, MD;  Location: [facility];  Service: Orthopedics;  Laterality: Left;  70 mins   TOTAL KNEE ARTHROPLASTY Right 07/31/2022   Procedure: TOTAL KNEE ARTHROPLASTY;  Surgeon: Car Donnice, MD;  Location: [facility];  Service: Orthopedics;  Laterality: Right;    Social History   Socioeconomic History   Marital status: Married    Spouse name: Maude   Number of children: 3   Years  of education: 16   Highest education level: Bachelor's degree (e.g., BA, AB, BS)  Occupational History   Occupation: Retired    Comment: runner, broadcasting/film/video  Tobacco Use   Smoking status: Former    Current packs/day: 0.00    Average packs/day: 0.3 packs/day for 2.0 years (0.6 ttl pk-yrs)    Types: Cigarettes    Start date: 02/13/1956    Quit date: 02/12/1958  Years since quitting: 65.0   Smokeless tobacco: Never  Vaping Use   Vaping status: Never Used  Substance and Sexual Activity   Alcohol use: Yes    Alcohol/week: 1.0 standard drink of alcohol    Types: 1 Glasses of wine per week    Comment: wine socially   Drug use: No   Sexual activity: Yes  Other Topics Concern   Not on file  Social History Narrative   Lives with her husband. She has three children. She enjoys spending time with her grandchildren and talking to her friends. When she does have some free time, she enjoys crocheting.    Social Drivers of Corporate Investment Banker Strain: Low Risk  (04/02/2022)   Received from [facility], [facility]   Overall Financial Resource Strain (CARDIA)    Difficulty of Paying Living Expenses: Not hard at all  Food Insecurity: No Food Insecurity (07/31/2022)   Hunger Vital Sign    Worried About Running Out of Food in the Last Year: Never true    Ran Out of Food in the Last Year: Never true  Transportation Needs: No Transportation Needs (07/31/2022)   PRAPARE - Administrator, Civil Service (Medical): No    Lack of Transportation (Non-Medical): No  Physical Activity: Inactive (01/30/2022)   Exercise Vital Sign    Days of Exercise per Week: 0 days    Minutes of Exercise per Session: 0 min  Stress: No Stress Concern Present (01/30/2022)   [employer] of Occupational Health - Occupational Stress Questionnaire    Feeling of Stress : Not at all  Social Connections: Socially Integrated (01/30/2022)   Social Connection and Isolation Panel [NHANES]    Frequency of  Communication with Friends and Family: More than three times a week    Frequency of Social Gatherings with Friends and Family: More than three times a week    Attends Religious Services: More than 4 times per year    Active Member of [employer] or Organizations: Yes    Attends Banker Meetings: Never    Marital Status: Married  Catering Manager Violence: Not At Risk (07/31/2022)   Humiliation, Afraid, Rape, and Kick questionnaire    Fear of Current or Ex-Partner: No    Emotionally Abused: No    Physically Abused: No    Sexually Abused: No    Family History  Problem Relation Age of Onset   Heart disease Father    Colon cancer Mother    Liver cancer Brother    Asthma Brother     ROS: no fevers or chills, productive cough, hemoptysis, dysphasia, odynophagia, melena, hematochezia, dysuria, hematuria, rash, seizure activity, orthopnea, PND, pedal edema, claudication. Remaining systems are negative.  Physical Exam: Well-developed well-nourished in no acute distress.  Skin is warm and dry.  HEENT is normal.  Neck is supple.  Chest is clear to auscultation with normal expansion.  Cardiovascular exam is regular rate and rhythm.  Abdominal exam nontender or distended. No masses palpated. Extremities show no edema. neuro grossly intact  EKG Interpretation Date/Time:  Wednesday February 20 2023 10:29:22 EST Ventricular Rate:  93 PR Interval:  80 QRS Duration:  186 QT Interval:  446 QTC Calculation: 554 R Axis:   -69  Text Interpretation: AV dual-paced rhythm No previous ECGs available Confirmed by Pietro Rogue ([id number]) on 02/20/2023 10:35:25 AM    A/P  1 dyspnea-patient is not volume overloaded on exam and this is a chronic issue.  I will  LV function normal following last echocardiogram.  2 chest pain-symptoms are atypical.  Last catheterization revealed no coronary disease.  Will not pursue further ischemia evaluation.  3 pacemaker-followed by Dr. Lilian.  4  Hypertension-patient's blood pressure is controlled.  Continue present medications.  Redell Shallow, MD

## 2023-02-25 DIAGNOSIS — D225 Melanocytic nevi of trunk: Secondary | ICD-10-CM | POA: Diagnosis not present

## 2023-02-25 DIAGNOSIS — D3617 Benign neoplasm of peripheral nerves and autonomic nervous system of trunk, unspecified: Secondary | ICD-10-CM | POA: Diagnosis not present

## 2023-02-25 DIAGNOSIS — Z85828 Personal history of other malignant neoplasm of skin: Secondary | ICD-10-CM | POA: Diagnosis not present

## 2023-02-25 DIAGNOSIS — L821 Other seborrheic keratosis: Secondary | ICD-10-CM | POA: Diagnosis not present

## 2023-02-25 DIAGNOSIS — D1801 Hemangioma of skin and subcutaneous tissue: Secondary | ICD-10-CM | POA: Diagnosis not present

## 2023-02-26 DIAGNOSIS — M25512 Pain in left shoulder: Secondary | ICD-10-CM | POA: Diagnosis not present

## 2023-03-07 DIAGNOSIS — M05742 Rheumatoid arthritis with rheumatoid factor of left hand without organ or systems involvement: Secondary | ICD-10-CM | POA: Diagnosis not present

## 2023-03-07 DIAGNOSIS — M79642 Pain in left hand: Secondary | ICD-10-CM | POA: Diagnosis not present

## 2023-03-07 DIAGNOSIS — M79645 Pain in left finger(s): Secondary | ICD-10-CM | POA: Diagnosis not present

## 2023-03-09 ENCOUNTER — Other Ambulatory Visit: Payer: Self-pay | Admitting: Family Medicine

## 2023-03-11 DIAGNOSIS — I495 Sick sinus syndrome: Secondary | ICD-10-CM | POA: Diagnosis not present

## 2023-03-18 ENCOUNTER — Encounter: Payer: PRIVATE HEALTH INSURANCE | Admitting: Family Medicine

## 2023-03-18 DIAGNOSIS — H2513 Age-related nuclear cataract, bilateral: Secondary | ICD-10-CM | POA: Diagnosis not present

## 2023-03-19 DIAGNOSIS — F902 Attention-deficit hyperactivity disorder, combined type: Secondary | ICD-10-CM | POA: Diagnosis not present

## 2023-03-19 DIAGNOSIS — F422 Mixed obsessional thoughts and acts: Secondary | ICD-10-CM | POA: Diagnosis not present

## 2023-03-19 DIAGNOSIS — F410 Panic disorder [episodic paroxysmal anxiety] without agoraphobia: Secondary | ICD-10-CM | POA: Diagnosis not present

## 2023-03-20 DIAGNOSIS — M25512 Pain in left shoulder: Secondary | ICD-10-CM | POA: Diagnosis not present

## 2023-03-26 DIAGNOSIS — M25512 Pain in left shoulder: Secondary | ICD-10-CM | POA: Diagnosis not present

## 2023-04-02 DIAGNOSIS — M25512 Pain in left shoulder: Secondary | ICD-10-CM | POA: Diagnosis not present

## 2023-04-04 DIAGNOSIS — M25512 Pain in left shoulder: Secondary | ICD-10-CM | POA: Diagnosis not present

## 2023-04-11 ENCOUNTER — Other Ambulatory Visit (HOSPITAL_BASED_OUTPATIENT_CLINIC_OR_DEPARTMENT_OTHER): Payer: Self-pay

## 2023-04-11 ENCOUNTER — Ambulatory Visit (HOSPITAL_BASED_OUTPATIENT_CLINIC_OR_DEPARTMENT_OTHER): Payer: Medicare Other

## 2023-04-11 DIAGNOSIS — J4530 Mild persistent asthma, uncomplicated: Secondary | ICD-10-CM | POA: Diagnosis not present

## 2023-04-11 LAB — PULMONARY FUNCTION TEST
DL/VA % pred: 103 %
DL/VA: 4.25 ml/min/mmHg/L
DLCO cor % pred: 87 %
DLCO cor: 16.16 ml/min/mmHg
DLCO unc % pred: 87 %
DLCO unc: 16.16 ml/min/mmHg
FEF 25-75 Post: 1.95 L/s
FEF 25-75 Pre: 1.58 L/s
FEF2575-%Change-Post: 23 %
FEF2575-%Pred-Post: 136 %
FEF2575-%Pred-Pre: 110 %
FEV1-%Change-Post: 3 %
FEV1-%Pred-Post: 98 %
FEV1-%Pred-Pre: 95 %
FEV1-Post: 1.9 L
FEV1-Pre: 1.83 L
FEV1FVC-%Change-Post: 5 %
FEV1FVC-%Pred-Pre: 105 %
FEV6-%Change-Post: -3 %
FEV6-%Pred-Post: 92 %
FEV6-%Pred-Pre: 96 %
FEV6-Post: 2.27 L
FEV6-Pre: 2.36 L
FEV6FVC-%Pred-Post: 105 %
FEV6FVC-%Pred-Pre: 105 %
FVC-%Change-Post: -1 %
FVC-%Pred-Post: 89 %
FVC-%Pred-Pre: 90 %
FVC-Post: 2.33 L
FVC-Pre: 2.36 L
Post FEV1/FVC ratio: 82 %
Post FEV6/FVC ratio: 100 %
Pre FEV1/FVC ratio: 78 %
Pre FEV6/FVC Ratio: 100 %
RV % pred: 73 %
RV: 1.73 L
TLC % pred: 86 %
TLC: 4.29 L

## 2023-04-11 NOTE — Patient Instructions (Signed)
 Full PFT Performed Today

## 2023-04-11 NOTE — Progress Notes (Signed)
 Full PFT Performed Today

## 2023-04-12 DIAGNOSIS — M25512 Pain in left shoulder: Secondary | ICD-10-CM | POA: Diagnosis not present

## 2023-04-16 ENCOUNTER — Ambulatory Visit: Payer: Medicare Other

## 2023-04-16 VITALS — Ht 63.0 in | Wt 177.0 lb

## 2023-04-16 DIAGNOSIS — Z780 Asymptomatic menopausal state: Secondary | ICD-10-CM | POA: Diagnosis not present

## 2023-04-16 DIAGNOSIS — F422 Mixed obsessional thoughts and acts: Secondary | ICD-10-CM | POA: Diagnosis not present

## 2023-04-16 DIAGNOSIS — F410 Panic disorder [episodic paroxysmal anxiety] without agoraphobia: Secondary | ICD-10-CM | POA: Diagnosis not present

## 2023-04-16 DIAGNOSIS — Z0000 Encounter for general adult medical examination without abnormal findings: Secondary | ICD-10-CM

## 2023-04-16 DIAGNOSIS — Z13820 Encounter for screening for osteoporosis: Secondary | ICD-10-CM

## 2023-04-16 DIAGNOSIS — F902 Attention-deficit hyperactivity disorder, combined type: Secondary | ICD-10-CM | POA: Diagnosis not present

## 2023-04-16 NOTE — Patient Instructions (Signed)
  [name] , Thank you for taking time to come for your Medicare Wellness Visit. I appreciate your ongoing commitment to your health goals. Please review the following plan we discussed and let me know if I can assist you in the future.   These are the goals we discussed:  Goals       Patient Stated (pt-stated)      11/25/2020 AWV Goal: Exercise for General Health  Patient will verbalize understanding of the benefits of increased physical activity: Exercising regularly is important. It will improve your overall fitness, flexibility, and endurance. Regular exercise also will improve your overall health. It can help you control your weight, reduce stress, and improve your bone density. Over the next year, patient will increase physical activity as tolerated with a goal of at least 150 minutes of moderate physical activity per week.  You can tell that you are exercising at a moderate intensity if your heart starts beating faster and you start breathing faster but can still hold a conversation. Moderate-intensity exercise ideas include: Walking 1 mile (1.6 km) in about 15 minutes Biking Hiking Golfing Dancing Water aerobics Patient will verbalize understanding of everyday activities that increase physical activity by providing examples like the following: Yard work, such as: Insurance underwriter Gardening Washing windows or floors Patient will be able to explain general safety guidelines for exercising:  Before you start a new exercise program, talk with your health care provider. Do not exercise so much that you hurt yourself, feel dizzy, or get very short of breath. Wear comfortable clothes and wear shoes with good support. Drink plenty of water while you exercise to prevent dehydration or heat stroke. Work out until your breathing and your heartbeat get faster.       Patient Stated (pt-stated)       Patient would like to get stronger and build her strength.      Weight (lb) < 200 lb (90.7 kg)      Would like to loose 40 lbs this year      Weight (lb) < 200 lb (90.7 kg)      She would like to lose 20 lbs and keep it off.         This is a list of the screening recommended for you and due dates:  Health Maintenance  Topic Date Due   Zoster (Shingles) Vaccine (1 of 2) Never done   COVID-19 Vaccine (3 - Pfizer risk series) 05/13/2019   DTaP/Tdap/Td vaccine (4 - Td or Tdap) 09/24/2022   Medicare Annual Wellness Visit  04/15/2024   Mammogram  05/01/2024   Colon Cancer Screening  08/05/2024   Pneumonia Vaccine  Completed   Flu Shot  Completed   DEXA scan (bone density measurement)  Completed   Hepatitis C Screening  Completed   HPV Vaccine  Aged Out

## 2023-04-16 NOTE — Progress Notes (Signed)
 Subjective:   [name] is a 78 y.o. female who presents for Medicare Annual (Subsequent) preventive examination.  Visit Complete: Virtual I connected with  [name] on 04/16/23 by a audio enabled telemedicine application and verified that I am speaking with the correct person using two identifiers.  Patient Location: Home  Provider Location: Office/Clinic  I discussed the limitations of evaluation and management by telemedicine. The patient expressed understanding and agreed to proceed.  Vital Signs: Because this visit was a virtual/telehealth visit, some criteria may be missing or patient reported. Any vitals not documented were not able to be obtained and vitals that have been documented are patient reported.  Patient Medicare AWV questionnaire was completed by the patient on n/a; I have confirmed that all information answered by patient is correct and no changes since this date.  Cardiac Risk Factors include: advanced age (>42men, >49 women);dyslipidemia;hypertension;smoking/ tobacco exposure;obesity (BMI >30kg/m2)     Objective:    Today's Vitals   04/16/23 1422  Weight: 177 lb (80.3 kg)  Height: 5\' 3"  (1.6 m)   Body mass index is 31.35 kg/m.     04/16/2023    2:32 PM 12/12/2022    2:25 PM 07/31/2022    6:20 PM 07/31/2022   12:00 PM 07/23/2022    2:41 PM 06/04/2022    1:27 PM 02/07/2022    2:58 PM  Advanced Directives  Does Patient Have a Medical Advance Directive? Yes Yes Yes Yes Yes No Yes  Type of Estate agent of [place];Living will Living will;Healthcare Power of [employer] Power of [place];Living will Healthcare Power of [employer] Power of Attorney  Living will  Does patient want to make changes to medical advance directive? No - Patient declined  No - Patient declined   No - Patient declined   Copy of Healthcare Power of Attorney in Chart? No - copy requested No - copy requested Yes - validated most recent  copy scanned in chart (See row information) No - copy requested No - copy requested No - copy requested     Current Medications (verified) Outpatient Encounter Medications as of 04/16/2023  Medication Sig   albuterol (VENTOLIN HFA) 108 (90 Base) MCG/ACT inhaler INHALE 2 PUFFS INTO THE LUNGS EVERY 6 HOURS AS NEEDED FOR SHORTNESS OF BREATH   Ascorbic Acid (VITAMIN C WITH ROSE HIPS) 1000 MG tablet Take 1,000 mg by mouth daily.   budesonide-formoterol (SYMBICORT) 80-4.5 MCG/ACT inhaler Inhale 2 puffs into the lungs in the morning and at bedtime. (Patient taking differently: Inhale 2 puffs into the lungs in the morning and at bedtime. Using daily.)   buPROPion (WELLBUTRIN XL) 300 MG 24 hr tablet Take 300 mg by mouth daily.   Calcium Carb-Cholecalciferol (CALCIUM 600+D3 PO) Take 1 tablet by mouth 2 (two) times daily.   Cholecalciferol (VITAMIN D3) 50 MCG (2000 UT) capsule Take 2,000 Units by mouth daily.   clobetasol ointment (TEMOVATE) 0.05 % Apply 1 Application topically 2 (two) times daily as needed.   famotidine (PEPCID) 20 MG tablet Take 20 mg by mouth at bedtime.    fexofenadine (ALLEGRA) 180 MG tablet Take 180 mg by mouth at bedtime.   folic acid (FOLVITE) 1 MG tablet TAKE 2 TABLETS BY MOUTH EVERY DAY (Patient taking differently: Take 1 mg by mouth daily.)   Ginkgo Biloba (GNP GINGKO BILOBA EXTRACT PO) Take 1 tablet by mouth 3 (three) times daily with meals.   hydrochlorothiazide (HYDRODIURIL) 25 MG tablet TAKE 1 TABLET BY MOUTH EVERY  DAY   lidocaine (LIDODERM) 5 % Place 1 patch onto the skin daily. Remove & Discard patch within 12 hours or as directed by MD (Patient taking differently: Place 1 patch onto the skin daily as needed (pain). Remove & Discard patch within 12 hours or as directed by MD)   losartan (COZAAR) 50 MG tablet TAKE 1 TABLET BY MOUTH EVERY DAY   Magnesium 100 MG TABS Take 100 mg by mouth 2 (two) times a day.   meloxicam (MOBIC) 15 MG tablet TAKE 1 TABLET BY MOUTH EVERY DAY IN  THE MORNING   metoprolol succinate (TOPROL-XL) 25 MG 24 hr tablet Take 1 tablet by mouth daily.   OVER THE COUNTER MEDICATION Apply 1 Application topically 2 (two) times daily. CBD roll on pain reliever   PAXIL CR 25 MG 24 hr tablet Take 1 tablet (25 mg total) by mouth daily. (Patient taking differently: Take 12.5 mg by mouth daily.)   polyethylene glycol (MIRALAX / GLYCOLAX) 17 g packet Take 17 g by mouth daily as needed for mild constipation. (Patient taking differently: Take 8.5 g by mouth daily as needed for mild constipation.)   Propylene Glycol, PF, (SYSTANE COMPLETE PF) 0.6 % SOLN Place 1 drop into both eyes 2 (two) times daily.   SYNTHROID 75 MCG tablet TAKE 1 TABLET BY MOUTH DAILY BEFORE BREAKFAST.   TART CHERRY PO Take 2 capsules by mouth daily.   [DISCONTINUED] amoxicillin (AMOXIL) 875 MG tablet Take 1 tablet (875 mg total) by mouth 2 (two) times daily.   No facility-administered encounter medications on file as of 04/16/2023.    Allergies (verified) Moxifloxacin, Other, Astelin [azelastine hcl], Celebrex [celecoxib], Lisinopril, Methotrexate derivatives, and Morphine   History: Past Medical History:  Diagnosis Date   Allergic rhinitis, cause unspecified    Anemia    iron   Anxiety    Asthma    Benign paroxysmal positional vertigo    Cancer (HCC)    basal cell carcinoma per right side of nostril   CHF (congestive heart failure) (HCC) 11/09/2016   had 2+ edema in legs with anxiety and SOB   Complication of anesthesia    has 4  degenerative discs in neck.   Depressive disorder, not elsewhere classified    Esophageal reflux    History of hiatal hernia    Hypertension    Localized osteoarthrosis not specified whether primary or secondary, lower leg    Other and unspecified hyperlipidemia    Other diseases of vocal cords    Peptic ulcer, unspecified site, unspecified as acute or chronic, without mention of hemorrhage, perforation, or obstruction    Pneumonia    bilat  pneumonia 1987   Pre-diabetes    no meds,just diet controll   TMJ (temporomandibular joint disorder)    Unspecified arthropathy, hand    Unspecified asthma(493.90)    triggered with a virus    Unspecified hypothyroidism    Venous stasis ulcer of left lower extremity (HCC) 03/03/2015   By 1/17 biopsy     Past Surgical History:  Procedure Laterality Date   BREAST BIOPSY  09/07/2011   High Risk Lesion   BREAST EXCISIONAL BIOPSY Left    BREAST SURGERY     LUMPECTOMY / LEFT 10/12/2011   KNEE ARTHROSCOPY     bilat    NASAL SINUS SURGERY     ROTATOR CUFF REPAIR     right    thumb surgery      left hand 45 years ago  TOTAL HIP ARTHROPLASTY Right 08/10/2014   Procedure: RIGHT TOTAL HIP ARTHROPLASTY ANTERIOR APPROACH;  Surgeon: Durene Romans, MD;  Location: [facility];  Service: Orthopedics;  Laterality: Right;   TOTAL HIP ARTHROPLASTY Left 08/22/2021   Procedure: TOTAL HIP ARTHROPLASTY ANTERIOR APPROACH;  Surgeon: Durene Romans, MD;  Location: [facility];  Service: Orthopedics;  Laterality: Left;   TOTAL KNEE ARTHROPLASTY Left 08/26/2018   Procedure: TOTAL KNEE ARTHROPLASTY;  Surgeon: Durene Romans, MD;  Location: [facility];  Service: Orthopedics;  Laterality: Left;  70 mins   TOTAL KNEE ARTHROPLASTY Right 07/31/2022   Procedure: TOTAL KNEE ARTHROPLASTY;  Surgeon: Durene Romans, MD;  Location: [facility];  Service: Orthopedics;  Laterality: Right;   Family History  Problem Relation Age of Onset   Heart disease Father    Colon cancer Mother    Liver cancer Brother    Asthma Brother    Social History   Socioeconomic History   Marital status: Married    Spouse name: Theron Arista   Number of children: 3   Years of education: 16   Highest education level: Bachelor's degree (e.g., BA, AB, BS)  Occupational History   Occupation: Retired    Comment: Runner, broadcasting/film/video  Tobacco Use   Smoking status: Former    Current packs/day: 0.00    Average packs/day: 0.3 packs/day for 2.0 years (0.6 ttl pk-yrs)    Types:  Cigarettes    Start date: 02/13/1956    Quit date: 02/12/1958    Years since quitting: 65.2   Smokeless tobacco: Never  Vaping Use   Vaping status: Never Used  Substance and Sexual Activity   Alcohol use: Yes    Alcohol/week: 1.0 standard drink of alcohol    Types: 1 Glasses of wine per week    Comment: wine socially   Drug use: No   Sexual activity: Yes  Other Topics Concern   Not on file  Social History Narrative   Lives with her husband. She has three children. She enjoys spending time with her grandchildren and talking to her friends. When she does have some free time, she enjoys crocheting.    Social Drivers of Corporate investment banker Strain: Low Risk  (04/16/2023)   Overall Financial Resource Strain (CARDIA)    Difficulty of Paying Living Expenses: Not hard at all  Food Insecurity: No Food Insecurity (04/16/2023)   Hunger Vital Sign    Worried About Running Out of Food in the Last Year: Never true    Ran Out of Food in the Last Year: Never true  Transportation Needs: No Transportation Needs (04/16/2023)   PRAPARE - Administrator, Civil Service (Medical): No    Lack of Transportation (Non-Medical): No  Physical Activity: Insufficiently Active (04/16/2023)   Exercise Vital Sign    Days of Exercise per Week: 2 days    Minutes of Exercise per Session: 60 min  Stress: No Stress Concern Present (04/16/2023)   [employer] of Occupational Health - Occupational Stress Questionnaire    Feeling of Stress : Only a little  Social Connections: Socially Integrated (04/16/2023)   Social Connection and Isolation Panel [NHANES]    Frequency of Communication with Friends and Family: More than three times a week    Frequency of Social Gatherings with Friends and Family: More than three times a week    Attends Religious Services: More than 4 times per year    Active Member of [employer] or Organizations: Yes    Attends Banker Meetings:  More than 4 times per year     Marital Status: Married    Tobacco Counseling Counseling given: Not Answered   Clinical Intake:  Pre-visit preparation completed: Yes  Pain : No/denies pain     BMI - recorded: 31.35 Nutritional Status: BMI > 30  Obese Nutritional Risks: None Diabetes: No  How often do you need to have someone help you when you read instructions, pamphlets, or other written materials from your doctor or pharmacy?: 1 - Never What is the last grade level you completed in school?: 16  Interpreter Needed?: No      Activities of Daily Living    04/16/2023    2:24 PM 07/31/2022    6:20 PM  In your present state of health, do you have any difficulty performing the following activities:  Hearing? 0 0  Vision? 0 0  Difficulty concentrating or making decisions? 0 0  Walking or climbing stairs? 0 1  Dressing or bathing? 0 0  Doing errands, shopping? 0 0  Preparing Food and eating ? N   Using the Toilet? N   In the past six months, have you accidently leaked urine? N   Do you have problems with loss of bowel control? N   Managing your Medications? N   Managing your Finances? N   Housekeeping or managing your Housekeeping? N     Patient Care Team: Agapito Games, MD as PCP - General (Family Medicine) Jens Som Madolyn Frieze, MD as PCP - Cardiology (Cardiology) Storm Frisk, MD as Attending Physician (Pulmonary Disease) Altheimer, Casimiro Needle, MD as Attending Physician (Endocrinology) Mckinley Jewel, MD as Referring Physician (Ophthalmology) Jens Som Madolyn Frieze, MD as Consulting Physician (Cardiology)  Indicate any recent Medical Services you may have received from other than [facility] providers in the past year (date may be approximate).     Assessment:   This is a routine wellness examination for [name].  Hearing/Vision screen No results found.   Goals Addressed             This Visit's Progress    Weight (lb) < 200 lb (90.7 kg)   177 lb (80.3 kg)    She would like to lose 20  lbs and keep it off.       Depression Screen    04/16/2023    2:29 PM 08/24/2022    4:38 PM 01/30/2022    1:32 PM 11/25/2020    2:06 PM 04/21/2019    2:25 PM 11/19/2016    3:44 PM 04/05/2015    1:45 PM  PHQ 2/9 Scores  PHQ - 2 Score 0 2 0 0 0 0 0  PHQ- 9 Score  4       Exception Documentation     Medical reason      Fall Risk    04/16/2023    2:32 PM 05/29/2022    3:34 PM 01/30/2022    1:32 PM 08/03/2021    1:26 PM 11/25/2020    2:03 PM  Fall Risk   Falls in the past year? 1 0 0 0 0  Number falls in past yr: 1 0 0 0 0  Injury with Fall? 0 0 0 0 0  Risk for fall due to : No Fall Risks No Fall Risks No Fall Risks Impaired balance/gait No Fall Risks  Follow up Falls evaluation completed Falls evaluation completed Falls evaluation completed Falls prevention discussed;Falls evaluation completed Falls evaluation completed    MEDICARE RISK AT HOME: Medicare Risk at Home Any  stairs in or around the home?: Yes If so, are there any without handrails?: Yes Home free of loose throw rugs in walkways, pet beds, electrical cords, etc?: Yes Adequate lighting in your home to reduce risk of falls?: Yes Life alert?: No Use of a cane, walker or w/c?: No Grab bars in the bathroom?: No Shower chair or bench in shower?: No Elevated toilet seat or a handicapped toilet?: No  TIMED UP AND GO:  Was the test performed?  No    Cognitive Function:        04/16/2023    2:35 PM 01/30/2022    1:41 PM 11/25/2020    2:32 PM 04/21/2019    2:28 PM  6CIT Screen  What Year? 0 points 0 points 0 points 0 points  What month? 0 points 0 points 0 points 0 points  What time? 0 points 0 points 0 points 0 points  Count back from 20 0 points 0 points 0 points 0 points  Months in reverse 0 points 0 points 0 points 0 points  Repeat phrase 0 points 0 points 0 points 0 points  Total Score 0 points 0 points 0 points 0 points    Immunizations Immunization History  Administered Date(s) Administered   Fluad  Quad(high Dose 65+) 12/24/2018, 12/28/2019, 12/14/2020, 01/29/2022   Fluad Trivalent(High Dose 65+) 12/20/2022   Influenza Split 11/07/2011, 01/12/2013   Influenza Whole 02/12/2005, 12/02/2007, 11/24/2008   Influenza, High Dose Seasonal PF 11/19/2016, 12/10/2017   Influenza,inj,Quad PF,6+ Mos 01/11/2015   Influenza-Unspecified 11/07/2011, 01/12/2013, 11/12/2013, 01/11/2015, 12/24/2018   PFIZER(Purple Top)SARS-COV-2 Vaccination 03/20/2019, 04/15/2019   Pneumococcal Conjugate-13 04/26/2014   Pneumococcal Polysaccharide-23 02/12/2001, 11/27/2001, 11/28/2006, 11/07/2011   Td 02/12/1998, 06/13/1998   Tdap 09/23/2012    TDAP status: Due, Education has been provided regarding the importance of this vaccine. Advised may receive this vaccine at local pharmacy or Health Dept. Aware to provide a copy of the vaccination record if obtained from local pharmacy or Health Dept. Verbalized acceptance and understanding.  Flu Vaccine status: Up to date  Pneumococcal vaccine status: Up to date  Covid-19 vaccine status: Completed vaccines  Qualifies for Shingles Vaccine? Yes   Zostavax completed No   Shingrix Completed?: No.    Education has been provided regarding the importance of this vaccine. Patient has been advised to call insurance company to determine out of pocket expense if they have not yet received this vaccine. Advised may also receive vaccine at local pharmacy or Health Dept. Verbalized acceptance and understanding.  Screening Tests Health Maintenance  Topic Date Due   Zoster Vaccines- Shingrix (1 of 2) Never done   COVID-19 Vaccine (3 - Pfizer risk series) 05/13/2019   DTaP/Tdap/Td (4 - Td or Tdap) 09/24/2022   Medicare Annual Wellness (AWV)  04/15/2024   Colonoscopy  08/05/2024   Pneumonia Vaccine 45+ Years old  Completed   INFLUENZA VACCINE  Completed   DEXA SCAN  Completed   Hepatitis C Screening  Completed   HPV VACCINES  Aged Out    Health Maintenance  Health Maintenance Due   Topic Date Due   Zoster Vaccines- Shingrix (1 of 2) Never done   COVID-19 Vaccine (3 - Pfizer risk series) 05/13/2019   DTaP/Tdap/Td (4 - Td or Tdap) 09/24/2022    Colorectal cancer screening: Type of screening: Colonoscopy. Completed 08/06/2022. Repeat every 5 years  Mammogram status: Completed 05/02/22. Repeat every year  Bone Density status: Ordered 04/16/2023. Pt provided with contact info and advised to call to schedule  appt.  Lung Cancer Screening: (Low Dose CT Chest recommended if Age 7-80 years, 20 pack-year currently smoking OR have quit w/in 15years.) does not qualify.   Lung Cancer Screening Referral: n/a  Additional Screening:  Hepatitis C Screening: does not qualify; Completed 10/02/2010  Vision Screening: Recommended annual ophthalmology exams for early detection of glaucoma and other disorders of the eye. Is the patient up to date with their annual eye exam?  Yes  Who is the provider or what is the name of the office in which the patient attends annual eye exams? Dr Rodman Pickle  If pt is not established with a provider, would they like to be referred to a provider to establish care?  N/a .   Dental Screening: Recommended annual dental exams for proper oral hygiene    Community Resource Referral / Chronic Care Management: CRR required this visit?  No   CCM required this visit?  No     Plan:     I have personally reviewed and noted the following in the patient's chart:   Medical and social history Use of alcohol, tobacco or illicit drugs  Current medications and supplements including opioid prescriptions. Patient is not currently taking opioid prescriptions. Functional ability and status Nutritional status Physical activity Advanced directives List of other physicians Hospitalizations, surgeries, and ER visits in previous 12 months Vitals Screenings to include cognitive, depression, and falls Referrals and appointments  In addition, I have  reviewed and discussed with patient certain preventive protocols, quality metrics, and best practice recommendations. A written personalized care plan for preventive services as well as general preventive health recommendations were provided to patient.     Esmond Harps, CMA   04/16/2023   After Visit Summary: (Mail) Due to this being a telephonic visit, the after visit summary with patients personalized plan was offered to patient via mail   Nurse Notes:   [name] is a 78 y.o. female patient of Metheney, Barbarann Ehlers, MD who had a Medicare Annual Wellness Visit today via telephone. [name] is Retired and lives with their spouse. She has 3 children. she reports that she is socially active and does interact with friends/family regularly. She is minimally physically active and enjoys crocheting, spending time with grandchildren and reading.   Bone Density ordered today.  Recommended T-dap at local pharmacy.

## 2023-04-19 DIAGNOSIS — M25512 Pain in left shoulder: Secondary | ICD-10-CM | POA: Diagnosis not present

## 2023-04-23 DIAGNOSIS — M25512 Pain in left shoulder: Secondary | ICD-10-CM | POA: Diagnosis not present

## 2023-04-26 DIAGNOSIS — M25512 Pain in left shoulder: Secondary | ICD-10-CM | POA: Diagnosis not present

## 2023-04-30 DIAGNOSIS — M25512 Pain in left shoulder: Secondary | ICD-10-CM | POA: Diagnosis not present

## 2023-05-02 ENCOUNTER — Encounter: Payer: Self-pay | Admitting: Adult Health

## 2023-05-02 ENCOUNTER — Ambulatory Visit: Payer: Medicare Other | Admitting: Adult Health

## 2023-05-02 VITALS — BP 110/80 | HR 86 | Ht 63.0 in | Wt 183.4 lb

## 2023-05-02 DIAGNOSIS — J45909 Unspecified asthma, uncomplicated: Secondary | ICD-10-CM

## 2023-05-02 DIAGNOSIS — J4530 Mild persistent asthma, uncomplicated: Secondary | ICD-10-CM

## 2023-05-02 DIAGNOSIS — J309 Allergic rhinitis, unspecified: Secondary | ICD-10-CM

## 2023-05-02 MED ORDER — BUDESONIDE-FORMOTEROL FUMARATE 80-4.5 MCG/ACT IN AERO: 2.0000 | INHALATION_SPRAY | Freq: Two times a day (BID) | RESPIRATORY_TRACT | 5 refills | Status: AC

## 2023-05-02 NOTE — Patient Instructions (Addendum)
 Symbicort 2 puffs daily, rinse after use. May use with spacer.  Albuterol inhaler As needed   Saline nasal rinses As needed   Continue on Allegra daily  May use Flonase daily As needed   Saline nasal As needed   Mucinex As needed   Delsym 2 tsp Twice daily  As needed  Cough .  Tessalon Three times a day  for cough As needed   Follow up Dr. Vassie Loll  or Tikesha Mort NP 6 months and As needed   Please contact office for sooner follow up if symptoms do not improve or worsen or seek emergency care

## 2023-05-02 NOTE — Progress Notes (Signed)
 @Patient  ID: [name], female    DOB: [birth year], 78 y.o.   MRN: [id number]  Chief Complaint  Patient presents with   Follow-up   Discussed the use of AI scribe software for clinical note transcription with the patient, who gave verbal consent to proceed.  Referring provider: Agapito Games, *  HPI: 78 year old female with minimum smoking history followed for asthma, allergic rhinitis and chronic cough Medical history significant for rheumatoid arthritis, osteoarthritis previously on methotrexate.  Iron deficiency anemia  TEST/EVENTS :  Spirometry 06/2013 ratio 63 FEV1 normal   05/02/2023  Dellamae Rosamilia is a 78 year old female with asthma who presents for a six-month follow-up.  She continues to use her inhaler, Symbicort, 2 puffs daily.  She does not frequently use her albuterol inhaler, patient had PFTs done April 11, 2023 that showed normal lung function with no airflow obstruction or restriction.  FEV1 was at 98%, ratio 82, FVC 89%, no significant bronchodilator response, DLCO 87%.  She is currently taking Allegra every night for allergies and has Flonase at home, which she uses occasionally when symptoms worsen. She also uses Simply Saline and takes Mucinex 1200 mg twice a day to manage nasal symptoms and prevent her nose from running excessively. Her sense of smell is diminished unless the scent is very strong.  She is experiencing dry eyes, She was previously using eye drops for dry eyes but has stopped due to contact lens use. She attributes the dryness to her medications.    She is going on a cruise next month with her family.  Allergies  Allergen Reactions   Moxifloxacin Other (See Comments)    confusion, dizziness, paranoia   Other     Black pepper -stomach pain   Astelin [Azelastine Hcl] Other (See Comments)    Headache   Celebrex [Celecoxib] Other (See Comments)    Stomach issues   Lisinopril Cough   Methotrexate Derivatives Other (See  Comments)    Flu like symptoms    Morphine Nausea And Vomiting    Immunization History  Administered Date(s) Administered   Fluad Quad(high Dose 65+) 12/24/2018, 12/28/2019, 12/14/2020, 01/29/2022   Fluad Trivalent(High Dose 65+) 12/20/2022   Influenza Split 11/07/2011, 01/12/2013   Influenza Whole 02/12/2005, 12/02/2007, 11/24/2008   Influenza, High Dose Seasonal PF 11/19/2016, 12/10/2017   Influenza,inj,Quad PF,6+ Mos 01/11/2015   Influenza-Unspecified 11/07/2011, 01/12/2013, 11/12/2013, 01/11/2015, 12/24/2018   PFIZER(Purple Top)SARS-COV-2 Vaccination 03/20/2019, 04/15/2019   Pneumococcal Conjugate-13 04/26/2014   Pneumococcal Polysaccharide-23 02/12/2001, 11/27/2001, 11/28/2006, 11/07/2011   Td 02/12/1998, 06/13/1998   Tdap 09/23/2012    Past Medical History:  Diagnosis Date   Allergic rhinitis, cause unspecified    Anemia    iron   Anxiety    Asthma    Benign paroxysmal positional vertigo    Cancer (HCC)    basal cell carcinoma per right side of nostril   CHF (congestive heart failure) (HCC) 11/09/2016   had 2+ edema in legs with anxiety and SOB   Complication of anesthesia    has 4  degenerative discs in neck.   Depressive disorder, not elsewhere classified    Esophageal reflux    History of hiatal hernia    Hypertension    Localized osteoarthrosis not specified whether primary or secondary, lower leg    Other and unspecified hyperlipidemia    Other diseases of vocal cords    Peptic ulcer, unspecified site, unspecified as acute or chronic, without mention of hemorrhage, perforation, or obstruction    Pneumonia  bilat pneumonia 1987   Pre-diabetes    no meds,just diet controll   TMJ (temporomandibular joint disorder)    Unspecified arthropathy, hand    Unspecified asthma(493.90)    triggered with a virus    Unspecified hypothyroidism    Venous stasis ulcer of left lower extremity (HCC) 03/03/2015   By 1/17 biopsy      Tobacco History: Social History    Tobacco Use  Smoking Status Former   Current packs/day: 0.00   Average packs/day: 0.3 packs/day for 2.0 years (0.6 ttl pk-yrs)   Types: Cigarettes   Start date: 02/13/1956   Quit date: 02/12/1958   Years since quitting: 65.2  Smokeless Tobacco Never   Counseling given: Not Answered   Outpatient Medications Prior to Visit  Medication Sig Dispense Refill   albuterol (VENTOLIN HFA) 108 (90 Base) MCG/ACT inhaler INHALE 2 PUFFS INTO THE LUNGS EVERY 6 HOURS AS NEEDED FOR SHORTNESS OF BREATH 8.5 each 5   Ascorbic Acid (VITAMIN C WITH ROSE HIPS) 1000 MG tablet Take 1,000 mg by mouth daily.     buPROPion (WELLBUTRIN XL) 300 MG 24 hr tablet Take 300 mg by mouth daily.     Calcium Carb-Cholecalciferol (CALCIUM 600+D3 PO) Take 1 tablet by mouth 2 (two) times daily.     Cholecalciferol (VITAMIN D3) 50 MCG (2000 UT) capsule Take 2,000 Units by mouth daily.     clobetasol ointment (TEMOVATE) 0.05 % Apply 1 Application topically 2 (two) times daily as needed. 30 g 1   famotidine (PEPCID) 20 MG tablet Take 20 mg by mouth at bedtime.      fexofenadine (ALLEGRA) 180 MG tablet Take 180 mg by mouth at bedtime.     folic acid (FOLVITE) 1 MG tablet TAKE 2 TABLETS BY MOUTH EVERY DAY (Patient taking differently: Take 1 mg by mouth daily.) 180 tablet 3   Ginkgo Biloba (GNP GINGKO BILOBA EXTRACT PO) Take 1 tablet by mouth 3 (three) times daily with meals.     hydrochlorothiazide (HYDRODIURIL) 25 MG tablet TAKE 1 TABLET BY MOUTH EVERY DAY 90 tablet 3   lidocaine (LIDODERM) 5 % Place 1 patch onto the skin daily. Remove & Discard patch within 12 hours or as directed by MD (Patient taking differently: Place 1 patch onto the skin daily as needed (pain). Remove & Discard patch within 12 hours or as directed by MD) 90 patch 1   losartan (COZAAR) 50 MG tablet TAKE 1 TABLET BY MOUTH EVERY DAY 90 tablet 3   Magnesium 100 MG TABS Take 100 mg by mouth 2 (two) times a day.     meloxicam (MOBIC) 15 MG tablet TAKE 1 TABLET BY  MOUTH EVERY DAY IN THE MORNING 90 tablet 0   metoprolol succinate (TOPROL-XL) 25 MG 24 hr tablet Take 1 tablet by mouth daily.     OVER THE COUNTER MEDICATION Apply 1 Application topically 2 (two) times daily. CBD roll on pain reliever     PAXIL CR 25 MG 24 hr tablet Take 1 tablet (25 mg total) by mouth daily. (Patient taking differently: Take 12.5 mg by mouth daily.) 90 tablet 3   polyethylene glycol (MIRALAX / GLYCOLAX) 17 g packet Take 17 g by mouth daily as needed for mild constipation. (Patient taking differently: Take 8.5 g by mouth daily as needed for mild constipation.) 14 each 0   Propylene Glycol, PF, (SYSTANE COMPLETE PF) 0.6 % SOLN Place 1 drop into both eyes 2 (two) times daily.     SYNTHROID 75  MCG tablet TAKE 1 TABLET BY MOUTH DAILY BEFORE BREAKFAST. 90 tablet 1   TART CHERRY PO Take 2 capsules by mouth daily.     budesonide-formoterol (SYMBICORT) 80-4.5 MCG/ACT inhaler Inhale 2 puffs into the lungs in the morning and at bedtime. (Patient taking differently: Inhale 2 puffs into the lungs in the morning and at bedtime. Using daily.) 30.6 each 3   No facility-administered medications prior to visit.     Review of Systems:   Constitutional:   No  weight loss, night sweats,  Fevers, chills, fatigue, or  lassitude.  HEENT:   No headaches,  Difficulty swallowing,  Tooth/dental problems, or  Sore throat,                No sneezing, itching, ear ache, +nasal congestion, post nasal drip,   CV:  No chest pain,  Orthopnea, PND, swelling in lower extremities, anasarca, dizziness, palpitations, syncope.   GI  No heartburn, indigestion, abdominal pain, nausea, vomiting, diarrhea, change in bowel habits, loss of appetite, bloody stools.   Resp: No shortness of breath with exertion or at rest.  No excess mucus, no productive cough,  No non-productive cough,  No coughing up of blood.  No change in color of mucus.  No wheezing.  No chest wall deformity  Skin: no rash or lesions.  GU: no  dysuria, change in color of urine, no urgency or frequency.  No flank pain, no hematuria   MS:  No joint pain or swelling.  No decreased range of motion.  No back pain.    Physical Exam  BP 110/80 (BP Location: Left Arm, Patient Position: Sitting, Cuff Size: Normal)   Pulse 86   Ht 5\' 3"  (1.6 m)   Wt 183 lb 6.4 oz (83.2 kg)   SpO2 99%   BMI 32.49 kg/m   GEN: A/Ox3; pleasant , NAD, well nourished    HEENT:  [place]/AT,  NOSE-clear, THROAT-clear, no lesions, no postnasal drip or exudate noted.   NECK:  Supple w/ fair ROM; no JVD; normal carotid impulses w/o bruits; no thyromegaly or nodules palpated; no lymphadenopathy.    RESP  Clear  P & A; w/o, wheezes/ rales/ or rhonchi. no accessory muscle use, no dullness to percussion  CARD:  RRR, no m/r/g, no peripheral edema, pulses intact, no cyanosis or clubbing.  GI:   Soft & nt; nml bowel sounds; no organomegaly or masses detected.   Musco: Warm bil, no deformities or joint swelling noted.   Neuro: alert, no focal deficits noted.    Skin: Warm, no lesions or rashes    Lab Results:     BNP    Imaging: No results found.  Administration History     None          Latest Ref Rng & Units 04/11/2023    2:42 PM  PFT Results  FVC-Pre L 2.36   FVC-Predicted Pre % 90   FVC-Post L 2.33   FVC-Predicted Post % 89   Pre FEV1/FVC % % 78   Post FEV1/FCV % % 82   FEV1-Pre L 1.83   FEV1-Predicted Pre % 95   FEV1-Post L 1.90   DLCO uncorrected ml/min/mmHg 16.16   DLCO UNC% % 87   DLCO corrected ml/min/mmHg 16.16   DLCO COR %Predicted % 87   DLVA Predicted % 103   TLC L 4.29   TLC % Predicted % 86   RV % Predicted % 73     No results found for: "  NITRICOXIDE"      Assessment & Plan:  Assessment and Plan    Asthma   Her asthma is well-controlled with the current medication regimen. Recent pulmonary function tests show good lung function. She uses a Symbicort inhaler daily and rarely needs albuterol, indicating  effective management. A spacer is recommended with the inhaler to enhance medication delivery and reduce throat irritation. Continue Symbicort  use a spacer, and refill the prescription. Monitor albuterol use and refill as needed.  Allergic Rhinitis   She experiences symptoms of allergic rhinitis, especially during allergy season. She takes Allegra nightly and uses Flonase as needed. She reports a reduced sense of smell and occasional throat clearing due to drainage. Simply Saline is recommended to manage symptoms and prevent antihistamine overuse, which can cause dryness. Continue Allegra nightly, use Flonase for increased nasal symptoms, and use Simply Saline regularly to maintain nasal moisture and reduce drainage.   Plan  Patient Instructions  Symbicort 2 puffs daily, rinse after use. May use with spacer.  Albuterol inhaler As needed   Saline nasal rinses As needed   Continue on Allegra daily  May use Flonase daily As needed   Saline nasal As needed   Mucinex As needed   Delsym 2 tsp Twice daily  As needed  Cough .  Tessalon Three times a day  for cough As needed   Follow up Dr. Vassie Loll  or Manual Navarra NP 6 months and As needed   Please contact office for sooner follow up if symptoms do not improve or worsen or seek emergency care    o     Rubye Oaks, NP 05/02/2023

## 2023-05-13 DIAGNOSIS — M25512 Pain in left shoulder: Secondary | ICD-10-CM | POA: Diagnosis not present

## 2023-05-14 DIAGNOSIS — F410 Panic disorder [episodic paroxysmal anxiety] without agoraphobia: Secondary | ICD-10-CM | POA: Diagnosis not present

## 2023-05-14 DIAGNOSIS — F422 Mixed obsessional thoughts and acts: Secondary | ICD-10-CM | POA: Diagnosis not present

## 2023-05-14 DIAGNOSIS — F902 Attention-deficit hyperactivity disorder, combined type: Secondary | ICD-10-CM | POA: Diagnosis not present

## 2023-06-08 ENCOUNTER — Other Ambulatory Visit: Payer: Self-pay | Admitting: Family Medicine

## 2023-06-10 DIAGNOSIS — Z950 Presence of cardiac pacemaker: Secondary | ICD-10-CM | POA: Diagnosis not present

## 2023-06-10 DIAGNOSIS — I495 Sick sinus syndrome: Secondary | ICD-10-CM | POA: Diagnosis not present

## 2023-06-12 ENCOUNTER — Encounter: Payer: Self-pay | Admitting: Medical Oncology

## 2023-06-12 ENCOUNTER — Inpatient Hospital Stay (HOSPITAL_BASED_OUTPATIENT_CLINIC_OR_DEPARTMENT_OTHER): Payer: Medicare Other | Admitting: Medical Oncology

## 2023-06-12 ENCOUNTER — Inpatient Hospital Stay: Payer: Medicare Other | Attending: Hematology & Oncology

## 2023-06-12 VITALS — BP 109/59 | HR 74 | Temp 98.0°F | Resp 18 | Ht 63.0 in | Wt 184.0 lb

## 2023-06-12 DIAGNOSIS — Z13820 Encounter for screening for osteoporosis: Secondary | ICD-10-CM

## 2023-06-12 DIAGNOSIS — D509 Iron deficiency anemia, unspecified: Secondary | ICD-10-CM | POA: Insufficient documentation

## 2023-06-12 DIAGNOSIS — Z79899 Other long term (current) drug therapy: Secondary | ICD-10-CM | POA: Insufficient documentation

## 2023-06-12 DIAGNOSIS — R5382 Chronic fatigue, unspecified: Secondary | ICD-10-CM | POA: Insufficient documentation

## 2023-06-12 DIAGNOSIS — E611 Iron deficiency: Secondary | ICD-10-CM

## 2023-06-12 DIAGNOSIS — Z96642 Presence of left artificial hip joint: Secondary | ICD-10-CM | POA: Insufficient documentation

## 2023-06-12 DIAGNOSIS — D649 Anemia, unspecified: Secondary | ICD-10-CM | POA: Diagnosis not present

## 2023-06-12 DIAGNOSIS — Z780 Asymptomatic menopausal state: Secondary | ICD-10-CM

## 2023-06-12 LAB — CBC
HCT: 36.6 % (ref 36.0–46.0)
Hemoglobin: 12 g/dL (ref 12.0–15.0)
MCH: 29.3 pg (ref 26.0–34.0)
MCHC: 32.8 g/dL (ref 30.0–36.0)
MCV: 89.5 fL (ref 80.0–100.0)
Platelets: 267 10*3/uL (ref 150–400)
RBC: 4.09 MIL/uL (ref 3.87–5.11)
RDW: 13.7 % (ref 11.5–15.5)
WBC: 6.8 10*3/uL (ref 4.0–10.5)
nRBC: 0 % (ref 0.0–0.2)

## 2023-06-12 LAB — FERRITIN: Ferritin: 160 ng/mL (ref 11–307)

## 2023-06-12 NOTE — Progress Notes (Signed)
 Hematology and Oncology Follow Up Visit  [name] [id number] 03-22-1945 78 y.o. 06/12/2023   Principle Diagnosis:  Iron deficiency anemia    Current Therapy:        IV iron as indicated- Monoferric - 12/14/2021  -Oral iron intolerance   Previous Work Up: Colonoscopy: 08/06/2022   Interim History:  [name] is here today for follow-up for her IDA.   Today she states that she is just getting back from a wonderful vacation with family. They went on a cruise.  She is feeling well.  Chronic fatigue is still present Does not tolerate oral iron.  SOB and dizziness have resolved No fever, chills, n/v, cough, rash, chest pain, palpitations, abdominal pain or changes in bowel or bladder habits.  No swelling, tenderness, numbness or tingling in her extremities at this time.  No falls or syncope.  Appetite and hydration are improving Wt Readings from Last 3 Encounters:  06/12/23 184 lb (83.5 kg)  05/02/23 183 lb 6.4 oz (83.2 kg)  04/16/23 177 lb (80.3 kg)   ECOG Performance Status: 1 - Symptomatic but completely ambulatory  Medications:  Allergies as of 06/12/2023       Reactions   Moxifloxacin Other (See Comments)   confusion, dizziness, paranoia   Other    Black pepper -stomach pain   Astelin [azelastine Hcl] Other (See Comments)   Headache   Celebrex  [celecoxib ] Other (See Comments)   Stomach issues   Lisinopril  Cough   Methotrexate Derivatives Other (See Comments)   Flu like symptoms    Morphine Nausea And Vomiting        Medication List        Accurate as of June 12, 2023  2:27 PM. If you have any questions, ask your nurse or doctor.          albuterol  108 (90 Base) MCG/ACT inhaler Commonly known as: VENTOLIN  HFA INHALE 2 PUFFS INTO THE LUNGS EVERY 6 HOURS AS NEEDED FOR SHORTNESS OF BREATH   budesonide -formoterol  80-4.5 MCG/ACT inhaler Commonly known as: Symbicort  Inhale 2 puffs into the lungs in the morning and at bedtime. Using daily.    buPROPion  300 MG 24 hr tablet Commonly known as: WELLBUTRIN  XL Take 300 mg by mouth daily.   CALCIUM  600+D3 PO Take 1 tablet by mouth 2 (two) times daily.   clobetasol  ointment 0.05 % Commonly known as: TEMOVATE  Apply 1 Application topically 2 (two) times daily as needed.   famotidine  20 MG tablet Commonly known as: PEPCID  Take 20 mg by mouth at bedtime.   fexofenadine  180 MG tablet Commonly known as: ALLEGRA  Take 180 mg by mouth at bedtime.   folic acid  1 MG tablet Commonly known as: FOLVITE  TAKE 2 TABLETS BY MOUTH EVERY DAY What changed: how much to take   GNP GINGKO BILOBA EXTRACT PO Take 1 tablet by mouth 3 (three) times daily with meals.   hydrochlorothiazide  25 MG tablet Commonly known as: HYDRODIURIL  TAKE 1 TABLET BY MOUTH EVERY DAY   lidocaine  5 % Commonly known as: Lidoderm  Place 1 patch onto the skin daily. Remove & Discard patch within 12 hours or as directed by MD What changed:  when to take this reasons to take this   losartan  50 MG tablet Commonly known as: COZAAR  TAKE 1 TABLET BY MOUTH EVERY DAY   Magnesium  100 MG Tabs Take 100 mg by mouth 2 (two) times a day.   meloxicam  15 MG tablet Commonly known as: MOBIC  TAKE 1 TABLET BY MOUTH EVERY DAY IN  THE MORNING   metoprolol  succinate 25 MG 24 hr tablet Commonly known as: TOPROL -XL Take 1 tablet by mouth daily.   OVER THE COUNTER MEDICATION Apply 1 Application topically 2 (two) times daily. CBD roll on pain reliever   Paxil  CR 25 MG 24 hr tablet Generic drug: PARoxetine  Take 1 tablet (25 mg total) by mouth daily. What changed: how much to take   polyethylene glycol 17 g packet Commonly known as: MIRALAX  / GLYCOLAX  Take 17 g by mouth daily as needed for mild constipation. What changed: how much to take   Synthroid  75 MCG tablet Generic drug: levothyroxine  TAKE 1 TABLET BY MOUTH DAILY BEFORE BREAKFAST.   Systane Complete PF 0.6 % Soln Generic drug: Propylene Glycol (PF) Place 1 drop  into both eyes 2 (two) times daily.   TART CHERRY PO Take 2 capsules by mouth daily.   vitamin C with rose hips 1000 MG tablet Take 1,000 mg by mouth daily.   Vitamin D3 50 MCG (2000 UT) capsule Take 2,000 Units by mouth daily.        Allergies:  Allergies  Allergen Reactions   Moxifloxacin Other (See Comments)    confusion, dizziness, paranoia   Other     Black pepper -stomach pain   Astelin [Azelastine Hcl] Other (See Comments)    Headache   Celebrex  [Celecoxib ] Other (See Comments)    Stomach issues   Lisinopril  Cough   Methotrexate Derivatives Other (See Comments)    Flu like symptoms    Morphine Nausea And Vomiting    Past Medical History, Surgical history, Social history, and Family History were reviewed and updated.  Review of Systems: All other 10 point review of systems is negative.   Physical Exam:  height is 5\' 3"  (1.6 m) and weight is 184 lb (83.5 kg). Her oral temperature is 98 F (36.7 C). Her blood pressure is 109/59 (abnormal) and her pulse is 74. Her respiration is 18 and oxygen  saturation is 100%.   Wt Readings from Last 3 Encounters:  06/12/23 184 lb (83.5 kg)  05/02/23 183 lb 6.4 oz (83.2 kg)  04/16/23 177 lb (80.3 kg)    Ocular: Sclerae unicteric, pupils equal, round and reactive to light Ear-nose-throat: Oropharynx clear, dentition fair Lymphatic: No cervical or supraclavicular adenopathy Lungs no rales or rhonchi, good excursion bilaterally Heart regular rate and rhythm, no murmur appreciated MSK no focal spinal tenderness, no joint edema Neuro: non-focal, well-oriented, appropriate affect  Lab Results  Component Value Date   WBC 6.8 06/12/2023   HGB 12.0 06/12/2023   HCT 36.6 06/12/2023   MCV 89.5 06/12/2023   PLT 267 06/12/2023   Lab Results  Component Value Date   FERRITIN 108 12/12/2022   IRON 59 12/12/2022   TIBC 354 12/12/2022   UIBC 295 12/12/2022   IRONPCTSAT 17 12/12/2022   Lab Results  Component Value Date    RETICCTPCT 2.3 09/03/2022   RBC 4.09 06/12/2023   No results found for: "KPAFRELGTCHN", "LAMBDASER", "KAPLAMBRATIO" No results found for: "IGGSERUM", "IGA", "IGMSERUM" No results found for: "TOTALPROTELP", "ALBUMINELP", "A1GS", "A2GS", "BETS", "BETA2SER", "GAMS", "MSPIKE", "SPEI"   Chemistry      Component Value Date/Time   NA 144 12/20/2022 1141   NA 143 04/17/2012 1445   K 4.4 12/20/2022 1141   K 3.8 04/17/2012 1445   CL 105 12/20/2022 1141   CL 106 04/17/2012 1445   CO2 24 12/20/2022 1141   CO2 27 04/17/2012 1445   BUN 20 12/20/2022 1141  BUN 15.6 04/17/2012 1445   CREATININE 0.81 12/20/2022 1141   CREATININE 0.86 11/27/2021 1507   CREATININE 0.96 11/08/2021 0000   CREATININE 0.8 04/17/2012 1445   GLU 92 10/30/2019 0000      Component Value Date/Time   CALCIUM  9.7 12/20/2022 1141   CALCIUM  9.2 04/17/2012 1445   ALKPHOS 60 12/20/2022 1141   ALKPHOS 75 04/17/2012 1445   AST 12 12/20/2022 1141   AST 15 11/27/2021 1507   AST 18 04/17/2012 1445   ALT 14 12/20/2022 1141   ALT 20 11/27/2021 1507   ALT 40 04/17/2012 1445   BILITOT 0.4 12/20/2022 1141   BILITOT 0.4 11/27/2021 1507   BILITOT 0.31 04/17/2012 1445     Encounter Diagnoses  Name Primary?   Iron deficiency Yes   Anemia, unspecified type     Impression and Plan: Ms. Johnathan Myron is a very pleasant 78 yo caucasian female with history of iron deficiency anemia post total left hip replacement surgery.   CBC is great today with a Hgb of 12. MCV of 89.5 Iron studies are pending. Will replace if needed.   RTC 6 months APP, labs (Cbc, iron, ferritin)-[place]  Sharla Davis, PA-C 4/30/20252:27 PM

## 2023-06-13 ENCOUNTER — Ambulatory Visit

## 2023-06-13 ENCOUNTER — Ambulatory Visit: Payer: Self-pay

## 2023-06-13 ENCOUNTER — Encounter: Payer: Self-pay | Admitting: Family Medicine

## 2023-06-13 ENCOUNTER — Ambulatory Visit: Admitting: Family Medicine

## 2023-06-13 VITALS — BP 121/72 | HR 71 | Temp 98.7°F | Ht 63.0 in | Wt 186.0 lb

## 2023-06-13 DIAGNOSIS — M7989 Other specified soft tissue disorders: Secondary | ICD-10-CM | POA: Diagnosis not present

## 2023-06-13 DIAGNOSIS — M25572 Pain in left ankle and joints of left foot: Secondary | ICD-10-CM

## 2023-06-13 DIAGNOSIS — R600 Localized edema: Secondary | ICD-10-CM

## 2023-06-13 DIAGNOSIS — R609 Edema, unspecified: Secondary | ICD-10-CM | POA: Diagnosis not present

## 2023-06-13 DIAGNOSIS — M19072 Primary osteoarthritis, left ankle and foot: Secondary | ICD-10-CM | POA: Diagnosis not present

## 2023-06-13 LAB — IRON AND IRON BINDING CAPACITY (CC-WL,HP ONLY)
Iron: 55 ug/dL (ref 28–170)
Saturation Ratios: 17 % (ref 10.4–31.8)
TIBC: 328 ug/dL (ref 250–450)
UIBC: 273 ug/dL (ref 148–442)

## 2023-06-13 NOTE — Assessment & Plan Note (Signed)
 Left lower extremity edema > than right. Worse at ankle. No calf pain. No erythema. Elevate legs. Check BMP today. Follow-up with PCP if symptoms do not improve. Symptoms reviewed that warrant seeking higher level of care.

## 2023-06-13 NOTE — Assessment & Plan Note (Signed)
 Left lateral ankle with tenderness when rotating. Swelling > than right. No known injury, however, she did fall out of bed while on the cruise. Right side of her head is sore. Will send for x-ray on left ankle to rule out injury. Declines wrapping with ace today. Recommend elevating both legs, continuing to watch sodium intake and drink adequate water.  She is on hydrochlorothiazide  25 mg per PCP.

## 2023-06-13 NOTE — Telephone Encounter (Signed)
 Patient has upcoming appt today with Buddie Carina, NP

## 2023-06-13 NOTE — Telephone Encounter (Signed)
 This RN attempted return call to patient. No answer. LVM. (Left leg swelling)

## 2023-06-13 NOTE — Progress Notes (Signed)
 Acute Office Visit  Subjective:     Patient ID: [name], female    DOB: August 30, 1945, 78 y.o.   MRN: [id number]  Chief Complaint  Patient presents with   Leg Swelling    Bilateral leg edema, tight and shiny skin, pt has been on feet more as late     HPI Patient is in today for edema in both legs, returned from cruise on Monday.  Swelling started while on the cruise. Wonders if this is related to the sodium she ate on the cruise. More shortness of breath while walking on the cruise. No obvious shortness of breath while talking to provider.  Has been walking and on her feet more since being home looking for her granddaughter a ruby.  Was very busy while on the cruise. Marvell Slider out of the bed on the cruise. Does not think she hurt her ankle when she fell out of the bed,right side of head is sore. Taking hydrochlorothiazide  25 mg daily as prescribed.  No calf pain. No redness in calves. Left lateral ankle with more swelling. There is pain when rotating the left ankle.  No pain with walking.  Has not had time to elevate legs. Has been limiting sodium since being back home.   Chart review: 4/30 Labs:  Iron: normal Ferritin: normal CBC: normal      Review of Systems  Respiratory:  Positive for shortness of breath (with exertion).   Cardiovascular:  Positive for leg swelling.  Musculoskeletal:  Positive for falls (while on the cruise).        Objective:    BP 121/72   Pulse 71   Temp 98.7 F (37.1 C) (Oral)   Ht 5\' 3"  (1.6 m)   Wt 186 lb (84.4 kg)   SpO2 99%   BMI 32.95 kg/m  BP Readings from Last 3 Encounters:  06/13/23 121/72  06/12/23 (!) 109/59  05/02/23 110/80      Physical Exam Cardiovascular:     Rate and Rhythm: Regular rhythm.  Pulmonary:     Effort: Pulmonary effort is normal.     Breath sounds: Normal breath sounds.  Musculoskeletal:        General: Swelling and tenderness (with rotating left ankle) present.     Right lower leg: 1+  Edema present.     Left lower leg: Tenderness present. No bony tenderness. 1+ Edema present.     Comments: Tenderness on lateral left ankle with ROM. No pain with walking. Swelling is more pronounced on left ankle.   Skin:    General: Skin is warm and dry.  Neurological:     General: No focal deficit present.     Mental Status: Mental status is at baseline.  Psychiatric:        Mood and Affect: Mood normal.        Behavior: Behavior normal.        Thought Content: Thought content normal.        Judgment: Judgment normal.     No results found for any visits on 06/13/23.      Assessment & Plan:   Problem List Items Addressed This Visit     Acute left ankle pain - Primary   Left lateral ankle with tenderness when rotating. Swelling > than right. No known injury, however, she did fall out of bed while on the cruise. Right side of her head is sore. Will send for x-ray on left ankle to rule out injury. Declines wrapping with ace  today. Recommend elevating both legs, continuing to watch sodium intake and drink adequate water.  She is on hydrochlorothiazide  25 mg per PCP.       Relevant Orders   DG Ankle Complete Left   Bilateral edema of lower extremity   Left lower extremity edema > than right. Worse at ankle. No calf pain. No erythema. Elevate legs. Check BMP today. Follow-up with PCP if symptoms do not improve. Symptoms reviewed that warrant seeking higher level of care.      Relevant Orders   Basic metabolic panel with GFR  Agrees with plan of care discussed.  Questions answered.      Return if symptoms worsen or fail to improve.  Mickiel Albany, FNP

## 2023-06-13 NOTE — Telephone Encounter (Signed)
  Chief Complaint: bilateral leg swelling left greater than right , SOB with exertion redness left ankle Symptoms: see above, patient returned from 9 day cruise and reports swelling started while on cruise. Now left ankle redness noted. No pain unless pressing on lower legs. Indention noted from shoes around ankle.  Frequency: approx. 9 days or noted while on cruise Pertinent Negatives: Patient denies chest pain no difficulty breathing  Disposition: [] ED /[] Urgent Care (no appt availability in office) / [x] Appointment(In office/virtual)/ []  [place] [facility]/ [] Home Care/ [] Refused Recommended Disposition /[] [place] [facility]/ []  Follow-up with PCP Additional Notes:   No available appt until tomorrow. CAL notified and appt was able to be scheduled at [facility] today .      Reason for Disposition  [1] Red area or streak [2] large (> 2 in. or 5 cm)  Answer Assessment - Initial Assessment Questions 1. ONSET: "When did the swelling start?" (e.g., minutes, hours, days)     While on cruise for 9 days  2. LOCATION: "What part of the leg is swollen?"  "Are both legs swollen or just one leg?"     Up to knees  3. SEVERITY: "How bad is the swelling?" (e.g., localized; mild, moderate, severe)   - Localized: Small area of swelling localized to one leg.   - MILD pedal edema: Swelling limited to foot and ankle, pitting edema < 1/4 inch (6 mm) deep, rest and elevation eliminate most or all swelling.   - MODERATE edema: Swelling of lower leg to knee, pitting edema > 1/4 inch (6 mm) deep, rest and elevation only partially reduce swelling.   - SEVERE edema: Swelling extends above knee, facial or hand swelling present.      Hurts when pressing on legs below shin  4. REDNESS: "Does the swelling look red or infected?"     Minimal redness around ankle  5. PAIN: "Is the swelling painful to touch?" If Yes, ask: "How painful is it?"   (Scale 1-10; mild, moderate or severe)     No pain 6. FEVER:  "Do you have a fever?" If Yes, ask: "What is it, how was it measured, and when did it start?"      na 7. CAUSE: "What do you think is causing the leg swelling?"     Eating different foods since on cruise 8. MEDICAL HISTORY: "Do you have a history of blood clots (e.g., DVT), cancer, heart failure, kidney disease, or liver failure?"     See hx  9. RECURRENT SYMPTOM: "Have you had leg swelling before?" If Yes, ask: "When was the last time?" "What happened that time?"     No  10. OTHER SYMPTOMS: "Do you have any other symptoms?" (e.g., chest pain, difficulty breathing)       Leg swelling , always SOB  11. PREGNANCY: "Is there any chance you are pregnant?" "When was your last menstrual period?"       na  Protocols used: Leg Swelling and Edema-A-AH

## 2023-06-13 NOTE — Patient Instructions (Signed)
 Elevate your feet and you can.  Follow-up with PCP if symptoms do not improve.   If you develop pain in the calf, go to the ED for evaluation.

## 2023-06-14 ENCOUNTER — Encounter: Payer: Self-pay | Admitting: Family Medicine

## 2023-06-14 LAB — BASIC METABOLIC PANEL WITH GFR
BUN/Creatinine Ratio: 25 (ref 12–28)
BUN: 20 mg/dL (ref 8–27)
CO2: 24 mmol/L (ref 20–29)
Calcium: 9.4 mg/dL (ref 8.7–10.3)
Chloride: 103 mmol/L (ref 96–106)
Creatinine, Ser: 0.8 mg/dL (ref 0.57–1.00)
Glucose: 85 mg/dL (ref 70–99)
Potassium: 4.6 mmol/L (ref 3.5–5.2)
Sodium: 140 mmol/L (ref 134–144)
eGFR: 75 mL/min/{1.73_m2} (ref >59)

## 2023-06-17 ENCOUNTER — Ambulatory Visit: Payer: Self-pay

## 2023-06-17 ENCOUNTER — Encounter: Payer: Self-pay | Admitting: Family Medicine

## 2023-06-17 NOTE — Telephone Encounter (Signed)
 Chief Complaint: x-ray results   Disposition: [] ED /[] Urgent Care (no appt availability in office) / [] Appointment(In office/virtual)/ []  [place] [facility]/ [] Home Care/ [] Refused Recommended Disposition /[] [place] [facility]/ [x]  Follow-up with PCP Additional Notes: pt states ankle is still swollen but has gone done some but still redden. States that she is wanted to the results of her x-ray.    Copied From CRM [id number]. Reason for Triage: pt called left ankle still swollen and red (has seen dr about it before) , getting in shower now.  ask can you call her back in 45 minutes     Reason for Disposition  [1] Caller requesting NON-URGENT health information AND [2] PCP's office is the best resource  Answer Assessment - Initial Assessment Questions 1. REASON FOR CALL or QUESTION: "What is your reason for calling today?" or "How can I best help you?" or "What question do you have that I can help answer?"     Wanting results of her x ray  Protocols used: Information Only Call - No Triage-A-AH

## 2023-06-17 NOTE — Telephone Encounter (Signed)
 Unreviewed Xray results now available in the patient's chart.

## 2023-06-18 DIAGNOSIS — F902 Attention-deficit hyperactivity disorder, combined type: Secondary | ICD-10-CM | POA: Diagnosis not present

## 2023-06-18 DIAGNOSIS — F331 Major depressive disorder, recurrent, moderate: Secondary | ICD-10-CM | POA: Diagnosis not present

## 2023-06-18 DIAGNOSIS — F410 Panic disorder [episodic paroxysmal anxiety] without agoraphobia: Secondary | ICD-10-CM | POA: Diagnosis not present

## 2023-06-18 DIAGNOSIS — F422 Mixed obsessional thoughts and acts: Secondary | ICD-10-CM | POA: Diagnosis not present

## 2023-06-28 ENCOUNTER — Other Ambulatory Visit: Payer: Self-pay | Admitting: Family Medicine

## 2023-07-06 ENCOUNTER — Other Ambulatory Visit: Payer: Self-pay | Admitting: Family Medicine

## 2023-07-06 ENCOUNTER — Other Ambulatory Visit (HOSPITAL_BASED_OUTPATIENT_CLINIC_OR_DEPARTMENT_OTHER): Payer: Self-pay | Admitting: Pulmonary Disease

## 2023-07-06 DIAGNOSIS — M47812 Spondylosis without myelopathy or radiculopathy, cervical region: Secondary | ICD-10-CM

## 2023-07-10 NOTE — Telephone Encounter (Signed)
 Last refill was 06/05/22. Is the refill appropriate? Please advise, thanks.

## 2023-07-11 ENCOUNTER — Telehealth: Payer: Self-pay

## 2023-07-11 ENCOUNTER — Other Ambulatory Visit (HOSPITAL_COMMUNITY): Payer: Self-pay

## 2023-07-11 NOTE — Telephone Encounter (Signed)
 Pharmacy Patient Advocate Encounter   Received notification from CoverMyMeds that prior authorization for Lidocaine  5% patches is required/requested.   Insurance verification completed.   The patient is insured through [facility] .   Per test claim: PA required; PA submitted to above mentioned insurance via CoverMyMeds Key/confirmation #/EOC [id number] Status is pending

## 2023-07-12 NOTE — Telephone Encounter (Signed)
 Pharmacy Patient Advocate Encounter  Received notification from [facility] that Prior Authorization for Lidocaine  5% patches has been DENIED.  See denial reason below. No denial letter attached in CMM. Will attach denial letter to Media tab once received.   PA #/Case ID/Reference #: [id number]

## 2023-07-16 DIAGNOSIS — F902 Attention-deficit hyperactivity disorder, combined type: Secondary | ICD-10-CM | POA: Diagnosis not present

## 2023-07-16 DIAGNOSIS — F410 Panic disorder [episodic paroxysmal anxiety] without agoraphobia: Secondary | ICD-10-CM | POA: Diagnosis not present

## 2023-07-16 DIAGNOSIS — F331 Major depressive disorder, recurrent, moderate: Secondary | ICD-10-CM | POA: Diagnosis not present

## 2023-07-16 DIAGNOSIS — F422 Mixed obsessional thoughts and acts: Secondary | ICD-10-CM | POA: Diagnosis not present

## 2023-07-18 ENCOUNTER — Encounter: Payer: Self-pay | Admitting: Family Medicine

## 2023-07-26 NOTE — Telephone Encounter (Signed)
 Information has been sent to clinical pharmacist for appeals review. It may take 5-7 days to prepare the necessary documentation to request the appeal from the insurance.

## 2023-07-29 ENCOUNTER — Telehealth: Payer: Self-pay

## 2023-07-29 ENCOUNTER — Telehealth: Payer: Self-pay | Admitting: Family Medicine

## 2023-07-29 ENCOUNTER — Encounter: Payer: Self-pay | Admitting: Family

## 2023-07-29 ENCOUNTER — Telehealth: Payer: Self-pay | Admitting: Pharmacist

## 2023-07-29 NOTE — Telephone Encounter (Signed)
 When the prior authorization was submitted and approved last year, the following question was answered yes: Is the requested drug being prescribed for any of the following: A) pain associated with post-herpetic neuralgia, B) pain associated with diabetic neuropathy, or C) pain associated with cancer-related neuropathy (including treatment-related neuropathy, such as neuropathy from radiation or chemotherapy)?  However, based on the current chart notes and the indication noted on the lidocaine  prescription, it is being used to treat cervical spondylosis without myelopathy or radiculopathy, which does not fall under any of the listed approved indications.  An appeal can only be submitted if the patient is using lidocaine  patches for one of the three approved conditions mentioned above. Please advise how you would like to proceed.  Thank you, Dene Fines, PharmD Clinical Pharmacist  [place]  Direct Dial: [phone]

## 2023-07-29 NOTE — Telephone Encounter (Signed)
 Copied from CRM [id number]. Topic: Appointments - Scheduling Inquiry for Clinic >> Jul 29, 2023 12:43 PM Danelle Dunning F wrote: Reason for CRM:   Please assist patient with finding a appointment with PCP for a quick check of her blood work for her Thyroid , liver and kidneys. Patient is aware that labs may need to be ordered but patient stated the PCP likes to see her prior to getting the labs completed. Please request for labs and schedule patient's appointment; Agent sent message due to PCP not having availability until July 31 and patient would like something sooner and that falls after 12pm.   Callback Number: [id number] >> Jul 29, 2023 12:47 PM Brittney F wrote: Please place the patient on the waitlist.

## 2023-07-29 NOTE — Telephone Encounter (Signed)
 Copied from CRM [id number]. Topic: Appointments - Scheduling Inquiry for Clinic >> Jul 29, 2023 12:47 PM Danelle Dunning F wrote: Please place the patient on the waitlist.

## 2023-07-29 NOTE — Telephone Encounter (Signed)
 Copied from CRM [id number]. Topic: Clinical - Medical Advice >> Jul 29, 2023 12:33 PM Brittney F wrote: Reason for CRM:   Patient is calling in to receive an update on the approval or denial of her lidocaine  (LIDODERM ) 5 %. Patient stated that the patches are necessary for her to complete day to day tasks. Patient did receive the denial message from the insurance provider; Please submit the appeal with a applicable cause and reach out to the patient with an update on the matter.  Callback Number: [id number]

## 2023-07-29 NOTE — Telephone Encounter (Signed)
 I agree, none of these would qualify her currently.  Do you think it would be worthwhile to do an appeal?

## 2023-07-29 NOTE — Telephone Encounter (Signed)
 Please see note from today regarding Lidocaine  response to requested appeal  Message from PA team was sent to Dr. Barbette Boom today

## 2023-07-31 NOTE — Telephone Encounter (Signed)
 Please notify patient that her insurance will not cover the lidocaine  patches and we did try to fill out forms requesting special authorization but they declined it.  They are over-the-counter so she can purchase them if they provide pain relief for her

## 2023-08-02 ENCOUNTER — Encounter: Payer: Self-pay | Admitting: Family Medicine

## 2023-08-02 NOTE — Telephone Encounter (Signed)
 Ok to move to 2nd week of July.  OK to use an acute if needed or we can offer to place on wait list

## 2023-08-02 NOTE — Telephone Encounter (Signed)
 Patient advised.

## 2023-08-07 ENCOUNTER — Telehealth: Payer: Self-pay

## 2023-08-07 NOTE — Telephone Encounter (Signed)
 Spoke with patient. She states that she spoke with insurance company about the Lidocaine  denial   and was told after giving list of her complaints that they are expediting a fax to be completed urgently as she only has 4 patches remaining. She states she was told that if her diagnoses she listed were added to this form that it should be approved.  She states the list she gave  Was  Pain related to scolosis and degenerative disc disease Arthritis Hip and knee replacement Shoulder surgery 4 vertebrae disc fusions Rheumatoid arthritis Pain throughout the body and the only way she can walk is with the Lidocaine  patch use.

## 2023-08-07 NOTE — Telephone Encounter (Signed)
 Copied from CRM [id number]. Topic: Clinical - Medical Advice >> Jul 29, 2023 12:33 PM Brittney F wrote: Reason for CRM:   Patient is calling in to receive an update on the approval or denial of her lidocaine  (LIDODERM ) 5 %. Patient stated that the patches are necessary for her to complete day to day tasks. Patient did receive the denial message from the insurance provider; Please submit the appeal with a applicable cause and reach out to the patient with an update on the matter.  Callback Number: [id number] >> Aug 07, 2023  1:31 PM Miquel SAILOR wrote: Patient received letter of denied of medication unless she has the underlining conditions. She stated these are incorrect. Needs call back to explain what is on the letter.  [phone]

## 2023-08-08 DIAGNOSIS — H2513 Age-related nuclear cataract, bilateral: Secondary | ICD-10-CM | POA: Diagnosis not present

## 2023-08-08 DIAGNOSIS — H04123 Dry eye syndrome of bilateral lacrimal glands: Secondary | ICD-10-CM | POA: Diagnosis not present

## 2023-08-13 ENCOUNTER — Telehealth: Payer: Self-pay

## 2023-08-13 NOTE — Telephone Encounter (Signed)
 Copied from CRM [id number]. Topic: Clinical - Prescription Issue >> Aug 12, 2023  3:11 PM Kevelyn M wrote: Reason for CRM: Patient is calling about a prior-auth for lidocaine  patch. Patient is out of the patches. Call back #[phone]

## 2023-08-13 NOTE — Telephone Encounter (Signed)
 [place],  Was the fax the patient mentioned in telephone encounter 08/07/2023 received and faxed back ?

## 2023-08-20 ENCOUNTER — Ambulatory Visit (INDEPENDENT_AMBULATORY_CARE_PROVIDER_SITE_OTHER): Admitting: Family Medicine

## 2023-08-20 ENCOUNTER — Encounter: Payer: Self-pay | Admitting: Family Medicine

## 2023-08-20 VITALS — BP 115/65 | HR 78 | Ht 63.0 in | Wt 183.0 lb

## 2023-08-20 DIAGNOSIS — E038 Other specified hypothyroidism: Secondary | ICD-10-CM

## 2023-08-20 DIAGNOSIS — R7301 Impaired fasting glucose: Secondary | ICD-10-CM | POA: Diagnosis not present

## 2023-08-20 DIAGNOSIS — F330 Major depressive disorder, recurrent, mild: Secondary | ICD-10-CM

## 2023-08-20 DIAGNOSIS — M5136 Other intervertebral disc degeneration, lumbar region with discogenic back pain only: Secondary | ICD-10-CM | POA: Diagnosis not present

## 2023-08-20 DIAGNOSIS — I10 Essential (primary) hypertension: Secondary | ICD-10-CM

## 2023-08-20 DIAGNOSIS — M05742 Rheumatoid arthritis with rheumatoid factor of left hand without organ or systems involvement: Secondary | ICD-10-CM

## 2023-08-20 DIAGNOSIS — M05741 Rheumatoid arthritis with rheumatoid factor of right hand without organ or systems involvement: Secondary | ICD-10-CM | POA: Diagnosis not present

## 2023-08-20 LAB — POCT GLYCOSYLATED HEMOGLOBIN (HGB A1C): Hemoglobin A1C: 5.6 % (ref 4.0–5.6)

## 2023-08-20 NOTE — Assessment & Plan Note (Signed)
 Blood pressure looks fantastic today.  Continue current regimen.

## 2023-08-20 NOTE — Progress Notes (Signed)
 Established Patient Office Visit  Subjective  Patient ID: [name], female    DOB: August 05, 1945  Age: 78 y.o. MRN: [id number]  Chief Complaint  Patient presents with   Hypertension   Hypothyroidism   ifg    HPI  6 mo f/u  Impaired fasting glucose-no increased thirst or urination. No symptoms consistent with hypoglycemia.  Hypothyroidism - Taking medication regularly in the AM away from food and vitamins, etc. No recent change to skin, hair, or energy levels.  Hypertension- Pt denies chest pain, SOB, dizziness, or heart palpitations.  Taking meds as directed w/o problems.  Denies medication side effects.    Had some questions about her lidocaine  patch.  Unfortunately insurance did not cover it.  And she wanted to know what her options are she mostly uses it on her low back she has a history rheumatoid and unfortunately arthritis is getting worse but she does not want to be on a biologic.  Did take methotrexate for about 18 months at 1 point and just felt bad on it and she is not interested in alternative options at this time    ROS    Objective:     BP 115/65   Pulse 78   Ht 5' 3 (1.6 m)   Wt 183 lb 0.6 oz (83 kg)   SpO2 98%   BMI 32.42 kg/m    Physical Exam Vitals and nursing note reviewed.  Constitutional:      Appearance: Normal appearance.  HENT:     Head: Normocephalic and atraumatic.  Eyes:     Conjunctiva/sclera: Conjunctivae normal.  Cardiovascular:     Rate and Rhythm: Normal rate and regular rhythm.  Pulmonary:     Effort: Pulmonary effort is normal.     Breath sounds: Normal breath sounds.  Skin:    General: Skin is warm and dry.  Neurological:     Mental Status: She is alert.  Psychiatric:        Mood and Affect: Mood normal.      Results for orders placed or performed in visit on 08/20/23  POCT HgB A1C  Result Value Ref Range   Hemoglobin A1C 5.6 4.0 - 5.6 %   HbA1c POC (<> result, manual entry)     HbA1c, POC (prediabetic  range)     HbA1c, POC (controlled diabetic range)        The 10-year ASCVD risk score (Arnett DK, et al., 2019) is: 26.2%    Assessment & Plan:   Problem List Items Addressed This Visit       Cardiovascular and Mediastinum   Essential hypertension, benign   Blood pressure looks fantastic today.  Continue current regimen.      Relevant Orders   CMP14+EGFR   TSH   Lipid panel     Endocrine   IFG (impaired fasting glucose)   A1c looks fantastic today she is doing a great job continue work on [employer] and regular exercise.  Lab Results  Component Value Date   HGBA1C 5.6 08/20/2023         Relevant Orders   POCT HgB A1C (Completed)   CMP14+EGFR   TSH   Lipid panel   Hypothyroidism - Primary   Ue to recheck TSH.       Relevant Orders   CMP14+EGFR   TSH   Lipid panel     Musculoskeletal and Integument   Rheumatoid arthritis with rheumatoid factor (HCC) (Chronic)   No longer on any Biologics and  does not want to be she is not interested.  She would prefer to just manage her pain as regular osteoarthritis.      Lumbar degenerative disc disease   Unfortunately her insurance would not cover the lidocaine  patches that were very specific for the diagnosis if they would cover it for.  So I did encourage her to check online and check some different prices at other pharmacies to see what it would cost her over-the-counter she does use Aspercreme sometimes.        Other   MDD (major depressive disorder), recurrent episode, mild (HCC)   She is happy with her current dose of Wellbutrin  we did discuss that at some point if she is still doing well we could consider decreasing her dose down to 150 mg.  Flowsheet Row Office Visit from 08/24/2022 in [facility] at [facility]  PHQ-9 Total Score 4         Relevant Medications   PAXIL  CR 12.5 MG 24 hr tablet    Return in about 6 months (around 02/20/2024) for Hypertension,  Pre-diabetes.    Dorothyann Byars, MD

## 2023-08-20 NOTE — Assessment & Plan Note (Signed)
 No longer on any Biologics and does not want to be she is not interested.  She would prefer to just manage her pain as regular osteoarthritis.

## 2023-08-20 NOTE — Assessment & Plan Note (Signed)
 She is happy with her current dose of Wellbutrin  we did discuss that at some point if she is still doing well we could consider decreasing her dose down to 150 mg.  Flowsheet Row Office Visit from 08/24/2022 in [facility] at [facility]  PHQ-9 Total Score 4

## 2023-08-20 NOTE — Assessment & Plan Note (Signed)
 Ue to recheck TSH.

## 2023-08-20 NOTE — Assessment & Plan Note (Addendum)
 A1c looks fantastic today she is doing a great job continue work on [employer] and regular exercise.  Lab Results  Component Value Date   HGBA1C 5.6 08/20/2023

## 2023-08-20 NOTE — Assessment & Plan Note (Signed)
 Unfortunately her insurance would not cover the lidocaine  patches that were very specific for the diagnosis if they would cover it for.  So I did encourage her to check online and check some different prices at other pharmacies to see what it would cost her over-the-counter she does use Aspercreme sometimes.

## 2023-08-22 DIAGNOSIS — H2513 Age-related nuclear cataract, bilateral: Secondary | ICD-10-CM | POA: Diagnosis not present

## 2023-08-22 DIAGNOSIS — H04123 Dry eye syndrome of bilateral lacrimal glands: Secondary | ICD-10-CM | POA: Diagnosis not present

## 2023-08-28 DIAGNOSIS — F410 Panic disorder [episodic paroxysmal anxiety] without agoraphobia: Secondary | ICD-10-CM | POA: Diagnosis not present

## 2023-08-28 DIAGNOSIS — F422 Mixed obsessional thoughts and acts: Secondary | ICD-10-CM | POA: Diagnosis not present

## 2023-08-28 DIAGNOSIS — F331 Major depressive disorder, recurrent, moderate: Secondary | ICD-10-CM | POA: Diagnosis not present

## 2023-08-28 DIAGNOSIS — F902 Attention-deficit hyperactivity disorder, combined type: Secondary | ICD-10-CM | POA: Diagnosis not present

## 2023-08-29 DIAGNOSIS — E038 Other specified hypothyroidism: Secondary | ICD-10-CM | POA: Diagnosis not present

## 2023-08-29 DIAGNOSIS — I10 Essential (primary) hypertension: Secondary | ICD-10-CM | POA: Diagnosis not present

## 2023-08-29 DIAGNOSIS — R7301 Impaired fasting glucose: Secondary | ICD-10-CM | POA: Diagnosis not present

## 2023-08-30 ENCOUNTER — Ambulatory Visit: Payer: Self-pay | Admitting: Family Medicine

## 2023-08-30 LAB — CMP14+EGFR
ALT: 16 IU/L (ref 0–32)
AST: 17 IU/L (ref 0–40)
Albumin: 4.1 g/dL (ref 3.8–4.8)
Alkaline Phosphatase: 61 IU/L (ref 44–121)
BUN/Creatinine Ratio: 24 (ref 12–28)
BUN: 20 mg/dL (ref 8–27)
Bilirubin Total: 0.3 mg/dL (ref 0.0–1.2)
CO2: 23 mmol/L (ref 20–29)
Calcium: 9.8 mg/dL (ref 8.7–10.3)
Chloride: 103 mmol/L (ref 96–106)
Creatinine, Ser: 0.84 mg/dL (ref 0.57–1.00)
Globulin, Total: 2.4 g/dL (ref 1.5–4.5)
Glucose: 106 mg/dL — ABNORMAL HIGH (ref 70–99)
Potassium: 4.7 mmol/L (ref 3.5–5.2)
Sodium: 139 mmol/L (ref 134–144)
Total Protein: 6.5 g/dL (ref 6.0–8.5)
eGFR: 71 mL/min/1.73 (ref >59)

## 2023-08-30 LAB — LIPID PANEL
Chol/HDL Ratio: 3.7 ratio (ref 0.0–4.4)
Cholesterol, Total: 210 mg/dL — ABNORMAL HIGH (ref 100–199)
HDL: 57 mg/dL (ref >39)
LDL Chol Calc (NIH): 127 mg/dL — ABNORMAL HIGH (ref 0–99)
Triglycerides: 147 mg/dL (ref 0–149)
VLDL Cholesterol Cal: 26 mg/dL (ref 5–40)

## 2023-08-30 LAB — TSH: TSH: 0.921 u[IU]/mL (ref 0.450–4.500)

## 2023-08-30 NOTE — Progress Notes (Signed)
 Hi [name], your metabolic panel is normal. Your LDL and total cholesterol is elevated. Continue to work on healthy diet and regular exercise. Your thyroid  looks great.

## 2023-09-04 DIAGNOSIS — H2512 Age-related nuclear cataract, left eye: Secondary | ICD-10-CM | POA: Diagnosis not present

## 2023-09-09 DIAGNOSIS — I495 Sick sinus syndrome: Secondary | ICD-10-CM | POA: Diagnosis not present

## 2023-09-09 DIAGNOSIS — Z950 Presence of cardiac pacemaker: Secondary | ICD-10-CM | POA: Diagnosis not present

## 2023-09-11 ENCOUNTER — Other Ambulatory Visit: Payer: Self-pay | Admitting: Family Medicine

## 2023-09-17 ENCOUNTER — Ambulatory Visit: Admitting: Family Medicine

## 2023-09-17 ENCOUNTER — Other Ambulatory Visit: Payer: Self-pay | Admitting: Family Medicine

## 2023-09-17 DIAGNOSIS — F331 Major depressive disorder, recurrent, moderate: Secondary | ICD-10-CM | POA: Diagnosis not present

## 2023-09-17 DIAGNOSIS — F422 Mixed obsessional thoughts and acts: Secondary | ICD-10-CM | POA: Diagnosis not present

## 2023-09-17 DIAGNOSIS — F902 Attention-deficit hyperactivity disorder, combined type: Secondary | ICD-10-CM | POA: Diagnosis not present

## 2023-09-17 DIAGNOSIS — F410 Panic disorder [episodic paroxysmal anxiety] without agoraphobia: Secondary | ICD-10-CM | POA: Diagnosis not present

## 2023-09-18 DIAGNOSIS — H25811 Combined forms of age-related cataract, right eye: Secondary | ICD-10-CM | POA: Diagnosis not present

## 2023-09-18 DIAGNOSIS — H2511 Age-related nuclear cataract, right eye: Secondary | ICD-10-CM | POA: Diagnosis not present

## 2023-09-27 ENCOUNTER — Other Ambulatory Visit: Payer: Self-pay | Admitting: Family Medicine

## 2023-10-03 DIAGNOSIS — M4150 Other secondary scoliosis, site unspecified: Secondary | ICD-10-CM | POA: Diagnosis not present

## 2023-10-03 DIAGNOSIS — Z96643 Presence of artificial hip joint, bilateral: Secondary | ICD-10-CM | POA: Diagnosis not present

## 2023-10-03 DIAGNOSIS — Z96653 Presence of artificial knee joint, bilateral: Secondary | ICD-10-CM | POA: Diagnosis not present

## 2023-10-24 DIAGNOSIS — F410 Panic disorder [episodic paroxysmal anxiety] without agoraphobia: Secondary | ICD-10-CM | POA: Diagnosis not present

## 2023-10-24 DIAGNOSIS — F902 Attention-deficit hyperactivity disorder, combined type: Secondary | ICD-10-CM | POA: Diagnosis not present

## 2023-10-24 DIAGNOSIS — F422 Mixed obsessional thoughts and acts: Secondary | ICD-10-CM | POA: Diagnosis not present

## 2023-10-24 DIAGNOSIS — F331 Major depressive disorder, recurrent, moderate: Secondary | ICD-10-CM | POA: Diagnosis not present

## 2023-11-05 ENCOUNTER — Other Ambulatory Visit: Payer: Self-pay | Admitting: Family Medicine

## 2023-11-11 ENCOUNTER — Encounter: Payer: Self-pay | Admitting: Family Medicine

## 2023-11-11 ENCOUNTER — Ambulatory Visit (INDEPENDENT_AMBULATORY_CARE_PROVIDER_SITE_OTHER): Admitting: Family Medicine

## 2023-11-11 VITALS — BP 122/78 | HR 90 | Temp 97.9°F | Ht 63.0 in | Wt 183.0 lb

## 2023-11-11 DIAGNOSIS — R3915 Urgency of urination: Secondary | ICD-10-CM

## 2023-11-11 DIAGNOSIS — N3000 Acute cystitis without hematuria: Secondary | ICD-10-CM

## 2023-11-11 LAB — POCT URINALYSIS DIPSTICK
Bilirubin, UA: NEGATIVE
Blood, UA: NEGATIVE
Glucose, UA: NEGATIVE
Ketones, UA: NEGATIVE
Leukocytes, UA: NEGATIVE
Nitrite, UA: NEGATIVE
Protein, UA: NEGATIVE
Spec Grav, UA: 1.015 (ref 1.010–1.025)
Urobilinogen, UA: 0.2 U/dL
pH, UA: 6 (ref 5.0–8.0)

## 2023-11-11 MED ORDER — SULFAMETHOXAZOLE-TRIMETHOPRIM 800-160 MG PO TABS: 1.0000 | ORAL_TABLET | Freq: Two times a day (BID) | ORAL | 0 refills | Status: AC

## 2023-11-11 NOTE — Progress Notes (Signed)
 OFFICE VISIT  11/11/2023  CC:  Chief Complaint  Patient presents with   Urinary Frequency    Patient is a 78 y.o. female who presents for urinary urgency.  HPI: About 1 week of urinary urgency and frequency and general malaise. Tmax 99.9. No burning with urination.  No vaginal discharge or itching. No abnormal odor to urine.  No blood in urine. No lower abdominal or suprapubic pain.  No nausea.  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   Depressive disorder, not elsewhere classified    Esophageal reflux    History of hiatal hernia    Hypertension    Localized osteoarthrosis not specified whether primary or secondary, lower leg    Other and unspecified hyperlipidemia    Other diseases of vocal cords    Peptic ulcer, unspecified site, unspecified as acute or chronic, without mention of hemorrhage, perforation, or obstruction    Pneumonia    bilat pneumonia 1987   Pre-diabetes    no meds,just diet controll   TMJ (temporomandibular joint disorder)    Unspecified arthropathy, hand    Unspecified asthma(493.90)    triggered with a virus    Unspecified hypothyroidism    Venous stasis ulcer of left lower extremity (HCC) 03/03/2015   By 1/17 biopsy      Past Surgical History:  Procedure Laterality Date   BREAST BIOPSY  09/07/2011   High Risk Lesion   BREAST EXCISIONAL BIOPSY Left    BREAST SURGERY     LUMPECTOMY / LEFT 10/12/2011   KNEE ARTHROSCOPY     bilat    NASAL SINUS SURGERY     ROTATOR CUFF REPAIR     right    thumb surgery      left hand 45 years ago    TOTAL HIP ARTHROPLASTY Right 08/10/2014   Procedure: RIGHT TOTAL  HIP ARTHROPLASTY ANTERIOR APPROACH;  Surgeon: Donnice Car, MD;  Location: [facility];  Service: Orthopedics;  Laterality: Right;   TOTAL HIP ARTHROPLASTY Left 08/22/2021   Procedure: TOTAL HIP ARTHROPLASTY ANTERIOR APPROACH;  Surgeon: Car Donnice, MD;  Location: [facility];  Service: Orthopedics;  Laterality: Left;   TOTAL KNEE ARTHROPLASTY Left 08/26/2018   Procedure: TOTAL KNEE ARTHROPLASTY;  Surgeon: Car Donnice, MD;  Location: [facility];  Service: Orthopedics;  Laterality: Left;  70 mins   TOTAL KNEE ARTHROPLASTY Right 07/31/2022   Procedure: TOTAL KNEE ARTHROPLASTY;  Surgeon: Car Donnice, MD;  Location: [facility];  Service: Orthopedics;  Laterality: Right;    Outpatient Medications Prior to Visit  Medication Sig Dispense Refill   albuterol  (VENTOLIN  HFA) 108 (90 Base) MCG/ACT inhaler INHALE 2 PUFFS INTO THE LUNGS EVERY 6 HOURS AS NEEDED FOR SHORTNESS OF BREATH 8.5 each 2   Ascorbic Acid (VITAMIN C WITH ROSE HIPS) 1000 MG tablet Take 1,000 mg by mouth daily.     budesonide -formoterol  (SYMBICORT ) 80-4.5 MCG/ACT inhaler Inhale 2 puffs into the lungs in the morning and at bedtime. Using daily. 1 each 5   buPROPion  (WELLBUTRIN  XL) 300 MG 24 hr tablet Take 300 mg  by mouth daily.     Calcium  Carb-Cholecalciferol (CALCIUM  600+D3 PO) Take 1 tablet by mouth 2 (two) times daily.     Cholecalciferol (VITAMIN D3) 50 MCG (2000 UT) capsule Take 2,000 Units by mouth daily.     clobetasol  ointment (TEMOVATE ) 0.05 % Apply 1 Application topically 2 (two) times daily as needed. 30 g 1   famotidine  (PEPCID ) 20 MG tablet Take 20 mg by mouth at bedtime.      fexofenadine  (ALLEGRA ) 180 MG tablet Take 180 mg by mouth at bedtime.     folic acid  (FOLVITE ) 1 MG tablet Take 1 tablet (1 mg total) by mouth daily. 90 tablet 3   Ginkgo Biloba (GNP GINGKO BILOBA EXTRACT PO) Take 1 tablet by mouth 3 (three) times daily with meals.     hydrochlorothiazide  (HYDRODIURIL ) 25 MG tablet TAKE 1 TABLET BY MOUTH EVERY DAY 90 tablet 3    losartan  (COZAAR ) 50 MG tablet TAKE 1 TABLET BY MOUTH EVERY DAY 90 tablet 3   Magnesium  100 MG TABS Take 100 mg by mouth 2 (two) times a day.     meloxicam  (MOBIC ) 15 MG tablet TAKE 1 TABLET BY MOUTH EVERY DAY IN THE MORNING 90 tablet 0   metoprolol  succinate (TOPROL -XL) 25 MG 24 hr tablet Take 1 tablet by mouth daily.     MIEBO 1.338 GM/ML SOLN      OVER THE COUNTER MEDICATION Apply 1 Application topically 2 (two) times daily. CBD roll on pain reliever     PAXIL  CR 12.5 MG 24 hr tablet Take 12.5 mg by mouth daily.     polyethylene glycol (MIRALAX  / GLYCOLAX ) 17 g packet Take 17 g by mouth daily as needed for mild constipation. 14 each 0   Propylene Glycol, PF, (SYSTANE COMPLETE PF) 0.6 % SOLN Place 1 drop into both eyes 2 (two) times daily.     SYNTHROID  75 MCG tablet TAKE 1 TABLET BY MOUTH DAILY BEFORE BREAKFAST. 90 tablet 0   TART CHERRY PO Take 2 capsules by mouth daily.     No facility-administered medications prior to visit.    Allergies  Allergen Reactions   Moxifloxacin Other (See Comments)    confusion, dizziness, paranoia   Other     Black pepper -stomach pain   Astelin [Azelastine Hcl] Other (See Comments)    Headache   Celebrex  [Celecoxib ] Other (See Comments)    Stomach issues   Lisinopril  Cough   Methotrexate Derivatives Other (See Comments)    Flu like symptoms    Morphine Nausea And Vomiting    Review of Systems  As per HPI  PE:    11/11/2023    1:47 PM 08/20/2023    1:16 PM 06/13/2023    2:37 PM  Vitals with BMI  Height 5' 3 5' 3 5' 3  Weight 183 lbs 183 lbs 1 oz 186 lbs  BMI 32.43 32.43 32.96  Systolic 122 115 878  Diastolic 78 65 72  Pulse 90 78 71     Physical Exam  Gen: Alert, well appearing.  Patient is oriented to person, place, time, and situation. AFFECT: pleasant, lucid thought and speech. No further exam today  LABS:  Last CBC Lab Results  Component Value Date   WBC 6.8 06/12/2023   HGB 12.0 06/12/2023   HCT 36.6 06/12/2023   MCV  89.5 06/12/2023   MCH 29.3 06/12/2023   RDW 13.7 06/12/2023   PLT 267 06/12/2023   Last metabolic panel Lab Results  Component Value  Date   GLUCOSE 106 (H) 08/29/2023   NA 139 08/29/2023   K 4.7 08/29/2023   CL 103 08/29/2023   CO2 23 08/29/2023   BUN 20 08/29/2023   CREATININE 0.84 08/29/2023   EGFR 71 08/29/2023   CALCIUM  9.8 08/29/2023   PROT 6.5 08/29/2023   ALBUMIN 4.1 08/29/2023   LABGLOB 2.4 08/29/2023   BILITOT 0.3 08/29/2023   ALKPHOS 61 08/29/2023   AST 17 08/29/2023   ALT 16 08/29/2023   ANIONGAP 8 08/01/2022   IMPRESSION AND PLAN:  Acute UTI. Her symptoms are consistent with lower UTI, although her urinalysis today is normal.  Will start Bactrim  DS, 1 twice daily x 3 days.  Sent urine specimen for culture today.  An After Visit Summary was printed and given to the patient.  FOLLOW UP: Return if symptoms worsen or fail to improve.  Signed:  Gerlene Hockey, MD           11/11/2023

## 2023-11-12 LAB — URINE CULTURE
MICRO NUMBER:: 17030094
SPECIMEN QUALITY:: ADEQUATE

## 2023-11-13 ENCOUNTER — Ambulatory Visit: Payer: Self-pay | Admitting: Family Medicine

## 2023-11-14 DIAGNOSIS — H04123 Dry eye syndrome of bilateral lacrimal glands: Secondary | ICD-10-CM | POA: Diagnosis not present

## 2023-11-19 DIAGNOSIS — F422 Mixed obsessional thoughts and acts: Secondary | ICD-10-CM | POA: Diagnosis not present

## 2023-11-19 DIAGNOSIS — F902 Attention-deficit hyperactivity disorder, combined type: Secondary | ICD-10-CM | POA: Diagnosis not present

## 2023-11-19 DIAGNOSIS — F331 Major depressive disorder, recurrent, moderate: Secondary | ICD-10-CM | POA: Diagnosis not present

## 2023-11-19 DIAGNOSIS — F410 Panic disorder [episodic paroxysmal anxiety] without agoraphobia: Secondary | ICD-10-CM | POA: Diagnosis not present

## 2023-11-29 ENCOUNTER — Other Ambulatory Visit: Payer: Self-pay | Admitting: Family Medicine

## 2023-11-29 DIAGNOSIS — I10 Essential (primary) hypertension: Secondary | ICD-10-CM | POA: Diagnosis not present

## 2023-11-29 DIAGNOSIS — Z950 Presence of cardiac pacemaker: Secondary | ICD-10-CM | POA: Diagnosis not present

## 2023-11-29 DIAGNOSIS — I498 Other specified cardiac arrhythmias: Secondary | ICD-10-CM | POA: Diagnosis not present

## 2023-11-29 DIAGNOSIS — Z1330 Encounter for screening examination for mental health and behavioral disorders, unspecified: Secondary | ICD-10-CM | POA: Diagnosis not present

## 2023-11-29 DIAGNOSIS — I700 Atherosclerosis of aorta: Secondary | ICD-10-CM | POA: Diagnosis not present

## 2023-11-29 DIAGNOSIS — I471 Supraventricular tachycardia, unspecified: Secondary | ICD-10-CM | POA: Diagnosis not present

## 2023-11-29 DIAGNOSIS — I495 Sick sinus syndrome: Secondary | ICD-10-CM | POA: Diagnosis not present

## 2023-12-03 ENCOUNTER — Ambulatory Visit (HOSPITAL_BASED_OUTPATIENT_CLINIC_OR_DEPARTMENT_OTHER): Payer: Self-pay | Admitting: Pulmonary Disease

## 2023-12-03 ENCOUNTER — Ambulatory Visit (HOSPITAL_BASED_OUTPATIENT_CLINIC_OR_DEPARTMENT_OTHER)

## 2023-12-03 NOTE — Telephone Encounter (Signed)
 FYI Only or Action Required?: FYI only for provider.  Patient is followed in Pulmonology for Asthma, last seen on 05/02/2023 by Parrett, Madelin RAMAN, NP.  Called Nurse Triage reporting Cough.  Symptoms began 1-2 weeks ago.  Interventions attempted: Rescue inhaler.  Symptoms are: unchanged.  Triage Disposition: See PCP When Office is Open (Within 3 Days)  Patient/caregiver understands and will follow disposition?: Yes       Reason for Triage: Experiencing cough from asthma - does not feel sick but the cough is driving her crazy, states she has also been using her inhaler but its not helping.   Callback number: [phone]        Reason for Disposition  [1] Also has allergy  symptoms (e.g., itchy eyes, clear nasal discharge, postnasal drip) AND [2] they are acting up  Answer Assessment - Initial Assessment Questions 1. ONSET: When did the cough begin?      A week or two  2. SEVERITY: How bad is the cough today?      Mild  3. SPUTUM: Describe the color of your sputum (e.g., none, dry cough; clear, white, yellow, green)     No 4. HEMOPTYSIS: Are you coughing up any blood? If Yes, ask: How much? (e.g., flecks, streaks, tablespoons, etc.)     No 5. DIFFICULTY BREATHING: Are you having difficulty breathing? If Yes, ask: How bad is it? (e.g., mild, moderate, severe)      No 6. FEVER: Do you have a fever? If Yes, ask: What is your temperature, how was it measured, and when did it start?     No 7. CARDIAC HISTORY: Do you have any history of heart disease? (e.g., heart attack, congestive heart failure)      Yes 8. LUNG HISTORY: Do you have any history of lung disease?  (e.g., pulmonary embolus, asthma, emphysema)     Asthma  9. PE RISK FACTORS: Do you have a history of blood clots? (or: recent major surgery, recent prolonged travel, bedridden)     No 10. OTHER SYMPTOMS: Do you have any other symptoms? (e.g., runny nose, wheezing, chest pain)        Tickle in throat  Protocols used: Cough - Acute Non-Productive-A-AH

## 2023-12-05 ENCOUNTER — Ambulatory Visit (INDEPENDENT_AMBULATORY_CARE_PROVIDER_SITE_OTHER)

## 2023-12-05 ENCOUNTER — Ambulatory Visit (INDEPENDENT_AMBULATORY_CARE_PROVIDER_SITE_OTHER): Admitting: Adult Health

## 2023-12-05 ENCOUNTER — Ambulatory Visit: Payer: Self-pay | Admitting: Adult Health

## 2023-12-05 ENCOUNTER — Encounter: Payer: Self-pay | Admitting: Adult Health

## 2023-12-05 VITALS — BP 124/79 | HR 77 | Temp 98.3°F | Ht 62.0 in | Wt 185.6 lb

## 2023-12-05 DIAGNOSIS — M05741 Rheumatoid arthritis with rheumatoid factor of right hand without organ or systems involvement: Secondary | ICD-10-CM | POA: Diagnosis not present

## 2023-12-05 DIAGNOSIS — J4541 Moderate persistent asthma with (acute) exacerbation: Secondary | ICD-10-CM

## 2023-12-05 DIAGNOSIS — J309 Allergic rhinitis, unspecified: Secondary | ICD-10-CM | POA: Diagnosis not present

## 2023-12-05 DIAGNOSIS — I5032 Chronic diastolic (congestive) heart failure: Secondary | ICD-10-CM | POA: Diagnosis not present

## 2023-12-05 DIAGNOSIS — H04123 Dry eye syndrome of bilateral lacrimal glands: Secondary | ICD-10-CM

## 2023-12-05 DIAGNOSIS — M05742 Rheumatoid arthritis with rheumatoid factor of left hand without organ or systems involvement: Secondary | ICD-10-CM

## 2023-12-05 MED ORDER — PREDNISONE 20 MG PO TABS: 20.0000 mg | ORAL_TABLET | Freq: Every day | ORAL | 0 refills | Status: DC

## 2023-12-05 NOTE — Progress Notes (Signed)
 @Patient  ID: [name], female    DOB: 11-Dec-1945, 78 y.o.   MRN: [id number]  Chief Complaint  Patient presents with   Acute Visit    Dry Cough x80mo    Referring provider: Alvan Dorothyann BIRCH, *  HPI: 78 yo female with minimal smoking history followed for Asthma, Allergic Rhinitis and chronic cough Medical history significant for rheumatoid arthritis, osteoarthritis previously on methotrexate. Iron deficiency anemia     TEST/EVENTS : Reviewed 12/05/2023  Spirometry 06/2013 ratio 63 FEV1 normal  PFT 2025 normal  Eosinophils 11/2022 400   Discussed the use of AI scribe software for clinical note transcription with the patient, who gave verbal consent to proceed.  History of Present Illness [name] is an 78 year old female with asthma and rheumatoid arthritis who presents with increased coughing and nasal drainage for last month.  She has experienced increased coughing and nasal drainage. She uses Symbicort  two puffs every morning and occasionally uses albuterol  if she cannot stop coughing. She also uses Delsym in the evening if the cough persists and Ricola lozenges. Tessalon  is taken at bedtime if needed. Has not used delsym or tessalon  in a while. No fever or discolored mucus is present, and she denies feeling sick.  She has a history of cataract surgeries in April and May and suffers from dry eyes. She uses multiple eye drops including Systane PF, Miebo, and Restasis,   She takes Mucinex  1200 mg twice daily for drainage She also takes Allegra  and doTERRA triese for allergies. Simply Saline is used for nasal irrigation, and she avoids nasal sprays due to loss of smell, only using Flonase  when she cannot breathe at all.  She has rheumatoid arthritis but is not on medication for it due to concerns about side effects.   She remains active, walking for exercise, but notes becoming short of breath and unable to walk as fast as before. She has a history of a pacemaker  placement and mentions her heart rate does not increase during exercise.  She lives an active life with her grandchildren and enjoys cooking, but acknowledges feeling tired at times. She has gained weight recently, which she attributes to recent celebrations and family gatherings.     Allergies  Allergen Reactions   Moxifloxacin Other (See Comments)    confusion, dizziness, paranoia   Other     Black pepper -stomach pain   Astelin [Azelastine Hcl] Other (See Comments)    Headache   Celebrex  [Celecoxib ] Other (See Comments)    Stomach issues   Lisinopril  Cough   Methotrexate And Trimetrexate Other (See Comments)    Flu like symptoms    Morphine Nausea And Vomiting    Immunization History  Administered Date(s) Administered   Fluad Quad(high Dose 65+) 12/24/2018, 12/28/2019, 12/14/2020, 01/29/2022   Fluad Trivalent(High Dose 65+) 12/20/2022   INFLUENZA, HIGH DOSE SEASONAL PF 11/19/2016, 12/10/2017   Influenza Split 11/07/2011, 01/12/2013   Influenza Whole 02/12/2005, 12/02/2007, 11/24/2008   Influenza,inj,Quad PF,6+ Mos 01/11/2015   Influenza-Unspecified 11/07/2011, 01/12/2013, 11/12/2013, 01/11/2015, 12/24/2018   PFIZER(Purple Top)SARS-COV-2 Vaccination 03/20/2019, 04/15/2019   Pneumococcal Conjugate-13 04/26/2014   Pneumococcal Polysaccharide-23 02/12/2001, 11/27/2001, 11/28/2006, 11/07/2011   Td 02/12/1998, 06/13/1998   Tdap 09/23/2012    Past Medical History:  Diagnosis Date   Allergic rhinitis, cause unspecified    Anemia    iron   Anxiety    Asthma    Benign paroxysmal positional vertigo    Cancer (HCC)    basal cell carcinoma per right  side of nostril   CHF (congestive heart failure) (HCC) 11/09/2016   had 2+ edema in legs with anxiety and SOB   Complication of anesthesia    has 4  degenerative discs in neck.   Depressive disorder, not elsewhere classified    Esophageal reflux    History of hiatal hernia    Hypertension    Localized osteoarthrosis not  specified whether primary or secondary, lower leg    Other and unspecified hyperlipidemia    Other diseases of vocal cords    Peptic ulcer, unspecified site, unspecified as acute or chronic, without mention of hemorrhage, perforation, or obstruction    Pneumonia    bilat pneumonia 1987   Pre-diabetes    no meds,just diet controll   TMJ (temporomandibular joint disorder)    Unspecified arthropathy, hand    Unspecified asthma(493.90)    triggered with a virus    Unspecified hypothyroidism    Venous stasis ulcer of left lower extremity (HCC) 03/03/2015   By 1/17 biopsy      Tobacco History: Social History   Tobacco Use  Smoking Status Former   Current packs/day: 0.00   Average packs/day: 0.3 packs/day for 2.0 years (0.6 ttl pk-yrs)   Types: Cigarettes   Start date: 02/13/1956   Quit date: 02/12/1958   Years since quitting: 65.8  Smokeless Tobacco Never   Counseling given: Not Answered   Outpatient Medications Prior to Visit  Medication Sig Dispense Refill   albuterol  (VENTOLIN  HFA) 108 (90 Base) MCG/ACT inhaler INHALE 2 PUFFS INTO THE LUNGS EVERY 6 HOURS AS NEEDED FOR SHORTNESS OF BREATH 8.5 each 2   Ascorbic Acid (VITAMIN C WITH ROSE HIPS) 1000 MG tablet Take 1,000 mg by mouth daily.     budesonide -formoterol  (SYMBICORT ) 80-4.5 MCG/ACT inhaler Inhale 2 puffs into the lungs in the morning and at bedtime. Using daily. 1 each 5   buPROPion  (WELLBUTRIN  XL) 300 MG 24 hr tablet Take 300 mg by mouth daily.     Calcium  Carb-Cholecalciferol (CALCIUM  600+D3 PO) Take 1 tablet by mouth 2 (two) times daily.     Cholecalciferol (VITAMIN D3) 50 MCG (2000 UT) capsule Take 2,000 Units by mouth daily.     clobetasol  ointment (TEMOVATE ) 0.05 % Apply 1 Application topically 2 (two) times daily as needed. 30 g 1   famotidine  (PEPCID ) 20 MG tablet Take 20 mg by mouth at bedtime.      fexofenadine  (ALLEGRA ) 180 MG tablet Take 180 mg by mouth at bedtime.     folic acid  (FOLVITE ) 1 MG tablet Take 1  tablet (1 mg total) by mouth daily. 90 tablet 3   Ginkgo Biloba (GNP GINGKO BILOBA EXTRACT PO) Take 1 tablet by mouth 3 (three) times daily with meals.     hydrochlorothiazide  (HYDRODIURIL ) 25 MG tablet TAKE 1 TABLET BY MOUTH EVERY DAY 90 tablet 1   losartan  (COZAAR ) 50 MG tablet TAKE 1 TABLET BY MOUTH EVERY DAY 90 tablet 3   Magnesium  100 MG TABS Take 100 mg by mouth 2 (two) times a day.     meloxicam  (MOBIC ) 15 MG tablet TAKE 1 TABLET BY MOUTH EVERY DAY IN THE MORNING 90 tablet 0   metoprolol  succinate (TOPROL -XL) 25 MG 24 hr tablet Take 1 tablet by mouth daily.     MIEBO 1.338 GM/ML SOLN      OVER THE COUNTER MEDICATION Take 1 capsule by mouth daily.     PAXIL  CR 12.5 MG 24 hr tablet Take 12.5 mg by mouth daily.  Propylene Glycol, PF, (SYSTANE COMPLETE PF) 0.6 % SOLN Place 1 drop into both eyes 2 (two) times daily.     SYNTHROID  75 MCG tablet TAKE 1 TABLET BY MOUTH DAILY BEFORE BREAKFAST. 90 tablet 0   OVER THE COUNTER MEDICATION Apply 1 Application topically 2 (two) times daily. CBD roll on pain reliever     polyethylene glycol (MIRALAX  / GLYCOLAX ) 17 g packet Take 17 g by mouth daily as needed for mild constipation. (Patient not taking: Reported on 12/05/2023) 14 each 0   TART CHERRY PO Take 2 capsules by mouth daily. (Patient not taking: Reported on 12/05/2023)     No facility-administered medications prior to visit.     Review of Systems:   Constitutional:   No  weight loss, night sweats,  Fevers, chills, fatigue, or  lassitude.  HEENT:   No headaches,  Difficulty swallowing,  Tooth/dental problems, or  Sore throat,                No sneezing, itching, ear ache, ++nasal congestion, post nasal drip,   CV:  No chest pain,  Orthopnea, PND, swelling in lower extremities, anasarca, dizziness, palpitations, syncope.   GI  No heartburn, indigestion, abdominal pain, nausea, vomiting, diarrhea, change in bowel habits, loss of appetite, bloody stools.   Resp:  No chest wall  deformity  Skin: no rash or lesions.  GU: no dysuria, change in color of urine, no urgency or frequency.  No flank pain, no hematuria   MS: Chronic joint stiffness   Physical Exam  BP (!) 149/81   Pulse 77   Temp 98.3 F (36.8 C)   Ht 5' 2 (1.575 m)   Wt 185 lb 9.6 oz (84.2 kg)   SpO2 99% Comment: RA  BMI 33.95 kg/m   GEN: A/Ox3; pleasant , NAD, well nourished    HEENT:  [place]/AT,  NOSE-clear, THROAT-clear, no lesions, no postnasal drip or exudate noted.   NECK:  Supple w/ fair ROM; no JVD; normal carotid impulses w/o bruits; no thyromegaly or nodules palpated; no lymphadenopathy.    RESP  Clear  P & A; w/o, wheezes/ rales/ or rhonchi. no accessory muscle use, no dullness to percussion  CARD:  RRR, no m/r/g, no peripheral edema, pulses intact, no cyanosis or clubbing.  GI:   Soft & nt; nml bowel sounds; no organomegaly or masses detected.   Musco: Warm bil, arthritic changes in the hands  Neuro: alert, no focal deficits noted.    Skin: Warm, no lesions or rashes    Lab Results:Reviewed 12/05/2023   CBC    Component Value Date/Time   WBC 6.8 06/12/2023 1353   RBC 4.09 06/12/2023 1353   HGB 12.0 06/12/2023 1353   HGB 12.8 12/12/2022 1354   HGB 13.0 04/17/2012 1445   HCT 36.6 06/12/2023 1353   HCT 38.8 04/17/2012 1445   PLT 267 06/12/2023 1353   PLT 279 12/12/2022 1354   PLT 313 04/17/2012 1445   MCV 89.5 06/12/2023 1353   MCV 88.2 04/17/2012 1445   MCH 29.3 06/12/2023 1353   MCHC 32.8 06/12/2023 1353   RDW 13.7 06/12/2023 1353   RDW 14.3 04/17/2012 1445   LYMPHSABS 1.4 12/12/2022 1354   LYMPHSABS 1.6 04/17/2012 1445   MONOABS 0.7 12/12/2022 1354   MONOABS 0.5 04/17/2012 1445   EOSABS 0.4 12/12/2022 1354   EOSABS 0.5 04/17/2012 1445   BASOSABS 0.1 12/12/2022 1354   BASOSABS 0.1 04/17/2012 1445    BMET    Component  Value Date/Time   NA 139 08/29/2023 1121   NA 143 04/17/2012 1445   K 4.7 08/29/2023 1121   K 3.8 04/17/2012 1445   CL 103  08/29/2023 1121   CL 106 04/17/2012 1445   CO2 23 08/29/2023 1121   CO2 27 04/17/2012 1445   GLUCOSE 106 (H) 08/29/2023 1121   GLUCOSE 151 (H) 08/01/2022 0353   GLUCOSE 149 (H) 04/17/2012 1445   BUN 20 08/29/2023 1121   BUN 15.6 04/17/2012 1445   CREATININE 0.84 08/29/2023 1121   CREATININE 0.86 11/27/2021 1507   CREATININE 0.96 11/08/2021 0000   CREATININE 0.8 04/17/2012 1445   CALCIUM  9.8 08/29/2023 1121   CALCIUM  9.2 04/17/2012 1445   GFRNONAA >60 08/01/2022 0353   GFRNONAA >60 11/27/2021 1507   GFRNONAA 81 09/16/2019 1209   GFRAA 93 09/16/2019 1209    BNP    Component Value Date/Time   BNP 21.0 09/26/2017 1532   BNP 15 11/07/2016 1221    ProBNP    Component Value Date/Time   PROBNP 21.0 06/22/2013 1548    Imaging: No results found.  Administration History     None          Latest Ref Rng & Units 04/11/2023    2:42 PM  PFT Results  FVC-Pre L 2.36   FVC-Predicted Pre % 90   FVC-Post L 2.33   FVC-Predicted Post % 89   Pre FEV1/FVC % % 78   Post FEV1/FCV % % 82   FEV1-Pre L 1.83   FEV1-Predicted Pre % 95   FEV1-Post L 1.90   DLCO uncorrected ml/min/mmHg 16.16   DLCO UNC% % 87   DLCO corrected ml/min/mmHg 16.16   DLCO COR %Predicted % 87   DLVA Predicted % 103   TLC L 4.29   TLC % Predicted % 86   RV % Predicted % 73     No results found for: NITRICOXIDE      No data to display              Assessment & Plan:   Assessment and Plan Assessment & Plan Asthma flare with acute cough and allergic rhinitis exacerbation   Asthma exacerbation with increased coughing and allergic rhinitis symptoms, likely due to seasonal changes and allergies. No fever or discolored mucus suggests no infection. Recent breathing test and previous chest x-ray were normal. Symptoms include increased coughing, nasal drainage, and dry eyes,  Order a chest x-ray to rule out underlying issues. Increase Symbicort  to two puffs twice daily. Prescribe prednisone  20 mg  daily for five days. Switch from Allegra  to Zyrtec 10 mg daily. Use Flonase  nasal spray, one squirt per nostril daily for a few days. Use Simply Saline nasal spray regularly. Use Delsym, two teaspoons twice daily, and Tessalon , one capsule three times daily, to manage cough. Follow up in two to three months to assess symptom improvement.  Chronic allergic rhinitis-exacerbation Change Allegra  to Zyrtec daily.  Add Flonase  for the next few days and as needed continue with simply saline nasal spray and gel.   Chronic dry eyes-continue follow-up with ophthalmology.  Continue all current regimen.  Decrease Mucinex  to 600 mg twice daily as needed  Rheumatoid arthritis-appears stable not on maintenance therapy Chest x-ray pending today.  CHF-appears euvolemic on exam.  Continue follow-up with cardiology and continue on maintenance regimen   Plan  Patient Instructions  Chest xray today  Prednisone  20mg  daily for 5 days  Increase Symbicort  2 puffs Twice daily,  rinse  after use. May use with spacer.  Albuterol  inhaler As needed   Saline nasal rinses As needed   Change Allegra  to Zyrtec 10mg  daily  May use Flonase  1 puff each nare daily As needed   Saline nasal As needed   Decrease Mucinex  600mg   As needed   Delsym 2 tsp Twice daily  As needed  Cough .  Tessalon  Three times a day  for cough As needed   Follow up Dr. Jude  or Lakeria Starkman NP 2-3 and As needed   Please contact office for sooner follow up if symptoms do not improve or worsen or seek emergency care         Alandria Butkiewicz, NP 12/05/2023  I spent  34 minutes dedicated to the care of this patient on the date of this encounter to include pre-visit review of records, face-to-face time with the patient discussing conditions above, post visit ordering of testing, clinical documentation with the electronic health record, making appropriate referrals as documented, and communicating necessary findings to members of the patients care team.

## 2023-12-05 NOTE — Patient Instructions (Addendum)
 Chest xray today  Prednisone  20mg  daily for 5 days  Increase Symbicort  2 puffs Twice daily,  rinse after use. May use with spacer.  Albuterol  inhaler As needed   Saline nasal rinses As needed   Change Allegra  to Zyrtec 10mg  daily  May use Flonase  1 puff each nare daily As needed   Saline nasal As needed   Decrease Mucinex  600mg   As needed   Delsym 2 tsp Twice daily  As needed  Cough .  Tessalon  Three times a day  for cough As needed   Follow up Dr. Jude  or Ukiah Trawick NP 2-3 and As needed   Please contact office for sooner follow up if symptoms do not improve or worsen or seek emergency care

## 2023-12-09 DIAGNOSIS — I495 Sick sinus syndrome: Secondary | ICD-10-CM | POA: Diagnosis not present

## 2023-12-09 DIAGNOSIS — Z950 Presence of cardiac pacemaker: Secondary | ICD-10-CM | POA: Diagnosis not present

## 2023-12-10 ENCOUNTER — Other Ambulatory Visit: Payer: Self-pay | Admitting: Medical Oncology

## 2023-12-10 DIAGNOSIS — E611 Iron deficiency: Secondary | ICD-10-CM

## 2023-12-10 DIAGNOSIS — D649 Anemia, unspecified: Secondary | ICD-10-CM

## 2023-12-11 ENCOUNTER — Ambulatory Visit: Admitting: Medical Oncology

## 2023-12-11 ENCOUNTER — Inpatient Hospital Stay: Attending: Medical Oncology

## 2023-12-15 ENCOUNTER — Other Ambulatory Visit: Payer: Self-pay | Admitting: Family Medicine

## 2023-12-16 ENCOUNTER — Encounter: Payer: Self-pay | Admitting: Family

## 2023-12-16 ENCOUNTER — Inpatient Hospital Stay: Admitting: Family

## 2023-12-16 ENCOUNTER — Inpatient Hospital Stay: Attending: Medical Oncology

## 2023-12-16 VITALS — BP 108/57 | HR 73 | Temp 97.8°F | Resp 20 | Ht 62.0 in | Wt 182.8 lb

## 2023-12-16 DIAGNOSIS — Z96642 Presence of left artificial hip joint: Secondary | ICD-10-CM | POA: Diagnosis not present

## 2023-12-16 DIAGNOSIS — D509 Iron deficiency anemia, unspecified: Secondary | ICD-10-CM | POA: Insufficient documentation

## 2023-12-16 DIAGNOSIS — D649 Anemia, unspecified: Secondary | ICD-10-CM

## 2023-12-16 DIAGNOSIS — E611 Iron deficiency: Secondary | ICD-10-CM

## 2023-12-16 LAB — CBC WITH DIFFERENTIAL (CANCER CENTER ONLY)
Abs Immature Granulocytes: 0.04 K/uL (ref 0.00–0.07)
Basophils Absolute: 0.1 K/uL (ref 0.0–0.1)
Basophils Relative: 1 %
Eosinophils Absolute: 0.4 K/uL (ref 0.0–0.5)
Eosinophils Relative: 5 %
HCT: 39.7 % (ref 36.0–46.0)
Hemoglobin: 12.7 g/dL (ref 12.0–15.0)
Immature Granulocytes: 1 %
Lymphocytes Relative: 17 %
Lymphs Abs: 1.4 K/uL (ref 0.7–4.0)
MCH: 28.9 pg (ref 26.0–34.0)
MCHC: 32 g/dL (ref 30.0–36.0)
MCV: 90.4 fL (ref 80.0–100.0)
Monocytes Absolute: 0.9 K/uL (ref 0.1–1.0)
Monocytes Relative: 10 %
Neutro Abs: 5.5 K/uL (ref 1.7–7.7)
Neutrophils Relative %: 66 %
Platelet Count: 295 K/uL (ref 150–400)
RBC: 4.39 MIL/uL (ref 3.87–5.11)
RDW: 13.5 % (ref 11.5–15.5)
WBC Count: 8.3 K/uL (ref 4.0–10.5)
nRBC: 0 % (ref 0.0–0.2)

## 2023-12-16 LAB — IRON AND IRON BINDING CAPACITY (CC-WL,HP ONLY)
Iron: 56 ug/dL (ref 28–170)
Saturation Ratios: 16 % (ref 10.4–31.8)
TIBC: 343 ug/dL (ref 250–450)
UIBC: 287 ug/dL

## 2023-12-16 LAB — CMP (CANCER CENTER ONLY)
ALT: 18 U/L (ref 0–44)
AST: 17 U/L (ref 15–41)
Albumin: 4 g/dL (ref 3.5–5.0)
Alkaline Phosphatase: 57 U/L (ref 38–126)
Anion gap: 11 (ref 5–15)
BUN: 20 mg/dL (ref 8–23)
CO2: 27 mmol/L (ref 22–32)
Calcium: 9.6 mg/dL (ref 8.9–10.3)
Chloride: 102 mmol/L (ref 98–111)
Creatinine: 0.79 mg/dL (ref 0.44–1.00)
GFR, Estimated: >60 mL/min (ref >60)
Glucose, Bld: 119 mg/dL — ABNORMAL HIGH (ref 70–99)
Potassium: 4 mmol/L (ref 3.5–5.1)
Sodium: 140 mmol/L (ref 135–145)
Total Bilirubin: 0.2 mg/dL (ref 0.0–1.2)
Total Protein: 6.5 g/dL (ref 6.5–8.1)

## 2023-12-16 LAB — FERRITIN: Ferritin: 177 ng/mL (ref 11–307)

## 2023-12-16 NOTE — Progress Notes (Signed)
 Hematology and Oncology Follow Up Visit  [name] [id number] 13-Oct-1945 78 y.o. 12/16/2023   Principle Diagnosis:  Iron deficiency anemia    Current Therapy:        IV iron as indicated- Monoferric - 12/14/2021             -Oral iron intolerance    Interim History: [name] is here today for follow-up. She is doing well but notes fatigue.  She notes occasional SOB with over exertion.  No fever, chills, n/v, cough, rash, dizziness, chest pain, palpitations, abdominal pain or changes in bowel or bladder habits.  No swelling, numbness or tingling in her extremities.  No falls or syncope reported.  Appetite and hydration are good. Weight is stable at 182 lbs.  No blood loss noted. No bruising or petechiae.   ECOG Performance Status: 1 - Symptomatic but completely ambulatory  Medications:  Allergies as of 12/16/2023       Reactions   Moxifloxacin Other (See Comments)   confusion, dizziness, paranoia   Other    Black pepper -stomach pain   Astelin [azelastine Hcl] Other (See Comments)   Headache   Celebrex  [email or web address] ] Other (See Comments)   Stomach issues   Lisinopril  Cough   Methotrexate And Trimetrexate Other (See Comments)   Flu like symptoms    Morphine Nausea And Vomiting        Medication List        Accurate as of December 16, 2023  2:51 PM. If you have any questions, ask your nurse or doctor.          albuterol  108 (90 Base) MCG/ACT inhaler Commonly known as: VENTOLIN  HFA INHALE 2 PUFFS INTO THE LUNGS EVERY 6 HOURS AS NEEDED FOR SHORTNESS OF BREATH   budesonide -formoterol  80-4.5 MCG/ACT inhaler Commonly known as: Symbicort  Inhale 2 puffs into the lungs in the morning and at bedtime. Using daily.   buPROPion  300 MG 24 hr tablet Commonly known as: WELLBUTRIN  XL Take 300 mg by mouth daily.   CALCIUM  600+D3 PO Take 1 tablet by mouth 2 (two) times daily.   clobetasol  ointment 0.05 % Commonly known as: TEMOVATE  Apply 1 Application topically  2 (two) times daily as needed.   famotidine  20 MG tablet Commonly known as: PEPCID  Take 20 mg by mouth at bedtime.   fexofenadine  180 MG tablet Commonly known as: ALLEGRA  Take 180 mg by mouth at bedtime.   folic acid  1 MG tablet Commonly known as: FOLVITE  Take 1 tablet (1 mg total) by mouth daily.   GNP GINGKO BILOBA EXTRACT PO Take 1 tablet by mouth 3 (three) times daily with meals.   hydrochlorothiazide  25 MG tablet Commonly known as: HYDRODIURIL  TAKE 1 TABLET BY MOUTH EVERY DAY   losartan  50 MG tablet Commonly known as: COZAAR  TAKE 1 TABLET BY MOUTH EVERY DAY   Magnesium  100 MG Tabs Take 100 mg by mouth 2 (two) times a day.   meloxicam  15 MG tablet Commonly known as: MOBIC  TAKE 1 TABLET BY MOUTH EVERY DAY IN THE MORNING   metoprolol  succinate 25 MG 24 hr tablet Commonly known as: TOPROL -XL Take 1 tablet by mouth daily.   Miebo 1.338 GM/ML Soln Generic drug: Perfluorohexyloctane   OVER THE COUNTER MEDICATION Apply 1 Application topically 2 (two) times daily. CBD roll on pain reliever   OVER THE COUNTER MEDICATION Take 1 capsule by mouth daily.   Paxil  CR 12.5 MG 24 hr tablet Generic drug: PARoxetine  Take 12.5 mg by mouth daily.   polyethylene  glycol 17 g packet Commonly known as: MIRALAX  / GLYCOLAX  Take 17 g by mouth daily as needed for mild constipation.   predniSONE  20 MG tablet Commonly known as: DELTASONE  Take 1 tablet (20 mg total) by mouth daily with breakfast.   Synthroid  75 MCG tablet Generic drug: levothyroxine  TAKE 1 TABLET BY MOUTH DAILY BEFORE BREAKFAST.   Systane Complete PF 0.6 % Soln Generic drug: Propylene Glycol (PF) Place 1 drop into both eyes 2 (two) times daily.   TART CHERRY PO Take 2 capsules by mouth daily.   vitamin C with rose hips 1000 MG tablet Take 1,000 mg by mouth daily.   Vitamin D3 50 MCG (2000 UT) capsule Take 2,000 Units by mouth daily.        Allergies:  Allergies  Allergen Reactions   Moxifloxacin  Other (See Comments)    confusion, dizziness, paranoia   Other     Black pepper -stomach pain   Astelin [Azelastine Hcl] Other (See Comments)    Headache   Celebrex  [Celecoxib ] Other (See Comments)    Stomach issues   Lisinopril  Cough   Methotrexate And Trimetrexate Other (See Comments)    Flu like symptoms    Morphine Nausea And Vomiting    Past Medical History, Surgical history, Social history, and Family History were reviewed and updated.  Review of Systems: All other 10 point review of systems is negative.   Physical Exam:  vitals were not taken for this visit.   Wt Readings from Last 3 Encounters:  12/05/23 185 lb 9.6 oz (84.2 kg)  11/11/23 183 lb (83 kg)  08/20/23 183 lb 0.6 oz (83 kg)    Ocular: Sclerae unicteric, pupils equal, round and reactive to light Ear-nose-throat: Oropharynx clear, dentition fair Lymphatic: No cervical or supraclavicular adenopathy Lungs no rales or rhonchi, good excursion bilaterally Heart regular rate and rhythm, no murmur appreciated Abd soft, nontender, positive bowel sounds MSK no focal spinal tenderness, no joint edema Neuro: non-focal, well-oriented, appropriate affect Breasts: Deferred   Lab Results  Component Value Date   WBC 6.8 06/12/2023   HGB 12.0 06/12/2023   HCT 36.6 06/12/2023   MCV 89.5 06/12/2023   PLT 267 06/12/2023   Lab Results  Component Value Date   FERRITIN 160 06/12/2023   IRON 55 06/12/2023   TIBC 328 06/12/2023   UIBC 273 06/12/2023   IRONPCTSAT 17 06/12/2023   Lab Results  Component Value Date   RETICCTPCT 2.3 09/03/2022   RBC 4.09 06/12/2023   No results found for: KPAFRELGTCHN, LAMBDASER, KAPLAMBRATIO No results found for: IGGSERUM, IGA, IGMSERUM No results found for: STEPHANY CARLOTA BENSON MARKEL EARLA JOANNIE DOC VICK, SPEI   Chemistry      Component Value Date/Time   NA 139 08/29/2023 1121   NA 143 04/17/2012 1445   K 4.7 08/29/2023 1121   K  3.8 04/17/2012 1445   CL 103 08/29/2023 1121   CL 106 04/17/2012 1445   CO2 23 08/29/2023 1121   CO2 27 04/17/2012 1445   BUN 20 08/29/2023 1121   BUN 15.6 04/17/2012 1445   CREATININE 0.84 08/29/2023 1121   CREATININE 0.86 11/27/2021 1507   CREATININE 0.96 11/08/2021 0000   CREATININE 0.8 04/17/2012 1445   GLU 92 10/30/2019 0000      Component Value Date/Time   CALCIUM  9.8 08/29/2023 1121   CALCIUM  9.2 04/17/2012 1445   ALKPHOS 61 08/29/2023 1121   ALKPHOS 75 04/17/2012 1445   AST 17 08/29/2023 1121   AST 15  11/27/2021 1507   AST 18 04/17/2012 1445   ALT 16 08/29/2023 1121   ALT 20 11/27/2021 1507   ALT 40 04/17/2012 1445   BILITOT 0.3 08/29/2023 1121   BILITOT 0.4 11/27/2021 1507   BILITOT 0.31 04/17/2012 1445       Impression and Plan: Ms. Golden is a very pleasant 78 yo caucasian female with history of iron deficiency anemia post total left hip replacement surgery.   Iron studies are pending. We will replace if needed.  Follow-up in 6 months.   Lauraine Pepper, NP 11/3/20252:51 PM

## 2023-12-17 DIAGNOSIS — F410 Panic disorder [episodic paroxysmal anxiety] without agoraphobia: Secondary | ICD-10-CM | POA: Diagnosis not present

## 2023-12-17 DIAGNOSIS — F902 Attention-deficit hyperactivity disorder, combined type: Secondary | ICD-10-CM | POA: Diagnosis not present

## 2023-12-17 DIAGNOSIS — F422 Mixed obsessional thoughts and acts: Secondary | ICD-10-CM | POA: Diagnosis not present

## 2023-12-17 DIAGNOSIS — F331 Major depressive disorder, recurrent, moderate: Secondary | ICD-10-CM | POA: Diagnosis not present

## 2023-12-29 ENCOUNTER — Other Ambulatory Visit: Payer: Self-pay | Admitting: Family Medicine

## 2023-12-31 IMAGING — DX DG CHEST 2V
2 series · 2 of 2 positions shown · non-contrast
Comparison: September 12, 2019.

CLINICAL DATA: Persistent cough.

EXAM:
CHEST - 2 VIEW

[chest pa]
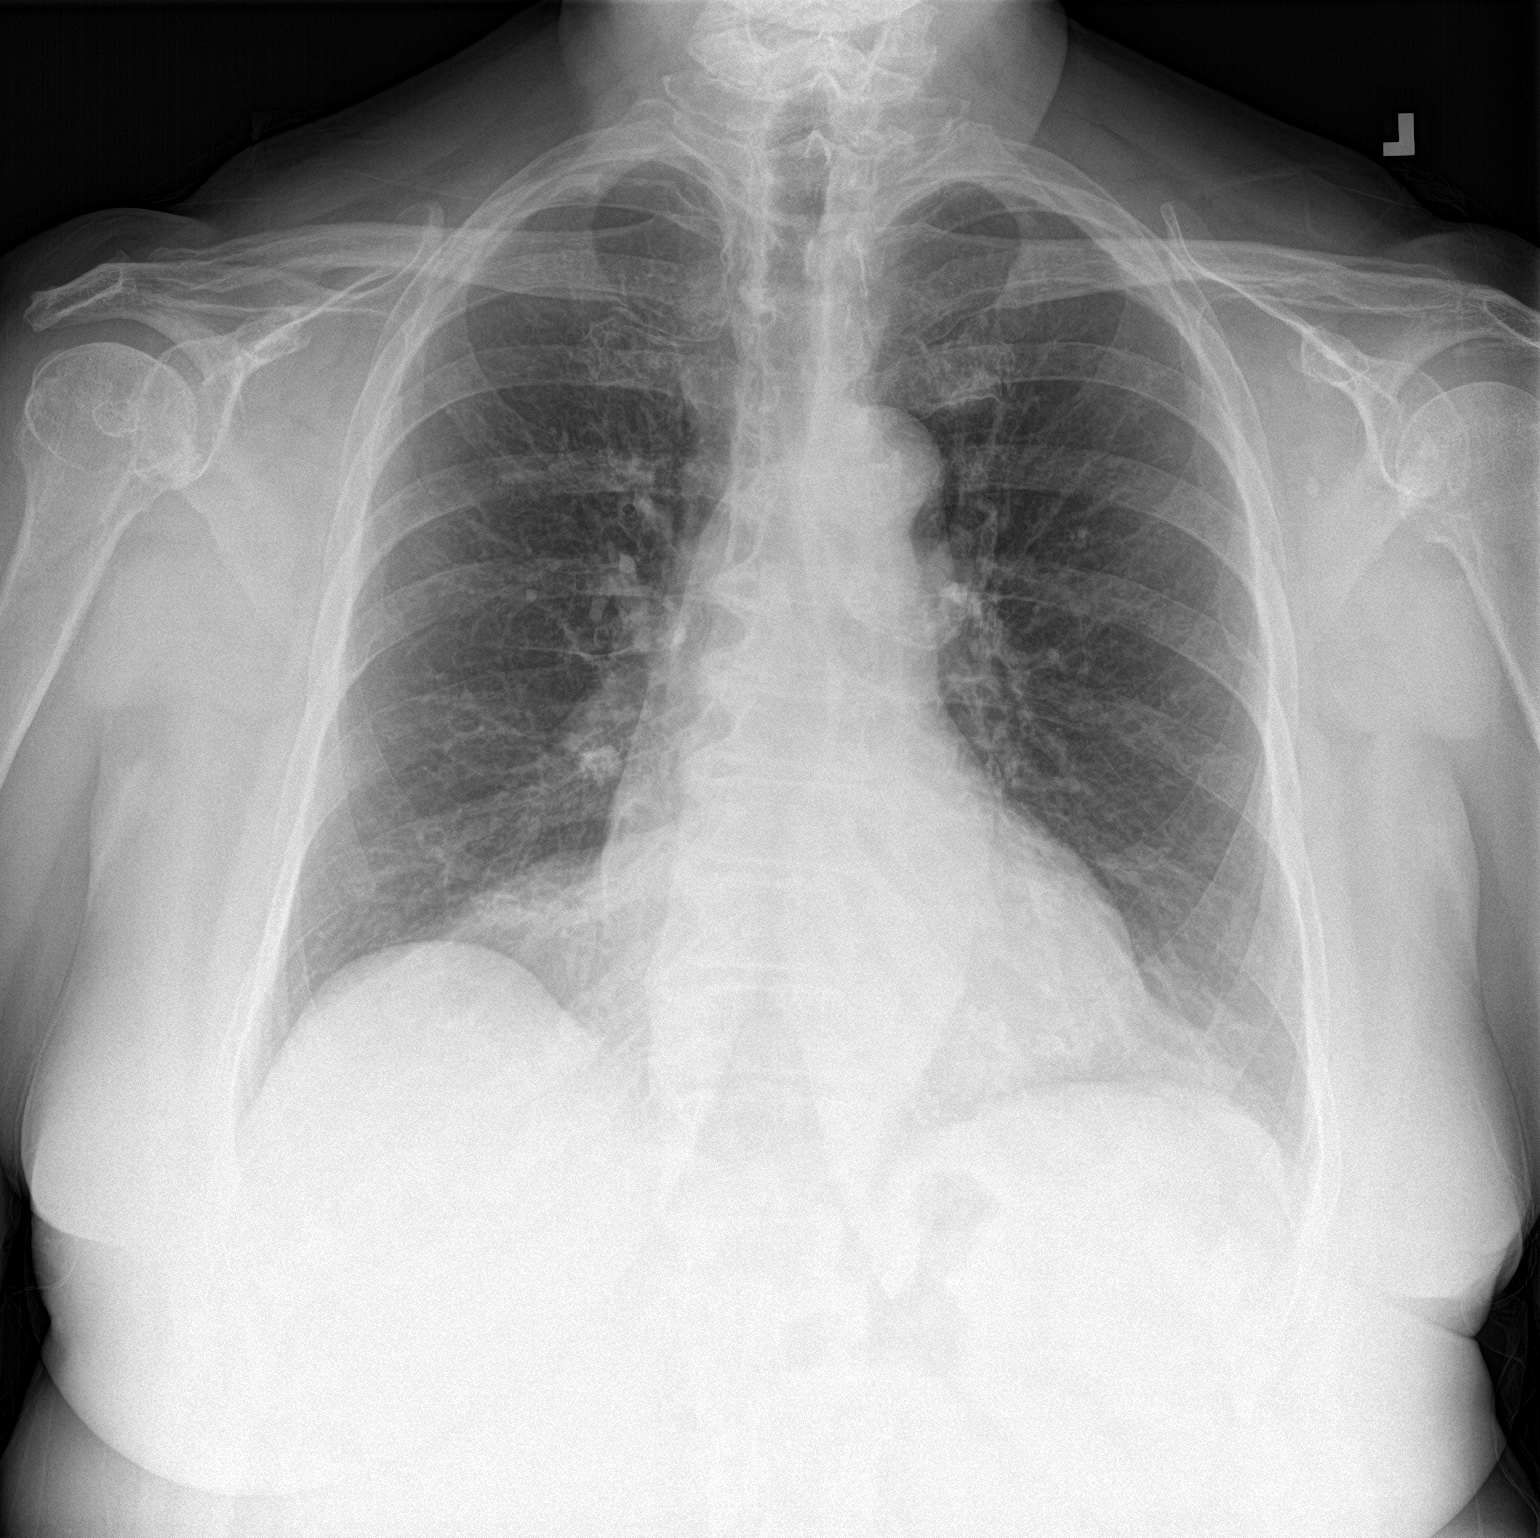

[chest lat]
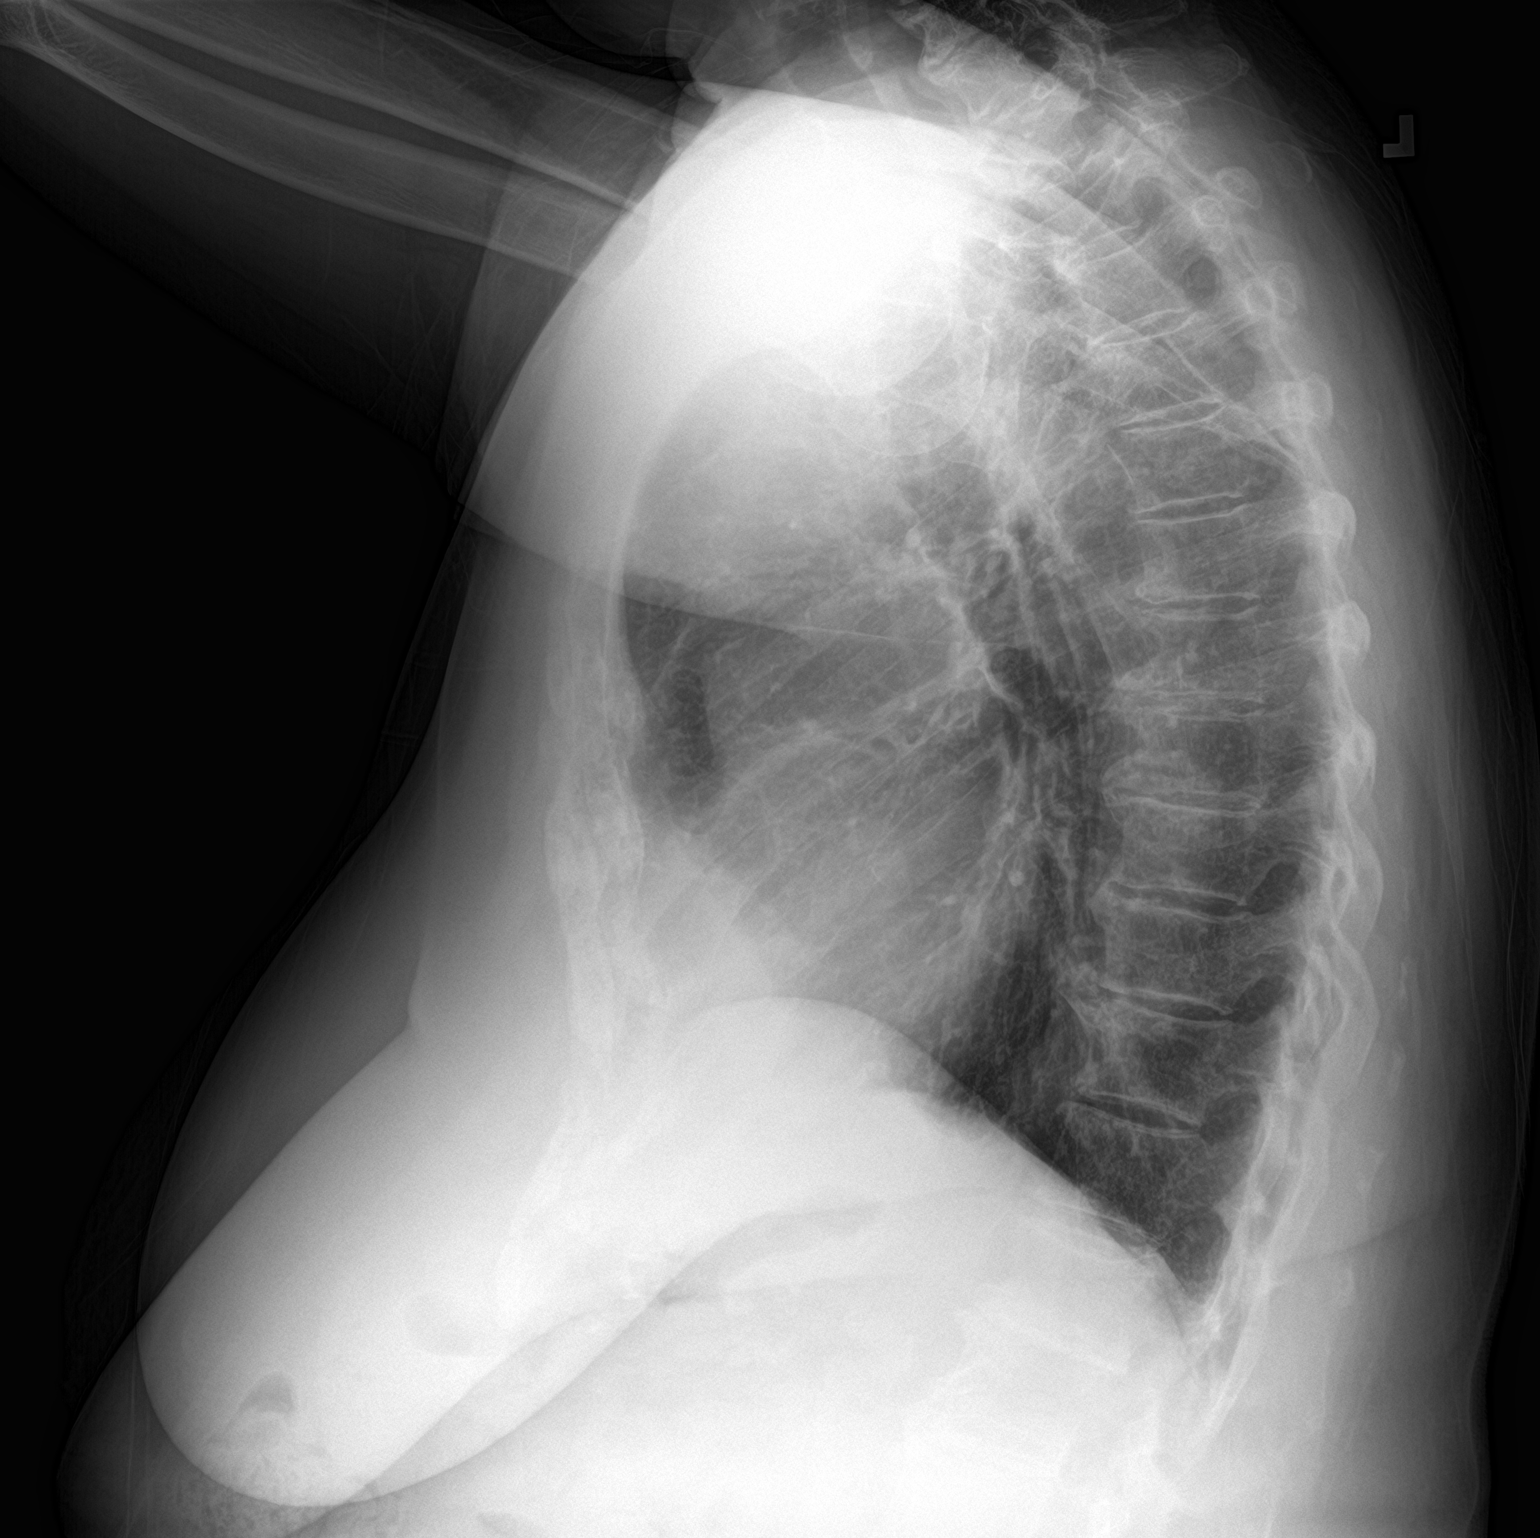

[2 of 2 positions shown; findings below may reference images not displayed]

FINDINGS: Stable cardiomediastinal silhouette. Both lungs are clear. The
visualized skeletal structures are unremarkable.
IMPRESSION: No active cardiopulmonary disease.

## 2024-01-14 DIAGNOSIS — F422 Mixed obsessional thoughts and acts: Secondary | ICD-10-CM | POA: Diagnosis not present

## 2024-01-14 DIAGNOSIS — F902 Attention-deficit hyperactivity disorder, combined type: Secondary | ICD-10-CM | POA: Diagnosis not present

## 2024-01-14 DIAGNOSIS — F331 Major depressive disorder, recurrent, moderate: Secondary | ICD-10-CM | POA: Diagnosis not present

## 2024-01-14 DIAGNOSIS — F410 Panic disorder [episodic paroxysmal anxiety] without agoraphobia: Secondary | ICD-10-CM | POA: Diagnosis not present

## 2024-01-29 ENCOUNTER — Ambulatory Visit

## 2024-02-13 ENCOUNTER — Encounter: Payer: Self-pay | Admitting: Family

## 2024-02-14 ENCOUNTER — Telehealth: Payer: Self-pay

## 2024-02-14 NOTE — Telephone Encounter (Signed)
 Copied from CRM [id number]. Topic: Clinical - Medical Advice >> Feb 14, 2024 11:18 AM Rosaria BRAVO wrote: Reason for CRM: Pt is seeking medical advice for a lingering cough   Best contact: [phone]

## 2024-02-17 ENCOUNTER — Ambulatory Visit: Admitting: Family Medicine

## 2024-02-17 ENCOUNTER — Encounter: Payer: Self-pay | Admitting: Family Medicine

## 2024-02-17 VITALS — BP 122/65 | HR 76 | Temp 98.2°F | Ht 62.0 in | Wt 182.0 lb

## 2024-02-17 DIAGNOSIS — R7301 Impaired fasting glucose: Secondary | ICD-10-CM | POA: Diagnosis not present

## 2024-02-17 DIAGNOSIS — J019 Acute sinusitis, unspecified: Secondary | ICD-10-CM | POA: Diagnosis not present

## 2024-02-17 DIAGNOSIS — E038 Other specified hypothyroidism: Secondary | ICD-10-CM | POA: Diagnosis not present

## 2024-02-17 DIAGNOSIS — M05741 Rheumatoid arthritis with rheumatoid factor of right hand without organ or systems involvement: Secondary | ICD-10-CM | POA: Diagnosis not present

## 2024-02-17 DIAGNOSIS — I10 Essential (primary) hypertension: Secondary | ICD-10-CM | POA: Diagnosis not present

## 2024-02-17 DIAGNOSIS — M05742 Rheumatoid arthritis with rheumatoid factor of left hand without organ or systems involvement: Secondary | ICD-10-CM | POA: Diagnosis not present

## 2024-02-17 DIAGNOSIS — K760 Fatty (change of) liver, not elsewhere classified: Secondary | ICD-10-CM | POA: Diagnosis not present

## 2024-02-17 DIAGNOSIS — R051 Acute cough: Secondary | ICD-10-CM

## 2024-02-17 LAB — POC SOFIA 2 FLU + SARS ANTIGEN FIA
Influenza A, POC: NEGATIVE
Influenza B, POC: NEGATIVE
SARS Coronavirus 2 Ag: NEGATIVE

## 2024-02-17 MED ORDER — AMOXICILLIN-POT CLAVULANATE 875-125 MG PO TABS: 1.0000 | ORAL_TABLET | Freq: Two times a day (BID) | ORAL | 0 refills | Status: DC

## 2024-02-17 NOTE — Progress Notes (Signed)
 "  Acute Office Visit  Patient ID: [name], female    DOB: 1945-12-27, 79 y.o.   MRN: [id number]  PCP: Alvan Dorothyann BIRCH, MD  Chief Complaint  Patient presents with   Cough   Nasal Congestion    Subjective:     HPI  Discussed the use of AI scribe software for clinical note transcription with the patient, who gave verbal consent to proceed.  History of Present Illness [name] is an 79 year old female who presents with flu-like symptoms and a persistent cough.  Upper respiratory symptoms - Flu-like symptoms for 10 days, onset after outing on December 23rd - Severe earache at onset, managed with heating pad - Change in voice described as gravelly and horrible - Persistent sore throat with sensation of tightness, not painful like strep throat - Difficulty swallowing due to sensation of lump in throat, attributed to swollen glands - No fever; temperature consistently between 97.5F and 77F - Significant sinus congestion with frequent nose blowing - Coughing up thick, purulent mucus  Cough - Persistent cough, difficult to manage with medication - Uses gel caps and occasionally a 'salon pearl' for relief  Constitutional symptoms - Feels very sick with body aches, neck pain, and exhaustion  Thyroid  disorder - Currently takes half tablet of thyroid  medication on Sundays - Last TSH level in July was 0.9  Arthritis - History of arthritis - Concerned that current symptoms may exacerbate arthritis  Past influenza infection - History of influenza type A and B in previous years   ROS     Objective:    BP 122/65   Pulse 76   Temp 98.2 F (36.8 C) (Oral)   Ht 5' 2 (1.575 m)   Wt 182 lb (82.6 kg)   SpO2 99%   BMI 33.29 kg/m    Physical Exam Constitutional:      Appearance: Normal appearance.  HENT:     Head: Normocephalic and atraumatic.     Right Ear: Tympanic membrane, ear canal and external ear normal. There is no impacted cerumen.      Left Ear: Tympanic membrane, ear canal and external ear normal. There is no impacted cerumen.     Nose: Nose normal.     Mouth/Throat:     Pharynx: Oropharynx is clear.  Eyes:     Conjunctiva/sclera: Conjunctivae normal.  Cardiovascular:     Rate and Rhythm: Normal rate and regular rhythm.  Pulmonary:     Effort: Pulmonary effort is normal.     Breath sounds: Normal breath sounds.  Musculoskeletal:     Cervical back: Neck supple. No tenderness.  Lymphadenopathy:     Cervical: No cervical adenopathy.  Skin:    General: Skin is warm and dry.  Neurological:     Mental Status: She is alert and oriented to person, place, and time.  Psychiatric:        Mood and Affect: Mood normal.       Results for orders placed or performed in visit on 02/17/24  POC SOFIA 2 FLU + SARS ANTIGEN FIA  Result Value Ref Range   Influenza A, POC Negative Negative   Influenza B, POC Negative Negative   SARS Coronavirus 2 Ag Negative Negative       Assessment & Plan:   Problem List Items Addressed This Visit       Cardiovascular and Mediastinum   Essential hypertension, benign   Well controlled on losartan  and HCTZ. Continue current regimen. Follow up in 6  months.          Digestive   Fatty liver     Endocrine   IFG (impaired fasting glucose)   Due for repeat A1C.        Relevant Orders   Hemoglobin A1c   Hypothyroidism   Relevant Orders   TSH     Musculoskeletal and Integument   Rheumatoid arthritis with rheumatoid factor (HCC) (Chronic)   Not currently on a DMARD and doesn't want to be on one.       Other Visit Diagnoses       Acute cough    -  Primary   Relevant Orders   POC SOFIA 2 FLU + SARS ANTIGEN FIA (Completed)     Acute non-recurrent sinusitis, unspecified location       Relevant Medications   amoxicillin -clavulanate (AUGMENTIN ) 875-125 MG tablet       Assessment and Plan Assessment & Plan Acute sinusitis with cough Likely viral etiology due to absence of  high fever and normal chest sounds. - Treated with antibiotics for sinusitis. - Confirmed negative flu swab before antibiotics.  Other specified hypothyroidism Thyroid  function well-managed with TSH at 0.9 in July.  Impaired fasting glucose Due for repeat A1c to monitor prediabetes status. - Ordered repeat A1c test.  Rheumatoid arthritis with rheumatoid factor involving both hands    Meds ordered this encounter  Medications   amoxicillin -clavulanate (AUGMENTIN ) 875-125 MG tablet    Sig: Take 1 tablet by mouth 2 (two) times daily.    Dispense:  14 tablet    Refill:  0    Return in about 6 months (around 08/16/2024) for Hypertension, Pre-diabetes, labs and thyroid  check up .  Dorothyann Byars, MD [facility]   "

## 2024-02-17 NOTE — Telephone Encounter (Signed)
 Patient scheduled.

## 2024-02-17 NOTE — Assessment & Plan Note (Signed)
 Not currently on a DMARD and doesn't want to be on one.

## 2024-02-17 NOTE — Assessment & Plan Note (Addendum)
 Well controlled on losartan  and HCTZ. Continue current regimen. Follow up in 6 months.

## 2024-02-17 NOTE — Assessment & Plan Note (Signed)
Due for repeat A1C .  °

## 2024-02-20 ENCOUNTER — Ambulatory Visit: Admitting: Family Medicine

## 2024-02-24 ENCOUNTER — Other Ambulatory Visit: Payer: Self-pay | Admitting: Family Medicine

## 2024-02-24 DIAGNOSIS — Z1231 Encounter for screening mammogram for malignant neoplasm of breast: Secondary | ICD-10-CM

## 2024-03-05 ENCOUNTER — Ambulatory Visit: Admitting: Adult Health

## 2024-03-05 ENCOUNTER — Encounter: Payer: Self-pay | Admitting: Adult Health

## 2024-03-05 ENCOUNTER — Encounter: Payer: Self-pay | Admitting: Family

## 2024-03-05 VITALS — BP 102/68 | HR 86 | Temp 97.8°F | Ht 62.0 in | Wt 184.4 lb

## 2024-03-05 DIAGNOSIS — Z87891 Personal history of nicotine dependence: Secondary | ICD-10-CM | POA: Diagnosis not present

## 2024-03-05 DIAGNOSIS — R059 Cough, unspecified: Secondary | ICD-10-CM | POA: Diagnosis not present

## 2024-03-05 DIAGNOSIS — J019 Acute sinusitis, unspecified: Secondary | ICD-10-CM

## 2024-03-05 DIAGNOSIS — J209 Acute bronchitis, unspecified: Secondary | ICD-10-CM

## 2024-03-05 DIAGNOSIS — Z23 Encounter for immunization: Secondary | ICD-10-CM

## 2024-03-05 DIAGNOSIS — J45909 Unspecified asthma, uncomplicated: Secondary | ICD-10-CM | POA: Diagnosis not present

## 2024-03-05 DIAGNOSIS — J309 Allergic rhinitis, unspecified: Secondary | ICD-10-CM

## 2024-03-05 DIAGNOSIS — J4530 Mild persistent asthma, uncomplicated: Secondary | ICD-10-CM

## 2024-03-05 NOTE — Patient Instructions (Addendum)
 Symbicort  2 puffs Twice daily,  rinse after use. May use with spacer.  Albuterol  inhaler As needed   Saline nasal rinses As needed   Zyrtec 10mg  daily  May use Flonase  1 puff each nare daily As needed   Saline nasal As needed   Mucinex  600mg   As needed   Delsym 1/2 -1  tsp Twice daily  As needed  Cough .  Tessalon  Three times a day  for cough As needed   Follow up Dr. Jude  or Malvika Tung NP 6 months and As needed   Flu shot today

## 2024-03-05 NOTE — Progress Notes (Signed)
 "  @Patient  ID: [name], female    DOB: November 21, 1945, 79 y.o.   MRN: [id number]  Chief Complaint  Patient presents with   Medical Management of Chronic Issues    2-3 m f/u    Referring provider: Alvan Dorothyann BIRCH, MD  HPI: 79 year old female with minimal smoking history followed for asthma, allergic rhinitis and chronic cough Medical history significant for rheumatoid arthritis, osteoarthritis previously on methotrexate, IDA    TEST/EVENTS : Reviewed 03/05/2024  Spirometry 06/2013 ratio 63 FEV1 normal   PFT 2025 normal   Eosinophils 11/2022 400   Chest xray 11/2023 Clear    Discussed the use of AI scribe software for clinical note transcription with the patient, who gave verbal consent to proceed.  History of Present Illness [name] is an 79 year old female who presents for a checkup following a recent illness.  Around New Year's Eve, she experienced flu-like symptoms, including significant voice loss and persistent coughing, without fever. Both flu and COVID tests were negative. She reports being prescribed amoxicillin  twice a day for seven days, which improved her condition but did not completely resolve her symptoms.  Currently, she has persistent coughing and mucus production, describing it as 'a lot of mucus' and is constantly clearing her throat. Her cough is more pronounced when she is getting ready for the day. She uses her rescue inhaler rarely. She has been using Delsym for the cough but experienced dizziness after taking it. Tessalon  Perles before bed helps reduce her coughing at night.  She has no discolored mucus.  No fever.  Appetite is good.  Feels like that she is on the tail end of this illness.  She has a history of earaches as a child and recently experienced ear pain, for which she used a heating pad. She did not receive a flu shot this year due to various personal events and concerns about getting sick.  She uses Zyrtec for allergies, which  is effective. She also uses Mucinex , having increased her dose to 1200 mg due to the mucus, but plans to return to 600 mg. She is on Symbicort , two puffs twice a day, but missed a dose once or twice recently.  She notes a weight gain of about eight pounds after a cruise, which she attributes to being homebound for two weeks due to her illness.     Allergies[1]  Immunization History  Administered Date(s) Administered   Fluad Quad(high Dose 65+) 12/24/2018, 12/28/2019, 12/14/2020, 01/29/2022   Fluad Trivalent(High Dose 65+) 12/20/2022   INFLUENZA, HIGH DOSE SEASONAL PF 11/19/2016, 12/10/2017, 03/05/2024   Influenza Split 11/07/2011, 01/12/2013   Influenza Whole 02/12/2005, 12/02/2007, 11/24/2008   Influenza,inj,Quad PF,6+ Mos 01/11/2015   Influenza-Unspecified 11/07/2011, 01/12/2013, 11/12/2013, 01/11/2015, 12/24/2018   PFIZER(Purple Top)SARS-COV-2 Vaccination 03/20/2019, 04/15/2019   Pneumococcal Conjugate-13 04/26/2014   Pneumococcal Polysaccharide-23 02/12/2001, 11/27/2001, 11/28/2006, 11/07/2011   Td 02/12/1998, 06/13/1998   Tdap 09/23/2012    Past Medical History:  Diagnosis Date   Allergic rhinitis, cause unspecified    Anemia    iron   Anxiety    Asthma    Benign paroxysmal positional vertigo    Cancer (HCC)    basal cell carcinoma per right side of nostril   CHF (congestive heart failure) (HCC) 11/09/2016   had 2+ edema in legs with anxiety and SOB   Complication of anesthesia    has 4  degenerative discs in neck.   Depressive disorder, not elsewhere classified    Esophageal reflux  History of hiatal hernia    Hypertension    Localized osteoarthrosis not specified whether primary or secondary, lower leg    Other and unspecified hyperlipidemia    Other diseases of vocal cords    Peptic ulcer, unspecified site, unspecified as acute or chronic, without mention of hemorrhage, perforation, or obstruction    Pneumonia    bilat pneumonia 1987   Pre-diabetes    no  meds,just diet controll   TMJ (temporomandibular joint disorder)    Unspecified arthropathy, hand    Unspecified asthma(493.90)    triggered with a virus    Unspecified hypothyroidism    Venous stasis ulcer of left lower extremity (HCC) 03/03/2015   By 1/17 biopsy      Tobacco History: Tobacco Use History[2] Counseling given: Not Answered   Outpatient Medications Prior to Visit  Medication Sig Dispense Refill   albuterol  (VENTOLIN  HFA) 108 (90 Base) MCG/ACT inhaler INHALE 2 PUFFS INTO THE LUNGS EVERY 6 HOURS AS NEEDED FOR SHORTNESS OF BREATH 8.5 each 2   Ascorbic Acid (VITAMIN C WITH ROSE HIPS) 1000 MG tablet Take 1,000 mg by mouth daily.     budesonide -formoterol  (SYMBICORT ) 80-4.5 MCG/ACT inhaler Inhale 2 puffs into the lungs in the morning and at bedtime. Using daily. 1 each 5   buPROPion  (WELLBUTRIN  XL) 300 MG 24 hr tablet Take 300 mg by mouth daily.     Calcium  Carb-Cholecalciferol (CALCIUM  600+D3 PO) Take 1 tablet by mouth 2 (two) times daily.     cetirizine (ZYRTEC) 10 MG tablet Take 10 mg by mouth at bedtime.     Cholecalciferol (VITAMIN D3) 50 MCG (2000 UT) capsule Take 2,000 Units by mouth daily.     clobetasol  ointment (TEMOVATE ) 0.05 % Apply 1 Application topically 2 (two) times daily as needed. 30 g 1   famotidine  (PEPCID ) 20 MG tablet Take 20 mg by mouth at bedtime.      folic acid  (FOLVITE ) 1 MG tablet Take 1 tablet (1 mg total) by mouth daily. 90 tablet 3   Ginkgo Biloba (GNP GINGKO BILOBA EXTRACT PO) Take 1 tablet by mouth 3 (three) times daily with meals.     hydrochlorothiazide  (HYDRODIURIL ) 25 MG tablet TAKE 1 TABLET BY MOUTH EVERY DAY 90 tablet 1   losartan  (COZAAR ) 50 MG tablet TAKE 1 TABLET BY MOUTH EVERY DAY 90 tablet 3   Magnesium  100 MG TABS Take 100 mg by mouth 2 (two) times a day.     meloxicam  (MOBIC ) 15 MG tablet TAKE 1 TABLET BY MOUTH EVERY DAY IN THE MORNING 90 tablet 0   metoprolol  succinate (TOPROL -XL) 25 MG 24 hr tablet Take 1 tablet by mouth daily.      MIEBO 1.338 GM/ML SOLN      OVER THE COUNTER MEDICATION Apply 1 Application topically 2 (two) times daily. CBD roll on pain reliever     PAXIL  CR 12.5 MG 24 hr tablet Take 12.5 mg by mouth daily.     polyethylene glycol (MIRALAX  / GLYCOLAX ) 17 g packet Take 17 g by mouth daily as needed for mild constipation. 14 each 0   Propylene Glycol, PF, (SYSTANE COMPLETE PF) 0.6 % SOLN Place 1 drop into both eyes 2 (two) times daily.     RESTASIS 0.05 % ophthalmic emulsion Place 1 drop into both eyes 2 (two) times daily.     SYNTHROID  75 MCG tablet TAKE 1 TABLET BY MOUTH DAILY BEFORE BREAKFAST. 90 tablet 0   OVER THE COUNTER MEDICATION Take 1 capsule by  mouth daily. (Patient not taking: Reported on 03/05/2024)     amoxicillin -clavulanate (AUGMENTIN ) 875-125 MG tablet Take 1 tablet by mouth 2 (two) times daily. 14 tablet 0   No facility-administered medications prior to visit.     Review of Systems:   Constitutional:   No  weight loss, night sweats,  Fevers, chills, fatigue, or  lassitude.  HEENT:   No headaches,  Difficulty swallowing,  Tooth/dental problems, or  Sore throat,                No sneezing, itching, ear ache,+ nasal congestion, post nasal drip,   CV:  No chest pain,  Orthopnea, PND, swelling in lower extremities, anasarca, dizziness, palpitations, syncope.   GI  No heartburn, indigestion, abdominal pain, nausea, vomiting, diarrhea, change in bowel habits, loss of appetite, bloody stools.   Resp:  No chest wall deformity  Skin: no rash or lesions.  GU: no dysuria, change in color of urine, no urgency or frequency.  No flank pain, no hematuria   MS:  No joint pain or swelling.  No decreased range of motion.  No back pain.    Physical Exam  BP 102/68   Pulse 86   Temp 97.8 F (36.6 C)   Ht 5' 2 (1.575 m) Comment: Per pt  Wt 184 lb 6.4 oz (83.6 kg)   SpO2 97% Comment: RA  BMI 33.73 kg/m   GEN: A/Ox3; pleasant , NAD, well nourished    HEENT:  [place]/AT, NOSE-clear  drainage, THROAT-clear, no lesions, no postnasal drip or exudate noted.   NECK:  Supple w/ fair ROM; no JVD; normal carotid impulses w/o bruits; no thyromegaly or nodules palpated; no lymphadenopathy.    RESP  Clear  P & A; w/o, wheezes/ rales/ or rhonchi. no accessory muscle use, no dullness to percussion  CARD:  RRR, no m/r/g, no peripheral edema, pulses intact, no cyanosis or clubbing.  GI:   Soft & nt; nml bowel sounds; no organomegaly or masses detected.   Musco: Warm bil, no deformities or joint swelling noted.   Neuro: alert, no focal deficits noted.    Skin: Warm, no lesions or rashes    Lab Results:Reviewed 03/05/2024   CBC   BMET   BNP   Imaging: No results found.  Administration History     None          Latest Ref Rng & Units 04/11/2023    2:42 PM  PFT Results  FVC-Pre L 2.36   FVC-Predicted Pre % 90   FVC-Post L 2.33   FVC-Predicted Post % 89   Pre FEV1/FVC % % 78   Post FEV1/FCV % % 82   FEV1-Pre L 1.83   FEV1-Predicted Pre % 95   FEV1-Post L 1.90   DLCO uncorrected ml/min/mmHg 16.16   DLCO UNC% % 87   DLCO corrected ml/min/mmHg 16.16   DLCO COR %Predicted % 87   DLVA Predicted % 103   TLC L 4.29   TLC % Predicted % 86   RV % Predicted % 73     No results found for: NITRICOXIDE      No data to display              Assessment & Plan:   Assessment and Plan Assessment & Plan Acute sinusitis/bronchitis -post infectious cough.   She experiences a persistent cough with significant mucus production and throat clearing after a respiratory infection. There is no fever or signs of pneumonia. She completed a  course of Augmentin , and symptoms are improving but not fully resolved. Delsym caused dizziness, likely due to inner ear issues. Continue saline nasal spray and Zyrtec. Use Flonase , one puff each side for a few days. Decrease  Delsym dose to half to one teaspoon .  Continue Tessalon  Perles as needed for cough.  Supportive care and  symptom management.  Asthma- recent flare now resolved .  Continue Symbicort , two puffs twice a day.  Allergic rhinitis   Allergic rhinitis is managed with Zyrtec. She reports no nasal congestion but has postnasal drainage. Continue Zyrtec. Use Flonase , one puff each side for a few days.  Flu shot today   Plan  Patient Instructions  Symbicort  2 puffs Twice daily,  rinse after use. May use with spacer.  Albuterol  inhaler As needed   Saline nasal rinses As needed   Zyrtec 10mg  daily  May use Flonase  1 puff each nare daily As needed   Saline nasal As needed   Mucinex  600mg   As needed   Delsym 1/2 -1  tsp Twice daily  As needed  Cough .  Tessalon  Three times a day  for cough As needed   Follow up Dr. Jude  or Naomy Esham NP 6 months and As needed   Flu shot today         Madelin Stank, NP 03/05/2024     [1]  Allergies Allergen Reactions   Moxifloxacin Other (See Comments)    confusion, dizziness, paranoia   Other     Black pepper -stomach pain   Astelin [Azelastine Hcl] Other (See Comments)    Headache   Celebrex  [Celecoxib ] Other (See Comments)    Stomach issues   Lisinopril  Cough   Methotrexate And Trimetrexate Other (See Comments)    Flu like symptoms    Morphine Nausea And Vomiting  [2]  Social History Tobacco Use  Smoking Status Former   Current packs/day: 0.00   Average packs/day: 0.3 packs/day for 2.0 years (0.6 ttl pk-yrs)   Types: Cigarettes   Start date: 02/13/1956   Quit date: 02/12/1958   Years since quitting: 66.1  Smokeless Tobacco Never   "

## 2024-03-17 ENCOUNTER — Other Ambulatory Visit: Payer: Self-pay | Admitting: Family Medicine

## 2024-04-23 ENCOUNTER — Ambulatory Visit

## 2024-05-04 ENCOUNTER — Ambulatory Visit: Admitting: Cardiology

## 2024-06-15 ENCOUNTER — Inpatient Hospital Stay

## 2024-06-15 ENCOUNTER — Inpatient Hospital Stay: Admitting: Family

## 2024-08-18 ENCOUNTER — Ambulatory Visit: Admitting: Family Medicine
